# Patient Record
Sex: Female | Born: 1965 | Race: White | Hispanic: No | Marital: Married | Smoking: Never smoker
Health system: Southern US, Community
[De-identification: ages and names within clinical notes are randomized; demographics above are authoritative.]

## PROBLEM LIST (undated history)

## (undated) DIAGNOSIS — Z950 Presence of cardiac pacemaker: Secondary | ICD-10-CM

## (undated) DIAGNOSIS — I5022 Chronic systolic (congestive) heart failure: Secondary | ICD-10-CM

## (undated) DIAGNOSIS — N183 Chronic kidney disease, stage 3 unspecified: Secondary | ICD-10-CM

## (undated) DIAGNOSIS — I219 Acute myocardial infarction, unspecified: Secondary | ICD-10-CM

## (undated) DIAGNOSIS — I10 Essential (primary) hypertension: Secondary | ICD-10-CM

## (undated) DIAGNOSIS — I2510 Atherosclerotic heart disease of native coronary artery without angina pectoris: Secondary | ICD-10-CM

## (undated) DIAGNOSIS — Z95810 Presence of automatic (implantable) cardiac defibrillator: Secondary | ICD-10-CM

## (undated) DIAGNOSIS — F32A Depression, unspecified: Secondary | ICD-10-CM

## (undated) DIAGNOSIS — R7303 Prediabetes: Secondary | ICD-10-CM

## (undated) DIAGNOSIS — E039 Hypothyroidism, unspecified: Secondary | ICD-10-CM

## (undated) DIAGNOSIS — I959 Hypotension, unspecified: Secondary | ICD-10-CM

## (undated) DIAGNOSIS — I255 Ischemic cardiomyopathy: Secondary | ICD-10-CM

## (undated) DIAGNOSIS — D649 Anemia, unspecified: Secondary | ICD-10-CM

## (undated) DIAGNOSIS — I480 Paroxysmal atrial fibrillation: Secondary | ICD-10-CM

## (undated) DIAGNOSIS — I493 Ventricular premature depolarization: Secondary | ICD-10-CM

## (undated) HISTORY — DX: Atherosclerotic heart disease of native coronary artery without angina pectoris: I25.10

## (undated) HISTORY — DX: Hypotension, unspecified: I95.9

## (undated) HISTORY — DX: Ischemic cardiomyopathy: I25.5

## (undated) HISTORY — DX: Ventricular premature depolarization: I49.3

## (undated) HISTORY — PX: THYROIDECTOMY: SHX17

## (undated) HISTORY — DX: Chronic systolic (congestive) heart failure: I50.22

## (undated) HISTORY — DX: Prediabetes: R73.03

---

## 2015-08-25 DIAGNOSIS — I219 Acute myocardial infarction, unspecified: Secondary | ICD-10-CM

## 2015-08-25 HISTORY — DX: Acute myocardial infarction, unspecified: I21.9

## 2015-09-20 ENCOUNTER — Inpatient Hospital Stay (HOSPITAL_COMMUNITY): Payer: BLUE CROSS/BLUE SHIELD

## 2015-09-20 ENCOUNTER — Ambulatory Visit (HOSPITAL_COMMUNITY): Admit: 2015-09-20 | Payer: Self-pay | Admitting: Cardiovascular Disease

## 2015-09-20 ENCOUNTER — Inpatient Hospital Stay (HOSPITAL_COMMUNITY)
Admission: EM | Admit: 2015-09-20 | Discharge: 2015-09-26 | DRG: 246 | Disposition: A | Discharge status: Home / Self Care | Payer: BLUE CROSS/BLUE SHIELD | Attending: Cardiovascular Disease | Admitting: Cardiovascular Disease

## 2015-09-20 ENCOUNTER — Encounter (HOSPITAL_COMMUNITY)
Admission: EM | Disposition: A | Discharge status: Home / Self Care | Payer: Self-pay | Source: Home / Self Care | Attending: Cardiovascular Disease

## 2015-09-20 ENCOUNTER — Encounter (HOSPITAL_COMMUNITY): Payer: Self-pay | Admitting: Cardiovascular Disease

## 2015-09-20 DIAGNOSIS — I2510 Atherosclerotic heart disease of native coronary artery without angina pectoris: Secondary | ICD-10-CM

## 2015-09-20 DIAGNOSIS — E039 Hypothyroidism, unspecified: Secondary | ICD-10-CM

## 2015-09-20 DIAGNOSIS — E890 Postprocedural hypothyroidism: Secondary | ICD-10-CM | POA: Diagnosis present

## 2015-09-20 DIAGNOSIS — I110 Hypertensive heart disease with heart failure: Secondary | ICD-10-CM | POA: Diagnosis present

## 2015-09-20 DIAGNOSIS — I2102 ST elevation (STEMI) myocardial infarction involving left anterior descending coronary artery: Principal | ICD-10-CM

## 2015-09-20 DIAGNOSIS — I480 Paroxysmal atrial fibrillation: Secondary | ICD-10-CM | POA: Diagnosis not present

## 2015-09-20 DIAGNOSIS — I420 Dilated cardiomyopathy: Secondary | ICD-10-CM | POA: Diagnosis present

## 2015-09-20 DIAGNOSIS — Z955 Presence of coronary angioplasty implant and graft: Secondary | ICD-10-CM

## 2015-09-20 DIAGNOSIS — I2109 ST elevation (STEMI) myocardial infarction involving other coronary artery of anterior wall: Secondary | ICD-10-CM | POA: Diagnosis present

## 2015-09-20 DIAGNOSIS — I5021 Acute systolic (congestive) heart failure: Secondary | ICD-10-CM | POA: Diagnosis present

## 2015-09-20 DIAGNOSIS — Z8249 Family history of ischemic heart disease and other diseases of the circulatory system: Secondary | ICD-10-CM | POA: Diagnosis not present

## 2015-09-20 DIAGNOSIS — I255 Ischemic cardiomyopathy: Secondary | ICD-10-CM | POA: Diagnosis present

## 2015-09-20 DIAGNOSIS — R0789 Other chest pain: Secondary | ICD-10-CM | POA: Diagnosis present

## 2015-09-20 DIAGNOSIS — E038 Other specified hypothyroidism: Secondary | ICD-10-CM | POA: Diagnosis not present

## 2015-09-20 DIAGNOSIS — I213 ST elevation (STEMI) myocardial infarction of unspecified site: Secondary | ICD-10-CM

## 2015-09-20 DIAGNOSIS — Z793 Long term (current) use of hormonal contraceptives: Secondary | ICD-10-CM

## 2015-09-20 HISTORY — DX: Hypothyroidism, unspecified: E03.9

## 2015-09-20 HISTORY — PX: CORONARY STENT PLACEMENT: SHX1402

## 2015-09-20 HISTORY — PX: CARDIAC CATHETERIZATION: SHX172

## 2015-09-20 HISTORY — DX: Paroxysmal atrial fibrillation: I48.0

## 2015-09-20 LAB — TROPONIN I
Troponin I: >65 ng/mL
Troponin I: >65 ng/mL (ref <0.03)
Troponin I: >65 ng/mL

## 2015-09-20 LAB — POCT ACTIVATED CLOTTING TIME: Activated Clotting Time: 703 seconds

## 2015-09-20 LAB — CBC WITH DIFFERENTIAL/PLATELET
Basophils Absolute: 0 10*3/uL (ref 0.0–0.1)
Basophils Relative: 0 %
Eosinophils Absolute: 0 10*3/uL (ref 0.0–0.7)
Eosinophils Relative: 0 %
HCT: 37.6 % (ref 36.0–46.0)
Hemoglobin: 12.4 g/dL (ref 12.0–15.0)
Lymphocytes Relative: 9 %
Lymphs Abs: 1.1 10*3/uL (ref 0.7–4.0)
MCH: 28.9 pg (ref 26.0–34.0)
MCHC: 33 g/dL (ref 30.0–36.0)
MCV: 87.6 fL (ref 78.0–100.0)
Monocytes Absolute: 0.3 10*3/uL (ref 0.1–1.0)
Monocytes Relative: 2 %
Neutro Abs: 11.7 10*3/uL — ABNORMAL HIGH (ref 1.7–7.7)
Neutrophils Relative %: 89 %
Platelets: 266 10*3/uL (ref 150–400)
RBC: 4.29 MIL/uL (ref 3.87–5.11)
RDW: 13 % (ref 11.5–15.5)
WBC: 13.1 10*3/uL — ABNORMAL HIGH (ref 4.0–10.5)

## 2015-09-20 LAB — ECHOCARDIOGRAM COMPLETE
Height: 69 in
Weight: 2571.45 [oz_av]

## 2015-09-20 LAB — CBC
HCT: 35.2 % — ABNORMAL LOW (ref 36.0–46.0)
Hemoglobin: 11.7 g/dL — ABNORMAL LOW (ref 12.0–15.0)
MCH: 29.3 pg (ref 26.0–34.0)
MCHC: 33.2 g/dL (ref 30.0–36.0)
MCV: 88.2 fL (ref 78.0–100.0)
Platelets: 258 10*3/uL (ref 150–400)
RBC: 3.99 MIL/uL (ref 3.87–5.11)
RDW: 13.3 % (ref 11.5–15.5)
WBC: 13.4 10*3/uL — ABNORMAL HIGH (ref 4.0–10.5)

## 2015-09-20 LAB — BASIC METABOLIC PANEL
Anion gap: 13 (ref 5–15)
BUN: 11 mg/dL (ref 6–20)
CO2: 20 mmol/L — ABNORMAL LOW (ref 22–32)
Calcium: 8.1 mg/dL — ABNORMAL LOW (ref 8.9–10.3)
Chloride: 103 mmol/L (ref 101–111)
Creatinine, Ser: 0.76 mg/dL (ref 0.44–1.00)
GFR calc Af Amer: >60 mL/min (ref >60)
GFR calc non Af Amer: >60 mL/min (ref >60)
Glucose, Bld: 193 mg/dL — ABNORMAL HIGH (ref 65–99)
Potassium: 3.5 mmol/L (ref 3.5–5.1)
Sodium: 136 mmol/L (ref 135–145)

## 2015-09-20 LAB — MRSA PCR SCREENING: MRSA by PCR: NEGATIVE

## 2015-09-20 LAB — PROTIME-INR
INR: 1.12
Prothrombin Time: 14.4 seconds (ref 11.4–15.2)

## 2015-09-20 SURGERY — LEFT HEART CATH AND CORONARY ANGIOGRAPHY
Anesthesia: LOCAL

## 2015-09-20 MED ORDER — TIROFIBAN (AGGRASTAT) BOLUS VIA INFUSION
INTRAVENOUS | Status: DC | PRN
  Administered 2015-09-20: 1815 ug via INTRAVENOUS

## 2015-09-20 MED ORDER — METOCLOPRAMIDE HCL 5 MG/ML IJ SOLN
10.0000 mg | Freq: Once | INTRAMUSCULAR | Status: AC
  Administered 2015-09-20: 10 mg via INTRAVENOUS
  Filled 2015-09-20: qty 2

## 2015-09-20 MED ORDER — TIROFIBAN HCL IN NACL 5-0.9 MG/100ML-% IV SOLN
0.1500 ug/kg/min | INTRAVENOUS | Status: AC
  Administered 2015-09-20 (×2): 0.15 ug/kg/min via INTRAVENOUS
  Filled 2015-09-20 (×2): qty 100

## 2015-09-20 MED ORDER — FENTANYL CITRATE (PF) 100 MCG/2ML IJ SOLN
INTRAMUSCULAR | Status: AC
  Filled 2015-09-20: qty 2

## 2015-09-20 MED ORDER — FENTANYL CITRATE (PF) 100 MCG/2ML IJ SOLN
INTRAMUSCULAR | Status: DC | PRN
  Administered 2015-09-20 (×2): 25 ug via INTRAVENOUS

## 2015-09-20 MED ORDER — SODIUM CHLORIDE 0.9 % IV SOLN
INTRAVENOUS | Status: AC
  Administered 2015-09-20: 08:00:00 via INTRAVENOUS

## 2015-09-20 MED ORDER — SODIUM CHLORIDE 0.9% FLUSH: 3.0000 mL | INTRAVENOUS | Status: DC | PRN

## 2015-09-20 MED ORDER — LORAZEPAM 2 MG/ML IJ SOLN
0.5000 mg | Freq: Once | INTRAMUSCULAR | Status: AC
  Administered 2015-09-20: 0.5 mg via INTRAVENOUS

## 2015-09-20 MED ORDER — VERAPAMIL HCL 2.5 MG/ML IV SOLN
INTRAVENOUS | Status: AC
  Filled 2015-09-20: qty 2

## 2015-09-20 MED ORDER — ATORVASTATIN CALCIUM 80 MG PO TABS
80.0000 mg | ORAL_TABLET | Freq: Every day | ORAL | Status: DC
  Administered 2015-09-20 – 2015-09-25 (×6): 80 mg via ORAL
  Filled 2015-09-20 (×6): qty 1

## 2015-09-20 MED ORDER — NITROGLYCERIN 1 MG/10 ML FOR IR/CATH LAB
INTRA_ARTERIAL | Status: DC | PRN
  Administered 2015-09-20: 200 ug via INTRACORONARY

## 2015-09-20 MED ORDER — TICAGRELOR 90 MG PO TABS
90.0000 mg | ORAL_TABLET | Freq: Two times a day (BID) | ORAL | Status: DC
  Administered 2015-09-20 – 2015-09-26 (×12): 90 mg via ORAL
  Filled 2015-09-20 (×12): qty 1

## 2015-09-20 MED ORDER — SODIUM CHLORIDE 0.9% FLUSH
3.0000 mL | Freq: Two times a day (BID) | INTRAVENOUS | Status: DC
  Administered 2015-09-20 – 2015-09-25 (×10): 3 mL via INTRAVENOUS

## 2015-09-20 MED ORDER — HEPARIN (PORCINE) IN NACL 2-0.9 UNIT/ML-% IJ SOLN
INTRAMUSCULAR | Status: DC | PRN
  Administered 2015-09-20: 10 mL via INTRA_ARTERIAL

## 2015-09-20 MED ORDER — MORPHINE SULFATE (PF) 4 MG/ML IV SOLN
4.0000 mg | Freq: Once | INTRAVENOUS | Status: AC
  Administered 2015-09-20: 4 mg via INTRAVENOUS
  Filled 2015-09-20: qty 1

## 2015-09-20 MED ORDER — BIVALIRUDIN 250 MG IV SOLR
INTRAVENOUS | Status: AC
  Filled 2015-09-20: qty 250

## 2015-09-20 MED ORDER — IOPAMIDOL (ISOVUE-370) INJECTION 76%
INTRAVENOUS | Status: AC
  Filled 2015-09-20: qty 125

## 2015-09-20 MED ORDER — TICAGRELOR 90 MG PO TABS
ORAL_TABLET | ORAL | Status: DC | PRN
  Administered 2015-09-20: 180 mg via ORAL

## 2015-09-20 MED ORDER — TIROFIBAN HCL IN NACL 5-0.9 MG/100ML-% IV SOLN
INTRAVENOUS | Status: AC
  Filled 2015-09-20: qty 100

## 2015-09-20 MED ORDER — SODIUM CHLORIDE 0.9 % IV SOLN
INTRAVENOUS | Status: DC | PRN
  Administered 2015-09-20: 1.75 mg/kg/h via INTRAVENOUS

## 2015-09-20 MED ORDER — LISINOPRIL 2.5 MG PO TABS
2.5000 mg | ORAL_TABLET | Freq: Every day | ORAL | Status: DC
  Administered 2015-09-20 – 2015-09-23 (×4): 2.5 mg via ORAL
  Filled 2015-09-20 (×5): qty 1

## 2015-09-20 MED ORDER — HEPARIN (PORCINE) IN NACL 2-0.9 UNIT/ML-% IJ SOLN
INTRAMUSCULAR | Status: DC | PRN
  Administered 2015-09-20: 07:00:00

## 2015-09-20 MED ORDER — MIDAZOLAM HCL 2 MG/2ML IJ SOLN
INTRAMUSCULAR | Status: AC
  Filled 2015-09-20: qty 2

## 2015-09-20 MED ORDER — OXYCODONE-ACETAMINOPHEN 5-325 MG PO TABS
1.0000 | ORAL_TABLET | ORAL | Status: DC | PRN
  Filled 2015-09-20: qty 2

## 2015-09-20 MED ORDER — PERFLUTREN LIPID MICROSPHERE
INTRAVENOUS | Status: AC
Start: 2015-09-20 — End: 2015-09-20
  Administered 2015-09-20: 13:00:00
  Filled 2015-09-20: qty 10

## 2015-09-20 MED ORDER — ACETAMINOPHEN 325 MG PO TABS
650.0000 mg | ORAL_TABLET | ORAL | Status: DC | PRN
  Administered 2015-09-21 – 2015-09-25 (×7): 650 mg via ORAL
  Filled 2015-09-20 (×7): qty 2

## 2015-09-20 MED ORDER — CARVEDILOL 3.125 MG PO TABS
3.1250 mg | ORAL_TABLET | Freq: Two times a day (BID) | ORAL | Status: DC
  Administered 2015-09-20 – 2015-09-21 (×3): 3.125 mg via ORAL
  Filled 2015-09-20 (×3): qty 1

## 2015-09-20 MED ORDER — FENTANYL CITRATE (PF) 100 MCG/2ML IJ SOLN
25.0000 ug | INTRAMUSCULAR | Status: DC | PRN
  Administered 2015-09-20: 25 ug via INTRAVENOUS
  Filled 2015-09-20 (×2): qty 2

## 2015-09-20 MED ORDER — LORAZEPAM 2 MG/ML IJ SOLN
INTRAMUSCULAR | Status: AC
  Filled 2015-09-20: qty 1

## 2015-09-20 MED ORDER — IOPAMIDOL (ISOVUE-370) INJECTION 76%
INTRAVENOUS | Status: AC
  Filled 2015-09-20: qty 100

## 2015-09-20 MED ORDER — PERFLUTREN LIPID MICROSPHERE
1.0000 mL | INTRAVENOUS | Status: AC | PRN
  Administered 2015-09-20: 2 mL via INTRAVENOUS
  Filled 2015-09-20: qty 10

## 2015-09-20 MED ORDER — SODIUM CHLORIDE 0.9 % IV SOLN: 250.0000 mL | INTRAVENOUS | Status: DC | PRN

## 2015-09-20 MED ORDER — HEPARIN (PORCINE) IN NACL 2-0.9 UNIT/ML-% IJ SOLN
INTRAMUSCULAR | Status: DC | PRN
  Administered 2015-09-20: 1500 mL

## 2015-09-20 MED ORDER — GI COCKTAIL ~~LOC~~
30.0000 mL | Freq: Once | ORAL | Status: AC
  Administered 2015-09-20: 30 mL via ORAL
  Filled 2015-09-20: qty 30

## 2015-09-20 MED ORDER — IOPAMIDOL (ISOVUE-370) INJECTION 76%
INTRAVENOUS | Status: DC | PRN
  Administered 2015-09-20: 165 mL via INTRAVENOUS

## 2015-09-20 MED ORDER — LIDOCAINE HCL (PF) 1 % IJ SOLN
INTRAMUSCULAR | Status: DC | PRN
  Administered 2015-09-20: 2 mL

## 2015-09-20 MED ORDER — MORPHINE SULFATE (PF) 2 MG/ML IV SOLN
INTRAVENOUS | Status: AC
  Filled 2015-09-20: qty 1

## 2015-09-20 MED ORDER — BIVALIRUDIN BOLUS VIA INFUSION - CUPID
INTRAVENOUS | Status: DC | PRN
  Administered 2015-09-20: 54.45 mg via INTRAVENOUS

## 2015-09-20 MED ORDER — NITROGLYCERIN IN D5W 200-5 MCG/ML-% IV SOLN: 0.0000 ug/min | Freq: Once | INTRAVENOUS | Status: DC

## 2015-09-20 MED ORDER — IOPAMIDOL (ISOVUE-370) INJECTION 76%
INTRAVENOUS | Status: AC
  Filled 2015-09-20: qty 50

## 2015-09-20 MED ORDER — LORAZEPAM 2 MG/ML IJ SOLN
0.5000 mg | Freq: Once | INTRAMUSCULAR | Status: AC
  Administered 2015-09-20: 0.5 mg via INTRAVENOUS
  Filled 2015-09-20: qty 1

## 2015-09-20 MED ORDER — ASPIRIN 81 MG PO CHEW
81.0000 mg | CHEWABLE_TABLET | Freq: Every day | ORAL | Status: DC
  Administered 2015-09-21 – 2015-09-26 (×6): 81 mg via ORAL
  Filled 2015-09-20 (×6): qty 1

## 2015-09-20 MED ORDER — TIROFIBAN HCL IN NACL 5-0.9 MG/100ML-% IV SOLN
INTRAVENOUS | Status: DC | PRN
  Administered 2015-09-20: 0.15 ug/kg/min via INTRAVENOUS

## 2015-09-20 MED ORDER — ONDANSETRON HCL 4 MG/2ML IJ SOLN
4.0000 mg | Freq: Four times a day (QID) | INTRAMUSCULAR | Status: DC | PRN
  Administered 2015-09-20: 4 mg via INTRAVENOUS
  Filled 2015-09-20: qty 2

## 2015-09-20 MED ORDER — MORPHINE SULFATE (PF) 2 MG/ML IV SOLN
2.0000 mg | INTRAVENOUS | Status: DC | PRN
  Administered 2015-09-20 (×2): 2 mg via INTRAVENOUS
  Filled 2015-09-20: qty 1

## 2015-09-20 MED ORDER — MIDAZOLAM HCL 2 MG/2ML IJ SOLN
INTRAMUSCULAR | Status: DC | PRN
  Administered 2015-09-20 (×2): 1 mg via INTRAVENOUS

## 2015-09-20 MED ORDER — HEPARIN (PORCINE) IN NACL 2-0.9 UNIT/ML-% IJ SOLN
INTRAMUSCULAR | Status: AC
  Filled 2015-09-20: qty 1500

## 2015-09-20 MED ORDER — NITROGLYCERIN 1 MG/10 ML FOR IR/CATH LAB
INTRA_ARTERIAL | Status: AC
  Filled 2015-09-20: qty 10

## 2015-09-20 MED ORDER — MORPHINE SULFATE (PF) 2 MG/ML IV SOLN
INTRAVENOUS | Status: AC
  Administered 2015-09-20: 4 mg via INTRAVENOUS
  Filled 2015-09-20: qty 2

## 2015-09-20 MED ORDER — TICAGRELOR 90 MG PO TABS
ORAL_TABLET | ORAL | Status: AC
  Filled 2015-09-20: qty 2

## 2015-09-20 SURGICAL SUPPLY — 19 items
BALLN EMERGE MR 2.5X12 (BALLOONS) ×2
BALLN ~~LOC~~ EMERGE MR 3.75X12 (BALLOONS) ×2
BALLOON EMERGE MR 2.5X12 (BALLOONS) ×1 IMPLANT
BALLOON ~~LOC~~ EMERGE MR 3.75X12 (BALLOONS) ×1 IMPLANT
CATH INFINITI 5FR ANG PIGTAIL (CATHETERS) ×2 IMPLANT
CATH INFINITI JR4 5F (CATHETERS) ×2 IMPLANT
CATH VISTA GUIDE 6FR XBLAD3.5 (CATHETERS) ×2 IMPLANT
DEVICE RAD COMP TR BAND LRG (VASCULAR PRODUCTS) ×2 IMPLANT
GLIDESHEATH SLEND SS 6F .021 (SHEATH) ×2 IMPLANT
KIT ENCORE 26 ADVANTAGE (KITS) ×2 IMPLANT
KIT HEART LEFT (KITS) ×2 IMPLANT
PACK CARDIAC CATHETERIZATION (CUSTOM PROCEDURE TRAY) ×2 IMPLANT
STENT SYNERGY DES 3.5X16 (Permanent Stent) ×2 IMPLANT
SYR MEDRAD MARK V 150ML (SYRINGE) ×2 IMPLANT
TRANSDUCER W/STOPCOCK (MISCELLANEOUS) ×2 IMPLANT
TUBING CIL FLEX 10 FLL-RA (TUBING) ×2 IMPLANT
WIRE COUGAR XT STRL 190CM (WIRE) ×4 IMPLANT
WIRE HI TORQ WHISPER MS 190CM (WIRE) ×2 IMPLANT
WIRE SAFE-T 1.5MM-J .035X260CM (WIRE) ×2 IMPLANT

## 2015-09-20 NOTE — Progress Notes (Signed)
Notified Lillia Abed of patient c/o continuing chest pain, N/V and elevated Troponin >65.  Dr. Clifton James already aware of CP post procedure.  Lindsay at bedside new orders given and initiated.  Will continue to monitor and notify of any further changes.

## 2015-09-20 NOTE — H&P (Signed)
     Patient ID: Catherine Mays MRN: [id number] DOB/AGE: [birth year] 50 y.o. Admit date: 09/20/2015  Primary Care Physician:VASIREDDY,SABITHA, MD Primary Cardiologist: new  HPI: 50 yo female with no prior cardiac history presented to [facility] in [place] with c/o upper chest pain/arm pain, nausea and vomiting about 8 hours ago. Her pain was felt to be GI related. Troponin was negative. No EKG to review from that visit. She then went home and with continued chest pain, came back to the [facility] ED and EKG showed ST elevation in the lateral and precordial leads. Code STeMI activated and pt transported to [facility] for emergent cath with ongoing chest pain, although improved with morphine.   Review of systems complete and found to be negative unless listed above   Past Medical History:  Diagnosis Date  . Thyroid disease     Family History  Problem Relation Age of Onset  . Heart attack Father     Social History   Social History  . Marital status: Married    Spouse name: N/A  . Number of children: N/A  . Years of education: N/A   Occupational History  . Not on file.   Social History Main Topics  . Smoking status: Never Smoker  . Smokeless tobacco: Not on file  . Alcohol use No  . Drug use: No  . Sexual activity: Not on file   Other Topics Concern  . Not on file   Social History Narrative  . No narrative on file    Past Surgical History:  Procedure Laterality Date  . THYROIDECTOMY      No Known Allergies  Prior to Admission Meds:  Prior to Admission medications   Not on File   Synthroid 125 mcg po daily  Physical Exam: Height 5\' 9"  (1.753 m), weight 160 lb (72.6 kg).    General: Well developed, well nourished, NAD  HEENT: OP clear, mucus membranes moist  SKIN: warm, dry. No rashes.  Neuro: No focal deficits  Musculoskeletal: Muscle strength 5/5 all ext  Psychiatric: Mood and affect normal  Neck: No JVD, no carotid bruits, no thyromegaly, no lymphadenopathy.   Lungs:Clear bilaterally, no wheezes, rhonci, crackles  Cardiovascular: Regular rate and rhythm. No murmurs, gallops or rubs.  Abdomen:Soft. Bowel sounds present. Non-tender.  Extremities: No lower extremity edema. Pulses are 2 + in the bilateral DP/PT.   Labs: pending    EKG: sinus, rate 97 bpm. 2-3 mm ST elevation in leads 1, AVL, V2, V3, V4, V5.   ASSESSMENT AND PLAN:   1. Acute anterolateral STEMI: emergent cardiac cath. Further plans to follow after cath.   Earney Hamburg, MD 09/20/2015, 6:21 AM

## 2015-09-20 NOTE — ED Provider Notes (Addendum)
[facility]-EMERGENCY DEPT Provider Note   CSN: [id number] Arrival date & time: 09/20/15  4259  First Provider Contact:  None       History   Chief Complaint Chief Complaint  Patient presents with  . Code STEMI    HPI Catherine Mays is a 50 y.o. female.  HPI  This is a 50 year old female with history of hypertension and early family history of heart disease who presents as a transfer for from [facility] as a code STEMI. Patient is currently having chest pain 7 out of 10. At its worse it was 9 out of 10. She is on a heparin drip and has Nitropaste applied.  EKG from outside hospital reviewed. Anterior lateral ST elevation with ST depressions inferiorly noted. Patient is having ongoing pain.  Denies shortness of breath.  She has received aspirin and a 4000 unit bolus of heparin.  Level V caveat for acuity of condition  No past medical history on file.  There are no active problems to display for this patient.   No past surgical history on file.  OB History    No data available       Home Medications    Prior to Admission medications   Not on File    Family History No family history on file.  Social History Social History  Substance Use Topics  . Smoking status: Not on file  . Smokeless tobacco: Not on file  . Alcohol use Not on file     Allergies   Review of patient's allergies indicates no known allergies.   Review of Systems Review of Systems  Respiratory: Negative for shortness of breath.   Cardiovascular: Positive for chest pain.     Physical Exam Updated Vital Signs Ht  (1.753 m)   Wt 160 lb (72.6 kg)   BMI 23.63 kg/m   Physical Exam  Constitutional: She is oriented to person, place, and time. She appears well-developed and well-nourished. No distress.  Anxious appearing  HENT:  Head: Normocephalic and atraumatic.  Cardiovascular: Normal rate, regular rhythm, normal heart sounds and intact distal pulses.   Pulmonary/Chest:  Effort normal. No respiratory distress.  Neurological: She is alert and oriented to person, place, and time.  Skin: Skin is warm and dry.  Psychiatric: She has a normal mood and affect.  Nursing note and vitals reviewed.    ED Treatments / Results  Labs (all labs ordered are listed, but only abnormal results are displayed) Labs Reviewed  CBC WITH DIFFERENTIAL/PLATELET  BASIC METABOLIC PANEL  Beverlee Nims, ED    EKG  EKG Interpretation  Date/Time:  Thursday September 20 2015 05:59:44 EDT Ventricular Rate:  94 PR Interval:    QRS Duration: 102 QT Interval:  396 QTC Calculation: 496 R Axis:   -46 Text Interpretation:  Atrial fibrillation Left anterior fascicular block Anterolateral infarct, acute (LAD) Confirmed by HORTON  MD, COURTNEY ([id number]) on 09/20/2015 6:19:48 AM       Radiology No results found.  Procedures Procedures (including critical care time)  CRITICAL CARE Performed by: Shon Baton   Total critical care time: 15 minutes  Critical care time was exclusive of separately billable procedures and treating other patients.  Critical care was necessary to treat or prevent imminent or life-threatening deterioration.  Critical care was time spent personally by me on the following activities: development of treatment plan with patient and/or surrogate as well as nursing, discussions with consultants, evaluation of patient's response to treatment, examination of  patient, obtaining history from patient or surrogate, ordering and performing treatments and interventions, ordering and review of laboratory studies, ordering and review of radiographic studies, pulse oximetry and re-evaluation of patient's condition.   Medications Ordered in ED Medications  nitroGLYCERIN 50 mg in dextrose 5 % 250 mL (0.2 mg/mL) infusion (not administered)  morphine 4 MG/ML injection 4 mg (not administered)  LORazepam (ATIVAN) injection 0.5 mg (not administered)    LORazepam (ATIVAN) 2 MG/ML injection (not administered)  morphine 2 MG/ML injection (not administered)     Initial Impression / Assessment and Plan / ED Course  I have reviewed the triage vital signs and the nursing notes.  Pertinent labs & imaging results that were available during my care of the patient were reviewed by me and considered in my medical decision making (see chart for details).  Clinical Course    Patient presents as a transfer as a code STEMI. Continuing pain. Patient given morphine and Ativan. Nitroglycerin drip ordered. Patient taken emergently to the Cath Lab for cardiac catheterization.  Final Clinical Impressions(s) / ED Diagnoses   Final diagnoses:  ST elevation myocardial infarction (STEMI), unspecified artery [facility])    New Prescriptions New Prescriptions   No medications on file     Shon Baton, MD 09/20/15 6004    Shon Baton, MD 09/20/15 [phone]

## 2015-09-20 NOTE — ED Notes (Signed)
Dr Wilkie Aye aware of troponin, pt is in cathlab.

## 2015-09-20 NOTE — Progress Notes (Signed)
     Paged about patient continuing to have chest pain, along with nausea an vomiting post PCI. In talking with the patient she reports her pain has improved post PCI, no longer having right arm pain, but remains nauseated/vomited a couple of times with burning up into her throat. Will repeat EKG. Reglan x1, morphine changed to fentanyl, GI cocktail x1, and ativan 0.5mg  x1. Nursing staff informed of orders.    Laverda Page NP-C

## 2015-09-20 NOTE — ED Notes (Signed)
Patient taken to the cath lab 

## 2015-09-20 NOTE — ED Triage Notes (Signed)
Patient arrived via EMS from [facility].  Patient went to the hospital c/o continued chest pain.  Stated the Percocet that was given to her made her sick.  An EKG was done and was noted to be having a STEMI

## 2015-09-20 NOTE — ED Notes (Signed)
Patient received Heparin 4000 units and continuous drip at 1000 units/hr, 1 SL NTG and 1" NTG paste to left upper arm by [facility] and received Zofran 4mg  and 1 SL NTG by EMS

## 2015-09-20 NOTE — Progress Notes (Signed)
  Echocardiogram 2D Echocardiogram with Definity has been performed.  Catherine Mays 09/20/2015, 1:50 PM

## 2015-09-21 ENCOUNTER — Encounter (HOSPITAL_COMMUNITY): Payer: Self-pay | Admitting: Cardiovascular Disease

## 2015-09-21 ENCOUNTER — Encounter (HOSPITAL_COMMUNITY)
Admission: EM | Disposition: A | Discharge status: Home / Self Care | Payer: Self-pay | Source: Home / Self Care | Attending: Cardiovascular Disease

## 2015-09-21 HISTORY — PX: CARDIAC CATHETERIZATION: SHX172

## 2015-09-21 LAB — CBC
HCT: 36.1 % (ref 36.0–46.0)
HCT: 37 % (ref 36.0–46.0)
Hemoglobin: 12 g/dL (ref 12.0–15.0)
Hemoglobin: 11.9 g/dL — ABNORMAL LOW (ref 12.0–15.0)
MCH: 29.3 pg (ref 26.0–34.0)
MCH: 28.9 pg (ref 26.0–34.0)
MCHC: 33.2 g/dL (ref 30.0–36.0)
MCHC: 32.2 g/dL (ref 30.0–36.0)
MCV: 88 fL (ref 78.0–100.0)
MCV: 89.8 fL (ref 78.0–100.0)
Platelets: 256 10*3/uL (ref 150–400)
Platelets: 230 10*3/uL (ref 150–400)
RBC: 4.1 MIL/uL (ref 3.87–5.11)
RBC: 4.12 MIL/uL (ref 3.87–5.11)
RDW: 13.4 % (ref 11.5–15.5)
RDW: 13.4 % (ref 11.5–15.5)
WBC: 13.9 10*3/uL — ABNORMAL HIGH (ref 4.0–10.5)
WBC: 15.8 10*3/uL — ABNORMAL HIGH (ref 4.0–10.5)

## 2015-09-21 LAB — BASIC METABOLIC PANEL
Anion gap: 9 (ref 5–15)
BUN: 14 mg/dL (ref 6–20)
CO2: 22 mmol/L (ref 22–32)
Calcium: 7.8 mg/dL — ABNORMAL LOW (ref 8.9–10.3)
Chloride: 103 mmol/L (ref 101–111)
Creatinine, Ser: 0.82 mg/dL (ref 0.44–1.00)
GFR calc Af Amer: >60 mL/min (ref >60)
GFR calc non Af Amer: >60 mL/min (ref >60)
Glucose, Bld: 144 mg/dL — ABNORMAL HIGH (ref 65–99)
Potassium: 3.8 mmol/L (ref 3.5–5.1)
Sodium: 134 mmol/L — ABNORMAL LOW (ref 135–145)

## 2015-09-21 LAB — HEPATIC FUNCTION PANEL
ALT: 74 U/L — ABNORMAL HIGH (ref 14–54)
AST: 310 U/L — ABNORMAL HIGH (ref 15–41)
Albumin: 3.2 g/dL — ABNORMAL LOW (ref 3.5–5.0)
Alkaline Phosphatase: 55 U/L (ref 38–126)
Bilirubin, Direct: 0.1 mg/dL (ref 0.1–0.5)
Indirect Bilirubin: 0.6 mg/dL (ref 0.3–0.9)
Total Bilirubin: 0.7 mg/dL (ref 0.3–1.2)
Total Protein: 6.4 g/dL — ABNORMAL LOW (ref 6.5–8.1)

## 2015-09-21 LAB — CREATININE, SERUM
Creatinine, Ser: 0.77 mg/dL (ref 0.44–1.00)
GFR calc Af Amer: >60 mL/min (ref >60)
GFR calc non Af Amer: >60 mL/min (ref >60)

## 2015-09-21 SURGERY — LEFT HEART CATH AND CORONARY ANGIOGRAPHY
Anesthesia: LOCAL

## 2015-09-21 MED ORDER — HEPARIN (PORCINE) IN NACL 2-0.9 UNIT/ML-% IJ SOLN
INTRAMUSCULAR | Status: AC
  Filled 2015-09-21: qty 500

## 2015-09-21 MED ORDER — HEPARIN SODIUM (PORCINE) 5000 UNIT/ML IJ SOLN
5000.0000 [IU] | Freq: Three times a day (TID) | INTRAMUSCULAR | Status: DC
  Administered 2015-09-21 – 2015-09-23 (×5): 5000 [IU] via SUBCUTANEOUS
  Filled 2015-09-21 (×5): qty 1

## 2015-09-21 MED ORDER — CARVEDILOL 3.125 MG PO TABS: 3.1250 mg | ORAL_TABLET | Freq: Once | ORAL | Status: DC

## 2015-09-21 MED ORDER — IOPAMIDOL (ISOVUE-370) INJECTION 76%
INTRAVENOUS | Status: DC | PRN
  Administered 2015-09-21: 30 mL via INTRA_ARTERIAL

## 2015-09-21 MED ORDER — CARVEDILOL 6.25 MG PO TABS
6.2500 mg | ORAL_TABLET | Freq: Two times a day (BID) | ORAL | Status: DC
  Administered 2015-09-21 – 2015-09-26 (×10): 6.25 mg via ORAL
  Filled 2015-09-21 (×10): qty 1

## 2015-09-21 MED ORDER — LIDOCAINE HCL (PF) 1 % IJ SOLN
INTRAMUSCULAR | Status: DC | PRN
  Administered 2015-09-21: 2 mL

## 2015-09-21 MED ORDER — SODIUM CHLORIDE 0.9 % IV SOLN: 250.0000 mL | INTRAVENOUS | Status: DC | PRN

## 2015-09-21 MED ORDER — DOCUSATE SODIUM 100 MG PO CAPS
100.0000 mg | ORAL_CAPSULE | Freq: Every morning | ORAL | Status: DC
  Filled 2015-09-21 (×4): qty 1

## 2015-09-21 MED ORDER — FENTANYL CITRATE (PF) 100 MCG/2ML IJ SOLN
INTRAMUSCULAR | Status: DC | PRN
  Administered 2015-09-21: 25 ug
  Administered 2015-09-21: 25 ug via INTRAVENOUS

## 2015-09-21 MED ORDER — SODIUM CHLORIDE 0.9% FLUSH: 3.0000 mL | INTRAVENOUS | Status: DC | PRN

## 2015-09-21 MED ORDER — HEPARIN SODIUM (PORCINE) 1000 UNIT/ML IJ SOLN
INTRAMUSCULAR | Status: DC | PRN
  Administered 2015-09-21: 4000 [IU] via INTRAVENOUS

## 2015-09-21 MED ORDER — NITROGLYCERIN IN D5W 200-5 MCG/ML-% IV SOLN: 0.0000 ug/min | INTRAVENOUS | Status: DC

## 2015-09-21 MED ORDER — SODIUM CHLORIDE 0.9 % IV SOLN: INTRAVENOUS | Status: AC

## 2015-09-21 MED ORDER — VERAPAMIL HCL 2.5 MG/ML IV SOLN
INTRAVENOUS | Status: DC | PRN
  Administered 2015-09-21: 10 mL via INTRA_ARTERIAL

## 2015-09-21 MED ORDER — ASPIRIN 81 MG PO CHEW
324.0000 mg | CHEWABLE_TABLET | Freq: Once | ORAL | Status: DC
Start: 2015-09-21 — End: 2015-09-26

## 2015-09-21 MED ORDER — FENTANYL CITRATE (PF) 100 MCG/2ML IJ SOLN
INTRAMUSCULAR | Status: AC
  Filled 2015-09-21: qty 2

## 2015-09-21 MED ORDER — HEPARIN (PORCINE) IN NACL 2-0.9 UNIT/ML-% IJ SOLN
INTRAMUSCULAR | Status: DC | PRN
  Administered 2015-09-21: 1500 mL

## 2015-09-21 MED ORDER — CALCIUM + D3 600-200 MG-UNIT PO TABS: ORAL_TABLET | Freq: Every morning | ORAL | Status: DC

## 2015-09-21 MED ORDER — MIDAZOLAM HCL 2 MG/2ML IJ SOLN
INTRAMUSCULAR | Status: AC
  Filled 2015-09-21: qty 2

## 2015-09-21 MED ORDER — NITROGLYCERIN 1 MG/10 ML FOR IR/CATH LAB
INTRA_ARTERIAL | Status: AC
  Filled 2015-09-21: qty 10

## 2015-09-21 MED ORDER — MIDAZOLAM HCL 2 MG/2ML IJ SOLN
INTRAMUSCULAR | Status: DC | PRN
  Administered 2015-09-21: 2 mg via INTRAVENOUS

## 2015-09-21 MED ORDER — SODIUM CHLORIDE 0.9% FLUSH
3.0000 mL | Freq: Two times a day (BID) | INTRAVENOUS | Status: DC
  Administered 2015-09-21 – 2015-09-25 (×8): 3 mL via INTRAVENOUS

## 2015-09-21 MED ORDER — IOPAMIDOL (ISOVUE-370) INJECTION 76%
INTRAVENOUS | Status: AC
  Filled 2015-09-21: qty 125

## 2015-09-21 MED ORDER — LEVOTHYROXINE SODIUM 25 MCG PO TABS
125.0000 ug | ORAL_TABLET | Freq: Every day | ORAL | Status: DC
  Administered 2015-09-21 – 2015-09-26 (×6): 125 ug via ORAL
  Filled 2015-09-21 (×6): qty 1

## 2015-09-21 MED ORDER — VERAPAMIL HCL 2.5 MG/ML IV SOLN
INTRAVENOUS | Status: AC
  Filled 2015-09-21: qty 2

## 2015-09-21 MED ORDER — HEPARIN (PORCINE) IN NACL 100-0.45 UNIT/ML-% IJ SOLN
INTRAMUSCULAR | Status: AC
  Filled 2015-09-21: qty 250

## 2015-09-21 MED ORDER — HEPARIN SODIUM (PORCINE) 1000 UNIT/ML IJ SOLN
INTRAMUSCULAR | Status: AC
  Filled 2015-09-21: qty 1

## 2015-09-21 MED ORDER — DESOGESTREL-ETHINYL ESTRADIOL 0.15-30 MG-MCG PO TABS: 1.0000 | ORAL_TABLET | Freq: Every day | ORAL | Status: DC

## 2015-09-21 MED ORDER — TRIAMCINOLONE ACETONIDE 55 MCG/ACT NA AERO
2.0000 | INHALATION_SPRAY | Freq: Every day | NASAL | Status: DC
  Administered 2015-09-22 – 2015-09-25 (×4): 2 via NASAL
  Filled 2015-09-21 (×2): qty 21.6

## 2015-09-21 MED ORDER — LIDOCAINE HCL (PF) 1 % IJ SOLN
INTRAMUSCULAR | Status: AC
  Filled 2015-09-21: qty 30

## 2015-09-21 MED ORDER — PANTOPRAZOLE SODIUM 40 MG PO TBEC
40.0000 mg | DELAYED_RELEASE_TABLET | Freq: Every day | ORAL | Status: DC
  Administered 2015-09-22 – 2015-09-26 (×5): 40 mg via ORAL
  Filled 2015-09-21 (×6): qty 1

## 2015-09-21 MED ORDER — SODIUM CHLORIDE 0.9 % IV SOLN
INTRAVENOUS | Status: DC | PRN
  Administered 2015-09-21: 75 mL/h via INTRAVENOUS

## 2015-09-21 MED ORDER — CALCIUM CARBONATE-VITAMIN D 500-200 MG-UNIT PO TABS
1.0000 | ORAL_TABLET | Freq: Every day | ORAL | Status: DC
  Administered 2015-09-21 – 2015-09-26 (×6): 1 via ORAL
  Filled 2015-09-21 (×6): qty 1

## 2015-09-21 SURGICAL SUPPLY — 10 items
CATH INFINITI 5 FR JL3.5 (CATHETERS) ×2 IMPLANT
CATH INFINITI JR4 5F (CATHETERS) ×2 IMPLANT
DEVICE RAD COMP TR BAND LRG (VASCULAR PRODUCTS) ×2 IMPLANT
GLIDESHEATH SLEND SS 6F .021 (SHEATH) ×2 IMPLANT
KIT ENCORE 26 ADVANTAGE (KITS) ×2 IMPLANT
KIT HEART LEFT (KITS) ×2 IMPLANT
PACK CARDIAC CATHETERIZATION (CUSTOM PROCEDURE TRAY) ×2 IMPLANT
TRANSDUCER W/STOPCOCK (MISCELLANEOUS) ×2 IMPLANT
TUBING CIL FLEX 10 FLL-RA (TUBING) ×2 IMPLANT
WIRE SAFE-T 1.5MM-J .035X260CM (WIRE) ×2 IMPLANT

## 2015-09-21 NOTE — Progress Notes (Signed)
EKG CRITICAL VALUE     12 lead EKG performed.  Critical value noted.  Alinda Money, RN notified.   Wandalee Ferdinand, [place] 09/21/2015 8:48 AM

## 2015-09-21 NOTE — Care Management Note (Addendum)
Case Management Note  Patient Details  Name: Catherine Mays MRN: [id number] Date of Birth: 05-Oct-1965  Subjective/Objective:         Pt admitted with Code STEMI           Action/Plan:  PTA independent from home with spouse.  Per MAR pt was started on Brilinta - CM submitted benefit check.  Both free 30 day card and copay card given to pt - CM called pharmacy of choice [facility] in [place] and was informed that medication is available for pick up.  CM will continue to follow for discharge needs  Benefit Check BRILINTA 90 MG BID (30) 60 TAB   COVER- YES  CO-PAY - $ 30 .00  PRIOR APPROVAL - NO  [facility]ACY: [facility], [facility], [facility] AND [facility]  Information shared with both pt and husband   Expected Discharge Date:                  Expected Discharge Plan:  Home/Self Care  In-House Referral:     Discharge planning Services  CM Consult  Post Acute Care Choice:    Choice offered to:     DME Arranged:    DME Agency:     HH Arranged:    HH Agency:     Status of Service:  In process, will continue to follow  If discussed at Long Length of Stay Meetings, dates discussed:    Additional Comments:  Cherylann Parr, RN 09/21/2015, 10:09 AM

## 2015-09-21 NOTE — Progress Notes (Signed)
Patient Name: Catherine Mays Date of Encounter: 09/21/2015  Active Problems:   Acute ST elevation myocardial infarction (STEMI) involving left anterior descending coronary artery (HCC)   Acute anterior wall MI [facility])   Primary Cardiologist: New Patient Profile: 50 year old female with no prior cardiac history. She presented to [facility] on 09/20/15 with chest pain with radiation to her right and left arms with associated nausea and vomiting. She initially was sent home but returned to the ED at [facility] a few hours later with worsening chest pain, EKG showed ST elevation in lateral and precordial leads. Code STEMI was activated, she received a DES to her LAD on 09/20/15.   SUBJECTIVE: Still with chest pain, 3/10, diaphoretic.   OBJECTIVE Vitals:   09/21/15 0600 09/21/15 0700 09/21/15 0800 09/21/15 1000  BP: 118/83 (!) 124/91  127/90  Pulse: (!) 103 (!) 112  (!) 117  Resp: 19 (!) 30  (!) 22  Temp:   98.5 F (36.9 C)   TempSrc:   Oral   SpO2: 95% 97%  93%  Weight:      Height:        Intake/Output Summary (Last 24 hours) at 09/21/15 1143 Last data filed at 09/21/15 0200  Gross per 24 hour  Intake            901.5 ml  Output              200 ml  Net            701.5 ml   Filed Weights   09/20/15 0609 09/20/15 0800  Weight: 160 lb (72.6 kg) 160 lb 11.5 oz (72.9 kg)    PHYSICAL EXAM General: Well developed, well nourished, female , diaphoretic.  Head: Normocephalic, atraumatic.  Neck: Supple without bruits, no JVD. Lungs:  Resp regular and unlabored, CTA. Heart: RRR, S1, S2, no S3, S4, or murmur; no rub. Abdomen: Soft, non-tender, non-distended, BS + x 4.  Extremities: No clubbing, cyanosis,no edema.  Neuro: Alert and oriented X 3. Moves all extremities spontaneously. Psych: Normal affect.  LABS: CBC: Recent Labs  09/20/15 0606 09/20/15 1716 09/21/15 0333  WBC 13.1* 13.4* 13.9*  NEUTROABS 11.7*  --   --   HGB 12.4 11.7* 12.0  HCT 37.6 35.2*  36.1  MCV 87.6 88.2 88.0  PLT 266 258 256   INR: Recent Labs  09/20/15 0606  INR 1.12   Basic Metabolic Panel: Recent Labs  09/20/15 0606 09/21/15 0333  NA 136 134*  K 3.5 3.8  CL 103 103  CO2 20* 22  GLUCOSE 193* 144*  BUN 11 14  CREATININE 0.76 0.82  CALCIUM 8.1* 7.8*   Liver Function Tests: Recent Labs  09/21/15 0333  AST 310*  ALT 74*  ALKPHOS 55  BILITOT 0.7  PROT 6.4*  ALBUMIN 3.2*   Cardiac Enzymes: Recent Labs  09/20/15 1138 09/20/15 1716 09/20/15 2258  TROPONINI >65.00* >65.00* >65.00*     Current Facility-Administered Medications:  .  [MAR Hold] 0.9 %  sodium chloride infusion, 250 mL, Intravenous, PRN, Kathleene Hazel, MD, Last Rate: 10 mL/hr at 09/20/15 1800, 250 mL at 09/20/15 1800 .  [MAR Hold] acetaminophen (TYLENOL) tablet 650 mg, 650 mg, Oral, Q4H PRN, Kathleene Hazel, MD, 650 mg at 09/21/15 1002 .  [MAR Hold] aspirin chewable tablet 324 mg, 324 mg, Oral, Once, Kathleene Hazel, MD .  Mitzi Hansen Hold] aspirin chewable tablet 81 mg, 81 mg, Oral, Daily, Nile Dear  McAlhany, MD, 81 mg at 09/21/15 0955 .  [MAR Hold] atorvastatin (LIPITOR) tablet 80 mg, 80 mg, Oral, q1800, Kathleene Hazel, MD, 80 mg at 09/20/15 1713 .  [MAR Hold] calcium-vitamin D (OSCAL WITH D) 500-200 MG-UNIT per tablet 1 tablet, 1 tablet, Oral, Q breakfast, Kathleene Hazel, MD .  Mitzi Hansen Hold] carvedilol (COREG) tablet 6.25 mg, 6.25 mg, Oral, BID WC, Rhonda G Barrett, PA-C .  [MAR Hold] desogestrel-ethinyl estradiol (APRI,EMOQUETTE,SOLIA) 0.15-30 MG-MCG per tablet 1 tablet, 1 tablet, Oral, Daily, Rhonda G Barrett, PA-C .  [MAR Hold] docusate sodium (COLACE) capsule 100 mg, 100 mg, Oral, q morning - 10a, Rhonda G Barrett, PA-C .  [MAR Hold] fentaNYL (SUBLIMAZE) injection 25 mcg, 25 mcg, Intravenous, Q2H PRN, Arty Baumgartner, NP, 25 mcg at 09/20/15 1828 .  heparin 100-0.45 UNIT/ML-% infusion, , , ,  .  [MAR Hold] levothyroxine (SYNTHROID, LEVOTHROID)  tablet 125 mcg, 125 mcg, Oral, QAC breakfast, Rhonda G Barrett, PA-C .  [MAR Hold] lisinopril (PRINIVIL,ZESTRIL) tablet 2.5 mg, 2.5 mg, Oral, Daily, Kathleene Hazel, MD, 2.5 mg at 09/21/15 0955 .  [MAR Hold] nitroGLYCERIN 50 mg in dextrose 5 % 250 mL (0.2 mg/mL) infusion, 0-200 mcg/min, Intravenous, Titrated, Little Ishikawa, NP .  Mitzi Hansen Hold] ondansetron [facility]) injection 4 mg, 4 mg, Intravenous, Q6H PRN, Kathleene Hazel, MD, 4 mg at 09/20/15 1615 .  [MAR Hold] oxyCODONE-acetaminophen (PERCOCET/ROXICET) 5-325 MG per tablet 1-2 tablet, 1-2 tablet, Oral, Q4H PRN, Kathleene Hazel, MD .  Mitzi Hansen Hold] pantoprazole (PROTONIX) EC tablet 40 mg, 40 mg, Oral, Daily, Rhonda G Barrett, PA-C .  [MAR Hold] sodium chloride flush (NS) 0.9 % injection 3 mL, 3 mL, Intravenous, Q12H, Kathleene Hazel, MD, 3 mL at 09/21/15 0957 .  [MAR Hold] sodium chloride flush (NS) 0.9 % injection 3 mL, 3 mL, Intravenous, PRN, Kathleene Hazel, MD .  Mitzi Hansen Hold] ticagrelor [facility]) tablet 90 mg, 90 mg, Oral, BID, Kathleene Hazel, MD, 90 mg at 09/21/15 0955 .  [MAR Hold] triamcinolone (NASACORT) nasal inhaler 2 spray, 2 spray, Nasal, Daily, Rhonda G Barrett, PA-C  Facility-Administered Medications Ordered in Other Encounters:  .  bivalirudin (ANGIOMAX) 250 mg in sodium chloride 0.9 % 50 mL (5 mg/mL) infusion, , , Continuous PRN, Kathleene Hazel, MD, Stopped at 09/20/15 [phone] .  bivalirudin (ANGIOMAX) BOLUS via infusion, , , PRN, Kathleene Hazel, MD, 54.45 mg at 09/20/15 9604 .  fentaNYL (SUBLIMAZE) injection, , , PRN, Kathleene Hazel, MD, 25 mcg at 09/20/15 0651 .  heparin 1,500 mL, , , PRN, Kathleene Hazel, MD .  heparin infusion 2 units/mL in 0.9 % sodium chloride, , , Continuous PRN, Kathleene Hazel, MD, 1,500 mL at 09/20/15 0733 .  iopamidol (ISOVUE-370) 76 % injection, , , PRN, Kathleene Hazel, MD, 165 mL at 09/20/15 0733 .  lidocaine (PF) (XYLOCAINE)  1 % injection, , , PRN, Kathleene Hazel, MD, 2 mL at 09/20/15 5409 .  midazolam (VERSED) injection, , , PRN, Kathleene Hazel, MD, 1 mg at 09/20/15 0651 .  nitroGLYCERIN 1 mg/10 ml (100 mcg/ml) - IR/CATH LAB, , , PRN, Kathleene Hazel, MD, 200 mcg at 09/20/15 8119 .  Radial Cocktail/Verapamil only, , , PRN, Kathleene Hazel, MD, 10 mL at 09/20/15 0629 .  ticagrelor (BRILINTA) tablet, , , PRN, Kathleene Hazel, MD, 180 mg at 09/20/15 [phone] .  tirofiban (AGGRASTAT) bolus via infusion, , , PRN, Kathleene Hazel, MD, 1,815 mcg at  09/20/15 8657 .  tirofiban (AGGRASTAT) infusion 50 mcg/mL 100 mL, , , Continuous PRN, Kathleene Hazel, MD, Last Rate: 13.1 mL/hr at 09/20/15 0651, 0.15 mcg/kg/min at 09/20/15 0651 . [MAR Hold] nitroGLYCERIN      TELE:    NSR, ST elevation    ECG: ST elevation in anterolateral leads with reciprocal depression in anterior leads.   Coronary Stent Intervention  Left Heart Cath and Coronary Angiography 09/20/15    Mid RCA lesion, 20 %stenosed.  A STENT SYNERGY DES 3.5X16 drug eluting stent was successfully placed.  Mid LAD lesion, 100 %stenosed.  Post intervention, there is a 0% residual stenosis.  The left ventricular ejection fraction is 25-35% by visual estimate.  LV end diastolic pressure is moderately elevated.  There is moderate to severe left ventricular systolic dysfunction.  There is no mitral valve regurgitation.   1. Acute anterior STEMI secondary to occluded proximal to mid LAD 2. Successful PTCA/DES x 1 proximal to mid LAD 3. Mild non-obstructive disease RCA 4. Severe LV systolic dysfunction.   Recommendations: Will admit to ICU. Echo tomorrow. Will continue Aggrastat for 18 hours. Will contunue DAPT with ASA, Brilinta for one year. Will start low dose Coreg this am as well as high intensity statin. Will start Ace-inh as BP tolerates. She will likely need a Lifevest before discharge given the severe  anterior wall dysfunction noted on LV gram.    Current Medications:  . [MAR Hold] aspirin  324 mg Oral Once  . [MAR Hold] aspirin  81 mg Oral Daily  . [MAR Hold] atorvastatin  80 mg Oral q1800  . [MAR Hold] calcium-vitamin D  1 tablet Oral Q breakfast  . [MAR Hold] carvedilol  6.25 mg Oral BID WC  . [MAR Hold] desogestrel-ethinyl estradiol  1 tablet Oral Daily  . [MAR Hold] docusate sodium  100 mg Oral q morning - 10a  . heparin      . [MAR Hold] levothyroxine  125 mcg Oral QAC breakfast  . [MAR Hold] lisinopril  2.5 mg Oral Daily  . [MAR Hold] pantoprazole  40 mg Oral Daily  . [MAR Hold] sodium chloride flush  3 mL Intravenous Q12H  . [MAR Hold] ticagrelor  90 mg Oral BID  . [MAR Hold] triamcinolone  2 spray Nasal Daily   . [MAR Hold] nitroGLYCERIN      ASSESSMENT AND PLAN: Active Problems:   Acute ST elevation myocardial infarction (STEMI) involving left anterior descending coronary artery (HCC)   Acute anterior wall MI (HCC)   1. STEMI involving left anterior descending coronary artery: Patient had DES to LAD yesterday, had some nausea and residual pain last pm. Upon assessment today, she is diaphoretic and continues to have pain. She was virtually pain free this am around 7am, but developed pain and general malaise. EKG shows ST elevation in anterolateral leads with reciprocal changes in inferior leads. ST elevation this am 2-3 mm, worsened from last pm. Code STEMI called. She was urgently brought to the cath lab.   Signed, Little Ishikawa , NP 11:43 AM 09/21/2015 Pager [phone]  Patient seen, examined. Available data reviewed. Agree with findings, assessment, and plan as outlined by Suzzette Righter, NP. The patient is evaluated on her arrival in the cardiac catheterization lab. She has residual chest pain and an increase in her ST elevation. She will undergo emergency cardiac catheterization and possible angioplasty/stenting to evaluate for the potential of acute stent  thrombosis.  Tonny Bollman, M.D. 09/21/2015 12:09 PM

## 2015-09-22 MED ORDER — SPIRONOLACTONE 25 MG PO TABS
12.5000 mg | ORAL_TABLET | Freq: Every day | ORAL | Status: DC
  Administered 2015-09-22 – 2015-09-23 (×2): 12.5 mg via ORAL
  Filled 2015-09-22 (×2): qty 1

## 2015-09-22 MED ORDER — CETYLPYRIDINIUM CHLORIDE 0.05 % MT LIQD
7.0000 mL | Freq: Two times a day (BID) | OROMUCOSAL | Status: DC
  Administered 2015-09-23 – 2015-09-25 (×4): 7 mL via OROMUCOSAL

## 2015-09-22 NOTE — Progress Notes (Signed)
12 EKG showed increasing ST elevation in V3-V5.  Pt denies chest pain, VSS.  Bailey Mech, NP on floor and notified.  No new orders at this time as patient is asymptomatic and cath yesterday showed patent stent.  Will continue to monitor.

## 2015-09-22 NOTE — Progress Notes (Signed)
Patient Name: Catherine Mays Date of Encounter: 09/22/2015  Active Problems:   Acute ST elevation myocardial infarction (STEMI) involving left anterior descending coronary artery (HCC)   Acute anterior wall MI [facility])   Primary Cardiologist: New Patient Profile: 50 year old female with no prior cardiac history. She presented to [facility] on 09/20/15 with chest pain with radiation to her right and left arms with associated nausea and vomiting. She initially was sent home but returned to the ED at [facility] a few hours later with worsening chest pain, EKG showed ST elevation in lateral and precordial leads. Code STEMI was activated, she received a DES to her LAD on 09/20/15.   SUBJECTIVE:   Underwent relook cath yesterday for ongoing CP and persistent ST elevation. LAD stent patent. Feels better today. No CP.  Persistent ST elevation on ECG.  EF 30-35% on echo. SBP 90s.     OBJECTIVE Vitals:   09/22/15 0744 09/22/15 0805 09/22/15 1000 09/22/15 1100  BP: 111/76 109/76 94/71 98/79   Pulse: (!) 110 (!) 101 91 87  Resp: 20 (!) 24 19 (!) 22  Temp: 98.8 F (37.1 C)     TempSrc: Oral     SpO2: 93% 93% 93% 95%  Weight:      Height:        Intake/Output Summary (Last 24 hours) at 09/22/15 1214 Last data filed at 09/22/15 0800  Gross per 24 hour  Intake              640 ml  Output              600 ml  Net               40 ml   Filed Weights   09/20/15 0609 09/20/15 0800  Weight: 72.6 kg (160 lb) 72.9 kg (160 lb 11.5 oz)    PHYSICAL EXAM General: Well developed, well nourished, female , sitting in bed NAD Head: Normocephalic, atraumatic.  Neck: Supple without bruits, no JVD. Lungs:  Resp regular and unlabored, CTA. Heart: RRR, S1, S2, no S3, S4, or murmur; no rub. Abdomen: Soft, non-tender, non-distended, BS + x 4.  Extremities: No clubbing, cyanosis,no edema.  Neuro: Alert and oriented X 3. Moves all extremities spontaneously. Psych: Normal  affect.  LABS: CBC: Recent Labs  09/20/15 0606  09/21/15 0333 09/21/15 1257  WBC 13.1*  < > 13.9* 15.8*  NEUTROABS 11.7*  --   --   --   HGB 12.4  < > 12.0 11.9*  HCT 37.6  < > 36.1 37.0  MCV 87.6  < > 88.0 89.8  PLT 266  < > 256 230  < > = values in this interval not displayed. INR:  Recent Labs  09/20/15 0606  INR 1.12   Basic Metabolic Panel:  Recent Labs  09/81/19 0606 09/21/15 0333 09/21/15 1257  NA 136 134*  --   K 3.5 3.8  --   CL 103 103  --   CO2 20* 22  --   GLUCOSE 193* 144*  --   BUN 11 14  --   CREATININE 0.76 0.82 0.77  CALCIUM 8.1* 7.8*  --    Liver Function Tests:  Recent Labs  09/21/15 0333  AST 310*  ALT 74*  ALKPHOS 55  BILITOT 0.7  PROT 6.4*  ALBUMIN 3.2*   Cardiac Enzymes:  Recent Labs  09/20/15 1138 09/20/15 1716 09/20/15 2258  TROPONINI >65.00* >65.00* >65.00*  Current Facility-Administered Medications:  .  0.9 %  sodium chloride infusion, 250 mL, Intravenous, PRN, Kathleene Hazel, MD, Last Rate: 10 mL/hr at 09/20/15 1800, 250 mL at 09/20/15 1800 .  0.9 %  sodium chloride infusion, 250 mL, Intravenous, PRN, Tonny Bollman, MD .  acetaminophen (TYLENOL) tablet 650 mg, 650 mg, Oral, Q4H PRN, Kathleene Hazel, MD, 650 mg at 09/22/15 0646 .  antiseptic oral rinse (CPC / CETYLPYRIDINIUM CHLORIDE 0.05%) solution 7 mL, 7 mL, Mouth Rinse, BID, Kathleene Hazel, MD .  aspirin chewable tablet 324 mg, 324 mg, Oral, Once, Kathleene Hazel, MD .  aspirin chewable tablet 81 mg, 81 mg, Oral, Daily, Kathleene Hazel, MD, 81 mg at 09/22/15 1035 .  atorvastatin (LIPITOR) tablet 80 mg, 80 mg, Oral, q1800, Kathleene Hazel, MD, 80 mg at 09/21/15 1700 .  calcium-vitamin D (OSCAL WITH D) 500-200 MG-UNIT per tablet 1 tablet, 1 tablet, Oral, Q breakfast, Kathleene Hazel, MD, 1 tablet at 09/22/15 [phone]  carvedilol (COREG) tablet 6.25 mg, 6.25 mg, Oral, BID WC, Rhonda G Barrett, PA-C, 6.25 mg at  09/22/15 0806 .  desogestrel-ethinyl estradiol (APRI,EMOQUETTE,SOLIA) 0.15-30 MG-MCG per tablet 1 tablet, 1 tablet, Oral, Daily, Rhonda G Barrett, PA-C .  docusate sodium (COLACE) capsule 100 mg, 100 mg, Oral, q morning - 10a, Rhonda G Barrett, PA-C .  fentaNYL (SUBLIMAZE) injection 25 mcg, 25 mcg, Intravenous, Q2H PRN, Arty Baumgartner, NP, 25 mcg at 09/20/15 1828 .  heparin injection 5,000 Units, 5,000 Units, Subcutaneous, Q8H, Tonny Bollman, MD, 5,000 Units at 09/22/15 0553 .  levothyroxine (SYNTHROID, LEVOTHROID) tablet 125 mcg, 125 mcg, Oral, QAC breakfast, Joline Salt Barrett, PA-C, 125 mcg at 09/22/15 0805 .  lisinopril (PRINIVIL,ZESTRIL) tablet 2.5 mg, 2.5 mg, Oral, Daily, Kathleene Hazel, MD, 2.5 mg at 09/21/15 0955 .  ondansetron (ZOFRAN) injection 4 mg, 4 mg, Intravenous, Q6H PRN, Kathleene Hazel, MD, 4 mg at 09/20/15 1615 .  oxyCODONE-acetaminophen (PERCOCET/ROXICET) 5-325 MG per tablet 1-2 tablet, 1-2 tablet, Oral, Q4H PRN, Kathleene Hazel, MD .  pantoprazole (PROTONIX) EC tablet 40 mg, 40 mg, Oral, Daily, Rhonda G Barrett, PA-C, 40 mg at 09/22/15 1035 .  sodium chloride flush (NS) 0.9 % injection 3 mL, 3 mL, Intravenous, Q12H, Kathleene Hazel, MD, 3 mL at 09/22/15 1037 .  sodium chloride flush (NS) 0.9 % injection 3 mL, 3 mL, Intravenous, PRN, Kathleene Hazel, MD .  sodium chloride flush (NS) 0.9 % injection 3 mL, 3 mL, Intravenous, Q12H, Tonny Bollman, MD, 3 mL at 09/22/15 1037 .  sodium chloride flush (NS) 0.9 % injection 3 mL, 3 mL, Intravenous, PRN, Tonny Bollman, MD .  ticagrelor [facility]) tablet 90 mg, 90 mg, Oral, BID, Kathleene Hazel, MD, 90 mg at 09/22/15 1035 .  triamcinolone (NASACORT) nasal inhaler 2 spray, 2 spray, Nasal, Daily, Rhonda G Barrett, PA-C  Facility-Administered Medications Ordered in Other Encounters:  .  bivalirudin (ANGIOMAX) 250 mg in sodium chloride 0.9 % 50 mL (5 mg/mL) infusion, , , Continuous PRN, Kathleene Hazel, MD, Stopped at 09/20/15 [phone]  bivalirudin (ANGIOMAX) BOLUS via infusion, , , PRN, Kathleene Hazel, MD, 54.45 mg at 09/20/15 3545 .  fentaNYL (SUBLIMAZE) injection, , , PRN, Kathleene Hazel, MD, 25 mcg at 09/20/15 0651 .  heparin 1,500 mL, , , PRN, Kathleene Hazel, MD .  heparin infusion 2 units/mL in 0.9 % sodium chloride, , , Continuous PRN, Kathleene Hazel, MD, 1,500 mL  at 09/20/15 0733 .  iopamidol (ISOVUE-370) 76 % injection, , , PRN, Kathleene Hazel, MD, 165 mL at 09/20/15 0733 .  lidocaine (PF) (XYLOCAINE) 1 % injection, , , PRN, Kathleene Hazel, MD, 2 mL at 09/20/15 1610 .  midazolam (VERSED) injection, , , PRN, Kathleene Hazel, MD, 1 mg at 09/20/15 0651 .  nitroGLYCERIN 1 mg/10 ml (100 mcg/ml) - IR/CATH LAB, , , PRN, Kathleene Hazel, MD, 200 mcg at 09/20/15 9604 .  Radial Cocktail/Verapamil only, , , PRN, Kathleene Hazel, MD, 10 mL at 09/20/15 0629 .  ticagrelor (BRILINTA) tablet, , , PRN, Kathleene Hazel, MD, 180 mg at 09/20/15 [phone]  tirofiban (AGGRASTAT) bolus via infusion, , , PRN, Kathleene Hazel, MD, 1,815 mcg at 09/20/15 [phone]  tirofiban (AGGRASTAT) infusion 50 mcg/mL 100 mL, , , Continuous PRN, Kathleene Hazel, MD, Last Rate: 13.1 mL/hr at 09/20/15 0651, 0.15 mcg/kg/min at 09/20/15 0651    TELE:    NSR, ST elevation    ECG: ST elevation in anterolateral leads with reciprocal depression in anterior leads.   Coronary Stent Intervention  Left Heart Cath and Coronary Angiography 09/20/15    Mid RCA lesion, 20 %stenosed.  A STENT SYNERGY DES 3.5X16 drug eluting stent was successfully placed.  Mid LAD lesion, 100 %stenosed.  Post intervention, there is a 0% residual stenosis.  The left ventricular ejection fraction is 25-35% by visual estimate.  LV end diastolic pressure is moderately elevated.  There is moderate to severe left ventricular systolic dysfunction.  There is  no mitral valve regurgitation.   1. Acute anterior STEMI secondary to occluded proximal to mid LAD 2. Successful PTCA/DES x 1 proximal to mid LAD 3. Mild non-obstructive disease RCA 4. Severe LV systolic dysfunction.   Recommendations: Will admit to ICU. Echo tomorrow. Will continue Aggrastat for 18 hours. Will contunue DAPT with ASA, Brilinta for one year. Will start low dose Coreg this am as well as high intensity statin. Will start Ace-inh as BP tolerates. She will likely need a Lifevest before discharge given the severe anterior wall dysfunction noted on LV gram.    Current Medications:  . antiseptic oral rinse  7 mL Mouth Rinse BID  . aspirin  324 mg Oral Once  . aspirin  81 mg Oral Daily  . atorvastatin  80 mg Oral q1800  . calcium-vitamin D  1 tablet Oral Q breakfast  . carvedilol  6.25 mg Oral BID WC  . desogestrel-ethinyl estradiol  1 tablet Oral Daily  . docusate sodium  100 mg Oral q morning - 10a  . heparin  5,000 Units Subcutaneous Q8H  . levothyroxine  125 mcg Oral QAC breakfast  . lisinopril  2.5 mg Oral Daily  . pantoprazole  40 mg Oral Daily  . sodium chloride flush  3 mL Intravenous Q12H  . sodium chloride flush  3 mL Intravenous Q12H  . ticagrelor  90 mg Oral BID  . triamcinolone  2 spray Nasal Daily      ASSESSMENT AND PLAN: Active Problems:   Acute ST elevation myocardial infarction (STEMI) involving left anterior descending coronary artery (HCC)   Acute anterior wall MI (HCC)   1. STEMI involving left anterior descending coronary artery:   --s/p DES to LAD   --relook cath 7/28 ok 2. Ischemic CM EF 30-35% with persistent ST elevation on ECG 3. Hypothyroidism  She is improved today but BP soft and HR in 90s in the setting of significant  LV dysfunction. Given ECG suspect she will not have much EF recovery. Continue DAPT, carvedilol, lisinopril. Add spiro 12.5  Consult CR. Move to SDU. Possible d/c Monday with Life Vest.  Will discuss discontinuation of  HRT given STEMI with lack of CRFs.   Bensimhon, Daniel,MD 12:31 PM

## 2015-09-22 NOTE — Progress Notes (Signed)
CARDIAC REHAB PHASE I   PRE:  Rate/Rhythm: 92 sinus rhythm  BP:  Supine:    Sitting: 115/49 Standing:    SaO2: 96% ra   MODE:  Ambulation: 600 ft   POST:  Rate/Rhythem: 122 sinus tach  BP:  Supine:   Sitting:121/92  Standing:    SaO2: 97% ra   Pt ambulated in hallway x1 assist, steady gait.  Asymptomatic, tolerated well.  Pt to restroom post ambulation then returned to bed with call light in reach. Heart attack booklet given.   [employer]

## 2015-09-23 LAB — HEPARIN LEVEL (UNFRACTIONATED): Heparin Unfractionated: 0.33 IU/mL (ref 0.30–0.70)

## 2015-09-23 MED ORDER — AMIODARONE LOAD VIA INFUSION
150.0000 mg | Freq: Once | INTRAVENOUS | Status: AC
  Administered 2015-09-23: 150 mg via INTRAVENOUS

## 2015-09-23 MED ORDER — ALPRAZOLAM 0.5 MG PO TABS
0.5000 mg | ORAL_TABLET | Freq: Three times a day (TID) | ORAL | Status: DC | PRN
  Administered 2015-09-23 – 2015-09-25 (×3): 0.5 mg via ORAL
  Filled 2015-09-23 (×3): qty 1

## 2015-09-23 MED ORDER — AMIODARONE HCL IN DEXTROSE 360-4.14 MG/200ML-% IV SOLN
INTRAVENOUS | Status: AC
  Filled 2015-09-23: qty 200

## 2015-09-23 MED ORDER — AMIODARONE HCL IN DEXTROSE 360-4.14 MG/200ML-% IV SOLN
30.0000 mg/h | INTRAVENOUS | Status: DC
  Administered 2015-09-24 (×2): 30 mg/h via INTRAVENOUS
  Filled 2015-09-23 (×2): qty 200

## 2015-09-23 MED ORDER — AMIODARONE HCL IN DEXTROSE 360-4.14 MG/200ML-% IV SOLN
60.0000 mg/h | INTRAVENOUS | Status: DC
  Administered 2015-09-23 (×2): 60 mg/h via INTRAVENOUS
  Filled 2015-09-23: qty 200

## 2015-09-23 MED ORDER — HEPARIN (PORCINE) IN NACL 100-0.45 UNIT/ML-% IJ SOLN
1000.0000 [IU]/h | INTRAMUSCULAR | Status: DC
  Administered 2015-09-23 – 2015-09-24 (×2): 1000 [IU]/h via INTRAVENOUS
  Filled 2015-09-23: qty 250

## 2015-09-23 MED ORDER — SPIRONOLACTONE 25 MG PO TABS
12.5000 mg | ORAL_TABLET | Freq: Once | ORAL | Status: AC
  Administered 2015-09-23: 12.5 mg via ORAL
  Filled 2015-09-23: qty 1

## 2015-09-23 MED ORDER — HEPARIN BOLUS VIA INFUSION
4000.0000 [IU] | Freq: Once | INTRAVENOUS | Status: AC
  Administered 2015-09-23: 4000 [IU] via INTRAVENOUS
  Filled 2015-09-23: qty 4000

## 2015-09-23 MED ORDER — ZOLPIDEM TARTRATE 5 MG PO TABS: 10.0000 mg | ORAL_TABLET | Freq: Every evening | ORAL | Status: DC | PRN

## 2015-09-23 MED ORDER — LISINOPRIL 2.5 MG PO TABS
2.5000 mg | ORAL_TABLET | Freq: Two times a day (BID) | ORAL | Status: DC
  Administered 2015-09-23 – 2015-09-26 (×6): 2.5 mg via ORAL
  Filled 2015-09-23 (×6): qty 1

## 2015-09-23 MED ORDER — SPIRONOLACTONE 25 MG PO TABS
25.0000 mg | ORAL_TABLET | Freq: Every day | ORAL | Status: DC
  Administered 2015-09-24 – 2015-09-26 (×3): 25 mg via ORAL
  Filled 2015-09-23 (×4): qty 1

## 2015-09-23 MED ORDER — MELATONIN 3 MG PO TABS
3.0000 mg | ORAL_TABLET | Freq: Every evening | ORAL | Status: DC | PRN
  Administered 2015-09-23: 3 mg via ORAL
  Filled 2015-09-23 (×2): qty 1

## 2015-09-23 MED ORDER — NON FORMULARY: 3.0000 mg | Freq: Every evening | Status: DC | PRN

## 2015-09-23 MED ORDER — HEPARIN (PORCINE) IN NACL 100-0.45 UNIT/ML-% IJ SOLN
1000.0000 [IU]/kg/h | INTRAMUSCULAR | Status: DC
  Filled 2015-09-23: qty 250

## 2015-09-23 NOTE — Progress Notes (Addendum)
CARDIOLOGY PROGRESS NOTE  Patient Name: Catherine Mays Date of Encounter: 09/23/2015  Active Problems:   Acute ST elevation myocardial infarction (STEMI) involving left anterior descending coronary artery (HCC)   Acute anterior wall MI [facility])   Primary Cardiologist: New Patient Profile: 50 year old female with no prior cardiac history. She presented to [facility] on 09/20/15 with chest pain with radiation to her right and left arms with associated nausea and vomiting. She initially was sent home but returned to the ED at [facility] a few hours later with worsening chest pain, EKG showed ST elevation in lateral and precordial leads. Code STEMI was activated, she received a DES to her LAD on 09/20/15.   SUBJECTIVE:   Underwent relook cath on 7/28 for ongoing CP and persistent ST elevation. LAD stent patent.   Feels better today. No CP. Did have some dyspnea last night but improved this am,  Persistent ST elevation on ECG.  EF 30-35% on echo. SBP 100-115    OBJECTIVE Vitals:   09/23/15 0600 09/23/15 0755 09/23/15 0800 09/23/15 0939  BP: 103/82 116/84  104/74  Pulse: 79 91    Resp: 19     Temp:   98.4 F (36.9 C)   TempSrc:   Oral   SpO2: 97%     Weight:      Height:        Intake/Output Summary (Last 24 hours) at 09/23/15 1129 Last data filed at 09/23/15 1000  Gross per 24 hour  Intake             1060 ml  Output              550 ml  Net              510 ml   Filed Weights   09/20/15 0609 09/20/15 0800  Weight: 72.6 kg (160 lb) 72.9 kg (160 lb 11.5 oz)    PHYSICAL EXAM General: Well developed, well nourished, female , sitting in bed NAD Head: Normocephalic, atraumatic.  Neck: Supple without bruits, JVP 6-7 Lungs:  Resp regular and unlabored, CTA. Heart: RRR, S1, S2, no S3, S4, or murmur; no rub. Abdomen: Soft, non-tender, non-distended, BS + x 4.  Extremities: No clubbing, cyanosis,no edema.  Neuro: Alert and oriented X 3. Moves all extremities  spontaneously. Psych: Normal affect.  LABS: CBC:  Recent Labs  09/21/15 0333 09/21/15 1257  WBC 13.9* 15.8*  HGB 12.0 11.9*  HCT 36.1 37.0  MCV 88.0 89.8  PLT 256 230   INR: No results for input(s): INR in the last 72 hours. Basic Metabolic Panel:  Recent Labs  40/98/11 0333 09/21/15 1257  NA 134*  --   K 3.8  --   CL 103  --   CO2 22  --   GLUCOSE 144*  --   BUN 14  --   CREATININE 0.82 0.77  CALCIUM 7.8*  --    Liver Function Tests:  Recent Labs  09/21/15 0333  AST 310*  ALT 74*  ALKPHOS 55  BILITOT 0.7  PROT 6.4*  ALBUMIN 3.2*   Cardiac Enzymes:  Recent Labs  09/20/15 1138 09/20/15 1716 09/20/15 2258  TROPONINI >65.00* >65.00* >65.00*     Current Facility-Administered Medications:  .  0.9 %  sodium chloride infusion, 250 mL, Intravenous, PRN, Kathleene Hazel, MD, Last Rate: 10 mL/hr at 09/20/15 1800, 250 mL at 09/20/15 1800 .  0.9 %  sodium chloride infusion, 250 mL, Intravenous, PRN,  Tonny Bollman, MD .  acetaminophen (TYLENOL) tablet 650 mg, 650 mg, Oral, Q4H PRN, Kathleene Hazel, MD, 650 mg at 09/22/15 1659 .  antiseptic oral rinse (CPC / CETYLPYRIDINIUM CHLORIDE 0.05%) solution 7 mL, 7 mL, Mouth Rinse, BID, Kathleene Hazel, MD .  aspirin chewable tablet 324 mg, 324 mg, Oral, Once, Kathleene Hazel, MD .  aspirin chewable tablet 81 mg, 81 mg, Oral, Daily, Kathleene Hazel, MD, 81 mg at 09/23/15 0939 .  atorvastatin (LIPITOR) tablet 80 mg, 80 mg, Oral, q1800, Kathleene Hazel, MD, 80 mg at 09/22/15 1659 .  calcium-vitamin D (OSCAL WITH D) 500-200 MG-UNIT per tablet 1 tablet, 1 tablet, Oral, Q breakfast, Kathleene Hazel, MD, 1 tablet at 09/23/15 0755 .  carvedilol (COREG) tablet 6.25 mg, 6.25 mg, Oral, BID WC, Rhonda G Barrett, PA-C, 6.25 mg at 09/23/15 0756 .  desogestrel-ethinyl estradiol (APRI,EMOQUETTE,SOLIA) 0.15-30 MG-MCG per tablet 1 tablet, 1 tablet, Oral, Daily, Rhonda G Barrett, PA-C .   docusate sodium (COLACE) capsule 100 mg, 100 mg, Oral, q morning - 10a, Rhonda G Barrett, PA-C .  fentaNYL (SUBLIMAZE) injection 25 mcg, 25 mcg, Intravenous, Q2H PRN, Arty Baumgartner, NP, 25 mcg at 09/20/15 1828 .  heparin injection 5,000 Units, 5,000 Units, Subcutaneous, Q8H, Tonny Bollman, MD, 5,000 Units at 09/23/15 [phone]  levothyroxine (SYNTHROID, LEVOTHROID) tablet 125 mcg, 125 mcg, Oral, QAC breakfast, Joline Salt Barrett, PA-C, 125 mcg at 09/23/15 0756 .  lisinopril (PRINIVIL,ZESTRIL) tablet 2.5 mg, 2.5 mg, Oral, Daily, Kathleene Hazel, MD, 2.5 mg at 09/23/15 0939 .  Melatonin TABS 3 mg, 3 mg, Oral, QHS PRN, Macario Golds, MD, 3 mg at 09/23/15 0200 .  ondansetron (ZOFRAN) injection 4 mg, 4 mg, Intravenous, Q6H PRN, Kathleene Hazel, MD, 4 mg at 09/20/15 1615 .  oxyCODONE-acetaminophen (PERCOCET/ROXICET) 5-325 MG per tablet 1-2 tablet, 1-2 tablet, Oral, Q4H PRN, Kathleene Hazel, MD .  pantoprazole (PROTONIX) EC tablet 40 mg, 40 mg, Oral, Daily, Rhonda G Barrett, PA-C, 40 mg at 09/23/15 0939 .  sodium chloride flush (NS) 0.9 % injection 3 mL, 3 mL, Intravenous, Q12H, Kathleene Hazel, MD, 3 mL at 09/23/15 0802 .  sodium chloride flush (NS) 0.9 % injection 3 mL, 3 mL, Intravenous, PRN, Kathleene Hazel, MD .  sodium chloride flush (NS) 0.9 % injection 3 mL, 3 mL, Intravenous, Q12H, Tonny Bollman, MD, 3 mL at 09/23/15 0802 .  sodium chloride flush (NS) 0.9 % injection 3 mL, 3 mL, Intravenous, PRN, Tonny Bollman, MD .  spironolactone (ALDACTONE) tablet 12.5 mg, 12.5 mg, Oral, Daily, Dolores Patty, MD, 12.5 mg at 09/23/15 0941 .  ticagrelor (BRILINTA) tablet 90 mg, 90 mg, Oral, BID, Kathleene Hazel, MD, 90 mg at 09/23/15 0939 .  triamcinolone (NASACORT) nasal inhaler 2 spray, 2 spray, Nasal, Daily, Joline Salt Barrett, PA-C, 2 spray at 09/23/15 0940  Facility-Administered Medications Ordered in Other Encounters:  .  bivalirudin (ANGIOMAX) 250 mg in sodium  chloride 0.9 % 50 mL (5 mg/mL) infusion, , , Continuous PRN, Kathleene Hazel, MD, Stopped at 09/20/15 [phone]  bivalirudin (ANGIOMAX) BOLUS via infusion, , , PRN, Kathleene Hazel, MD, 54.45 mg at 09/20/15 7782 .  fentaNYL (SUBLIMAZE) injection, , , PRN, Kathleene Hazel, MD, 25 mcg at 09/20/15 0651 .  heparin 1,500 mL, , , PRN, Kathleene Hazel, MD .  heparin infusion 2 units/mL in 0.9 % sodium chloride, , , Continuous PRN, Kathleene Hazel, MD, 1,500 mL  at 09/20/15 0733 .  iopamidol (ISOVUE-370) 76 % injection, , , PRN, Kathleene Hazel, MD, 165 mL at 09/20/15 0733 .  lidocaine (PF) (XYLOCAINE) 1 % injection, , , PRN, Kathleene Hazel, MD, 2 mL at 09/20/15 1610 .  midazolam (VERSED) injection, , , PRN, Kathleene Hazel, MD, 1 mg at 09/20/15 0651 .  nitroGLYCERIN 1 mg/10 ml (100 mcg/ml) - IR/CATH LAB, , , PRN, Kathleene Hazel, MD, 200 mcg at 09/20/15 9604 .  Radial Cocktail/Verapamil only, , , PRN, Kathleene Hazel, MD, 10 mL at 09/20/15 0629 .  ticagrelor (BRILINTA) tablet, , , PRN, Kathleene Hazel, MD, 180 mg at 09/20/15 [phone]  tirofiban (AGGRASTAT) bolus via infusion, , , PRN, Kathleene Hazel, MD, 1,815 mcg at 09/20/15 [phone] .  tirofiban (AGGRASTAT) infusion 50 mcg/mL 100 mL, , , Continuous PRN, Kathleene Hazel, MD, Last Rate: 13.1 mL/hr at 09/20/15 0651, 0.15 mcg/kg/min at 09/20/15 0651    TELE:    NSR, ST elevation    ECG: ST elevation in anterolateral leads with reciprocal depression in anterior leads.   Coronary Stent Intervention  Left Heart Cath and Coronary Angiography 09/20/15    Mid RCA lesion, 20 %stenosed.  A STENT SYNERGY DES 3.5X16 drug eluting stent was successfully placed.  Mid LAD lesion, 100 %stenosed.  Post intervention, there is a 0% residual stenosis.  The left ventricular ejection fraction is 25-35% by visual estimate.  LV end diastolic pressure is moderately elevated.  There  is moderate to severe left ventricular systolic dysfunction.  There is no mitral valve regurgitation.   1. Acute anterior STEMI secondary to occluded proximal to mid LAD 2. Successful PTCA/DES x 1 proximal to mid LAD 3. Mild non-obstructive disease RCA 4. Severe LV systolic dysfunction.   Recommendations: Will admit to ICU. Echo tomorrow. Will continue Aggrastat for 18 hours. Will contunue DAPT with ASA, Brilinta for one year. Will start low dose Coreg this am as well as high intensity statin. Will start Ace-inh as BP tolerates. She will likely need a Lifevest before discharge given the severe anterior wall dysfunction noted on LV gram.    Current Medications:  . antiseptic oral rinse  7 mL Mouth Rinse BID  . aspirin  324 mg Oral Once  . aspirin  81 mg Oral Daily  . atorvastatin  80 mg Oral q1800  . calcium-vitamin D  1 tablet Oral Q breakfast  . carvedilol  6.25 mg Oral BID WC  . desogestrel-ethinyl estradiol  1 tablet Oral Daily  . docusate sodium  100 mg Oral q morning - 10a  . heparin  5,000 Units Subcutaneous Q8H  . levothyroxine  125 mcg Oral QAC breakfast  . lisinopril  2.5 mg Oral Daily  . pantoprazole  40 mg Oral Daily  . sodium chloride flush  3 mL Intravenous Q12H  . sodium chloride flush  3 mL Intravenous Q12H  . spironolactone  12.5 mg Oral Daily  . ticagrelor  90 mg Oral BID  . triamcinolone  2 spray Nasal Daily      ASSESSMENT AND PLAN: Active Problems:   Acute ST elevation myocardial infarction (STEMI) involving left anterior descending coronary artery (HCC)   Acute anterior wall MI (HCC)   1. STEMI involving left anterior descending coronary artery:   --s/p DES to LAD   --relook cath 7/28 ok 2. Ischemic CM EF 30-35% with persistent ST elevation on ECG 3. Hypothyroidism  She is improved today but had some dyspnea  last night. Volume status looks ok but will increase spiro to 25. Could possibly be Brillinta. Will follow closely.   Given ECG suspect she  will not have much EF recovery. Continue DAPT, carvedilol, lisinopril. Increase spiro to 25. Add lasix as needed. Continue CR. Move to tele. Possible d/c tomorrow with Life Vest. (ordered and d/w Dennis Bast RN)  Ideally would switch OCP to IUD given prothrombotic nature of OCPs. She will d/w her gynecologist in 2 weeks.   Raina Sole,MD 11:29 AM   Addendum:  Developed rapid AF (140s). Will start IV amio and heparin. Cancel ICU transfer. CHADSVASC = 2-3 (female, vasc disease, HF). Will need to consider switching to plavix/eliquis vs DAPT x 1 month with Brillinta followed by Plavix/Eliquis.  Gwendolynn Merkey,MD 1:35 PM

## 2015-09-23 NOTE — Progress Notes (Signed)
ANTICOAGULATION CONSULT NOTE - Initial Consult  Pharmacy Consult for heparin Indication: atrial fibrillation  No Known Allergies  Patient Measurements: Height: 5\' 9"  (175.3 cm) Weight: 160 lb 11.5 oz (72.9 kg) IBW/kg (Calculated) : 66.2 Heparin Dosing Weight: 72.9  Vital Signs: Temp: 98 F (36.7 C) (07/30 1200) Temp Source: Oral (07/30 1200) BP: 105/80 (07/30 1200) Pulse Rate: 84 (07/30 1200)  Labs:  Recent Labs  09/20/15 1716 09/20/15 2258 09/21/15 0333 09/21/15 1257  HGB 11.7*  --  12.0 11.9*  HCT 35.2*  --  36.1 37.0  PLT 258  --  256 230  CREATININE  --   --  0.82 0.77  TROPONINI >65.00* >65.00*  --   --     Estimated Creatinine Clearance: 88.9 mL/min (by C-G formula based on SCr of 0.8 mg/dL).   Medical History: Past Medical History:  Diagnosis Date  . Thyroid disease     Assessment: 50 yo female with no cardiac history admitted with STEMI s/p DES to LAD on 7/27. Today HR went into the 140-170s appearing to be afib RVR. Pharmacy consulted to dose heparin.  Goal of Therapy:  Heparin level 0.3-0.7 units/ml Monitor platelets by anticoagulation protocol: Yes   Plan:  Give 4000 units bolus x 1 Start heparin infusion at 1000 units/hr Check anti-Xa level in 6 hours and daily while on heparin Continue to monitor H&H and platelets   Sherron Monday, PharmD Clinical Pharmacy Resident Pager: [phone] 09/23/15 2:56 PM

## 2015-09-23 NOTE — Progress Notes (Signed)
  Amiodarone Drug - Drug Interaction Consult Note  Recommendations: No recommendations for dose adjustments with patients current medications. Will follow up if anything changes during patients course of admission.  Amiodarone is metabolized by the cytochrome P450 system and therefore has the potential to cause many drug interactions. Amiodarone has an average plasma half-life of 50 days (range 20 to 100 days).   There is potential for drug interactions to occur several weeks or months after stopping treatment and the onset of drug interactions may be slow after initiating amiodarone.   []  Statins: Increased risk of myopathy. Simvastatin- restrict dose to 20mg  daily. Other statins: counsel patients to report any muscle pain or weakness immediately.  []  Anticoagulants: Amiodarone can increase anticoagulant effect. Consider warfarin dose reduction. Patients should be monitored closely and the dose of anticoagulant altered accordingly, remembering that amiodarone levels take several weeks to stabilize.  []  Antiepileptics: Amiodarone can increase plasma concentration of phenytoin, the dose should be reduced. Note that small changes in phenytoin dose can result in large changes in levels. Monitor patient and counsel on signs of toxicity.  [x]  Beta blockers: increased risk of bradycardia, AV block and myocardial depression. Sotalol - avoid concomitant use.  []   Calcium channel blockers (diltiazem and verapamil): increased risk of bradycardia, AV block and myocardial depression.  []   Cyclosporine: Amiodarone increases levels of cyclosporine. Reduced dose of cyclosporine is recommended.  []  Digoxin dose should be halved when amiodarone is started.  [x]  Diuretics: increased risk of cardiotoxicity if hypokalemia occurs.  []  Oral hypoglycemic agents (glyburide, glipizide, glimepiride): increased risk of hypoglycemia. Patient's glucose levels should be monitored closely when initiating amiodarone  therapy.   []  Drugs that prolong the QT interval:  Torsades de pointes risk may be increased with concurrent use - avoid if possible.  Monitor QTc, also keep magnesium/potassium WNL if concurrent therapy can't be avoided. Marland Kitchen Antibiotics: e.g. fluoroquinolones, erythromycin. . Antiarrhythmics: e.g. quinidine, procainamide, disopyramide, sotalol. . Antipsychotics: e.g. phenothiazines, haloperidol.  . Lithium, tricyclic antidepressants, and methadone.  Thank You,  Sherron Monday, PharmD Clinical Pharmacy Resident Pager: [phone] 09/23/15 2:59 PM

## 2015-09-23 NOTE — Progress Notes (Signed)
ANTICOAGULATION CONSULT NOTE - Initial Consult  Pharmacy Consult for heparin Indication: atrial fibrillation  No Known Allergies  Patient Measurements: Height: 5\' 9"  (175.3 cm) Weight: 160 lb 11.5 oz (72.9 kg) IBW/kg (Calculated) : 66.2 Heparin Dosing Weight: 72.9  Vital Signs: Temp: 98.2 F (36.8 C) (07/30 1600) Temp Source: Oral (07/30 1600) BP: 105/80 (07/30 1200) Pulse Rate: 84 (07/30 1200)  Labs:  Recent Labs  09/20/15 2258 09/21/15 0333 09/21/15 1257 09/23/15 2050  HGB  --  12.0 11.9*  --   HCT  --  36.1 37.0  --   PLT  --  256 230  --   HEPARINUNFRC  --   --   --  0.33  CREATININE  --  0.82 0.77  --   TROPONINI >65.00*  --   --   --     Estimated Creatinine Clearance: 88.9 mL/min (by C-G formula based on SCr of 0.8 mg/dL).   Medical History: Past Medical History:  Diagnosis Date  . Thyroid disease     Assessment: 50 yo female with no cardiac history admitted with STEMI s/p DES to LAD on 7/27. Today HR went into the 140-170s appearing to be afib RVR. Pharmacy consulted to dose heparin.  Initial HL therapeutic at 0.33, no issues with infusion or sxs of bleeding.   Goal of Therapy:  Heparin level 0.3-0.7 units/ml Monitor platelets by anticoagulation protocol: Yes   Plan:  1. Continue heparin at 1000 unit/hr 2. HL in am to serve as confirmatory 3. Daily CBC   Pollyann Samples, PharmD, BCPS 09/23/2015, 9:17 PM Pager: [phone]

## 2015-09-23 NOTE — Progress Notes (Signed)
Patient HR sustaining in 140's-170's appearing to be afib RVR.  PT c/o SOB & fluttering sensation in chest.  EKG obtained, BP & O2 stable.  MD notified and new orders received for IV heparin & amiodarone.  Transfer order to tele discontinued & will continue to monitor closely. [place], Catherine Mays

## 2015-09-24 ENCOUNTER — Encounter (HOSPITAL_COMMUNITY): Payer: Self-pay | Admitting: Cardiology

## 2015-09-24 DIAGNOSIS — I420 Dilated cardiomyopathy: Secondary | ICD-10-CM

## 2015-09-24 DIAGNOSIS — E039 Hypothyroidism, unspecified: Secondary | ICD-10-CM

## 2015-09-24 DIAGNOSIS — E038 Other specified hypothyroidism: Secondary | ICD-10-CM

## 2015-09-24 DIAGNOSIS — I480 Paroxysmal atrial fibrillation: Secondary | ICD-10-CM

## 2015-09-24 HISTORY — DX: Hypothyroidism, unspecified: E03.9

## 2015-09-24 HISTORY — DX: Paroxysmal atrial fibrillation: I48.0

## 2015-09-24 LAB — CBC
HCT: 36.8 % (ref 36.0–46.0)
Hemoglobin: 11.9 g/dL — ABNORMAL LOW (ref 12.0–15.0)
MCH: 29 pg (ref 26.0–34.0)
MCHC: 32.3 g/dL (ref 30.0–36.0)
MCV: 89.8 fL (ref 78.0–100.0)
Platelets: 244 10*3/uL (ref 150–400)
RBC: 4.1 MIL/uL (ref 3.87–5.11)
RDW: 13.5 % (ref 11.5–15.5)
WBC: 9.2 10*3/uL (ref 4.0–10.5)

## 2015-09-24 LAB — HEPATIC FUNCTION PANEL
ALT: 31 U/L (ref 14–54)
AST: 34 U/L (ref 15–41)
Albumin: 3 g/dL — ABNORMAL LOW (ref 3.5–5.0)
Alkaline Phosphatase: 56 U/L (ref 38–126)
Bilirubin, Direct: <0.1 mg/dL — ABNORMAL LOW (ref 0.1–0.5)
Total Bilirubin: 0.4 mg/dL (ref 0.3–1.2)
Total Protein: 6.5 g/dL (ref 6.5–8.1)

## 2015-09-24 LAB — HEPARIN LEVEL (UNFRACTIONATED): Heparin Unfractionated: 0.33 IU/mL (ref 0.30–0.70)

## 2015-09-24 LAB — TSH: TSH: 6.184 u[IU]/mL — ABNORMAL HIGH (ref 0.350–4.500)

## 2015-09-24 MED ORDER — RIVAROXABAN 20 MG PO TABS
20.0000 mg | ORAL_TABLET | Freq: Every day | ORAL | Status: DC
  Administered 2015-09-24 – 2015-09-25 (×2): 20 mg via ORAL
  Filled 2015-09-24 (×2): qty 1

## 2015-09-24 MED ORDER — AMIODARONE HCL 200 MG PO TABS
200.0000 mg | ORAL_TABLET | Freq: Two times a day (BID) | ORAL | Status: DC
  Administered 2015-09-24 – 2015-09-26 (×5): 200 mg via ORAL
  Filled 2015-09-24 (×5): qty 1

## 2015-09-24 NOTE — Discharge Instructions (Signed)

## 2015-09-24 NOTE — Progress Notes (Signed)
CARDIAC REHAB PHASE I   PRE:  Rate/Rhythm: 88 SR   BP:  Sitting: 108/73        SaO2: 100 RA  MODE:  Ambulation: 700 ft   POST:  Rate/Rhythm: 91 SR   BP:  Sitting: 111/85         SaO2: 100 RA  Pt ambulated 700 ft on RA, handheld assist, steady gait, tolerated well with no complaints. Completed MI/stent education with pt and husband at bedside.  Reviewed risk factors, MI book, anti-platelet therapy, stent card, activity restrictions, ntg, exercise, heart healthy diet, sodium restrictions, CHF booklet and zone tool, daily weights, "off the beat" book, and phase 2 cardiac rehab. Pt to watch life vest video after transfer to 2W. Pt verbalized understanding, receptive to educaiton. Pt agrees to phase 2 cardiac rehab, will send referral to [place] per pt request. Pt to recliner after walk, call bell within reach. Will follow.    [id number] Joylene Grapes, RN, BSN 09/24/2015 2:33 PM

## 2015-09-24 NOTE — Progress Notes (Addendum)
ANTICOAGULATION CONSULT NOTE - Initial Consult  Pharmacy Consult for heparin Indication: atrial fibrillation  No Known Allergies  Patient Measurements: Height: 5\' 9"  (175.3 cm) Weight: 160 lb 11.5 oz (72.9 kg) IBW/kg (Calculated) : 66.2 Heparin Dosing Weight: 72.9  Vital Signs: Temp: 98.4 F (36.9 C) (07/31 0800) Temp Source: Oral (07/31 0800) BP: 99/77 (07/31 0800) Pulse Rate: 82 (07/31 0800)  Labs:  Recent Labs  09/21/15 1257 09/23/15 2050 09/24/15 0244  HGB 11.9*  --  11.9*  HCT 37.0  --  36.8  PLT 230  --  244  HEPARINUNFRC  --  0.33 0.33  CREATININE 0.77  --   --     Estimated Creatinine Clearance: 88.9 mL/min (by C-G formula based on SCr of 0.8 mg/dL).   Medical History: Past Medical History:  Diagnosis Date  . Thyroid disease     Assessment: 50 yo female with no cardiac history admitted with STEMI s/p DES to LAD on 7/27. She is now noted with afib on heparin with plans for possible apixiban -HL= 0.33, hg= 11.9, plt= 244  Goal of Therapy:  Heparin level 0.3-0.7 units/ml Monitor platelets by anticoagulation protocol: Yes   Plan:  -No heparin changes needed -Daily heparin level and CBC -Will follow oral anticoagulation plans  Harland German, Pharm D 09/24/2015 8:44 AM   Addendum -Changing to xarelto -CrCl ~ 90  Plan -Begin xarelto 20mg  po daily. Begin today at lunch then daily at dinner   Harland German, Ilda Basset D 09/24/2015 12:36 PM

## 2015-09-24 NOTE — Progress Notes (Signed)
SUBJECTIVE:  No complaints.  Went into PAF yesterday and now in NSR on IV Amio and IV Heparin.   OBJECTIVE:   Vitals:   Vitals:   09/24/15 0500 09/24/15 0507 09/24/15 0600 09/24/15 0800  BP:  107/81  99/77  Pulse: 71  68 82  Resp:  16    Temp:  98.1 F (36.7 C)  98.4 F (36.9 C)  TempSrc:  Oral  Oral  SpO2: 99%  100% 98%  Weight:      Height:       I&O's:   Intake/Output Summary (Last 24 hours) at 09/24/15 1052 Last data filed at 09/24/15 1000  Gross per 24 hour  Intake           875.62 ml  Output                0 ml  Net           875.62 ml   TELEMETRY: Reviewed telemetry pt in NSR:     PHYSICAL EXAM General: Well developed, well nourished, in no acute distress Head: Eyes PERRLA, No xanthomas.   Normal cephalic and atramatic  Lungs:   Clear bilaterally to auscultation and percussion. Heart:   HRRR S1 S2 Pulses are 2+ & equal.            No carotid bruit. No JVD.  No abdominal bruits. No femoral bruits. Abdomen: Bowel sounds are positive, abdomen soft and non-tender without masses  Msk:  Back normal, normal gait. Normal strength and tone for age. Extremities:   No clubbing, cyanosis or edema.  DP +1 Neuro: Alert and oriented X 3. Psych:  Good affect, responds appropriately   LABS: Basic Metabolic Panel:  Recent Labs  40/98/11 1257  CREATININE 0.77   Liver Function Tests: No results for input(s): AST, ALT, ALKPHOS, BILITOT, PROT, ALBUMIN in the last 72 hours. No results for input(s): LIPASE, AMYLASE in the last 72 hours. CBC:  Recent Labs  09/21/15 1257 09/24/15 0244  WBC 15.8* 9.2  HGB 11.9* 11.9*  HCT 37.0 36.8  MCV 89.8 89.8  PLT 230 244   Cardiac Enzymes: No results for input(s): CKTOTAL, CKMB, CKMBINDEX, TROPONINI in the last 72 hours. BNP: Invalid input(s): POCBNP D-Dimer: No results for input(s): DDIMER in the last 72 hours. Hemoglobin A1C: No results for input(s): HGBA1C in the last 72 hours. Fasting Lipid Panel: No results for  input(s): CHOL, HDL, LDLCALC, TRIG, CHOLHDL, LDLDIRECT in the last 72 hours. Thyroid Function Tests: No results for input(s): TSH, T4TOTAL, T3FREE, THYROIDAB in the last 72 hours.  Invalid input(s): FREET3 Anemia Panel: No results for input(s): VITAMINB12, FOLATE, FERRITIN, TIBC, IRON, RETICCTPCT in the last 72 hours. Coag Panel:   Lab Results  Component Value Date   INR 1.12 09/20/2015    RADIOLOGY: No results found.    ASSESSMENT/PLAN:  50 year old female with no prior cardiac history. She presented to [facility] on [facility]/15 with chest pain with radiation to her right and left arms with associated nausea and vomiting. She initially was sent home but returned to the ED at [facility] a few hours later with worsening chest pain, EKG showed ST elevation in lateral and precordial leads. Code STEMI was activated, she received a DES to her LAD on 09/20/15.  Underwent relook cath on 7/28 for ongoing CP and persistent ST elevation. LAD stent patent.  1. STEMI involving left anterior descending coronary artery:   --s/p DES to LAD   --relook  cath 7/28 ok   --continue DAPT with ASA and Brilinta.   --continue BB/ACE I and spiro with PRN Lasix.   --continue cardiac rehab.    --check outpt FLP and ALT in 6 weeks.     --Ideally would switch OCP to IUD given prothrombotic nature of OCPs. She will d/w her gynecologist in 2 weeks.  2. Ischemic CM EF 30-35% with persistent ST elevation on ECG - LifeVest has been ordered.  Continue BB/ACE I and aldactone with PRN lasix.  3. Hypothyroidism -  Continue synthroid.  Check TSH. 4. Elevated LFTs ? Shock liver - repeat this am to make sure they have normalized or at least trending downward.  5. ASCAD with occluded mid LAD and 20% RCA.  Continue DAPT. 6. PAF now back in NSR on IV Amio and Heparin.  Change IV Amio to 200mg  BID and continue BB.  If remains in NSR consider stopping Amio in the next few weeks.  This patients CHA2DS2-VASc Score and  unadjusted Ischemic Stroke Rate (% per year) is equal to 2.2 % stroke rate/year from a score of 2. Above score calculated as 1 point each if present [CHF, HTN, DM, Vascular=MI/PAD/Aortic Plaque, Age if 65-74, or Female].Above score calculated as 2 points each if present [Age > 75, or Stroke/TIA/TE].  Will transition to Xarelto 20mg  daily and continue DAPT for 1 month then stop ASA.  Will discuss with interventionalist whether we should change to Plavix from Brilinta.  Will transfer out to tele bed today.  Probable d/c home in am.   Armanda Magic, MD  09/24/2015  10:52 AM

## 2015-09-24 NOTE — Care Management Note (Addendum)
Case Management Note  Patient Details  Name: Catherine Mays MRN: [id number] Date of Birth: Dec 20, 1965  Subjective/Objective:          Adm w mi          Action/Plan: lives w husband, pcp dr vasireddy.   Expected Discharge Date:                  Expected Discharge Plan:  Home/Self Care  In-House Referral:     Discharge planning Services  CM Consult, Medication Assistance  Post Acute Care Choice:    Choice offered to:     DME Arranged:    DME Agency:     HH Arranged:    HH Agency:     Status of Service:  Completed, signed off  If discussed at [employer] of Stay Meetings, dates discussed:    Additional Comments: gave pt 30day free and copay card for brilinta. Has [employer] ins. [employer] rep came by and had been contacted by md for lifevest. [employer] working on lifevest in case needed.  Hanley Hays, RN 09/24/2015, 10:12 AM

## 2015-09-25 ENCOUNTER — Telehealth: Payer: Self-pay | Admitting: Cardiovascular Disease

## 2015-09-25 LAB — CBC
HCT: 37.8 % (ref 36.0–46.0)
Hemoglobin: 12.2 g/dL (ref 12.0–15.0)
MCH: 29.3 pg (ref 26.0–34.0)
MCHC: 32.3 g/dL (ref 30.0–36.0)
MCV: 90.9 fL (ref 78.0–100.0)
Platelets: 270 10*3/uL (ref 150–400)
RBC: 4.16 MIL/uL (ref 3.87–5.11)
RDW: 13.5 % (ref 11.5–15.5)
WBC: 8.8 10*3/uL (ref 4.0–10.5)

## 2015-09-25 NOTE — Care Management Note (Addendum)
Case Management Note  Patient Details  Name: Junior Guyette MRN: [id number] Date of Birth: [birth year]  Subjective/Objective:    STEMI                Action/Plan: Discharge Planning: AVS reviewed:  Contactded [employer] Life Vest. Waiting insurance approval for Life Vest. Made pt aware. Provided pt with Xarelto 30 day free trial card and pt's husband had Brilinta trial card and copay card.   PCP Craig Staggers MD   Expected Discharge Date:  09/25/2015              Expected Discharge Plan:  Home/Self Care  In-House Referral:     Discharge planning Services  CM Consult, Medication Assistance  Post Acute Care Choice:  NA Choice offered to:  NA  DME Arranged:  Vest life vest DME Agency:  Other - Comment  HH Arranged:  NA HH Agency:  NA  Status of Service:  Completed, signed off  If discussed at Long Length of Stay Meetings, dates discussed:    Additional Comments:  Elliot Cousin, RN 09/25/2015, 3:28 PM

## 2015-09-25 NOTE — Progress Notes (Signed)
CARDIAC REHAB PHASE I   Pt awaiting life vest representative and discharge. Pt ambulating independently with no complaints. Pt states she has no questions regarding yesterday's education. Phase 2 referral sent to [place] per pt request. Pt in bed, call bell within reach.   Joylene Grapes, RN, BSN 09/25/2015 11:16 AM

## 2015-09-25 NOTE — Discharge Summary (Signed)
Discharge Summary    Patient ID: Catherine Mays,  MRN: [id number], DOB/AGE: 30-Sep-1965 50 y.o.  Admit date: 09/20/2015 Discharge date: 09/26/2015  Primary Care Provider: VASIREDDY,SABITHA Primary Cardiologist: Dr. Clifton Mays  Discharge Diagnoses    Active Problems:   Acute ST elevation myocardial infarction (STEMI) involving left anterior descending coronary artery [facility])   Acute anterior wall MI (HCC)   Hypothyroidism   DCM (dilated cardiomyopathy) (HCC)   PAF (paroxysmal atrial fibrillation) (HCC)   Allergies No Known Allergies  Diagnostic Studies/Procedures    LHC 09/20/2015  Conclusion     Mid RCA lesion, 20 %stenosed.  A STENT SYNERGY DES 3.5X16 drug eluting stent was successfully placed.  Mid LAD lesion, 100 %stenosed.  Post intervention, there is a 0% residual stenosis.  The left ventricular ejection fraction is 25-35% by visual estimate.  LV end diastolic pressure is moderately elevated.  There is moderate to severe left ventricular systolic dysfunction.  There is no mitral valve regurgitation.   1. Acute anterior STEMI secondary to occluded proximal to mid LAD 2. Successful PTCA/DES x 1 proximal to mid LAD 3. Mild non-obstructive disease RCA 4. Severe LV systolic dysfunction.   Recommendations: Will admit to ICU. Echo tomorrow. Will continue Aggrastat for 18 hours. Will contunue DAPT with ASA, Brilinta for one year. Will start low dose Coreg this am as well as high intensity statin. Will start Ace-inh as BP tolerates. She will likely need a Lifevest before discharge given the severe anterior wall dysfunction noted on LV gram.     2D echo: 09/20/2015  ------------------------------------------------------------------- Study Conclusions  - Left ventricle: The cavity size was normal. Systolic function was   moderately to severely reduced. The estimated ejection fraction   was in the range of 30% to 35%. There is akinesis of the  apicalanteroseptal and anterior myocardium. Doppler parameters   are consistent with abnormal left ventricular relaxation (grade 1   diastolic dysfunction). No evidence of thrombus. - Pulmonary arteries: Systolic pressure was mildly increased. PA   peak pressure: 30 mm Hg (S). _____________   History of Present Illness     Catherine Mays is a 50 yo female with no previous cardiac history Who presented to [facility] in [place] on 09/20/2015 with complaint of upper chest pain and arm pain along with nausea and vomiting for the previous 8 hours. Her pain was initially felt to be GI related. Her troponin was negative there, but there was no EKG to review from that visit. Reports she then went home and continue to have chest pain and came back to [facility] ED and her EKG showed ST elevation in the lateral and precordial leads. A code STEMI called and the patient was transported to [facility] for emergent cardiac catheterization with ongoing chest pain that did seem to improve when she was given morphine. EKG on admission showed sinus rhythm with a rate of 97 bpm, with 2-3 mm ST elevation in leads 1, aVL, V2 through V5.  Hospital Course     Consultants: None  She was taken emergently for cath with Dr. Clifton Mays and noted to have a occluded proximal to mid LAD, with successful PTCA/DES to the proximal to mid LAD, and severe LV systolic dysfunction was noted. She was continued on Aggrastat for 18 hours with the plan to continue the APTT with aspirin and Valenta for one year. She was also started on low-dose Coreg as well as a high intensity statin. At that time it was noted she would likely  need a LifeVest prior to discharge given her severe anterior wall dysfunction noted on her LV gram. Later that evening the patient continued to have reports of chest pain along with nausea and vomiting. She reported that her arm pain and chest pain had improved post PCI but was still very nauseated. At that time she was treated  with Reglan, fentanyl,  GI cocktail and Ativan which seemed to ultimately ease her pain and nausea. Repeat EKG showed continued ST elevation but no further elevation.  The next day following her PCI she redeveloped chest pain around 7 AM, along with general malaise. EKG was concerning for worsening ST elevation throughout the anterior lateral and reciprocal changes in the inferior leads. At that time a code STEMI was activated and she was brought back to the cath lab for a relook cath. Relook cath showed patent LAD stent and continued EF of 30-35%. She was continued on the APTT, carvedilol, lisinopril and spironolactone 12.5 mg was added. At that time she was stable to move to stepdown.  The following morning she reported feeling much better, was without angina, nausea or vomiting. Did have slight dyspnea but improved. Continued to have persistent ST elevation on EKG. Orders are placed to receive a LifeVest. Of note she was noted to be taking OCP, and a discussion was made to potentially switch ICD given the prior thrombotic nature of OCPs. This was discussed with patient and she plans to discuss this with her gynecologist in 2 weeks. Unfortunately later that evening she developed rapid A. fib with rates in the 140s, and was started on IV amiodarone and heparin. Her chadsvasc score at that time was considered a 3.  On 09/24/2015 she was able to convert to sinus rhythm, and IV amnio was changed to 200 mg twice a day with the continuation of beta blocker. She was transitioned to the relative 20 mg daily and continued on the APTT for one month and plans to stop aspirin. It was discussed with the interventional S whether she should changed to Plavix and Brilinta, and decision was made to continue Brilinta at this time. She was transferred out to a telemetry bed on the day.  On 09/25/2015 she was seen and examined by Dr. Mayford Mays who felt she was stable for discharge home. She will be discharged home on beta blocker,  Ace I, and spironolactone, with when necessary Lasix. She'll also continue on the Xarelto along with aspirin and Brilinta, with plans to stop aspirin after one month. She is going home with a LifeVest. Also of note her TSH was mildly elevated, but she plans to follow-up with her PCP to adjust her Synthroid. Her LFTs were initially elevated this admission but have normalized. She will need FLP and ALT in 6 weeks. Will also need repeat 2-D echo in 2 months to assess for any improvement in her LVEF, if EF is still less than 35% at that time will then need referral to EP for possible AICD placement. I have arrange for TOC appt for her follow up.   She was unfortunately held another day in the hospital awaiting on approval from her insurance company for her LifeVest. She was seen and assessed by Dr. Mayford Mays on the day of discharge and determined stable. _____________  Discharge Vitals Blood pressure 114/75, pulse 81, temperature 97.5 F (36.4 C), temperature source Oral, resp. rate 20, height 5\' 9"  (1.753 m), weight 160 lb 11.5 oz (72.9 kg), SpO2 96 %.  Filed Weights   09/20/15  4098 09/20/15 0800  Weight: 160 lb (72.6 kg) 160 lb 11.5 oz (72.9 kg)    Labs & Radiologic Studies    CBC  Recent Labs  09/24/15 0244 09/25/15 0259  WBC 9.2 8.8  HGB 11.9* 12.2  HCT 36.8 37.8  MCV 89.8 90.9  PLT 244 270   Basic Metabolic Panel No results for input(s): NA, K, CL, CO2, GLUCOSE, BUN, CREATININE, CALCIUM, MG, PHOS in the last 72 hours. Liver Function Tests  Recent Labs  09/24/15 1145  AST 34  ALT 31  ALKPHOS 56  BILITOT 0.4  PROT 6.5  ALBUMIN 3.0*   No results for input(s): LIPASE, AMYLASE in the last 72 hours. Cardiac Enzymes No results for input(s): CKTOTAL, CKMB, CKMBINDEX, TROPONINI in the last 72 hours. BNP Invalid input(s): POCBNP D-Dimer No results for input(s): DDIMER in the last 72 hours. Hemoglobin A1C No results for input(s): HGBA1C in the last 72 hours. Fasting Lipid  Panel No results for input(s): CHOL, HDL, LDLCALC, TRIG, CHOLHDL, LDLDIRECT in the last 72 hours. Thyroid Function Tests  Recent Labs  09/24/15 1136  TSH 6.184*   _____________  No results found. Disposition   Pt is being discharged home today in good condition.  Follow-up Plans & Appointments    Follow-up Information    Robbie Lis, PA-C Follow up on 10/10/2015.   Specialties:  Cardiology, Radiology Why:  3pm for your hospital follow up with Dr. Gibson Ramp PA Contact information: [address] [place] [address] [phone]          Discharge Instructions    (HEART FAILURE PATIENTS) Call MD:  Anytime you have any of the following symptoms: 1) 3 pound weight gain in 24 hours or 5 pounds in 1 week 2) shortness of breath, with or without a dry hacking cough 3) swelling in the hands, feet or stomach 4) if you have to sleep on extra pillows at night in order to breathe.    Complete by:  As directed   Amb Referral to Cardiac Rehabilitation    Complete by:  As directed   Diagnosis:   STEMI Coronary Stents     Diet - low sodium heart healthy    Complete by:  As directed   Discharge instructions    Complete by:  As directed   Please start taking these medications:  --Amiodarone  twice a day -- Aspirin  daily -- Lipitor  daily in the evening -- Coreg 6.25mg  twice a day --Lisinopril 2.5mg  daily --Protonix  daily -- Xarelto  daily at dinner --Aldactone  daily -- Brilinta  twice a day  Stop taking:  --reclipsen, and prilosec  Also you have a prescription for as needed lasix. You need to weight yourself daily, if you gain 2-3lb in 24 hrs or 5lb in a week, take a lasix pill and notify the office.   Increase activity slowly    Complete by:  As directed      Discharge Medications   Current Discharge Medication List    START taking these medications   Details  amiodarone (PACERONE) 200 MG tablet Take 1 tablet (200 mg  total) by mouth 2 (two) times daily. Qty: 60 tablet, Refills: 11    aspirin 81 MG chewable tablet Chew 1 tablet (81 mg total) by mouth daily.    atorvastatin (LIPITOR) 80 MG tablet Take 1 tablet (80 mg total) by mouth daily at 6 PM. Qty: 30 tablet, Refills: 11    carvedilol (COREG) 6.25 MG  tablet Take 1 tablet (6.25 mg total) by mouth 2 (two) times daily with a meal. Qty: 60 tablet, Refills: 11    furosemide (LASIX) 20 MG tablet Take 1 tablet (20 mg total) by mouth as directed. Qty: 30 tablet, Refills: 4    lisinopril (PRINIVIL,ZESTRIL) 2.5 MG tablet Take 1 tablet (2.5 mg total) by mouth daily. Qty: 30 tablet, Refills: 11    nitroGLYCERIN (NITROSTAT) 0.4 MG SL tablet Place 1 tablet (0.4 mg total) under the tongue every 5 (five) minutes as needed for chest pain. Qty: 25 tablet, Refills: 3    pantoprazole (PROTONIX) 40 MG tablet Take 1 tablet (40 mg total) by mouth daily. Qty: 30 tablet, Refills: 11    rivaroxaban (XARELTO) 20 MG TABS tablet Take 1 tablet (20 mg total) by mouth daily with supper. Qty: 30 tablet, Refills: 0    spironolactone (ALDACTONE) 25 MG tablet Take 1 tablet (25 mg total) by mouth daily. Qty: 30 tablet, Refills: 11    ticagrelor (BRILINTA) 90 MG TABS tablet Take 1 tablet (90 mg total) by mouth 2 (two) times daily. Qty: 60 tablet, Refills: 0      CONTINUE these medications which have NOT CHANGED   Details  Calcium Carb-Cholecalciferol (CALCIUM + D3 PO) Take 1 tablet by mouth every morning.    docusate sodium (COLACE) 100 MG capsule Take 100 mg by mouth every morning.    SYNTHROID 125 MCG tablet Take 125 mcg by mouth every morning. Refills: 5    triamcinolone (NASACORT ALLERGY 24HR) 55 MCG/ACT AERO nasal inhaler Place 2 sprays into the nose daily.      STOP taking these medications     omeprazole (PRILOSEC) 20 MG capsule      RECLIPSEN 0.15-30 MG-MCG tablet          Aspirin prescribed at discharge?  Yes High Intensity Statin Prescribed? (Lipitor  40-80mg  or Crestor 20-40mg ): Yes Beta Blocker Prescribed? Yes For EF <40%, was ACEI/ARB Prescribed? Yes ADP Receptor Inhibitor Prescribed? (i.e. Plavix etc.-Includes Medically Managed Patients): Yes For EF <40%, Aldosterone Inhibitor Prescribed? Yes Was EF assessed during THIS hospitalization? Yes Was Cardiac Rehab II ordered? (Included Medically managed Patients): Yes   Outstanding Labs/Studies   LFTs, and FLP in 6 weeks.  Duration of Discharge Encounter   Greater than 30 minutes including physician time.  Signed, Laverda Page NP-C 09/26/2015, 2:31 PM

## 2015-09-25 NOTE — Telephone Encounter (Signed)
Patient husband dropped off FMLA and Payment-he understands the process.

## 2015-09-25 NOTE — Progress Notes (Addendum)
SUBJECTIVE:  No complaints  OBJECTIVE:   Vitals:   Vitals:   09/24/15 1805 09/24/15 2055 09/25/15 0400 09/25/15 0833  BP: 115/81 109/77 (!) 91/58 121/83  Pulse:  81 77 84  Resp:  18 18   Temp:  98.6 F (37 C) 98.4 F (36.9 C)   TempSrc:  Oral Oral   SpO2:  95% 99%   Weight:      Height:       I&O's:   Intake/Output Summary (Last 24 hours) at 09/25/15 0905 Last data filed at 09/24/15 1800  Gross per 24 hour  Intake           463.13 ml  Output                0 ml  Net           463.13 ml   TELEMETRY: Reviewed telemetry pt in NSR:     PHYSICAL EXAM General: Well developed, well nourished, in no acute distress Head: Eyes PERRLA, No xanthomas.   Normal cephalic and atramatic  Lungs:   Clear bilaterally to auscultation and percussion. Heart:   HRRR S1 S2 Pulses are 2+ & equal.            No carotid bruit. No JVD.  No abdominal bruits. No femoral bruits. Abdomen: Bowel sounds are positive, abdomen soft and non-tender without masses  Msk:  Back normal, normal gait. Normal strength and tone for age. Extremities:   No clubbing, cyanosis or edema.  DP +1 Neuro: Alert and oriented X 3. Psych:  Good affect, responds appropriately   LABS: Basic Metabolic Panel: No results for input(s): NA, K, CL, CO2, GLUCOSE, BUN, CREATININE, CALCIUM, MG, PHOS in the last 72 hours. Liver Function Tests:  Recent Labs  09/24/15 1145  AST 34  ALT 31  ALKPHOS 56  BILITOT 0.4  PROT 6.5  ALBUMIN 3.0*   No results for input(s): LIPASE, AMYLASE in the last 72 hours. CBC:  Recent Labs  09/24/15 0244 09/25/15 0259  WBC 9.2 8.8  HGB 11.9* 12.2  HCT 36.8 37.8  MCV 89.8 90.9  PLT 244 270   Cardiac Enzymes: No results for input(s): CKTOTAL, CKMB, CKMBINDEX, TROPONINI in the last 72 hours. BNP: Invalid input(s): POCBNP D-Dimer: No results for input(s): DDIMER in the last 72 hours. Hemoglobin A1C: No results for input(s): HGBA1C in the last 72 hours. Fasting Lipid Panel: No  results for input(s): CHOL, HDL, LDLCALC, TRIG, CHOLHDL, LDLDIRECT in the last 72 hours. Thyroid Function Tests:  Recent Labs  09/24/15 1136  TSH 6.184*   Anemia Panel: No results for input(s): VITAMINB12, FOLATE, FERRITIN, TIBC, IRON, RETICCTPCT in the last 72 hours. Coag Panel:   Lab Results  Component Value Date   INR 1.12 09/20/2015    RADIOLOGY: No results found.  ASSESSMENT/PLAN:  50 year old female with no prior cardiac history. She presented to [facility] on 09/20/15 with chest pain with radiation to her right and left arms with associated nausea and vomiting. She initially was sent home but returned to the ED at [facility] a few hours later with worsening chest pain, EKG showed ST elevation in lateral and precordial leads. Code STEMI was activated, she received a DES to her LAD on 09/20/15.  Underwent relook cath on 7/28for ongoing CP and persistent ST elevation. LAD stent patent.  1. STEMI involving left anterior descending coronary artery: --s/p DES to LAD --relook cath 7/28 ok   --continue DAPT with ASA  and Brilinta.   --continue BB/ACE I and spiro with PRN Lasix.   --continue cardiac rehab.    --check outpt FLP and ALT in 6 weeks.     --Ideally would switch OCP to IUD given prothrombotic nature of OCPs. She will d/w her gynecologist in 2 weeks.   2. Ischemic CM EF 30-35% with persistent ST elevation on ECG - LifeVest has been ordered.  Continue BB/ACE I and aldactone with PRN lasix.   3. Hypothyroidism -  Continue synthroid.  TSH mildly elevated.  Will have her followup with her PCP for adjustment of her synthroid.  4. Elevated LFTs ? Shock liver - repeat this am shows normalization of LFTs.   5. ASCAD with occluded mid LAD and 20% RCA.  Continue DAPT.  6. PAF now back in NSR on IV Amio and Heparin.  Change IV Amio to 200mg  BID and continue BB.  If remains in NSR consider stopping Amio in the next few weeks.  This patients CHA2DS2-VASc Score and  unadjusted Ischemic Stroke Rate (% per year) is equal to 2.2 % stroke rate/year from a score of 2. Above score calculated as 1 point each if present [CHF, HTN, DM, Vascular=MI/PAD/Aortic Plaque, Age if 65-74, or Female].Above score calculated as 2 points each if present [Age > 75, or Stroke/TIA/TE].  Now on Xarelto 20mg  daily.  Will continue DAPT for 1 month then stop ASA.  Discussed with Dr. Clifton James and given her low bleeding risk, will keep on ASA/Brilinta and Xarelto for 1 month then stop ASA.  Patient is stable from a cardiac standpoint for discharge to home.  She has LifeVest in place and will need repeat 2D ech oin 2 months to assess for improvement in LVF.  If EF still < 35% at that time, then will need referral to EP for AICD. She will need early followup with PCP for management of her hypothyroidism and early TOC followup in our office.  Would stop ASA after 1 month due to need for Xarelto and Brilinta.     Armanda Magic, MD  09/25/2015  9:05 AM

## 2015-09-25 NOTE — Telephone Encounter (Signed)
New message    The husband is bringing the paperwork to drop to be filled-out    1. Are you calling in reference to your FMLA or disability form? disability  2. What is your question in regards to FMLA or disability form? no   3. Do you need copies of your medical records? no  4. Are you waiting on a nurse to call you back with results or are you wanting copies of your results? Just contact the husband he is dropping off the paper  work he is going to leave it at the front desk

## 2015-09-26 DIAGNOSIS — E039 Hypothyroidism, unspecified: Secondary | ICD-10-CM

## 2015-09-26 MED ORDER — AMIODARONE HCL 200 MG PO TABS: 200.0000 mg | ORAL_TABLET | Freq: Two times a day (BID) | ORAL | 11 refills | Status: DC

## 2015-09-26 MED ORDER — SPIRONOLACTONE 25 MG PO TABS: 25.0000 mg | ORAL_TABLET | Freq: Every day | ORAL | 11 refills | Status: DC

## 2015-09-26 MED ORDER — ASPIRIN 81 MG PO CHEW: 81.0000 mg | CHEWABLE_TABLET | Freq: Every day | ORAL | Status: DC

## 2015-09-26 MED ORDER — TICAGRELOR 90 MG PO TABS: 90.0000 mg | ORAL_TABLET | Freq: Two times a day (BID) | ORAL | 0 refills | Status: DC

## 2015-09-26 MED ORDER — RIVAROXABAN 20 MG PO TABS: 20.0000 mg | ORAL_TABLET | Freq: Every day | ORAL | 0 refills | Status: DC

## 2015-09-26 MED ORDER — FUROSEMIDE 20 MG PO TABS: 20.0000 mg | ORAL_TABLET | ORAL | 4 refills | Status: DC

## 2015-09-26 MED ORDER — CARVEDILOL 6.25 MG PO TABS: 6.2500 mg | ORAL_TABLET | Freq: Two times a day (BID) | ORAL | 11 refills | Status: DC

## 2015-09-26 MED ORDER — LISINOPRIL 2.5 MG PO TABS: 2.5000 mg | ORAL_TABLET | Freq: Every day | ORAL | 11 refills | Status: DC

## 2015-09-26 MED ORDER — RIVAROXABAN 20 MG PO TABS: 20.0000 mg | ORAL_TABLET | Freq: Every day | ORAL | 11 refills | Status: DC

## 2015-09-26 MED ORDER — ATORVASTATIN CALCIUM 80 MG PO TABS: 80.0000 mg | ORAL_TABLET | Freq: Every day | ORAL | 11 refills | Status: DC

## 2015-09-26 MED ORDER — NITROGLYCERIN 0.4 MG SL SUBL: 0.4000 mg | SUBLINGUAL_TABLET | SUBLINGUAL | 3 refills | Status: DC | PRN

## 2015-09-26 MED ORDER — PANTOPRAZOLE SODIUM 40 MG PO TBEC: 40.0000 mg | DELAYED_RELEASE_TABLET | Freq: Every day | ORAL | 11 refills | Status: DC

## 2015-09-26 MED ORDER — TICAGRELOR 90 MG PO TABS: 90.0000 mg | ORAL_TABLET | Freq: Two times a day (BID) | ORAL | 10 refills | Status: DC

## 2015-09-26 MED FILL — XARELTO 20 MG TABLET: 20 | 30 days supply | Qty: 30 | Fill #0

## 2015-09-26 MED FILL — BRILINTA 90 MG TABLET: 90 | 30 days supply | Qty: 60 | Fill #0

## 2015-09-26 NOTE — Progress Notes (Signed)
SUBJECTIVE:  No complaints.  Wants to go home.  OBJECTIVE:   Vitals:   Vitals:   09/25/15 0833 09/25/15 1305 09/25/15 1734 09/25/15 2040  BP: 121/83 (!) 99/59 (!) 123/58 107/62  Pulse: 84 90  (!) 101  Resp:  20  18  Temp:  98.4 F (36.9 C)  98.2 F (36.8 C)  TempSrc:  Oral  Oral  SpO2:  100%  98%  Weight:      Height:       I&O's:   Intake/Output Summary (Last 24 hours) at 09/26/15 0827 Last data filed at 09/25/15 2201  Gross per 24 hour  Intake              723 ml  Output                0 ml  Net              723 ml   TELEMETRY: Reviewed telemetry pt in NSR:     PHYSICAL EXAM General: Well developed, well nourished, in no acute distress Head: Eyes PERRLA, No xanthomas.   Normal cephalic and atramatic  Lungs:   Clear bilaterally to auscultation and percussion. Heart:   HRRR S1 S2 Pulses are 2+ & equal.            No carotid bruit. No JVD.  No abdominal bruits. No femoral bruits. Abdomen: Bowel sounds are positive, abdomen soft and non-tender without masses Msk:  Back normal, normal gait. Normal strength and tone for age. Extremities:   No clubbing, cyanosis or edema.  DP +1 Neuro: Alert and oriented X 3. Psych:  Good affect, responds appropriately   LABS: Basic Metabolic Panel: No results for input(s): NA, K, CL, CO2, GLUCOSE, BUN, CREATININE, CALCIUM, MG, PHOS in the last 72 hours. Liver Function Tests:  Recent Labs  09/24/15 1145  AST 34  ALT 31  ALKPHOS 56  BILITOT 0.4  PROT 6.5  ALBUMIN 3.0*   No results for input(s): LIPASE, AMYLASE in the last 72 hours. CBC:  Recent Labs  09/24/15 0244 09/25/15 0259  WBC 9.2 8.8  HGB 11.9* 12.2  HCT 36.8 37.8  MCV 89.8 90.9  PLT 244 270   Cardiac Enzymes: No results for input(s): CKTOTAL, CKMB, CKMBINDEX, TROPONINI in the last 72 hours. BNP: Invalid input(s): POCBNP D-Dimer: No results for input(s): DDIMER in the last 72 hours. Hemoglobin A1C: No results for input(s): HGBA1C in the last 72  hours. Fasting Lipid Panel: No results for input(s): CHOL, HDL, LDLCALC, TRIG, CHOLHDL, LDLDIRECT in the last 72 hours. Thyroid Function Tests:  Recent Labs  09/24/15 1136  TSH 6.184*   Anemia Panel: No results for input(s): VITAMINB12, FOLATE, FERRITIN, TIBC, IRON, RETICCTPCT in the last 72 hours. Coag Panel:   Lab Results  Component Value Date   INR 1.12 09/20/2015    RADIOLOGY: No results found.  ASSESSMENT/PLAN:  50 year old female with no prior cardiac history. She presented to [facility] on 09/20/15 with chest pain with radiation to her right and left arms with associated nausea and vomiting. She initially was sent home but returned to the ED at [facility] a few hours later with worsening chest pain, EKG showed ST elevation in lateral and precordial leads. Code STEMI was activated, she received a DES to her LAD on 09/20/15.Underwent relook cath on 7/28for ongoing CP and persistent ST elevation. LAD stent patent.  1. STEMI involving left anterior descending coronary artery: --s/p DES to LAD --  relook cath 7/28 ok --continue DAPT with ASA and Brilinta. --continue BB/ACE I and spiro with PRN Lasix. --continue cardiac rehab.  --check outpt FLP and ALT in 6 weeks.  --Ideally would switch OCP to IUD given prothrombotic nature of OCPs. She will d/w her gynecologist in 2 weeks.   2. Ischemic CM EF 30-35% with persistent ST elevation on ECG - LifeVest has been ordered. Continue BB/ACE I and aldactone with PRN lasix. Awaiting approval from insurance for LifeVest.   3. Hypothyroidism - Continue synthroid. TSH mildly elevated.  Will have her followup with her PCP for adjustment of her synthroid.  4. Elevated LFTs ? Shock liver - repeat shows normalization of LFTs.   5. ASCAD with occluded mid LAD and 20% RCA. Continue DAPT.  6. PAF now back in NSR on IV Amio and Heparin. Change IV Amio to 200mg  BID and continue BB. If remains in NSR  consider stopping Amio in the next few weeks. This patients CHA2DS2-VASc Score and unadjusted Ischemic Stroke Rate (% per year) is equal to2.2% stroke rate/year from a score of 2.Above score calculated as 1 point each if present [CHF, HTN, DM, Vascular=MI/PAD/Aortic Plaque, Age if 65-74, or Female].Above score calculated as 2 points each if present [Age >75, or Stroke/TIA/TE]. Now on Xarelto 20mg  daily.  Will continue DAPT for 1 month then stop ASA. Discussed with Dr. Clifton James and given her low bleeding risk, will keep on ASA/Brilinta and Xarelto for 1 month then stop ASA.  Patient is stable from a cardiac standpoint for discharge to home.  Her discharge has been on hold due to waiting for insurance to approve her LifeVest.  Hopefully this will be approved today and she will be discharged to home.  Will need repeat 2D ech oin 2 months to assess for improvement in LVF.  If EF still < 35% at that time, then will need referral to EP for AICD. She will need early followup with PCP for management of her hypothyroidism and early TOC followup in our office.  Would stop ASA after 1 month due to need for Xarelto and Brilinta.      Armanda Magic, MD  09/26/2015  8:27 AM

## 2015-09-26 NOTE — Progress Notes (Signed)
Rx for Brilinta and Lesia Hausen handed to patient. Rx cards given yesterday. Verified [facility] with both meds in stock. Reviewed with patient directions to pharmacy. Updated nurse patient ready for DC and pharmacy closing at 5:00.

## 2015-09-26 NOTE — Progress Notes (Signed)
  I spent well over 2 hours the past 2 days on the telephone with [employer] trying to secure coverage approval for Ms. Tackman LifeVest.  Her coverage has been denied. I appealed the decision and today spoke with Juliann Mule, MD (a Family Practice physician) Phone # [phone] in a peer-to-peer format regarding the denial. After I explained the details of the case and the recommendation from our interventional and HF teams that she have a LifeVest place PRIOR to discharge, he again denied the coverage request citing company policy based on the [employer] Trial. I informed him that new data has become available since this trial and he said that if we waned  a Cardiology-specific appeal through [employer] it could take up to 10 days and that we would be required to supply citations for them to review.   As the patient, is no longer under my care, I provided this information to the treating team and our [employer] representative.   Mylene Bow,MD 11:26 AM

## 2015-09-26 NOTE — Progress Notes (Signed)
Spoke with Damien Fusi [phone]) [employer] Rep for Life Vest. Insurance auth still pending. Dr Gala Romney did peer to peer yesterday. Earvin Hansen will contact me when he hears of approval from insurance. Anticipate DC today if approved.

## 2015-10-02 MED ORDER — CARVEDILOL 6.25 MG PO TABS: 3.1250 mg | ORAL_TABLET | Freq: Two times a day (BID) | ORAL | 11 refills | Status: DC

## 2015-10-02 NOTE — Telephone Encounter (Signed)
I would decrease the Coreg to 3.125 mg BID if she is feeling fatigued. Thanks, chris

## 2015-10-02 NOTE — Telephone Encounter (Signed)
I spoke with pt's husband and gave him instructions from Dr. Clifton James. He will cut 6.25 mg tablets in half and does not want new prescription sent in at this time.  He asked I contact pt  with this information.   I spoke with pt and gave her instructions from Dr. Clifton James. Med list updated.

## 2015-10-02 NOTE — Telephone Encounter (Signed)
I placed call to home number and spoke with pt. I told her pt's husband had contacted our office today and pt gave me verbal permission to contact her husband.  She reports his cell number is [phone]. Pt reports she is doing OK. Reports she tires quickly but no other complaints.  I called and spoke with pt's husband. He reports pt is sleeping a lot.  Sleeps 12 hours at night and then naps during the day.  He reports she is feeling depressed.  She is not having any chest pain or shortness of breath.  I told husband tiredness could be related to recent cardiac event and hospitalization.  Pt does have a primary care provider and I told husband pt may want to follow up with him regarding depression.  Pt has appt with [place] on 10/10/15 and is planning on being here for this appt.  I told pt's husband I would send message to Dr. Clifton James regarding pt's fatigue and we would call him back if he had any new recommendations.

## 2015-10-02 NOTE — Telephone Encounter (Signed)
Follow up       Husband is concerned that wife is sleeping about 16hrs a day.  She does not complain that anything hurts.  Please advise

## 2015-10-10 ENCOUNTER — Encounter: Payer: Self-pay | Admitting: Cardiology

## 2015-10-10 ENCOUNTER — Ambulatory Visit (INDEPENDENT_AMBULATORY_CARE_PROVIDER_SITE_OTHER): Payer: BLUE CROSS/BLUE SHIELD | Admitting: Cardiology

## 2015-10-10 VITALS — BP 90/60 | HR 55 | Ht 69.0 in | Wt 152.0 lb

## 2015-10-10 DIAGNOSIS — E785 Hyperlipidemia, unspecified: Secondary | ICD-10-CM

## 2015-10-10 DIAGNOSIS — R7303 Prediabetes: Secondary | ICD-10-CM | POA: Diagnosis not present

## 2015-10-10 DIAGNOSIS — I2102 ST elevation (STEMI) myocardial infarction involving left anterior descending coronary artery: Secondary | ICD-10-CM | POA: Diagnosis not present

## 2015-10-10 MED ORDER — SPIRONOLACTONE 25 MG PO TABS: 12.5000 mg | ORAL_TABLET | Freq: Every day | ORAL | 11 refills | Status: DC

## 2015-10-10 NOTE — Patient Instructions (Addendum)
Medication Instructions:   START TAKING SPIRONLACTONE 12.5 MG ONCE  DAY   If you need a refill on your cardiac medications before your next appointment, please call your pharmacy.  Labwork: TODAY BMET  AND HGBA1C  RETURN IN 2 TO 3 WEEKS FOR FASTING LIPIDS    Testing/Procedures:  NONE ORDER TODAY    Follow-Up: IN 2 TO 3 WEEKS WITH AN AVAIABLE PA/NP  FOR MEDICATION ADJUSMENTS    Any Other Special Instructions Will Be Listed Below (If Applicable).  YOU HAVE BEEN RECCOMMENDED FOR CARDIAC REHAB PHASE TWO YOU WILL BE CONTACTED BY REHAB FACILITY FOR FURTHER  STEPS

## 2015-10-10 NOTE — Progress Notes (Signed)
10/10/2015 Catherine Mays   [birth year]  [id number]  Primary Physician VASIREDDY,SABITHA, MD Primary Cardiologist: Dr. Clifton JamesMcAlhany   Reason for Visit/CC: Post hospital follow-up for CAD, status post STEMI plus systolic heart failure  HPI:  Patient is a 50 year old female with no previous cardiac history until recently. [facility] with chest pain and EKG diagnostic for  Acute STEMI. She underwent emergent left heart catheterization which revealed an occluded proximal to mid LAD. She successfully was treated with PTCA/drug-eluting stenting. There was mild residual 30% mid RCA disease. She was also noted to have severe LV dysfunction. EF was 30-35%. There was no evidence of left ventricular thrombus on echocardiogram. During her hospitalization she also developed rapid atrial fibrillation with rates in the 140s and was treated with IV amiodarone and heparin. She converted to normal sinus rhythm and was transitioned to PO amiodarone. She was also transitioned to Xarelto for anticoagulation given her A. fib and low EF. She was discharged home on triple therapy with aspirin, Brilinta and Xarelto, with plans to discontinue aspirin after one month of therapy. She was also discharged home with a LifeVest for primary prevention of sudden cardiac death. Plan is for repeat echocardiogram in 2-3 months after maximal medical therapy for heart failure. If repeat echocardiogram reveals EF of less than 35%, she will need referral to EP for consideration for ICD.  She presents to clinic today for follow-up. She is accompanied by her husband. She denies any recurrent anginal symptomatology. No chest pain and no dyspnea. She has been fully compliant with her medications. She has been tolerating these fairly well. Her blood pressure is soft but stable at 90/60. Her pulse rate is 55. She denies feeling lightheaded or dizzy and no syncope/near-syncope, however she does feel tired and  fatigue. She is wearing her LifeVest today and reports that she has been fully compliant with this. She has not received any shocks. EKG today shows sinus rhythm with a few PVCs. She denies any symptoms of breakthrough atrial fibrillation. Eyes any symptoms of fluid retention. No dyspnea, no orthopnea, PND or lower chimney edema. She denies any weight gain at home. She has noticed weight loss and decreased appetite. She also reports that she feels a little depressed given her overall condition, however she notes that she has very good support at home through her family and church group.    Current Outpatient Prescriptions  Medication Sig Dispense Refill  . amiodarone (PACERONE) 200 MG tablet Take 1 tablet (200 mg total) by mouth 2 (two) times daily. 60 tablet 11  . aspirin 81 MG chewable tablet Chew 1 tablet (81 mg total) by mouth daily.    Marland Kitchen. atorvastatin (LIPITOR) 80 MG tablet Take 1 tablet (80 mg total) by mouth daily at 6 PM. 30 tablet 11  . Calcium Carb-Cholecalciferol (CALCIUM + D3 PO) Take 1 tablet by mouth every morning.    . carvedilol (COREG) 6.25 MG tablet Take 0.5 tablets (3.125 mg total) by mouth 2 (two) times daily with a meal. 60 tablet 11  . furosemide (LASIX) 20 MG tablet Take 1 tablet (20 mg total) by mouth as directed. 30 tablet 4  . lisinopril (PRINIVIL,ZESTRIL) 2.5 MG tablet Take 1 tablet (2.5 mg total) by mouth daily. 30 tablet 11  . nitroGLYCERIN (NITROSTAT) 0.4 MG SL tablet Place 1 tablet (0.4 mg total) under the tongue every 5 (five) minutes as needed for chest pain. 25 tablet 3  . pantoprazole (PROTONIX) 40 MG tablet  Take 1 tablet (40 mg total) by mouth daily. 30 tablet 11  . rivaroxaban (XARELTO) 20 MG TABS tablet Take 1 tablet (20 mg total) by mouth daily with supper. 30 tablet 0  . spironolactone (ALDACTONE) 25 MG tablet Take 0.5 tablets (12.5 mg total) by mouth daily. 30 tablet 11  . SYNTHROID 125 MCG tablet Take 125 mcg by mouth every morning.  5  . ticagrelor  (BRILINTA) 90 MG TABS tablet Take 1 tablet (90 mg total) by mouth 2 (two) times daily. 60 tablet 0  . triamcinolone (NASACORT ALLERGY 24HR) 55 MCG/ACT AERO nasal inhaler Place 2 sprays into the nose daily.     No current facility-administered medications for this visit.    Facility-Administered Medications Ordered in Other Visits  Medication Dose Route Frequency Provider Last Rate Last Dose  . bivalirudin (ANGIOMAX) 250 mg in sodium chloride 0.9 % 50 mL (5 mg/mL) infusion    Continuous PRN Kathleene Hazel, MD   Stopped at 09/20/15 [phone]  . bivalirudin (ANGIOMAX) BOLUS via infusion    PRN Kathleene Hazel, MD   54.45 mg at 09/20/15 9604  . fentaNYL (SUBLIMAZE) injection    PRN Kathleene Hazel, MD   25 mcg at 09/20/15 0651  . heparin 1,500 mL    PRN Kathleene Hazel, MD      . heparin infusion 2 units/mL in 0.9 % sodium chloride    Continuous PRN Kathleene Hazel, MD   1,500 mL at 09/20/15 0733  . iopamidol (ISOVUE-370) 76 % injection    PRN Kathleene Hazel, MD   165 mL at 09/20/15 0733  . lidocaine (PF) (XYLOCAINE) 1 % injection    PRN Kathleene Hazel, MD   2 mL at 09/20/15 5409  . midazolam (VERSED) injection    PRN Kathleene Hazel, MD   1 mg at 09/20/15 [phone]  . nitroGLYCERIN 1 mg/10 ml (100 mcg/ml) - IR/CATH LAB    PRN Kathleene Hazel, MD   200 mcg at 09/20/15 1478  . Radial Cocktail/Verapamil only    PRN Kathleene Hazel, MD   10 mL at 09/20/15 0629  . ticagrelor (BRILINTA) tablet    PRN Kathleene Hazel, MD   180 mg at 09/20/15 [phone]  . tirofiban (AGGRASTAT) bolus via infusion    PRN Kathleene Hazel, MD   1,815 mcg at 09/20/15 [phone]  . tirofiban (AGGRASTAT) infusion 50 mcg/mL 100 mL    Continuous PRN Kathleene Hazel, MD 13.1 mL/hr at 09/20/15 0651 0.15 mcg/kg/min at 09/20/15 0651    No Known Allergies  Social History   Social History  . Marital status: Married    Spouse name: N/A  . Number of children:  N/A  . Years of education: N/A   Occupational History  . Not on file.   Social History Main Topics  . Smoking status: Never Smoker  . Smokeless tobacco: Never Used  . Alcohol use No  . Drug use: No  . Sexual activity: Not on file   Other Topics Concern  . Not on file   Social History Narrative  . No narrative on file     Review of Systems: General: negative for chills, fever, night sweats or weight changes.  Cardiovascular: negative for chest pain, dyspnea on exertion, edema, orthopnea, palpitations, paroxysmal nocturnal dyspnea or shortness of breath Dermatological: negative for rash Respiratory: negative for cough or wheezing Urologic: negative for hematuria Abdominal: negative for nausea, vomiting, diarrhea, bright red blood per  rectum, melena, or hematemesis Neurologic: negative for visual changes, syncope, or dizziness All other systems reviewed and are otherwise negative except as noted above.    Blood pressure 90/60, pulse (!) 55, height 5\' 9"  (1.753 m), weight 152 lb (68.9 kg).  General appearance: alert, cooperative and no distress Neck: no carotid bruit and no JVD Lungs: clear to auscultation bilaterally Heart: regular rate and rhythm, S1, S2 normal, no murmur, click, rub or gallop Extremities: no LEE Pulses: 2+ and symmetric Skin: warm and dry Neurologic: Grossly normal  EKG NSR with PVC  ASSESSMENT AND PLAN:   1. CAD: Status post STEMI secondary to proximal to mid LAD successfully treated with PCI plus drug-eluting stenting. Also with mild residual 30% mid RCA disease treated medically. She denies any recurrent anginal symptomatology. Continue medical therapy for secondary prevention. Continue aspirin and Brilinta, high intensity statin, carvedilol and lisinopril. Plan is to discontinue aspirin on September 2 (continued Brilinta plus Xarelto). I do not see where a lipid panel was checked during her recent hospitalization. We will check a fasting lipid panel  to assess her cholesterol levels.  2. Ischemic cardiomyopathy/systolic heart failure: EF post MI was 30-35%. Echo was negative for LV thrombus. She has a LifeVest for primary prevention of sudden cardiac death. She is euvolemic on physical exam without signs and symptoms of acute CHF. Continue guidline directed medical therapy for systolic heart failure. Continue carvedilol, lisinopril and spironolactone. Unfortunately, her blood pressure and heart rate are both too soft to allow for upper titration of her medications. Her blood pressure is 90/60 and her pulse rate is 55 bpm. She denies symptoms of dizziness, lightheadedness, syncope/near-syncope. However she does note issues with fatigue. Thus, for this reason we will keep her carvedilol dose at 3.125 mg twice a day and her lisinopril at 2.5 mg daily. We will cut the dose of I Spironolactone down to 12.5 mg daily. Given she is on Lasix, lisinopril and spironolactone, we will check a BMP today to assess renal function and electrolytes. She will need a repeat echocardiogram in 2-3 months after her medications have been titrated to the maximum tolerated doses. If EF remains less than 35%, she will need referral to EP for consideration for ICD.  3. Paroxysmal atrial fibrillation: EKG shows normal sinus rhythm. She denies any signs and symptoms of breakthrough atrial fibrillation. Pulse rate is well-controlled. We will continue her on PO amiodarone plus carvedilol for rhythm/rate control. We'll also continue her on Xarelto for anticoagulation. She is currently on triple therapy given recent coronary stenting as well. Plan is to continue this for total of 30 days. After which she will discontinue aspirin.  PLAN  F/u in 2-3 weeks for f/u for HF and for medication titration.   Robbie Lis PA-C 10/10/2015 5:04 PM

## 2015-10-11 ENCOUNTER — Telehealth: Payer: Self-pay | Admitting: Cardiovascular Disease

## 2015-10-11 LAB — BASIC METABOLIC PANEL
BUN: 31 mg/dL — ABNORMAL HIGH (ref 7–25)
CO2: 23 mmol/L (ref 20–31)
Calcium: 9 mg/dL (ref 8.6–10.2)
Chloride: 99 mmol/L (ref 98–110)
Creat: 1.65 mg/dL — ABNORMAL HIGH (ref 0.50–1.10)
Glucose, Bld: 87 mg/dL (ref 65–99)
Potassium: 4.3 mmol/L (ref 3.5–5.3)
Sodium: 136 mmol/L (ref 135–146)

## 2015-10-11 LAB — HEMOGLOBIN A1C
Hgb A1c MFr Bld: 6.2 % — ABNORMAL HIGH (ref <5.7)
Mean Plasma Glucose: 131 mg/dL

## 2015-10-11 NOTE — Telephone Encounter (Signed)
New message      Pt wants to know if the use of Xarelto and Brilinta would increase the flow of her menstrual cycle. Please call.

## 2015-10-11 NOTE — Telephone Encounter (Signed)
Called patient back about her message. Informed patient that she will have a heavier menstrual cycle being on Xarelto and Brilinta. Patient verbalized understanding and will call with any other questions or concerns.

## 2015-10-16 ENCOUNTER — Telehealth: Payer: Self-pay | Admitting: *Deleted

## 2015-10-16 ENCOUNTER — Telehealth: Payer: Self-pay | Admitting: Cardiovascular Disease

## 2015-10-16 DIAGNOSIS — I480 Paroxysmal atrial fibrillation: Secondary | ICD-10-CM

## 2015-10-16 NOTE — Telephone Encounter (Signed)
I spoke with Misty Stanley at Dr. Jeannette How office who is asking if OK for pt to have routine cleaning and if she will need antibiotic prior to cleaning.  Pt is scheduled to see Ronie Spies, PA on 10/24/15.  Pt is currently wearing life vest.  As this is routine cleaning I asked Misty Stanley to reschedule cleaning until after pt seen in office on 10/24/15.

## 2015-10-16 NOTE — Telephone Encounter (Signed)
I spoke with pt and she will discuss cleaning and dental visit with Ronie Spies, PA at upcoming appt. I told pt Dr. Clifton James would be back in office on 8/24 and could complete FMLA paperwork at that time.  Pt requests paperwork be faxed. I confirmed with medical records that paperwork could be faxed. Pt's FMLA paperwork should be faxed to [employer] at fax number noted on paperwork Pt states husband's FMLA paperwork should be faxed to [phone]- Attention: [place]

## 2015-10-16 NOTE — Telephone Encounter (Signed)
New Message  Pt call requesting to speak with RN. Pt stating she was f/u on her short term disability paperwork. Pt states the deadline is approaching. Please call back to discuss

## 2015-10-16 NOTE — Telephone Encounter (Signed)
New message        FYI     This is for the pt reg dental cleaning. Pt was told that the dental office had to call and let the office know that her appt with the dentist is for her regular cleaning. Please advise.

## 2015-10-16 NOTE — Telephone Encounter (Signed)
I placed a call to [facility] at Dr. Jeannette How office and left message to call back

## 2015-10-16 NOTE — Telephone Encounter (Signed)
-----   Message from Allayne Butcher, [place] sent at 10/15/2015  4:13 PM EDT ----- Instruct patient to hold spironolactone given increase in SCr and BUN. Continue all other meds. Get repeat BMP when she follows up with [facility]. Hgb A1c suggest prediabetes. She will need to have her PCP follow this in 6 months.

## 2015-10-16 NOTE — Telephone Encounter (Signed)
Pt aware of lab results.  She has been advised to hold spironolactone and repeat bmet on 10/24/15 at o/v with Ronie Spies, PA-C. Pt agreeable with this plan an verbalized understanding. BMET order put in EPIC.

## 2015-10-18 NOTE — Telephone Encounter (Signed)
I spoke with Catherine Mays and told her Dr. Clifton James had completed his part of paperwork. On Catherine Mays's paperwork there is a section she needs to complete. Catherine Mays requests we fax part completed by Dr. Clifton James to [employer].  She will come to office tomorrow to complete rest of paperwork. Will fax.  On husband's paperwork there is a section he needs to sign.  Catherine Mays will come in tomorrow to sign.  Paperwork given to medical records.

## 2015-10-22 ENCOUNTER — Encounter (HOSPITAL_COMMUNITY)
Admission: RE | Admit: 2015-10-22 | Discharge: 2015-10-22 | Disposition: A | Discharge status: Home / Self Care | Payer: BLUE CROSS/BLUE SHIELD | Source: Ambulatory Visit | Attending: Cardiovascular Disease | Admitting: Cardiovascular Disease

## 2015-10-22 ENCOUNTER — Encounter (HOSPITAL_COMMUNITY): Payer: Self-pay

## 2015-10-22 VITALS — BP 90/60 | HR 74 | Ht 69.0 in | Wt 153.2 lb

## 2015-10-22 DIAGNOSIS — I213 ST elevation (STEMI) myocardial infarction of unspecified site: Secondary | ICD-10-CM | POA: Diagnosis not present

## 2015-10-22 DIAGNOSIS — Z955 Presence of coronary angioplasty implant and graft: Secondary | ICD-10-CM | POA: Insufficient documentation

## 2015-10-22 NOTE — Progress Notes (Signed)
Cardiac/Pulmonary Rehab Medication Review by a Pharmacist  Does the patient  feel that his/her medications are working for him/her?  yes  Has the patient been experiencing any side effects to the medications prescribed?  no  Does the patient measure his/her own blood pressure or blood glucose at home?  no   Does the patient have any problems obtaining medications due to transportation or finances?   no  Understanding of regimen: good Understanding of indications: good Potential of compliance: excellent  Questions asked to Determine Patient Understanding of Medication Regimen:  1. What is the name of the medication?  2. What is the medication used for?  3. When should it be taken?  4. How much should be taken?  5. How will you take it?  6. What side effects should you report?  Understanding Defined as: Excellent: All questions above are correct Good: Questions 1-4 are correct Fair: Questions 1-2 are correct  Poor: 1 or none of the above questions are correct   Pharmacist comments: Pt does not c/o any side effects from medications.  Pt states spironolactone was stopped recently.  Pt does not check BP at home but has a machine and recommended she consider starting to check BP and keep a journal to show MD.  Medication list updated.    Valrie Hart A 10/22/2015 1:33 PM

## 2015-10-22 NOTE — Progress Notes (Signed)
Cardiac Individual Treatment Plan  Patient Details  Name: Catherine Mays MRN: [id number] Date of Birth: 06/20/1965 Referring Provider:    Initial Encounter Date:  Flowsheet Row CARDIAC REHAB PHASE II ORIENTATION from 10/22/2015 in [place] CARDIAC REHABILITATION  Date  10/22/15      Visit Diagnosis: ST elevation myocardial infarction (STEMI), unspecified artery (HCC)  Stented coronary artery  Patient's Home Medications on Admission:  Current Outpatient Prescriptions:  .  amiodarone (PACERONE) 200 MG tablet, Take 1 tablet (200 mg total) by mouth 2 (two) times daily. (Patient taking differently: Take 200 mg by mouth at bedtime. ), Disp: 60 tablet, Rfl: 11 .  aspirin 81 MG chewable tablet, Chew 1 tablet (81 mg total) by mouth daily., Disp: , Rfl:  .  atorvastatin (LIPITOR) 80 MG tablet, Take 1 tablet (80 mg total) by mouth daily at 6 PM., Disp: 30 tablet, Rfl: 11 .  Calcium Carb-Cholecalciferol (CALCIUM + D3 PO), Take 1 tablet by mouth every morning., Disp: , Rfl:  .  carvedilol (COREG) 6.25 MG tablet, Take 0.5 tablets (3.125 mg total) by mouth 2 (two) times daily with a meal., Disp: 60 tablet, Rfl: 11 .  furosemide (LASIX) 20 MG tablet, Take 1 tablet (20 mg total) by mouth as directed., Disp: 30 tablet, Rfl: 4 .  lisinopril (PRINIVIL,ZESTRIL) 2.5 MG tablet, Take 1 tablet (2.5 mg total) by mouth daily., Disp: 30 tablet, Rfl: 11 .  nitroGLYCERIN (NITROSTAT) 0.4 MG SL tablet, Place 1 tablet (0.4 mg total) under the tongue every 5 (five) minutes as needed for chest pain., Disp: 25 tablet, Rfl: 3 .  pantoprazole (PROTONIX) 40 MG tablet, Take 1 tablet (40 mg total) by mouth daily., Disp: 30 tablet, Rfl: 11 .  rivaroxaban (XARELTO) 20 MG TABS tablet, Take 1 tablet (20 mg total) by mouth daily with supper., Disp: 30 tablet, Rfl: 0 .  SYNTHROID 125 MCG tablet, Take 125 mcg by mouth every morning., Disp: , Rfl: 5 .  ticagrelor (BRILINTA) 90 MG TABS tablet, Take 1 tablet (90 mg total) by mouth 2  (two) times daily., Disp: 60 tablet, Rfl: 0 .  triamcinolone (NASACORT ALLERGY 24HR) 55 MCG/ACT AERO nasal inhaler, Place 2 sprays into the nose daily., Disp: , Rfl:  No current facility-administered medications for this encounter.   Facility-Administered Medications Ordered in Other Encounters:  .  bivalirudin (ANGIOMAX) 250 mg in sodium chloride 0.9 % 50 mL (5 mg/mL) infusion, , , Continuous PRN, Kathleene Hazel, MD, Stopped at 09/20/15 [phone] .  bivalirudin (ANGIOMAX) BOLUS via infusion, , , PRN, Kathleene Hazel, MD, 54.45 mg at 09/20/15 9678 .  fentaNYL (SUBLIMAZE) injection, , , PRN, Kathleene Hazel, MD, 25 mcg at 09/20/15 0651 .  heparin 1,500 mL, , , PRN, Kathleene Hazel, MD .  heparin infusion 2 units/mL in 0.9 % sodium chloride, , , Continuous PRN, Kathleene Hazel, MD, 1,500 mL at 09/20/15 0733 .  iopamidol (ISOVUE-370) 76 % injection, , , PRN, Kathleene Hazel, MD, 165 mL at 09/20/15 0733 .  lidocaine (PF) (XYLOCAINE) 1 % injection, , , PRN, Kathleene Hazel, MD, 2 mL at 09/20/15 9381 .  midazolam (VERSED) injection, , , PRN, Kathleene Hazel, MD, 1 mg at 09/20/15 0651 .  nitroGLYCERIN 1 mg/10 ml (100 mcg/ml) - IR/CATH LAB, , , PRN, Kathleene Hazel, MD, 200 mcg at 09/20/15 0175 .  Radial Cocktail/Verapamil only, , , PRN, Kathleene Hazel, MD, 10 mL at 09/20/15 0629 .  ticagrelor (BRILINTA) tablet, , ,  PRN, Kathleene Hazel, MD, 180 mg at 09/20/15 [phone] .  tirofiban (AGGRASTAT) bolus via infusion, , , PRN, Kathleene Hazel, MD, 1,815 mcg at 09/20/15 [phone] .  tirofiban (AGGRASTAT) infusion 50 mcg/mL 100 mL, , , Continuous PRN, Kathleene Hazel, MD, Last Rate: 13.1 mL/hr at 09/20/15 0651, 0.15 mcg/kg/min at 09/20/15 5409  Past Medical History: Past Medical History:  Diagnosis Date  . DCM (dilated cardiomyopathy) (HCC) 09/24/2015  . Hypothyroidism 09/24/2015  . PAF (paroxysmal atrial fibrillation) (HCC)  09/24/2015  . Thyroid disease     Tobacco Use: History  Smoking Status  . Never Smoker  Smokeless Tobacco  . Never Used    Labs: Recent Review Flowsheet Data    Labs for ITP Cardiac and Pulmonary Rehab Latest Ref Rng & Units 10/10/2015   Hemoglobin A1c <5.7 % 6.2(H)      Capillary Blood Glucose: No results found for: GLUCAP   Exercise Target Goals: Date: 10/22/15  Exercise Program Goal: Individual exercise prescription set with THRR, safety & activity barriers. Participant demonstrates ability to understand and report RPE using BORG scale, to self-measure pulse accurately, and to acknowledge the importance of the exercise prescription.  Exercise Prescription Goal: Starting with aerobic activity 30 plus minutes a day, 3 days per week for initial exercise prescription. Provide home exercise prescription and guidelines that participant acknowledges understanding prior to discharge.  Activity Barriers & Risk Stratification:     Activity Barriers & Cardiac Risk Stratification - 10/22/15 1520      Activity Barriers & Cardiac Risk Stratification   Activity Barriers None   Cardiac Risk Stratification High      6 Minute Walk:     6 Minute Walk    Row Name 10/22/15 1422         6 Minute Walk   Phase Initial     Distance 1500 feet     Walk Time 6 minutes     # of Rest Breaks 0     MPH 2.84     METS 3.17     RPE 8     Perceived Dyspnea  7     VO2 Peak 15.73     Symptoms No     Resting HR 74 bpm     Resting BP 90/60     Max Ex. HR 94 bpm     Max Ex. BP 106/70     2 Minute Post BP 96/66        Initial Exercise Prescription:     Initial Exercise Prescription - 10/22/15 1400      Date of Initial Exercise RX and Referring Provider   Date 10/22/15     NuStep   Level 2   Watts 15   Minutes 15   METs 1.9     Elliptical   Level 1   Speed 1.5   Minutes 20   METs 1.9     Prescription Details   Frequency (times per week) 3   Duration Progress to 30  minutes of continuous aerobic without signs/symptoms of physical distress     Intensity   THRR REST +  30   THRR 40-80% of Max Heartrate [phone]   Ratings of Perceived Exertion 11-13   Perceived Dyspnea 0-4     Progression   Progression Continue progressive overload as per policy without signs/symptoms or physical distress.     Resistance Training   Training Prescription Yes   Weight 1   Reps 10-12  Perform Capillary Blood Glucose checks as needed.  Exercise Prescription Changes:   Exercise Comments:    Discharge Exercise Prescription (Final Exercise Prescription Changes):   Nutrition:  Target Goals: Understanding of nutrition guidelines, daily intake of sodium 1500mg , cholesterol 200mg , calories 30% from fat and 7% or less from saturated fats, daily to have 5 or more servings of fruits and vegetables.  Biometrics:     Pre Biometrics - 10/22/15 1425      Pre Biometrics   Height 5\' 9"  (1.753 m)   Weight 153 lb 3.5 oz (69.5 kg)   Waist Circumference 33 inches   Hip Circumference 37.5 inches   Waist to Hip Ratio 0.88 %   BMI (Calculated) 22.7   Triceps Skinfold 12 mm   % Body Fat 20.8 %   Grip Strength 70 kg   Flexibility 15 in   Single Leg Stand 60 seconds       Nutrition Therapy Plan and Nutrition Goals:   Nutrition Discharge: Rate Your Plate Scores:     Nutrition Assessments - 10/22/15 1522      MEDFICTS Scores   Pre Score 41      Nutrition Goals Re-Evaluation:   Psychosocial: Target Goals: Acknowledge presence or absence of depression, maximize coping skills, provide positive support system. Participant is able to verbalize types and ability to use techniques and skills needed for reducing stress and depression.  Initial Review & Psychosocial Screening:     Initial Psych Review & Screening - 10/22/15 1526      Initial Review   Current issues with Current Stress Concerns;Current Sleep Concerns;Current Depression   Source of  Stress Concerns Occupation     Family Dynamics   Good Support System? Yes     Barriers   Psychosocial barriers to participate in program There are no identifiable barriers or psychosocial needs.     Screening Interventions   Interventions Encouraged to exercise      Quality of Life Scores:     Quality of Life - 10/22/15 1426      Quality of Life Scores   Health/Function Pre 16.4 %   Socioeconomic Pre 27.07 %   Psych/Spiritual Pre 24 %   Family Pre 22.5 %   GLOBAL Pre 21.02 %      PHQ-9: Recent Review Flowsheet Data    Depression screen [facility] 2/9 10/22/2015   Decreased Interest 1   Down, Depressed, Hopeless 1   PHQ - 2 Score 2   Altered sleeping 2   Tired, decreased energy 1   Change in appetite 0   Feeling bad or failure about yourself  1   Trouble concentrating 0   Moving slowly or fidgety/restless 0   Suicidal thoughts 0   PHQ-9 Score 6   Difficult doing work/chores Somewhat difficult      Psychosocial Evaluation and Intervention:     Psychosocial Evaluation - 10/22/15 1527      Psychosocial Evaluation & Interventions   Interventions Stress management education;Relaxation education;Encouraged to exercise with the program and follow exercise prescription   Continued Psychosocial Services Needed Yes  Patient will be monitored through out the program to ensure exercising has improved her feelings of depression.       Psychosocial Re-Evaluation:   Vocational Rehabilitation: Provide vocational rehab assistance to qualifying candidates.   Vocational Rehab Evaluation & Intervention:     Vocational Rehab - 10/22/15 1521      Initial Vocational Rehab Evaluation & Intervention   Assessment shows need for Vocational  Rehabilitation No      Education: Education Goals: Education classes will be provided on a weekly basis, covering required topics. Participant will state understanding/return demonstration of topics presented.  Learning  Barriers/Preferences:     Learning Barriers/Preferences - 10/22/15 1521      Learning Barriers/Preferences   Learning Barriers None   Learning Preferences Audio;Pictoral;Skilled Demonstration;Verbal Instruction;Written Material;Video      Education Topics: Hypertension, Hypertension Reduction -Define heart disease and high blood pressure. Discus how high blood pressure affects the body and ways to reduce high blood pressure.   Exercise and Your Heart -Discuss why it is important to exercise, the FITT principles of exercise, normal and abnormal responses to exercise, and how to exercise safely.   Angina -Discuss definition of angina, causes of angina, treatment of angina, and how to decrease risk of having angina.   Cardiac Medications -Review what the following cardiac medications are used for, how they affect the body, and side effects that may occur when taking the medications.  Medications include Aspirin, Beta blockers, calcium channel blockers, ACE Inhibitors, angiotensin receptor blockers, diuretics, digoxin, and antihyperlipidemics.   Congestive Heart Failure -Discuss the definition of CHF, how to live with CHF, the signs and symptoms of CHF, and how keep track of weight and sodium intake.   Heart Disease and Intimacy -Discus the effect sexual activity has on the heart, how changes occur during intimacy as we age, and safety during sexual activity.   Smoking Cessation / COPD -Discuss different methods to quit smoking, the health benefits of quitting smoking, and the definition of COPD.   Nutrition I: Fats -Discuss the types of cholesterol, what cholesterol does to the heart, and how cholesterol levels can be controlled.   Nutrition II: Labels -Discuss the different components of food labels and how to read food label   Heart Parts and Heart Disease -Discuss the anatomy of the heart, the pathway of blood circulation through the heart, and these are affected by  heart disease.   Stress I: Signs and Symptoms -Discuss the causes of stress, how stress may lead to anxiety and depression, and ways to limit stress.   Stress II: Relaxation -Discuss different types of relaxation techniques to limit stress.   Warning Signs of Stroke / TIA -Discuss definition of a stroke, what the signs and symptoms are of a stroke, and how to identify when someone is having stroke.   Knowledge Questionnaire Score:     Knowledge Questionnaire Score - 10/22/15 1521      Knowledge Questionnaire Score   Pre Score 23/24      Core Components/Risk Factors/Patient Goals at Admission:     Personal Goals and Risk Factors at Admission - 10/22/15 1522      Core Components/Risk Factors/Patient Goals on Admission    Weight Management Weight Maintenance   Increase Strength and Stamina Yes   Intervention Provide advice, education, support and counseling about physical activity/exercise needs.;Develop an individualized exercise prescription for aerobic and resistive training based on initial evaluation findings, risk stratification, comorbidities and participant's personal goals.   Expected Outcomes Achievement of increased cardiorespiratory fitness and enhanced flexibility, muscular endurance and strength shown through measurements of functional capacity and personal statement of participant.   Diabetes Yes   Intervention Provide education about signs/symptoms and action to take for hypo/hyperglycemia.   Expected Outcomes Long Term: Attainment of HbA1C < 7%.   Stress Yes   Intervention Offer individual and/or small group education and counseling on adjustment to heart disease, stress  management and health-related lifestyle change. Teach and support self-help strategies.   Expected Outcomes Short Term: Participant demonstrates changes in health-related behavior, relaxation and other stress management skills, ability to obtain effective social support, and compliance with  psychotropic medications if prescribed.;Long Term: Emotional wellbeing is indicated by absence of clinically significant psychosocial distress or social isolation.   Personal Goal Other Yes   Personal Goal Patient wants to increase her strenght and stamina. She also want to make regular exercise a part of her lifestyle.     Intervention Patient will exercise 3 days/week in program and supplement with 2 days/week at home.    Expected Outcomes Patient will meet her above stated goals.       Core Components/Risk Factors/Patient Goals Review:      Goals and Risk Factor Review    Row Name 10/22/15 1526             Core Components/Risk Factors/Patient Goals Review   Personal Goals Review Increase Strength and Stamina;Weight Management/Obesity;Stress          Core Components/Risk Factors/Patient Goals at Discharge (Final Review):      Goals and Risk Factor Review - 10/22/15 1526      Core Components/Risk Factors/Patient Goals Review   Personal Goals Review Increase Strength and Stamina;Weight Management/Obesity;Stress      ITP Comments:   Comments: Patient arrived for 1st visit/orientation/education at 1230. Patient was referred to CR by Dr. Clifton James due to STEMI (I21.3) and Stent placement(Z95.5). During orientation advised patient on arrival and appointment times what to wear, what to do before, during and after exercise. Reviewed attendance and class policy. Talked about inclement weather and class consultation policy. Pt is scheduled to return Cardiac Rehab on 10/31/15 at 11:00. Pt was advised to come to class 15 minutes before class starts. Patient was also given instructions on meeting with the dietician and attending the Family Structure classes. Pt is eager to get started. Patient was able to complete 6 minute walk test. Patient was measured for the equipment. Discussed equipment safety with patient. Took patient pre-anthropometric measurements. Patient finished visit at 1500.

## 2015-10-23 ENCOUNTER — Encounter: Payer: Self-pay | Admitting: Physician Assistant

## 2015-10-23 ENCOUNTER — Other Ambulatory Visit: Payer: Self-pay | Admitting: Cardiology

## 2015-10-23 DIAGNOSIS — I5022 Chronic systolic (congestive) heart failure: Secondary | ICD-10-CM | POA: Insufficient documentation

## 2015-10-23 DIAGNOSIS — I493 Ventricular premature depolarization: Secondary | ICD-10-CM | POA: Insufficient documentation

## 2015-10-23 DIAGNOSIS — I2510 Atherosclerotic heart disease of native coronary artery without angina pectoris: Secondary | ICD-10-CM | POA: Insufficient documentation

## 2015-10-23 DIAGNOSIS — I255 Ischemic cardiomyopathy: Secondary | ICD-10-CM | POA: Insufficient documentation

## 2015-10-23 DIAGNOSIS — R7303 Prediabetes: Secondary | ICD-10-CM | POA: Insufficient documentation

## 2015-10-23 DIAGNOSIS — I959 Hypotension, unspecified: Secondary | ICD-10-CM | POA: Insufficient documentation

## 2015-10-23 NOTE — Progress Notes (Addendum)
Cardiology Office Note    Date:  10/24/2015  ID:  Catherine Mays, DOB 05-02-1965, MRN [id number] PCP:  Craig Staggers, MD  Cardiologist:  Clifton James   Chief Complaint: f/u CAD/CHF  History of Present Illness:  Catherine Mays is a 50 y.o. female with history of CAD (STEMI 08/2015 s/p PTCA/DES to LAD, mild residual mRCA), ICM/chronic systolic CHF EF 30-35% at time of STEMI, paroxysmal atrial fib (dx at time of STEMI), PVCs, pre-diabetes, hypotension who presents for f/u.  Her cardiac issues are all fairly recent as of summer 2017. At time of STEMI, she underwent PCI as above with relook cath 7/28 demonstrating patency of the stent. 2D echo 09/20/15: EF 30-35%, akinesis of apical anteroseptal and anterior myocardium, grade 1 DD, no evidence of thrombus, PASP 30. She was discharged with LifeVest for LV dysfunction, amiodarone for her atrial fib, and triple blood thinner therapy for both her MI and afib. The plan was to discontinue aspirin after 1 month of therapy. Phone note 09/25/15 indicates coreg cut down due to fatigue (but the patient reports she did not do this because she later felt it wasn't that bad). At OV 10/10/15 her BP and HR were too low to further titrate HF medications - spironolactone was cut in half due to BP 90/60. Last labs 10/10/15 showed acute kidney injury with BUN/Cr 31/1.65, K 4.3 (this was before spiro was stopped). Other pertinent labs include abnormal TSH 09/24/15 - 6.184. She has f/u with her PCP to further monitor this.  She presents back to clinic today overall doing well. Motivated to start cardiac rehab - went to orientation and enjoyed it. No chest pain. No SOB. + intermittent dizziness but she does not wish to cut carvedilol back at this time. No syncope or pre-syncope. No Lifevest discharges. Spent a lot of time answering many good questions about HF, CAD.  Past Medical History:  Diagnosis Date  . CAD in native artery   . Chronic systolic CHF (congestive heart failure)  (HCC)   . Hypotension    a. preventing med titration for HF.  Marland Kitchen Hypothyroidism 09/24/2015  . Ischemic cardiomyopathy   . PAF (paroxysmal atrial fibrillation) (HCC) 09/24/2015   a. dx at time of STEMI 08/2015.  . Pre-diabetes   . PVC's (premature ventricular contractions)     Past Surgical History:  Procedure Laterality Date  . CARDIAC CATHETERIZATION N/A 09/20/2015   Procedure: Left Heart Cath and Coronary Angiography;  Surgeon: Kathleene Hazel, MD;  Location: [facility] INVASIVE CV LAB;  Service: Cardiovascular;  Laterality: N/A;  . CARDIAC CATHETERIZATION N/A 09/20/2015   Procedure: Coronary Stent Intervention;  Surgeon: Kathleene Hazel, MD;  Location: [facility] INVASIVE CV LAB;  Service: Cardiovascular;  Laterality: N/A;  . CARDIAC CATHETERIZATION N/A 09/21/2015   Procedure: Left Heart Cath and Coronary Angiography;  Surgeon: Tonny Bollman, MD;  Location: [facility] INVASIVE CV LAB;  Service: Cardiovascular;  Laterality: N/A;  . CORONARY STENT PLACEMENT  09/20/2015  . THYROIDECTOMY      Current Medications:  SHE IS ACTUALLY ON COREG 6.125mg  BID not 1/2 tablet BID  Current Outpatient Prescriptions  Medication Sig Dispense Refill  . amiodarone (PACERONE) 200 MG tablet Take 1 tablet (200 mg total) by mouth 2 (two) times daily. (Patient taking differently: Take 200 mg by mouth at bedtime. ) 60 tablet 11  . aspirin 81 MG chewable tablet Chew 1 tablet (81 mg total) by mouth daily.    Marland Kitchen atorvastatin (LIPITOR) 80 MG tablet Take 1 tablet (80 mg total)  by mouth daily at 6 PM. 30 tablet 11  . Calcium Carb-Cholecalciferol (CALCIUM + D3 PO) Take 1 tablet by mouth every morning.    . carvedilol (COREG) 6.25 MG tablet Take 0.5 tablets (3.125 mg total) by mouth 2 (two) times daily with a meal. 60 tablet 11  . furosemide (LASIX) 20 MG tablet Take 1 tablet (20 mg total) by mouth as directed. 30 tablet 4  . lisinopril (PRINIVIL,ZESTRIL) 2.5 MG tablet Take 1 tablet (2.5 mg total) by mouth daily. 30 tablet 11    . nitroGLYCERIN (NITROSTAT) 0.4 MG SL tablet Place 1 tablet (0.4 mg total) under the tongue every 5 (five) minutes as needed for chest pain. 25 tablet 3  . pantoprazole (PROTONIX) 40 MG tablet Take 1 tablet (40 mg total) by mouth daily. 30 tablet 11  . rivaroxaban (XARELTO) 20 MG TABS tablet Take 1 tablet (20 mg total) by mouth daily with supper. 30 tablet 0  . SYNTHROID 125 MCG tablet Take 125 mcg by mouth every morning.  5  . ticagrelor (BRILINTA) 90 MG TABS tablet Take 1 tablet (90 mg total) by mouth 2 (two) times daily. 180 tablet 3  . triamcinolone (NASACORT ALLERGY 24HR) 55 MCG/ACT AERO nasal inhaler Place 2 sprays into the nose daily.      Allergies:   Review of patient's allergies indicates no known allergies.   Social History   Social History  . Marital status: Married    Spouse name: N/A  . Number of children: N/A  . Years of education: N/A   Social History Main Topics  . Smoking status: Never Smoker  . Smokeless tobacco: Never Used  . Alcohol use No  . Drug use: No  . Sexual activity: Not Asked   Other Topics Concern  . None   Social History Narrative  . None     Family History:  The patient's family history includes Arrhythmia in her mother; Heart attack in her father.  ROS:   Please see the history of present illness. All other systems are reviewed and otherwise negative.    PHYSICAL EXAM:   VS:  BP 92/70   Pulse 64   Ht 5\' 9"  (1.753 m)   Wt 156 lb (70.8 kg)   BMI 23.04 kg/m   BMI: Body mass index is 23.04 kg/m. GEN: Well nourished, well developed WF, in no acute distress  HEENT: normocephalic, atraumatic Neck: no JVD, carotid bruits, or masses Cardiac: RRR; no murmurs, rubs, or gallops, no edema  Respiratory:  clear to auscultation bilaterally, normal work of breathing GI: soft, nontender, nondistended, + BS MS: no deformity or atrophy  Skin: warm and dry, no rash Neuro:  Alert and Oriented x 3, Strength and sensation are intact, follows  commands Psych: euthymic mood, full affect  Wt Readings from Last 3 Encounters:  10/24/15 156 lb (70.8 kg)  10/22/15 153 lb 3.5 oz (69.5 kg)  10/10/15 152 lb (68.9 kg)      Studies/Labs Reviewed:   EKG:  EKG was ordered today and personally reviewed by me and demonstrates NSR 65bpm, LAFB, prior anteroseptal infarct, TWI avL, V2, no acute ST-T changes, QTc calculated by me - Recent Labs: 09/24/2015: ALT 31; TSH 6.184 09/25/2015: Hemoglobin 12.2; Platelets 270 10/10/2015: BUN 31; Creat 1.65; Potassium 4.3; Sodium 136   Lipid Panel No results found for: CHOL, TRIG, HDL, CHOLHDL, VLDL, LDLCALC, LDLDIRECT  Additional studies/ records that were reviewed today include: Summarized above.    ASSESSMENT & PLAN:  1. CAD  - no recurrent angina. Reminded patient that last day of ASA is 10/26/15. Continue Brilinta. Continue BB at present dose as patient does not wish to change at this time (6.25mg  BID). Warning symptoms reviewed - if she continues to have intermittent dizziness I would advise she cut it back to 1/2 tablet BID as previously discussed.  2. 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 Continue current regimen. No spiro due to hypotension. See above re: BB. Will schedule echo for 91 days after date of discharge as I believe we've titrated meds as much as she will be able to tolerate from a BP standpoint. If LVEF remains down will need referral to EP. Reviewed daily wts, low sodium diet, 2L fluid restriction. Repeat BMET pending given recent acute kidney injury per Robbie LisBrittainy Simmons' prior orders. Can consider CMET at next OV to ensure stability of LFTs on statin. 3. Paroxysmal atrial fib - question r/t ischemia. Per d/w Dr. Clifton JamesMcAlhany, continue amiodarone short term at 200mg  once daily - would consider  discontinuing at f/u visit once we re-assess LVF. May need event monitoring thereafter to determine if any recurrences. Continue Xarelto for now. 4. Hypothyroidism - has f/u PCP.  Disposition: F/u with Dr. Clifton JamesMcAlhany or myself after f/u echocardiogram   Medication Adjustments/Labs and Tests Ordered: Current medicines are reviewed at length with the patient today.  Concerns regarding medicines are outlined above. Medication changes, Labs and Tests ordered today are summarized above and listed in the Patient Instructions accessible in Encounters.   Thomasene MohairSigned, Dayna Dunn PA-C  10/24/2015 8:52 AM    [facility] [address], [place], [place]  [address] Phone: [phone]; Fax: [phone]

## 2015-10-24 ENCOUNTER — Other Ambulatory Visit: Payer: BLUE CROSS/BLUE SHIELD | Admitting: *Deleted

## 2015-10-24 ENCOUNTER — Encounter (INDEPENDENT_AMBULATORY_CARE_PROVIDER_SITE_OTHER): Payer: Self-pay

## 2015-10-24 ENCOUNTER — Encounter: Payer: Self-pay | Admitting: Physician Assistant

## 2015-10-24 ENCOUNTER — Ambulatory Visit (INDEPENDENT_AMBULATORY_CARE_PROVIDER_SITE_OTHER): Payer: BLUE CROSS/BLUE SHIELD | Admitting: Physician Assistant

## 2015-10-24 VITALS — BP 92/70 | HR 64 | Ht 69.0 in | Wt 156.0 lb

## 2015-10-24 DIAGNOSIS — I5022 Chronic systolic (congestive) heart failure: Secondary | ICD-10-CM | POA: Diagnosis not present

## 2015-10-24 DIAGNOSIS — I255 Ischemic cardiomyopathy: Secondary | ICD-10-CM

## 2015-10-24 DIAGNOSIS — N926 Irregular menstruation, unspecified: Secondary | ICD-10-CM

## 2015-10-24 DIAGNOSIS — I480 Paroxysmal atrial fibrillation: Secondary | ICD-10-CM

## 2015-10-24 DIAGNOSIS — I959 Hypotension, unspecified: Secondary | ICD-10-CM | POA: Diagnosis not present

## 2015-10-24 DIAGNOSIS — I2510 Atherosclerotic heart disease of native coronary artery without angina pectoris: Secondary | ICD-10-CM | POA: Diagnosis not present

## 2015-10-24 DIAGNOSIS — E785 Hyperlipidemia, unspecified: Secondary | ICD-10-CM

## 2015-10-24 DIAGNOSIS — E039 Hypothyroidism, unspecified: Secondary | ICD-10-CM

## 2015-10-24 LAB — LIPID PANEL
Cholesterol: 93 mg/dL — ABNORMAL LOW (ref 125–200)
HDL: 43 mg/dL — ABNORMAL LOW (ref >=46)
LDL Cholesterol: 33 mg/dL (ref <130)
Total CHOL/HDL Ratio: 2.2 Ratio (ref <=5.0)
Triglycerides: 86 mg/dL (ref <150)
VLDL: 17 mg/dL (ref <30)

## 2015-10-24 LAB — BASIC METABOLIC PANEL
BUN: 24 mg/dL (ref 7–25)
CO2: 24 mmol/L (ref 20–31)
Calcium: 8.4 mg/dL — ABNORMAL LOW (ref 8.6–10.2)
Chloride: 99 mmol/L (ref 98–110)
Creat: 1.29 mg/dL — ABNORMAL HIGH (ref 0.50–1.10)
Glucose, Bld: 122 mg/dL — ABNORMAL HIGH (ref 65–99)
Potassium: 3.9 mmol/L (ref 3.5–5.3)
Sodium: 139 mmol/L (ref 135–146)

## 2015-10-24 MED ORDER — CARVEDILOL 6.25 MG PO TABS: 6.2500 mg | ORAL_TABLET | Freq: Two times a day (BID) | ORAL | 11 refills | Status: DC

## 2015-10-24 MED FILL — BRILINTA 90 MG TABLET: 90 | 30 days supply | Qty: 60 | Fill #0 | Status: TO

## 2015-10-24 NOTE — Patient Instructions (Signed)
Medication Instructions:  Take your last dose of Aspirin on September 1. All other medications remain the same.  Labwork: None  Testing/Procedures: Your physician has requested that you have an echocardiogram. Echocardiography is a painless test that uses sound waves to create images of your heart. It provides your doctor with information about the size and shape of your heart and how well your heart's chambers and valves are working. This procedure takes approximately one hour. There are no restrictions for this procedure. Please do not schedule until 91 days after 09/26/15 per Ronie Spies, PA-c   Follow-Up: With Dr Clifton James or Ronie Spies, PA-c after Echo.  Any Other Special Instructions Will Be Listed Below (If Applicable). No driving EF is reassessed. Hold off on teeth cleaning for now.    If you need a refill on your cardiac medications before your next appointment, please call your pharmacy.

## 2015-10-30 ENCOUNTER — Telehealth: Payer: Self-pay | Admitting: Physician Assistant

## 2015-10-30 NOTE — Telephone Encounter (Signed)
Returned pts call and she has been made aware of her lab results. 

## 2015-10-30 NOTE — Telephone Encounter (Signed)
-----   Message from Allayne Butcher, [place] sent at 10/26/2015  3:30 PM EDT ----- Kidney function improved. Continue current meds as directed after recent office visit.

## 2015-10-30 NOTE — Telephone Encounter (Signed)
New message ° ° ° ° °Returning a call to the nurse to get lab results °

## 2015-10-31 ENCOUNTER — Encounter (HOSPITAL_COMMUNITY): Payer: BLUE CROSS/BLUE SHIELD

## 2015-10-31 ENCOUNTER — Ambulatory Visit (HOSPITAL_COMMUNITY): Payer: BLUE CROSS/BLUE SHIELD

## 2015-10-31 ENCOUNTER — Encounter (HOSPITAL_COMMUNITY): Payer: Self-pay

## 2015-10-31 ENCOUNTER — Encounter (HOSPITAL_COMMUNITY)
Admission: RE | Admit: 2015-10-31 | Discharge: 2015-10-31 | Disposition: A | Discharge status: Home / Self Care | Payer: BLUE CROSS/BLUE SHIELD | Source: Ambulatory Visit | Attending: Cardiovascular Disease | Admitting: Cardiovascular Disease

## 2015-10-31 DIAGNOSIS — Z955 Presence of coronary angioplasty implant and graft: Secondary | ICD-10-CM | POA: Insufficient documentation

## 2015-10-31 DIAGNOSIS — I213 ST elevation (STEMI) myocardial infarction of unspecified site: Secondary | ICD-10-CM | POA: Diagnosis present

## 2015-10-31 NOTE — Progress Notes (Signed)
Daily Session Note  Patient Details  Name: Catherine Mays MRN: [id number] Date of Birth: 14-Jan-1966 Referring Provider:    Encounter Date: 10/31/2015  Check In:     Session Check In - 10/31/15 1540      Check-In   Location [facility]   Staff Present Aundra Dubin, RN, BSN;Demontrae Gilbert Luther Parody, BS, EP, Exercise Physiologist   Supervising physician immediately available to respond to emergencies See telemetry face sheet for immediately available MD   Medication changes reported     No   Fall or balance concerns reported    No   Warm-up and Cool-down Performed as group-led instruction   Resistance Training Performed Yes   VAD Patient? No     Pain Assessment   Currently in Pain? No/denies   Pain Score 0-No pain   Multiple Pain Sites No      Capillary Blood Glucose: No results found for this or any previous visit (from the past 24 hour(s)).   Goals Met:  Independence with exercise equipment Exercise tolerated well No report of cardiac concerns or symptoms Strength training completed today  Goals Unmet:  Not Applicable  Comments: Check out 445   Dr. Kate Sable is Medical Director for [place] and Pulmonary Rehab.

## 2015-11-02 ENCOUNTER — Ambulatory Visit (HOSPITAL_COMMUNITY): Payer: BLUE CROSS/BLUE SHIELD

## 2015-11-02 ENCOUNTER — Encounter (HOSPITAL_COMMUNITY): Payer: BLUE CROSS/BLUE SHIELD

## 2015-11-02 ENCOUNTER — Encounter (HOSPITAL_COMMUNITY)
Admission: RE | Admit: 2015-11-02 | Discharge: 2015-11-02 | Disposition: A | Discharge status: Home / Self Care | Payer: BLUE CROSS/BLUE SHIELD | Source: Ambulatory Visit | Attending: Cardiovascular Disease | Admitting: Cardiovascular Disease

## 2015-11-02 ENCOUNTER — Telehealth: Payer: Self-pay | Admitting: Cardiovascular Disease

## 2015-11-02 ENCOUNTER — Encounter (HOSPITAL_COMMUNITY): Payer: Self-pay

## 2015-11-02 DIAGNOSIS — I213 ST elevation (STEMI) myocardial infarction of unspecified site: Secondary | ICD-10-CM | POA: Diagnosis not present

## 2015-11-02 NOTE — Telephone Encounter (Signed)
New message       Calling to get updated information on pt based on august ov. Please call

## 2015-11-02 NOTE — Progress Notes (Signed)
Daily Session Note  Patient Details  Name: Catherine Mays MRN: [id number] Date of Birth: 06-22-1965 Referring Provider:    Encounter Date: 11/02/2015  Check In:     Session Check In - 11/02/15 1540      Check-In   Location [facility]   Staff Present Aundra Dubin, RN, BSN;Anneta Rounds Luther Parody, BS, EP, Exercise Physiologist   Supervising physician immediately available to respond to emergencies See telemetry face sheet for immediately available MD   Medication changes reported     No   Fall or balance concerns reported    No   Warm-up and Cool-down Performed as group-led instruction   Resistance Training Performed Yes   VAD Patient? No     Pain Assessment   Currently in Pain? No/denies   Pain Score 0-No pain   Multiple Pain Sites No      Capillary Blood Glucose: No results found for this or any previous visit (from the past 24 hour(s)).   Goals Met:  Independence with exercise equipment Exercise tolerated well No report of cardiac concerns or symptoms Strength training completed today  Goals Unmet:  Not Applicable  Comments: Check out 445   Dr. Kate Sable is Medical Director for [place] and Pulmonary Rehab.

## 2015-11-02 NOTE — Telephone Encounter (Signed)
Will forward to medical records 

## 2015-11-05 ENCOUNTER — Encounter (HOSPITAL_COMMUNITY)
Admission: RE | Admit: 2015-11-05 | Discharge: 2015-11-05 | Disposition: A | Discharge status: Home / Self Care | Payer: BLUE CROSS/BLUE SHIELD | Source: Ambulatory Visit | Attending: Cardiovascular Disease | Admitting: Cardiovascular Disease

## 2015-11-05 ENCOUNTER — Encounter (HOSPITAL_COMMUNITY): Payer: BLUE CROSS/BLUE SHIELD

## 2015-11-05 DIAGNOSIS — I213 ST elevation (STEMI) myocardial infarction of unspecified site: Secondary | ICD-10-CM | POA: Diagnosis not present

## 2015-11-05 NOTE — Progress Notes (Signed)
Daily Session Note  Patient Details  Name: Nargis Abrams MRN: [id number] Date of Birth: 1965/10/04 Referring Provider:    Encounter Date: 11/05/2015  Check In:     Session Check In - 11/05/15 1545      Check-In   Location [facility]   Staff Present Diane Angelina Pih, MS, EP, [facility], Exercise Physiologist;Elianis Fischbach Wynetta Emery, RN, BSN   Supervising physician immediately available to respond to emergencies See telemetry face sheet for immediately available MD   Medication changes reported     No   Fall or balance concerns reported    No   Warm-up and Cool-down Performed as group-led instruction   Resistance Training Performed Yes   VAD Patient? No     Pain Assessment   Currently in Pain? No/denies   Pain Score 0-No pain   Multiple Pain Sites No      Capillary Blood Glucose: No results found for this or any previous visit (from the past 24 hour(s)).   Goals Met:  Independence with exercise equipment Exercise tolerated well No report of cardiac concerns or symptoms Strength training completed today  Goals Unmet:  Not Applicable  Comments: Check out 1545.   Dr. Kate Sable is Medical Director for [facility] and [facility] Re[facility].

## 2015-11-07 ENCOUNTER — Encounter (HOSPITAL_COMMUNITY): Payer: BLUE CROSS/BLUE SHIELD

## 2015-11-07 ENCOUNTER — Encounter (HOSPITAL_COMMUNITY)
Admission: RE | Admit: 2015-11-07 | Discharge: 2015-11-07 | Disposition: A | Discharge status: Home / Self Care | Payer: BLUE CROSS/BLUE SHIELD | Source: Ambulatory Visit | Attending: Cardiovascular Disease | Admitting: Cardiovascular Disease

## 2015-11-07 DIAGNOSIS — I213 ST elevation (STEMI) myocardial infarction of unspecified site: Secondary | ICD-10-CM | POA: Diagnosis not present

## 2015-11-07 NOTE — Progress Notes (Signed)
Daily Session Note  Patient Details  Name: Catherine Mays MRN: [id number] Date of Birth: 09/13/1965 Referring Provider:    Encounter Date: 11/07/2015  Check In:     Session Check In - 11/07/15 1545      Check-In   Location [facility]   Staff Present Diane Coad, MS, EP, [facility], Exercise Physiologist;Debra Johnson, RN, BSN   Supervising physician immediately available to respond to emergencies See telemetry face sheet for immediately available MD   Medication changes reported     No   Fall or balance concerns reported    No   Warm-up and Cool-down Performed as group-led instruction   Resistance Training Performed Yes   VAD Patient? No     Pain Assessment   Currently in Pain? No/denies   Pain Score 0-No pain   Multiple Pain Sites No      Capillary Blood Glucose: No results found for this or any previous visit (from the past 24 hour(s)).   Goals Met:  Independence with exercise equipment Exercise tolerated well No report of cardiac concerns or symptoms Strength training completed today  Goals Unmet:  Not Applicable  Comments: Check out 1645.   Dr. Suresh Koneswaran is Medical Director for [place] [facility] and [facility]. 

## 2015-11-09 ENCOUNTER — Encounter (HOSPITAL_COMMUNITY)
Admission: RE | Admit: 2015-11-09 | Discharge: 2015-11-09 | Disposition: A | Discharge status: Home / Self Care | Payer: BLUE CROSS/BLUE SHIELD | Source: Ambulatory Visit | Attending: Cardiovascular Disease | Admitting: Cardiovascular Disease

## 2015-11-09 ENCOUNTER — Encounter (HOSPITAL_COMMUNITY): Payer: BLUE CROSS/BLUE SHIELD

## 2015-11-09 DIAGNOSIS — I213 ST elevation (STEMI) myocardial infarction of unspecified site: Secondary | ICD-10-CM | POA: Diagnosis not present

## 2015-11-09 NOTE — Progress Notes (Signed)
Cardiac Individual Treatment Plan  Patient Details  Name: Catherine Mays MRN: [id number] Date of Birth: 07-Dec-1965 Referring Provider:    Initial Encounter Date:  [place] PHASE II ORIENTATION from 10/22/2015 in [place]  Date  10/22/15      Visit Diagnosis: ST elevation myocardial infarction (STEMI), unspecified artery ([place])  Patient's Home Medications on Admission:  Current Outpatient Prescriptions:  .  amiodarone (PACERONE) 200 MG tablet, Take 1 tablet (200 mg total) by mouth at bedtime., Disp: , Rfl:  .  atorvastatin (LIPITOR) 80 MG tablet, Take 1 tablet (80 mg total) by mouth daily at 6 PM., Disp: 30 tablet, Rfl: 11 .  Calcium Carb-Cholecalciferol (CALCIUM + D3 PO), Take 1 tablet by mouth every morning., Disp: , Rfl:  .  carvedilol (COREG) 6.25 MG tablet, Take 1 tablet (6.25 mg total) by mouth 2 (two) times daily with a meal. Increase in dose on next refill, Disp: 60 tablet, Rfl: 11 .  furosemide (LASIX) 20 MG tablet, Take 1 tablet (20 mg total) by mouth as directed., Disp: 30 tablet, Rfl: 4 .  lisinopril (PRINIVIL,ZESTRIL) 2.5 MG tablet, Take 1 tablet (2.5 mg total) by mouth daily., Disp: 30 tablet, Rfl: 11 .  nitroGLYCERIN (NITROSTAT) 0.4 MG SL tablet, Place 1 tablet (0.4 mg total) under the tongue every 5 (five) minutes as needed for chest pain., Disp: 25 tablet, Rfl: 3 .  pantoprazole (PROTONIX) 40 MG tablet, Take 1 tablet (40 mg total) by mouth daily., Disp: 30 tablet, Rfl: 11 .  rivaroxaban (XARELTO) 20 MG TABS tablet, Take 1 tablet (20 mg total) by mouth daily with supper., Disp: 30 tablet, Rfl: 0 .  SYNTHROID 125 MCG tablet, Take 125 mcg by mouth every morning., Disp: , Rfl: 5 .  ticagrelor (BRILINTA) 90 MG TABS tablet, Take 1 tablet (90 mg total) by mouth 2 (two) times daily., Disp: 180 tablet, Rfl: 3 .  triamcinolone (NASACORT ALLERGY 24HR) 55 MCG/ACT AERO nasal inhaler, Place 2 sprays into the nose daily., Disp: , Rfl:  No  current facility-administered medications for this encounter.   Facility-Administered Medications Ordered in Other Encounters:  .  bivalirudin (ANGIOMAX) 250 mg in sodium chloride 0.9 % 50 mL (5 mg/mL) infusion, , , Continuous PRN, Burnell Blanks, MD, Stopped at 09/20/15 [phone]  bivalirudin (ANGIOMAX) BOLUS via infusion, , , PRN, Burnell Blanks, MD, 54.45 mg at 09/20/15 5456 .  fentaNYL (SUBLIMAZE) injection, , , PRN, Burnell Blanks, MD, 25 mcg at 09/20/15 0651 .  heparin 1,500 mL, , , PRN, Burnell Blanks, MD .  heparin infusion 2 units/mL in 0.9 % sodium chloride, , , Continuous PRN, Burnell Blanks, MD, 1,500 mL at 09/20/15 0733 .  iopamidol (ISOVUE-370) 76 % injection, , , PRN, Burnell Blanks, MD, 165 mL at 09/20/15 0733 .  lidocaine (PF) (XYLOCAINE) 1 % injection, , , PRN, Burnell Blanks, MD, 2 mL at 09/20/15 2563 .  midazolam (VERSED) injection, , , PRN, Burnell Blanks, MD, 1 mg at 09/20/15 0651 .  nitroGLYCERIN 1 mg/10 ml (100 mcg/ml) - IR/CATH LAB, , , PRN, Burnell Blanks, MD, 200 mcg at 09/20/15 8937 .  Radial Cocktail/Verapamil only, , , PRN, Burnell Blanks, MD, 10 mL at 09/20/15 0629 .  ticagrelor (BRILINTA) tablet, , , PRN, Burnell Blanks, MD, 180 mg at 09/20/15 [phone] .  tirofiban (AGGRASTAT) bolus via infusion, , , PRN, Burnell Blanks, MD, 1,815 mcg at 09/20/15 [phone] .  tirofiban (AGGRASTAT) infusion 50 mcg/mL 100 mL, , , Continuous PRN, Burnell Blanks, MD, Last Rate: 13.1 mL/hr at 09/20/15 0651, 0.15 mcg/kg/min at 09/20/15 7356  Past Medical History: Past Medical History:  Diagnosis Date  . CAD in native artery   . Chronic systolic CHF (congestive heart failure) ([place])   . Hypotension    a. preventing med titration for HF.  Marland Kitchen Hypothyroidism 09/24/2015  . Ischemic cardiomyopathy   . PAF (paroxysmal atrial fibrillation) ([place]) 09/24/2015   a. dx at time of STEMI 08/2015.  .  Pre-diabetes   . PVC's (premature ventricular contractions)     Tobacco Use: History  Smoking Status  . Never Smoker  Smokeless Tobacco  . Never Used    Labs: Recent Review Flowsheet Data    Labs for ITP Cardiac and Pulmonary Rehab Latest Ref Rng & Units 10/10/2015 10/24/2015   Cholestrol 125 - 200 mg/dL - 93(L)   LDLCALC <130 mg/dL - 33   HDL >=46 mg/dL - 43(L)   Trlycerides <150 mg/dL - 86   Hemoglobin A1c <5.7 % 6.2(H) -      Capillary Blood Glucose: No results found for: GLUCAP   Exercise Target Goals:    Exercise Program Goal: Individual exercise prescription set with THRR, safety & activity barriers. Participant demonstrates ability to understand and report RPE using BORG scale, to self-measure pulse accurately, and to acknowledge the importance of the exercise prescription.  Exercise Prescription Goal: Starting with aerobic activity 30 plus minutes a day, 3 days per week for initial exercise prescription. Provide home exercise prescription and guidelines that participant acknowledges understanding prior to discharge.  Activity Barriers & Risk Stratification:     Activity Barriers & Cardiac Risk Stratification - 10/22/15 1520      Activity Barriers & Cardiac Risk Stratification   Activity Barriers None   Cardiac Risk Stratification High      6 Minute Walk:     6 Minute Walk    Row Name 10/22/15 1422         6 Minute Walk   Phase Initial     Distance 1500 feet     Walk Time 6 minutes     # of Rest Breaks 0     MPH 2.84     METS 3.17     RPE 8     Perceived Dyspnea  7     VO2 Peak 15.73     Symptoms No     Resting HR 74 bpm     Resting BP 90/60     Max Ex. HR 94 bpm     Max Ex. BP 106/70     2 Minute Post BP 96/66        Initial Exercise Prescription:     Initial Exercise Prescription - 10/22/15 1400      Date of Initial Exercise RX and Referring Provider   Date 10/22/15     NuStep   Level 2   Watts 15   Minutes 15   METs 1.9      Elliptical   Level 1   Speed 1.5   Minutes 20   METs 1.9     Prescription Details   Frequency (times per week) 3   Duration Progress to 30 minutes of continuous aerobic without signs/symptoms of physical distress     Intensity   THRR REST +  30   THRR 40-80% of Max Heartrate [phone]   Ratings of Perceived Exertion 11-13   Perceived Dyspnea  0-4     Progression   Progression Continue progressive overload as per policy without signs/symptoms or physical distress.     Resistance Training   Training Prescription Yes   Weight 1   Reps 10-12      Perform Capillary Blood Glucose checks as needed.  Exercise Prescription Changes:      Exercise Prescription Changes    Row Name 11/06/15 1500             Exercise Review   Progression Yes         Response to Exercise   Blood Pressure (Admit) 96/40       Blood Pressure (Exercise) 130/70       Blood Pressure (Exit) 108/62       Heart Rate (Admit) 81 bpm       Heart Rate (Exercise) 93 bpm       Heart Rate (Exit) 90 bpm       Rating of Perceived Exertion (Exercise) 8       Duration Progress to 30 minutes of continuous aerobic without signs/symptoms of physical distress       Intensity Rest + 30         Progression   Progression Continue progressive overload as per policy without signs/symptoms or physical distress.         Resistance Training   Training Prescription Yes       Weight 2       Reps 10-12         Treadmill   MPH 2.2       Grade 0       Minutes 15       METs 2.68         NuStep   Level 2       Watts 28       Minutes 15       METs 3.45         Elliptical   Level 1       Speed 1.5       Minutes 20       METs 1.9         Home Exercise Plan   Plans to continue exercise at Home       Frequency Add 2 additional days to program exercise sessions.          Exercise Comments:      Exercise Comments    Row Name 11/06/15 1543           Exercise Comments Patient is proggressing  appropriately, was switched from standing elliptical to treadmill due to tightness in chest area            Discharge Exercise Prescription (Final Exercise Prescription Changes):     Exercise Prescription Changes - 11/06/15 1500      Exercise Review   Progression Yes     Response to Exercise   Blood Pressure (Admit) 96/40   Blood Pressure (Exercise) 130/70   Blood Pressure (Exit) 108/62   Heart Rate (Admit) 81 bpm   Heart Rate (Exercise) 93 bpm   Heart Rate (Exit) 90 bpm   Rating of Perceived Exertion (Exercise) 8   Duration Progress to 30 minutes of continuous aerobic without signs/symptoms of physical distress   Intensity Rest + 30     Progression   Progression Continue progressive overload as per policy without signs/symptoms or physical distress.     Resistance Training   Training Prescription Yes   Weight  2   Reps 10-12     Treadmill   MPH 2.2   Grade 0   Minutes 15   METs 2.68     NuStep   Level 2   Watts 28   Minutes 15   METs 3.45     Elliptical   Level 1   Speed 1.5   Minutes 20   METs 1.9     Home Exercise Plan   Plans to continue exercise at Home   Frequency Add 2 additional days to program exercise sessions.      Nutrition:  Target Goals: Understanding of nutrition guidelines, daily intake of sodium <1557m, cholesterol <2044m calories 30% from fat and 7% or less from saturated fats, daily to have 5 or more servings of fruits and vegetables.  Biometrics:     Pre Biometrics - 10/22/15 1425      Pre Biometrics   Height _0  (1.753 m)   Weight 153 lb 3.5 oz (69.5 kg)   Waist Circumference 33 inches   Hip Circumference 37.5 inches   Waist to Hip Ratio 0.88 %   BMI (Calculated) 22.7   Triceps Skinfold 12 mm   % Body Fat 20.8 %   Grip Strength 70 kg   Flexibility 15 in   Single Leg Stand 60 seconds       Nutrition Therapy Plan and Nutrition Goals:   Nutrition Discharge: Rate Your Plate Scores:     Nutrition Assessments -  10/22/15 1522      MEDFICTS Scores   Pre Score 41      Nutrition Goals Re-Evaluation:   Psychosocial: Target Goals: Acknowledge presence or absence of depression, maximize coping skills, provide positive support system. Participant is able to verbalize types and ability to use techniques and skills needed for reducing stress and depression.  Initial Review & Psychosocial Screening:     Initial Psych Review & Screening - 10/22/15 1526      Initial Review   Current issues with Current Stress Concerns;Current Sleep Concerns;Current Depression   Source of Stress Concerns Occupation     [place]     Barriers   Psychosocial barriers to participate in program There are no identifiable barriers or psychosocial needs.     Screening Interventions   Interventions Encouraged to exercise      Quality of Life Scores:     Quality of Life - 10/22/15 1426      Quality of Life Scores   Health/Function Pre 16.4 %   Socioeconomic Pre 27.07 %   Psych/Spiritual Pre 24 %   Family Pre 22.5 %   GLOBAL Pre 21.02 %      PHQ-9: Recent Review Flowsheet Data    [facility]   Decreased Interest 1   Down, Depressed, Hopeless 1   PHQ - 2 Score 2   Altered sleeping 2   Tired, decreased energy 1   Change in appetite 0   Feeling bad or failure about yourself  1   Trouble concentrating 0   Moving slowly or fidgety/restless 0   Suicidal thoughts 0   PHQ-9 Score 6   Difficult doing work/chores Somewhat difficult      Psychosocial Evaluation and Intervention:     Psychosocial Evaluation - 10/22/15 1527      Psychosocial Evaluation & Interventions   Interventions Stress management education;Relaxation education;Encouraged to exercise with the program and follow exercise prescription   Continued Psychosocial Services Needed  Yes  Patient will be monitored through out the program to ensure exercising has improved her feelings of  depression.       Psychosocial Re-Evaluation:     Psychosocial Re-Evaluation    [place] Name 11/09/15 1439             Psychosocial Re-Evaluation   Interventions Encouraged to attend Cardiac Rehabilitation for the exercise       Comments Patient's QOL score was 21.02 and her PHQ-9 score was 6. Patient has had some depression since her cardiac event but she feels some improvement coming to program. She feels she does not need counseling.            Vocational Rehabilitation: Provide vocational rehab assistance to qualifying candidates.   Vocational Rehab Evaluation & Intervention:     Vocational Rehab - 10/22/15 1521      Initial Vocational Rehab Evaluation & Intervention   Assessment shows need for Vocational Rehabilitation No      Education: Education Goals: Education classes will be provided on a weekly basis, covering required topics. Participant will state understanding/return demonstration of topics presented.  Learning Barriers/Preferences:     Learning Barriers/Preferences - 10/22/15 1521      Learning Barriers/Preferences   Learning Barriers None   Learning Preferences Audio;Pictoral;Skilled Demonstration;Verbal Instruction;Written Material;Video      Education Topics: Hypertension, Hypertension Reduction -Define heart disease and high blood pressure. Discus how high blood pressure affects the body and ways to reduce high blood pressure.   Exercise and Your Heart -Discuss why it is important to exercise, the FITT principles of exercise, normal and abnormal responses to exercise, and how to exercise safely.   Angina -Discuss definition of angina, causes of angina, treatment of angina, and how to decrease risk of having angina.   Cardiac Medications -Review what the following cardiac medications are used for, how they affect the body, and side effects that may occur when taking the medications.  Medications include Aspirin, Beta blockers, calcium channel  blockers, ACE Inhibitors, angiotensin receptor blockers, diuretics, digoxin, and antihyperlipidemics.   Congestive Heart Failure -Discuss the definition of CHF, how to live with CHF, the signs and symptoms of CHF, and how keep track of weight and sodium intake.   Heart Disease and Intimacy -Discus the effect sexual activity has on the heart, how changes occur during intimacy as we age, and safety during sexual activity. Flowsheet Row CARDIAC REHAB PHASE II EXERCISE from 11/07/2015 in [facility]  Date  10/31/15  Educator  DJ  Instruction Review Code  2- meets goals/outcomes      Smoking Cessation / COPD -Discuss different methods to quit smoking, the health benefits of quitting smoking, and the definition of COPD. Flowsheet Row CARDIAC REHAB PHASE II EXERCISE from 11/07/2015 in [place]  Date  11/07/15  Educator  DJ  Instruction Review Code  2- meets goals/outcomes      Nutrition I: Fats -Discuss the types of cholesterol, what cholesterol does to the heart, and how cholesterol levels can be controlled.   Nutrition II: Labels -Discuss the different components of food labels and how to read food label   Heart Parts and Heart Disease -Discuss the anatomy of the heart, the pathway of blood circulation through the heart, and these are affected by heart disease.   Stress I: Signs and Symptoms -Discuss the causes of stress, how stress may lead to anxiety and depression, and ways to limit stress.   Stress II: Relaxation -Discuss  different types of relaxation techniques to limit stress.   Warning Signs of Stroke / TIA -Discuss definition of a stroke, what the signs and symptoms are of a stroke, and how to identify when someone is having stroke.   Knowledge Questionnaire Score:     Knowledge Questionnaire Score - 10/22/15 1521      Knowledge Questionnaire Score   Pre Score 23/24      Core Components/Risk Factors/Patient  Goals at Admission:     Personal Goals and Risk Factors at Admission - 10/22/15 1522      Core Components/Risk Factors/Patient Goals on Admission    Weight Management Weight Maintenance   Increase Strength and Stamina Yes   Intervention Provide advice, education, support and counseling about physical activity/exercise needs.;Develop an individualized exercise prescription for aerobic and resistive training based on initial evaluation findings, risk stratification, comorbidities and participant's personal goals.   Expected Outcomes Achievement of increased cardiorespiratory fitness and enhanced flexibility, muscular endurance and strength shown through measurements of functional capacity and personal statement of participant.   Diabetes Yes   Intervention Provide education about signs/symptoms and action to take for hypo/hyperglycemia.   Expected Outcomes Long Term: Attainment of HbA1C < 7%.   Stress Yes   Intervention Offer individual and/or small group education and counseling on adjustment to heart disease, stress management and health-related lifestyle change. Teach and support self-help strategies.   Expected Outcomes Short Term: Participant demonstrates changes in health-related behavior, relaxation and other stress management skills, ability to obtain effective social support, and compliance with psychotropic medications if prescribed.;Long Term: Emotional wellbeing is indicated by absence of clinically significant psychosocial distress or social isolation.   Personal Goal Other Yes   Personal Goal Patient wants to increase her strenght and stamina. She also want to make regular exercise a part of her lifestyle.     Intervention Patient will exercise 3 days/week in program and supplement with 2 days/week at home.    Expected Outcomes Patient will meet her above stated goals.       Core Components/Risk Factors/Patient Goals Review:      Goals and Risk Factor Review    Row Name  10/22/15 1526 11/09/15 1437           Core Components/Risk Factors/Patient Goals Review   Personal Goals Review Increase Strength and Stamina;Weight Management/Obesity;Stress Weight Management/Obesity;Increase Strength and Stamina;Other  Patient wants to increase her strenght and stamina. She also want to make regular exercise a part of her lifestyle.       Review  - After 5 sessions, patient has gained 0.5 lbs. She is doing well in the program. She says she feels better and has seen a slight improvement in her strength.       Expected Outcomes  - Patient will continue to attend sessions maintaining her weight with improved strength and stamina.          Core Components/Risk Factors/Patient Goals at Discharge (Final Review):      Goals and Risk Factor Review - 11/09/15 1437      Core Components/Risk Factors/Patient Goals Review   Personal Goals Review Weight Management/Obesity;Increase Strength and Stamina;Other  Patient wants to increase her strenght and stamina. She also want to make regular exercise a part of her lifestyle.    Review After 5 sessions, patient has gained 0.5 lbs. She is doing well in the program. She says she feels better and has seen a slight improvement in her strength.    Expected Outcomes Patient will  continue to attend sessions maintaining her weight with improved strength and stamina.       ITP Comments:     ITP Comments    Row Name 11/01/15 0900           ITP Comments Patient met with Registered Dietitian to discuss nutrition topics including: Heart healthty eating, heart healthy cooking and making smart choices when shopping; Portion control; weight management; and hydration. He also attended a class with hospital chaplian called [facility] to discuss how his diagnosis has impacted his life.          Comments: 30 Day Review: Patient doing well with program. Will continue to monitor for progress.

## 2015-11-09 NOTE — Progress Notes (Signed)
Daily Session Note  Patient Details  Name: Catherine Mays MRN: [id number] Date of Birth: [birth year] Referring Provider:    Encounter Date: 11/09/2015  Check In:     Session Check In - 11/09/15 1545      Check-In   Location [facility]   Staff Present Diane Angelina Pih, MS, EP, [facility], Exercise Physiologist;Rollen Selders Wynetta Emery, RN, BSN   Supervising physician immediately available to respond to emergencies See telemetry face sheet for immediately available MD   Medication changes reported     No   Fall or balance concerns reported    No   Warm-up and Cool-down Performed as group-led instruction   Resistance Training Performed Yes   VAD Patient? No     Pain Assessment   Currently in Pain? No/denies   Pain Score 0-No pain   Multiple Pain Sites No      Capillary Blood Glucose: No results found for this or any previous visit (from the past 24 hour(s)).   Goals Met:  Independence with exercise equipment Exercise tolerated well No report of cardiac concerns or symptoms Strength training completed today  Goals Unmet:  Not Applicable  Comments: Check out 1645.   Dr. Kate Sable is Medical Director for [facility] Cardiac and Pulmonary Rehab.

## 2015-11-12 ENCOUNTER — Encounter (HOSPITAL_COMMUNITY): Payer: BLUE CROSS/BLUE SHIELD

## 2015-11-14 ENCOUNTER — Encounter (HOSPITAL_COMMUNITY): Payer: BLUE CROSS/BLUE SHIELD

## 2015-11-14 ENCOUNTER — Telehealth: Payer: Self-pay | Admitting: Cardiovascular Disease

## 2015-11-14 MED ORDER — ZOLPIDEM TARTRATE 5 MG PO TABS: 5.0000 mg | ORAL_TABLET | Freq: Every evening | ORAL | 0 refills | Status: DC | PRN

## 2015-11-14 NOTE — Telephone Encounter (Signed)
Pt notified. She will follow up with primary care for further refills. Prescription called to [facility] in [place].

## 2015-11-14 NOTE — Telephone Encounter (Signed)
New Message  Pt call requesting to speak with RN. Pt states she is having a hard time sleeping at night. Pt stats it was not bad after leaving the hospital, but it has gotten worse. Pt would like to know if there is something the Dr. Clifton James can prescribe to help with lack of sleep. Please call back to discuss.

## 2015-11-14 NOTE — Telephone Encounter (Signed)
Spoke with Catherine Mays. She reports she is not sleeping well and is asking if there is something she can take. She is no longer seeing her previous primary care provider and is not seeing new provider (Dr. Catalina Pizza) until October 1,2017. Will forward to Dr. Clifton James for review/recommendations.

## 2015-11-14 NOTE — Telephone Encounter (Signed)
We can start Ambien 5 mg QHS prn. 30 pills. cdm

## 2015-11-16 ENCOUNTER — Encounter (HOSPITAL_COMMUNITY)
Admission: RE | Admit: 2015-11-16 | Discharge: 2015-11-16 | Disposition: A | Discharge status: Home / Self Care | Payer: BLUE CROSS/BLUE SHIELD | Source: Ambulatory Visit | Attending: Cardiovascular Disease | Admitting: Cardiovascular Disease

## 2015-11-16 ENCOUNTER — Encounter (HOSPITAL_COMMUNITY): Payer: BLUE CROSS/BLUE SHIELD

## 2015-11-16 DIAGNOSIS — I213 ST elevation (STEMI) myocardial infarction of unspecified site: Secondary | ICD-10-CM | POA: Diagnosis not present

## 2015-11-16 NOTE — Progress Notes (Signed)
Daily Session Note  Patient Details  Name: Catherine Mays MRN: [id number] Date of Birth: [birth year] Referring Provider:    Encounter Date: 11/16/2015  Check In:     Session Check In - 11/16/15 1545      Check-In   Location [facility]   Staff Present Diane Angelina Pih, MS, EP, [facility], Exercise Physiologist;Marilyn Wing Wynetta Emery, RN, BSN   Supervising physician immediately available to respond to emergencies See telemetry face sheet for immediately available MD   Medication changes reported     No   Fall or balance concerns reported    No   Warm-up and Cool-down Performed as group-led instruction   Resistance Training Performed No   VAD Patient? No     Pain Assessment   Currently in Pain? No/denies   Pain Score 0-No pain   Multiple Pain Sites No      Capillary Blood Glucose: No results found for this or any previous visit (from the past 24 hour(s)).   Goals Met:  Independence with exercise equipment Exercise tolerated well No report of cardiac concerns or symptoms Strength training completed today  Goals Unmet:  Not Applicable  Comments: Check out 1645.   Dr. Kate Sable is Medical Director for [facility] Cardiac and Pulmonary Rehab.

## 2015-11-19 ENCOUNTER — Encounter (HOSPITAL_COMMUNITY)
Admission: RE | Admit: 2015-11-19 | Discharge: 2015-11-19 | Disposition: A | Discharge status: Home / Self Care | Payer: BLUE CROSS/BLUE SHIELD | Source: Ambulatory Visit | Attending: Cardiovascular Disease | Admitting: Cardiovascular Disease

## 2015-11-19 ENCOUNTER — Encounter (HOSPITAL_COMMUNITY): Payer: BLUE CROSS/BLUE SHIELD

## 2015-11-19 DIAGNOSIS — Z955 Presence of coronary angioplasty implant and graft: Secondary | ICD-10-CM

## 2015-11-19 DIAGNOSIS — I213 ST elevation (STEMI) myocardial infarction of unspecified site: Secondary | ICD-10-CM

## 2015-11-19 NOTE — Progress Notes (Signed)
Daily Session Note  Patient Details  Name: Alis Sawchuk MRN: [id number] Date of Birth: 05-20-1965 Referring Provider:    Encounter Date: 11/19/2015  Check In:     Session Check In - 11/19/15 1545      Check-In   Location [facility]   Staff Present Keegan Ducey Angelina Pih, MS, EP, [facility], Exercise Physiologist;Debra Wynetta Emery, RN, BSN   Supervising physician immediately available to respond to emergencies See telemetry face sheet for immediately available MD   Medication changes reported     No   Fall or balance concerns reported    No   Warm-up and Cool-down Performed as group-led instruction   Resistance Training Performed No   VAD Patient? No     Pain Assessment   Currently in Pain? No/denies   Pain Score 0-No pain   Multiple Pain Sites No      Capillary Blood Glucose: No results found for this or any previous visit (from the past 24 hour(s)).   Goals Met:  Independence with exercise equipment Exercise tolerated well No report of cardiac concerns or symptoms Strength training completed today  Goals Unmet:  Not Applicable  Comments: Check out: 4:45   Dr. Kate Sable is Medical Director for [place] and Pulmonary Rehab.

## 2015-11-21 ENCOUNTER — Encounter (HOSPITAL_COMMUNITY): Payer: BLUE CROSS/BLUE SHIELD

## 2015-11-21 ENCOUNTER — Encounter (HOSPITAL_COMMUNITY)
Admission: RE | Admit: 2015-11-21 | Discharge: 2015-11-21 | Disposition: A | Discharge status: Home / Self Care | Payer: BLUE CROSS/BLUE SHIELD | Source: Ambulatory Visit | Attending: Cardiovascular Disease | Admitting: Cardiovascular Disease

## 2015-11-21 DIAGNOSIS — I213 ST elevation (STEMI) myocardial infarction of unspecified site: Secondary | ICD-10-CM

## 2015-11-21 DIAGNOSIS — Z955 Presence of coronary angioplasty implant and graft: Secondary | ICD-10-CM

## 2015-11-21 NOTE — Progress Notes (Signed)
Daily Session Note  Patient Details  Name: Catherine Mays MRN: [id number] Date of Birth: 01/05/1966 Referring Provider:    Encounter Date: 11/21/2015  Check In:     Session Check In - 11/21/15 1545      Check-In   Location [facility]   Staff Present Donnice Nielsen Angelina Pih, MS, EP, [facility], Exercise Physiologist;Debra Wynetta Emery, RN, BSN   Supervising physician immediately available to respond to emergencies See telemetry face sheet for immediately available MD   Medication changes reported     No   Fall or balance concerns reported    No   Warm-up and Cool-down Performed as group-led instruction   Resistance Training Performed No   VAD Patient? No     Pain Assessment   Currently in Pain? No/denies   Pain Score 0-No pain   Multiple Pain Sites No      Capillary Blood Glucose: No results found for this or any previous visit (from the past 24 hour(s)).   Goals Met:  Independence with exercise equipment  Goals Unmet:  Not Applicable  Comments: Check out: 4:45   Dr. Kate Sable is Medical Director for [place] and Pulmonary Rehab.

## 2015-11-23 ENCOUNTER — Encounter (HOSPITAL_COMMUNITY): Payer: BLUE CROSS/BLUE SHIELD

## 2015-11-23 ENCOUNTER — Inpatient Hospital Stay (HOSPITAL_COMMUNITY)
Admission: AD | Admit: 2015-11-23 | Discharge: 2015-11-26 | DRG: 280 | Disposition: A | Discharge status: Home / Self Care | Payer: BLUE CROSS/BLUE SHIELD | Source: Ambulatory Visit | Attending: Cardiovascular Disease | Admitting: Cardiovascular Disease

## 2015-11-23 ENCOUNTER — Telehealth: Payer: Self-pay | Admitting: Cardiovascular Disease

## 2015-11-23 ENCOUNTER — Ambulatory Visit: Payer: BLUE CROSS/BLUE SHIELD | Admitting: Cardiology

## 2015-11-23 ENCOUNTER — Ambulatory Visit (INDEPENDENT_AMBULATORY_CARE_PROVIDER_SITE_OTHER): Payer: BLUE CROSS/BLUE SHIELD | Admitting: Cardiovascular Disease

## 2015-11-23 VITALS — BP 110/70 | HR 74 | Ht 69.0 in | Wt 152.6 lb

## 2015-11-23 DIAGNOSIS — I509 Heart failure, unspecified: Secondary | ICD-10-CM | POA: Diagnosis not present

## 2015-11-23 DIAGNOSIS — R7303 Prediabetes: Secondary | ICD-10-CM | POA: Diagnosis present

## 2015-11-23 DIAGNOSIS — I255 Ischemic cardiomyopathy: Secondary | ICD-10-CM

## 2015-11-23 DIAGNOSIS — R05 Cough: Secondary | ICD-10-CM | POA: Diagnosis not present

## 2015-11-23 DIAGNOSIS — I5023 Acute on chronic systolic (congestive) heart failure: Secondary | ICD-10-CM | POA: Diagnosis not present

## 2015-11-23 DIAGNOSIS — I252 Old myocardial infarction: Secondary | ICD-10-CM

## 2015-11-23 DIAGNOSIS — I5021 Acute systolic (congestive) heart failure: Secondary | ICD-10-CM | POA: Diagnosis not present

## 2015-11-23 DIAGNOSIS — E039 Hypothyroidism, unspecified: Secondary | ICD-10-CM | POA: Diagnosis not present

## 2015-11-23 DIAGNOSIS — G4700 Insomnia, unspecified: Secondary | ICD-10-CM | POA: Diagnosis present

## 2015-11-23 DIAGNOSIS — I2109 ST elevation (STEMI) myocardial infarction involving other coronary artery of anterior wall: Secondary | ICD-10-CM | POA: Diagnosis not present

## 2015-11-23 DIAGNOSIS — I480 Paroxysmal atrial fibrillation: Secondary | ICD-10-CM | POA: Diagnosis not present

## 2015-11-23 DIAGNOSIS — R110 Nausea: Secondary | ICD-10-CM | POA: Diagnosis not present

## 2015-11-23 DIAGNOSIS — R630 Anorexia: Secondary | ICD-10-CM | POA: Diagnosis present

## 2015-11-23 DIAGNOSIS — Z8249 Family history of ischemic heart disease and other diseases of the circulatory system: Secondary | ICD-10-CM

## 2015-11-23 DIAGNOSIS — Z955 Presence of coronary angioplasty implant and graft: Secondary | ICD-10-CM

## 2015-11-23 DIAGNOSIS — Z7901 Long term (current) use of anticoagulants: Secondary | ICD-10-CM | POA: Diagnosis not present

## 2015-11-23 DIAGNOSIS — T464X5A Adverse effect of angiotensin-converting-enzyme inhibitors, initial encounter: Secondary | ICD-10-CM | POA: Diagnosis not present

## 2015-11-23 DIAGNOSIS — I2510 Atherosclerotic heart disease of native coronary artery without angina pectoris: Secondary | ICD-10-CM | POA: Diagnosis not present

## 2015-11-23 DIAGNOSIS — R634 Abnormal weight loss: Secondary | ICD-10-CM | POA: Diagnosis not present

## 2015-11-23 DIAGNOSIS — Z79899 Other long term (current) drug therapy: Secondary | ICD-10-CM

## 2015-11-23 LAB — COMPREHENSIVE METABOLIC PANEL
ALT: 60 U/L — ABNORMAL HIGH (ref 14–54)
AST: 37 U/L (ref 15–41)
Albumin: 3.6 g/dL (ref 3.5–5.0)
Alkaline Phosphatase: 82 U/L (ref 38–126)
Anion gap: 10 (ref 5–15)
BUN: 17 mg/dL (ref 6–20)
CO2: 25 mmol/L (ref 22–32)
Calcium: 8.6 mg/dL — ABNORMAL LOW (ref 8.9–10.3)
Chloride: 101 mmol/L (ref 101–111)
Creatinine, Ser: 1.33 mg/dL — ABNORMAL HIGH (ref 0.44–1.00)
GFR calc Af Amer: 53 mL/min — ABNORMAL LOW (ref >60)
GFR calc non Af Amer: 46 mL/min — ABNORMAL LOW (ref >60)
Glucose, Bld: 136 mg/dL — ABNORMAL HIGH (ref 65–99)
Potassium: 3.3 mmol/L — ABNORMAL LOW (ref 3.5–5.1)
Sodium: 136 mmol/L (ref 135–145)
Total Bilirubin: 1.2 mg/dL (ref 0.3–1.2)
Total Protein: 6.4 g/dL — ABNORMAL LOW (ref 6.5–8.1)

## 2015-11-23 LAB — CBC WITH DIFFERENTIAL/PLATELET
Basophils Absolute: 0 10*3/uL (ref 0.0–0.1)
Basophils Relative: 1 %
Eosinophils Absolute: 0.3 10*3/uL (ref 0.0–0.7)
Eosinophils Relative: 4 %
HCT: 33.9 % — ABNORMAL LOW (ref 36.0–46.0)
Hemoglobin: 10.8 g/dL — ABNORMAL LOW (ref 12.0–15.0)
Lymphocytes Relative: 32 %
Lymphs Abs: 2.6 10*3/uL (ref 0.7–4.0)
MCH: 30.3 pg (ref 26.0–34.0)
MCHC: 31.9 g/dL (ref 30.0–36.0)
MCV: 95 fL (ref 78.0–100.0)
Monocytes Absolute: 0.8 10*3/uL (ref 0.1–1.0)
Monocytes Relative: 10 %
Neutro Abs: 4.3 10*3/uL (ref 1.7–7.7)
Neutrophils Relative %: 53 %
Platelets: 255 10*3/uL (ref 150–400)
RBC: 3.57 MIL/uL — ABNORMAL LOW (ref 3.87–5.11)
RDW: 14.9 % (ref 11.5–15.5)
WBC: 8 10*3/uL (ref 4.0–10.5)

## 2015-11-23 LAB — MAGNESIUM: Magnesium: 1.8 mg/dL (ref 1.7–2.4)

## 2015-11-23 LAB — TROPONIN I: Troponin I: <0.03 ng/mL (ref <0.03)

## 2015-11-23 LAB — BRAIN NATRIURETIC PEPTIDE: B Natriuretic Peptide: 669.2 pg/mL — ABNORMAL HIGH (ref 0.0–100.0)

## 2015-11-23 LAB — TSH: TSH: 3.212 u[IU]/mL (ref 0.350–4.500)

## 2015-11-23 MED ORDER — ONDANSETRON HCL 4 MG/2ML IJ SOLN: 4.0000 mg | Freq: Four times a day (QID) | INTRAMUSCULAR | Status: DC | PRN

## 2015-11-23 MED ORDER — SODIUM CHLORIDE 0.9% FLUSH
3.0000 mL | Freq: Two times a day (BID) | INTRAVENOUS | Status: DC
  Administered 2015-11-23 – 2015-11-25 (×4): 3 mL via INTRAVENOUS

## 2015-11-23 MED ORDER — LISINOPRIL 2.5 MG PO TABS
2.5000 mg | ORAL_TABLET | Freq: Every day | ORAL | Status: DC
  Filled 2015-11-23: qty 1

## 2015-11-23 MED ORDER — CARVEDILOL 3.125 MG PO TABS
3.1250 mg | ORAL_TABLET | Freq: Two times a day (BID) | ORAL | Status: DC
  Administered 2015-11-24 – 2015-11-26 (×5): 3.125 mg via ORAL
  Filled 2015-11-23 (×5): qty 1

## 2015-11-23 MED ORDER — FUROSEMIDE 10 MG/ML IJ SOLN
40.0000 mg | Freq: Two times a day (BID) | INTRAMUSCULAR | Status: DC
  Administered 2015-11-23 – 2015-11-24 (×2): 40 mg via INTRAVENOUS
  Filled 2015-11-23 (×2): qty 4

## 2015-11-23 MED ORDER — SODIUM CHLORIDE 0.9 % IV SOLN: 250.0000 mL | INTRAVENOUS | Status: DC | PRN

## 2015-11-23 MED ORDER — DIPHENHYDRAMINE HCL 25 MG PO CAPS
25.0000 mg | ORAL_CAPSULE | Freq: Once | ORAL | Status: AC
  Administered 2015-11-23: 25 mg via ORAL
  Filled 2015-11-23: qty 1

## 2015-11-23 MED ORDER — ATORVASTATIN CALCIUM 80 MG PO TABS
80.0000 mg | ORAL_TABLET | Freq: Every day | ORAL | Status: DC
  Administered 2015-11-23 – 2015-11-25 (×3): 80 mg via ORAL
  Filled 2015-11-23 (×3): qty 1

## 2015-11-23 MED ORDER — ACETAMINOPHEN 325 MG PO TABS: 650.0000 mg | ORAL_TABLET | ORAL | Status: DC | PRN

## 2015-11-23 MED ORDER — NITROGLYCERIN 0.4 MG SL SUBL: 0.4000 mg | SUBLINGUAL_TABLET | SUBLINGUAL | Status: DC | PRN

## 2015-11-23 MED ORDER — RIVAROXABAN 20 MG PO TABS
20.0000 mg | ORAL_TABLET | Freq: Every day | ORAL | Status: DC
  Administered 2015-11-23 – 2015-11-24 (×2): 20 mg via ORAL
  Filled 2015-11-23 (×2): qty 1

## 2015-11-23 MED ORDER — LEVOTHYROXINE SODIUM 25 MCG PO TABS
125.0000 ug | ORAL_TABLET | Freq: Every day | ORAL | Status: DC
  Administered 2015-11-24 – 2015-11-26 (×3): 125 ug via ORAL
  Filled 2015-11-23 (×3): qty 1

## 2015-11-23 MED ORDER — TRIAMCINOLONE ACETONIDE 55 MCG/ACT NA AERO
2.0000 | INHALATION_SPRAY | Freq: Every day | NASAL | Status: DC
  Administered 2015-11-24 – 2015-11-26 (×3): 2 via NASAL
  Filled 2015-11-23: qty 21.6

## 2015-11-23 MED ORDER — PANTOPRAZOLE SODIUM 40 MG PO TBEC
40.0000 mg | DELAYED_RELEASE_TABLET | Freq: Every day | ORAL | Status: DC
  Administered 2015-11-24 – 2015-11-26 (×3): 40 mg via ORAL
  Filled 2015-11-23 (×3): qty 1

## 2015-11-23 MED ORDER — TICAGRELOR 90 MG PO TABS
90.0000 mg | ORAL_TABLET | Freq: Two times a day (BID) | ORAL | Status: DC
  Administered 2015-11-23 – 2015-11-26 (×6): 90 mg via ORAL
  Filled 2015-11-23 (×6): qty 1

## 2015-11-23 MED ORDER — SODIUM CHLORIDE 0.9% FLUSH: 3.0000 mL | INTRAVENOUS | Status: DC | PRN

## 2015-11-23 NOTE — H&P (Signed)
Chief Complaint  Patient presents with  . Shortness of Breath     History of Present Illness: 50 y.o.femalewith history of CAD (STEMI 08/2015 s/p PTCA/DES to mid LAD, mild residual mRCA), ischemic cardiomyopathy, chronic systolic CHF, paroxysmal atrial fib (dx at time of STEMI), PVCs, hypotension who is here today for follow up. Her cardiac issues are all recent as of summer 2017. She had presented to the [facility] ED with chest pain that night and was sent home. She came back in later that night with continued chest pain and EKG showed ST elevation c/w an anterior MI. Emergent transport to [facility] and cardiac cath with occluded mid LAD treated with DES x 1. Severe LV systolic dysfunction noted post cath. Continued chest pain and relook cath 09/21/15 demonstrated patency of the LAD stent. Echo 09/20/15: EF 30-35%, akinesis of apical anteroseptal and anterior myocardium, grade 1 DD, no evidence of thrombus, PASP 30. Her hospitalization was complicated by atrial fibrillation. She was discharged with a LifeVest for LV dysfunction, amiodarone for her atrial fib, and triple blood thinner therapy for both her MI and afib. ASA stopped after one month. Coreg cut back due to fatigue. Aldactone stopped due to worsened kidney function. She has been in cardiac rehab and doing well without chest pain or dyspnea. She has had a poor appetite since her MI. She has lost 20 lbs.   She is added onto my schedule today after she called our office with c/o nausea, dyspnea, weakness, fatigue, cough. She has been coughing for 3 weeks. She can only sleep sitting up. When she is supine, she coughs. She was seen in an urgent care and given an antibiotic last week with no improvement in dyspnea. She has no energy. Her feet and hands are cold. She has had ongoing nausea with no appetite. 20 lb weight loss over last 8 weeks due to poor appetite. No LE edema. She has had no chest pain.   Primary Care Physician: Dwana Melena,  MD       Past Medical History:  Diagnosis Date  . CAD in native artery   . Chronic systolic CHF (congestive heart failure) (HCC)   . Hypotension    a. preventing med titration for HF.  Marland Kitchen Hypothyroidism 09/24/2015  . Ischemic cardiomyopathy   . PAF (paroxysmal atrial fibrillation) (HCC) 09/24/2015   a. dx at time of STEMI 08/2015.  . Pre-diabetes   . PVC's (premature ventricular contractions)          Past Surgical History:  Procedure Laterality Date  . CARDIAC CATHETERIZATION N/A 09/20/2015   Procedure: Left Heart Cath and Coronary Angiography;  Surgeon: Kathleene Hazel, MD;  Location[facility] INVASIVE CV LAB;  Service: Cardiovascular;  Laterality: N/A;  . CARDIAC CATHETERIZATION N/A 09/20/2015   Procedure: Coronary Stent Intervention;  Surgeon: Kathleene Hazel, MD;  Location: [facility] INVASIVE CV LAB;  Service: Cardiovascular;  Laterality: N/A;  . CARDIAC CATHETERIZATION N/A 09/21/2015   Procedure: Left Heart Cath and Coronary Angiography;  Surgeon: Tonny Bollman, MD;  Location: [facility] INVASIVE CV LAB;  Service: Cardiovascular;  Laterality: N/A;  . CORONARY STENT PLACEMENT  09/20/2015  . THYROIDECTOMY            Current Outpatient Prescriptions  Medication Sig Dispense Refill  . amiodarone (PACERONE) 200 MG tablet Take 1 tablet (200 mg total) by mouth at bedtime.    Marland Kitchen atorvastatin (LIPITOR) 80 MG tablet Take 1 tablet (80 mg total) by mouth daily at  6 PM. 30 tablet 11  . Calcium Carb-Cholecalciferol (CALCIUM + D3 PO) Take 1 tablet by mouth every morning.    . carvedilol (COREG) 6.25 MG tablet Take 1 tablet (6.25 mg total) by mouth 2 (two) times daily with a meal. Increase in dose on next refill 60 tablet 11  . furosemide (LASIX) 20 MG tablet Take 1 tablet (20 mg total) by mouth as directed. 30 tablet 4  . lisinopril (PRINIVIL,ZESTRIL) 2.5 MG tablet Take 1 tablet (2.5 mg total) by mouth daily. 30 tablet 11  . nitroGLYCERIN (NITROSTAT) 0.4 MG SL tablet Place 1  tablet (0.4 mg total) under the tongue every 5 (five) minutes as needed for chest pain. 25 tablet 3  . pantoprazole (PROTONIX) 40 MG tablet Take 1 tablet (40 mg total) by mouth daily. 30 tablet 11  . rivaroxaban (XARELTO) 20 MG TABS tablet Take 1 tablet (20 mg total) by mouth daily with supper. 30 tablet 0  . SYNTHROID 125 MCG tablet Take 125 mcg by mouth every morning.  5  . ticagrelor (BRILINTA) 90 MG TABS tablet Take 1 tablet (90 mg total) by mouth 2 (two) times daily. 180 tablet 3  . triamcinolone (NASACORT ALLERGY 24HR) 55 MCG/ACT AERO nasal inhaler Place 2 sprays into the nose daily.    Marland Kitchen zolpidem (AMBIEN) 5 MG tablet Take 1 tablet (5 mg total) by mouth at bedtime as needed for sleep. 30 tablet 0   No current facility-administered medications for this visit.             Facility-Administered Medications Ordered in Other Visits  Medication Dose Route Frequency Provider Last Rate Last Dose  . bivalirudin (ANGIOMAX) 250 mg in sodium chloride 0.9 % 50 mL (5 mg/mL) infusion    Continuous PRN Kathleene Hazel, MD   Stopped at 09/20/15 [phone]  . bivalirudin (ANGIOMAX) BOLUS via infusion    PRN Kathleene Hazel, MD   54.45 mg at 09/20/15 4098  . fentaNYL (SUBLIMAZE) injection    PRN Kathleene Hazel, MD   25 mcg at 09/20/15 0651  . heparin 1,500 mL    PRN Kathleene Hazel, MD      . heparin infusion 2 units/mL in 0.9 % sodium chloride    Continuous PRN Kathleene Hazel, MD   1,500 mL at 09/20/15 0733  . iopamidol (ISOVUE-370) 76 % injection    PRN Kathleene Hazel, MD   165 mL at 09/20/15 0733  . lidocaine (PF) (XYLOCAINE) 1 % injection    PRN Kathleene Hazel, MD   2 mL at 09/20/15 1191  . midazolam (VERSED) injection    PRN Kathleene Hazel, MD   1 mg at 09/20/15 [phone]  . nitroGLYCERIN 1 mg/10 ml (100 mcg/ml) - IR/CATH LAB    PRN Kathleene Hazel, MD   200 mcg at 09/20/15 9562  . Radial Cocktail/Verapamil only    PRN Kathleene Hazel, MD   10 mL at 09/20/15 0629  . ticagrelor (BRILINTA) tablet    PRN Kathleene Hazel, MD   180 mg at 09/20/15 [phone]  . tirofiban (AGGRASTAT) bolus via infusion    PRN Kathleene Hazel, MD   1,815 mcg at 09/20/15 [phone]  . tirofiban (AGGRASTAT) infusion 50 mcg/mL 100 mL    Continuous PRN Kathleene Hazel, MD 13.1 mL/hr at 09/20/15 0651 0.15 mcg/kg/min at 09/20/15 0651    No Known Allergies  Social History        Social History  .  Marital status: Married    Spouse name: N/A  . Number of children: N/A  . Years of education: N/A      Occupational History  . Not on file.       Social History Main Topics  . Smoking status: Never Smoker  . Smokeless tobacco: Never Used  . Alcohol use No  . Drug use: No  . Sexual activity: Not on file       Other Topics Concern  . Not on file   Social History Narrative  . No narrative on file         Family History  Problem Relation Age of Onset  . Heart attack Father   . Arrhythmia Mother     Review of Systems:  As stated in the HPI and otherwise negative.   BP 110/70   Pulse 74   Ht 5\' 9"  (1.753 m)   Wt 152 lb 9.6 oz (69.2 kg)   BMI 22.54 kg/m   Physical Examination: General: Well developed, well nourished, NAD  HEENT: OP clear, mucus membranes moist  SKIN: warm, dry. No rashes. Neuro: No focal deficits  Musculoskeletal: Muscle strength 5/5 all ext  Psychiatric: Mood and affect normal  Neck: + JVD, no carotid bruits, no thyromegaly, no lymphadenopathy.  Lungs:Clear bilaterally, no wheezes, rhonci, crackles Cardiovascular: Regular rate and rhythm. No murmurs, gallops or rubs. Abdomen:Soft. Bowel sounds present. Non-tender.  Extremities: No lower extremity edema. Pulses are 2 + in the bilateral DP/PT.  EKG:  EKG is ordered today. The ekg ordered today demonstrates NSR, rate 74 bpm. Incomplete RBBB. Poor R wave progression precordial leads, unchanged.   Recent  Labs: 09/24/2015: ALT 31; TSH 6.184 09/25/2015: Hemoglobin 12.2; Platelets 270 10/24/2015: BUN 24; Creat 1.29; Potassium 3.9; Sodium 139   Lipid Panel Labs (Brief)          Component Value Date/Time   CHOL 93 (L) 10/24/2015 0809   TRIG 86 10/24/2015 0809   HDL 43 (L) 10/24/2015 0809   CHOLHDL 2.2 10/24/2015 0809   VLDL 17 10/24/2015 0809   LDLCALC 33 10/24/2015 0809          Wt Readings from Last 3 Encounters:  11/23/15 152 lb 9.6 oz (69.2 kg)  10/24/15 156 lb (70.8 kg)  10/22/15 153 lb 3.5 oz (69.5 kg)     Assessment and Plan: 50 yo female with recent anterior STEMI in July 2017 with ischemic cardiomyopathy who is now presenting to the office today with failure to thrive, weight loss, cool extremities, hypotension, orthopnea, cough and dyspnea.   1. CAD: She has had no chest pain suggestive of angina. She is now on Brilinta. ASA stopped after one month post MI.  Her EKG is unchanged. Her presentation is not consistent with ACS. Will continue Brilinta, statin and beta blocker. Cycle troponin  2. PAF: She is in sinus today. -Will d/c amiodarone with nausea.  -Continue Xarelto for now.  3. Chronic systolic CHF/Ischemic cardiomyopathy: She is having symptoms that are concerning for low cardiac output. She is actually more normotensive today than she has been over the past few weeks but she has ongoing nausea, orthopnea, cool extremities, weakness. She is wearing a Lifevest but Echo has not yet been repeated.  -Will admit to telemetry unit at [facility] tonight -Will check chest x-ray  - Will check CMET, CBC, troponin, BNP -If renal function is ok, will diurese with IV Lasix tonight -She may need a right heart cath next week -consider switching Lisinopril to  ARB given cough -We may need to involve the CHF team this weekend -Repeat echo tomorrow  4. Hypothyroidism: Check TSH  Signed, Verne Carrowhristopher McAlhany, MD 11/23/2015 5:03 PM    [facility]  [facility] [address], [place], [place]  [address] Phone: [phone](336) [phone]; Fax: [phone]

## 2015-11-23 NOTE — Progress Notes (Signed)
Chief Complaint  Patient presents with  . Shortness of Breath     History of Present Illness: 50 y.o. female with history of CAD (STEMI 08/2015 s/p PTCA/DES to mid LAD, mild residual mRCA), ischemic cardiomyopathy, chronic systolic CHF, paroxysmal atrial fib (dx at time of STEMI), PVCs, hypotension who is here today for follow up. Her cardiac issues are all recent as of summer 2017. She had presented to the [facility] ED with chest pain that night and was sent home. She came back in later that night with continued chest pain and EKG showed ST elevation c/w an anterior MI. Emergent transport to [facility] and cardiac cath with occluded mid LAD treated with DES x 1. Severe LV systolic dysfunction noted post cath. Continued chest pain and relook cath 09/21/15 demonstrated patency of the LAD stent. Echo 09/20/15: EF 30-35%, akinesis of apical anteroseptal and anterior myocardium, grade 1 DD, no evidence of thrombus, PASP 30. Her hospitalization was complicated by atrial fibrillation. She was discharged with a LifeVest for LV dysfunction, amiodarone for her atrial fib, and triple blood thinner therapy for both her MI and afib. ASA stopped after one month. Coreg cut back due to fatigue. Aldactone stopped due to worsened kidney function. She has been in cardiac rehab and doing well without chest pain or dyspnea. She has had a poor appetite since her MI. She has lost 20 lbs.   She is added onto my schedule today after she called our office with c/o nausea, dyspnea, weakness, fatigue, cough. She has been coughing for 3 weeks. She can only sleep sitting up. When she is supine, she coughs. She was seen in an urgent care and given an antibiotic last week with no improvement in dyspnea. She has no energy. Her feet and hands are cold. She has had ongoing nausea with no appetite. 20 lb weight loss over last 8 weeks due to poor appetite. No LE edema. She has had no chest pain.   Primary Care Physician: Dwana Melena,  MD   Past Medical History:  Diagnosis Date  . CAD in native artery   . Chronic systolic CHF (congestive heart failure) (HCC)   . Hypotension    a. preventing med titration for HF.  Marland Kitchen Hypothyroidism 09/24/2015  . Ischemic cardiomyopathy   . PAF (paroxysmal atrial fibrillation) (HCC) 09/24/2015   a. dx at time of STEMI 08/2015.  . Pre-diabetes   . PVC's (premature ventricular contractions)     Past Surgical History:  Procedure Laterality Date  . CARDIAC CATHETERIZATION N/A 09/20/2015   Procedure: Left Heart Cath and Coronary Angiography;  Surgeon: Kathleene Hazel, MD;  Location: [facility] INVASIVE CV LAB;  Service: Cardiovascular;  Laterality: N/A;  . CARDIAC CATHETERIZATION N/A 09/20/2015   Procedure: Coronary Stent Intervention;  Surgeon: Kathleene Hazel, MD;  Location: [facility] INVASIVE CV LAB;  Service: Cardiovascular;  Laterality: N/A;  . CARDIAC CATHETERIZATION N/A 09/21/2015   Procedure: Left Heart Cath and Coronary Angiography;  Surgeon: Tonny Bollman, MD;  Location: [facility] INVASIVE CV LAB;  Service: Cardiovascular;  Laterality: N/A;  . CORONARY STENT PLACEMENT  09/20/2015  . THYROIDECTOMY      Current Outpatient Prescriptions  Medication Sig Dispense Refill  . amiodarone (PACERONE) 200 MG tablet Take 1 tablet (200 mg total) by mouth at bedtime.    Marland Kitchen atorvastatin (LIPITOR) 80 MG tablet Take 1 tablet (80 mg total) by mouth daily at 6 PM. 30 tablet 11  . Calcium Carb-Cholecalciferol (CALCIUM + D3 PO) Take 1 tablet  by mouth every morning.    . carvedilol (COREG) 6.25 MG tablet Take 1 tablet (6.25 mg total) by mouth 2 (two) times daily with a meal. Increase in dose on next refill 60 tablet 11  . furosemide (LASIX) 20 MG tablet Take 1 tablet (20 mg total) by mouth as directed. 30 tablet 4  . lisinopril (PRINIVIL,ZESTRIL) 2.5 MG tablet Take 1 tablet (2.5 mg total) by mouth daily. 30 tablet 11  . nitroGLYCERIN (NITROSTAT) 0.4 MG SL tablet Place 1 tablet (0.4 mg total) under the tongue  every 5 (five) minutes as needed for chest pain. 25 tablet 3  . pantoprazole (PROTONIX) 40 MG tablet Take 1 tablet (40 mg total) by mouth daily. 30 tablet 11  . rivaroxaban (XARELTO) 20 MG TABS tablet Take 1 tablet (20 mg total) by mouth daily with supper. 30 tablet 0  . SYNTHROID 125 MCG tablet Take 125 mcg by mouth every morning.  5  . ticagrelor (BRILINTA) 90 MG TABS tablet Take 1 tablet (90 mg total) by mouth 2 (two) times daily. 180 tablet 3  . triamcinolone (NASACORT ALLERGY 24HR) 55 MCG/ACT AERO nasal inhaler Place 2 sprays into the nose daily.    Marland Kitchen. zolpidem (AMBIEN) 5 MG tablet Take 1 tablet (5 mg total) by mouth at bedtime as needed for sleep. 30 tablet 0   No current facility-administered medications for this visit.    Facility-Administered Medications Ordered in Other Visits  Medication Dose Route Frequency Provider Last Rate Last Dose  . bivalirudin (ANGIOMAX) 250 mg in sodium chloride 0.9 % 50 mL (5 mg/mL) infusion    Continuous PRN Kathleene Hazelhristopher D Tyronne Blann, MD   Stopped at 09/20/15 [phone]  . bivalirudin (ANGIOMAX) BOLUS via infusion    PRN Kathleene Hazelhristopher D Guneet Delpino, MD   54.45 mg at 09/20/15 [phone]  . fentaNYL (SUBLIMAZE) injection    PRN Kathleene Hazelhristopher D Neriah Brott, MD   25 mcg at 09/20/15 0651  . heparin 1,500 mL    PRN Kathleene Hazelhristopher D Zykee Avakian, MD      . heparin infusion 2 units/mL in 0.9 % sodium chloride    Continuous PRN Kathleene Hazelhristopher D Dandrae Kustra, MD   1,500 mL at 09/20/15 0733  . iopamidol (ISOVUE-370) 76 % injection    PRN Kathleene Hazelhristopher D Yennifer Segovia, MD   165 mL at 09/20/15 0733  . lidocaine (PF) (XYLOCAINE) 1 % injection    PRN Kathleene Hazelhristopher D Sruti Ayllon, MD   2 mL at 09/20/15 [phone]  . midazolam (VERSED) injection    PRN Kathleene Hazelhristopher D Akiera Allbaugh, MD   1 mg at 09/20/15 [phone]  . nitroGLYCERIN 1 mg/10 ml (100 mcg/ml) - IR/CATH LAB    PRN Kathleene Hazelhristopher D Kellin Fifer, MD   200 mcg at 09/20/15 [phone]  . Radial Cocktail/Verapamil only    PRN Kathleene Hazelhristopher D Nitish Roes, MD   10 mL at 09/20/15 0629  . ticagrelor  (BRILINTA) tablet    PRN Kathleene Hazelhristopher D Chantell Kunkler, MD   180 mg at 09/20/15 [phone]  . tirofiban (AGGRASTAT) bolus via infusion    PRN Kathleene Hazelhristopher D Keziah Drotar, MD   1,815 mcg at 09/20/15 [phone]  . tirofiban (AGGRASTAT) infusion 50 mcg/mL 100 mL    Continuous PRN Kathleene Hazelhristopher D Dianne Bady, MD 13.1 mL/hr at 09/20/15 0651 0.15 mcg/kg/min at 09/20/15 0651    No Known Allergies  Social History   Social History  . Marital status: Married    Spouse name: N/A  . Number of children: N/A  . Years of education: N/A   Occupational History  . Not on  file.   Social History Main Topics  . Smoking status: Never Smoker  . Smokeless tobacco: Never Used  . Alcohol use No  . Drug use: No  . Sexual activity: Not on file   Other Topics Concern  . Not on file   Social History Narrative  . No narrative on file    Family History  Problem Relation Age of Onset  . Heart attack Father   . Arrhythmia Mother     Review of Systems:  As stated in the HPI and otherwise negative.   BP 110/70   Pulse 74   Ht 5\' 9"  (1.753 m)   Wt 152 lb 9.6 oz (69.2 kg)   BMI 22.54 kg/m   Physical Examination: General: Well developed, well nourished, NAD  HEENT: OP clear, mucus membranes moist  SKIN: warm, dry. No rashes. Neuro: No focal deficits  Musculoskeletal: Muscle strength 5/5 all ext  Psychiatric: Mood and affect normal  Neck: + JVD, no carotid bruits, no thyromegaly, no lymphadenopathy.  Lungs:Clear bilaterally, no wheezes, rhonci, crackles Cardiovascular: Regular rate and rhythm. No murmurs, gallops or rubs. Abdomen:Soft. Bowel sounds present. Non-tender.  Extremities: No lower extremity edema. Pulses are 2 + in the bilateral DP/PT.  EKG:  EKG is ordered today. The ekg ordered today demonstrates NSR, rate 74 bpm. Incomplete RBBB. Poor R wave progression precordial leads, unchanged.   Recent Labs: 09/24/2015: ALT 31; TSH 6.184 09/25/2015: Hemoglobin 12.2; Platelets 270 10/24/2015: BUN 24; Creat 1.29; Potassium  3.9; Sodium 139   Lipid Panel    Component Value Date/Time   CHOL 93 (L) 10/24/2015 0809   TRIG 86 10/24/2015 0809   HDL 43 (L) 10/24/2015 0809   CHOLHDL 2.2 10/24/2015 0809   VLDL 17 10/24/2015 0809   LDLCALC 33 10/24/2015 0809     Wt Readings from Last 3 Encounters:  11/23/15 152 lb 9.6 oz (69.2 kg)  10/24/15 156 lb (70.8 kg)  10/22/15 153 lb 3.5 oz (69.5 kg)     Assessment and Plan: 50 yo female with recent anterior STEMI in July 2017 with ischemic cardiomyopathy who is now presenting to the office today with failure to thrive, weight loss, cool extremities, hypotension, orthopnea, cough and dyspnea.   1. CAD: She has had no chest pain suggestive of angina. She is now on Brilinta. ASA stopped after one month post MI.  Her EKG is unchanged. Her presentation is not consistent with ACS. Will continue Brilinta, statin and beta blocker. Cycle troponin  2. PAF: She is in sinus today. -Will d/c amiodarone with nausea.  -Continue Xarelto for now.  3. Chronic systolic CHF/Ischemic cardiomyopathy: She is having symptoms that are concerning for low cardiac output. She is actually more normotensive today than she has been over the past few weeks but she has ongoing nausea, orthopnea, cool extremities, weakness. She is wearing a Lifevest but Echo has not yet been repeated.  -Will admit to telemetry unit at [facility] tonight -Will check chest x-ray  - Will check CMET, CBC, troponin, BNP -If renal function is ok, will diurese with IV Lasix tonight -She may need a right heart cath next week -consider switching Lisinopril to ARB given cough -We may need to involve the CHF team this weekend -Repeat echo tomorrow  4. Hypothyroidism: Check TSH  Signed, Verne Carrow, MD 11/23/2015 5:03 PM    [facility] [address] [place], [place], [place]  [address] Phone: [phone]; Fax: [phone]

## 2015-11-23 NOTE — Telephone Encounter (Signed)
Pt's husband calling regarding pt having sleepliness, nausea some vomiting x 2weeks, recently having SOB upon lying down, some dizziness and lightheadedness-offered Mcalhany 10-4 or PA/NP Monday-husband states she needs appt today-pls call

## 2015-11-23 NOTE — Telephone Encounter (Signed)
Talked to Dr. Gibson Ramp nurse.  4:15 appt today was opened up.  Called patients husband and offered the appt for his wife.  He accepted the appt.

## 2015-11-23 NOTE — Patient Instructions (Signed)
Go to [facility] (Entrance A) of [facility] to be admitted.

## 2015-11-24 ENCOUNTER — Inpatient Hospital Stay (HOSPITAL_COMMUNITY): Payer: BLUE CROSS/BLUE SHIELD

## 2015-11-24 LAB — BASIC METABOLIC PANEL
Anion gap: 10 (ref 5–15)
BUN: 17 mg/dL (ref 6–20)
CO2: 27 mmol/L (ref 22–32)
Calcium: 8.5 mg/dL — ABNORMAL LOW (ref 8.9–10.3)
Chloride: 103 mmol/L (ref 101–111)
Creatinine, Ser: 1.25 mg/dL — ABNORMAL HIGH (ref 0.44–1.00)
GFR calc Af Amer: 58 mL/min — ABNORMAL LOW (ref >60)
GFR calc non Af Amer: 50 mL/min — ABNORMAL LOW (ref >60)
Glucose, Bld: 94 mg/dL (ref 65–99)
Potassium: 3.4 mmol/L — ABNORMAL LOW (ref 3.5–5.1)
Sodium: 140 mmol/L (ref 135–145)

## 2015-11-24 LAB — TROPONIN I
Troponin I: <0.03 ng/mL (ref <0.03)
Troponin I: <0.03 ng/mL (ref <0.03)

## 2015-11-24 MED ORDER — TRAZODONE HCL 50 MG PO TABS
25.0000 mg | ORAL_TABLET | Freq: Every evening | ORAL | Status: DC | PRN
  Administered 2015-11-24 – 2015-11-25 (×2): 25 mg via ORAL
  Filled 2015-11-24 (×2): qty 1

## 2015-11-24 MED ORDER — FUROSEMIDE 10 MG/ML IJ SOLN
40.0000 mg | Freq: Every day | INTRAMUSCULAR | Status: DC
  Filled 2015-11-24: qty 4

## 2015-11-24 MED ORDER — POTASSIUM CHLORIDE CRYS ER 20 MEQ PO TBCR
60.0000 meq | EXTENDED_RELEASE_TABLET | Freq: Every day | ORAL | Status: DC
  Administered 2015-11-24 – 2015-11-26 (×3): 60 meq via ORAL
  Filled 2015-11-24 (×3): qty 3

## 2015-11-24 NOTE — Progress Notes (Signed)
Dr. Diona Browner in to see patient, aware of patient Low BP orders received to hold Lisinopril today will monitor patient. Jamillah Camilo, Randall An RN

## 2015-11-24 NOTE — Progress Notes (Signed)
Patient blood pressure this AM 78/52 in right arm and 86/52 in left arm patient in SR on monitor rate 73, patient asymptomatic at this time. Will monitor patient.Jrue Jarriel, Randall An RN

## 2015-11-24 NOTE — Progress Notes (Signed)
Primary cardiologist: Dr. Earney Hamburg  Seen for followup: Cardiomyopathy, recent AMI  Subjective:    States that nausea is better today. No chest pain or breathlessness at rest. Has had recent orthopnea. Reports trouble falling asleep at nighttime.  Objective:   Temp:  [98.3 F (36.8 C)-98.9 F (37.2 C)] 98.3 F (36.8 C) (09/30 0554) Pulse Rate:  [72-99] 73 (09/30 0905) Resp:  [18] 18 (09/30 0554) BP: (78-124)/(52-84) 78/52 (09/30 0905) SpO2:  [98 %-100 %] 99 % (09/30 0554) Weight:  [147 lb 4.8 oz (66.8 kg)-152 lb 9.6 oz (69.2 kg)] 147 lb 4.8 oz (66.8 kg) (09/30 0554) Last BM Date: 11/24/15  Filed Weights   11/24/15 0554  Weight: 147 lb 4.8 oz (66.8 kg)    Intake/Output Summary (Last 24 hours) at 11/24/15 1110 Last data filed at 11/24/15 0727  Gross per 24 hour  Intake                0 ml  Output             2550 ml  Net            -2550 ml    Telemetry: Sinus rhythm with rare PVCs.  Exam:  General: Appears comfortable at rest.  Lungs: Decreased breath sounds at the bases.  Cardiac: No elevated JVP, RRR with indistinct PMI and no gallop.  Abdomen: NABS.  Extremities: Distal pulses 1-2+.  Lab Results:  Basic Metabolic Panel:  Recent Labs Lab 11/23/15 1858 11/24/15 0502  NA 136 140  K 3.3* 3.4*  CL 101 103  CO2 25 27  GLUCOSE 136* 94  BUN 17 17  CREATININE 1.33* 1.25*  CALCIUM 8.6* 8.5*  MG 1.8  --     Liver Function Tests:  Recent Labs Lab 11/23/15 1858  AST 37  ALT 60*  ALKPHOS 82  BILITOT 1.2  PROT 6.4*  ALBUMIN 3.6    CBC:  Recent Labs Lab 11/23/15 1858  WBC 8.0  HGB 10.8*  HCT 33.9*  MCV 95.0  PLT 255    Cardiac Enzymes:  Recent Labs Lab 11/23/15 1858 11/23/15 2307 11/24/15 0502  TROPONINI <0.03 <0.03 <0.03    BNP: 669  ECG: Tracing from 11/24/2015 showed rate-controlled atrial fibrillation with left anterior fascicular block and old anterior infarct pattern.   Chest x-ray  11/24/2015: FINDINGS: Moderate right and small left pleural effusions. Mild associated streaky lung base opacity consistent with atelectasis. No pulmonary edema or convincing pneumonia. No pneumothorax.  Cardiac silhouette is normal in size. Normal mediastinal and hilar contours.  Bony thorax is intact.  IMPRESSION: 1. Moderate right and small pleural effusions with mild associated basilar atelectasis. 2. No evidence of pneumonia or pulmonary edema.   Medications:   Scheduled Medications: . atorvastatin  80 mg Oral q1800  . carvedilol  3.125 mg Oral BID WC  . [START ON 11/25/2015] furosemide  40 mg Intravenous Daily  . levothyroxine  125 mcg Oral QAC breakfast  . pantoprazole  40 mg Oral Daily  . potassium chloride  60 mEq Oral Daily  . rivaroxaban  20 mg Oral Q supper  . sodium chloride flush  3 mL Intravenous Q12H  . ticagrelor  90 mg Oral BID  . triamcinolone  2 spray Nasal Daily     PRN Medications:  sodium chloride, acetaminophen, nitroGLYCERIN, ondansetron (ZOFRAN) IV, sodium chloride flush   Assessment:   1. Systolic heart failure with concern for low output, seen in the office yesterday by Dr.  McAlhany and admitted. Patient has had recent orthopnea, nausea, coughing, does have pleural effusions by chest x-ray, also relatively hypotensive although this is not new. She had a recent anterior wall MI in July with LVEF 30-35% by echocardiogram.  2. Paroxysmal atrial fibrillation, has been taken off of amiodarone given nausea. Has brief recurrence by ECG but is in sinus rhythm by telemetry this morning. CHADSVASC score is 3 and she is on Xarelto.  3. CAD with recent anterior STEMI status post DES to the LAD in July. She continues on aspirin and ticagrelor. No chest pain and troponin I levels are normal.  4. Hypothyroidism on Synthroid, recheck TSH 3.2.  5. Insomnia, Ambien has not been helpful at home, nor was Benadryl. May try low dose trazodone.  Plan/Discussion:     Reviewed chart, discussed with patient and husband. Amiodarone has been stopped, I will also stop lisinopril in light of cough and low blood pressure. Cut back Lasix to once daily dosing. Needs follow-up echocardiogram to reassess cardiac structure and function. If cardiomyopathy persists, would agree that right heart catheterization would be useful to better evaluate hemodynamics and guide treatment course. Will ask heart failure team to evaluate.   Jonelle SidleSamuel G. McDowell, M.D., F.A.C.C.

## 2015-11-25 ENCOUNTER — Encounter (HOSPITAL_COMMUNITY): Payer: Self-pay | Admitting: *Deleted

## 2015-11-25 ENCOUNTER — Inpatient Hospital Stay (HOSPITAL_COMMUNITY): Payer: BLUE CROSS/BLUE SHIELD

## 2015-11-25 DIAGNOSIS — I5023 Acute on chronic systolic (congestive) heart failure: Secondary | ICD-10-CM

## 2015-11-25 DIAGNOSIS — I509 Heart failure, unspecified: Secondary | ICD-10-CM

## 2015-11-25 LAB — ECHOCARDIOGRAM COMPLETE
E decel time: 113 msec
FS: 20 % — AB (ref 28–44)
Height: 69 in
IVS/LV PW RATIO, ED: 0.89
LA ID, A-P, ES: 41 mm
LA diam end sys: 41 mm
LA diam index: 2.3 cm/m2
LA vol A4C: 62.9 ml
LA vol index: 38.1 mL/m2
LA vol: 68 mL
LV PW d: 8.96 mm — AB (ref 0.6–1.1)
LV dias vol index: 87 mL/m2
LV dias vol: 156 mL — AB (ref 46–106)
LV sys vol index: 56 mL/m2
LV sys vol: 100 mL — AB (ref 14–42)
LVOT SV: 33 mL
LVOT VTI: 16.6 cm
LVOT area: 2.01 cm2
LVOT diameter: 16 mm
LVOT peak vel: 98.4 cm/s
Lateral S' vel: 10.4 cm/s
MV Dec: 113
MV Peak grad: 5 mmHg
MV pk A vel: 31 m/s
MV pk E vel: 107 m/s
PISA EROA: 0.15 cm2
RV sys press: 28 mmHg
Reg peak vel: 251 cm/s
Simpson's disk: 36
Stroke v: 56 ml
TAPSE: 20.3 mm
TR max vel: 251 cm/s
VTI: 124 cm
Weight: 2317.48 oz

## 2015-11-25 LAB — CBC
HCT: 32.7 % — ABNORMAL LOW (ref 36.0–46.0)
Hemoglobin: 10.3 g/dL — ABNORMAL LOW (ref 12.0–15.0)
MCH: 29.9 pg (ref 26.0–34.0)
MCHC: 31.5 g/dL (ref 30.0–36.0)
MCV: 95.1 fL (ref 78.0–100.0)
Platelets: 228 10*3/uL (ref 150–400)
RBC: 3.44 MIL/uL — ABNORMAL LOW (ref 3.87–5.11)
RDW: 15.2 % (ref 11.5–15.5)
WBC: 6.7 10*3/uL (ref 4.0–10.5)

## 2015-11-25 LAB — BASIC METABOLIC PANEL
Anion gap: 8 (ref 5–15)
BUN: 17 mg/dL (ref 6–20)
CO2: 28 mmol/L (ref 22–32)
Calcium: 8.6 mg/dL — ABNORMAL LOW (ref 8.9–10.3)
Chloride: 104 mmol/L (ref 101–111)
Creatinine, Ser: 1.32 mg/dL — ABNORMAL HIGH (ref 0.44–1.00)
GFR calc Af Amer: 54 mL/min — ABNORMAL LOW (ref >60)
GFR calc non Af Amer: 46 mL/min — ABNORMAL LOW (ref >60)
Glucose, Bld: 96 mg/dL (ref 65–99)
Potassium: 3.7 mmol/L (ref 3.5–5.1)
Sodium: 140 mmol/L (ref 135–145)

## 2015-11-25 MED ORDER — LOSARTAN POTASSIUM 25 MG PO TABS
12.5000 mg | ORAL_TABLET | Freq: Every day | ORAL | Status: DC
Start: 2015-11-25 — End: 2015-11-26
  Administered 2015-11-26: 12.5 mg via ORAL
  Filled 2015-11-25 (×2): qty 1

## 2015-11-25 MED ORDER — SODIUM CHLORIDE 0.9% FLUSH
3.0000 mL | Freq: Two times a day (BID) | INTRAVENOUS | Status: DC
  Administered 2015-11-25: 3 mL via INTRAVENOUS

## 2015-11-25 MED ORDER — SODIUM CHLORIDE 0.9 % IV SOLN: 250.0000 mL | INTRAVENOUS | Status: DC | PRN

## 2015-11-25 MED ORDER — SODIUM CHLORIDE 0.9 % IV SOLN
INTRAVENOUS | Status: DC
  Administered 2015-11-26: 07:00:00 via INTRAVENOUS

## 2015-11-25 MED ORDER — SODIUM CHLORIDE 0.9% FLUSH: 3.0000 mL | INTRAVENOUS | Status: DC | PRN

## 2015-11-25 MED ORDER — FUROSEMIDE 20 MG PO TABS
20.0000 mg | ORAL_TABLET | Freq: Every day | ORAL | Status: DC
  Administered 2015-11-25: 20 mg via ORAL
  Filled 2015-11-25: qty 1

## 2015-11-25 MED ORDER — POTASSIUM CHLORIDE CRYS ER 20 MEQ PO TBCR: 40.0000 meq | EXTENDED_RELEASE_TABLET | Freq: Once | ORAL | Status: DC

## 2015-11-25 MED ORDER — PERFLUTREN LIPID MICROSPHERE
1.0000 mL | INTRAVENOUS | Status: AC | PRN
  Administered 2015-11-25: 1 mL via INTRAVENOUS
  Filled 2015-11-25: qty 10

## 2015-11-25 NOTE — Progress Notes (Addendum)
New peripheral IV was inserted  to [facility] site for cath tomorrow.as ordered and instructed to RN from Dr. Shirlee Latch Patient tolerated well. Will continue to monitor patient. Kenzel Ruesch, Randall An RN

## 2015-11-25 NOTE — Progress Notes (Signed)
Patient ID: Catherine MeekerDeborah Mays, female   DOB: 09/24/1965, 50 y.o.   MRN: [id number]    50 yo with recent anterior MI and ischemic cardiomyopathy was admitted 9/29 with nausea, fatigue, cough, poor appetite, weight loss, and concern for low output HF.    She had anterior STEMI in 7/17 with DES to LAD.  No significant disease in other vessels.  Echo post-PCI showed EF 30-35%, anterior and anteroseptal akinesis.  She was sent home with a Lifevest.  Also noted to have paroxysmal atrial fibrillation in 7/17 while in the hospital and started on amiodarone.    At home, BP ran low and she had significant fatigue along with nausea, poor appetite, weight loss.  She saw her cardiologist Dr Catherine Mays on Friday and was admitted with the above symptoms.    On admission, amiodarone was stopped as it was thought to contribute to symptoms.  Lisinopril was stopped due to cough. She has received IV Lasix (once daily).  HF consult requested due to concern for low output.   Today, she feels significantly better.  Appetite is back, no more nausea, no more cough.  No dyspnea.  No chest pain.    Scheduled Meds: . atorvastatin  80 mg Oral q1800  . carvedilol  3.125 mg Oral BID WC  . furosemide  40 mg Intravenous Daily  . levothyroxine  125 mcg Oral QAC breakfast  . losartan  12.5 mg Oral Daily  . pantoprazole  40 mg Oral Daily  . potassium chloride  40 mEq Oral Once  . potassium chloride  60 mEq Oral Daily  . sodium chloride flush  3 mL Intravenous Q12H  . ticagrelor  90 mg Oral BID  . triamcinolone  2 spray Nasal Daily   Continuous Infusions:  PRN Meds:.sodium chloride, acetaminophen, nitroGLYCERIN, ondansetron (ZOFRAN) IV, sodium chloride flush, traZODone   Vitals:   11/24/15 1715 11/24/15 1949 11/24/15 2100 11/25/15 0619  BP: 102/69 (!) 92/57  93/60  Pulse: 73 72  72  Resp:  18  18  Temp:  98.5 F (36.9 C)  98.1 F (36.7 C)  TempSrc:  Oral  Oral  SpO2:  98%  95%  Weight:    144 lb 13.5 oz (65.7 kg)    Height:   5\' 9"  (1.753 m)     Intake/Output Summary (Last 24 hours) at 11/25/15 0920 Last data filed at 11/25/15 0841  Gross per 24 hour  Intake              520 ml  Output                0 ml  Net              520 ml    LABS: Basic Metabolic Panel:  Recent Labs  82/95/6209/29/17 1858 11/24/15 0502 11/25/15 0248  NA 136 140 140  K 3.3* 3.4* 3.7  CL 101 103 104  CO2 25 27 28   GLUCOSE 136* 94 96  BUN 17 17 17   CREATININE 1.33* 1.25* 1.32*  CALCIUM 8.6* 8.5* 8.6*  MG 1.8  --   --    Liver Function Tests:  Recent Labs  11/23/15 1858  AST 37  ALT 60*  ALKPHOS 82  BILITOT 1.2  PROT 6.4*  ALBUMIN 3.6   No results for input(s): LIPASE, AMYLASE in the last 72 hours. CBC:  Recent Labs  11/23/15 1858 11/25/15 0248  WBC 8.0 6.7  NEUTROABS 4.3  --   HGB 10.8* 10.3*  HCT 33.9* 32.7*  MCV 95.0 95.1  PLT 255 228   Cardiac Enzymes:  Recent Labs  11/23/15 1858 11/23/15 2307 11/24/15 0502  TROPONINI <0.03 <0.03 <0.03   BNP: Invalid input(s): POCBNP D-Dimer: No results for input(s): DDIMER in the last 72 hours. Hemoglobin A1C: No results for input(s): HGBA1C in the last 72 hours. Fasting Lipid Panel: No results for input(s): CHOL, HDL, LDLCALC, TRIG, CHOLHDL, LDLDIRECT in the last 72 hours. Thyroid Function Tests:  Recent Labs  11/23/15 1858  TSH 3.212   Anemia Panel: No results for input(s): VITAMINB12, FOLATE, FERRITIN, TIBC, IRON, RETICCTPCT in the last 72 hours.  RADIOLOGY: Dg Chest 2 View  Result Date: 11/24/2015 CLINICAL DATA:  Pt c/o nausea, dyspnea, weakness, fatigue, cough. She has been coughing for 3 weeks. She can only sleep sitting up. When she is supine, she coughs. Cardiac hx began on July 27th with a MI and includes CAD, CHF EXAM: CHEST  2 VIEW COMPARISON:  None. FINDINGS: Moderate right and small left pleural effusions. Mild associated streaky lung base opacity consistent with atelectasis. No pulmonary edema or convincing pneumonia. No  pneumothorax. Cardiac silhouette is normal in size. Normal mediastinal and hilar contours. Bony thorax is intact. IMPRESSION: 1. Moderate right and small pleural effusions with mild associated basilar atelectasis. 2. No evidence of pneumonia or pulmonary edema. Electronically Signed   By: Amie Portland M.D.   On: 11/24/2015 07:37    PHYSICAL EXAM General: NAD Neck: No JVD, no thyromegaly or thyroid nodule.  Lungs: Clear to auscultation bilaterally with normal respiratory effort. CV: Nondisplaced PMI.  Heart regular S1/S2, no S3/S4, no murmur.  No peripheral edema.  No carotid bruit.  Normal pedal pulses.  Abdomen: Soft, nontender, no hepatosplenomegaly, no distention.  Neurologic: Alert and oriented x 3.  Psych: Normal affect. Extremities: No clubbing or cyanosis.   TELEMETRY: Reviewed telemetry pt in NSR  ASSESSMENT AND PLAN: 50 yo with recent anterior MI and ischemic cardiomyopathy was admitted 9/29 with nausea, fatigue, cough, poor appetite, weight loss, and concern for low output HF.   1. CAD: Anterior MI 7/17 with DES to LAD.  No recurrent chest pain.  Doubt ischemia as driver for this admission.  - Continue ticagrelor, would keep off ASA.  Consider switching over to Plavix eventually due to need for anticoagulation, but will defer to Dr Catherine James.  - Continue Xarelto but will decrease her chronic dose to 15 mg daily as she is also on ticagrelor post-PCI.  Holding Xarelto today for RHC tomorrow, will then need to restart.  2. Atrial fibrillation: Paroxysmal.  Has been on amiodarone and Xarelto.  - As above, holding Xarelto today for RHC tomorrow, restart at 15 mg daily given ticagrelor use.  - She is now off amiodarone and feels markedly better, appetite back and no nausea.  It is possible that amiodarone was driving a lot of her symptoms.  Will leave off amiodarone, follow rhythm.  If atrial fibrillation becomes a problem, Tikosyn would be an option after amiodarone wash-out.  3. Acute on  chronic systolic CHF: Ischemic cardiomyopathy. EF 30-35% on echo during last hospitalization.  She was admitted with concern for low output HF but symptoms are much better.  She did get some diuresis while in the hospital, but I think stopping amiodarone (nausea/anorexia) and lisinopril (cough) helped more than anything else. On exam today, she does not look volume overloaded.  - I agree with Dr Diona Browner that RHC would be reasonable given her presenting symptoms.  I will set her up for RHC tomorrow, brachial access.  - Resume her po Lasix 20 daily today as she does not appear volume overloaded. - Continue low dose Coreg.  - Rather than low dose lisinopril, will have her take losartan 12.5 mg daily.  Will need to follow BP, hold for SBP < 90. Her BP is soft chronically but she denies lightheadedness.  - Echo today.   Marca Ancona 11/25/2015 9:34 AM

## 2015-11-25 NOTE — Progress Notes (Addendum)
Dr. Shirlee Latch in to see patient, and made aware that pt BP 81/52, patient currently asymptomatic And stated ok to hold Losartan for SPB <85 and orders received for lasix PO and OK to give. Will continue to monitor patient. Shaquitta Burbridge, Randall An RN

## 2015-11-25 NOTE — Progress Notes (Signed)
  Echocardiogram 2D Echocardiogram with definity has been performed.  Catherine Mays M 11/25/2015, 11:22 AM

## 2015-11-26 ENCOUNTER — Encounter (HOSPITAL_COMMUNITY)
Admission: AD | Disposition: A | Discharge status: Home / Self Care | Payer: Self-pay | Source: Ambulatory Visit | Attending: Cardiovascular Disease

## 2015-11-26 ENCOUNTER — Encounter (HOSPITAL_COMMUNITY): Payer: Self-pay | Admitting: Cardiology

## 2015-11-26 ENCOUNTER — Encounter (HOSPITAL_COMMUNITY): Payer: BLUE CROSS/BLUE SHIELD

## 2015-11-26 DIAGNOSIS — I509 Heart failure, unspecified: Secondary | ICD-10-CM

## 2015-11-26 HISTORY — PX: CARDIAC CATHETERIZATION: SHX172

## 2015-11-26 LAB — POCT I-STAT 3, VENOUS BLOOD GAS (G3P V)
Acid-base deficit: 1 mmol/L (ref 0.0–2.0)
Acid-base deficit: 1 mmol/L (ref 0.0–2.0)
Bicarbonate: 22.8 mmol/L (ref 20.0–28.0)
Bicarbonate: 23.3 mmol/L (ref 20.0–28.0)
O2 Saturation: 66 %
O2 Saturation: 68 %
TCO2: 24 mmol/L (ref 0–100)
TCO2: 24 mmol/L (ref 0–100)
pCO2, Ven: 35.1 mmHg — ABNORMAL LOW (ref 44.0–60.0)
pCO2, Ven: 35.5 mmHg — ABNORMAL LOW (ref 44.0–60.0)
pH, Ven: 7.422 (ref 7.250–7.430)
pH, Ven: 7.425 (ref 7.250–7.430)
pO2, Ven: 33 mmHg (ref 32.0–45.0)
pO2, Ven: 34 mmHg (ref 32.0–45.0)

## 2015-11-26 LAB — BASIC METABOLIC PANEL
Anion gap: 8 (ref 5–15)
BUN: 16 mg/dL (ref 6–20)
CO2: 26 mmol/L (ref 22–32)
Calcium: 8.6 mg/dL — ABNORMAL LOW (ref 8.9–10.3)
Chloride: 106 mmol/L (ref 101–111)
Creatinine, Ser: 1.24 mg/dL — ABNORMAL HIGH (ref 0.44–1.00)
GFR calc Af Amer: 58 mL/min — ABNORMAL LOW (ref >60)
GFR calc non Af Amer: 50 mL/min — ABNORMAL LOW (ref >60)
Glucose, Bld: 99 mg/dL (ref 65–99)
Potassium: 3.9 mmol/L (ref 3.5–5.1)
Sodium: 140 mmol/L (ref 135–145)

## 2015-11-26 LAB — PROTIME-INR
INR: 1.36
Prothrombin Time: 16.9 seconds — ABNORMAL HIGH (ref 11.4–15.2)

## 2015-11-26 SURGERY — RIGHT HEART CATH
Anesthesia: LOCAL

## 2015-11-26 MED ORDER — FUROSEMIDE 40 MG PO TABS
40.0000 mg | ORAL_TABLET | Freq: Every day | ORAL | Status: DC
  Filled 2015-11-26: qty 1

## 2015-11-26 MED ORDER — MIDAZOLAM HCL 2 MG/2ML IJ SOLN
INTRAMUSCULAR | Status: AC
  Filled 2015-11-26: qty 2

## 2015-11-26 MED ORDER — SODIUM CHLORIDE 0.9 % IV SOLN: 250.0000 mL | INTRAVENOUS | Status: DC | PRN

## 2015-11-26 MED ORDER — HEPARIN (PORCINE) IN NACL 2-0.9 UNIT/ML-% IJ SOLN
INTRAMUSCULAR | Status: DC | PRN
  Administered 2015-11-26: 500 mL

## 2015-11-26 MED ORDER — ONDANSETRON HCL 4 MG/2ML IJ SOLN: 4.0000 mg | Freq: Four times a day (QID) | INTRAMUSCULAR | Status: DC | PRN

## 2015-11-26 MED ORDER — FUROSEMIDE 20 MG PO TABS: 40.0000 mg | ORAL_TABLET | Freq: Every day | ORAL | 4 refills | Status: DC

## 2015-11-26 MED ORDER — SODIUM CHLORIDE 0.9% FLUSH: 3.0000 mL | Freq: Two times a day (BID) | INTRAVENOUS | Status: DC

## 2015-11-26 MED ORDER — SODIUM CHLORIDE 0.9% FLUSH: 3.0000 mL | INTRAVENOUS | Status: DC | PRN

## 2015-11-26 MED ORDER — RIVAROXABAN 15 MG PO TABS: 15.0000 mg | ORAL_TABLET | Freq: Every day | ORAL | 6 refills | Status: DC

## 2015-11-26 MED ORDER — RIVAROXABAN 15 MG PO TABS
15.0000 mg | ORAL_TABLET | Freq: Every day | ORAL | Status: DC
  Administered 2015-11-26: 15 mg via ORAL
  Filled 2015-11-26: qty 1

## 2015-11-26 MED ORDER — CARVEDILOL 3.125 MG PO TABS: 3.1250 mg | ORAL_TABLET | Freq: Two times a day (BID) | ORAL | 6 refills | Status: DC

## 2015-11-26 MED ORDER — LIDOCAINE HCL (PF) 1 % IJ SOLN
INTRAMUSCULAR | Status: DC | PRN
  Administered 2015-11-26: 2 mL

## 2015-11-26 MED ORDER — HEPARIN (PORCINE) IN NACL 2-0.9 UNIT/ML-% IJ SOLN
INTRAMUSCULAR | Status: AC
  Filled 2015-11-26: qty 500

## 2015-11-26 MED ORDER — FENTANYL CITRATE (PF) 100 MCG/2ML IJ SOLN
INTRAMUSCULAR | Status: AC
  Filled 2015-11-26: qty 2

## 2015-11-26 MED ORDER — TRAZODONE HCL 50 MG PO TABS: 25.0000 mg | ORAL_TABLET | Freq: Every evening | ORAL | 0 refills | Status: DC | PRN

## 2015-11-26 MED ORDER — LIDOCAINE HCL (PF) 1 % IJ SOLN
INTRAMUSCULAR | Status: AC
Start: 2015-11-26 — End: 2015-11-26
  Filled 2015-11-26: qty 30

## 2015-11-26 MED ORDER — MIDAZOLAM HCL 2 MG/2ML IJ SOLN
INTRAMUSCULAR | Status: DC | PRN
  Administered 2015-11-26: 0.5 mg via INTRAVENOUS

## 2015-11-26 MED ORDER — LOSARTAN POTASSIUM 25 MG PO TABS: 12.5000 mg | ORAL_TABLET | Freq: Every day | ORAL | 6 refills | Status: DC

## 2015-11-26 MED ORDER — ACETAMINOPHEN 325 MG PO TABS: 650.0000 mg | ORAL_TABLET | ORAL | Status: DC | PRN

## 2015-11-26 MED ORDER — POTASSIUM CHLORIDE CRYS ER 20 MEQ PO TBCR: 60.0000 meq | EXTENDED_RELEASE_TABLET | Freq: Every day | ORAL | 6 refills | Status: DC

## 2015-11-26 MED ORDER — FENTANYL CITRATE (PF) 100 MCG/2ML IJ SOLN
INTRAMUSCULAR | Status: DC | PRN
  Administered 2015-11-26: 12.5 ug via INTRAVENOUS

## 2015-11-26 SURGICAL SUPPLY — 9 items
CATH BALLN WEDGE 5F 110CM (CATHETERS) ×2 IMPLANT
PACK CARDIAC CATHETERIZATION (CUSTOM PROCEDURE TRAY) ×2 IMPLANT
PROTECTION STATION PRESSURIZED (MISCELLANEOUS) ×2
SHEATH FAST CATH BRACH 5F 5CM (SHEATH) ×2 IMPLANT
STATION PROTECTION PRESSURIZED (MISCELLANEOUS) ×1 IMPLANT
TRANSDUCER W/STOPCOCK (MISCELLANEOUS) ×4 IMPLANT
TUBING ART PRESS 72  MALE/FEM (TUBING) ×1
TUBING ART PRESS 72 MALE/FEM (TUBING) ×1 IMPLANT
TUBING CIL FLEX 10 FLL-RA (TUBING) ×2 IMPLANT

## 2015-11-26 NOTE — Progress Notes (Signed)
Please see RHC from today: preserved cardiac output but elevated PCWP with prominent v-waves.  Right atrial pressure appears normal.  RHC suggestive of significant MR, as is echocardiogram. This may have to be addressed in the future.    Today, I will let her go home. Medication regimen for home will be: Lasix 40 mg daily, KCl 60 daily, Coreg 3.125 mg bid, atorvastatin 80 daily, Xarelto 15 daily, ticagrelor 90 bid, losartan 12.5 mg daily.  She will followup in 10 days in CHF clinic.   Marca Ancona 11/26/2015

## 2015-11-26 NOTE — Progress Notes (Addendum)
Patient ID: Catherine Mays, female   DOB: 1965-07-20, 50 y.o.   MRN: [id number]    50 yo with recent anterior MI and ischemic cardiomyopathy was admitted 9/29 with nausea, fatigue, cough, poor appetite, weight loss, and concern for low output HF.    She had anterior STEMI in 7/17 with DES to LAD.  No significant disease in other vessels.  Echo post-PCI showed EF 30-35%, anterior and anteroseptal akinesis.  She was sent home with a Lifevest.  Also noted to have paroxysmal atrial fibrillation in 7/17 while in the hospital and started on amiodarone.    At home, BP ran low and she had significant fatigue along with nausea, poor appetite, weight loss.  She saw her cardiologist Dr Clifton James on Friday and was admitted with the above symptoms.    On admission, amiodarone was stopped as it was thought to contribute to symptoms.  Lisinopril was stopped due to cough. She has received IV Lasix (once daily).  HF consult requested due to concern for low output.   Denies N/V/SOB  Echo done, EF visually 25% though calculated 35%. At least moderate to possibly severe MR.    Scheduled Meds: . atorvastatin  80 mg Oral q1800  . carvedilol  3.125 mg Oral BID WC  . furosemide  20 mg Oral Daily  . levothyroxine  125 mcg Oral QAC breakfast  . losartan  12.5 mg Oral Daily  . pantoprazole  40 mg Oral Daily  . potassium chloride  60 mEq Oral Daily  . sodium chloride flush  3 mL Intravenous Q12H  . sodium chloride flush  3 mL Intravenous Q12H  . ticagrelor  90 mg Oral BID  . triamcinolone  2 spray Nasal Daily   Continuous Infusions: . sodium chloride 10 mL/hr at 11/26/15 0641   PRN Meds:.sodium chloride, sodium chloride, acetaminophen, nitroGLYCERIN, ondansetron (ZOFRAN) IV, sodium chloride flush, sodium chloride flush, traZODone   Vitals:   11/25/15 1725 11/25/15 2145 11/26/15 0556 11/26/15 0900  BP: 95/65 (!) 102/59 92/63 90/61   Pulse: 83 82 70   Resp:  18 18   Temp:  98.6 F (37 C) 98.5 F (36.9 C)     TempSrc:  Oral Oral   SpO2:  98% 95%   Weight:   146 lb 8 oz (66.5 kg)   Height:        Intake/Output Summary (Last 24 hours) at 11/26/15 0952 Last data filed at 11/25/15 1851  Gross per 24 hour  Intake              480 ml  Output                0 ml  Net              480 ml    LABS: Basic Metabolic Panel:  Recent Labs  73/41/93 1858  11/25/15 0248 11/26/15 0340  NA 136  < > 140 140  K 3.3*  < > 3.7 3.9  CL 101  < > 104 106  CO2 25  < > 28 26  GLUCOSE 136*  < > 96 99  BUN 17  < > 17 16  CREATININE 1.33*  < > 1.32* 1.24*  CALCIUM 8.6*  < > 8.6* 8.6*  MG 1.8  --   --   --   < > = values in this interval not displayed. Liver Function Tests:  Recent Labs  11/23/15 1858  AST 37  ALT 60*  ALKPHOS 82  BILITOT  1.2  PROT 6.4*  ALBUMIN 3.6   No results for input(s): LIPASE, AMYLASE in the last 72 hours. CBC:  Recent Labs  11/23/15 1858 11/25/15 0248  WBC 8.0 6.7  NEUTROABS 4.3  --   HGB 10.8* 10.3*  HCT 33.9* 32.7*  MCV 95.0 95.1  PLT 255 228   Cardiac Enzymes:  Recent Labs  11/23/15 1858 11/23/15 2307 11/24/15 0502  TROPONINI <0.03 <0.03 <0.03   BNP: Invalid input(s): POCBNP D-Dimer: No results for input(s): DDIMER in the last 72 hours. Hemoglobin A1C: No results for input(s): HGBA1C in the last 72 hours. Fasting Lipid Panel: No results for input(s): CHOL, HDL, LDLCALC, TRIG, CHOLHDL, LDLDIRECT in the last 72 hours. Thyroid Function Tests:  Recent Labs  11/23/15 1858  TSH 3.212   Anemia Panel: No results for input(s): VITAMINB12, FOLATE, FERRITIN, TIBC, IRON, RETICCTPCT in the last 72 hours.  RADIOLOGY: Dg Chest 2 View  Result Date: 11/24/2015 CLINICAL DATA:  Pt c/o nausea, dyspnea, weakness, fatigue, cough. She has been coughing for 3 weeks. She can only sleep sitting up. When she is supine, she coughs. Cardiac hx began on July 27th with a MI and includes CAD, CHF EXAM: CHEST  2 VIEW COMPARISON:  None. FINDINGS: Moderate right and  small left pleural effusions. Mild associated streaky lung base opacity consistent with atelectasis. No pulmonary edema or convincing pneumonia. No pneumothorax. Cardiac silhouette is normal in size. Normal mediastinal and hilar contours. Bony thorax is intact. IMPRESSION: 1. Moderate right and small pleural effusions with mild associated basilar atelectasis. 2. No evidence of pneumonia or pulmonary edema. Electronically Signed   By: Amie Portlandavid  Ormond M.D.   On: 11/24/2015 07:37    PHYSICAL EXAM General: NAD in the chair Neck: No JVD, no thyromegaly or thyroid nodule.  Lungs: Clear  CV: Nondisplaced PMI.  Heart regular S1/S2, no S3/S4, no murmur.  No peripheral edema.  No carotid bruit.  Normal pedal pulses.  Abdomen: Soft, nontender, no hepatosplenomegaly, no distention.  Neurologic: Alert and oriented x 3.  Psych: Normal affect. Extremities: No clubbing or cyanosis.   TELEMETRY: Reviewed telemetry pt in NSR  ASSESSMENT AND PLAN: 50 yo with recent anterior MI and ischemic cardiomyopathy was admitted 9/29 with nausea, fatigue, cough, poor appetite, weight loss, and concern for low output HF.   1. CAD: Anterior MI 7/17 with DES to LAD.  No recurrent chest pain.  Doubt ischemia as driver for this admission.  - Continue ticagrelor, would keep off ASA.  Consider switching over to Plavix eventually due to need for anticoagulation, but will defer to Dr Clifton JamesMcAlhany.  - Continue Xarelto but will decrease her chronic dose to 15 mg daily as she is also on ticagrelor post-PCI.  Holding Xarelto today for RHC tomorrow, will then need to restart.  2. Atrial fibrillation: Paroxysmal.  Has been on amiodarone and Xarelto.  - As above, holding Xarelto today for RHC today. Anticipate restarting  xarelto 15 mg daily given ticagrelor use.  - She is now off amiodarone and feels markedly better, appetite back and no nausea.  It is possible that amiodarone was driving a lot of her symptoms.  Will leave off amiodarone, follow  rhythm.  If atrial fibrillation becomes a problem, Tikosyn would be an option after amiodarone wash-out.  3. Acute on chronic systolic CHF: Ischemic cardiomyopathy. EF 30-35% on echo during last hospitalization.  She was admitted with concern for low output HF but symptoms are much better.  She did get some diuresis while  in the hospital, but stopping amiodarone (nausea/anorexia) and lisinopril (cough) helped more than anything else.  Echo this admission showed EF in the 25% range with moderate to severe MR.  - RHC today brachial access.  - Volume status stable. Continue po lasix 20 daily today. Can adjust post cath.  - Continue low dose Coreg.  - Rather than low dose lisinopril, will have her take losartan 12.5 mg daily.  BP soft. Will not titrate. Will need to follow BP, hold for SBP < 90. - EF remains low, suspect she will need ICD.  However, only 2 months post-procedure so technically will need to wait an additional month.   Amy Clegg NP-C  11/26/2015 9:52 AM   Patient seen with PA, agree with the above note.  She is symptomatically improved, now on po Lasix.  BP remains soft.  Will have RHC today.   Marca Ancona 11/26/2015 11:31 AM

## 2015-11-26 NOTE — Discharge Summary (Signed)
Advanced Heart Failure Team  Discharge Summary   Patient ID: Catherine Mays MRN: [id number], DOB/AGE: [birth year] 50 y.o. Admit date: 11/23/2015 D/C date:     11/26/2015   Primary Discharge Diagnoses:  1. CAD:  Anterior MI 7/17 with DES to LAD. 2. PAF 3. A/C Systolic Heart Failure  Hospital Course:  50 yo with recent anterior MI and ischemic cardiomyopathy was admitted 9/29 with nausea, fatigue, cough, poor appetite, weight loss, and concern for low output HF.    She had anterior STEMI in 7/17 with DES to LAD.  No significant disease in other vessels.  Echo post-PCI showed EF 30-35%, anterior and anteroseptal akinesis.  She was sent home with a Lifevest.  Also noted to have paroxysmal atrial fibrillation in 7/17 while in the hospital and started on amiodarone.    At home, BP ran low and she had significant fatigue along with nausea, poor appetite, weight loss.  She saw her cardiologist Dr Clifton James on Friday and was admitted with the above symptoms.    On admission, amiodarone was stopped as it was thought to contribute to symptoms.  Lisinopril was stopped due to cough. She has received IV Lasix (once daily).  HF consult requested due to concern for low output.  She will remain off ace and amiodarone. RHC as noted below. Plan to follow closely in the HF clinic. She will continue Life Vest for now.   1. CAD: Anterior MI 7/17 with DES to LAD.  No recurrent chest pain.  Doubt ischemia as driver for this admission.  Continue ticagrelor, would keep off ASA.  Consider switching over to Plavix eventually due to need for anticoagulation, but will defer to Dr Clifton James.  - Continue Xarelto but will decrease her chronic dose to 15 mg daily as she is also on ticagrelor post-PCI.  Restart Xarelto post cath. .  2. Atrial fibrillation: Paroxysmal.  Has been on amiodarone and Xarelto.  - As above, holding Xarelto today for RHC today. Restart xarelto 15 mg daily given ticagrelor use.  - She is now off  amiodarone and feels markedly better, appetite back and no nausea.  It is possible that amiodarone was driving a lot of her symptoms.  Will leave off amiodarone, follow rhythm.  If atrial fibrillation becomes a problem, Tikosyn would be an option after amiodarone wash-out.  3. Acute on chronic systolic CHF: Ischemic cardiomyopathy. EF 30-35% on echo during last hospitalization.  She was admitted with concern for low output HF but symptoms are much better.  She did get some diuresis while in the hospital, but stopping amiodarone (nausea/anorexia) and lisinopril (cough) helped more than anything else.  Echo this admission showed EF in the 25% range with moderate to severe MR.  - RHC completed and showed elevated PCWP with prominent v waves suggestive of MR.   - Volume status stable. Continue lasix 40 mg  daily.   - Continue low dose Coreg.  - Rather than low dose lisinopril, will have her take losartan 12.5 mg daily.  BP soft. Will not titrate. Will need to follow BP, hold for SBP < 90. - EF remains low, suspect she will need ICD.  However, only 2 months post-procedure so technically will need to wait an additional month.   Procedures:   RHC 11/26/2015 RA mean 4 RV 56/9 PA 59/17, mean 38 PCWP mean 27 with prominent v-waves to 47 mmHg Oxygen saturations: PA 67% AO 98% Cardiac Output (Fick) 5.57  Cardiac Index (Fick) 3.06 PVR 2.0 WU  Discharge Weight: 146 pounds.  Discharge Vitals: Blood pressure 105/78, pulse 74, temperature 98.4 F (36.9 C), temperature source Oral, resp. rate 16, height 5\' 9"  (1.753 m), weight 146 lb 8 oz (66.5 kg), SpO2 95 %.  Labs: Lab Results  Component Value Date   WBC 6.7 11/25/2015   HGB 10.3 (L) 11/25/2015   HCT 32.7 (L) 11/25/2015   MCV 95.1 11/25/2015   PLT 228 11/25/2015    Recent Labs Lab 11/23/15 1858  11/26/15 0340  NA 136  < > 140  K 3.3*  < > 3.9  CL 101  < > 106  CO2 25  < > 26  BUN 17  < > 16  CREATININE 1.33*  < > 1.24*  CALCIUM 8.6*  < >  8.6*  PROT 6.4*  --   --   BILITOT 1.2  --   --   ALKPHOS 82  --   --   ALT 60*  --   --   AST 37  --   --   GLUCOSE 136*  < > 99  < > = values in this interval not displayed. Lab Results  Component Value Date   CHOL 93 (L) 10/24/2015   HDL 43 (L) 10/24/2015   LDLCALC 33 10/24/2015   TRIG 86 10/24/2015   BNP (last 3 results)  Recent Labs  11/23/15 1858  BNP 669.2*    ProBNP (last 3 results) No results for input(s): PROBNP in the last 8760 hours.   Diagnostic Studies/Procedures   No results found.  Discharge Medications     Medication List    STOP taking these medications   amiodarone 200 MG tablet Commonly known as:  PACERONE   lisinopril 2.5 MG tablet Commonly known as:  PRINIVIL,ZESTRIL     TAKE these medications   atorvastatin 80 MG tablet Commonly known as:  LIPITOR Take 1 tablet (80 mg total) by mouth daily at 6 PM.   CALCIUM + D3 PO Take 1 tablet by mouth every morning.   carvedilol 3.125 MG tablet Commonly known as:  COREG Take 1 tablet (3.125 mg total) by mouth 2 (two) times daily with a meal. What changed:  medication strength  how much to take  additional instructions   furosemide 20 MG tablet Commonly known as:  LASIX Take 2 tablets (40 mg total) by mouth daily. What changed:  how much to take  when to take this   losartan 25 MG tablet Commonly known as:  COZAAR Take 0.5 tablets (12.5 mg total) by mouth daily.   NASACORT ALLERGY 24HR 55 MCG/ACT Aero nasal inhaler Generic drug:  triamcinolone Place 2 sprays into the nose daily.   nitroGLYCERIN 0.4 MG SL tablet Commonly known as:  NITROSTAT Place 1 tablet (0.4 mg total) under the tongue every 5 (five) minutes as needed for chest pain.   pantoprazole 40 MG tablet Commonly known as:  PROTONIX Take 1 tablet (40 mg total) by mouth daily.   potassium chloride SA 20 MEQ tablet Commonly known as:  K-DUR,KLOR-CON Take 3 tablets (60 mEq total) by mouth daily.   Rivaroxaban 15  MG Tabs tablet Commonly known as:  XARELTO Take 1 tablet (15 mg total) by mouth daily with supper. What changed:  medication strength  how much to take   SYNTHROID 125 MCG tablet Generic drug:  levothyroxine Take 125 mcg by mouth every morning.   ticagrelor 90 MG Tabs tablet Commonly known as:  BRILINTA Take 1 tablet (90 mg total) by  mouth 2 (two) times daily.   traZODone 50 MG tablet Commonly known as:  DESYREL Take 0.5 tablets (25 mg total) by mouth at bedtime as needed for sleep.       Disposition   The patient will be discharged in stable condition to home. Discharge Instructions    (HEART FAILURE PATIENTS) Call MD:  Anytime you have any of the following symptoms: 1) 3 pound weight gain in 24 hours or 5 pounds in 1 week 2) shortness of breath, with or without a dry hacking cough 3) swelling in the hands, feet or stomach 4) if you have to sleep on extra pillows at night in order to breathe.    Complete by:  As directed    Diet - low sodium heart healthy    Complete by:  As directed    Increase activity slowly    Complete by:  As directed    STOP any activity that causes chest pain, shortness of breath, dizziness, sweating, or exessive weakness    Complete by:  As directed      Follow-up Information    Marca Ancona, MD Follow up on 12/11/2015.   Specialty:  Cardiology Why:  10:20 Garage Code 4000 Contact information: [address] [place] [address] [phone]             Duration of Discharge Encounter: Greater than 35 minutes   Signed, Tavarion Babington NP-C  11/26/2015, 2:27 PM

## 2015-11-26 NOTE — Progress Notes (Signed)
Discharge Note:  Patient alert and oriented X 4 and in no distress.  Patient given discharge instructions regarding signs and symptoms to report, medication, diet, activity, and upcoming appointments. She verbalized understanding of all instructions.  Peripheral IV and telemetry discontinued. Patient confirmed that she had all of her personal belongings.  She was transported out via wheelchair by this RN.

## 2015-11-28 ENCOUNTER — Encounter (HOSPITAL_COMMUNITY): Payer: BLUE CROSS/BLUE SHIELD

## 2015-11-28 ENCOUNTER — Ambulatory Visit: Payer: BLUE CROSS/BLUE SHIELD | Admitting: Cardiovascular Disease

## 2015-11-30 ENCOUNTER — Encounter (HOSPITAL_COMMUNITY): Payer: BLUE CROSS/BLUE SHIELD

## 2015-11-30 ENCOUNTER — Encounter (HOSPITAL_COMMUNITY)
Admission: RE | Admit: 2015-11-30 | Discharge: 2015-11-30 | Disposition: A | Discharge status: Home / Self Care | Payer: BLUE CROSS/BLUE SHIELD | Source: Ambulatory Visit | Attending: Cardiovascular Disease | Admitting: Cardiovascular Disease

## 2015-11-30 DIAGNOSIS — Z955 Presence of coronary angioplasty implant and graft: Secondary | ICD-10-CM | POA: Insufficient documentation

## 2015-11-30 DIAGNOSIS — I213 ST elevation (STEMI) myocardial infarction of unspecified site: Secondary | ICD-10-CM | POA: Diagnosis not present

## 2015-11-30 NOTE — Progress Notes (Signed)
Daily Session Note  Patient Details  Name: Catherine Mays MRN: [id number] Date of Birth: 1965-09-16 Referring Provider:    Encounter Date: 11/30/2015  Check In:     Session Check In - 11/30/15 1618      Check-In   Location [facility]   Staff Present Rindy Kollman Angelina Pih, MS, EP, [facility], Exercise Physiologist;Debra Wynetta Emery, RN, BSN   Supervising physician immediately available to respond to emergencies See telemetry face sheet for immediately available MD   Medication changes reported     No   Fall or balance concerns reported    No   Warm-up and Cool-down Performed as group-led instruction   Resistance Training Performed No   VAD Patient? No     Pain Assessment   Currently in Pain? No/denies   Pain Score 0-No pain   Multiple Pain Sites No      Capillary Blood Glucose: No results found for this or any previous visit (from the past 24 hour(s)).   Goals Met:  Independence with exercise equipment Exercise tolerated well No report of cardiac concerns or symptoms Strength training completed today  Goals Unmet:  Not Applicable  Comments: Check out: 4:45   Dr. Kate Sable is Medical Director for [place] and [facility].

## 2015-12-03 ENCOUNTER — Encounter (HOSPITAL_COMMUNITY)
Admission: RE | Admit: 2015-12-03 | Discharge: 2015-12-03 | Disposition: A | Discharge status: Home / Self Care | Payer: BLUE CROSS/BLUE SHIELD | Source: Ambulatory Visit | Attending: Cardiovascular Disease | Admitting: Cardiovascular Disease

## 2015-12-03 ENCOUNTER — Encounter (HOSPITAL_COMMUNITY): Payer: BLUE CROSS/BLUE SHIELD

## 2015-12-03 DIAGNOSIS — I213 ST elevation (STEMI) myocardial infarction of unspecified site: Secondary | ICD-10-CM | POA: Diagnosis not present

## 2015-12-03 NOTE — Progress Notes (Signed)
Daily Session Note  Patient Details  Name: Catherine Mays MRN: [id number] Date of Birth: 17-Jan-1966 Referring Provider:    Encounter Date: 12/03/2015  Check In:     Session Check In - 12/03/15 1545      Check-In   Location [facility]   Staff Present Aundra Dubin, RN, BSN;Rasheen Bells Luther Parody, BS, EP, Exercise Physiologist   Supervising physician immediately available to respond to emergencies See telemetry face sheet for immediately available MD   Medication changes reported     No   Fall or balance concerns reported    No   Warm-up and Cool-down Performed as group-led instruction   Resistance Training Performed Yes   VAD Patient? No     Pain Assessment   Currently in Pain? No/denies   Pain Score 0-No pain   Multiple Pain Sites No      Capillary Blood Glucose: No results found for this or any previous visit (from the past 24 hour(s)).   Goals Met:  Independence with exercise equipment Exercise tolerated well No report of cardiac concerns or symptoms Strength training completed today  Goals Unmet:  Not Applicable  Comments: Check out 445   Dr. Kate Sable is Medical Director for [place] and [facility].

## 2015-12-05 ENCOUNTER — Encounter (HOSPITAL_COMMUNITY): Payer: BLUE CROSS/BLUE SHIELD

## 2015-12-05 ENCOUNTER — Encounter (HOSPITAL_COMMUNITY)
Admission: RE | Admit: 2015-12-05 | Discharge: 2015-12-05 | Disposition: A | Discharge status: Home / Self Care | Payer: BLUE CROSS/BLUE SHIELD | Source: Ambulatory Visit | Attending: Cardiovascular Disease | Admitting: Cardiovascular Disease

## 2015-12-05 DIAGNOSIS — I213 ST elevation (STEMI) myocardial infarction of unspecified site: Secondary | ICD-10-CM | POA: Diagnosis not present

## 2015-12-05 NOTE — Progress Notes (Signed)
Cardiac Individual Treatment Plan  Patient Details  Name: Catherine Mays MRN: [id number] Date of Birth: 1965-03-13 Referring Provider:    Initial Encounter Date:  [facility] PHASE II ORIENTATION from 10/22/2015 in [place]  Date  10/22/15      Visit Diagnosis: ST elevation myocardial infarction (STEMI), unspecified artery ([place])  Patient's Home Medications on Admission:  Current Outpatient Prescriptions:  .  atorvastatin (LIPITOR) 80 MG tablet, Take 1 tablet (80 mg total) by mouth daily at 6 PM., Disp: 30 tablet, Rfl: 11 .  Calcium Carb-Cholecalciferol (CALCIUM + D3 PO), Take 1 tablet by mouth every morning., Disp: , Rfl:  .  carvedilol (COREG) 3.125 MG tablet, Take 1 tablet (3.125 mg total) by mouth 2 (two) times daily with a meal., Disp: 60 tablet, Rfl: 6 .  furosemide (LASIX) 20 MG tablet, Take 2 tablets (40 mg total) by mouth daily., Disp: 30 tablet, Rfl: 4 .  losartan (COZAAR) 25 MG tablet, Take 0.5 tablets (12.5 mg total) by mouth daily., Disp: 30 tablet, Rfl: 6 .  nitroGLYCERIN (NITROSTAT) 0.4 MG SL tablet, Place 1 tablet (0.4 mg total) under the tongue every 5 (five) minutes as needed for chest pain., Disp: 25 tablet, Rfl: 3 .  pantoprazole (PROTONIX) 40 MG tablet, Take 1 tablet (40 mg total) by mouth daily., Disp: 30 tablet, Rfl: 11 .  potassium chloride SA (K-DUR,KLOR-CON) 20 MEQ tablet, Take 3 tablets (60 mEq total) by mouth daily., Disp: 90 tablet, Rfl: 6 .  Rivaroxaban (XARELTO) 15 MG TABS tablet, Take 1 tablet (15 mg total) by mouth daily with supper., Disp: 30 tablet, Rfl: 6 .  SYNTHROID 125 MCG tablet, Take 125 mcg by mouth every morning., Disp: , Rfl: 5 .  ticagrelor (BRILINTA) 90 MG TABS tablet, Take 1 tablet (90 mg total) by mouth 2 (two) times daily., Disp: 180 tablet, Rfl: 3 .  traZODone (DESYREL) 50 MG tablet, Take 0.5 tablets (25 mg total) by mouth at bedtime as needed for sleep., Disp: 30 tablet, Rfl: 0 .  triamcinolone  (NASACORT ALLERGY 24HR) 55 MCG/ACT AERO nasal inhaler, Place 2 sprays into the nose daily., Disp: , Rfl:  No current facility-administered medications for this encounter.   Facility-Administered Medications Ordered in Other Encounters:  .  bivalirudin (ANGIOMAX) 250 mg in sodium chloride 0.9 % 50 mL (5 mg/mL) infusion, , , Continuous PRN, Burnell Blanks, MD, Stopped at 09/20/15 [phone] .  bivalirudin (ANGIOMAX) BOLUS via infusion, , , PRN, Burnell Blanks, MD, 54.45 mg at 09/20/15 2620 .  fentaNYL (SUBLIMAZE) injection, , , PRN, Burnell Blanks, MD, 25 mcg at 09/20/15 0651 .  heparin 1,500 mL, , , PRN, Burnell Blanks, MD .  heparin infusion 2 units/mL in 0.9 % sodium chloride, , , Continuous PRN, Burnell Blanks, MD, 1,500 mL at 09/20/15 0733 .  iopamidol (ISOVUE-370) 76 % injection, , , PRN, Burnell Blanks, MD, 165 mL at 09/20/15 0733 .  lidocaine (PF) (XYLOCAINE) 1 % injection, , , PRN, Burnell Blanks, MD, 2 mL at 09/20/15 3559 .  midazolam (VERSED) injection, , , PRN, Burnell Blanks, MD, 1 mg at 09/20/15 0651 .  nitroGLYCERIN 1 mg/10 ml (100 mcg/ml) - IR/CATH LAB, , , PRN, Burnell Blanks, MD, 200 mcg at 09/20/15 7416 .  Radial Cocktail/Verapamil only, , , PRN, Burnell Blanks, MD, 10 mL at 09/20/15 0629 .  ticagrelor (BRILINTA) tablet, , , PRN, Burnell Blanks, MD, 180 mg at 09/20/15  0638 .  tirofiban (AGGRASTAT) bolus via infusion, , , PRN, Christopher D McAlhany, MD, 1,815 mcg at 09/20/15 0647 .  tirofiban (AGGRASTAT) infusion 50 mcg/mL 100 mL, , , Continuous PRN, Christopher D McAlhany, MD, Last Rate: 13.1 mL/hr at 09/20/15 0651, 0.15 mcg/kg/min at 09/20/15 0651  Past Medical History: Past Medical History:  Diagnosis Date  . CAD in native artery   . Chronic systolic CHF (congestive heart failure) (HCC)   . Hypotension    a. preventing med titration for HF.  . Hypothyroidism 09/24/2015  . Ischemic  cardiomyopathy   . PAF (paroxysmal atrial fibrillation) (HCC) 09/24/2015   a. dx at time of STEMI 08/2015.  . Pre-diabetes   . PVC's (premature ventricular contractions)     Tobacco Use: History  Smoking Status  . Never Smoker  Smokeless Tobacco  . Never Used    Labs: Recent Review Flowsheet Data    Labs for ITP Cardiac and Pulmonary Rehab Latest Ref Rng & Units 10/10/2015 10/24/2015 11/26/2015 11/26/2015   Cholestrol 125 - 200 mg/dL - 93(L) - -   LDLCALC <130 mg/dL - 33 - -   HDL >=46 mg/dL - 43(L) - -   Trlycerides <150 mg/dL - 86 - -   Hemoglobin A1c <5.7 % 6.2(H) - - -   HCO3 20.0 - 28.0 mmol/L - - 23.3 22.8   TCO2 0 - 100 mmol/L - - 24 24   ACIDBASEDEF 0.0 - 2.0 mmol/L - - 1.0 1.0   O2SAT % - - 68.0 66.0      Capillary Blood Glucose: No results found for: GLUCAP   Exercise Target Goals:    Exercise Program Goal: Individual exercise prescription set with THRR, safety & activity barriers. Participant demonstrates ability to understand and report RPE using BORG scale, to self-measure pulse accurately, and to acknowledge the importance of the exercise prescription.  Exercise Prescription Goal: Starting with aerobic activity 30 plus minutes a day, 3 days per week for initial exercise prescription. Provide home exercise prescription and guidelines that participant acknowledges understanding prior to discharge.  Activity Barriers & Risk Stratification:     Activity Barriers & Cardiac Risk Stratification - 10/22/15 1520      Activity Barriers & Cardiac Risk Stratification   Activity Barriers None   Cardiac Risk Stratification High      6 Minute Walk:     6 Minute Walk    Row Name 10/22/15 1422         6 Minute Walk   Phase Initial     Distance 1500 feet     Walk Time 6 minutes     # of Rest Breaks 0     MPH 2.84     METS 3.17     RPE 8     Perceived Dyspnea  7     VO2 Peak 15.73     Symptoms No     Resting HR 74 bpm     Resting BP 90/60     Max Ex.  HR 94 bpm     Max Ex. BP 106/70     2 Minute Post BP 96/66        Initial Exercise Prescription:     Initial Exercise Prescription - 10/22/15 1400      Date of Initial Exercise RX and Referring Provider   Date 10/22/15     NuStep   Level 2   Watts 15   Minutes 15   METs 1.9     Elliptical     Level 1   Speed 1.5   Minutes 20   METs 1.9     Prescription Details   Frequency (times per week) 3   Duration Progress to 30 minutes of continuous aerobic without signs/symptoms of physical distress     Intensity   THRR REST +  30   THRR 40-80% of Max Heartrate 113-132-152   Ratings of Perceived Exertion 11-13   Perceived Dyspnea 0-4     Progression   Progression Continue progressive overload as per policy without signs/symptoms or physical distress.     Resistance Training   Training Prescription Yes   Weight 1   Reps 10-12      Perform Capillary Blood Glucose checks as needed.  Exercise Prescription Changes:     Exercise Prescription Changes    Row Name 11/06/15 1500 12/04/15 0800           Exercise Review   Progression Yes Yes        Response to Exercise   Blood Pressure (Admit) 96/40 88/60      Blood Pressure (Exercise) 130/70 110/64      Blood Pressure (Exit) 108/62 86/68      Heart Rate (Admit) 81 bpm 70 bpm      Heart Rate (Exercise) 93 bpm 91 bpm      Heart Rate (Exit) 90 bpm 78 bpm      Rating of Perceived Exertion (Exercise) 8 8      Duration Progress to 30 minutes of continuous aerobic without signs/symptoms of physical distress Progress to 30 minutes of continuous aerobic without signs/symptoms of physical distress      Intensity Rest + 30 Rest + 30        Progression   Progression Continue progressive overload as per policy without signs/symptoms or physical distress. Continue progressive overload as per policy without signs/symptoms or physical distress.        Resistance Training   Training Prescription Yes Yes      Weight 2 2      Reps  10-12 10-12        Treadmill   MPH 2.2 2.9      Grade 0 0      Minutes 15 15      METs 2.68 3.2        NuStep   Level 2 2      Watts 28 20      Minutes 15 20      METs 3.45 3.43        Elliptical   Level 1 0      Speed 1.5 0      Minutes 20 0      METs 1.9 0        Home Exercise Plan   Plans to continue exercise at Home Home      Frequency Add 2 additional days to program exercise sessions. Add 2 additional days to program exercise sessions.         Exercise Comments:     Exercise Comments    Row Name 11/06/15 1543 12/04/15 0816         Exercise Comments Patient is proggressing appropriately, was switched from standing elliptical to treadmill due to tightness in chest area  Patient is progressing well. Patient was taken off of elliptical due to chest tightness           Discharge Exercise Prescription (Final Exercise Prescription Changes):     Exercise Prescription Changes - 12/04/15 0800        Exercise Review   Progression Yes     Response to Exercise   Blood Pressure (Admit) 88/60   Blood Pressure (Exercise) 110/64   Blood Pressure (Exit) 86/68   Heart Rate (Admit) 70 bpm   Heart Rate (Exercise) 91 bpm   Heart Rate (Exit) 78 bpm   Rating of Perceived Exertion (Exercise) 8   Duration Progress to 30 minutes of continuous aerobic without signs/symptoms of physical distress   Intensity Rest + 30     Progression   Progression Continue progressive overload as per policy without signs/symptoms or physical distress.     Resistance Training   Training Prescription Yes   Weight 2   Reps 10-12     Treadmill   MPH 2.9   Grade 0   Minutes 15   METs 3.2     NuStep   Level 2   Watts 20   Minutes 20   METs 3.43     Elliptical   Level 0   Speed 0   Minutes 0   METs 0     Home Exercise Plan   Plans to continue exercise at Home   Frequency Add 2 additional days to program exercise sessions.      Nutrition:  Target Goals: Understanding of  nutrition guidelines, daily intake of sodium <1559m, cholesterol <2089m calories 30% from fat and 7% or less from saturated fats, daily to have 5 or more servings of fruits and vegetables.  Biometrics:     Pre Biometrics - 10/22/15 1425      Pre Biometrics   Height 5' 9" (1.753 m)   Weight 153 lb 3.5 oz (69.5 kg)   Waist Circumference 33 inches   Hip Circumference 37.5 inches   Waist to Hip Ratio 0.88 %   BMI (Calculated) 22.7   Triceps Skinfold 12 mm   % Body Fat 20.8 %   Grip Strength 70 kg   Flexibility 15 in   Single Leg Stand 60 seconds       Nutrition Therapy Plan and Nutrition Goals:   Nutrition Discharge: Rate Your Plate Scores:     Nutrition Assessments - 10/22/15 1522      MEDFICTS Scores   Pre Score 41      Nutrition Goals Re-Evaluation:   Psychosocial: Target Goals: Acknowledge presence or absence of depression, maximize coping skills, provide positive support system. Participant is able to verbalize types and ability to use techniques and skills needed for reducing stress and depression.  Initial Review & Psychosocial Screening:     Initial Psych Review & Screening - 10/22/15 1526      Initial Review   Current issues with Current Stress Concerns;Current Sleep Concerns;Current Depression   Source of Stress Concerns Occupation     FaCustarYes     Barriers   Psychosocial barriers to participate in program There are no identifiable barriers or psychosocial needs.     Screening Interventions   Interventions Encouraged to exercise      Quality of Life Scores:     Quality of Life - 10/22/15 1426      Quality of Life Scores   Health/Function Pre 16.4 %   Socioeconomic Pre 27.07 %   Psych/Spiritual Pre 24 %   Family Pre 22.5 %   GLOBAL Pre 21.02 %      PHQ-9: Recent Review Flowsheet Data    [facility]   Decreased Interest 1   Down, Depressed,  Hopeless 1   PHQ - 2 Score 2    Altered sleeping 2   Tired, decreased energy 1   Change in appetite 0   Feeling bad or failure about yourself  1   Trouble concentrating 0   Moving slowly or fidgety/restless 0   Suicidal thoughts 0   PHQ-9 Score 6   Difficult doing work/chores Somewhat difficult      Psychosocial Evaluation and Intervention:     Psychosocial Evaluation - 10/22/15 1527      Psychosocial Evaluation & Interventions   Interventions Stress management education;Relaxation education;Encouraged to exercise with the program and follow exercise prescription   Continued Psychosocial Services Needed Yes  Patient will be monitored through out the program to ensure exercising has improved her feelings of depression.       Psychosocial Re-Evaluation:     Psychosocial Re-Evaluation    [place] Name 11/09/15 1439 12/05/15 1507           Psychosocial Re-Evaluation   Interventions Encouraged to attend Cardiac Rehabilitation for the exercise Encouraged to attend Cardiac Rehabilitation for the exercise      Comments Patient's QOL score was 21.02 and her PHQ-9 score was 6. Patient has had some depression since her cardiac event but she feels some improvement coming to program. She feels she does not need counseling.   Patient's QOL score was 21.02 and her PHQ-9 score was 6. Patient has had some depression since her cardiac event but feels she does not need counseling. Her depression has improved since starting the program.          Vocational Rehabilitation: Provide vocational rehab assistance to qualifying candidates.   Vocational Rehab Evaluation & Intervention:     Vocational Rehab - 10/22/15 1521      Initial Vocational Rehab Evaluation & Intervention   Assessment shows need for Vocational Rehabilitation No      Education: Education Goals: Education classes will be provided on a weekly basis, covering required topics. Participant will state understanding/return demonstration of topics  presented.  Learning Barriers/Preferences:     Learning Barriers/Preferences - 10/22/15 1521      Learning Barriers/Preferences   Learning Barriers None   Learning Preferences Audio;Pictoral;Skilled Demonstration;Verbal Instruction;Written Material;Video      Education Topics: Hypertension, Hypertension Reduction -Define heart disease and high blood pressure. Discus how high blood pressure affects the body and ways to reduce high blood pressure.   Exercise and Your Heart -Discuss why it is important to exercise, the FITT principles of exercise, normal and abnormal responses to exercise, and how to exercise safely.   Angina -Discuss definition of angina, causes of angina, treatment of angina, and how to decrease risk of having angina.   Cardiac Medications -Review what the following cardiac medications are used for, how they affect the body, and side effects that may occur when taking the medications.  Medications include Aspirin, Beta blockers, calcium channel blockers, ACE Inhibitors, angiotensin receptor blockers, diuretics, digoxin, and antihyperlipidemics.   Congestive Heart Failure -Discuss the definition of CHF, how to live with CHF, the signs and symptoms of CHF, and how keep track of weight and sodium intake.   Heart Disease and Intimacy -Discus the effect sexual activity has on the heart, how changes occur during intimacy as we age, and safety during sexual activity. Flowsheet Row CARDIAC REHAB PHASE II EXERCISE from 11/21/2015 in [place]  Date  10/31/15  Educator  DJ  Instruction Review Code  2- meets goals/outcomes  Smoking Cessation / COPD -Discuss different methods to quit smoking, the health benefits of quitting smoking, and the definition of COPD. Flowsheet Row CARDIAC REHAB PHASE II EXERCISE from 11/21/2015 in [place]  Date  11/07/15  Educator  DJ  Instruction Review Code  2- meets goals/outcomes       Nutrition I: Fats -Discuss the types of cholesterol, what cholesterol does to the heart, and how cholesterol levels can be controlled.   Nutrition II: Labels -Discuss the different components of food labels and how to read food label [place] from 11/21/2015 in [place]  Date  11/21/15  Educator  Russella Dar  Instruction Review Code  2- meets goals/outcomes      Heart Parts and Heart Disease -Discuss the anatomy of the heart, the pathway of blood circulation through the heart, and these are affected by heart disease.   Stress I: Signs and Symptoms -Discuss the causes of stress, how stress may lead to anxiety and depression, and ways to limit stress.   Stress II: Relaxation -Discuss different types of relaxation techniques to limit stress.   Warning Signs of Stroke / TIA -Discuss definition of a stroke, what the signs and symptoms are of a stroke, and how to identify when someone is having stroke.   Knowledge Questionnaire Score:     Knowledge Questionnaire Score - 10/22/15 1521      Knowledge Questionnaire Score   Pre Score 23/24      Core Components/Risk Factors/Patient Goals at Admission:     Personal Goals and Risk Factors at Admission - 10/22/15 1522      Core Components/Risk Factors/Patient Goals on Admission    Weight Management Weight Maintenance   Increase Strength and Stamina Yes   Intervention Provide advice, education, support and counseling about physical activity/exercise needs.;Develop an individualized exercise prescription for aerobic and resistive training based on initial evaluation findings, risk stratification, comorbidities and participant's personal goals.   Expected Outcomes Achievement of increased cardiorespiratory fitness and enhanced flexibility, muscular endurance and strength shown through measurements of functional capacity and personal statement of participant.   Diabetes  Yes   Intervention Provide education about signs/symptoms and action to take for hypo/hyperglycemia.   Expected Outcomes Long Term: Attainment of HbA1C < 7%.   Stress Yes   Intervention Offer individual and/or small group education and counseling on adjustment to heart disease, stress management and health-related lifestyle change. Teach and support self-help strategies.   Expected Outcomes Short Term: Participant demonstrates changes in health-related behavior, relaxation and other stress management skills, ability to obtain effective social support, and compliance with psychotropic medications if prescribed.;Long Term: Emotional wellbeing is indicated by absence of clinically significant psychosocial distress or social isolation.   Personal Goal Other Yes   Personal Goal Patient wants to increase her strenght and stamina. She also want to make regular exercise a part of her lifestyle.     Intervention Patient will exercise 3 days/week in program and supplement with 2 days/week at home.    Expected Outcomes Patient will meet her above stated goals.       Core Components/Risk Factors/Patient Goals Review:      Goals and Risk Factor Review    Row Name 10/22/15 1526 11/09/15 1437 12/05/15 1501         Core Components/Risk Factors/Patient Goals Review   Personal Goals Review Increase Strength and Stamina;Weight Management/Obesity;Stress Weight Management/Obesity;Increase Strength and Stamina;Other  Patient wants to increase her  strenght and stamina. She also want to make regular exercise a part of her lifestyle.  Increase Strength and Stamina;Other  Patient wants exercise to be a regular part of her lifestyle.      Review  - After 5 sessions, patient has gained 0.5 lbs. She is doing well in the program. She says she feels better and has seen a slight improvement in her strength.  After 11 sessions, patient has lost 5 lbs. She is doing well in the program. Her strength and stamina are improved.  She was hospitalized for a few days with vomiting and general malaise. She says her cardiologist discussed a internal defrillator placement. She has also missed some sessions with a URI. Her b/p continues to be low. Her PCP is adjusting her medications.      Expected Outcomes  - Patient will continue to attend sessions maintaining her weight with improved strength and stamina.  Patient will continue to attend sessions and complete the program meeting the above stated goals.         Core Components/Risk Factors/Patient Goals at Discharge (Final Review):      Goals and Risk Factor Review - 12/05/15 1501      Core Components/Risk Factors/Patient Goals Review   Personal Goals Review Increase Strength and Stamina;Other  Patient wants exercise to be a regular part of her lifestyle.    Review After 11 sessions, patient has lost 5 lbs. She is doing well in the program. Her strength and stamina are improved. She was hospitalized for a few days with vomiting and general malaise. She says her cardiologist discussed a internal defrillator placement. She has also missed some sessions with a URI. Her b/p continues to be low. Her PCP is adjusting her medications.    Expected Outcomes Patient will continue to attend sessions and complete the program meeting the above stated goals.       ITP Comments:     ITP Comments    Row Name 11/01/15 0900           ITP Comments Patient met with Registered Dietitian to discuss nutrition topics including: Heart healthty eating, heart healthy cooking and making smart choices when shopping; Portion control; weight management; and hydration. He also attended a class with hospital chaplian called [facility] to discuss how his diagnosis has impacted his life.          Comments: Patient doing well with program. Will continue to monitor for progress.

## 2015-12-05 NOTE — Progress Notes (Signed)
Daily Session Note  Patient Details  Name: Catherine Mays MRN: [id number] Date of Birth: Jul 20, 1965 Referring Provider:    Encounter Date: 12/05/2015  Check In:     Session Check In - 12/05/15 1545      Check-In   Location [facility]   Staff Present Diane Angelina Pih, MS, EP, [facility], Exercise Physiologist;Kaisyn Reinhold Wynetta Emery, RN, BSN   Supervising physician immediately available to respond to emergencies See telemetry face sheet for immediately available MD   Fall or balance concerns reported    No   Warm-up and Cool-down Performed as group-led instruction   Resistance Training Performed Yes   VAD Patient? No     Pain Assessment   Currently in Pain? No/denies   Pain Score 0-No pain   Multiple Pain Sites No      Capillary Blood Glucose: No results found for this or any previous visit (from the past 24 hour(s)).   Goals Met:  Independence with exercise equipment Exercise tolerated well No report of cardiac concerns or symptoms Strength training completed today  Goals Unmet:  Not Applicable  Comments: Check out [id number].   Dr. Kate Sable is Medical Director for [facility] Cardiac and Pulmonary Rehab.

## 2015-12-06 ENCOUNTER — Telehealth (HOSPITAL_COMMUNITY): Payer: Self-pay | Admitting: *Deleted

## 2015-12-06 NOTE — Telephone Encounter (Signed)
-----   Message from [name], MD sent at 12/05/2015  4:48 PM EDT ----- Regarding: FW: Low b/p Have Mrs Marotz hold losartan until followup.  ----- Message ----- From: Suann Larry, RN Sent: 12/05/2015   4:22 PM To: [name], MD, # Subject: Low b/p                                        Patient's initial b/p readings for cardiac rehab have been consistently low: Today it was 80/60, 10/9 it was 88/60 at start. During exercise it was 110/64, post exercise 86/68.  Her PCP, Dr. Margo Aye advised her to not take Lorsartan if the systolic was below 90.   Please advise or call patient.  Thanks!!  Jae Dire, RN Cardiac Rehab/[place]

## 2015-12-06 NOTE — Telephone Encounter (Signed)
Patient aware to hold losartan until follow up appt.

## 2015-12-07 ENCOUNTER — Encounter (HOSPITAL_COMMUNITY)
Admission: RE | Admit: 2015-12-07 | Discharge: 2015-12-07 | Disposition: A | Discharge status: Home / Self Care | Payer: BLUE CROSS/BLUE SHIELD | Source: Ambulatory Visit | Attending: Cardiovascular Disease | Admitting: Cardiovascular Disease

## 2015-12-07 ENCOUNTER — Encounter (HOSPITAL_COMMUNITY): Payer: BLUE CROSS/BLUE SHIELD

## 2015-12-07 DIAGNOSIS — I213 ST elevation (STEMI) myocardial infarction of unspecified site: Secondary | ICD-10-CM | POA: Diagnosis not present

## 2015-12-07 DIAGNOSIS — Z955 Presence of coronary angioplasty implant and graft: Secondary | ICD-10-CM

## 2015-12-07 NOTE — Progress Notes (Signed)
Daily Session Note  Patient Details  Name: Catherine Mays MRN: [id number] Date of Birth: [birth year] Referring Provider:    Encounter Date: 12/07/2015  Check In:     Session Check In - 12/07/15 1545      Check-In   Location [facility]   Staff Present Russella Dar, MS, EP, [facility], Exercise Physiologist;Debra Wynetta Emery, RN, BSN   Supervising physician immediately available to respond to emergencies See telemetry face sheet for immediately available MD   Medication changes reported     Yes   Comments Dr. Aundra Dubin stopped her Lorsartan her f/u visit on Tuesday 12/11/15.    Fall or balance concerns reported    No   Warm-up and Cool-down Performed as group-led instruction   Resistance Training Performed Yes   VAD Patient? No     Pain Assessment   Currently in Pain? No/denies   Pain Score 0-No pain   Multiple Pain Sites No      Capillary Blood Glucose: No results found for this or any previous visit (from the past 24 hour(s)).   Goals Met:  Independence with exercise equipment Exercise tolerated well No report of cardiac concerns or symptoms Strength training completed today  Goals Unmet:  Not Applicable  Comments: Check out 445   Dr. Kate Sable is Medical Director for [place] and Pulmonary Rehab.

## 2015-12-10 ENCOUNTER — Encounter (HOSPITAL_COMMUNITY)
Admission: RE | Admit: 2015-12-10 | Discharge: 2015-12-10 | Disposition: A | Discharge status: Home / Self Care | Payer: BLUE CROSS/BLUE SHIELD | Source: Ambulatory Visit | Attending: Cardiovascular Disease | Admitting: Cardiovascular Disease

## 2015-12-10 ENCOUNTER — Encounter (HOSPITAL_COMMUNITY): Payer: BLUE CROSS/BLUE SHIELD

## 2015-12-10 DIAGNOSIS — I213 ST elevation (STEMI) myocardial infarction of unspecified site: Secondary | ICD-10-CM | POA: Diagnosis not present

## 2015-12-10 NOTE — Progress Notes (Signed)
Daily Session Note  Patient Details  Name: Catherine Mays MRN: [id number] Date of Birth: [birth year] Referring Provider:    Encounter Date: 12/10/2015  Check In:     Session Check In - 12/10/15 1545      Check-In   Location [facility]   Staff Present Aundra Dubin, RN, BSN;Latarra Eagleton Luther Parody, BS, EP, Exercise Physiologist   Supervising physician immediately available to respond to emergencies See telemetry face sheet for immediately available MD   Medication changes reported     No   Fall or balance concerns reported    No   Warm-up and Cool-down Performed as group-led instruction   Resistance Training Performed Yes   VAD Patient? No     Pain Assessment   Currently in Pain? No/denies   Pain Score 0-No pain   Multiple Pain Sites No      Capillary Blood Glucose: No results found for this or any previous visit (from the past 24 hour(s)).   Goals Met:  Independence with exercise equipment Exercise tolerated well No report of cardiac concerns or symptoms Strength training completed today  Goals Unmet:  Not Applicable  Comments: Check out 445   Dr. Kate Sable is Medical Director for [place] and Pulmonary Rehab.

## 2015-12-11 ENCOUNTER — Ambulatory Visit (HOSPITAL_COMMUNITY)
Admit: 2015-12-11 | Discharge: 2015-12-11 | Disposition: A | Discharge status: Home / Self Care | Payer: BLUE CROSS/BLUE SHIELD | Source: Ambulatory Visit | Attending: Cardiology | Admitting: Cardiology

## 2015-12-11 ENCOUNTER — Encounter (HOSPITAL_COMMUNITY): Payer: Self-pay

## 2015-12-11 VITALS — BP 94/68 | HR 68 | Wt 148.5 lb

## 2015-12-11 DIAGNOSIS — I5022 Chronic systolic (congestive) heart failure: Secondary | ICD-10-CM | POA: Insufficient documentation

## 2015-12-11 DIAGNOSIS — Z7901 Long term (current) use of anticoagulants: Secondary | ICD-10-CM | POA: Insufficient documentation

## 2015-12-11 DIAGNOSIS — Z79899 Other long term (current) drug therapy: Secondary | ICD-10-CM | POA: Insufficient documentation

## 2015-12-11 DIAGNOSIS — E785 Hyperlipidemia, unspecified: Secondary | ICD-10-CM | POA: Diagnosis not present

## 2015-12-11 DIAGNOSIS — I2510 Atherosclerotic heart disease of native coronary artery without angina pectoris: Secondary | ICD-10-CM | POA: Diagnosis not present

## 2015-12-11 DIAGNOSIS — I255 Ischemic cardiomyopathy: Secondary | ICD-10-CM | POA: Diagnosis not present

## 2015-12-11 DIAGNOSIS — I480 Paroxysmal atrial fibrillation: Secondary | ICD-10-CM

## 2015-12-11 DIAGNOSIS — Z7902 Long term (current) use of antithrombotics/antiplatelets: Secondary | ICD-10-CM | POA: Insufficient documentation

## 2015-12-11 DIAGNOSIS — I340 Nonrheumatic mitral (valve) insufficiency: Secondary | ICD-10-CM | POA: Insufficient documentation

## 2015-12-11 DIAGNOSIS — Z955 Presence of coronary angioplasty implant and graft: Secondary | ICD-10-CM | POA: Insufficient documentation

## 2015-12-11 DIAGNOSIS — I252 Old myocardial infarction: Secondary | ICD-10-CM | POA: Diagnosis not present

## 2015-12-11 DIAGNOSIS — Z8249 Family history of ischemic heart disease and other diseases of the circulatory system: Secondary | ICD-10-CM | POA: Insufficient documentation

## 2015-12-11 LAB — BASIC METABOLIC PANEL WITH GFR
Anion gap: 9 (ref 5–15)
BUN: 19 mg/dL (ref 6–20)
CO2: 27 mmol/L (ref 22–32)
Calcium: 8.8 mg/dL — ABNORMAL LOW (ref 8.9–10.3)
Chloride: 103 mmol/L (ref 101–111)
Creatinine, Ser: 1.42 mg/dL — ABNORMAL HIGH (ref 0.44–1.00)
GFR calc Af Amer: 49 mL/min — ABNORMAL LOW
GFR calc non Af Amer: 43 mL/min — ABNORMAL LOW
Glucose, Bld: 144 mg/dL — ABNORMAL HIGH (ref 65–99)
Potassium: 3.8 mmol/L (ref 3.5–5.1)
Sodium: 139 mmol/L (ref 135–145)

## 2015-12-11 LAB — CBC
HCT: 32.2 % — ABNORMAL LOW (ref 36.0–46.0)
Hemoglobin: 10.5 g/dL — ABNORMAL LOW (ref 12.0–15.0)
MCH: 30.3 pg (ref 26.0–34.0)
MCHC: 32.6 g/dL (ref 30.0–36.0)
MCV: 93.1 fL (ref 78.0–100.0)
Platelets: 193 K/uL (ref 150–400)
RBC: 3.46 MIL/uL — ABNORMAL LOW (ref 3.87–5.11)
RDW: 14.2 % (ref 11.5–15.5)
WBC: 5.9 K/uL (ref 4.0–10.5)

## 2015-12-11 LAB — BRAIN NATRIURETIC PEPTIDE: B Natriuretic Peptide: 417.8 pg/mL — ABNORMAL HIGH (ref 0.0–100.0)

## 2015-12-11 MED ORDER — LOSARTAN POTASSIUM 25 MG PO TABS: 12.5000 mg | ORAL_TABLET | Freq: Every day | ORAL | 3 refills | Status: DC

## 2015-12-11 NOTE — Progress Notes (Signed)
PCP: Dr. Margo AyeHall Cardiology: Dr. Clifton JamesMcAlhany HF Cardiology: Dr. Shirlee LatchMcLean  50 yo with history of CAD s/p anterior STEMI with DES to LAD, ischemic cardiomyopathy, mitral regurgitation, and paroxysmal atrial fibrillation presents for CHF clinic evaluation.  She was admitted in 7/17 with anterior STEMI.  She had a late presentation to the cath lab.  Most recent echo in 10/17 showed EF about 25% with LAD-territory wall motion abnormalities.  She had peri-MI atrial fibrillation and was started on Xarelto.  She was put on amiodarone.   After discharge, she continued to feel poorly.  She developed nausea, anorexia, and significant fatigue.  She was admitted for evaluation in 10/17 given concern for low output heart failure.  However, stopping amiodarone essentially resolved her symptoms.  She had RHC showing preserved cardiac output but the PCWP was high due to prominent v-waves, presumably from significant mitral regurgitation.  Of note, she was in atrial fibrillation for a time during this admission.   She is now at home.  She is doing cardiac re[facility] at [facility].  SBP as low as 80s at times, and losartan stopped.  She has had no lightheadedness or syncope.  No exertional dyspnea. No chest pain.  Feels sluggish at times.  She has not noted further palpitations and is in NSR today.   ECG: NSR, old anterior MI, left axis deviation.  Labs (10/17): K 3.9, creatinine 1.24, hgb 10.3  PMH:  1. CAD: Anterior STEMI in 7/17 with late presentation to the cath lab. She had DES to proximal LAD.  No significant disease in other vessels.  2. Chronic systolic CHF: Ischemic cardiomyopathy.  - Echo 10/17 with EF 25%, akinesis of the mid-apical anteroseptal and anterior walls, apical dyskinesis, moderate to severe MR with incomplete coaptation.  - RHC (10/17): mean RA 4, PA 59/17 mean 38, mean PCWP 27 with prominent V waves suggesting significant MR, CI 3.06, PVR 2 WU.  - ACEI cough.  3. Atrial fibrillation: Paroxysmal.  Noted  7/17 admission peri-MI, also noted again 10/17 admission. She did not tolerate amiodarone due to nausea/anorexia.  4. Mitral regurgitation: Moderate to severe on 10/17 echo, incomplete coaptation.  ?Etiology, her MI (anterior) does not typically lead to ischemic MR.   5. Hyperlipidemia  SH: Married, lives in [place], works for the [employer], no smoking or ETOH.   Family History  Problem Relation Age of Onset  . Heart attack Father   . Arrhythmia Mother    ROS: All systems reviewed and negative except as per HPI.   Current Outpatient Prescriptions  Medication Sig Dispense Refill  . atorvastatin (LIPITOR) 80 MG tablet Take 1 tablet (80 mg total) by mouth daily at 6 PM. 30 tablet 11  . Calcium Carb-Cholecalciferol (CALCIUM + D3 PO) Take 1 tablet by mouth every morning.    . carvedilol (COREG) 3.125 MG tablet Take 1 tablet (3.125 mg total) by mouth 2 (two) times daily with a meal. 60 tablet 6  . furosemide (LASIX) 20 MG tablet Take 2 tablets (40 mg total) by mouth daily. 30 tablet 4  . pantoprazole (PROTONIX) 40 MG tablet Take 1 tablet (40 mg total) by mouth daily. 30 tablet 11  . potassium chloride SA (K-DUR,KLOR-CON) 20 MEQ tablet Take 3 tablets (60 mEq total) by mouth daily. 90 tablet 6  . Rivaroxaban (XARELTO) 15 MG TABS tablet Take 1 tablet (15 mg total) by mouth daily with supper. 30 tablet 6  . SYNTHROID 125 MCG tablet Take 125 mcg by mouth every morning.  5  . ticagrelor (BRILINTA) 90 MG TABS tablet Take 1 tablet (90 mg total) by mouth 2 (two) times daily. 180 tablet 3  . traZODone (DESYREL) 50 MG tablet Take 0.5 tablets (25 mg total) by mouth at bedtime as needed for sleep. 30 tablet 0  . triamcinolone (NASACORT ALLERGY 24HR) 55 MCG/ACT AERO nasal inhaler Place 2 sprays into the nose daily.    Marland Kitchen losartan (COZAAR) 25 MG tablet Take 0.5 tablets (12.5 mg total) by mouth at bedtime. 15 tablet 3  . nitroGLYCERIN (NITROSTAT) 0.4 MG SL tablet Place 1 tablet (0.4 mg total) under the tongue every 5  (five) minutes as needed for chest pain. (Patient not taking: Reported on 12/11/2015) 25 tablet 3    BP 94/68   Pulse 68   Wt 148 lb 8 oz (67.4 kg)   SpO2 100%   BMI 21.93 kg/m  General: NAD Neck: No JVD, no thyromegaly or thyroid nodule.  Lungs: Clear to auscultation bilaterally with normal respiratory effort. CV: Nondisplaced PMI.  Heart regular S1/S2, no S3/S4, 2/6 HSM at apex.  No peripheral edema.  No carotid bruit.  Normal pedal pulses.  Abdomen: Soft, nontender, no hepatosplenomegaly, no distention.  Skin: Intact without lesions or rashes.  Neurologic: Alert and oriented x 3.  Psych: Normal affect. Extremities: No clubbing or cyanosis.  HEENT: Normal.   Assessment/Plan: 1. CAD: No ischemic-type chest pain.  S/p anterior MI in 7/17 with DES to RCA.   - Continue Xarelto 15 mg daily and ticagrelor 90 mg bid.  No aspirin given Xarelto use.  - Continue cardiac rehab.  2. Chronic systolic CHF: Ischemic cardiomyopathy.  EF 25% on echo in 10/17. She has extensive LAD-territory scar.  I suspect that she will end up needing an ICD.  She is not volume overloaded on exam. NYHA class II symptoms.  BP runs low.  - Continue Coreg 3.125 mg bid.  - Add back losartan at 12.5 to be taken in the evening.   - Will add spironolactone at next visit.  - BMET/BNP today and BMET in 10 days.  - Echo in 1 month to reassess EF for ICD (needs 3 months post-MI).  3. Mitral regurgitation: Moderate to severe on 10/17 echo.  Cause is difficult to ascertain.  LAD infarct would be an unusual cause of ischemic MR.  Need to follow over time.  4. Atrial fibrillation: Unable to tolerate amiodarone due to GI side effects.  She is in NSR today on Xarelto 15 mg daily. If she has symptomatic recurrence, can consider Tikosyn.   She will see Dr Clifton James in November, I will see her again in December.   Marca Ancona 12/11/2015

## 2015-12-11 NOTE — Patient Instructions (Signed)
Start Losartan 12.5 mg (1/2 tab) daily at bedtime  Labs today  Your physician recommends that you schedule a follow-up appointment in: 2 months

## 2015-12-12 ENCOUNTER — Encounter (HOSPITAL_COMMUNITY)
Admission: RE | Admit: 2015-12-12 | Discharge: 2015-12-12 | Disposition: A | Discharge status: Home / Self Care | Payer: BLUE CROSS/BLUE SHIELD | Source: Ambulatory Visit | Attending: Cardiovascular Disease | Admitting: Cardiovascular Disease

## 2015-12-12 ENCOUNTER — Encounter (HOSPITAL_COMMUNITY): Payer: BLUE CROSS/BLUE SHIELD

## 2015-12-12 ENCOUNTER — Other Ambulatory Visit (HOSPITAL_COMMUNITY): Payer: Self-pay | Admitting: *Deleted

## 2015-12-12 DIAGNOSIS — I213 ST elevation (STEMI) myocardial infarction of unspecified site: Secondary | ICD-10-CM

## 2015-12-12 DIAGNOSIS — Z955 Presence of coronary angioplasty implant and graft: Secondary | ICD-10-CM

## 2015-12-12 MED ORDER — FUROSEMIDE 40 MG PO TABS: 40.0000 mg | ORAL_TABLET | Freq: Every day | ORAL | 6 refills | Status: DC

## 2015-12-12 NOTE — Progress Notes (Signed)
Daily Session Note  Patient Details  Name: Catherine Mays MRN: [id number] Date of Birth: 1965-08-07 Referring Provider:    Encounter Date: 12/12/2015  Check In:     Session Check In - 12/12/15 1545      Check-In   Location [facility]   Staff Present Aundra Dubin, RN, BSN;Elvyn Krohn, MS, EP, [facility], Exercise Physiologist   Supervising physician immediately available to respond to emergencies See telemetry face sheet for immediately available MD   Medication changes reported     Yes   Comments Dr. Aundra Dubin started patient back on [place] at bedtime.    Fall or balance concerns reported    No   Warm-up and Cool-down Performed as group-led instruction   Resistance Training Performed Yes   VAD Patient? No     Pain Assessment   Currently in Pain? No/denies   Pain Score 0-No pain   Multiple Pain Sites No      Capillary Blood Glucose: No results found for this or any previous visit (from the past 24 hour(s)).   Goals Met:  Independence with exercise equipment Exercise tolerated well No report of cardiac concerns or symptoms Strength training completed today  Goals Unmet:  Not Applicable  Comments: Check out: [place]   Dr. Kate Sable is Medical Director for [place] and Pulmonary Rehab.

## 2015-12-14 ENCOUNTER — Encounter (HOSPITAL_COMMUNITY)
Admission: RE | Admit: 2015-12-14 | Discharge: 2015-12-14 | Disposition: A | Discharge status: Home / Self Care | Payer: BLUE CROSS/BLUE SHIELD | Source: Ambulatory Visit | Attending: Cardiovascular Disease | Admitting: Cardiovascular Disease

## 2015-12-14 ENCOUNTER — Encounter (HOSPITAL_COMMUNITY): Payer: BLUE CROSS/BLUE SHIELD

## 2015-12-14 DIAGNOSIS — I213 ST elevation (STEMI) myocardial infarction of unspecified site: Secondary | ICD-10-CM

## 2015-12-14 DIAGNOSIS — Z955 Presence of coronary angioplasty implant and graft: Secondary | ICD-10-CM

## 2015-12-14 NOTE — Progress Notes (Signed)
Daily Session Note  Patient Details  Name: Paulette Rockford MRN: [id number] Date of Birth: 1965/09/04 Referring Provider:    Encounter Date: 12/14/2015  Check In:     Session Check In - 12/14/15 1545      Check-In   Location [facility]   Staff Present Hong Timm Angelina Pih, MS, EP, [facility], Exercise Physiologist;Debra Wynetta Emery, RN, BSN   Supervising physician immediately available to respond to emergencies See telemetry face sheet for immediately available MD   Medication changes reported     Yes   Fall or balance concerns reported    No   Warm-up and Cool-down Performed as group-led instruction   Resistance Training Performed Yes   VAD Patient? No     Pain Assessment   Currently in Pain? No/denies   Pain Score 0-No pain   Multiple Pain Sites No      Capillary Blood Glucose: No results found for this or any previous visit (from the past 24 hour(s)).   Goals Met:  Independence with exercise equipment Exercise tolerated well No report of cardiac concerns or symptoms Strength training completed today  Goals Unmet:  Not Applicable  Comments: Check in: 4:45   Dr. Kate Sable is Medical Director for [place] and Pulmonary Rehab.

## 2015-12-17 ENCOUNTER — Encounter (HOSPITAL_COMMUNITY)
Admission: RE | Admit: 2015-12-17 | Discharge: 2015-12-17 | Disposition: A | Discharge status: Home / Self Care | Payer: BLUE CROSS/BLUE SHIELD | Source: Ambulatory Visit | Attending: Cardiovascular Disease | Admitting: Cardiovascular Disease

## 2015-12-17 ENCOUNTER — Encounter (HOSPITAL_COMMUNITY): Payer: BLUE CROSS/BLUE SHIELD

## 2015-12-17 DIAGNOSIS — I213 ST elevation (STEMI) myocardial infarction of unspecified site: Secondary | ICD-10-CM | POA: Diagnosis not present

## 2015-12-17 NOTE — Progress Notes (Signed)
Daily Session Note  Patient Details  Name: Catherine Mays MRN: [id number] Date of Birth: 1965-11-25 Referring Provider:    Encounter Date: 12/17/2015  Check In:     Session Check In - 12/17/15 1545      Check-In   Location [facility]   Staff Present Suzanne Boron, BS, EP, Exercise Physiologist;Quinn Quam Wynetta Emery, RN, BSN   Supervising physician immediately available to respond to emergencies See telemetry face sheet for immediately available MD   Medication changes reported     No   Fall or balance concerns reported    No   Warm-up and Cool-down Performed as group-led instruction   Resistance Training Performed Yes   VAD Patient? No     Pain Assessment   Currently in Pain? No/denies   Pain Score 0-No pain   Multiple Pain Sites No      Capillary Blood Glucose: No results found for this or any previous visit (from the past 24 hour(s)).   Goals Met:  Independence with exercise equipment Exercise tolerated well No report of cardiac concerns or symptoms Strength training completed today  Goals Unmet:  Not Applicable  Comments: Check out 1645.   Dr. Kate Sable is Medical Director for [facility] Cardiac and Pulmonary Rehab.

## 2015-12-19 ENCOUNTER — Encounter (HOSPITAL_COMMUNITY): Payer: BLUE CROSS/BLUE SHIELD

## 2015-12-19 ENCOUNTER — Encounter (HOSPITAL_COMMUNITY)
Admission: RE | Admit: 2015-12-19 | Discharge: 2015-12-19 | Disposition: A | Discharge status: Home / Self Care | Payer: BLUE CROSS/BLUE SHIELD | Source: Ambulatory Visit | Attending: Cardiovascular Disease | Admitting: Cardiovascular Disease

## 2015-12-19 DIAGNOSIS — I213 ST elevation (STEMI) myocardial infarction of unspecified site: Secondary | ICD-10-CM

## 2015-12-19 DIAGNOSIS — Z955 Presence of coronary angioplasty implant and graft: Secondary | ICD-10-CM

## 2015-12-19 NOTE — Progress Notes (Signed)
Daily Session Note  Patient Details  Name: Catherine Mays MRN: [id number] Date of Birth: [birth year] Referring Provider:    Encounter Date: 12/19/2015  Check In:     Session Check In - 12/19/15 1620      Check-In   Location [facility]   Staff Present Avion Patella Angelina Pih, MS, EP, [facility], Exercise Physiologist;Debra Wynetta Emery, RN, BSN   Supervising physician immediately available to respond to emergencies See telemetry face sheet for immediately available MD   Medication changes reported     No   Fall or balance concerns reported    No   Warm-up and Cool-down Performed as group-led instruction   Resistance Training Performed Yes   VAD Patient? No     Pain Assessment   Currently in Pain? No/denies   Pain Score 0-No pain   Multiple Pain Sites No      Capillary Blood Glucose: No results found for this or any previous visit (from the past 24 hour(s)).   Goals Met:  Independence with exercise equipment Exercise tolerated well No report of cardiac concerns or symptoms Strength training completed today  Goals Unmet:  Not Applicable  Comments: Check out: 4:45   Dr. Kate Sable is Medical Director for [place] and Pulmonary Rehab.

## 2015-12-21 ENCOUNTER — Encounter (HOSPITAL_COMMUNITY)
Admission: RE | Admit: 2015-12-21 | Discharge: 2015-12-21 | Disposition: A | Discharge status: Home / Self Care | Payer: BLUE CROSS/BLUE SHIELD | Source: Ambulatory Visit | Attending: Cardiovascular Disease | Admitting: Cardiovascular Disease

## 2015-12-21 ENCOUNTER — Encounter (HOSPITAL_COMMUNITY): Payer: BLUE CROSS/BLUE SHIELD

## 2015-12-21 DIAGNOSIS — I213 ST elevation (STEMI) myocardial infarction of unspecified site: Secondary | ICD-10-CM | POA: Diagnosis not present

## 2015-12-21 NOTE — Progress Notes (Signed)
Daily Session Note  Patient Details  Name: Catherine Mays MRN: [id number] Date of Birth: 06/22/1965 Referring Provider:    Encounter Date: 12/21/2015  Check In:     Session Check In - 12/21/15 1100      Check-In   Location [facility]   Staff Present Diane Coad, MS, EP, [facility], Exercise Physiologist;Debra Johnson, RN, BSN   Supervising physician immediately available to respond to emergencies See telemetry face sheet for immediately available MD   Medication changes reported     No   Fall or balance concerns reported    No   Warm-up and Cool-down Performed as group-led instruction   Resistance Training Performed Yes   VAD Patient? No     Pain Assessment   Currently in Pain? No/denies   Pain Score 0-No pain   Multiple Pain Sites No      Capillary Blood Glucose: No results found for this or any previous visit (from the past 24 hour(s)).   Goals Met:  Independence with exercise equipment Exercise tolerated well No report of cardiac concerns or symptoms Strength training completed today  Goals Unmet:  Not Applicable  Comments: Check out 1645.   Dr. Suresh Koneswaran is Medical Director for [place] Cardiac and Pulmonary Rehab. 

## 2015-12-24 ENCOUNTER — Encounter (HOSPITAL_COMMUNITY)
Admission: RE | Admit: 2015-12-24 | Discharge: 2015-12-24 | Disposition: A | Discharge status: Home / Self Care | Payer: BLUE CROSS/BLUE SHIELD | Source: Ambulatory Visit | Attending: Cardiovascular Disease | Admitting: Cardiovascular Disease

## 2015-12-24 ENCOUNTER — Encounter (HOSPITAL_COMMUNITY): Payer: BLUE CROSS/BLUE SHIELD

## 2015-12-24 DIAGNOSIS — I213 ST elevation (STEMI) myocardial infarction of unspecified site: Secondary | ICD-10-CM | POA: Diagnosis not present

## 2015-12-26 ENCOUNTER — Encounter (HOSPITAL_COMMUNITY)
Admission: RE | Admit: 2015-12-26 | Discharge: 2015-12-26 | Disposition: A | Discharge status: Home / Self Care | Payer: BLUE CROSS/BLUE SHIELD | Source: Ambulatory Visit | Attending: Cardiovascular Disease | Admitting: Cardiovascular Disease

## 2015-12-26 ENCOUNTER — Encounter (HOSPITAL_COMMUNITY): Payer: BLUE CROSS/BLUE SHIELD

## 2015-12-26 DIAGNOSIS — I213 ST elevation (STEMI) myocardial infarction of unspecified site: Secondary | ICD-10-CM | POA: Insufficient documentation

## 2015-12-26 DIAGNOSIS — Z955 Presence of coronary angioplasty implant and graft: Secondary | ICD-10-CM | POA: Insufficient documentation

## 2015-12-26 NOTE — Progress Notes (Signed)
Daily Session Note  Patient Details  Name: Liddy Deam MRN: [id number] Date of Birth: 11/28/1965 Referring Provider:    Encounter Date: 12/26/2015  Check In:     Session Check In - 12/26/15 1545      Check-In   Location [facility]   Staff Present Aundra Dubin, RN, BSN;Diane Coad, MS, EP, [facility], Exercise Physiologist   Supervising physician immediately available to respond to emergencies See telemetry face sheet for immediately available MD   Medication changes reported     No   Fall or balance concerns reported    No   Warm-up and Cool-down Performed as group-led instruction   Resistance Training Performed Yes   VAD Patient? No     Pain Assessment   Currently in Pain? No/denies   Pain Score 0-No pain   Multiple Pain Sites No      Capillary Blood Glucose: No results found for this or any previous visit (from the past 24 hour(s)).   Goals Met:  Independence with exercise equipment Exercise tolerated well No report of cardiac concerns or symptoms Strength training completed today  Goals Unmet:  Not Applicable  Comments: Check out 1645.   Dr. Kate Sable is Medical Director for [facility] Cardiac and Pulmonary Rehab.

## 2015-12-28 ENCOUNTER — Encounter (HOSPITAL_COMMUNITY)
Admission: RE | Admit: 2015-12-28 | Discharge: 2015-12-28 | Disposition: A | Discharge status: Home / Self Care | Payer: BLUE CROSS/BLUE SHIELD | Source: Ambulatory Visit | Attending: Cardiovascular Disease | Admitting: Cardiovascular Disease

## 2015-12-28 ENCOUNTER — Encounter (HOSPITAL_COMMUNITY): Payer: BLUE CROSS/BLUE SHIELD

## 2015-12-28 DIAGNOSIS — I213 ST elevation (STEMI) myocardial infarction of unspecified site: Secondary | ICD-10-CM | POA: Diagnosis not present

## 2015-12-28 NOTE — Progress Notes (Signed)
Daily Session Note  Patient Details  Name: Catherine Mays MRN: [id number] Date of Birth: [birth year] Referring Provider:    Encounter Date: 12/28/2015  Check In:     Session Check In - 12/28/15 1545      Check-In   Location [facility]   Staff Present Akshar Starnes Angelina Pih, MS, EP, [facility], Exercise Physiologist;Debra Wynetta Emery, RN, BSN   Supervising physician immediately available to respond to emergencies See telemetry face sheet for immediately available MD   Medication changes reported     No   Fall or balance concerns reported    No   Warm-up and Cool-down Performed as group-led instruction   Resistance Training Performed Yes   VAD Patient? No     Pain Assessment   Currently in Pain? No/denies   Pain Score 0-No pain   Multiple Pain Sites No      Capillary Blood Glucose: No results found for this or any previous visit (from the past 24 hour(s)).   Goals Met:  Independence with exercise equipment Exercise tolerated well No report of cardiac concerns or symptoms Strength training completed today  Goals Unmet:  Goals for today's exercise has been meet.   Comments: Check out: 1645   Dr. Kate Sable is Medical Director for [place] and Pulmonary Rehab.

## 2015-12-31 ENCOUNTER — Encounter (HOSPITAL_COMMUNITY): Payer: BLUE CROSS/BLUE SHIELD

## 2015-12-31 ENCOUNTER — Encounter (HOSPITAL_COMMUNITY)
Admission: RE | Admit: 2015-12-31 | Discharge: 2015-12-31 | Disposition: A | Discharge status: Home / Self Care | Payer: BLUE CROSS/BLUE SHIELD | Source: Ambulatory Visit | Attending: Cardiovascular Disease | Admitting: Cardiovascular Disease

## 2015-12-31 DIAGNOSIS — I213 ST elevation (STEMI) myocardial infarction of unspecified site: Secondary | ICD-10-CM

## 2015-12-31 NOTE — Progress Notes (Signed)
Daily Session Note  Patient Details  Name: Catherine Mays MRN: [id number] Date of Birth: [birth year] Referring Provider:    Encounter Date: 12/31/2015  Check In:     Session Check In - 12/31/15 1545      Check-In   Location [facility]   Staff Present Diane Angelina Pih, MS, EP, [facility], Exercise Physiologist;Mendell Bontempo Wynetta Emery, RN, BSN   Supervising physician immediately available to respond to emergencies See telemetry face sheet for immediately available MD   Medication changes reported     No   Fall or balance concerns reported    No   Warm-up and Cool-down Performed as group-led instruction   Resistance Training Performed Yes   VAD Patient? No     Pain Assessment   Currently in Pain? No/denies   Pain Score 0-No pain   Multiple Pain Sites No      Capillary Blood Glucose: No results found for this or any previous visit (from the past 24 hour(s)).   Goals Met:  Independence with exercise equipment Exercise tolerated well No report of cardiac concerns or symptoms Strength training completed today  Goals Unmet:  Not Applicable  Comments: Check out 1645.   Dr. Kate Sable is Medical Director for [facility] Cardiac and Pulmonary Rehab.

## 2016-01-01 NOTE — Progress Notes (Signed)
Chief Complaint  Patient presents with  . Bleeding/Bruising    History of Present Illness: 50 y.o. female with history of CAD (STEMI 08/2015 s/p PTCA/DES to mid LAD, mild residual mRCA), ischemic cardiomyopathy, chronic systolic CHF, paroxysmal atrial fib (dx at time of STEMI), PVCs, hypotension who is here today for follow up. Her cardiac issues are all recent as of summer 2017. She had presented to the [facility] ED with chest pain that night and was sent home. She came back in later that night with continued chest pain and EKG showed ST elevation c/w an anterior MI. Emergent transport to [facility] and cardiac cath with occluded mid LAD treated with DES x 1. Severe LV systolic dysfunction noted post cath. Continued chest pain and relook cath 09/21/15 demonstrated patency of the LAD stent. Echo 09/20/15: EF 30-35%, akinesis of apical anteroseptal and anterior myocardium, grade 1 DD, no evidence of thrombus, PASP 30. Her hospitalization was complicated by atrial fibrillation. She was discharged with a LifeVest for LV dysfunction, amiodarone for her atrial fib, and triple blood thinner therapy for both her MI and afib. ASA stopped after one month. Coreg cut back due to fatigue. Aldactone stopped due to worsened kidney function. She has been in cardiac rehab and doing well without chest pain or dyspnea. I saw her in October 2017 and she described poor appetite, 20 lb weight loss, nausea, dyspnea, weakness, fatigue and cough. She was admitted to [facility]. Amiodarone was stopped and her symptoms resolved. She was seen by the [facility]F team in the hospital. Right heart cath with mean PA presure 38, PCWP 27 with prominent v-waves suggesting MR. Echo October 2017 with LVEF=25%, akinesis of the mid-apical, anteroseptal, and anterior walls. There was moderate to severe MR with incomplete coaptation of the leaflets. Atrial fib noted during hospitalization. She was seen in the CHF clinic 12/11/15 by Dr. Shirlee Latch and was  feeling better. She has been managed on Xarelto and Brilinta.   She is here today for follow up. She has had no chest pain or dyspnea. No LE edema. She is feeling much better. She does notice some fatigue with moderate exertion. She is still wearing the LIfevest. Echo today is pending.   Primary Care Physician: Catherine Melena, MD   Past Medical History:  Diagnosis Date  . CAD in native artery   . Chronic systolic CHF (congestive heart failure) (HCC)   . Hypotension    a. preventing med titration for HF.  Marland Kitchen Hypothyroidism 09/24/2015  . Ischemic cardiomyopathy   . PAF (paroxysmal atrial fibrillation) (HCC) 09/24/2015   a. dx at time of STEMI 08/2015.  . Pre-diabetes   . PVC's (premature ventricular contractions)     Past Surgical History:  Procedure Laterality Date  . CARDIAC CATHETERIZATION N/A 09/20/2015   Procedure: Left Heart Cath and Coronary Angiography;  Surgeon: Kathleene Hazel, MD;  Location: [facility] INVASIVE CV LAB;  Service: Cardiovascular;  Laterality: N/A;  . CARDIAC CATHETERIZATION N/A 09/20/2015   Procedure: Coronary Stent Intervention;  Surgeon: Kathleene Hazel, MD;  Location: [facility] INVASIVE CV LAB;  Service: Cardiovascular;  Laterality: N/A;  . CARDIAC CATHETERIZATION N/A 09/21/2015   Procedure: Left Heart Cath and Coronary Angiography;  Surgeon: Tonny Bollman, MD;  Location: [facility] INVASIVE CV LAB;  Service: Cardiovascular;  Laterality: N/A;  . CARDIAC CATHETERIZATION N/A 11/26/2015   Procedure: Right Heart Cath;  Surgeon: [name], MD;  Location: [facility] INVASIVE CV LAB;  Service: Cardiovascular;  Laterality: N/A;  . CORONARY STENT  PLACEMENT  09/20/2015  . THYROIDECTOMY      Current Outpatient Prescriptions  Medication Sig Dispense Refill  . atorvastatin (LIPITOR) 80 MG tablet Take 1 tablet (80 mg total) by mouth daily at 6 PM. 30 tablet 11  . Calcium Carb-Cholecalciferol (CALCIUM + D3 PO) Take 1 tablet by mouth every morning.    . carvedilol (COREG) 3.125 MG tablet  Take 1 tablet (3.125 mg total) by mouth 2 (two) times daily with a meal. 60 tablet 6  . fexofenadine (ALLEGRA) 180 MG tablet Take 180 mg by mouth daily.    . furosemide (LASIX) 40 MG tablet Take 1 tablet (40 mg total) by mouth daily. 30 tablet 6  . losartan (COZAAR) 25 MG tablet Take 0.5 tablets (12.5 mg total) by mouth at bedtime. 15 tablet 3  . nitroGLYCERIN (NITROSTAT) 0.4 MG SL tablet Place 1 tablet (0.4 mg total) under the tongue every 5 (five) minutes as needed for chest pain. 25 tablet 3  . pantoprazole (PROTONIX) 40 MG tablet Take 1 tablet (40 mg total) by mouth daily. 30 tablet 11  . potassium chloride SA (K-DUR,KLOR-CON) 20 MEQ tablet Take 3 tablets (60 mEq total) by mouth daily. 90 tablet 6  . Rivaroxaban (XARELTO) 15 MG TABS tablet Take 1 tablet (15 mg total) by mouth daily with supper. 30 tablet 6  . SYNTHROID 125 MCG tablet Take 125 mcg by mouth every morning.  5  . ticagrelor (BRILINTA) 90 MG TABS tablet Take 1 tablet (90 mg total) by mouth 2 (two) times daily. 180 tablet 3  . traZODone (DESYREL) 50 MG tablet Take 0.5 tablets (25 mg total) by mouth at bedtime as needed for sleep. 30 tablet 0  . triamcinolone (NASACORT ALLERGY 24HR) 55 MCG/ACT AERO nasal inhaler Place 2 sprays into the nose daily.     No current facility-administered medications for this visit.    Facility-Administered Medications Ordered in Other Visits  Medication Dose Route Frequency Provider Last Rate Last Dose  . bivalirudin (ANGIOMAX) 250 mg in sodium chloride 0.9 % 50 mL (5 mg/mL) infusion    Continuous PRN Kathleene Hazel, MD   Stopped at 09/20/15 [phone]  . bivalirudin (ANGIOMAX) BOLUS via infusion    PRN Kathleene Hazel, MD   54.45 mg at 09/20/15 9604  . fentaNYL (SUBLIMAZE) injection    PRN Kathleene Hazel, MD   25 mcg at 09/20/15 0651  . heparin 1,500 mL    PRN Kathleene Hazel, MD      . heparin infusion 2 units/mL in 0.9 % sodium chloride    Continuous PRN Kathleene Hazel, MD   1,500 mL at 09/20/15 0733  . iopamidol (ISOVUE-370) 76 % injection    PRN Kathleene Hazel, MD   165 mL at 09/20/15 0733  . lidocaine (PF) (XYLOCAINE) 1 % injection    PRN Kathleene Hazel, MD   2 mL at 09/20/15 5409  . midazolam (VERSED) injection    PRN Kathleene Hazel, MD   1 mg at 09/20/15 [phone]  . nitroGLYCERIN 1 mg/10 ml (100 mcg/ml) - IR/CATH LAB    PRN Kathleene Hazel, MD   200 mcg at 09/20/15 1478  . Radial Cocktail/Verapamil only    PRN Kathleene Hazel, MD   10 mL at 09/20/15 0629  . ticagrelor (BRILINTA) tablet    PRN Kathleene Hazel, MD   180 mg at 09/20/15 [phone]  . tirofiban (AGGRASTAT) bolus via infusion    PRN Cristal Deer  Ellene Route, MD   1,815 mcg at 09/20/15 [phone]  . tirofiban (AGGRASTAT) infusion 50 mcg/mL 100 mL    Continuous PRN Kathleene Hazel, MD 13.1 mL/hr at 09/20/15 0651 0.15 mcg/kg/min at 09/20/15 0651    Allergies  Allergen Reactions  . Paxil [Paroxetine] Palpitations  . Spironolactone Other (See Comments)    Kidney problems  . Morphine And Related Nausea And Vomiting  . Percocet [Oxycodone-Acetaminophen] Nausea And Vomiting  . Cefdinir Nausea Only  . Zolpidem Other (See Comments)    Doesn't work for patient at all    Social History   Social History  . Marital status: Married    Spouse name: N/A  . Number of children: N/A  . Years of education: N/A   Occupational History  . Not on file.   Social History Main Topics  . Smoking status: Never Smoker  . Smokeless tobacco: Never Used  . Alcohol use No  . Drug use: No  . Sexual activity: Not on file   Other Topics Concern  . Not on file   Social History Narrative  . No narrative on file    Family History  Problem Relation Age of Onset  . Heart attack Father   . Arrhythmia Mother     Review of Systems:  As stated in the HPI and otherwise negative.   BP (!) 92/58   Pulse 68   Ht 5\' 9"  (1.753 m)   Wt 147 lb (66.7 kg)   BMI 21.71  kg/m   Physical Examination: General: Well developed, well nourished, NAD  HEENT: OP clear, mucus membranes moist  SKIN: warm, dry. No rashes. Neuro: No focal deficits  Musculoskeletal: Muscle strength 5/5 all ext  Psychiatric: Mood and affect normal  Neck: + JVD, no carotid bruits, no thyromegaly, no lymphadenopathy.  Lungs:Clear bilaterally, no wheezes, rhonci, crackles Cardiovascular: Regular rate and rhythm. Systolic murmur noted. No gallops or rubs. Abdomen:Soft. Bowel sounds present. Non-tender.  Extremities: No lower extremity edema. Pulses are 2 + in the bilateral DP/PT.  Echo 11/25/15: Left ventricle: The cavity size was mildly dilated. Wall   thickness was normal. The estimated ejection fraction was   calculated at 35%. Visually LVEF appears to be closer to the 25%   range. Spontaneous echocontrast noted consistent with low flow   state. There is no obvious LV mural thrombus. Cardiac output   reduced based on calculated stroke volume. There is akinesis of   the mid-apicalanteroseptal and anterior myocardium. There is   dyskinesis of the apical myocardium. The study is not technically   sufficient to allow evaluation of LV diastolic function. - Mitral valve: Mildly thickened leaflets with somewhat incomplete   coaptation. There was moderate to severe regurgitation directed   posteriorly. PISA calculation does not support severe mitral   regurgitation, although the jet is eccentric and there is   systolic blunting on assessment of the right upper pulmonary   vein. - Left atrium: The atrium was mildly to moderately dilated. - Right atrium: Central venous pressure (est): 3 mm Hg. - Tricuspid valve: There was mild regurgitation. - Pulmonary arteries: PA peak pressure: 28 mm Hg (S). - Pericardium, extracardiac: There was no pericardial effusion.  Impressions:  - Mildly dilated LV chamber size with LVEF calculated at 35%,   although visually appears to be closer to 25%.  There is   spontaneous echocontrast consistent with low flow state, no   obvious LV mural thrombus. Cardiac output reduced based on   calculated stroke  volume. There is mid to apical anteroseptal and   anterior akinesis with apical dyskinesis. Mild to moderate left   atrial enlargement. Mildly thickened mitral leaflets with   somewhat incomplete coaptation. There is moderate to possibly   severe posteriorly directed mitral regurgitation as outlined   above. Mild tricuspid regurgitation with PASP 28 mmHg.  EKG:  EKG is not ordered today. The ekg ordered today demonstrates   Recent Labs: 11/23/2015: ALT 60; Magnesium 1.8; TSH 3.212 12/11/2015: B Natriuretic Peptide 417.8; BUN 19; Creatinine, Ser 1.42; Hemoglobin 10.5; Platelets 193; Potassium 3.8; Sodium 139   Lipid Panel    Component Value Date/Time   CHOL 93 (L) 10/24/2015 0809   TRIG 86 10/24/2015 0809   HDL 43 (L) 10/24/2015 0809   CHOLHDL 2.2 10/24/2015 0809   VLDL 17 10/24/2015 0809   LDLCALC 33 10/24/2015 0809     Wt Readings from Last 3 Encounters:  01/02/16 147 lb (66.7 kg)  12/11/15 148 lb 8 oz (67.4 kg)  11/26/15 146 lb 8 oz (66.5 kg)     Assessment and Plan:    1. CAD without angina: She has had no chest pain suggestive of angina. She is s/p anterior STEMI in July 2017. She is now on Brilinta. ASA stopped after one month post MI.  She is also on Xarelto. Will continue Brilinta, statin and beta blocker.   2. PAF: She is in sinus today. She did have runs of atrial fibrillation [facility]. Continue Xarelto. May need Tikosyn if she has symptomatic recurrence of atrial fibrillation. She did not tolerate amiodarone.   3. Chronic systolic CHF/Ischemic cardiomyopathy: Her volume status is ok today. She is still wearing the Lifevest. Repeat echo today is pending. If LVEF is <35%, will need referral to EP for ICD. Continue Coreg, Cozaar, Lasix.   Signed, Verne Carrowhristopher Jamieon Lannen, MD 01/02/2016 10:54  AM    [facility] [address], [place], [place]  [address] Phone: [phone]; Fax: [phone]

## 2016-01-02 ENCOUNTER — Encounter: Payer: Self-pay | Admitting: Cardiovascular Disease

## 2016-01-02 ENCOUNTER — Encounter (HOSPITAL_COMMUNITY): Payer: BLUE CROSS/BLUE SHIELD

## 2016-01-02 ENCOUNTER — Other Ambulatory Visit: Payer: Self-pay

## 2016-01-02 ENCOUNTER — Ambulatory Visit (INDEPENDENT_AMBULATORY_CARE_PROVIDER_SITE_OTHER): Payer: BLUE CROSS/BLUE SHIELD | Admitting: Cardiovascular Disease

## 2016-01-02 ENCOUNTER — Encounter (HOSPITAL_COMMUNITY)
Admission: RE | Admit: 2016-01-02 | Discharge: 2016-01-02 | Disposition: A | Discharge status: Home / Self Care | Payer: BLUE CROSS/BLUE SHIELD | Source: Ambulatory Visit | Attending: Cardiovascular Disease | Admitting: Cardiovascular Disease

## 2016-01-02 ENCOUNTER — Ambulatory Visit (HOSPITAL_COMMUNITY): Payer: BLUE CROSS/BLUE SHIELD | Attending: Cardiology

## 2016-01-02 VITALS — BP 92/58 | HR 68 | Ht 69.0 in | Wt 147.0 lb

## 2016-01-02 DIAGNOSIS — I2510 Atherosclerotic heart disease of native coronary artery without angina pectoris: Secondary | ICD-10-CM

## 2016-01-02 DIAGNOSIS — I340 Nonrheumatic mitral (valve) insufficiency: Secondary | ICD-10-CM | POA: Insufficient documentation

## 2016-01-02 DIAGNOSIS — I480 Paroxysmal atrial fibrillation: Secondary | ICD-10-CM

## 2016-01-02 DIAGNOSIS — I252 Old myocardial infarction: Secondary | ICD-10-CM | POA: Diagnosis not present

## 2016-01-02 DIAGNOSIS — I213 ST elevation (STEMI) myocardial infarction of unspecified site: Secondary | ICD-10-CM

## 2016-01-02 DIAGNOSIS — I272 Pulmonary hypertension, unspecified: Secondary | ICD-10-CM | POA: Insufficient documentation

## 2016-01-02 DIAGNOSIS — I5022 Chronic systolic (congestive) heart failure: Secondary | ICD-10-CM

## 2016-01-02 DIAGNOSIS — I255 Ischemic cardiomyopathy: Secondary | ICD-10-CM

## 2016-01-02 NOTE — Progress Notes (Signed)
Daily Session Note  Patient Details  Name: Catherine Mays MRN: [id number] Date of Birth: Apr 18, 1965 Referring Provider:    Encounter Date: 01/02/2016  Check In:     Session Check In - 01/02/16 1545      Check-In   Location [facility]   Staff Present Diane Angelina Pih, MS, EP, [facility], Exercise Physiologist;Bobbye Petti Wynetta Emery, RN, BSN   Supervising physician immediately available to respond to emergencies See telemetry face sheet for immediately available MD   Medication changes reported     No   Fall or balance concerns reported    No   Warm-up and Cool-down Performed as group-led instruction   Resistance Training Performed Yes   VAD Patient? No     Pain Assessment   Currently in Pain? No/denies   Pain Score 0-No pain   Multiple Pain Sites No      Capillary Blood Glucose: No results found for this or any previous visit (from the past 24 hour(s)).   Goals Met:  Independence with exercise equipment Exercise tolerated well No report of cardiac concerns or symptoms Strength training completed today  Goals Unmet:  Not Applicable  Comments: Check out 1645.   Dr. Kate Sable is Medical Director for [facility] Cardiac and Pulmonary Rehab.

## 2016-01-02 NOTE — Patient Instructions (Signed)
Medication Instructions:  Your physician recommends that you continue on your current medications as directed. Please refer to the Current Medication list given to you today.   Labwork: none  Testing/Procedures: none  Follow-Up: Your physician recommends that you schedule a follow-up appointment in: 6 months. Please call our office in 3 months to schedule this appointment   Any Other Special Instructions Will Be Listed Below (If Applicable).     If you need a refill on your cardiac medications before your next appointment, please call your pharmacy.   

## 2016-01-04 ENCOUNTER — Encounter (HOSPITAL_COMMUNITY)
Admission: RE | Admit: 2016-01-04 | Discharge: 2016-01-04 | Disposition: A | Discharge status: Home / Self Care | Payer: BLUE CROSS/BLUE SHIELD | Source: Ambulatory Visit | Attending: Cardiovascular Disease | Admitting: Cardiovascular Disease

## 2016-01-04 ENCOUNTER — Encounter (HOSPITAL_COMMUNITY): Payer: BLUE CROSS/BLUE SHIELD

## 2016-01-04 DIAGNOSIS — I213 ST elevation (STEMI) myocardial infarction of unspecified site: Secondary | ICD-10-CM

## 2016-01-04 DIAGNOSIS — Z955 Presence of coronary angioplasty implant and graft: Secondary | ICD-10-CM

## 2016-01-04 NOTE — Progress Notes (Signed)
Daily Session Note  Patient Details  Name: Catherine Mays MRN: [id number] Date of Birth: 21-Jun-1965 Referring Provider:    Encounter Date: 01/04/2016  Check In:     Session Check In - 01/04/16 1545      Check-In   Location [facility]   Staff Present Loic Hobin Angelina Pih, MS, EP, [facility], Exercise Physiologist;Debra Wynetta Emery, RN, BSN   Supervising physician immediately available to respond to emergencies See telemetry face sheet for immediately available MD   Medication changes reported     No   Fall or balance concerns reported    No   Warm-up and Cool-down Performed as group-led instruction   Resistance Training Performed Yes   VAD Patient? No     Pain Assessment   Currently in Pain? No/denies   Pain Score 0-No pain   Multiple Pain Sites No      Capillary Blood Glucose: No results found for this or any previous visit (from the past 24 hour(s)).   Goals Met:  Independence with exercise equipment Exercise tolerated well No report of cardiac concerns or symptoms Strength training completed today  Goals Unmet:  Not Applicable  Comments: Check out: 4:45   Dr. Kate Sable is Medical Director for [place] and Pulmonary Rehab.

## 2016-01-07 ENCOUNTER — Encounter (HOSPITAL_COMMUNITY)
Admission: RE | Admit: 2016-01-07 | Discharge: 2016-01-07 | Disposition: A | Discharge status: Home / Self Care | Payer: BLUE CROSS/BLUE SHIELD | Source: Ambulatory Visit | Attending: Cardiovascular Disease | Admitting: Cardiovascular Disease

## 2016-01-07 ENCOUNTER — Encounter (HOSPITAL_COMMUNITY): Payer: BLUE CROSS/BLUE SHIELD

## 2016-01-07 DIAGNOSIS — I213 ST elevation (STEMI) myocardial infarction of unspecified site: Secondary | ICD-10-CM | POA: Diagnosis not present

## 2016-01-07 NOTE — Progress Notes (Signed)
Daily Session Note  Patient Details  Name: Catherine Mays MRN: [id number] Date of Birth: May 31, 1965 Referring Provider:    Encounter Date: 01/07/2016  Check In:     Session Check In - 01/07/16 1545      Check-In   Location [facility]   Staff Present Diane Angelina Pih, MS, EP, [facility], Exercise Physiologist;Keora Eccleston Wynetta Emery, RN, BSN   Supervising physician immediately available to respond to emergencies See telemetry face sheet for immediately available MD   Medication changes reported     No   Fall or balance concerns reported    No   Warm-up and Cool-down Performed as group-led instruction   Resistance Training Performed Yes   VAD Patient? No     Pain Assessment   Currently in Pain? No/denies   Pain Score 0-No pain   Multiple Pain Sites No      Capillary Blood Glucose: No results found for this or any previous visit (from the past 24 hour(s)).   Goals Met:  Independence with exercise equipment Exercise tolerated well No report of cardiac concerns or symptoms Strength training completed today  Goals Unmet:  Not Applicable  Comments: Check out 1645.   Dr. Kate Sable is Medical Director for [facility] Cardiac and Pulmonary Rehab.

## 2016-01-07 NOTE — Progress Notes (Signed)
Electrophysiology Office Note   Date:  01/08/2016   ID:  Catherine Mays, DOB [birth year], MRN [id number]  PCP:  Dwana Melena, MD  Cardiologist:  Janett Labella Primary Electrophysiologist:  Regan Lemming, MD    Chief Complaint  Patient presents with  . CONSULT    Chronic systolic CHF      History of Present Illness: Catherine Mays is a 50 y.o. female who presents today for electrophysiology evaluation.   History of CAD (STEMI 08/2015 s/p PTCA/DES to mid LAD, mild residual mRCA), ischemic cardiomyopathy, chronic systolic CHF, paroxysmal atrial fib (dx at time of STEMI), PVCs, hypotension. She had presented to the [facility] ED with chest pain that night and was sent home. She came back in later that night with continued chest pain and EKG showed ST elevation c/w an anterior MI. Emergent transport to [facility] and cardiac cath with occluded mid LAD treated with DES x 1 on 09/20/15. She was discharged with a LifeVest for LV dysfunction, amiodarone for her atrial fib, and triple blood thinner therapy for both her MI and afib. ASA stopped after one month. Coreg cut back due to fatigue. Aldactone stopped due to worsened kidney function. She was taken off amiodarone due to poor appetite, 20 lb weight loss, nausea, dyspnea, weakness, fatigue and cough.  Today, she denies symptoms of palpitations, chest pain,  PND, lower extremity edema, claudication, dizziness, presyncope, syncope, bleeding, or neurologic sequela. The patient is tolerating medications without difficulties and is otherwise without complaint today. She still remained short of breath when she exerts herself or when she lies flat. Most the time she feels well although, and is able to do most of her daily activities.   Past Medical History:  Diagnosis Date  . CAD in native artery   . Chronic systolic CHF (congestive heart failure) (HCC)   . Hypotension    a. preventing med titration for HF.  Marland Kitchen Hypothyroidism 09/24/2015  .  Ischemic cardiomyopathy   . PAF (paroxysmal atrial fibrillation) (HCC) 09/24/2015   a. dx at time of STEMI 08/2015.  . Pre-diabetes   . PVC's (premature ventricular contractions)    Past Surgical History:  Procedure Laterality Date  . CARDIAC CATHETERIZATION N/A 09/20/2015   Procedure: Left Heart Cath and Coronary Angiography;  Surgeon: Kathleene Hazel, MD;  Location: [facility] INVASIVE CV LAB;  Service: Cardiovascular;  Laterality: N/A;  . CARDIAC CATHETERIZATION N/A 09/20/2015   Procedure: Coronary Stent Intervention;  Surgeon: Kathleene Hazel, MD;  Location: [facility] INVASIVE CV LAB;  Service: Cardiovascular;  Laterality: N/A;  . CARDIAC CATHETERIZATION N/A 09/21/2015   Procedure: Left Heart Cath and Coronary Angiography;  Surgeon: Tonny Bollman, MD;  Location: [facility] INVASIVE CV LAB;  Service: Cardiovascular;  Laterality: N/A;  . CARDIAC CATHETERIZATION N/A 11/26/2015   Procedure: Right Heart Cath;  Surgeon: [name], MD;  Location: [facility] INVASIVE CV LAB;  Service: Cardiovascular;  Laterality: N/A;  . CORONARY STENT PLACEMENT  09/20/2015  . THYROIDECTOMY       Current Outpatient Prescriptions  Medication Sig Dispense Refill  . atorvastatin (LIPITOR) 80 MG tablet Take 1 tablet (80 mg total) by mouth daily at 6 PM. 30 tablet 11  . Calcium Carb-Cholecalciferol (CALCIUM + D3 PO) Take 1 tablet by mouth every morning.    . carvedilol (COREG) 3.125 MG tablet Take 1 tablet (3.125 mg total) by mouth 2 (two) times daily with a meal. 60 tablet 6  . fexofenadine (ALLEGRA) 180 MG tablet Take 180 mg by mouth  daily.    . furosemide (LASIX) 40 MG tablet Take 1 tablet (40 mg total) by mouth daily. 30 tablet 6  . losartan (COZAAR) 25 MG tablet Take 0.5 tablets (12.5 mg total) by mouth at bedtime. 15 tablet 3  . nitroGLYCERIN (NITROSTAT) 0.4 MG SL tablet Place 1 tablet (0.4 mg total) under the tongue every 5 (five) minutes as needed for chest pain. 25 tablet 3  . pantoprazole (PROTONIX) 40 MG tablet Take  1 tablet (40 mg total) by mouth daily. 30 tablet 11  . potassium chloride SA (K-DUR,KLOR-CON) 20 MEQ tablet Take 3 tablets (60 mEq total) by mouth daily. 90 tablet 6  . Rivaroxaban (XARELTO) 15 MG TABS tablet Take 1 tablet (15 mg total) by mouth daily with supper. 30 tablet 6  . SYNTHROID 125 MCG tablet Take 125 mcg by mouth every morning.  5  . ticagrelor (BRILINTA) 90 MG TABS tablet Take 1 tablet (90 mg total) by mouth 2 (two) times daily. 180 tablet 3  . traZODone (DESYREL) 50 MG tablet Take 0.5 tablets (25 mg total) by mouth at bedtime as needed for sleep. 30 tablet 0  . triamcinolone (NASACORT ALLERGY 24HR) 55 MCG/ACT AERO nasal inhaler Place 2 sprays into the nose daily.     No current facility-administered medications for this visit.    Facility-Administered Medications Ordered in Other Visits  Medication Dose Route Frequency Provider Last Rate Last Dose  . bivalirudin (ANGIOMAX) 250 mg in sodium chloride 0.9 % 50 mL (5 mg/mL) infusion    Continuous PRN Kathleene Hazelhristopher D McAlhany, MD   Stopped at 09/20/15 [phone]  . bivalirudin (ANGIOMAX) BOLUS via infusion    PRN Kathleene Hazelhristopher D McAlhany, MD   54.45 mg at 09/20/15 [phone]  . fentaNYL (SUBLIMAZE) injection    PRN Kathleene Hazelhristopher D McAlhany, MD   25 mcg at 09/20/15 0651  . heparin 1,500 mL    PRN Kathleene Hazelhristopher D McAlhany, MD      . heparin infusion 2 units/mL in 0.9 % sodium chloride    Continuous PRN Kathleene Hazelhristopher D McAlhany, MD   1,500 mL at 09/20/15 0733  . iopamidol (ISOVUE-370) 76 % injection    PRN Kathleene Hazelhristopher D McAlhany, MD   165 mL at 09/20/15 0733  . lidocaine (PF) (XYLOCAINE) 1 % injection    PRN Kathleene Hazelhristopher D McAlhany, MD   2 mL at 09/20/15 [id number]  . midazolam (VERSED) injection    PRN Kathleene Hazelhristopher D McAlhany, MD   1 mg at 09/20/15 [phone]  . nitroGLYCERIN 1 mg/10 ml (100 mcg/ml) - IR/CATH LAB    PRN Kathleene Hazelhristopher D McAlhany, MD   200 mcg at 09/20/15 [phone]  . Radial Cocktail/Verapamil only    PRN Kathleene Hazelhristopher D McAlhany, MD   10 mL at 09/20/15 0629  .  ticagrelor (BRILINTA) tablet    PRN Kathleene Hazelhristopher D McAlhany, MD   180 mg at 09/20/15 [phone]  . tirofiban (AGGRASTAT) bolus via infusion    PRN Kathleene Hazelhristopher D McAlhany, MD   1,815 mcg at 09/20/15 [phone]  . tirofiban (AGGRASTAT) infusion 50 mcg/mL 100 mL    Continuous PRN Kathleene Hazelhristopher D McAlhany, MD 13.1 mL/hr at 09/20/15 0651 0.15 mcg/kg/min at 09/20/15 [phone]    Allergies:   Paxil [paroxetine]; Spironolactone; Morphine and related; Percocet [oxycodone-acetaminophen]; Cefdinir; and Zolpidem   Social History:  The patient  reports that she has never smoked. She has never used smokeless tobacco. She reports that she does not drink alcohol or use drugs.   Family History:  The patient's family history  includes Arrhythmia in her mother; Heart attack in her father.    ROS:  Please see the history of present illness.   Otherwise, review of systems is positive for orthopnea, DOE.   All other systems are reviewed and negative.    PHYSICAL EXAM: VS:  BP 92/66   Pulse 71   Ht 5\' 9"  (1.753 m)   Wt 149 lb 12.8 oz (67.9 kg)   BMI 22.12 kg/m  , BMI Body mass index is 22.12 kg/m. GEN: Well nourished, well developed, in no acute distress  HEENT: normal  Neck: no JVD, carotid bruits, or masses Cardiac: RRR; no murmurs, rubs, or gallops,no edema  Respiratory:  clear to auscultation bilaterally, normal work of breathing GI: soft, nontender, nondistended, + BS MS: no deformity or atrophy  Skin: warm and dry Neuro:  Strength and sensation are intact Psych: euthymic mood, full affect  EKG:  EKG is ordered today. Personal review of the ekg ordered shows sinus rhythm, rate 71, rate 71, possible anterior infarct  Recent Labs: 11/23/2015: ALT 60; Magnesium 1.8; TSH 3.212 12/11/2015: B Natriuretic Peptide 417.8; BUN 19; Creatinine, Ser 1.42; Hemoglobin 10.5; Platelets 193; Potassium 3.8; Sodium 139    Lipid Panel     Component Value Date/Time   CHOL 93 (L) 10/24/2015 0809   TRIG 86 10/24/2015 0809   HDL 43  (L) 10/24/2015 0809   CHOLHDL 2.2 10/24/2015 0809   VLDL 17 10/24/2015 0809   LDLCALC 33 10/24/2015 0809     Wt Readings from Last 3 Encounters:  01/08/16 149 lb 12.8 oz (67.9 kg)  01/02/16 147 lb (66.7 kg)  12/11/15 148 lb 8 oz (67.4 kg)      Other studies Reviewed: Additional studies/ records that were reviewed today include: TTE 01/02/16  Review of the above records today demonstrates:  - Left ventricle: The cavity size was normal. Wall thickness was   normal. Akinesis of the mid to apical anterior wall, the true   apex, and the apical lateral wall. The mid to apical anteroseptal   and inferoseptal walls were severely hypokinetic. Systolic   function was moderately to severely reduced. The estimated   ejection fraction was in the range of 30% to 35%. Features are   consistent with a pseudonormal left ventricular filling pattern,   with concomitant abnormal relaxation and increased filling   pressure (grade 2 diastolic dysfunction). E/medial e&' > 15,   suggesting LV end diastolic pressure at least 20 mmHg. - Aortic valve: There was no stenosis. - Mitral valve: Mildly calcified annulus. Mildly calcified leaflets   . There was severe regurgitation. - Left atrium: The atrium was moderately dilated. - Right ventricle: The cavity size was normal. Systolic function   was normal. - Tricuspid valve: Peak RV-RA gradient (S): 59 mm Hg. - Pulmonary arteries: PA peak pressure: 62 mm Hg (S). - Inferior vena cava: The vessel was normal in size. The   respirophasic diameter changes were in the normal range (= 50%),   consistent with normal central venous pressure. - Pericardium, extracardiac: Small pleural effusion was noted.   ASSESSMENT AND PLAN:  1.  Coronary artery disease: Currently no chest pain. Continue current management.   2. Paroxysmal atrial fibrillation: on Xarelto.   3. Ischemic cardiomyopathy: LVEF remains low after therapy on OMT. Currently on Coreg, Cozaar, lasix.  Has been on therapy for >3 months. Discussed the options of defibrillator therapy. Risks and benefits were discussed. Risks include bleeding, tamponade, infection, pneumothorax, among others. She understands  these risks and has agreed to the procedure.    Current medicines are reviewed at length with the patient today.   The patient does not have concerns regarding her medicines.  The following changes were made today:  none  Labs/ tests ordered today include:  Orders Placed This Encounter  Procedures  . EKG 12-Lead     Disposition:   FU with Shuan Statzer 3 months  Signed, Jennine Peddy Jorja Loa, MD  01/08/2016 12:42 PM     [facility] [address] [place] [address] [phone] (office) [phone] (fax)

## 2016-01-08 ENCOUNTER — Encounter: Payer: Self-pay | Admitting: Cardiology

## 2016-01-08 ENCOUNTER — Other Ambulatory Visit: Payer: Self-pay | Admitting: Cardiology

## 2016-01-08 ENCOUNTER — Ambulatory Visit (INDEPENDENT_AMBULATORY_CARE_PROVIDER_SITE_OTHER): Payer: BLUE CROSS/BLUE SHIELD | Admitting: Cardiology

## 2016-01-08 ENCOUNTER — Encounter: Payer: Self-pay | Admitting: *Deleted

## 2016-01-08 VITALS — BP 92/66 | HR 71 | Ht 69.0 in | Wt 149.8 lb

## 2016-01-08 DIAGNOSIS — I255 Ischemic cardiomyopathy: Secondary | ICD-10-CM

## 2016-01-08 DIAGNOSIS — Z01812 Encounter for preprocedural laboratory examination: Secondary | ICD-10-CM | POA: Diagnosis not present

## 2016-01-08 LAB — CBC WITH DIFFERENTIAL/PLATELET
Basophils Absolute: 0 cells/uL (ref 0–200)
Basophils Relative: 0 %
Eosinophils Absolute: 210 cells/uL (ref 15–500)
Eosinophils Relative: 3 %
HCT: 32.5 % — ABNORMAL LOW (ref 35.0–45.0)
Hemoglobin: 10.4 g/dL — ABNORMAL LOW (ref 11.7–15.5)
Lymphocytes Relative: 28 %
Lymphs Abs: 1960 cells/uL (ref 850–3900)
MCH: 29.8 pg (ref 27.0–33.0)
MCHC: 32 g/dL (ref 32.0–36.0)
MCV: 93.1 fL (ref 80.0–100.0)
MPV: 10.4 fL (ref 7.5–12.5)
Monocytes Absolute: 630 cells/uL (ref 200–950)
Monocytes Relative: 9 %
Neutro Abs: 4200 cells/uL (ref 1500–7800)
Neutrophils Relative %: 60 %
Platelets: 232 10*3/uL (ref 140–400)
RBC: 3.49 MIL/uL — ABNORMAL LOW (ref 3.80–5.10)
RDW: 14.2 % (ref 11.0–15.0)
WBC: 7 10*3/uL (ref 3.8–10.8)

## 2016-01-08 LAB — BASIC METABOLIC PANEL
BUN: 17 mg/dL (ref 7–25)
CO2: 29 mmol/L (ref 20–31)
Calcium: 8.6 mg/dL (ref 8.6–10.2)
Chloride: 102 mmol/L (ref 98–110)
Creat: 1.27 mg/dL — ABNORMAL HIGH (ref 0.50–1.10)
Glucose, Bld: 103 mg/dL — ABNORMAL HIGH (ref 65–99)
Potassium: 4 mmol/L (ref 3.5–5.3)
Sodium: 139 mmol/L (ref 135–146)

## 2016-01-08 NOTE — Addendum Note (Signed)
Addended by: Sherri Rad C on: 01/08/2016 12:54 PM   Modules accepted: Orders

## 2016-01-08 NOTE — Patient Instructions (Addendum)
Medication Instructions: - Your physician recommends that you continue on your current medications as directed. Please refer to the Current Medication list given to you today.  Labwork: - Your physician recommends that you have lab work today: BMP/ CBC  Procedures/Testing: - Your physician has recommended that you have a defibrillator inserted. An implantable cardioverter defibrillator (ICD) is a small device that is placed in your chest or, in rare cases, your abdomen. This device uses electrical pulses or shocks to help control life-threatening, irregular heartbeats that could lead the heart to suddenly stop beating (sudden cardiac arrest). Leads are attached to the ICD that goes into your heart. This is done in the hospital and usually requires an overnight stay. Please see the instruction sheet given to you today for more information.  Follow-Up: - Your physician recommends that you schedule a follow-up appointment in: 10-14 days (from 01/11/16) for a wound check with the [facility]  - Your physician recommends that you schedule a follow-up appointment in: 91 days (from 01/11/16) with Dr. Elberta Fortis.  Any Additional Special Instructions Will Be Listed Below (If Applicable).     If you need a refill on your cardiac medications before your next appointment, please call your pharmacy.

## 2016-01-09 ENCOUNTER — Encounter (HOSPITAL_COMMUNITY): Payer: BLUE CROSS/BLUE SHIELD

## 2016-01-09 ENCOUNTER — Telehealth: Payer: Self-pay | Admitting: *Deleted

## 2016-01-09 ENCOUNTER — Institutional Professional Consult (permissible substitution): Payer: BLUE CROSS/BLUE SHIELD | Admitting: Internal Medicine

## 2016-01-09 NOTE — Progress Notes (Signed)
Cardiac Individual Treatment Plan  Patient Details  Name: Catherine Mays MRN: [id number] Date of Birth: 1965/11/15 Referring Provider:    Initial Encounter Date:  [place] PHASE II ORIENTATION from 10/22/2015 in [facility]  Date  10/22/15      Visit Diagnosis: ST elevation myocardial infarction (STEMI), unspecified artery ([place])  Patient's Home Medications on Admission:  Current Outpatient Prescriptions:  .  atorvastatin (LIPITOR) 80 MG tablet, Take 1 tablet (80 mg total) by mouth daily at 6 PM., Disp: 30 tablet, Rfl: 11 .  Calcium Carb-Cholecalciferol (CALCIUM + D3 PO), Take 1 tablet by mouth every morning., Disp: , Rfl:  .  carvedilol (COREG) 3.125 MG tablet, Take 1 tablet (3.125 mg total) by mouth 2 (two) times daily with a meal., Disp: 60 tablet, Rfl: 6 .  fexofenadine (ALLEGRA) 180 MG tablet, Take 180 mg by mouth daily., Disp: , Rfl:  .  furosemide (LASIX) 40 MG tablet, Take 1 tablet (40 mg total) by mouth daily., Disp: 30 tablet, Rfl: 6 .  losartan (COZAAR) 25 MG tablet, Take 0.5 tablets (12.5 mg total) by mouth at bedtime., Disp: 15 tablet, Rfl: 3 .  nitroGLYCERIN (NITROSTAT) 0.4 MG SL tablet, Place 1 tablet (0.4 mg total) under the tongue every 5 (five) minutes as needed for chest pain., Disp: 25 tablet, Rfl: 3 .  pantoprazole (PROTONIX) 40 MG tablet, Take 1 tablet (40 mg total) by mouth daily., Disp: 30 tablet, Rfl: 11 .  potassium chloride SA (K-DUR,KLOR-CON) 20 MEQ tablet, Take 3 tablets (60 mEq total) by mouth daily., Disp: 90 tablet, Rfl: 6 .  Rivaroxaban (XARELTO) 15 MG TABS tablet, Take 1 tablet (15 mg total) by mouth daily with supper., Disp: 30 tablet, Rfl: 6 .  SYNTHROID 125 MCG tablet, Take 125 mcg by mouth every morning., Disp: , Rfl: 5 .  ticagrelor (BRILINTA) 90 MG TABS tablet, Take 1 tablet (90 mg total) by mouth 2 (two) times daily., Disp: 180 tablet, Rfl: 3 .  traZODone (DESYREL) 50 MG tablet, Take 0.5 tablets (25 mg  total) by mouth at bedtime as needed for sleep., Disp: 30 tablet, Rfl: 0 .  triamcinolone (NASACORT ALLERGY 24HR) 55 MCG/ACT AERO nasal inhaler, Place 2 sprays into the nose daily., Disp: , Rfl:  No current facility-administered medications for this encounter.   Facility-Administered Medications Ordered in Other Encounters:  .  bivalirudin (ANGIOMAX) 250 mg in sodium chloride 0.9 % 50 mL (5 mg/mL) infusion, , , Continuous PRN, Burnell Blanks, MD, Stopped at 09/20/15 [phone] .  bivalirudin (ANGIOMAX) BOLUS via infusion, , , PRN, Burnell Blanks, MD, 54.45 mg at 09/20/15 4818 .  fentaNYL (SUBLIMAZE) injection, , , PRN, Burnell Blanks, MD, 25 mcg at 09/20/15 0651 .  heparin 1,500 mL, , , PRN, Burnell Blanks, MD .  heparin infusion 2 units/mL in 0.9 % sodium chloride, , , Continuous PRN, Burnell Blanks, MD, 1,500 mL at 09/20/15 0733 .  iopamidol (ISOVUE-370) 76 % injection, , , PRN, Burnell Blanks, MD, 165 mL at 09/20/15 0733 .  lidocaine (PF) (XYLOCAINE) 1 % injection, , , PRN, Burnell Blanks, MD, 2 mL at 09/20/15 5631 .  midazolam (VERSED) injection, , , PRN, Burnell Blanks, MD, 1 mg at 09/20/15 0651 .  nitroGLYCERIN 1 mg/10 ml (100 mcg/ml) - IR/CATH LAB, , , PRN, Burnell Blanks, MD, 200 mcg at 09/20/15 4970 .  Radial Cocktail/Verapamil only, , , PRN, Burnell Blanks, MD, 10 mL at  09/20/15 5852 .  ticagrelor (BRILINTA) tablet, , , PRN, Burnell Blanks, MD, 180 mg at 09/20/15 [phone] .  tirofiban (AGGRASTAT) bolus via infusion, , , PRN, Burnell Blanks, MD, 1,815 mcg at 09/20/15 [phone] .  tirofiban (AGGRASTAT) infusion 50 mcg/mL 100 mL, , , Continuous PRN, Burnell Blanks, MD, Last Rate: 13.1 mL/hr at 09/20/15 0651, 0.15 mcg/kg/min at 09/20/15 3614  Past Medical History: Past Medical History:  Diagnosis Date  . CAD in native artery   . Chronic systolic CHF (congestive heart failure) ([place])   . Hypotension     a. preventing med titration for HF.  [facility] Hypothyroidism 09/24/2015  . Ischemic cardiomyopathy   . PAF (paroxysmal atrial fibrillation) ([place]) 09/24/2015   a. dx at time of STEMI 08/2015.  . Pre-diabetes   . PVC's (premature ventricular contractions)     Tobacco Use: History  Smoking Status  . Never Smoker  Smokeless Tobacco  . Never Used    Labs: Recent Review Flowsheet Data    Labs for ITP Cardiac and Pulmonary Rehab Latest Ref Rng & Units 10/10/2015 10/24/2015 11/26/2015 11/26/2015   Cholestrol 125 - 200 mg/dL - 93(L) - -   LDLCALC <130 mg/dL - 33 - -   HDL >=46 mg/dL - 43(L) - -   Trlycerides <150 mg/dL - 86 - -   Hemoglobin A1c <5.7 % 6.2(H) - - -   HCO3 20.0 - 28.0 mmol/L - - 23.3 22.8   TCO2 0 - 100 mmol/L - - 24 24   ACIDBASEDEF 0.0 - 2.0 mmol/L - - 1.0 1.0   O2SAT % - - 68.0 66.0      Capillary Blood Glucose: No results found for: GLUCAP   Exercise Target Goals:    Exercise Program Goal: Individual exercise prescription set with THRR, safety & activity barriers. Participant demonstrates ability to understand and report RPE using BORG scale, to self-measure pulse accurately, and to acknowledge the importance of the exercise prescription.  Exercise Prescription Goal: Starting with aerobic activity 30 plus minutes a day, 3 days per week for initial exercise prescription. Provide home exercise prescription and guidelines that participant acknowledges understanding prior to discharge.  Activity Barriers & Risk Stratification:     Activity Barriers & Cardiac Risk Stratification - 10/22/15 1520      Activity Barriers & Cardiac Risk Stratification   Activity Barriers None   Cardiac Risk Stratification High      6 Minute Walk:     6 Minute Walk    Row Name 10/22/15 1422         6 Minute Walk   Phase Initial     Distance 1500 feet     Walk Time 6 minutes     # of Rest Breaks 0     MPH 2.84     METS 3.17     RPE 8     Perceived Dyspnea  7     VO2 Peak  15.73     Symptoms No     Resting HR 74 bpm     Resting BP 90/60     Max Ex. HR 94 bpm     Max Ex. BP 106/70     2 Minute Post BP 96/66        Initial Exercise Prescription:     Initial Exercise Prescription - 10/22/15 1400      Date of Initial Exercise RX and Referring Provider   Date 10/22/15     NuStep   Level  2   Watts 15   Minutes 15   METs 1.9     Elliptical   Level 1   Speed 1.5   Minutes 20   METs 1.9     Prescription Details   Frequency (times per week) 3   Duration Progress to 30 minutes of continuous aerobic without signs/symptoms of physical distress     Intensity   THRR REST +  30   THRR 40-80% of Max Heartrate [phone]   Ratings of Perceived Exertion 11-13   Perceived Dyspnea 0-4     Progression   Progression Continue progressive overload as per policy without signs/symptoms or physical distress.     Resistance Training   Training Prescription Yes   Weight 1   Reps 10-12      Perform Capillary Blood Glucose checks as needed.  Exercise Prescription Changes:      Exercise Prescription Changes    Row Name 11/06/15 1500 12/04/15 0800 01/01/16 1200         Exercise Review   Progression Yes Yes Yes       Response to Exercise   Blood Pressure (Admit) '96/40 88/60 90/56 '$     Blood Pressure (Exercise) 130/70 110/64 98/60     Blood Pressure (Exit) 1'08/62 86/68 86/50 '$     Heart Rate (Admit) 81 bpm 70 bpm 70 bpm     Heart Rate (Exercise) 93 bpm 91 bpm 100 bpm     Heart Rate (Exit) 90 bpm 78 bpm 77 bpm     Rating of Perceived Exertion (Exercise) '8 8 10     '$ Duration Progress to 30 minutes of continuous aerobic without signs/symptoms of physical distress Progress to 30 minutes of continuous aerobic without signs/symptoms of physical distress Progress to 30 minutes of continuous aerobic without signs/symptoms of physical distress     Intensity Rest + 30 Rest + 30 Rest + 30       Progression   Progression Continue progressive overload as per  policy without signs/symptoms or physical distress. Continue progressive overload as per policy without signs/symptoms or physical distress. Continue progressive overload as per policy without signs/symptoms or physical distress.       Resistance Training   Training Prescription Yes Yes Yes     Weight '2 2 4     '$ Reps 10-12 10-12 10-12       Treadmill   MPH 2.2 2.9 3     Grade 0 0 0     Minutes '15 15 15     '$ METs 2.68 3.2 3.3       NuStep   Level '2 2 3     '$ Watts 28 20 35     Minutes '15 20 20     '$ METs 3.45 3.43 3.43       Elliptical   Level 1 0 0     Speed 1.5 0 0     Minutes 20 0 0     METs 1.9 0 0       Home Exercise Plan   Plans to continue exercise at [place]     Frequency Add 2 additional days to program exercise sessions. Add 2 additional days to program exercise sessions. Add 2 additional days to program exercise sessions.        Exercise Comments:      Exercise Comments    Row Name 11/06/15 1543 12/04/15 0816 01/01/16 1255       Exercise Comments Patient is proggressing appropriately, was  switched from standing elliptical to treadmill due to tightness in chest area  Patient is progressing well. Patient was taken off of elliptical due to chest tightness  Patient is proggressing well.         Discharge Exercise Prescription (Final Exercise Prescription Changes):     Exercise Prescription Changes - 01/01/16 1200      Exercise Review   Progression Yes     Response to Exercise   Blood Pressure (Admit) 90/56   Blood Pressure (Exercise) 98/60   Blood Pressure (Exit) 86/50   Heart Rate (Admit) 70 bpm   Heart Rate (Exercise) 100 bpm   Heart Rate (Exit) 77 bpm   Rating of Perceived Exertion (Exercise) 10   Duration Progress to 30 minutes of continuous aerobic without signs/symptoms of physical distress   Intensity Rest + 30     Progression   Progression Continue progressive overload as per policy without signs/symptoms or physical distress.      Resistance Training   Training Prescription Yes   Weight 4   Reps 10-12     Treadmill   MPH 3   Grade 0   Minutes 15   METs 3.3     NuStep   Level 3   Watts 35   Minutes 20   METs 3.43     Elliptical   Level 0   Speed 0   Minutes 0   METs 0     Home Exercise Plan   Plans to continue exercise at Home   Frequency Add 2 additional days to program exercise sessions.      Nutrition:  Target Goals: Understanding of nutrition guidelines, daily intake of sodium '1500mg'$ , cholesterol '200mg'$ , calories 30% from fat and 7% or less from saturated fats, daily to have 5 or more servings of fruits and vegetables.  Biometrics:     Pre Biometrics - 10/22/15 1425      Pre Biometrics   Height '5\' 9"'$  (1.753 m)   Weight 153 lb 3.5 oz (69.5 kg)   Waist Circumference 33 inches   Hip Circumference 37.5 inches   Waist to Hip Ratio 0.88 %   BMI (Calculated) 22.7   Triceps Skinfold 12 mm   % Body Fat 20.8 %   Grip Strength 70 kg   Flexibility 15 in   Single Leg Stand 60 seconds       Nutrition Therapy Plan and Nutrition Goals:   Nutrition Discharge: Rate Your Plate Scores:     Nutrition Assessments - 10/22/15 1522      MEDFICTS Scores   Pre Score 41      Nutrition Goals Re-Evaluation:   Psychosocial: Target Goals: Acknowledge presence or absence of depression, maximize coping skills, provide positive support system. Participant is able to verbalize types and ability to use techniques and skills needed for reducing stress and depression.  Initial Review & Psychosocial Screening:     Initial Psych Review & Screening - 10/22/15 1526      Initial Review   Current issues with Current Stress Concerns;Current Sleep Concerns;Current Depression   Source of Stress Concerns Occupation     [place]? Yes     Barriers   Psychosocial barriers to participate in program There are no identifiable barriers or psychosocial needs.     Screening  Interventions   Interventions Encouraged to exercise      Quality of Life Scores:     Quality of Life - 10/22/15 1426  Quality of Life Scores   Health/Function Pre 16.4 %   Socioeconomic Pre 27.07 %   Psych/Spiritual Pre 24 %   Family Pre 22.5 %   GLOBAL Pre 21.02 %      PHQ-9: Recent Review Flowsheet Data    Depression screen [facility] 2/9 10/22/2015   Decreased Interest 1   Down, Depressed, Hopeless 1   PHQ - 2 Score 2   Altered sleeping 2   Tired, decreased energy 1   Change in appetite 0   Feeling bad or failure about yourself  1   Trouble concentrating 0   Moving slowly or fidgety/restless 0   Suicidal thoughts 0   PHQ-9 Score 6   Difficult doing work/chores Somewhat difficult      Psychosocial Evaluation and Intervention:     Psychosocial Evaluation - 10/22/15 1527      Psychosocial Evaluation & Interventions   Interventions Stress management education;Relaxation education;Encouraged to exercise with the program and follow exercise prescription   Continued Psychosocial Services Needed Yes  Patient will be monitored through out the program to ensure exercising has improved her feelings of depression.       Psychosocial Re-Evaluation:     Psychosocial Re-Evaluation    [place] Name 11/09/15 1439 12/05/15 1507 01/09/16 1355         Psychosocial Re-Evaluation   Interventions Encouraged to attend Cardiac Rehabilitation for the exercise Encouraged to attend Cardiac Rehabilitation for the exercise Encouraged to attend Cardiac Rehabilitation for the exercise     Comments Patient's QOL score was 21.02 and her PHQ-9 score was 6. Patient has had some depression since her cardiac event but she feels some improvement coming to program. She feels she does not need counseling.   Patient's QOL score was 21.02 and her PHQ-9 score was 6. Patient has had some depression since her cardiac event but feels she does not need counseling. Her depression has improved since starting the  program.  Patient says she does not feel as depressed and has a better outlook overall.      Continued Psychosocial Services Needed  -  - No        Vocational Rehabilitation: Provide vocational rehab assistance to qualifying candidates.   Vocational Rehab Evaluation & Intervention:     Vocational Rehab - 10/22/15 1521      Initial Vocational Rehab Evaluation & Intervention   Assessment shows need for Vocational Rehabilitation No      Education: Education Goals: Education classes will be provided on a weekly basis, covering required topics. Participant will state understanding/return demonstration of topics presented.  Learning Barriers/Preferences:     Learning Barriers/Preferences - 10/22/15 1521      Learning Barriers/Preferences   Learning Barriers None   Learning Preferences Audio;Pictoral;Skilled Demonstration;Verbal Instruction;Written Material;Video      Education Topics: Hypertension, Hypertension Reduction -Define heart disease and high blood pressure. Discus how high blood pressure affects the body and ways to reduce high blood pressure. Flowsheet Row CARDIAC REHAB PHASE II EXERCISE from 01/02/2016 in [place]  Date  12/26/15  Educator  DC  Instruction Review Code  2- meets goals/outcomes      Exercise and Your Heart -Discuss why it is important to exercise, the FITT principles of exercise, normal and abnormal responses to exercise, and how to exercise safely. Flowsheet Row CARDIAC REHAB PHASE II EXERCISE from 01/02/2016 in [place] [facility]  Date  01/02/16  Educator  Russella Dar  Instruction Review Code  2- meets goals/outcomes  Angina -Discuss definition of angina, causes of angina, treatment of angina, and how to decrease risk of having angina.   Cardiac Medications -Review what the following cardiac medications are used for, how they affect the body, and side effects that may occur when taking the  medications.  Medications include Aspirin, Beta blockers, calcium channel blockers, ACE Inhibitors, angiotensin receptor blockers, diuretics, digoxin, and antihyperlipidemics.   Congestive Heart Failure -Discuss the definition of CHF, how to live with CHF, the signs and symptoms of CHF, and how keep track of weight and sodium intake.   Heart Disease and Intimacy -Discus the effect sexual activity has on the heart, how changes occur during intimacy as we age, and safety during sexual activity. Flowsheet Row CARDIAC REHAB PHASE II EXERCISE from 01/02/2016 in [place]  Date  10/31/15  Educator  DJ  Instruction Review Code  2- meets goals/outcomes      Smoking Cessation / COPD -Discuss different methods to quit smoking, the health benefits of quitting smoking, and the definition of COPD. Flowsheet Row CARDIAC REHAB PHASE II EXERCISE from 01/02/2016 in [place]  Date  11/07/15  Educator  DJ  Instruction Review Code  2- meets goals/outcomes      Nutrition I: Fats -Discuss the types of cholesterol, what cholesterol does to the heart, and how cholesterol levels can be controlled.   Nutrition II: Labels -Discuss the different components of food labels and how to read food label [place] from 01/02/2016 in [place]  Date  11/21/15  Educator  Russella Dar  Instruction Review Code  2- meets goals/outcomes      Heart Parts and Heart Disease -Discuss the anatomy of the heart, the pathway of blood circulation through the heart, and these are affected by heart disease.   Stress I: Signs and Symptoms -Discuss the causes of stress, how stress may lead to anxiety and depression, and ways to limit stress. Flowsheet Row CARDIAC REHAB PHASE II EXERCISE from 01/02/2016 in [place]  Date  12/05/15  Educator  Russella Dar  Instruction Review Code  2- meets  goals/outcomes      Stress II: Relaxation -Discuss different types of relaxation techniques to limit stress. Flowsheet Row CARDIAC REHAB PHASE II EXERCISE from 01/02/2016 in [place]  Date  12/12/15  Educator  Russella Dar  Instruction Review Code  2- meets goals/outcomes      Warning Signs of Stroke / TIA -Discuss definition of a stroke, what the signs and symptoms are of a stroke, and how to identify when someone is having stroke. Flowsheet Row CARDIAC REHAB PHASE II EXERCISE from 01/02/2016 in [place]  Date  12/19/15  Educator  Russella Dar  Instruction Review Code  2- meets goals/outcomes      Knowledge Questionnaire Score:     Knowledge Questionnaire Score - 10/22/15 1521      Knowledge Questionnaire Score   Pre Score 23/24      Core Components/Risk Factors/Patient Goals at Admission:     Personal Goals and Risk Factors at Admission - 10/22/15 1522      Core Components/Risk Factors/Patient Goals on Admission    Weight Management Weight Maintenance   Increase Strength and Stamina Yes   Intervention Provide advice, education, support and counseling about physical activity/exercise needs.;Develop an individualized exercise prescription for aerobic and resistive training based on initial evaluation findings, risk stratification, comorbidities and participant's personal  goals.   Expected Outcomes Achievement of increased cardiorespiratory fitness and enhanced flexibility, muscular endurance and strength shown through measurements of functional capacity and personal statement of participant.   Diabetes Yes   Intervention Provide education about signs/symptoms and action to take for hypo/hyperglycemia.   Expected Outcomes Long Term: Attainment of HbA1C < 7%.   Stress Yes   Intervention Offer individual and/or small group education and counseling on adjustment to heart disease, stress management and health-related lifestyle  change. Teach and support self-help strategies.   Expected Outcomes Short Term: Participant demonstrates changes in health-related behavior, relaxation and other stress management skills, ability to obtain effective social support, and compliance with psychotropic medications if prescribed.;Long Term: Emotional wellbeing is indicated by absence of clinically significant psychosocial distress or social isolation.   Personal Goal Other Yes   Personal Goal Patient wants to increase her strenght and stamina. She also want to make regular exercise a part of her lifestyle.     Intervention Patient will exercise 3 days/week in program and supplement with 2 days/week at home.    Expected Outcomes Patient will meet her above stated goals.       Core Components/Risk Factors/Patient Goals Review:      Goals and Risk Factor Review    Row Name 10/22/15 1526 11/09/15 1437 12/05/15 1501 01/09/16 1353       Core Components/Risk Factors/Patient Goals Review   Personal Goals Review Increase Strength and Stamina;Weight Management/Obesity;Stress Weight Management/Obesity;Increase Strength and Stamina;Other  Patient wants to increase her strenght and stamina. She also want to make regular exercise a part of her lifestyle.  Increase Strength and Stamina;Other  Patient wants exercise to be a regular part of her lifestyle.  Weight Management/Obesity;Increase Strength and Stamina;Other  Make exercise a part of her lifestyle.     Review  - After 5 sessions, patient has gained 0.5 lbs. She is doing well in the program. She says she feels better and has seen a slight improvement in her strength.  After 11 sessions, patient has lost 5 lbs. She is doing well in the program. Her strength and stamina are improved. She was hospitalized for a few days with vomiting and general malaise. She says her cardiologist discussed a internal defrillator placement. She has also missed some sessions with a URI. Her b/p continues to be low.  Her PCP is adjusting her medications.  Patient has attended 26 sessions. She has lost 5.5 lbs and her strength and stamina have increased. She says she feels better and plans to continue exercising after graduation.     Expected Outcomes  - Patient will continue to attend sessions maintaining her weight with improved strength and stamina.  Patient will continue to attend sessions and complete the program meeting the above stated goals.  Patient will complete the program meeting her personal goals.        Core Components/Risk Factors/Patient Goals at Discharge (Final Review):      Goals and Risk Factor Review - 01/09/16 1353      Core Components/Risk Factors/Patient Goals Review   Personal Goals Review Weight Management/Obesity;Increase Strength and Stamina;Other  Make exercise a part of her lifestyle.    Review Patient has attended 26 sessions. She has lost 5.5 lbs and her strength and stamina have increased. She says she feels better and plans to continue exercising after graduation.    Expected Outcomes Patient will complete the program meeting her personal goals.       ITP Comments:  ITP Comments    Row Name 11/01/15 0900           ITP Comments Patient met with Registered Dietitian to discuss nutrition topics including: Heart healthty eating, heart healthy cooking and making smart choices when shopping; Portion control; weight management; and hydration. He also attended a class with hospital chaplian called [facility] to discuss how his diagnosis has impacted his life.          Comments: .ITP 30 Day REVIEW Patient doing well with the program. Will continue to monitor for progress.

## 2016-01-09 NOTE — Telephone Encounter (Signed)
Per Dr. Elberta Fortis ICD implant needs to be rescheduled.  Dr. Elberta Fortis can do procedure on 11/29.  I spoke with pt and procedure moved to 01/23/16 at 2:30.  Pt aware to arrive at 12:30

## 2016-01-11 ENCOUNTER — Encounter (HOSPITAL_COMMUNITY): Payer: BLUE CROSS/BLUE SHIELD

## 2016-01-11 ENCOUNTER — Telehealth: Payer: Self-pay | Admitting: Cardiology

## 2016-01-11 NOTE — Telephone Encounter (Signed)
Calling stating that she has had diarrhea for a couple of days.  States she has been going about once every hour or so.  No nausea or abdominal cramping.  States she has some kaopectate but says if on blood thinner not to use.  Suggested she try Imodium OTC and take as directed.  She will try to use that to see if it helps.  States she thinks she and her husband (who is also having some stomach issues) that they may have eaten something that has caused the diarrhea.

## 2016-01-11 NOTE — Telephone Encounter (Signed)
New message    Pt verbalized that she has diahrea and she wants to know if she can take pepto bismol

## 2016-01-14 ENCOUNTER — Encounter (HOSPITAL_COMMUNITY): Payer: BLUE CROSS/BLUE SHIELD

## 2016-01-16 ENCOUNTER — Encounter (HOSPITAL_COMMUNITY): Payer: BLUE CROSS/BLUE SHIELD

## 2016-01-16 ENCOUNTER — Encounter (HOSPITAL_COMMUNITY)
Admission: RE | Admit: 2016-01-16 | Discharge: 2016-01-16 | Disposition: A | Discharge status: Home / Self Care | Payer: BLUE CROSS/BLUE SHIELD | Source: Ambulatory Visit | Attending: Cardiovascular Disease | Admitting: Cardiovascular Disease

## 2016-01-16 DIAGNOSIS — I213 ST elevation (STEMI) myocardial infarction of unspecified site: Secondary | ICD-10-CM | POA: Diagnosis not present

## 2016-01-16 NOTE — Progress Notes (Addendum)
Daily Session Note  Patient Details  Name: Catherine Mays MRN: [id number] Date of Birth: 1965-10-29 Referring Provider:    Encounter Date: 01/16/2016  Check In:     Session Check In - 01/16/16 1545      Check-In   Location [facility]   Staff Present Suzanne Boron, BS, EP, Exercise Physiologist;Cheryl Stabenow Wynetta Emery, RN, BSN   Supervising physician immediately available to respond to emergencies See telemetry face sheet for immediately available MD   Medication changes reported     No   Fall or balance concerns reported    No   Warm-up and Cool-down Performed as group-led instruction   Resistance Training Performed Yes   VAD Patient? No     Pain Assessment   Currently in Pain? No/denies   Pain Score 0-No pain   Multiple Pain Sites No      Capillary Blood Glucose: No results found for this or any previous visit (from the past 24 hour(s)).   Goals Met:  Independence with exercise equipment Exercise tolerated well No report of cardiac concerns or symptoms Strength training completed today  Goals Unmet:  Not Applicable  Comments: Check out 1645.   Dr. Kate Sable is Medical Director for [facility].

## 2016-01-18 ENCOUNTER — Encounter (HOSPITAL_COMMUNITY): Payer: BLUE CROSS/BLUE SHIELD

## 2016-01-21 ENCOUNTER — Encounter (HOSPITAL_COMMUNITY): Payer: BLUE CROSS/BLUE SHIELD

## 2016-01-23 ENCOUNTER — Ambulatory Visit (HOSPITAL_COMMUNITY)
Admission: RE | Admit: 2016-01-23 | Discharge: 2016-01-24 | Disposition: A | Discharge status: Home / Self Care | Payer: BLUE CROSS/BLUE SHIELD | Source: Ambulatory Visit | Attending: Cardiology | Admitting: Cardiology

## 2016-01-23 ENCOUNTER — Ambulatory Visit: Payer: BLUE CROSS/BLUE SHIELD

## 2016-01-23 ENCOUNTER — Encounter (HOSPITAL_COMMUNITY)
Admission: RE | Disposition: A | Discharge status: Home / Self Care | Payer: Self-pay | Source: Ambulatory Visit | Attending: Cardiology

## 2016-01-23 DIAGNOSIS — Z95818 Presence of other cardiac implants and grafts: Secondary | ICD-10-CM

## 2016-01-23 DIAGNOSIS — I2510 Atherosclerotic heart disease of native coronary artery without angina pectoris: Secondary | ICD-10-CM | POA: Diagnosis not present

## 2016-01-23 DIAGNOSIS — I340 Nonrheumatic mitral (valve) insufficiency: Secondary | ICD-10-CM | POA: Insufficient documentation

## 2016-01-23 DIAGNOSIS — I255 Ischemic cardiomyopathy: Secondary | ICD-10-CM | POA: Insufficient documentation

## 2016-01-23 DIAGNOSIS — I480 Paroxysmal atrial fibrillation: Secondary | ICD-10-CM | POA: Insufficient documentation

## 2016-01-23 DIAGNOSIS — I5022 Chronic systolic (congestive) heart failure: Secondary | ICD-10-CM | POA: Diagnosis not present

## 2016-01-23 DIAGNOSIS — Z006 Encounter for examination for normal comparison and control in clinical research program: Secondary | ICD-10-CM | POA: Diagnosis not present

## 2016-01-23 DIAGNOSIS — I252 Old myocardial infarction: Secondary | ICD-10-CM | POA: Diagnosis not present

## 2016-01-23 DIAGNOSIS — I509 Heart failure, unspecified: Secondary | ICD-10-CM

## 2016-01-23 HISTORY — PX: EP IMPLANTABLE DEVICE: SHX172B

## 2016-01-23 LAB — SURGICAL PCR SCREEN
MRSA, PCR: NEGATIVE
Staphylococcus aureus: NEGATIVE

## 2016-01-23 SURGERY — ICD IMPLANT
Anesthesia: LOCAL

## 2016-01-23 MED ORDER — FENTANYL CITRATE (PF) 100 MCG/2ML IJ SOLN
INTRAMUSCULAR | Status: DC | PRN
  Administered 2016-01-23 (×2): 25 ug via INTRAVENOUS

## 2016-01-23 MED ORDER — LIDOCAINE HCL (PF) 1 % IJ SOLN
INTRAMUSCULAR | Status: DC | PRN
  Administered 2016-01-23: 50 mL via INTRADERMAL

## 2016-01-23 MED ORDER — FLUTICASONE PROPIONATE 50 MCG/ACT NA SUSP: 2.0000 | Freq: Every day | NASAL | Status: DC | PRN

## 2016-01-23 MED ORDER — FENTANYL CITRATE (PF) 100 MCG/2ML IJ SOLN
INTRAMUSCULAR | Status: AC
Start: 2016-01-23 — End: 2016-01-23
  Filled 2016-01-23: qty 2

## 2016-01-23 MED ORDER — HYDROCODONE-ACETAMINOPHEN 5-325 MG PO TABS
1.0000 | ORAL_TABLET | Freq: Once | ORAL | Status: AC
  Administered 2016-01-23: 1 via ORAL
  Filled 2016-01-23: qty 1

## 2016-01-23 MED ORDER — SODIUM CHLORIDE 0.9 % IR SOLN
80.0000 mg | Status: AC
  Administered 2016-01-23: 80 mg

## 2016-01-23 MED ORDER — TICAGRELOR 90 MG PO TABS
90.0000 mg | ORAL_TABLET | Freq: Two times a day (BID) | ORAL | Status: DC
Start: 2016-01-23 — End: 2016-01-24
  Administered 2016-01-23 – 2016-01-24 (×2): 90 mg via ORAL
  Filled 2016-01-23 (×2): qty 1

## 2016-01-23 MED ORDER — SODIUM CHLORIDE 0.9 % IR SOLN
Status: AC
  Filled 2016-01-23: qty 2

## 2016-01-23 MED ORDER — HEPARIN (PORCINE) IN NACL 2-0.9 UNIT/ML-% IJ SOLN
INTRAMUSCULAR | Status: DC | PRN
  Administered 2016-01-23: 15:00:00

## 2016-01-23 MED ORDER — LIDOCAINE HCL (PF) 1 % IJ SOLN
INTRAMUSCULAR | Status: AC
  Filled 2016-01-23: qty 30

## 2016-01-23 MED ORDER — LORATADINE 10 MG PO TABS
10.0000 mg | ORAL_TABLET | Freq: Every day | ORAL | Status: DC
  Administered 2016-01-24: 10 mg via ORAL
  Filled 2016-01-23: qty 1

## 2016-01-23 MED ORDER — ONDANSETRON HCL 4 MG/2ML IJ SOLN
4.0000 mg | Freq: Four times a day (QID) | INTRAMUSCULAR | Status: DC | PRN
  Administered 2016-01-23 – 2016-01-24 (×2): 4 mg via INTRAVENOUS
  Filled 2016-01-23 (×3): qty 2

## 2016-01-23 MED ORDER — RIVAROXABAN 15 MG PO TABS
15.0000 mg | ORAL_TABLET | Freq: Every day | ORAL | Status: DC
Start: 2016-01-23 — End: 2016-01-24
  Administered 2016-01-23: 15 mg via ORAL
  Filled 2016-01-23: qty 1

## 2016-01-23 MED ORDER — VANCOMYCIN HCL IN DEXTROSE 1-5 GM/200ML-% IV SOLN
INTRAVENOUS | Status: AC
  Filled 2016-01-23: qty 200

## 2016-01-23 MED ORDER — LEVOTHYROXINE SODIUM 25 MCG PO TABS
125.0000 ug | ORAL_TABLET | Freq: Every morning | ORAL | Status: DC
  Administered 2016-01-24: 125 ug via ORAL
  Filled 2016-01-23: qty 1

## 2016-01-23 MED ORDER — VANCOMYCIN HCL IN DEXTROSE 1-5 GM/200ML-% IV SOLN
1000.0000 mg | INTRAVENOUS | Status: AC
  Administered 2016-01-23: 1000 mg via INTRAVENOUS

## 2016-01-23 MED ORDER — LOSARTAN POTASSIUM 25 MG PO TABS
12.5000 mg | ORAL_TABLET | Freq: Every day | ORAL | Status: DC
  Filled 2016-01-23: qty 1

## 2016-01-23 MED ORDER — MUPIROCIN 2 % EX OINT
1.0000 "application " | TOPICAL_OINTMENT | Freq: Once | CUTANEOUS | Status: AC
  Administered 2016-01-23: 1 via TOPICAL
  Filled 2016-01-23: qty 22

## 2016-01-23 MED ORDER — TRIAMCINOLONE ACETONIDE 55 MCG/ACT NA AERO: 2.0000 | INHALATION_SPRAY | Freq: Every day | NASAL | Status: DC

## 2016-01-23 MED ORDER — SODIUM CHLORIDE 0.9 % IV SOLN
INTRAVENOUS | Status: DC
  Administered 2016-01-23: 14:00:00 via INTRAVENOUS

## 2016-01-23 MED ORDER — FUROSEMIDE 40 MG PO TABS
40.0000 mg | ORAL_TABLET | Freq: Every day | ORAL | Status: DC
  Administered 2016-01-24: 40 mg via ORAL
  Filled 2016-01-23: qty 1

## 2016-01-23 MED ORDER — MIDAZOLAM HCL 5 MG/5ML IJ SOLN
INTRAMUSCULAR | Status: AC
  Filled 2016-01-23: qty 5

## 2016-01-23 MED ORDER — TRAMADOL HCL 50 MG PO TABS
50.0000 mg | ORAL_TABLET | Freq: Two times a day (BID) | ORAL | Status: DC | PRN
  Administered 2016-01-23 – 2016-01-24 (×2): 50 mg via ORAL
  Filled 2016-01-23 (×2): qty 1

## 2016-01-23 MED ORDER — VANCOMYCIN HCL IN DEXTROSE 1-5 GM/200ML-% IV SOLN
1000.0000 mg | Freq: Two times a day (BID) | INTRAVENOUS | Status: AC
  Administered 2016-01-24: 1000 mg via INTRAVENOUS
  Filled 2016-01-23: qty 200

## 2016-01-23 MED ORDER — MUPIROCIN 2 % EX OINT
TOPICAL_OINTMENT | CUTANEOUS | Status: AC
  Administered 2016-01-23: 1 via TOPICAL
  Filled 2016-01-23: qty 22

## 2016-01-23 MED ORDER — MIDAZOLAM HCL 5 MG/5ML IJ SOLN
INTRAMUSCULAR | Status: DC | PRN
  Administered 2016-01-23 (×3): 1 mg via INTRAVENOUS

## 2016-01-23 MED ORDER — ATORVASTATIN CALCIUM 80 MG PO TABS
80.0000 mg | ORAL_TABLET | Freq: Every day | ORAL | Status: DC
  Administered 2016-01-23: 80 mg via ORAL
  Filled 2016-01-23: qty 1

## 2016-01-23 MED ORDER — PANTOPRAZOLE SODIUM 40 MG PO TBEC
40.0000 mg | DELAYED_RELEASE_TABLET | Freq: Every day | ORAL | Status: DC
  Administered 2016-01-24: 40 mg via ORAL
  Filled 2016-01-23: qty 1

## 2016-01-23 MED ORDER — POTASSIUM CHLORIDE CRYS ER 20 MEQ PO TBCR
60.0000 meq | EXTENDED_RELEASE_TABLET | Freq: Every day | ORAL | Status: DC
  Administered 2016-01-24: 60 meq via ORAL
  Filled 2016-01-23: qty 3

## 2016-01-23 MED ORDER — NITROGLYCERIN 0.4 MG SL SUBL
0.4000 mg | SUBLINGUAL_TABLET | SUBLINGUAL | Status: DC | PRN
Start: 2016-01-23 — End: 2016-01-24

## 2016-01-23 MED ORDER — TRAZODONE HCL 50 MG PO TABS: 25.0000 mg | ORAL_TABLET | Freq: Every evening | ORAL | Status: DC | PRN

## 2016-01-23 MED ORDER — ACETAMINOPHEN 325 MG PO TABS
325.0000 mg | ORAL_TABLET | ORAL | Status: DC | PRN
  Administered 2016-01-23: 650 mg via ORAL
  Filled 2016-01-23: qty 2

## 2016-01-23 MED ORDER — CARVEDILOL 3.125 MG PO TABS
3.1250 mg | ORAL_TABLET | Freq: Two times a day (BID) | ORAL | Status: DC
  Administered 2016-01-23 – 2016-01-24 (×2): 3.125 mg via ORAL
  Filled 2016-01-23 (×2): qty 1

## 2016-01-23 MED ORDER — HEPARIN (PORCINE) IN NACL 2-0.9 UNIT/ML-% IJ SOLN
INTRAMUSCULAR | Status: AC
  Filled 2016-01-23: qty 500

## 2016-01-23 MED ORDER — HYDROMORPHONE HCL 1 MG/ML IJ SOLN
1.0000 mg | Freq: Once | INTRAMUSCULAR | Status: AC
  Administered 2016-01-24: 1 mg via INTRAVENOUS
  Filled 2016-01-23: qty 1

## 2016-01-23 SURGICAL SUPPLY — 6 items
CABLE SURGICAL S-101-97-12 (CABLE) ×2 IMPLANT
ICD ELLIPSE VR CD1411-36Q (ICD Generator) ×2 IMPLANT
LEAD DURATA 7122Q-65CM (Lead) ×2 IMPLANT
PAD DEFIB LIFELINK (PAD) ×2 IMPLANT
SHEATH CLASSIC 7F (SHEATH) ×2 IMPLANT
TRAY PACEMAKER INSERTION (PACKS) ×2 IMPLANT

## 2016-01-23 NOTE — Progress Notes (Signed)
Orthopedic Tech Progress Note Patient Details:  Catherine Mays 05-Nov-1965 [id number] Patient already has arm sling. Patient ID: Catherine Mays, female   DOB: 1965/09/22, 50 y.o.   MRN: [id number]   Catherine Mays 01/23/2016, 9:03 PM

## 2016-01-23 NOTE — H&P (Signed)
ICD Criteria  Current LVEF:20-35%. Within 12 months prior to implant: Yes   Heart failure history: Yes, Class II  Cardiomyopathy history: Yes, Ischemic Cardiomyopathy - Prior MI.  Atrial Fibrillation/Atrial Flutter: No.  Ventricular tachycardia history: No.  Cardiac arrest history: No.  History of syndromes with risk of sudden death: No.  Previous ICD: No.  Current ICD indication: Primary  PPM indication: No.   Class I or II Bradycardia indication present: No  Beta Blocker therapy for 3 or more months: Yes, prescribed.   Ace Inhibitor/ARB therapy for 3 or more months: Yes, prescribed.    Catherine Mays is a 50 y.o. female who is s/p STEMI with a decreased EF. She presents today for ICD placement.  On exam, regular rhythm, no murmurs, lungs clear.  Risks and benefits discussed. Risks include but not limited to bleeding, infection, tamponade, pneumothorax.  The patient understands the risks and has agreed to the procedure.  Zurii Hewes Elberta Fortis, MD 01/23/2016 12:40 PM

## 2016-01-24 ENCOUNTER — Ambulatory Visit (HOSPITAL_COMMUNITY): Payer: BLUE CROSS/BLUE SHIELD

## 2016-01-24 ENCOUNTER — Encounter (HOSPITAL_COMMUNITY): Payer: Self-pay | Admitting: Cardiology

## 2016-01-24 DIAGNOSIS — I5022 Chronic systolic (congestive) heart failure: Secondary | ICD-10-CM | POA: Diagnosis not present

## 2016-01-24 DIAGNOSIS — Z006 Encounter for examination for normal comparison and control in clinical research program: Secondary | ICD-10-CM | POA: Diagnosis not present

## 2016-01-24 DIAGNOSIS — I255 Ischemic cardiomyopathy: Secondary | ICD-10-CM | POA: Diagnosis not present

## 2016-01-24 DIAGNOSIS — I340 Nonrheumatic mitral (valve) insufficiency: Secondary | ICD-10-CM | POA: Diagnosis not present

## 2016-01-24 MED ORDER — ONDANSETRON HCL 4 MG PO TABS: 4.0000 mg | ORAL_TABLET | Freq: Three times a day (TID) | ORAL | 0 refills | Status: DC | PRN

## 2016-01-24 MED ORDER — RIVAROXABAN 15 MG PO TABS: 15.0000 mg | ORAL_TABLET | Freq: Every day | ORAL | 6 refills | Status: DC

## 2016-01-24 MED ORDER — OXYCODONE HCL 10 MG PO TABS: 10.0000 mg | ORAL_TABLET | Freq: Four times a day (QID) | ORAL | 0 refills | Status: DC | PRN

## 2016-01-24 MED ORDER — OXYCODONE HCL 5 MG PO TABS
10.0000 mg | ORAL_TABLET | ORAL | Status: DC | PRN
  Administered 2016-01-24 (×3): 10 mg via ORAL
  Filled 2016-01-24 (×3): qty 2

## 2016-01-24 NOTE — Discharge Summary (Signed)
ELECTROPHYSIOLOGY PROCEDURE DISCHARGE SUMMARY    Patient ID: Catherine MeekerDeborah Mays,  MRN: [id number], DOB/AGE: April 02, 1965 50 y.o.  Admit date: 01/23/2016 Discharge date: 01/24/2016  Primary Care Physician: Dwana MelenaZack Hall, MD Primary Cardiologist: Clifton JamesMcAlhany Heart Failure: Shirlee LatchMcLean Electrophysiologist: Elberta Fortisamnitz  Primary Discharge Diagnosis:  Ischemic cardiomyopathy status post ICD implant this admission   Secondary Discharge Diagnosis:  1.  Chronic systolic heart failure 2.  Moderate to severe MR 3.  Paroxysmal atrial fibrillation  Allergies  Allergen Reactions  . Paxil [Paroxetine] Palpitations  . Spironolactone Other (See Comments)    Kidney problems  . Morphine And Related Nausea And Vomiting  . Percocet [Oxycodone-Acetaminophen] Nausea And Vomiting  . Cefdinir Nausea Only  . Zolpidem Other (See Comments)    Doesn't work for patient at all     Procedures This Admission:  1.  Implantation of a STJ single chamber ICD on 01/23/16 by Dr Elberta Fortisamnitz.  The patient received a STJ model number Ellipse VR ICD with model number 7122Q right ventricular lead.  DFT's were deferred at time of implant.  There were no immediate post procedure complications. 2.  CXR on 01/24/16 demonstrated no pneumothorax status post device implantation.   Brief HPI: Catherine MeekerDeborah Mays is a 50 y.o. female was referred to electrophysiology in the outpatient setting for consideration of ICD implantation.  Past medical history includes ischemic cardiomyopathy, and chronic systolic heart failure.  The patient has persistent LV dysfunction despite guideline directed therapy.  Risks, benefits, and alternatives to ICD implantation were reviewed with the patient who wished to proceed.   Hospital Course:  The patient was admitted and underwent implantation of a STJ single chamber ICD with details as outlined above. She was monitored on telemetry overnight which demonstrated sinus rhythm.  Left chest was without hematoma or  ecchymosis.  The device was interrogated and found to be functioning normally.  CXR was obtained and demonstrated no pneumothorax status post device implantation.  Wound care, arm mobility, and restrictions were reviewed with the patient.  The patient was examined and considered stable for discharge to home.   The patient's discharge medications include an ARB (Losartan) and beta blocker (Coreg).   With CrCl now 55, Xarelto dose should be 20mg  daily. With fresh ICD pocket and use of Brilinta, I did not change this dose today. I have sent message to Dr Elberta Fortisamnitz and Dr Shirlee LatchMcLean who is seeing her in follow up in the next 10 days to consider increasing Xarelto dose.   Physical Exam: Vitals:   01/23/16 2015 01/23/16 2107 01/23/16 2253 01/24/16 0605  BP: 94/62 96/71 105/71 104/70  Pulse: 65 71 72 68  Resp: 18 18  18   Temp: 98.3 F (36.8 C) 97.7 F (36.5 C)  97.7 F (36.5 C)  TempSrc: Oral Oral Oral Oral  SpO2: 100%   98%  Weight:    147 lb (66.7 kg)  Height:        GEN- The patient is well appearing, alert and oriented x 3 today.   HEENT: normocephalic, atraumatic; sclera clear, conjunctiva pink; hearing intact; oropharynx clear; neck supple  Lungs- Clear to ausculation bilaterally, normal work of breathing.  No wheezes, rales, rhonchi Heart- Regular rate and rhythm  GI- soft, non-tender, non-distended, bowel sounds present  Extremities- no clubbing, cyanosis, or edema; DP/PT/radial pulses 2+ bilaterally MS- no significant deformity or atrophy Skin- warm and dry, no rash or lesion, left chest without hematoma/ecchymosis Psych- euthymic mood, full affect Neuro- strength and sensation are intact   Labs:  Lab Results  Component Value Date   WBC 7.0 01/08/2016   HGB 10.4 (L) 01/08/2016   HCT 32.5 (L) 01/08/2016   MCV 93.1 01/08/2016   PLT 232 01/08/2016   No results for input(s): NA, K, CL, CO2, BUN, CREATININE, CALCIUM, PROT, BILITOT, ALKPHOS, ALT, AST, GLUCOSE in the last 168  hours.  Invalid input(s): LABALBU  Discharge Medications:    Medication List    TAKE these medications   atorvastatin 80 MG tablet Commonly known as:  LIPITOR Take 1 tablet (80 mg total) by mouth daily at 6 PM.   CALCIUM + D3 PO Take 1 tablet by mouth every morning.   carvedilol 3.125 MG tablet Commonly known as:  COREG Take 1 tablet (3.125 mg total) by mouth 2 (two) times daily with a meal.   fexofenadine 180 MG tablet Commonly known as:  ALLEGRA Take 180 mg by mouth daily.   furosemide 40 MG tablet Commonly known as:  LASIX Take 1 tablet (40 mg total) by mouth daily.   losartan 25 MG tablet Commonly known as:  COZAAR Take 0.5 tablets (12.5 mg total) by mouth at bedtime.   NASACORT ALLERGY 24HR 55 MCG/ACT Aero nasal inhaler Generic drug:  triamcinolone Place 2 sprays into the nose daily.   nitroGLYCERIN 0.4 MG SL tablet Commonly known as:  NITROSTAT Place 1 tablet (0.4 mg total) under the tongue every 5 (five) minutes as needed for chest pain.   ondansetron 4 MG tablet Commonly known as:  ZOFRAN Take 1 tablet (4 mg total) by mouth every 8 (eight) hours as needed for nausea or vomiting.   Oxycodone HCl 10 MG Tabs Take 1 tablet (10 mg total) by mouth every 6 (six) hours as needed for severe pain.   pantoprazole 40 MG tablet Commonly known as:  PROTONIX Take 1 tablet (40 mg total) by mouth daily.   potassium chloride SA 20 MEQ tablet Commonly known as:  K-DUR,KLOR-CON Take 3 tablets (60 mEq total) by mouth daily.   Rivaroxaban 15 MG Tabs tablet Commonly known as:  XARELTO Take 1 tablet (15 mg total) by mouth daily with supper. Resume evening of 01/25/16 What changed:  additional instructions   SYNTHROID 125 MCG tablet Generic drug:  levothyroxine Take 125 mcg by mouth every morning.   ticagrelor 90 MG Tabs tablet Commonly known as:  BRILINTA Take 1 tablet (90 mg total) by mouth 2 (two) times daily.   traZODone 50 MG tablet Commonly known as:   DESYREL Take 0.5 tablets (25 mg total) by mouth at bedtime as needed for sleep.       Disposition:  Discharge Instructions    Diet - low sodium heart healthy    Complete by:  As directed    Increase activity slowly    Complete by:  As directed      Follow-up Information    [facility] Follow up on 02/04/2016.   Specialty:  Cardiology Why:  at 4:30PM for wound check  Contact information: [address] [place] [address] [phone]       Starr Urias Jorja Loa, MD Follow up on 04/25/2016.   Specialty:  Cardiology Why:  at [facility] information: [address] [place] [address] [phone]           Duration of Discharge Encounter: Greater than 30 minutes including physician time.  Signed, Gypsy Balsam, NP 01/24/2016 10:36 AM  I have seen and examined this patient with  [employer]  Agree with above, note added to reflect my findings.  On exam, regular rhythm, no murmurs, lungs clear. Had ICD placed for ischemic cardiomyopathy. Tolerated procedure well. CXR and interrogation without abnormalities.  Has been having shoulder pain.  On exam, improved with massaging muscles of the shoulder.  Likely the pain due to pressure dressing in place.  Plan for discharge today with follow up in device clinic.    Nil Bolser M. Marquice Uddin MD 01/24/2016 2:26 PM

## 2016-01-24 NOTE — Discharge Instructions (Signed)
Supplemental Discharge Instructions for  Pacemaker/Defibrillator Patients  Activity No heavy lifting or vigorous activity with your left/right arm for 6 to 8 weeks.  Do not raise your left/right arm above your head for one week.  Gradually raise your affected arm as drawn below.           __        01/28/16                01/29/16                      01/30/16                    01/31/16  NO DRIVING for    1 week  ; you may begin driving on 40/9/81    .  WOUND CARE - Keep the wound area clean and dry.  Do not get this area wet for one week. No showers for one week; you may shower on   01/31/16  . - The tape/steri-strips on your wound will fall off; do not pull them off.  No bandage is needed on the site.  DO  NOT apply any creams, oils, or ointments to the wound area. - If you notice any drainage or discharge from the wound, any swelling or bruising at the site, or you develop a fever > 101? F after you are discharged home, call the office at once.  Special Instructions - You are still able to use cellular telephones; use the ear opposite the side where you have your pacemaker/defibrillator.  Avoid carrying your cellular phone near your device. - When traveling through airports, show security personnel your identification card to avoid being screened in the metal detectors.  Ask the security personnel to use the hand wand. - Avoid arc welding equipment, MRI testing (magnetic resonance imaging), TENS units (transcutaneous nerve stimulators).  Call the office for questions about other devices. - Avoid electrical appliances that are in poor condition or are not properly grounded. - Microwave ovens are safe to be near or to operate.  Additional information for defibrillator patients should your device go off: - If your device goes off ONCE and you feel fine afterward, notify the device clinic nurses. - If your device goes off ONCE and you do not feel well afterward, call 911. - If your device  goes off TWICE, call 911. - If your device goes off THREE times in one day, call 911.  DO NOT DRIVE YOURSELF OR A FAMILY MEMBER WITH A DEFIBRILLATOR TO THE HOSPITAL--CALL 911.  --------------------------------------------------------------------------------------------------------------------  Information on my medicine - XARELTO (Rivaroxaban)  This medication education was reviewed with me or my healthcare representative as part of my discharge preparation.  The pharmacist that spoke with me during my hospital stay was:  Dennie Fetters, [facility]  Why was Xarelto prescribed for you? Xarelto was prescribed for you to reduce the risk of a blood clot forming that can cause a stroke if you have a medical condition called atrial fibrillation (a type of irregular heartbeat).  What do you need to know about xarelto ? Take your Xarelto ONCE DAILY at the same time every day with your evening meal. If you have difficulty swallowing the tablet whole, you may crush it and mix in applesauce just prior to taking your dose.  Take Xarelto exactly as prescribed by your doctor and DO NOT stop taking Xarelto without talking to the doctor who prescribed  the medication.  Stopping without other stroke prevention medication to take the place of Xarelto may increase your risk of developing a clot that causes a stroke.  Refill your prescription before you run out.  After discharge, you should have regular check-up appointments with your healthcare provider that is prescribing your Xarelto.  In the future your dose may need to be changed if your kidney function or weight changes by a significant amount.  What do you do if you miss a dose? If you are taking Xarelto ONCE DAILY and you miss a dose, take it as soon as you remember on the same day then continue your regularly scheduled once daily regimen the next day. Do not take two doses of Xarelto at the same time or on the same day.   Important Safety  Information A possible side effect of Xarelto is bleeding. You should call your healthcare provider right away if you experience any of the following: ? Bleeding from an injury or your nose that does not stop. ? Unusual colored urine (red or dark brown) or unusual colored stools (red or black). ? Unusual bruising for unknown reasons. ? A serious fall or if you hit your head (even if there is no bleeding).  Some medicines may interact with Xarelto and might increase your risk of bleeding while on Xarelto. To help avoid this, consult your healthcare provider or pharmacist prior to using any new prescription or non-prescription medications, including herbals, vitamins, non-steroidal anti-inflammatory drugs (NSAIDs) and supplements.  This website has more information on Xarelto: VisitDestination.com.[email or web address].xarelto.com.

## 2016-01-24 NOTE — Progress Notes (Signed)
Pt has been discharged home with husband. IV and telemetry box removed. Pt and pt's husband received discharge instructions and all questions were answered. Pt left with all of her belongings. Pt was instructed to get prescriptions from pharmacy; pt verbalized understanding. Pt left the unit via wheelchair and was accompanied by a Ferne Coe and pt's husband. Pt was in no distress at time of discharge.   Berdine Dance BSN, RN

## 2016-02-04 ENCOUNTER — Encounter: Payer: Self-pay | Admitting: *Deleted

## 2016-02-04 ENCOUNTER — Ambulatory Visit (INDEPENDENT_AMBULATORY_CARE_PROVIDER_SITE_OTHER): Payer: BLUE CROSS/BLUE SHIELD | Admitting: *Deleted

## 2016-02-04 DIAGNOSIS — I255 Ischemic cardiomyopathy: Secondary | ICD-10-CM | POA: Diagnosis not present

## 2016-02-04 DIAGNOSIS — Z95810 Presence of automatic (implantable) cardiac defibrillator: Secondary | ICD-10-CM

## 2016-02-04 LAB — CUP PACEART INCLINIC DEVICE CHECK
Brady Statistic RV Percent Paced: 0 %
Date Time Interrogation Session: 20171211174927
HighPow Impedance: 58.5 Ohm
Implantable Lead Implant Date: 20171129
Implantable Lead Location: 753860
Implantable Pulse Generator Implant Date: 20171129
Lead Channel Impedance Value: 400 Ohm
Lead Channel Pacing Threshold Amplitude: 1 V
Lead Channel Pacing Threshold Pulse Width: 0.5 ms
Lead Channel Sensing Intrinsic Amplitude: 12 mV
Lead Channel Setting Pacing Amplitude: 3.5 V
Lead Channel Setting Pacing Pulse Width: 0.5 ms
Lead Channel Setting Sensing Sensitivity: 0.5 mV
Pulse Gen Serial Number: 7377983

## 2016-02-04 NOTE — Progress Notes (Signed)
Cardiac Individual Treatment Plan  Patient Details  Name: Catherine Mays MRN: [id number] Date of Birth: 1965/04/08 Referring Provider:    Initial Encounter Date:  [facility] PHASE II ORIENTATION from 10/22/2015 in [facility]  Date  10/22/15      Visit Diagnosis: ST elevation myocardial infarction (STEMI), unspecified artery ([place])  Patient's Home Medications on Admission:  Current Outpatient Prescriptions:  .  atorvastatin (LIPITOR) 80 MG tablet, Take 1 tablet (80 mg total) by mouth daily at 6 PM., Disp: 30 tablet, Rfl: 11 .  Calcium Carb-Cholecalciferol (CALCIUM + D3 PO), Take 1 tablet by mouth every morning., Disp: , Rfl:  .  carvedilol (COREG) 3.125 MG tablet, Take 1 tablet (3.125 mg total) by mouth 2 (two) times daily with a meal., Disp: 60 tablet, Rfl: 6 .  fexofenadine (ALLEGRA) 180 MG tablet, Take 180 mg by mouth daily., Disp: , Rfl:  .  furosemide (LASIX) 40 MG tablet, Take 1 tablet (40 mg total) by mouth daily., Disp: 30 tablet, Rfl: 6 .  losartan (COZAAR) 25 MG tablet, Take 0.5 tablets (12.5 mg total) by mouth at bedtime., Disp: 15 tablet, Rfl: 3 .  nitroGLYCERIN (NITROSTAT) 0.4 MG SL tablet, Place 1 tablet (0.4 mg total) under the tongue every 5 (five) minutes as needed for chest pain., Disp: 25 tablet, Rfl: 3 .  ondansetron (ZOFRAN) 4 MG tablet, Take 1 tablet (4 mg total) by mouth every 8 (eight) hours as needed for nausea or vomiting., Disp: 20 tablet, Rfl: 0 .  oxyCODONE 10 MG TABS, Take 1 tablet (10 mg total) by mouth every 6 (six) hours as needed for severe pain., Disp: 12 tablet, Rfl: 0 .  pantoprazole (PROTONIX) 40 MG tablet, Take 1 tablet (40 mg total) by mouth daily., Disp: 30 tablet, Rfl: 11 .  potassium chloride SA (K-DUR,KLOR-CON) 20 MEQ tablet, Take 3 tablets (60 mEq total) by mouth daily., Disp: 90 tablet, Rfl: 6 .  Rivaroxaban (XARELTO) 15 MG TABS tablet, Take 1 tablet (15 mg total) by mouth daily with supper. Resume evening  of 01/25/16, Disp: 30 tablet, Rfl: 6 .  SYNTHROID 125 MCG tablet, Take 125 mcg by mouth every morning., Disp: , Rfl: 5 .  ticagrelor (BRILINTA) 90 MG TABS tablet, Take 1 tablet (90 mg total) by mouth 2 (two) times daily., Disp: 180 tablet, Rfl: 3 .  traZODone (DESYREL) 50 MG tablet, Take 0.5 tablets (25 mg total) by mouth at bedtime as needed for sleep., Disp: 30 tablet, Rfl: 0 .  triamcinolone (NASACORT ALLERGY 24HR) 55 MCG/ACT AERO nasal inhaler, Place 2 sprays into the nose daily., Disp: , Rfl:  No current facility-administered medications for this encounter.   Facility-Administered Medications Ordered in Other Encounters:  .  bivalirudin (ANGIOMAX) 250 mg in sodium chloride 0.9 % 50 mL (5 mg/mL) infusion, , , Continuous PRN, Burnell Blanks, MD, Stopped at 09/20/15 [phone] .  bivalirudin (ANGIOMAX) BOLUS via infusion, , , PRN, Burnell Blanks, MD, 54.45 mg at 09/20/15 6283 .  fentaNYL (SUBLIMAZE) injection, , , PRN, Burnell Blanks, MD, 25 mcg at 09/20/15 0651 .  heparin 1,500 mL, , , PRN, Burnell Blanks, MD .  heparin infusion 2 units/mL in 0.9 % sodium chloride, , , Continuous PRN, Burnell Blanks, MD, 1,500 mL at 09/20/15 0733 .  iopamidol (ISOVUE-370) 76 % injection, , , PRN, Burnell Blanks, MD, 165 mL at 09/20/15 0733 .  lidocaine (PF) (XYLOCAINE) 1 % injection, , , PRN, Annita Brod  McAlhany, MD, 2 mL at 09/20/15 5956 .  midazolam (VERSED) injection, , , PRN, Burnell Blanks, MD, 1 mg at 09/20/15 0651 .  nitroGLYCERIN 1 mg/10 ml (100 mcg/ml) - IR/CATH LAB, , , PRN, Burnell Blanks, MD, 200 mcg at 09/20/15 3875 .  Radial Cocktail/Verapamil only, , , PRN, Burnell Blanks, MD, 10 mL at 09/20/15 0629 .  ticagrelor (BRILINTA) tablet, , , PRN, Burnell Blanks, MD, 180 mg at 09/20/15 [phone] .  tirofiban (AGGRASTAT) bolus via infusion, , , PRN, Burnell Blanks, MD, 1,815 mcg at 09/20/15 [phone] .  tirofiban (AGGRASTAT)  infusion 50 mcg/mL 100 mL, , , Continuous PRN, Burnell Blanks, MD, Last Rate: 13.1 mL/hr at 09/20/15 0651, 0.15 mcg/kg/min at 09/20/15 8841  Past Medical History: Past Medical History:  Diagnosis Date  . CAD in native artery   . Chronic systolic CHF (congestive heart failure) ([place])   . Hypotension    a. preventing med titration for HF.  [facility] Hypothyroidism 09/24/2015  . Ischemic cardiomyopathy   . PAF (paroxysmal atrial fibrillation) ([place]) 09/24/2015   a. dx at time of STEMI 08/2015.  . Pre-diabetes   . PVC's (premature ventricular contractions)     Tobacco Use: History  Smoking Status  . Never Smoker  Smokeless Tobacco  . Never Used    Labs: Recent Review Flowsheet Data    Labs for ITP Cardiac and Pulmonary Rehab Latest Ref Rng & Units 10/10/2015 10/24/2015 11/26/2015 11/26/2015   Cholestrol 125 - 200 mg/dL - 93(L) - -   LDLCALC <130 mg/dL - 33 - -   HDL >=46 mg/dL - 43(L) - -   Trlycerides <150 mg/dL - 86 - -   Hemoglobin A1c <5.7 % 6.2(H) - - -   HCO3 20.0 - 28.0 mmol/L - - 23.3 22.8   TCO2 0 - 100 mmol/L - - 24 24   ACIDBASEDEF 0.0 - 2.0 mmol/L - - 1.0 1.0   O2SAT % - - 68.0 66.0      Capillary Blood Glucose: No results found for: GLUCAP   Exercise Target Goals:    Exercise Program Goal: Individual exercise prescription set with THRR, safety & activity barriers. Participant demonstrates ability to understand and report RPE using BORG scale, to self-measure pulse accurately, and to acknowledge the importance of the exercise prescription.  Exercise Prescription Goal: Starting with aerobic activity 30 plus minutes a day, 3 days per week for initial exercise prescription. Provide home exercise prescription and guidelines that participant acknowledges understanding prior to discharge.  Activity Barriers & Risk Stratification:     Activity Barriers & Cardiac Risk Stratification - 10/22/15 1520      Activity Barriers & Cardiac Risk Stratification   Activity  Barriers None   Cardiac Risk Stratification High      6 Minute Walk:     6 Minute Walk    Row Name 10/22/15 1422         6 Minute Walk   Phase Initial     Distance 1500 feet     Walk Time 6 minutes     # of Rest Breaks 0     MPH 2.84     METS 3.17     RPE 8     Perceived Dyspnea  7     VO2 Peak 15.73     Symptoms No     Resting HR 74 bpm     Resting BP 90/60     Max Ex. HR 94 bpm  Max Ex. BP 106/70     2 Minute Post BP 96/66        Initial Exercise Prescription:     Initial Exercise Prescription - 10/22/15 1400      Date of Initial Exercise RX and Referring Provider   Date 10/22/15     NuStep   Level 2   Watts 15   Minutes 15   METs 1.9     Elliptical   Level 1   Speed 1.5   Minutes 20   METs 1.9     Prescription Details   Frequency (times per week) 3   Duration Progress to 30 minutes of continuous aerobic without signs/symptoms of physical distress     Intensity   THRR REST +  30   THRR 40-80% of Max Heartrate [phone]   Ratings of Perceived Exertion 11-13   Perceived Dyspnea 0-4     Progression   Progression Continue progressive overload as per policy without signs/symptoms or physical distress.     Resistance Training   Training Prescription Yes   Weight 1   Reps 10-12      Perform Capillary Blood Glucose checks as needed.  Exercise Prescription Changes:      Exercise Prescription Changes    Row Name 11/06/15 1500 12/04/15 0800 01/01/16 1200 01/31/16 1500       Exercise Review   Progression Yes Yes Yes No      Response to Exercise   Blood Pressure (Admit) _0 84/60    Blood Pressure (Exercise) 130/70 110/64 98/60 102/66    Blood Pressure (Exit) 1_1 90/60    Heart Rate (Admit) 81 bpm 70 bpm 70 bpm 68 bpm    Heart Rate (Exercise) 93 bpm 91 bpm 100 bpm 87 bpm    Heart Rate (Exit) 90 bpm 78 bpm 77 bpm 70 bpm    Rating of Perceived Exertion (Exercise) _2 Duration Progress to 30  minutes of continuous aerobic without signs/symptoms of physical distress Progress to 30 minutes of continuous aerobic without signs/symptoms of physical distress Progress to 30 minutes of continuous aerobic without signs/symptoms of physical distress Progress to 30 minutes of continuous aerobic without signs/symptoms of physical distress    Intensity Rest + 30 Rest + 30 Rest + 30 Rest + 30      Progression   Progression Continue progressive overload as per policy without signs/symptoms or physical distress. Continue progressive overload as per policy without signs/symptoms or physical distress. Continue progressive overload as per policy without signs/symptoms or physical distress. Continue progressive overload as per policy without signs/symptoms or physical distress.      Resistance Training   Training Prescription Yes Yes Yes Yes    Weight _3 Reps 10-12 10-12 10-12 10-12      Treadmill   MPH 2.2 2.9 3 3.1    Grade 0 0 0 0    Minutes _4 METs 2.68 3.2 3.3 3.3      NuStep   Level _5 Watts 28 20 35 34    Minutes _6 METs 3.45 3.43 3.43 3.43      Elliptical   Level 1 0 0 0    Speed 1.5 0 0 0    Minutes 20 0 0 0    METs 1.9 0 0 0  Home Exercise Plan   Plans to continue exercise at [place]    Frequency Add 2 additional days to program exercise sessions. Add 2 additional days to program exercise sessions. Add 2 additional days to program exercise sessions. Add 2 additional days to program exercise sessions.       Exercise Comments:      Exercise Comments    Row Name 11/06/15 1543 12/04/15 0816 01/01/16 1255 01/31/16 1524     Exercise Comments Patient is proggressing appropriately, was switched from standing elliptical to treadmill due to tightness in chest area  Patient is progressing well. Patient was taken off of elliptical due to chest tightness  Patient is proggressing well. Patient has not proggressed much due to a  recent operation. We are awaiting her return to the program.        Discharge Exercise Prescription (Final Exercise Prescription Changes):     Exercise Prescription Changes - 01/31/16 1500      Exercise Review   Progression No     Response to Exercise   Blood Pressure (Admit) 84/60   Blood Pressure (Exercise) 102/66   Blood Pressure (Exit) 90/60   Heart Rate (Admit) 68 bpm   Heart Rate (Exercise) 87 bpm   Heart Rate (Exit) 70 bpm   Rating of Perceived Exertion (Exercise) 12   Duration Progress to 30 minutes of continuous aerobic without signs/symptoms of physical distress   Intensity Rest + 30     Progression   Progression Continue progressive overload as per policy without signs/symptoms or physical distress.     Resistance Training   Training Prescription Yes   Weight 4   Reps 10-12     Treadmill   MPH 3.1   Grade 0   Minutes 15   METs 3.3     NuStep   Level 3   Watts 34   Minutes 20   METs 3.43     Elliptical   Level 0   Speed 0   Minutes 0   METs 0     Home Exercise Plan   Plans to continue exercise at Home   Frequency Add 2 additional days to program exercise sessions.      Nutrition:  Target Goals: Understanding of nutrition guidelines, daily intake of sodium '1500mg'$ , cholesterol '200mg'$ , calories 30% from fat and 7% or less from saturated fats, daily to have 5 or more servings of fruits and vegetables.  Biometrics:     Pre Biometrics - 10/22/15 1425      Pre Biometrics   Height '5\' 9"'$  (1.753 m)   Weight 153 lb 3.5 oz (69.5 kg)   Waist Circumference 33 inches   Hip Circumference 37.5 inches   Waist to Hip Ratio 0.88 %   BMI (Calculated) 22.7   Triceps Skinfold 12 mm   % Body Fat 20.8 %   Grip Strength 70 kg   Flexibility 15 in   Single Leg Stand 60 seconds       Nutrition Therapy Plan and Nutrition Goals:   Nutrition Discharge: Rate Your Plate Scores:     Nutrition Assessments - 10/22/15 1522      MEDFICTS Scores   Pre Score  41      Nutrition Goals Re-Evaluation:   Psychosocial: Target Goals: Acknowledge presence or absence of depression, maximize coping skills, provide positive support system. Participant is able to verbalize types and ability to use techniques and skills needed for reducing stress and depression.  Initial Review &  Psychosocial Screening:     Initial Psych Review & Screening - 10/22/15 1526      Initial Review   Current issues with Current Stress Concerns;Current Sleep Concerns;Current Depression   Source of Stress Concerns Occupation     [place]? Yes     Barriers   Psychosocial barriers to participate in program There are no identifiable barriers or psychosocial needs.     Screening Interventions   Interventions Encouraged to exercise      Quality of Life Scores:     Quality of Life - 10/22/15 1426      Quality of Life Scores   Health/Function Pre 16.4 %   Socioeconomic Pre 27.07 %   Psych/Spiritual Pre 24 %   Family Pre 22.5 %   GLOBAL Pre 21.02 %      PHQ-9: Recent Review Flowsheet Data    Depression screen [facility] 2/9 10/22/2015   Decreased Interest 1   Down, Depressed, Hopeless 1   PHQ - 2 Score 2   Altered sleeping 2   Tired, decreased energy 1   Change in appetite 0   Feeling bad or failure about yourself  1   Trouble concentrating 0   Moving slowly or fidgety/restless 0   Suicidal thoughts 0   PHQ-9 Score 6   Difficult doing work/chores Somewhat difficult      Psychosocial Evaluation and Intervention:     Psychosocial Evaluation - 10/22/15 1527      Psychosocial Evaluation & Interventions   Interventions Stress management education;Relaxation education;Encouraged to exercise with the program and follow exercise prescription   Continued Psychosocial Services Needed Yes  Patient will be monitored through out the program to ensure exercising has improved her feelings of depression.       Psychosocial Re-Evaluation:      Psychosocial Re-Evaluation    [place] Name 11/09/15 1439 12/05/15 1507 01/09/16 1355 02/04/16 1434       Psychosocial Re-Evaluation   Interventions Encouraged to attend Cardiac Rehabilitation for the exercise Encouraged to attend Cardiac Rehabilitation for the exercise Encouraged to attend Cardiac Rehabilitation for the exercise Encouraged to attend Cardiac Rehabilitation for the exercise    Comments Patient's QOL score was 21.02 and her PHQ-9 score was 6. Patient has had some depression since her cardiac event but she feels some improvement coming to program. She feels she does not need counseling.   Patient's QOL score was 21.02 and her PHQ-9 score was 6. Patient has had some depression since her cardiac event but feels she does not need counseling. Her depression has improved since starting the program.  Patient says she does not feel as depressed and has a better outlook overall.  Patient's feelings of depression continue to improve with exercise.     Continued Psychosocial Services Needed  -  - No No       Vocational Rehabilitation: Provide vocational rehab assistance to qualifying candidates.   Vocational Rehab Evaluation & Intervention:     Vocational Rehab - 10/22/15 1521      Initial Vocational Rehab Evaluation & Intervention   Assessment shows need for Vocational Rehabilitation No      Education: Education Goals: Education classes will be provided on a weekly basis, covering required topics. Participant will state understanding/return demonstration of topics presented.  Learning Barriers/Preferences:     Learning Barriers/Preferences - 10/22/15 1521      Learning Barriers/Preferences   Learning Barriers None   Learning Preferences Audio;Pictoral;Skilled Demonstration;Verbal Instruction;Written Material;Video  Education Topics: Hypertension, Hypertension Reduction -Define heart disease and high blood pressure. Discus how high blood pressure affects the body and  ways to reduce high blood pressure. Flowsheet Row CARDIAC REHAB PHASE II EXERCISE from 01/16/2016 in [place]  Date  12/26/15  Educator  DC  Instruction Review Code  2- meets goals/outcomes      Exercise and Your Heart -Discuss why it is important to exercise, the FITT principles of exercise, normal and abnormal responses to exercise, and how to exercise safely. Flowsheet Row CARDIAC REHAB PHASE II EXERCISE from 01/16/2016 in [place]  Date  01/02/16  Educator  Russella Dar  Instruction Review Code  2- meets goals/outcomes      Angina -Discuss definition of angina, causes of angina, treatment of angina, and how to decrease risk of having angina.   Cardiac Medications -Review what the following cardiac medications are used for, how they affect the body, and side effects that may occur when taking the medications.  Medications include Aspirin, Beta blockers, calcium channel blockers, ACE Inhibitors, angiotensin receptor blockers, diuretics, digoxin, and antihyperlipidemics. Flowsheet Row CARDIAC REHAB PHASE II EXERCISE from 01/16/2016 in [place]  Date  01/16/16  Educator  Nils Flack  Instruction Review Code  2- meets goals/outcomes      Congestive Heart Failure -Discuss the definition of CHF, how to live with CHF, the signs and symptoms of CHF, and how keep track of weight and sodium intake.   Heart Disease and Intimacy -Discus the effect sexual activity has on the heart, how changes occur during intimacy as we age, and safety during sexual activity. Flowsheet Row CARDIAC REHAB PHASE II EXERCISE from 01/16/2016 in [place]  Date  10/31/15  Educator  DJ  Instruction Review Code  2- meets goals/outcomes      Smoking Cessation / COPD -Discuss different methods to quit smoking, the health benefits of quitting smoking, and the definition of COPD. Flowsheet Row CARDIAC REHAB PHASE  II EXERCISE from 01/16/2016 in [place]  Date  11/07/15  Educator  DJ  Instruction Review Code  2- meets goals/outcomes      Nutrition I: Fats -Discuss the types of cholesterol, what cholesterol does to the heart, and how cholesterol levels can be controlled.   Nutrition II: Labels -Discuss the different components of food labels and how to read food label [place] from 01/16/2016 in [place]  Date  11/21/15  Educator  Russella Dar  Instruction Review Code  2- meets goals/outcomes      Heart Parts and Heart Disease -Discuss the anatomy of the heart, the pathway of blood circulation through the heart, and these are affected by heart disease.   Stress I: Signs and Symptoms -Discuss the causes of stress, how stress may lead to anxiety and depression, and ways to limit stress. Flowsheet Row CARDIAC REHAB PHASE II EXERCISE from 01/16/2016 in [place]  Date  12/05/15  Educator  Russella Dar  Instruction Review Code  2- meets goals/outcomes      Stress II: Relaxation -Discuss different types of relaxation techniques to limit stress. Flowsheet Row CARDIAC REHAB PHASE II EXERCISE from 01/16/2016 in [place]  Date  12/12/15  Educator  Russella Dar  Instruction Review Code  2- meets goals/outcomes      Warning Signs of Stroke / TIA -Discuss definition of a stroke, what the signs and  symptoms are of a stroke, and how to identify when someone is having stroke. Flowsheet Row CARDIAC REHAB PHASE II EXERCISE from 01/16/2016 in [place]  Date  12/19/15  Educator  Russella Dar  Instruction Review Code  2- meets goals/outcomes      Knowledge Questionnaire Score:     Knowledge Questionnaire Score - 10/22/15 1521      Knowledge Questionnaire Score   Pre Score 23/24      Core Components/Risk Factors/Patient Goals at  Admission:     Personal Goals and Risk Factors at Admission - 10/22/15 1522      Core Components/Risk Factors/Patient Goals on Admission    Weight Management Weight Maintenance   Increase Strength and Stamina Yes   Intervention Provide advice, education, support and counseling about physical activity/exercise needs.;Develop an individualized exercise prescription for aerobic and resistive training based on initial evaluation findings, risk stratification, comorbidities and participant's personal goals.   Expected Outcomes Achievement of increased cardiorespiratory fitness and enhanced flexibility, muscular endurance and strength shown through measurements of functional capacity and personal statement of participant.   Diabetes Yes   Intervention Provide education about signs/symptoms and action to take for hypo/hyperglycemia.   Expected Outcomes Long Term: Attainment of HbA1C < 7%.   Stress Yes   Intervention Offer individual and/or small group education and counseling on adjustment to heart disease, stress management and health-related lifestyle change. Teach and support self-help strategies.   Expected Outcomes Short Term: Participant demonstrates changes in health-related behavior, relaxation and other stress management skills, ability to obtain effective social support, and compliance with psychotropic medications if prescribed.;Long Term: Emotional wellbeing is indicated by absence of clinically significant psychosocial distress or social isolation.   Personal Goal Other Yes   Personal Goal Patient wants to increase her strenght and stamina. She also want to make regular exercise a part of her lifestyle.     Intervention Patient will exercise 3 days/week in program and supplement with 2 days/week at home.    Expected Outcomes Patient will meet her above stated goals.       Core Components/Risk Factors/Patient Goals Review:      Goals and Risk Factor Review    Row Name 10/22/15 1526  11/09/15 1437 12/05/15 1501 01/09/16 1353 02/04/16 1430     Core Components/Risk Factors/Patient Goals Review   Personal Goals Review Increase Strength and Stamina;Weight Management/Obesity;Stress Weight Management/Obesity;Increase Strength and Stamina;Other  Patient wants to increase her strenght and stamina. She also want to make regular exercise a part of her lifestyle.  Increase Strength and Stamina;Other  Patient wants exercise to be a regular part of her lifestyle.  Weight Management/Obesity;Increase Strength and Stamina;Other  Make exercise a part of her lifestyle.  Increase Strength and Stamina  Make exercise a part of her lifestyle.   Review  - After 5 sessions, patient has gained 0.5 lbs. She is doing well in the program. She says she feels better and has seen a slight improvement in her strength.  After 11 sessions, patient has lost 5 lbs. She is doing well in the program. Her strength and stamina are improved. She was hospitalized for a few days with vomiting and general malaise. She says her cardiologist discussed a internal defrillator placement. She has also missed some sessions with a URI. Her b/p continues to be low. Her PCP is adjusting her medications.  Patient has attended 26 sessions. She has lost 5.5 lbs and her strength and stamina have increased. She says she feels  better and plans to continue exercising after graduation.  Patient's last day of attendance was 01/16/16 at 27 sessions. She had an internal defibrillator implanted 01/23/16. She plans to return to complete the program when she is released by her cardiologist.    Expected Outcomes  - Patient will continue to attend sessions maintaining her weight with improved strength and stamina.  Patient will continue to attend sessions and complete the program meeting the above stated goals.  Patient will complete the program meeting her personal goals.  Patient will return and complete the program meeting her personal goals.        Core Components/Risk Factors/Patient Goals at Discharge (Final Review):      Goals and Risk Factor Review - 02/04/16 1430      Core Components/Risk Factors/Patient Goals Review   Personal Goals Review Increase Strength and Stamina  Make exercise a part of her lifestyle.   Review Patient's last day of attendance was 01/16/16 at 27 sessions. She had an internal defibrillator implanted 01/23/16. She plans to return to complete the program when she is released by her cardiologist.    Expected Outcomes Patient will return and complete the program meeting her personal goals.       ITP Comments:     ITP Comments    Row Name 11/01/15 0900           ITP Comments Patient met with Registered Dietitian to discuss nutrition topics including: Heart healthty eating, heart healthy cooking and making smart choices when shopping; Portion control; weight management; and hydration. He also attended a class with hospital chaplian called [facility] to discuss how his diagnosis has impacted his life.          Comments: ITP 30 Day REVIEW Patient had internal defibrillator implanted 01/23/16. She plans to return to complete the program when released from her Cardiologist.

## 2016-02-04 NOTE — Progress Notes (Signed)
Wound check appointment. Steri-strips removed. Wound without redness or edema. Incision edges approximated, wound well healed. Normal device function. Thresholds, sensing, and impedances consistent with implant measurements. Device programmed at 3.5V for extra safety margin until 3 month visit. Histogram distribution appropriate for patient and level of activity. No ventricular arrhythmias noted. Patient educated about wound care, arm mobility, lifting restrictions, shock plan, and Merlin monitor. ROV with WC on 04/25/16.

## 2016-02-11 ENCOUNTER — Ambulatory Visit (HOSPITAL_COMMUNITY)
Admission: RE | Admit: 2016-02-11 | Discharge: 2016-02-11 | Disposition: A | Discharge status: Home / Self Care | Payer: BLUE CROSS/BLUE SHIELD | Source: Ambulatory Visit | Attending: Cardiology | Admitting: Cardiology

## 2016-02-11 VITALS — BP 106/70 | HR 80 | Wt 143.7 lb

## 2016-02-11 DIAGNOSIS — I5022 Chronic systolic (congestive) heart failure: Secondary | ICD-10-CM | POA: Insufficient documentation

## 2016-02-11 DIAGNOSIS — I255 Ischemic cardiomyopathy: Secondary | ICD-10-CM | POA: Diagnosis not present

## 2016-02-11 DIAGNOSIS — E785 Hyperlipidemia, unspecified: Secondary | ICD-10-CM | POA: Insufficient documentation

## 2016-02-11 DIAGNOSIS — I480 Paroxysmal atrial fibrillation: Secondary | ICD-10-CM | POA: Diagnosis not present

## 2016-02-11 DIAGNOSIS — Z7901 Long term (current) use of anticoagulants: Secondary | ICD-10-CM | POA: Insufficient documentation

## 2016-02-11 DIAGNOSIS — Z95810 Presence of automatic (implantable) cardiac defibrillator: Secondary | ICD-10-CM | POA: Insufficient documentation

## 2016-02-11 DIAGNOSIS — I2510 Atherosclerotic heart disease of native coronary artery without angina pectoris: Secondary | ICD-10-CM | POA: Diagnosis present

## 2016-02-11 DIAGNOSIS — I252 Old myocardial infarction: Secondary | ICD-10-CM | POA: Diagnosis not present

## 2016-02-11 DIAGNOSIS — I340 Nonrheumatic mitral (valve) insufficiency: Secondary | ICD-10-CM | POA: Diagnosis not present

## 2016-02-11 LAB — BASIC METABOLIC PANEL
Anion gap: 9 (ref 5–15)
BUN: 16 mg/dL (ref 6–20)
CO2: 27 mmol/L (ref 22–32)
Calcium: 9.4 mg/dL (ref 8.9–10.3)
Chloride: 105 mmol/L (ref 101–111)
Creatinine, Ser: 1.12 mg/dL — ABNORMAL HIGH (ref 0.44–1.00)
GFR calc Af Amer: >60 mL/min (ref >60)
GFR calc non Af Amer: 56 mL/min — ABNORMAL LOW (ref >60)
Glucose, Bld: 99 mg/dL (ref 65–99)
Potassium: 4.2 mmol/L (ref 3.5–5.1)
Sodium: 141 mmol/L (ref 135–145)

## 2016-02-11 LAB — BRAIN NATRIURETIC PEPTIDE: B Natriuretic Peptide: 439.3 pg/mL — ABNORMAL HIGH (ref 0.0–100.0)

## 2016-02-11 MED ORDER — FUROSEMIDE 20 MG PO TABS: 20.0000 mg | ORAL_TABLET | Freq: Every day | ORAL | 6 refills | Status: DC

## 2016-02-11 MED ORDER — SPIRONOLACTONE 25 MG PO TABS: 12.5000 mg | ORAL_TABLET | Freq: Every day | ORAL | 3 refills | Status: DC

## 2016-02-11 NOTE — Patient Instructions (Signed)
DECREASE Lasix to 20 mg once daily. Can cut your 40 mg tablets in half. New Rx for 20 mg tablets has been sent to your pharmacy.  START Spironolactone 12.5 mg (1/2 tablet) once daily.  Routine lab work today. Will notify you of abnormal results, otherwise no news is good news!  Repeat labs in 1-2 weeks at your preferred location. Take Rx paper with you to this appointment.  Follow up 1 month with Dr. Shirlee Latch.  Do the following things EVERYDAY: 1) Weigh yourself in the morning before breakfast. Write it down and keep it in a log. 2) Take your medicines as prescribed 3) Eat low salt foods-Limit salt (sodium) to 2000 mg per day.  4) Stay as active as you can everyday 5) Limit all fluids for the day to less than 2 liters

## 2016-02-11 NOTE — Progress Notes (Signed)
PCP: Dr. Margo AyeHall Cardiology: Dr. Clifton JamesMcAlhany HF Cardiology: Dr. Shirlee LatchMcLean  50 yo with history of CAD s/p anterior STEMI with DES to LAD, ischemic cardiomyopathy, mitral regurgitation, and paroxysmal atrial fibrillation presents for CHF clinic evaluation.  She was admitted in 7/17 with anterior STEMI.  She had a late presentation to the cath lab.  Most recent echo in 10/17 showed EF about 25% with LAD-territory wall motion abnormalities.  She had peri-MI atrial fibrillation and was started on Xarelto.  She was put on amiodarone.   After discharge, she continued to feel poorly.  She developed nausea, anorexia, and significant fatigue.  She was admitted for evaluation in 10/17 given concern for low output heart failure.  However, stopping amiodarone essentially resolved her symptoms.  She had RHC showing preserved cardiac output but the PCWP was high due to prominent v-waves, presumably from significant mitral regurgitation.  Of note, she was in atrial fibrillation for a time during this admission.   She had [employer] Jude ICD placed.  She is back at work for the [employer].  Generally feels like she is doing well.  She is short of breath if she walks up stairs fast, otherwise no dyspnea. No problems walking on flat ground.  No chest pain.  No orthopnea/PND.  No palpitations.    Labs (10/17): K 3.9, creatinine 1.24, hgb 10.3 Labs (11/17): K 4, creatinine 1.27  PMH:  1. CAD: Anterior STEMI in 7/17 with late presentation to the cath lab. She had DES to proximal LAD.  No significant disease in other vessels.  2. Chronic systolic CHF: Ischemic cardiomyopathy.  - Echo 10/17 with EF 25%, akinesis of the mid-apical anteroseptal and anterior walls, apical dyskinesis, moderate to severe MR with incomplete coaptation.  - RHC (10/17): mean RA 4, PA 59/17 mean 38, mean PCWP 27 with prominent V waves suggesting significant MR, CI 3.06, PVR 2 WU.  - ACEI cough.  - Echo (11/17): EF 30-35%, normal RV size and systolic  function, severe MR with restriction of posterior leaflet.   3. Atrial fibrillation: Paroxysmal.  Noted 7/17 admission peri-MI, also noted again 10/17 admission. She did not tolerate amiodarone due to nausea/anorexia.  4. Mitral regurgitation: Moderate to severe on 10/17 echo, incomplete coaptation.  ?Etiology, her MI (anterior) does not typically lead to ischemic MR.   - Echo in 11/17 with severe MR, posterior leaflet restricted.  5. Hyperlipidemia  SH: Married, [place] in [place], works for the [employer], no smoking or ETOH.   Family History  Problem Relation Age of Onset  . Heart attack Father   . Arrhythmia Mother    ROS: All systems reviewed and negative except as per HPI.   Current Outpatient Prescriptions  Medication Sig Dispense Refill  . atorvastatin (LIPITOR) 80 MG tablet Take 1 tablet (80 mg total) by mouth daily at 6 PM. 30 tablet 11  . Calcium Carb-Cholecalciferol (CALCIUM + D3 PO) Take 1 tablet by mouth every morning.    . carvedilol (COREG) 3.125 MG tablet Take 1 tablet (3.125 mg total) by mouth 2 (two) times daily with a meal. 60 tablet 6  . furosemide (LASIX) 20 MG tablet Take 2 tablets (40 mg total) by mouth daily. 30 tablet 4  . pantoprazole (PROTONIX) 40 MG tablet Take 1 tablet (40 mg total) by mouth daily. 30 tablet 11  . potassium chloride SA (K-DUR,KLOR-CON) 20 MEQ tablet Take 3 tablets (60 mEq total) by mouth daily. 90 tablet 6  . Rivaroxaban (XARELTO) 15 MG TABS tablet  Take 1 tablet (15 mg total) by mouth daily with supper. 30 tablet 6  . SYNTHROID 125 MCG tablet Take 125 mcg by mouth every morning.  5  . ticagrelor (BRILINTA) 90 MG TABS tablet Take 1 tablet (90 mg total) by mouth 2 (two) times daily. 180 tablet 3  . traZODone (DESYREL) 50 MG tablet Take 0.5 tablets (25 mg total) by mouth at bedtime as needed for sleep. 30 tablet 0  . triamcinolone (NASACORT ALLERGY 24HR) 55 MCG/ACT AERO nasal inhaler Place 2 sprays into the nose daily.    Marland Kitchen losartan (COZAAR) 25 MG  tablet Take 0.5 tablets (12.5 mg total) by mouth at bedtime. 15 tablet 3  . nitroGLYCERIN (NITROSTAT) 0.4 MG SL tablet Place 1 tablet (0.4 mg total) under the tongue every 5 (five) minutes as needed for chest pain. (Patient not taking: Reported on 12/11/2015) 25 tablet 3    BP 106/70 (BP Location: Left Arm, Patient Position: Sitting, Cuff Size: Normal)   Pulse 80   Wt 143 lb 11.2 oz (65.2 kg)   SpO2 96%   BMI 21.22 kg/m  General: NAD Neck: No JVD, no thyromegaly or thyroid nodule.  Lungs: Clear to auscultation bilaterally with normal respiratory effort. CV: Nondisplaced PMI.  Heart regular S1/S2, no S3/S4, 1/6 HSM at apex.  No peripheral edema.  No carotid bruit.  Normal pedal pulses.  Abdomen: Soft, nontender, no hepatosplenomegaly, no distention.  Skin: Intact without lesions or rashes.  Neurologic: Alert and oriented x 3.  Psych: Normal affect. Extremities: No clubbing or cyanosis.  HEENT: Normal.   Assessment/Plan: 1. CAD: No ischemic-type chest pain.  S/p anterior MI in 7/17 with DES to RCA.   - Continue Xarelto 15 mg daily and ticagrelor 90 mg bid => she is about 5 months out from MI, will discuss with Dr. Clifton James transition from ticagrelor to Plavix to finish out a year to minimize bleeding risk since she is also on Xarelto.  No aspirin given Xarelto use.  - Continue statin.  2. Chronic systolic CHF: Ischemic cardiomyopathy.  EF 25% on echo in 10/17, 30-35% on echo in 11/17. She has extensive LAD-territory scar. 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  She is not volume overloaded on exam. NYHA class II symptoms.  BP runs low.  - Continue Coreg 3.125 mg bid and losartan 12.5 daily.    - Add spironolactone 12.5 mg daily with BMET/BNP today and again in 10 days.  - Decrease Lasix to 20 mg daily.   3. Mitral regurgitation: Moderate to severe on 10/17 echo, severe on 11/17 echo.  The posterior leaflet appeared restricted.  I do not think that this is ischemic MR.  Patient tells me that she has been  told that she has had a murmur for years but never had an echo prior, I am concerned that she has had pre-existing primary mitral regurgitation that may end up needing to be repaired. Her murmur today, however, is less prominent.  Will continue to monitor, consider TEE eventually.   4. Atrial fibrillation: Unable to tolerate amiodarone due to GI side effects.  She is in NSR today on Xarelto 15 mg daily. If she has symptomatic recurrence, can consider Tikosyn.   Marca Ancona 02/11/2016

## 2016-02-15 NOTE — Progress Notes (Signed)
She can stop ticagrelor at this point and go on Plavix instead.  Probably can stop it and start Plavix 75 mg daily on the same day but check with Alcario Drought about plan for the transtion (would use Plavix with Xarelto to decrease bleeding risk compared to ticagrelor with Xarelto).

## 2016-02-20 ENCOUNTER — Telehealth (HOSPITAL_COMMUNITY): Payer: Self-pay | Admitting: *Deleted

## 2016-02-20 NOTE — Telephone Encounter (Signed)
Discussed w/Lisa, pharm D she recommends pt take her PM dose of Brilinta and the next day instead of Brilinta take Plavix 75 mg 2 tabs then the next day pt start taking Plavix 75 mg 1 tab daily.  Have attempted to call pt and left mess for her to call back.

## 2016-02-20 NOTE — Telephone Encounter (Signed)
-----   Message from [name], MD sent at 02/15/2016  5:21 PM EST -----   ----- Message ----- From: Kathleene Hazel, MD Sent: 02/12/2016   7:18 AM To: [name], MD  I think that would be fine Dalton.   ----- Message ----- From: [name], MD Sent: 02/11/2016  11:13 PM To: Kathleene Hazel, MD  Thayer Ohm: Mrs Alward has been on Brilinta + Xarelto for about 5 months, what do you think about switching over to Plavix at this point to decrease bleeding risk?

## 2016-02-21 MED ORDER — CLOPIDOGREL BISULFATE 75 MG PO TABS: 75.0000 mg | ORAL_TABLET | Freq: Every day | ORAL | 3 refills | Status: DC

## 2016-02-21 NOTE — Telephone Encounter (Signed)
Patient returned call and instructions  Given to patient. rx sen to pharmacy last dose of brilinta 12/28 AM will hold PM dose. Start plavix 75 mg (2 tabs) 12/29 and 75 mg daily one tab daily thereafter. Patient aware she should continue xarelto as well.

## 2016-02-28 NOTE — Progress Notes (Signed)
Discharge Summary  Patient Details  Name: Catherine Mays MRN: [id number] Date of Birth: 1965/07/31 Referring Provider:     Number of Visits: 27  Reason for Discharge:  Early Exit:  She had an internal defibrillator implanted and never returned after procedure. Discharge letter mailed.   Smoking History:  History  Smoking Status  . Never Smoker  Smokeless Tobacco  . Never Used    Diagnosis:  ST elevation myocardial infarction (STEMI), unspecified artery (HCC)  ADL UCSD:   Initial Exercise Prescription:     Initial Exercise Prescription - 10/22/15 1400      Date of Initial Exercise RX and Referring Provider   Date 10/22/15     NuStep   Level 2   Watts 15   Minutes 15   METs 1.9     Elliptical   Level 1   Speed 1.5   Minutes 20   METs 1.9     Prescription Details   Frequency (times per week) 3   Duration Progress to 30 minutes of continuous aerobic without signs/symptoms of physical distress     Intensity   THRR REST +  30   THRR 40-80% of Max Heartrate [id number]   Ratings of Perceived Exertion 11-13   Perceived Dyspnea 0-4     Progression   Progression Continue progressive overload as per policy without signs/symptoms or physical distress.     Resistance Training   Training Prescription Yes   Weight 1   Reps 10-12      Discharge Exercise Prescription (Final Exercise Prescription Changes):     Exercise Prescription Changes - 01/31/16 1500      Exercise Review   Progression No     Response to Exercise   Blood Pressure (Admit) 84/60   Blood Pressure (Exercise) 102/66   Blood Pressure (Exit) 90/60   Heart Rate (Admit) 68 bpm   Heart Rate (Exercise) 87 bpm   Heart Rate (Exit) 70 bpm   Rating of Perceived Exertion (Exercise) 12   Duration Progress to 30 minutes of continuous aerobic without signs/symptoms of physical distress   Intensity Rest + 30     Progression   Progression Continue progressive overload as per policy without  signs/symptoms or physical distress.     Resistance Training   Training Prescription Yes   Weight 4   Reps 10-12     Treadmill   MPH 3.1   Grade 0   Minutes 15   METs 3.3     NuStep   Level 3   Watts 34   Minutes 20   METs 3.43     Elliptical   Level 0   Speed 0   Minutes 0   METs 0     Home Exercise Plan   Plans to continue exercise at Home   Frequency Add 2 additional days to program exercise sessions.      Functional Capacity:     6 Minute Walk    Row Name 10/22/15 1422         6 Minute Walk   Phase Initial     Distance 1500 feet     Walk Time 6 minutes     # of Rest Breaks 0     MPH 2.84     METS 3.17     RPE 8     Perceived Dyspnea  7     VO2 Peak 15.73     Symptoms No     Resting HR 74 bpm  Resting BP 90/60     Max Ex. HR 94 bpm     Max Ex. BP 106/70     2 Minute Post BP 96/66        Psychological, QOL, Others - Outcomes: PHQ 2/9: Depression screen PHQ 2/9 10/22/2015  Decreased Interest 1  Down, Depressed, Hopeless 1  PHQ - 2 Score 2  Altered sleeping 2  Tired, decreased energy 1  Change in appetite 0  Feeling bad or failure about yourself  1  Trouble concentrating 0  Moving slowly or fidgety/restless 0  Suicidal thoughts 0  PHQ-9 Score 6  Difficult doing work/chores Somewhat difficult    Quality of Life:     Quality of Life - 10/22/15 1426      Quality of Life Scores   Health/Function Pre 16.4 %   Socioeconomic Pre 27.07 %   Psych/Spiritual Pre 24 %   Family Pre 22.5 %   GLOBAL Pre 21.02 %      Personal Goals: Goals established at orientation with interventions provided to work toward goal.     Personal Goals and Risk Factors at Admission - 10/22/15 1522      Core Components/Risk Factors/Patient Goals on Admission    Weight Management Weight Maintenance   Increase Strength and Stamina Yes   Intervention Provide advice, education, support and counseling about physical activity/exercise needs.;Develop an  individualized exercise prescription for aerobic and resistive training based on initial evaluation findings, risk stratification, comorbidities and participant's personal goals.   Expected Outcomes Achievement of increased cardiorespiratory fitness and enhanced flexibility, muscular endurance and strength shown through measurements of functional capacity and personal statement of participant.   Diabetes Yes   Intervention Provide education about signs/symptoms and action to take for hypo/hyperglycemia.   Expected Outcomes Long Term: Attainment of HbA1C < 7%.   Stress Yes   Intervention Offer individual and/or small group education and counseling on adjustment to heart disease, stress management and health-related lifestyle change. Teach and support self-help strategies.   Expected Outcomes Short Term: Participant demonstrates changes in health-related behavior, relaxation and other stress management skills, ability to obtain effective social support, and compliance with psychotropic medications if prescribed.;Long Term: Emotional wellbeing is indicated by absence of clinically significant psychosocial distress or social isolation.   Personal Goal Other Yes   Personal Goal Patient wants to increase her strenght and stamina. She also want to make regular exercise a part of her lifestyle.     Intervention Patient will exercise 3 days/week in program and supplement with 2 days/week at home.    Expected Outcomes Patient will meet her above stated goals.        Personal Goals Discharge:     Goals and Risk Factor Review    Row Name 10/22/15 1526 11/09/15 1437 12/05/15 1501 01/09/16 1353 02/04/16 1430     Core Components/Risk Factors/Patient Goals Review   Personal Goals Review Increase Strength and Stamina;Weight Management/Obesity;Stress Weight Management/Obesity;Increase Strength and Stamina;Other  Patient wants to increase her strenght and stamina. She also want to make regular exercise a part of  her lifestyle.  Increase Strength and Stamina;Other  Patient wants exercise to be a regular part of her lifestyle.  Weight Management/Obesity;Increase Strength and Stamina;Other  Make exercise a part of her lifestyle.  Increase Strength and Stamina  Make exercise a part of her lifestyle.   Review  - After 5 sessions, patient has gained 0.5 lbs. She is doing well in the program. She says she feels better and  has seen a slight improvement in her strength.  After 11 sessions, patient has lost 5 lbs. She is doing well in the program. Her strength and stamina are improved. She was hospitalized for a few days with vomiting and general malaise. She says her cardiologist discussed a internal defrillator placement. She has also missed some sessions with a URI. Her b/p continues to be low. Her PCP is adjusting her medications.  Patient has attended 26 sessions. She has lost 5.5 lbs and her strength and stamina have increased. She says she feels better and plans to continue exercising after graduation.  Patient's last day of attendance was 01/16/16 at 27 sessions. She had an internal defibrillator implanted 01/23/16. She plans to return to complete the program when she is released by her cardiologist.    Expected Outcomes  - Patient will continue to attend sessions maintaining her weight with improved strength and stamina.  Patient will continue to attend sessions and complete the program meeting the above stated goals.  Patient will complete the program meeting her personal goals.  Patient will return and complete the program meeting her personal goals.       Nutrition & Weight - Outcomes:     Pre Biometrics - 10/22/15 1425      Pre Biometrics   Height 5\' 9"  (1.753 m)   Weight 153 lb 3.5 oz (69.5 kg)   Waist Circumference 33 inches   Hip Circumference 37.5 inches   Waist to Hip Ratio 0.88 %   BMI (Calculated) 22.7   Triceps Skinfold 12 mm   % Body Fat 20.8 %   Grip Strength 70 kg   Flexibility 15 in    Single Leg Stand 60 seconds       Nutrition:   Nutrition Discharge:     Nutrition Assessments - 10/22/15 1522      MEDFICTS Scores   Pre Score 41      Education Questionnaire Score:     Knowledge Questionnaire Score - 10/22/15 1521      Knowledge Questionnaire Score   Pre Score 23/24

## 2016-02-28 NOTE — Progress Notes (Signed)
Cardiac Individual Treatment Plan  Patient Details  Name: Catherine Mays MRN: [id number] Date of Birth: [birth year] Referring Provider:    Initial Encounter Date:  [place] PHASE II ORIENTATION from 10/22/2015 in [place]  Date  10/22/15      Visit Diagnosis: ST elevation myocardial infarction (STEMI), unspecified artery (HCC)  Patient's Home Medications on Admission:  Current Outpatient Prescriptions:  .  atorvastatin (LIPITOR) 80 MG tablet, Take 1 tablet (80 mg total) by mouth daily at 6 PM., Disp: 30 tablet, Rfl: 11 .  Calcium Carb-Cholecalciferol (CALCIUM + D3 PO), Take 1 tablet by mouth every morning., Disp: , Rfl:  .  carvedilol (COREG) 3.125 MG tablet, Take 1 tablet (3.125 mg total) by mouth 2 (two) times daily with a meal., Disp: 60 tablet, Rfl: 6 .  clopidogrel (PLAVIX) 75 MG tablet, Take 1 tablet (75 mg total) by mouth daily., Disp: 90 tablet, Rfl: 3 .  fexofenadine (ALLEGRA) 180 MG tablet, Take 180 mg by mouth daily., Disp: , Rfl:  .  furosemide (LASIX) 20 MG tablet, Take 1 tablet (20 mg total) by mouth daily., Disp: 30 tablet, Rfl: 6 .  losartan (COZAAR) 25 MG tablet, Take 0.5 tablets (12.5 mg total) by mouth at bedtime., Disp: 15 tablet, Rfl: 3 .  nitroGLYCERIN (NITROSTAT) 0.4 MG SL tablet, Place 1 tablet (0.4 mg total) under the tongue every 5 (five) minutes as needed for chest pain., Disp: 25 tablet, Rfl: 3 .  pantoprazole (PROTONIX) 40 MG tablet, Take 1 tablet (40 mg total) by mouth daily., Disp: 30 tablet, Rfl: 11 .  potassium chloride SA (K-DUR,KLOR-CON) 20 MEQ tablet, Take 3 tablets (60 mEq total) by mouth daily., Disp: 90 tablet, Rfl: 6 .  Rivaroxaban (XARELTO) 15 MG TABS tablet, Take 1 tablet (15 mg total) by mouth daily with supper. Resume evening of 01/25/16, Disp: 30 tablet, Rfl: 6 .  spironolactone (ALDACTONE) 25 MG tablet, Take 0.5 tablets (12.5 mg total) by mouth daily., Disp: 45 tablet, Rfl: 3 .  SYNTHROID 125 MCG  tablet, Take 125 mcg by mouth every morning., Disp: , Rfl: 5 .  traZODone (DESYREL) 50 MG tablet, Take 0.5 tablets (25 mg total) by mouth at bedtime as needed for sleep., Disp: 30 tablet, Rfl: 0 .  triamcinolone (NASACORT ALLERGY 24HR) 55 MCG/ACT AERO nasal inhaler, Place 2 sprays into the nose daily., Disp: , Rfl:  No current facility-administered medications for this encounter.   Facility-Administered Medications Ordered in Other Encounters:  .  bivalirudin (ANGIOMAX) 250 mg in sodium chloride 0.9 % 50 mL (5 mg/mL) infusion, , , Continuous PRN, Burnell Blanks, MD, Stopped at 09/20/15 [phone] .  bivalirudin (ANGIOMAX) BOLUS via infusion, , , PRN, Burnell Blanks, MD, 54.45 mg at 09/20/15 1191 .  fentaNYL (SUBLIMAZE) injection, , , PRN, Burnell Blanks, MD, 25 mcg at 09/20/15 0651 .  heparin 1,500 mL, , , PRN, Burnell Blanks, MD .  heparin infusion 2 units/mL in 0.9 % sodium chloride, , , Continuous PRN, Burnell Blanks, MD, 1,500 mL at 09/20/15 0733 .  iopamidol (ISOVUE-370) 76 % injection, , , PRN, Burnell Blanks, MD, 165 mL at 09/20/15 0733 .  lidocaine (PF) (XYLOCAINE) 1 % injection, , , PRN, Burnell Blanks, MD, 2 mL at 09/20/15 4782 .  midazolam (VERSED) injection, , , PRN, Burnell Blanks, MD, 1 mg at 09/20/15 0651 .  nitroGLYCERIN 1 mg/10 ml (100 mcg/ml) - IR/CATH LAB, , , PRN, Burnell Blanks,  MD, 200 mcg at 09/20/15 1194 .  Radial Cocktail/Verapamil only, , , PRN, Burnell Blanks, MD, 10 mL at 09/20/15 0629 .  ticagrelor (BRILINTA) tablet, , , PRN, Burnell Blanks, MD, 180 mg at 09/20/15 [phone] .  tirofiban (AGGRASTAT) bolus via infusion, , , PRN, Burnell Blanks, MD, 1,815 mcg at 09/20/15 [phone] .  tirofiban (AGGRASTAT) infusion 50 mcg/mL 100 mL, , , Continuous PRN, Burnell Blanks, MD, Last Rate: 13.1 mL/hr at 09/20/15 0651, 0.15 mcg/kg/min at 09/20/15 8185  Past Medical History: Past Medical  History:  Diagnosis Date  . CAD in native artery   . Chronic systolic CHF (congestive heart failure) ([place])   . Hypotension    a. preventing med titration for HF.  [facility] Hypothyroidism 09/24/2015  . Ischemic cardiomyopathy   . PAF (paroxysmal atrial fibrillation) ([place]) 09/24/2015   a. dx at time of STEMI 08/2015.  . Pre-diabetes   . PVC's (premature ventricular contractions)     Tobacco Use: History  Smoking Status  . Never Smoker  Smokeless Tobacco  . Never Used    Labs: Recent Review Flowsheet Data    Labs for ITP Cardiac and Pulmonary Rehab Latest Ref Rng & Units 10/10/2015 10/24/2015 11/26/2015 11/26/2015   Cholestrol 125 - 200 mg/dL - 93(L) - -   LDLCALC <130 mg/dL - 33 - -   HDL >=46 mg/dL - 43(L) - -   Trlycerides <150 mg/dL - 86 - -   Hemoglobin A1c <5.7 % 6.2(H) - - -   HCO3 20.0 - 28.0 mmol/L - - 23.3 22.8   TCO2 0 - 100 mmol/L - - 24 24   ACIDBASEDEF 0.0 - 2.0 mmol/L - - 1.0 1.0   O2SAT % - - 68.0 66.0      Capillary Blood Glucose: No results found for: GLUCAP   Exercise Target Goals:    Exercise Program Goal: Individual exercise prescription set with THRR, safety & activity barriers. Participant demonstrates ability to understand and report RPE using BORG scale, to self-measure pulse accurately, and to acknowledge the importance of the exercise prescription.  Exercise Prescription Goal: Starting with aerobic activity 30 plus minutes a day, 3 days per week for initial exercise prescription. Provide home exercise prescription and guidelines that participant acknowledges understanding prior to discharge.  Activity Barriers & Risk Stratification:     Activity Barriers & Cardiac Risk Stratification - 10/22/15 1520      Activity Barriers & Cardiac Risk Stratification   Activity Barriers None   Cardiac Risk Stratification High      6 Minute Walk:     6 Minute Walk    Row Name 10/22/15 1422         6 Minute Walk   Phase Initial     Distance 1500 feet      Walk Time 6 minutes     # of Rest Breaks 0     MPH 2.84     METS 3.17     RPE 8     Perceived Dyspnea  7     VO2 Peak 15.73     Symptoms No     Resting HR 74 bpm     Resting BP 90/60     Max Ex. HR 94 bpm     Max Ex. BP 106/70     2 Minute Post BP 96/66        Initial Exercise Prescription:     Initial Exercise Prescription - 10/22/15 1400  Date of Initial Exercise RX and Referring Provider   Date 10/22/15     NuStep   Level 2   Watts 15   Minutes 15   METs 1.9     Elliptical   Level 1   Speed 1.5   Minutes 20   METs 1.9     Prescription Details   Frequency (times per week) 3   Duration Progress to 30 minutes of continuous aerobic without signs/symptoms of physical distress     Intensity   THRR REST +  30   THRR 40-80% of Max Heartrate [phone]   Ratings of Perceived Exertion 11-13   Perceived Dyspnea 0-4     Progression   Progression Continue progressive overload as per policy without signs/symptoms or physical distress.     Resistance Training   Training Prescription Yes   Weight 1   Reps 10-12      Perform Capillary Blood Glucose checks as needed.  Exercise Prescription Changes:      Exercise Prescription Changes    Row Name 11/06/15 1500 12/04/15 0800 01/01/16 1200 01/31/16 1500       Exercise Review   Progression Yes Yes Yes No      Response to Exercise   Blood Pressure (Admit) '96/40 88/60 90/56 '$ 84/60    Blood Pressure (Exercise) 130/70 110/64 98/60 102/66    Blood Pressure (Exit) 1'08/62 86/68 86/50 '$ 90/60    Heart Rate (Admit) 81 bpm 70 bpm 70 bpm 68 bpm    Heart Rate (Exercise) 93 bpm 91 bpm 100 bpm 87 bpm    Heart Rate (Exit) 90 bpm 78 bpm 77 bpm 70 bpm    Rating of Perceived Exertion (Exercise) '8 8 10 12    '$ Duration Progress to 30 minutes of continuous aerobic without signs/symptoms of physical distress Progress to 30 minutes of continuous aerobic without signs/symptoms of physical distress Progress to 30 minutes of  continuous aerobic without signs/symptoms of physical distress Progress to 30 minutes of continuous aerobic without signs/symptoms of physical distress    Intensity Rest + 30 Rest + 30 Rest + 30 Rest + 30      Progression   Progression Continue progressive overload as per policy without signs/symptoms or physical distress. Continue progressive overload as per policy without signs/symptoms or physical distress. Continue progressive overload as per policy without signs/symptoms or physical distress. Continue progressive overload as per policy without signs/symptoms or physical distress.      Resistance Training   Training Prescription Yes Yes Yes Yes    Weight '2 2 4 4    '$ Reps 10-12 10-12 10-12 10-12      Treadmill   MPH 2.2 2.9 3 3.1    Grade 0 0 0 0    Minutes '15 15 15 15    '$ METs 2.68 3.2 3.3 3.3      NuStep   Level '2 2 3 3    '$ Watts 28 20 35 34    Minutes '15 20 20 20    '$ METs 3.45 3.43 3.43 3.43      Elliptical   Level 1 0 0 0    Speed 1.5 0 0 0    Minutes 20 0 0 0    METs 1.9 0 0 0      Home Exercise Plan   Plans to continue exercise at [place]    Frequency Add 2 additional days to program exercise sessions. Add 2 additional days to program exercise sessions. Add 2  additional days to program exercise sessions. Add 2 additional days to program exercise sessions.       Exercise Comments:      Exercise Comments    Row Name 11/06/15 1543 12/04/15 0816 01/01/16 1255 01/31/16 1524     Exercise Comments Patient is proggressing appropriately, was switched from standing elliptical to treadmill due to tightness in chest area  Patient is progressing well. Patient was taken off of elliptical due to chest tightness  Patient is proggressing well. Patient has not proggressed much due to a recent operation. We are awaiting her return to the program.        Discharge Exercise Prescription (Final Exercise Prescription Changes):     Exercise Prescription Changes - 01/31/16  1500      Exercise Review   Progression No     Response to Exercise   Blood Pressure (Admit) 84/60   Blood Pressure (Exercise) 102/66   Blood Pressure (Exit) 90/60   Heart Rate (Admit) 68 bpm   Heart Rate (Exercise) 87 bpm   Heart Rate (Exit) 70 bpm   Rating of Perceived Exertion (Exercise) 12   Duration Progress to 30 minutes of continuous aerobic without signs/symptoms of physical distress   Intensity Rest + 30     Progression   Progression Continue progressive overload as per policy without signs/symptoms or physical distress.     Resistance Training   Training Prescription Yes   Weight 4   Reps 10-12     Treadmill   MPH 3.1   Grade 0   Minutes 15   METs 3.3     NuStep   Level 3   Watts 34   Minutes 20   METs 3.43     Elliptical   Level 0   Speed 0   Minutes 0   METs 0     Home Exercise Plan   Plans to continue exercise at Home   Frequency Add 2 additional days to program exercise sessions.      Nutrition:  Target Goals: Understanding of nutrition guidelines, daily intake of sodium '1500mg'$ , cholesterol '200mg'$ , calories 30% from fat and 7% or less from saturated fats, daily to have 5 or more servings of fruits and vegetables.  Biometrics:     Pre Biometrics - 10/22/15 1425      Pre Biometrics   Height '5\' 9"'$  (1.753 m)   Weight 153 lb 3.5 oz (69.5 kg)   Waist Circumference 33 inches   Hip Circumference 37.5 inches   Waist to Hip Ratio 0.88 %   BMI (Calculated) 22.7   Triceps Skinfold 12 mm   % Body Fat 20.8 %   Grip Strength 70 kg   Flexibility 15 in   Single Leg Stand 60 seconds       Nutrition Therapy Plan and Nutrition Goals:   Nutrition Discharge: Rate Your Plate Scores:     Nutrition Assessments - 10/22/15 1522      MEDFICTS Scores   Pre Score 41      Nutrition Goals Re-Evaluation:   Psychosocial: Target Goals: Acknowledge presence or absence of depression, maximize coping skills, provide positive support system.  Participant is able to verbalize types and ability to use techniques and skills needed for reducing stress and depression.  Initial Review & Psychosocial Screening:     Initial Psych Review & Screening - 10/22/15 1526      Initial Review   Current issues with Current Stress Concerns;Current Sleep Concerns;Current Depression   Source of  Stress Concerns Occupation     [place]? Yes     Barriers   Psychosocial barriers to participate in program There are no identifiable barriers or psychosocial needs.     Screening Interventions   Interventions Encouraged to exercise      Quality of Life Scores:     Quality of Life - 10/22/15 1426      Quality of Life Scores   Health/Function Pre 16.4 %   Socioeconomic Pre 27.07 %   Psych/Spiritual Pre 24 %   Family Pre 22.5 %   GLOBAL Pre 21.02 %      PHQ-9: Recent Review Flowsheet Data    Depression screen [facility] 2/9 10/22/2015   Decreased Interest 1   Down, Depressed, Hopeless 1   PHQ - 2 Score 2   Altered sleeping 2   Tired, decreased energy 1   Change in appetite 0   Feeling bad or failure about yourself  1   Trouble concentrating 0   Moving slowly or fidgety/restless 0   Suicidal thoughts 0   PHQ-9 Score 6   Difficult doing work/chores Somewhat difficult      Psychosocial Evaluation and Intervention:     Psychosocial Evaluation - 10/22/15 1527      Psychosocial Evaluation & Interventions   Interventions Stress management education;Relaxation education;Encouraged to exercise with the program and follow exercise prescription   Continued Psychosocial Services Needed Yes  Patient will be monitored through out the program to ensure exercising has improved her feelings of depression.       Psychosocial Re-Evaluation:     Psychosocial Re-Evaluation    [place] Name 11/09/15 1439 12/05/15 1507 01/09/16 1355 02/04/16 1434       Psychosocial Re-Evaluation   Interventions Encouraged to attend  Cardiac Rehabilitation for the exercise Encouraged to attend Cardiac Rehabilitation for the exercise Encouraged to attend Cardiac Rehabilitation for the exercise Encouraged to attend Cardiac Rehabilitation for the exercise    Comments Patient's QOL score was 21.02 and her PHQ-9 score was 6. Patient has had some depression since her cardiac event but she feels some improvement coming to program. She feels she does not need counseling.   Patient's QOL score was 21.02 and her PHQ-9 score was 6. Patient has had some depression since her cardiac event but feels she does not need counseling. Her depression has improved since starting the program.  Patient says she does not feel as depressed and has a better outlook overall.  Patient's feelings of depression continue to improve with exercise.     Continued Psychosocial Services Needed  -  - No No       Vocational Rehabilitation: Provide vocational rehab assistance to qualifying candidates.   Vocational Rehab Evaluation & Intervention:     Vocational Rehab - 10/22/15 1521      Initial Vocational Rehab Evaluation & Intervention   Assessment shows need for Vocational Rehabilitation No      Education: Education Goals: Education classes will be provided on a weekly basis, covering required topics. Participant will state understanding/return demonstration of topics presented.  Learning Barriers/Preferences:     Learning Barriers/Preferences - 10/22/15 1521      Learning Barriers/Preferences   Learning Barriers None   Learning Preferences Audio;Pictoral;Skilled Demonstration;Verbal Instruction;Written Material;Video      Education Topics: Hypertension, Hypertension Reduction -Define heart disease and high blood pressure. Discus how high blood pressure affects the body and ways to reduce high blood pressure. [place] RE[facility] PHASE  II EXERCISE from 01/16/2016 in [place]  Date  12/26/15  Educator  DC   Instruction Review Code  2- meets goals/outcomes      Exercise and Your Heart -Discuss why it is important to exercise, the FITT principles of exercise, normal and abnormal responses to exercise, and how to exercise safely. Flowsheet Row CARDIAC REHAB PHASE II EXERCISE from 01/16/2016 in [place]  Date  01/02/16  Educator  Russella Dar  Instruction Review Code  2- meets goals/outcomes      Angina -Discuss definition of angina, causes of angina, treatment of angina, and how to decrease risk of having angina.   Cardiac Medications -Review what the following cardiac medications are used for, how they affect the body, and side effects that may occur when taking the medications.  Medications include Aspirin, Beta blockers, calcium channel blockers, ACE Inhibitors, angiotensin receptor blockers, diuretics, digoxin, and antihyperlipidemics. Flowsheet Row CARDIAC REHAB PHASE II EXERCISE from 01/16/2016 in [place]  Date  01/16/16  Educator  Nils Flack  Instruction Review Code  2- meets goals/outcomes      Congestive Heart Failure -Discuss the definition of CHF, how to live with CHF, the signs and symptoms of CHF, and how keep track of weight and sodium intake.   Heart Disease and Intimacy -Discus the effect sexual activity has on the heart, how changes occur during intimacy as we age, and safety during sexual activity. Flowsheet Row CARDIAC REHAB PHASE II EXERCISE from 01/16/2016 in [place]  Date  10/31/15  Educator  DJ  Instruction Review Code  2- meets goals/outcomes      Smoking Cessation / COPD -Discuss different methods to quit smoking, the health benefits of quitting smoking, and the definition of COPD. Flowsheet Row CARDIAC REHAB PHASE II EXERCISE from 01/16/2016 in [place]  Date  11/07/15  Educator  DJ  Instruction Review Code  2- meets goals/outcomes      Nutrition I:  Fats -Discuss the types of cholesterol, what cholesterol does to the heart, and how cholesterol levels can be controlled.   Nutrition II: Labels -Discuss the different components of food labels and how to read food label [place] from 01/16/2016 in [place]  Date  11/21/15  Educator  Russella Dar  Instruction Review Code  2- meets goals/outcomes      Heart Parts and Heart Disease -Discuss the anatomy of the heart, the pathway of blood circulation through the heart, and these are affected by heart disease.   Stress I: Signs and Symptoms -Discuss the causes of stress, how stress may lead to anxiety and depression, and ways to limit stress. Flowsheet Row CARDIAC REHAB PHASE II EXERCISE from 01/16/2016 in [place]  Date  12/05/15  Educator  Russella Dar  Instruction Review Code  2- meets goals/outcomes      Stress II: Relaxation -Discuss different types of relaxation techniques to limit stress. Flowsheet Row CARDIAC REHAB PHASE II EXERCISE from 01/16/2016 in [place]  Date  12/12/15  Educator  Russella Dar  Instruction Review Code  2- meets goals/outcomes      Warning Signs of Stroke / TIA -Discuss definition of a stroke, what the signs and symptoms are of a stroke, and how to identify when someone is having stroke. Flowsheet Row CARDIAC REHAB PHASE II EXERCISE from 01/16/2016 in [place]  Date  12/19/15  Educator  Hart [place]  Instruction Review Code  2- meets goals/outcomes      Knowledge Questionnaire Score:     Knowledge Questionnaire Score - 10/22/15 1521      Knowledge Questionnaire Score   Pre Score 23/24      Core Components/Risk Factors/Patient Goals at Admission:     Personal Goals and Risk Factors at Admission - 10/22/15 1522      Core Components/Risk Factors/Patient Goals on Admission    Weight Management Weight  Maintenance   Increase Strength and Stamina Yes   Intervention Provide advice, education, support and counseling about physical activity/exercise needs.;Develop an individualized exercise prescription for aerobic and resistive training based on initial evaluation findings, risk stratification, comorbidities and participant's personal goals.   Expected Outcomes Achievement of increased cardiorespiratory fitness and enhanced flexibility, muscular endurance and strength shown through measurements of functional capacity and personal statement of participant.   Diabetes Yes   Intervention Provide education about signs/symptoms and action to take for hypo/hyperglycemia.   Expected Outcomes Long Term: Attainment of HbA1C < 7%.   Stress Yes   Intervention Offer individual and/or small group education and counseling on adjustment to heart disease, stress management and health-related lifestyle change. Teach and support self-help strategies.   Expected Outcomes Short Term: Participant demonstrates changes in health-related behavior, relaxation and other stress management skills, ability to obtain effective social support, and compliance with psychotropic medications if prescribed.;Long Term: Emotional wellbeing is indicated by absence of clinically significant psychosocial distress or social isolation.   Personal Goal Other Yes   Personal Goal Patient wants to increase her strenght and stamina. She also want to make regular exercise a part of her lifestyle.     Intervention Patient will exercise 3 days/week in program and supplement with 2 days/week at home.    Expected Outcomes Patient will meet her above stated goals.       Core Components/Risk Factors/Patient Goals Review:      Goals and Risk Factor Review    Row Name 10/22/15 1526 11/09/15 1437 12/05/15 1501 01/09/16 1353 02/04/16 1430     Core Components/Risk Factors/Patient Goals Review   Personal Goals Review Increase Strength and Stamina;Weight  Management/Obesity;Stress Weight Management/Obesity;Increase Strength and Stamina;Other  Patient wants to increase her strenght and stamina. She also want to make regular exercise a part of her lifestyle.  Increase Strength and Stamina;Other  Patient wants exercise to be a regular part of her lifestyle.  Weight Management/Obesity;Increase Strength and Stamina;Other  Make exercise a part of her lifestyle.  Increase Strength and Stamina  Make exercise a part of her lifestyle.   Review  - After 5 sessions, patient has gained 0.5 lbs. She is doing well in the program. She says she feels better and has seen a slight improvement in her strength.  After 11 sessions, patient has lost 5 lbs. She is doing well in the program. Her strength and stamina are improved. She was hospitalized for a few days with vomiting and general malaise. She says her cardiologist discussed a internal defrillator placement. She has also missed some sessions with a URI. Her b/p continues to be low. Her PCP is adjusting her medications.  Patient has attended 26 sessions. She has lost 5.5 lbs and her strength and stamina have increased. She says she feels better and plans to continue exercising after graduation.  Patient's last day of attendance was 01/16/16 at 27 sessions. She had an internal defibrillator implanted 01/23/16. She plans to return to complete the  program when she is released by her cardiologist.    Expected Outcomes  - Patient will continue to attend sessions maintaining her weight with improved strength and stamina.  Patient will continue to attend sessions and complete the program meeting the above stated goals.  Patient will complete the program meeting her personal goals.  Patient will return and complete the program meeting her personal goals.       Core Components/Risk Factors/Patient Goals at Discharge (Final Review):      Goals and Risk Factor Review - 02/04/16 1430      Core Components/Risk Factors/Patient  Goals Review   Personal Goals Review Increase Strength and Stamina  Make exercise a part of her lifestyle.   Review Patient's last day of attendance was 01/16/16 at 27 sessions. She had an internal defibrillator implanted 01/23/16. She plans to return to complete the program when she is released by her cardiologist.    Expected Outcomes Patient will return and complete the program meeting her personal goals.       ITP Comments:     ITP Comments    Row Name 11/01/15 0900 02/28/16 1520         ITP Comments Patient met with Registered Dietitian to discuss nutrition topics including: Heart healthty eating, heart healthy cooking and making smart choices when shopping; Portion control; weight management; and hydration. He also attended a class with hospital chaplian called [facility] to discuss how his diagnosis has impacted his life. Patient stopped attending after 27 sessions. She had an internal defibrillator implanted and never returned after procedure. Discharge letter mailed.          Comments: Patient stopped coming to Cardiac Rehab on 01/16/16 after 27 sessions. She had an internal defibrillator implanted and never returned after procedure. Discharge letter mailed. Doctor will be informed.

## 2016-02-28 NOTE — Addendum Note (Signed)
Encounter addended by: Suann Larry, RN on: 02/28/2016  3:28 PM<BR>    Actions taken: Visit Navigator Flowsheet section accepted, Sign clinical note, Episode resolved

## 2016-03-17 ENCOUNTER — Encounter (HOSPITAL_COMMUNITY): Payer: Self-pay

## 2016-03-17 ENCOUNTER — Ambulatory Visit (HOSPITAL_COMMUNITY)
Admission: RE | Admit: 2016-03-17 | Discharge: 2016-03-17 | Disposition: A | Discharge status: Home / Self Care | Payer: BLUE CROSS/BLUE SHIELD | Source: Ambulatory Visit | Attending: Cardiology | Admitting: Cardiology

## 2016-03-17 VITALS — BP 93/59 | HR 70 | Wt 141.2 lb

## 2016-03-17 DIAGNOSIS — Z7902 Long term (current) use of antithrombotics/antiplatelets: Secondary | ICD-10-CM | POA: Insufficient documentation

## 2016-03-17 DIAGNOSIS — I2510 Atherosclerotic heart disease of native coronary artery without angina pectoris: Secondary | ICD-10-CM | POA: Insufficient documentation

## 2016-03-17 DIAGNOSIS — I255 Ischemic cardiomyopathy: Secondary | ICD-10-CM | POA: Diagnosis not present

## 2016-03-17 DIAGNOSIS — I252 Old myocardial infarction: Secondary | ICD-10-CM | POA: Insufficient documentation

## 2016-03-17 DIAGNOSIS — I480 Paroxysmal atrial fibrillation: Secondary | ICD-10-CM | POA: Insufficient documentation

## 2016-03-17 DIAGNOSIS — Z95810 Presence of automatic (implantable) cardiac defibrillator: Secondary | ICD-10-CM | POA: Insufficient documentation

## 2016-03-17 DIAGNOSIS — E785 Hyperlipidemia, unspecified: Secondary | ICD-10-CM | POA: Insufficient documentation

## 2016-03-17 DIAGNOSIS — I5022 Chronic systolic (congestive) heart failure: Secondary | ICD-10-CM | POA: Diagnosis not present

## 2016-03-17 DIAGNOSIS — I340 Nonrheumatic mitral (valve) insufficiency: Secondary | ICD-10-CM | POA: Diagnosis not present

## 2016-03-17 DIAGNOSIS — Z7901 Long term (current) use of anticoagulants: Secondary | ICD-10-CM | POA: Diagnosis not present

## 2016-03-17 LAB — BASIC METABOLIC PANEL
Anion gap: 9 (ref 5–15)
BUN: 18 mg/dL (ref 6–20)
CO2: 28 mmol/L (ref 22–32)
Calcium: 9.1 mg/dL (ref 8.9–10.3)
Chloride: 102 mmol/L (ref 101–111)
Creatinine, Ser: 1.25 mg/dL — ABNORMAL HIGH (ref 0.44–1.00)
GFR calc Af Amer: 57 mL/min — ABNORMAL LOW (ref >60)
GFR calc non Af Amer: 49 mL/min — ABNORMAL LOW (ref >60)
Glucose, Bld: 121 mg/dL — ABNORMAL HIGH (ref 65–99)
Potassium: 3.9 mmol/L (ref 3.5–5.1)
Sodium: 139 mmol/L (ref 135–145)

## 2016-03-17 LAB — BRAIN NATRIURETIC PEPTIDE: B Natriuretic Peptide: 685.3 pg/mL — ABNORMAL HIGH (ref 0.0–100.0)

## 2016-03-17 MED ORDER — FUROSEMIDE 20 MG PO TABS: 40.0000 mg | ORAL_TABLET | Freq: Every day | ORAL | 3 refills | Status: DC

## 2016-03-17 MED ORDER — LOSARTAN POTASSIUM 25 MG PO TABS: 25.0000 mg | ORAL_TABLET | Freq: Every day | ORAL | 3 refills | Status: DC

## 2016-03-17 NOTE — Patient Instructions (Signed)
Labs today (will call for abnormal results, otherwise no news is good news)  Increase Losartan to 25 mg (1 Tablet) Once daily at bedtime  Continue taking Lasix 40 mg (2 Tablets) Once daily  Please take prescription to PCP and have labs drawn in 2 weeks  Follow up in 1 Month

## 2016-03-17 NOTE — Progress Notes (Signed)
PCP: Dr. Margo Aye Cardiology: Dr. Clifton James HF Cardiology: Dr. Shirlee Latch  51 yo with history of CAD s/p anterior STEMI with DES to LAD, ischemic cardiomyopathy, mitral regurgitation, and paroxysmal atrial fibrillation presents for CHF clinic evaluation.  She was admitted in 7/17 with anterior STEMI.  She had a late presentation to the cath lab.  Most recent echo in 10/17 showed EF about 25% with LAD-territory wall motion abnormalities.  She had peri-MI atrial fibrillation and was started on Xarelto.  She was put on amiodarone.   After discharge, she continued to feel poorly.  She developed nausea, anorexia, and significant fatigue.  She was admitted for evaluation in 10/17 given concern for low output heart failure.  However, stopping amiodarone essentially resolved her symptoms.  She had RHC showing preserved cardiac output but the PCWP was high due to prominent v-waves, presumably from significant mitral regurgitation.  Of note, she was in atrial fibrillation for a time during this admission.   She had St Jude ICD placed.  She is back at work for the [employer].  At last appointment, I decreased her Lasix to 20 mg daily, but she felt somewhat worse and developed ankle edema so she increased it back to 40 mg daily.  Generally feels like she is doing well.  She is short of breath if she walks up stairs fast, otherwise no dyspnea doing her usual ADLs. No problems walking on flat ground.  No chest pain.  No orthopnea/PND.  She has had 2 episodes since I last saw her when she felt her heart rate go up for about 3 hours at a time.  She measured HR around 110s during those episodes.  She is in NSR today.      Labs (10/17): K 3.9, creatinine 1.24, hgb 10.3 Labs (11/17): K 4, creatinine 1.27 Labs (12/17): K 4.2, creatinine 1.12, BNP 439  St Jude ICD interrogated, no VT or atrial fibrillation recorded.   PMH:  1. CAD: Anterior STEMI in 7/17 with late presentation to the cath lab. She had DES to  proximal LAD.  No significant disease in other vessels.  2. Chronic systolic CHF: Ischemic cardiomyopathy.  - Echo 10/17 with EF 25%, akinesis of the mid-apical anteroseptal and anterior walls, apical dyskinesis, moderate to severe MR with incomplete coaptation.  - RHC (10/17): mean RA 4, PA 59/17 mean 38, mean PCWP 27 with prominent V waves suggesting significant MR, CI 3.06, PVR 2 WU.  - ACEI cough.  - Echo (11/17): EF 30-35%, normal RV size and systolic function, severe MR with restriction of posterior leaflet.   3. Atrial fibrillation: Paroxysmal.  Noted 7/17 admission peri-MI, also noted again 10/17 admission. She did not tolerate amiodarone due to nausea/anorexia.  4. Mitral regurgitation: Moderate to severe on 10/17 echo, incomplete coaptation.  ?Etiology, her MI (anterior) does not typically lead to ischemic MR.   - Echo in 11/17 with severe MR, posterior leaflet restricted.  5. Hyperlipidemia  SH: Married, lives in [place], works for the [employer], no smoking or ETOH.   Family History  Problem Relation Age of Onset  . Heart attack Father   . Arrhythmia Mother    ROS: All systems reviewed and negative except as per HPI.   Current Outpatient Prescriptions  Medication Sig Dispense Refill  . atorvastatin (LIPITOR) 80 MG tablet Take 1 tablet (80 mg total) by mouth daily at 6 PM. 30 tablet 11  . Calcium Carb-Cholecalciferol (CALCIUM + D3 PO) Take 1 tablet by mouth every morning.    Marland Kitchen  carvedilol (COREG) 3.125 MG tablet Take 1 tablet (3.125 mg total) by mouth 2 (two) times daily with a meal. 60 tablet 6  . furosemide (LASIX) 20 MG tablet Take 2 tablets (40 mg total) by mouth daily. 30 tablet 4  . pantoprazole (PROTONIX) 40 MG tablet Take 1 tablet (40 mg total) by mouth daily. 30 tablet 11  . potassium chloride SA (K-DUR,KLOR-CON) 20 MEQ tablet Take 3 tablets (60 mEq total) by mouth daily. 90 tablet 6  . Rivaroxaban (XARELTO) 15 MG TABS tablet Take 1 tablet (15 mg total) by mouth daily with  supper. 30 tablet 6  . SYNTHROID 125 MCG tablet Take 125 mcg by mouth every morning.  5  . ticagrelor (BRILINTA) 90 MG TABS tablet Take 1 tablet (90 mg total) by mouth 2 (two) times daily. 180 tablet 3  . traZODone (DESYREL) 50 MG tablet Take 0.5 tablets (25 mg total) by mouth at bedtime as needed for sleep. 30 tablet 0  . triamcinolone (NASACORT ALLERGY 24HR) 55 MCG/ACT AERO nasal inhaler Place 2 sprays into the nose daily.    Marland Kitchen losartan (COZAAR) 25 MG tablet Take 0.5 tablets (12.5 mg total) by mouth at bedtime. 15 tablet 3  . nitroGLYCERIN (NITROSTAT) 0.4 MG SL tablet Place 1 tablet (0.4 mg total) under the tongue every 5 (five) minutes as needed for chest pain. (Patient not taking: Reported on 12/11/2015) 25 tablet 3    BP (!) 93/59   Pulse 70   Wt 141 lb 4 oz (64.1 kg)   SpO2 100%   BMI 20.86 kg/m  General: NAD Neck: No JVD, no thyromegaly or thyroid nodule.  Lungs: Clear to auscultation bilaterally with normal respiratory effort. CV: Nondisplaced PMI.  Heart regular S1/S2, no S3/S4, 1/6 HSM at apex.  No peripheral edema.  No carotid bruit.  Normal pedal pulses.  Abdomen: Soft, nontender, no hepatosplenomegaly, no distention.  Skin: Intact without lesions or rashes.  Neurologic: Alert and oriented x 3.  Psych: Normal affect. Extremities: No clubbing or cyanosis.  HEENT: Normal.   Assessment/Plan: 1. CAD: No ischemic-type chest pain.  S/p anterior MI in 7/17 with DES to RCA.   - Continue Xarelto 15 mg daily and Plavix 75 daily => continue Plavix to finish out a year post-PCI, then stop Plavix and increase Xarelto to 20 mg daily.  No aspirin given Xarelto use.  - Continue statin.  2. Chronic systolic CHF: Ischemic cardiomyopathy.  EF 25% on echo in 10/17, 30-35% on echo in 11/17. She has extensive LAD-territory scar. 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  She is not volume overloaded on exam. NYHA class II symptoms.  BP runs low.  - Continue Coreg 3.125 mg bid and spironolactone.    - Increase  losartan to 25 mg qhs.  BMET today and again in 10 days.   - Continue Lasix 40 mg daily.    3. Mitral regurgitation: Moderate to severe on 10/17 echo, severe on 11/17 echo.  The posterior leaflet appeared restricted.  I do not think that this is ischemic MR.  Patient tells me that she has been told that she has had a murmur for years but never had an echo prior, I am concerned that she has had pre-existing primary mitral regurgitation that may end up needing to be repaired. Her murmur today, however, is less prominent.  Will continue to monitor, consider TEE eventually.   4. Atrial fibrillation: Unable to tolerate amiodarone due to GI side effects.  She is in NSR  today on Xarelto 15 mg daily. She has felt like she went back into atrial fibrillation a couple of times briefly, but nothing was noted on device interrogation.  If atrial fibrillation becomes a problem in the future, she would be a Tikosyn or ablation candidate.   Followup in 1 month for medication titration.  Marca Ancona 03/17/2016

## 2016-04-02 ENCOUNTER — Encounter: Payer: Self-pay | Admitting: Internal Medicine

## 2016-04-02 ENCOUNTER — Other Ambulatory Visit: Payer: Self-pay | Admitting: Internal Medicine

## 2016-04-10 ENCOUNTER — Telehealth (HOSPITAL_COMMUNITY): Payer: Self-pay | Admitting: *Deleted

## 2016-04-10 NOTE — Telephone Encounter (Signed)
Patient called in asking if I could ask Dr. Shirlee Latch to review lab results that were scanned in from an outside source.  She was concerned about her kidney function.  I told her I would send Dr. Shirlee Latch a message and will call her back with his response.

## 2016-04-10 NOTE — Telephone Encounter (Signed)
Dr. Shirlee Latch responded saying labs are not significantly abnormal and to recheck in 2 weeks.  Patient has office visit end of next week and I have added to the office visit notes to recheck BMET.  I called patient back and told her the plan and she understands with no further questions.

## 2016-04-14 ENCOUNTER — Telehealth: Payer: Self-pay | Admitting: Cardiovascular Disease

## 2016-04-14 NOTE — Telephone Encounter (Signed)
Patient will have a routine cleaning and would like to know if she needs to stop a her blood thinner for a few days prior to cleaning and also if she needs to take any antibiotic medications prior to cleaning. Please call, thanks.

## 2016-04-14 NOTE — Telephone Encounter (Signed)
Patient wanted to know if she needed any antibiotic for her dental cleaning and does she need to hold her Plavix and xarelto. Informed patient that antibiotics are given if patient  has had a valve replacement, but will forward to Dr. Clifton James about holding blood thinners. Patient will need response faxed to her dentist. Fax number [phone] for  Dr. Craig Guess office.

## 2016-04-15 ENCOUNTER — Ambulatory Visit: Payer: BLUE CROSS/BLUE SHIELD | Admitting: Cardiology

## 2016-04-15 NOTE — Telephone Encounter (Signed)
Patient returning phone call. Patient made aware of Dr. Gibson Ramp recommendations. Patient verbalized understanding.

## 2016-04-15 NOTE — Telephone Encounter (Signed)
Left message for patient to call back  

## 2016-04-15 NOTE — Telephone Encounter (Signed)
Can we ask her to review these questions with Dr. Shirlee Latch when she sees him this Thursday? Antibiotics are not necessary prior to dental appointments when patients have coronary stents or ICDs. Typically we do not hold antiplatelet therapy or anticoagulation prior to dental cleaning. Thanks, chris

## 2016-04-16 ENCOUNTER — Encounter: Payer: Self-pay | Admitting: Cardiology

## 2016-04-17 ENCOUNTER — Encounter (HOSPITAL_COMMUNITY): Payer: Self-pay

## 2016-04-17 ENCOUNTER — Ambulatory Visit (HOSPITAL_COMMUNITY)
Admission: RE | Admit: 2016-04-17 | Discharge: 2016-04-17 | Disposition: A | Discharge status: Home / Self Care | Payer: BLUE CROSS/BLUE SHIELD | Source: Ambulatory Visit | Attending: Internal Medicine | Admitting: Internal Medicine

## 2016-04-17 VITALS — BP 88/64 | HR 76 | Wt 140.0 lb

## 2016-04-17 DIAGNOSIS — I255 Ischemic cardiomyopathy: Secondary | ICD-10-CM | POA: Diagnosis not present

## 2016-04-17 DIAGNOSIS — I2510 Atherosclerotic heart disease of native coronary artery without angina pectoris: Secondary | ICD-10-CM | POA: Insufficient documentation

## 2016-04-17 DIAGNOSIS — I340 Nonrheumatic mitral (valve) insufficiency: Secondary | ICD-10-CM | POA: Diagnosis not present

## 2016-04-17 DIAGNOSIS — Z95810 Presence of automatic (implantable) cardiac defibrillator: Secondary | ICD-10-CM | POA: Diagnosis not present

## 2016-04-17 DIAGNOSIS — I480 Paroxysmal atrial fibrillation: Secondary | ICD-10-CM | POA: Insufficient documentation

## 2016-04-17 DIAGNOSIS — I5022 Chronic systolic (congestive) heart failure: Secondary | ICD-10-CM | POA: Diagnosis not present

## 2016-04-17 DIAGNOSIS — Z7901 Long term (current) use of anticoagulants: Secondary | ICD-10-CM | POA: Diagnosis not present

## 2016-04-17 DIAGNOSIS — Z7902 Long term (current) use of antithrombotics/antiplatelets: Secondary | ICD-10-CM | POA: Insufficient documentation

## 2016-04-17 DIAGNOSIS — I252 Old myocardial infarction: Secondary | ICD-10-CM | POA: Insufficient documentation

## 2016-04-17 DIAGNOSIS — I959 Hypotension, unspecified: Secondary | ICD-10-CM | POA: Insufficient documentation

## 2016-04-17 DIAGNOSIS — E785 Hyperlipidemia, unspecified: Secondary | ICD-10-CM | POA: Diagnosis not present

## 2016-04-17 NOTE — Progress Notes (Signed)
Advanced Heart Failure Medication Review by a Pharmacist  Does the patient  feel that his/her medications are working for him/her?  yes  Has the patient been experiencing any side effects to the medications prescribed?  no  Does the patient measure his/her own blood pressure or blood glucose at home?  yes   Does the patient have any problems obtaining medications due to transportation or finances?   No - all meds $0  Understanding of regimen: good Understanding of indications: good Potential of compliance: good Patient understands to avoid NSAIDs. Patient understands to avoid decongestants.  Issues to address at subsequent visits: None   Pharmacist comments:  Catherine Mays is a pleasant 51 yo F presenting with her husband and her medication bottles. She reports good compliance with her regimen. Her only concern is the cost of her [employer] life vest. She states that her [employer] is charging her $11000 and they cannot afford this. We will follow up with [employer] rep to see if there are any options for her.   Catherine Mays. Catherine Mays, PharmD, BCPS, CPP Clinical Pharmacist Pager: [phone] Phone: [phone] 04/17/2016 10:15 AM      Time with patient: 16 minutes Preparation and documentation time: 2 minutes Total time: 18 minutes

## 2016-04-17 NOTE — Patient Instructions (Addendum)
Our [employer] fast streamer with [employer] has been notified of your billing claim.  We will update you as we help resolve this issue.  You are cleared from cardiac perspective for routine dental cleaning and do not require prophylactic antibiotics and can take your Xarelto as scheduled.  Follow up 2 months with Dr. Shirlee Latch.  Do the following things EVERYDAY: 1) Weigh yourself in the morning before breakfast. Write it down and keep it in a log. 2) Take your medicines as prescribed 3) Eat low salt foods-Limit salt (sodium) to 2000 mg per day.  4) Stay as active as you can everyday 5) Limit all fluids for the day to less than 2 liters

## 2016-04-17 NOTE — Progress Notes (Signed)
PCP: Dr. Margo Aye Cardiology: Dr. Clifton James HF Cardiology: Dr. Shirlee Latch    Advanced Heart Failure Clinic Note   51 yo with history of CAD s/p anterior STEMI with DES to LAD, ischemic cardiomyopathy, mitral regurgitation, and paroxysmal atrial fibrillation presents for CHF clinic evaluation.  She was admitted in 7/17 with anterior STEMI.  She had a late presentation to the cath lab.  Most recent echo in 10/17 showed EF about 25% with LAD-territory wall motion abnormalities.  She had peri-MI atrial fibrillation and was started on Xarelto.  She was put on amiodarone.   After discharge, she continued to feel poorly.  She developed nausea, anorexia, and significant fatigue.  She was admitted for evaluation in 10/17 given concern for low output heart failure.  However, stopping amiodarone essentially resolved her symptoms.  She had RHC showing preserved cardiac output but the PCWP was high due to prominent v-waves, presumably from significant mitral regurgitation.  Of note, she was in atrial fibrillation for a time during this admission.   She presents today for regular follow up.  At last visit losartan increased.  Tolerating well. No lightheadedness or dizziness. No CP.  No overt DOE. Can walk on flat ground without SOB. Mild SOB with stairs or steep incline. Tries to avoid.  Denies any edema.  No palpitations since last visit.  Working at the [employer] with work. She is having trouble with [employer] billing, which should have been covered by her insurance.  Awaiting word from [employer].  Labs (10/17): K 3.9, creatinine 1.24, hgb 10.3 Labs (11/17): K 4, creatinine 1.27 Labs (12/17): K 4.2, creatinine 1.12, BNP 439  St Jude ICD interrogation: No VT/VF. No Afib noticed.   PMH:  1. CAD: Anterior STEMI in 7/17 with late presentation to the cath lab. She had DES to proximal LAD.  No significant disease in other vessels.  2. Chronic systolic CHF: Ischemic cardiomyopathy.  - Echo 10/17 with EF 25%, akinesis of the  mid-apical anteroseptal and anterior walls, apical dyskinesis, moderate to severe MR with incomplete coaptation.  - RHC (10/17): mean RA 4, PA 59/17 mean 38, mean PCWP 27 with prominent V waves suggesting significant MR, CI 3.06, PVR 2 WU.  - ACEI cough.  - Echo (11/17): EF 30-35%, normal RV size and systolic function, severe MR with restriction of posterior leaflet.   3. Atrial fibrillation: Paroxysmal.  Noted 7/17 admission peri-MI, also noted again 10/17 admission. She did not tolerate amiodarone due to nausea/anorexia.  4. Mitral regurgitation: Moderate to severe on 10/17 echo, incomplete coaptation.  ?Etiology, her MI (anterior) does not typically lead to ischemic MR.   - Echo in 11/17 with severe MR, posterior leaflet restricted.  5. Hyperlipidemia  SH: Married, lives in [place], works for the [employer], no smoking or ETOH.   Family History  Problem Relation Age of Onset  . Heart attack Father   . Arrhythmia Mother    ROS: All systems reviewed and negative except as per HPI.   Current Meds  Medication Sig  . atorvastatin (LIPITOR) 80 MG tablet Take 1 tablet (80 mg total) by mouth daily at 6 PM.  . Calcium Carb-Cholecalciferol (CALCIUM + D3 PO) Take 1 tablet by mouth every morning.  . carvedilol (COREG) 3.125 MG tablet Take 1 tablet (3.125 mg total) by mouth 2 (two) times daily with a meal.  . clopidogrel (PLAVIX) 75 MG tablet Take 1 tablet (75 mg total) by mouth daily.  . fexofenadine (ALLEGRA) 180 MG tablet Take 180 mg by  mouth daily.  . furosemide (LASIX) 20 MG tablet Take 2 tablets (40 mg total) by mouth daily.  Marland Kitchen losartan (COZAAR) 25 MG tablet Take 1 tablet (25 mg total) by mouth at bedtime.  . pantoprazole (PROTONIX) 40 MG tablet Take 1 tablet (40 mg total) by mouth daily.  . potassium chloride SA (K-DUR,KLOR-CON) 20 MEQ tablet Take 3 tablets (60 mEq total) by mouth daily.  . Rivaroxaban (XARELTO) 15 MG TABS tablet Take 1 tablet (15 mg total) by mouth daily with supper. Resume  evening of 01/25/16  . spironolactone (ALDACTONE) 25 MG tablet Take 0.5 tablets (12.5 mg total) by mouth daily.  Marland Kitchen SYNTHROID 125 MCG tablet Take 125 mcg by mouth every morning.  . traZODone (DESYREL) 50 MG tablet Take 0.5 tablets (25 mg total) by mouth at bedtime as needed for sleep.  Marland Kitchen triamcinolone (NASACORT ALLERGY 24HR) 55 MCG/ACT AERO nasal inhaler Place 2 sprays into the nose daily.    BP (!) 88/64 (BP Location: Left Arm, Patient Position: Sitting, Cuff Size: Normal)   Pulse 76   Wt 140 lb (63.5 kg)   SpO2 98%   BMI 20.67 kg/m    Wt Readings from Last 3 Encounters:  04/17/16 140 lb (63.5 kg)  03/17/16 141 lb 4 oz (64.1 kg)  02/11/16 143 lb 11.2 oz (65.2 kg)    General: Well appearing, NAD.  Neck: No JVD, no thyromegaly or thyroid nodule.  Lungs: CTAB, normal effort. CV: Nondisplaced PMI.  Heart regular S1/S2, no S3/S4, 1/6 HSM at apex.  No carotid bruit.  Normal pedal pulses.  Abdomen: Soft, NT, ND, no HSM. No bruits or masses. +BS  Skin: Intact without lesions or rashes.  Neurologic: Alert and oriented x 3.  Psych: Normal effect.  Extremities: No clubbing or cyanosis. No peripheral edema.  HEENT: Normal.   Assessment/Plan: 1. CAD: No ischemic-type chest pain.  S/p anterior MI in 7/17 with DES to RCA.   - Continue Xarelto 15 mg daily and Plavix 75 daily. Will finish placix course 08/2016 (year after PCI) Will increase Xarelto at that time. - Continue atorvastatin 80 mg daily.  2. Chronic systolic CHF: Ischemic cardiomyopathy.  EF 25% on echo in 10/17, 30-35% on echo in 11/17. She has extensive LAD-territory scar. 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   - NYHA Class II symptoms and volume status stable on exam. BP low chronically.  - Continue Coreg 3.125 mg BID - Continue Spironolactone 12.5 mg daily.  - Continue losartan 25 mg QHS. Recent BMET stable.   - Continue Lasix 40 mg daily.    - No room to uptitrate meds with BP in 80s.  - Reinforced fluid restriction to < 2 L daily, sodium  restriction to less than 2000 mg daily, and the importance of daily weights.   3. Mitral regurgitation: Moderate to severe on 10/17 echo, severe on 11/17 echo.  The posterior leaflet appeared restricted. Low suspicion of ischemic MR. Per pt has long hx of murmur.  - May eventually need TEE. Can consider with symptoms or murmur worsening. Un-impressive this am.   4. Atrial fibrillation: Unable to tolerate amiodarone due to GI side effects.  - Remains in NSR by exam and [place].  - Continue Xarelto 15 mg daily.  No bleeding.  - Could consider Tikosyn vs ablation if recurrent in future.   5. Hypotension - Chronic. Asymptomatic. Limits med titration.   Overall stable. No room to uptitrate meds.  Follow up 2 months. Labs stable within past few  weeks.   Graciella Freer, PA-C  04/17/2016

## 2016-04-18 ENCOUNTER — Other Ambulatory Visit (HOSPITAL_COMMUNITY): Payer: Self-pay | Admitting: Cardiology

## 2016-04-21 ENCOUNTER — Other Ambulatory Visit (HOSPITAL_COMMUNITY): Payer: Self-pay | Admitting: Cardiology

## 2016-04-21 MED ORDER — LOSARTAN POTASSIUM 25 MG PO TABS: 25.0000 mg | ORAL_TABLET | Freq: Every day | ORAL | 3 refills | Status: DC

## 2016-04-25 ENCOUNTER — Ambulatory Visit (INDEPENDENT_AMBULATORY_CARE_PROVIDER_SITE_OTHER): Payer: BLUE CROSS/BLUE SHIELD | Admitting: Cardiology

## 2016-04-25 ENCOUNTER — Encounter: Payer: Self-pay | Admitting: Cardiology

## 2016-04-25 VITALS — BP 90/66 | HR 66 | Ht 69.0 in | Wt 142.6 lb

## 2016-04-25 DIAGNOSIS — Z95810 Presence of automatic (implantable) cardiac defibrillator: Secondary | ICD-10-CM | POA: Diagnosis not present

## 2016-04-25 DIAGNOSIS — I5022 Chronic systolic (congestive) heart failure: Secondary | ICD-10-CM | POA: Diagnosis not present

## 2016-04-25 DIAGNOSIS — I255 Ischemic cardiomyopathy: Secondary | ICD-10-CM | POA: Diagnosis not present

## 2016-04-25 LAB — CUP PACEART INCLINIC DEVICE CHECK
Brady Statistic RV Percent Paced: 0 %
Date Time Interrogation Session: 20180302121331
HighPow Impedance: 64.125
Implantable Lead Implant Date: 20171129
Implantable Lead Location: 753860
Implantable Pulse Generator Implant Date: 20171129
Lead Channel Impedance Value: 412.5 Ohm
Lead Channel Pacing Threshold Amplitude: 0.5 V
Lead Channel Pacing Threshold Pulse Width: 0.5 ms
Lead Channel Sensing Intrinsic Amplitude: 12 mV
Lead Channel Setting Pacing Amplitude: 2.5 V
Lead Channel Setting Pacing Pulse Width: 0.5 ms
Lead Channel Setting Sensing Sensitivity: 0.5 mV
Pulse Gen Serial Number: 7377983

## 2016-04-25 MED ORDER — RIVAROXABAN 15 MG PO TABS: 15.0000 mg | ORAL_TABLET | Freq: Every day | ORAL | 6 refills | Status: DC

## 2016-04-25 MED ORDER — RIVAROXABAN 20 MG PO TABS: 20.0000 mg | ORAL_TABLET | Freq: Every day | ORAL | 9 refills | Status: DC

## 2016-04-25 NOTE — Addendum Note (Signed)
Addended by: Baird Lyons on: 04/25/2016 01:42 PM   Modules accepted: Orders

## 2016-04-25 NOTE — Progress Notes (Addendum)
Electrophysiology Office Note   Date:  04/25/2016   ID:  Catherine Mays, DOB [birth year], MRN [id number]  PCP:  Dwana Melena, MD  Cardiologist:  Janett Labella Primary Electrophysiologist:  Regan Lemming, MD    Chief Complaint  Patient presents with  . DEFIB CHECK    Ischemic cardiomyopathy     History of Present Illness: Catherine Mays is a 51 y.o. female who presents today for electrophysiology evaluation.   History of CAD (STEMI 08/2015 s/p PTCA/DES to mid LAD, mild residual mRCA), ischemic cardiomyopathy, chronic systolic CHF, paroxysmal atrial fib (dx at time of STEMI), PVCs, hypotension. She had presented to the [facility] ED with chest pain that night and was sent home. She came back in later that night with continued chest pain and EKG showed ST elevation c/w an anterior MI. Emergent transport to [facility] and cardiac cath with occluded mid LAD treated with DES x 1 on 09/20/15. She was discharged with a LifeVest for LV dysfunction, amiodarone for her atrial fib, and triple blood thinner therapy for both her MI and afib. ASA stopped after one month. Coreg cut back due to fatigue. Aldactone stopped due to worsened kidney function. She was taken off amiodarone due to poor appetite, 20 lb weight loss, nausea, dyspnea, weakness, fatigue and cough. ICD implanted 01/23/16.  Today, she denies symptoms of palpitations, chest pain, SOB, orthopnea, PND, lower extremity edema, claudication, dizziness, presyncope, syncope, bleeding, or neurologic sequela. The patient is tolerating medications without difficulties and is otherwise without complaint today.    Past Medical History:  Diagnosis Date  . CAD in native artery   . Chronic systolic CHF (congestive heart failure) (HCC)   . Hypotension    a. preventing med titration for HF.  Marland Kitchen Hypothyroidism 09/24/2015  . Ischemic cardiomyopathy   . PAF (paroxysmal atrial fibrillation) (HCC) 09/24/2015   a. dx at time of STEMI 08/2015.  .  Pre-diabetes   . PVC's (premature ventricular contractions)    Past Surgical History:  Procedure Laterality Date  . CARDIAC CATHETERIZATION N/A 09/20/2015   Procedure: Left Heart Cath and Coronary Angiography;  Surgeon: Kathleene Hazel, MD;  Location: [facility] INVASIVE CV LAB;  Service: Cardiovascular;  Laterality: N/A;  . CARDIAC CATHETERIZATION N/A 09/20/2015   Procedure: Coronary Stent Intervention;  Surgeon: Kathleene Hazel, MD;  Location: [facility] INVASIVE CV LAB;  Service: Cardiovascular;  Laterality: N/A;  . CARDIAC CATHETERIZATION N/A 09/21/2015   Procedure: Left Heart Cath and Coronary Angiography;  Surgeon: Tonny Bollman, MD;  Location: [facility] INVASIVE CV LAB;  Service: Cardiovascular;  Laterality: N/A;  . CARDIAC CATHETERIZATION N/A 11/26/2015   Procedure: Right Heart Cath;  Surgeon: [name], MD;  Location: [facility] INVASIVE CV LAB;  Service: Cardiovascular;  Laterality: N/A;  . CORONARY STENT PLACEMENT  09/20/2015  . EP IMPLANTABLE DEVICE N/A 01/23/2016   Procedure: ICD Implant;  Surgeon: Leanor Voris Jorja Loa, MD;  Location: [facility] INVASIVE CV LAB;  Service: Cardiovascular;  Laterality: N/A;  . THYROIDECTOMY       Current Outpatient Prescriptions  Medication Sig Dispense Refill  . atorvastatin (LIPITOR) 80 MG tablet Take 1 tablet (80 mg total) by mouth daily at 6 PM. 30 tablet 11  . Calcium Carb-Cholecalciferol (CALCIUM + D3 PO) Take 1 tablet by mouth every morning.    . carvedilol (COREG) 3.125 MG tablet Take 1 tablet (3.125 mg total) by mouth 2 (two) times daily with a meal. 60 tablet 6  . clopidogrel (PLAVIX) 75 MG tablet Take 1 tablet (  75 mg total) by mouth daily. 90 tablet 3  . fexofenadine (ALLEGRA) 180 MG tablet Take 180 mg by mouth daily.    . furosemide (LASIX) 20 MG tablet Take 2 tablets (40 mg total) by mouth daily. 30 tablet 3  . losartan (COZAAR) 25 MG tablet Take 1 tablet (25 mg total) by mouth at bedtime. 90 tablet 3  . nitroGLYCERIN (NITROSTAT) 0.4 MG SL tablet Place  1 tablet (0.4 mg total) under the tongue every 5 (five) minutes as needed for chest pain. 25 tablet 3  . pantoprazole (PROTONIX) 40 MG tablet Take 1 tablet (40 mg total) by mouth daily. 30 tablet 11  . potassium chloride SA (K-DUR,KLOR-CON) 20 MEQ tablet Take 3 tablets (60 mEq total) by mouth daily. 90 tablet 6  . Rivaroxaban (XARELTO) 20 MG TABS tablet Take 1 tablet (20 mg total) by mouth daily with supper. 30 tablet 9  . spironolactone (ALDACTONE) 25 MG tablet Take 0.5 tablets (12.5 mg total) by mouth daily. 45 tablet 3  . SYNTHROID 125 MCG tablet Take 125 mcg by mouth every morning.  5  . traZODone (DESYREL) 50 MG tablet Take 0.5 tablets (25 mg total) by mouth at bedtime as needed for sleep. 30 tablet 0  . triamcinolone (NASACORT ALLERGY 24HR) 55 MCG/ACT AERO nasal inhaler Place 2 sprays into the nose daily.     No current facility-administered medications for this visit.    Facility-Administered Medications Ordered in Other Visits  Medication Dose Route Frequency Provider Last Rate Last Dose  . bivalirudin (ANGIOMAX) 250 mg in sodium chloride 0.9 % 50 mL (5 mg/mL) infusion    Continuous PRN Kathleene Hazel, MD   Stopped at 09/20/15 [phone]  . bivalirudin (ANGIOMAX) BOLUS via infusion    PRN Kathleene Hazel, MD   54.45 mg at 09/20/15 9604  . fentaNYL (SUBLIMAZE) injection    PRN Kathleene Hazel, MD   25 mcg at 09/20/15 0651  . heparin 1,500 mL    PRN Kathleene Hazel, MD      . heparin infusion 2 units/mL in 0.9 % sodium chloride    Continuous PRN Kathleene Hazel, MD   1,500 mL at 09/20/15 0733  . iopamidol (ISOVUE-370) 76 % injection    PRN Kathleene Hazel, MD   165 mL at 09/20/15 0733  . lidocaine (PF) (XYLOCAINE) 1 % injection    PRN Kathleene Hazel, MD   2 mL at 09/20/15 5409  . midazolam (VERSED) injection    PRN Kathleene Hazel, MD   1 mg at 09/20/15 [phone]  . nitroGLYCERIN 1 mg/10 ml (100 mcg/ml) - IR/CATH LAB    PRN Kathleene Hazel, MD   200 mcg at 09/20/15 1478  . Radial Cocktail/Verapamil only    PRN Kathleene Hazel, MD   10 mL at 09/20/15 0629  . ticagrelor (BRILINTA) tablet    PRN Kathleene Hazel, MD   180 mg at 09/20/15 [phone]  . tirofiban (AGGRASTAT) bolus via infusion    PRN Kathleene Hazel, MD   1,815 mcg at 09/20/15 [phone]  . tirofiban (AGGRASTAT) infusion 50 mcg/mL 100 mL    Continuous PRN Kathleene Hazel, MD 13.1 mL/hr at 09/20/15 0651 0.15 mcg/kg/min at 09/20/15 8657    Allergies:   Paxil [paroxetine]; Spironolactone; Morphine and related; Percocet [oxycodone-acetaminophen]; Cefdinir; and Zolpidem   Social History:  The patient  reports that she has never smoked. She has never used smokeless tobacco. She reports that  she does not drink alcohol or use drugs.   Family History:  The patient's family history includes Arrhythmia in her mother; Heart attack in her father.    ROS:  Please see the history of present illness.   Otherwise, review of systems is positive for dizziness.   All other systems are reviewed and negative.    PHYSICAL EXAM: VS:  BP 90/66   Pulse 66   Ht 5\' 9"  (1.753 m)   Wt 142 lb 9.6 oz (64.7 kg)   BMI 21.06 kg/m  , BMI Body mass index is 21.06 kg/m. GEN: Well nourished, well developed, in no acute distress  HEENT: normal  Neck: no JVD, carotid bruits, or masses Cardiac: RRR; no murmurs, rubs, or gallops,no edema  Respiratory:  clear to auscultation bilaterally, normal work of breathing GI: soft, nontender, nondistended, + BS MS: no deformity or atrophy  Skin: warm and dry Neuro:  Strength and sensation are intact Psych: euthymic mood, full affect  EKG:  EKG is ordered today. Personal review of the ekg ordered shows sinus rhythm, rate 66, LAE, anterior infarct  Recent Labs: 11/23/2015: ALT 60; Magnesium 1.8; TSH 3.212 01/08/2016: Hemoglobin 10.4; Platelets 232 03/17/2016: B Natriuretic Peptide 685.3; BUN 18; Creatinine, Ser 1.25; Potassium 3.9;  Sodium 139    Lipid Panel     Component Value Date/Time   CHOL 93 (L) 10/24/2015 0809   TRIG 86 10/24/2015 0809   HDL 43 (L) 10/24/2015 0809   CHOLHDL 2.2 10/24/2015 0809   VLDL 17 10/24/2015 0809   LDLCALC 33 10/24/2015 0809     Wt Readings from Last 3 Encounters:  04/25/16 142 lb 9.6 oz (64.7 kg)  04/17/16 140 lb (63.5 kg)  03/17/16 141 lb 4 oz (64.1 kg)      Other studies Reviewed: Additional studies/ records that were reviewed today include: TTE 01/02/16  Review of the above records today demonstrates:  - Left ventricle: The cavity size was normal. Wall thickness was   normal. Akinesis of the mid to apical anterior wall, the true   apex, and the apical lateral wall. The mid to apical anteroseptal   and inferoseptal walls were severely hypokinetic. Systolic   function was moderately to severely reduced. The estimated   ejection fraction was in the range of 30% to 35%. Features are   consistent with a pseudonormal left ventricular filling pattern,   with concomitant abnormal relaxation and increased filling   pressure (grade 2 diastolic dysfunction). E/medial e&' > 15,   suggesting LV end diastolic pressure at least 20 mmHg. - Aortic valve: There was no stenosis. - Mitral valve: Mildly calcified annulus. Mildly calcified leaflets   . There was severe regurgitation. - Left atrium: The atrium was moderately dilated. - Right ventricle: The cavity size was normal. Systolic function   was normal. - Tricuspid valve: Peak RV-RA gradient (S): 59 mm Hg. - Pulmonary arteries: PA peak pressure: 62 mm Hg (S). - Inferior vena cava: The vessel was normal in size. The   respirophasic diameter changes were in the normal range (= 50%),   consistent with normal central venous pressure. - Pericardium, extracardiac: Small pleural effusion was noted.   ASSESSMENT AND PLAN:  1.  Coronary artery disease: Currently no chest pain. Continue current management.   2. Paroxysmal atrial  fibrillation: on Xarelto. Her GFR is greater than 50, Catherine Mays therefore increase her to the 20 mg dose.  3. Ischemic cardiomyopathy: LVEF remains low after therapy on OMT. Currently on Coreg, Cozaar,  lasix. ICD implanted 01/23/16. Device function is stable at this time. No changes were made other than changing to chronic settings.  Current medicines are reviewed at length with the patient today.   The patient does not have concerns regarding her medicines.  The following changes were made today:  Increase Xarelto to 20 mg  Labs/ tests ordered today include:  Orders Placed This Encounter  Procedures  . EKG 12-Lead     Disposition:   FU with Catherine Mays 3 months  Signed, Catherine Shone Jorja Loa, MD  04/25/2016 11:41 AM     [facility] [address] [place] [address] [phone] (office) [phone] (fax)

## 2016-04-25 NOTE — Patient Instructions (Addendum)
Medication Instructions:    Your physician recommends that you continue on your current medications as directed. Please refer to the Current Medication list given to you today.  --- If you need a refill on your cardiac medications before your next appointment, please call your pharmacy. ---  Labwork:  None ordered  Testing/Procedures:  None ordered  Follow-Up: Remote monitoring is used to monitor your Pacemaker of ICD from home. This monitoring reduces the number of office visits required to check your device to one time per year. It allows us to keep an eye on the functioning of your device to ensure it is working properly. You are scheduled for a device check from home on 07/28/2016. You may send your transmission at any time that day. If you have a wireless device, the transmission will be sent automatically. After your physician reviews your transmission, you will receive a postcard with your next transmission date.   Your physician wants you to follow-up in: 9 months with Dr. Camnitz.  You will receive a reminder letter in the mail two months in advance. If you don't receive a letter, please call our office to schedule the follow-up appointment.  Thank you for choosing [facility]!!   Winner Valeriano, RN [phone]   

## 2016-06-17 ENCOUNTER — Other Ambulatory Visit (HOSPITAL_COMMUNITY): Payer: Self-pay | Admitting: Adult Health

## 2016-06-19 ENCOUNTER — Ambulatory Visit (HOSPITAL_COMMUNITY)
Admission: RE | Admit: 2016-06-19 | Discharge: 2016-06-19 | Disposition: A | Discharge status: Home / Self Care | Payer: BLUE CROSS/BLUE SHIELD | Source: Ambulatory Visit | Attending: Cardiology | Admitting: Cardiology

## 2016-06-19 ENCOUNTER — Encounter (HOSPITAL_COMMUNITY): Payer: Self-pay

## 2016-06-19 VITALS — BP 98/68 | HR 69 | Wt 143.5 lb

## 2016-06-19 DIAGNOSIS — Z95810 Presence of automatic (implantable) cardiac defibrillator: Secondary | ICD-10-CM | POA: Diagnosis not present

## 2016-06-19 DIAGNOSIS — Z8249 Family history of ischemic heart disease and other diseases of the circulatory system: Secondary | ICD-10-CM | POA: Diagnosis not present

## 2016-06-19 DIAGNOSIS — I2510 Atherosclerotic heart disease of native coronary artery without angina pectoris: Secondary | ICD-10-CM | POA: Diagnosis not present

## 2016-06-19 DIAGNOSIS — Z7902 Long term (current) use of antithrombotics/antiplatelets: Secondary | ICD-10-CM | POA: Insufficient documentation

## 2016-06-19 DIAGNOSIS — E785 Hyperlipidemia, unspecified: Secondary | ICD-10-CM | POA: Insufficient documentation

## 2016-06-19 DIAGNOSIS — Z7901 Long term (current) use of anticoagulants: Secondary | ICD-10-CM | POA: Insufficient documentation

## 2016-06-19 DIAGNOSIS — E784 Other hyperlipidemia: Secondary | ICD-10-CM

## 2016-06-19 DIAGNOSIS — I340 Nonrheumatic mitral (valve) insufficiency: Secondary | ICD-10-CM | POA: Diagnosis not present

## 2016-06-19 DIAGNOSIS — I255 Ischemic cardiomyopathy: Secondary | ICD-10-CM | POA: Insufficient documentation

## 2016-06-19 DIAGNOSIS — Z79899 Other long term (current) drug therapy: Secondary | ICD-10-CM | POA: Diagnosis not present

## 2016-06-19 DIAGNOSIS — I480 Paroxysmal atrial fibrillation: Secondary | ICD-10-CM | POA: Diagnosis not present

## 2016-06-19 DIAGNOSIS — I252 Old myocardial infarction: Secondary | ICD-10-CM | POA: Diagnosis not present

## 2016-06-19 DIAGNOSIS — Z955 Presence of coronary angioplasty implant and graft: Secondary | ICD-10-CM | POA: Insufficient documentation

## 2016-06-19 DIAGNOSIS — E7849 Other hyperlipidemia: Secondary | ICD-10-CM

## 2016-06-19 DIAGNOSIS — I5022 Chronic systolic (congestive) heart failure: Secondary | ICD-10-CM | POA: Diagnosis not present

## 2016-06-19 DIAGNOSIS — M25512 Pain in left shoulder: Secondary | ICD-10-CM | POA: Insufficient documentation

## 2016-06-19 LAB — CBC
HCT: 35 % — ABNORMAL LOW (ref 36.0–46.0)
Hemoglobin: 10.9 g/dL — ABNORMAL LOW (ref 12.0–15.0)
MCH: 27.9 pg (ref 26.0–34.0)
MCHC: 31.1 g/dL (ref 30.0–36.0)
MCV: 89.7 fL (ref 78.0–100.0)
Platelets: 162 10*3/uL (ref 150–400)
RBC: 3.9 MIL/uL (ref 3.87–5.11)
RDW: 15.2 % (ref 11.5–15.5)
WBC: 7.4 10*3/uL (ref 4.0–10.5)

## 2016-06-19 LAB — BASIC METABOLIC PANEL
Anion gap: 7 (ref 5–15)
BUN: 20 mg/dL (ref 6–20)
CO2: 28 mmol/L (ref 22–32)
Calcium: 8.9 mg/dL (ref 8.9–10.3)
Chloride: 104 mmol/L (ref 101–111)
Creatinine, Ser: 1.11 mg/dL — ABNORMAL HIGH (ref 0.44–1.00)
GFR calc Af Amer: >60 mL/min (ref >60)
GFR calc non Af Amer: 57 mL/min — ABNORMAL LOW (ref >60)
Glucose, Bld: 151 mg/dL — ABNORMAL HIGH (ref 65–99)
Potassium: 4 mmol/L (ref 3.5–5.1)
Sodium: 139 mmol/L (ref 135–145)

## 2016-06-19 LAB — LIPID PANEL
Cholesterol: 116 mg/dL (ref 0–200)
HDL: 57 mg/dL (ref >40)
LDL Cholesterol: 42 mg/dL (ref 0–99)
Total CHOL/HDL Ratio: 2 RATIO
Triglycerides: 86 mg/dL (ref <150)
VLDL: 17 mg/dL (ref 0–40)

## 2016-06-19 NOTE — Progress Notes (Signed)
PCP: Dr. Margo Aye Cardiology: Dr. Clifton James HF Cardiology: Dr. Shirlee Latch  51 yo with history of CAD s/p anterior STEMI with DES to LAD, ischemic cardiomyopathy, mitral regurgitation, and paroxysmal atrial fibrillation presents for CHF clinic evaluation.  She was admitted in 7/17 with anterior STEMI.  She had a late presentation to the cath lab.  Most recent echo in 10/17 showed EF about 25% with LAD-territory wall motion abnormalities.  She had peri-MI atrial fibrillation and was started on Xarelto.  She was put on amiodarone.   After discharge, she continued to feel poorly.  She developed nausea, anorexia, and significant fatigue.  She was admitted for evaluation in 10/17 given concern for low output heart failure.  However, stopping amiodarone essentially resolved her symptoms.  She had RHC showing preserved cardiac output but the PCWP was high due to prominent v-waves, presumably from significant mitral regurgitation.  Of note, she was in atrial fibrillation for a time during this admission.   She had St Jude ICD placed.  She is back at work for the [employer].    She is doing well symptomatically. BP still runs low, usually in the 90s-100s systolic but sometimes in 80s. She does not feel lightheaded.  No falls.  No chest pain.  No orthopnea/PND.  She has finished cardiac rehab.  No significant exertional dyspnea.  She has had left shoulder pain since her initial hospitalization, notes it with abduction and flexion of the arm at the shoulder.  It is getting more bothersome.  No BRBPR or melena.   Labs (10/17): K 3.9, creatinine 1.24, hgb 10.3 Labs (11/17): K 4, creatinine 1.27 Labs (12/17): K 4.2, creatinine 1.12, BNP 439 Labs (2/18): K 4.7, creatinine 1.37  ECG (personally reviewed): NSR, LAFB, poor RWP  PMH:  1. CAD: Anterior STEMI in 7/17 with late presentation to the cath lab. She had DES to proximal LAD.  No significant disease in other vessels.  2. Chronic systolic CHF: Ischemic  cardiomyopathy.  - Echo 10/17 with EF 25%, akinesis of the mid-apical anteroseptal and anterior walls, apical dyskinesis, moderate to severe MR with incomplete coaptation.  - RHC (10/17): mean RA 4, PA 59/17 mean 38, mean PCWP 27 with prominent V waves suggesting significant MR, CI 3.06, PVR 2 WU.  - ACEI cough.  - Echo (11/17): EF 30-35%, normal RV size and systolic function, severe MR with restriction of posterior leaflet.   3. Atrial fibrillation: Paroxysmal.  Noted 7/17 admission peri-MI, also noted again 10/17 admission. She did not tolerate amiodarone due to nausea/anorexia.  4. Mitral regurgitation: Moderate to severe on 10/17 echo, incomplete coaptation.  ?Etiology, her MI (anterior) does not typically lead to ischemic MR.   - Echo in 11/17 with severe MR, posterior leaflet restricted.  5. Hyperlipidemia  SH: Married, lives in [place], works for the [employer], no smoking or ETOH.   Family History  Problem Relation Age of Onset  . Heart attack Father   . Arrhythmia Mother    ROS: All systems reviewed and negative except as per HPI.   Current Outpatient Prescriptions  Medication Sig Dispense Refill  . atorvastatin (LIPITOR) 80 MG tablet Take 1 tablet (80 mg total) by mouth daily at 6 PM. 30 tablet 11  . Calcium Carb-Cholecalciferol (CALCIUM + D3 PO) Take 1 tablet by mouth every morning.    . carvedilol (COREG) 3.125 MG tablet Take 1 tablet (3.125 mg total) by mouth 2 (two) times daily with a meal. 60 tablet 6  . furosemide (LASIX)  20 MG tablet Take 2 tablets (40 mg total) by mouth daily. 30 tablet 4  . pantoprazole (PROTONIX) 40 MG tablet Take 1 tablet (40 mg total) by mouth daily. 30 tablet 11  . potassium chloride SA (K-DUR,KLOR-CON) 20 MEQ tablet Take 3 tablets (60 mEq total) by mouth daily. 90 tablet 6  . Rivaroxaban (XARELTO) 15 MG TABS tablet Take 1 tablet (15 mg total) by mouth daily with supper. 30 tablet 6  . SYNTHROID 125 MCG tablet Take 125 mcg by mouth every morning.  5  .  ticagrelor (BRILINTA) 90 MG TABS tablet Take 1 tablet (90 mg total) by mouth 2 (two) times daily. 180 tablet 3  . traZODone (DESYREL) 50 MG tablet Take 0.5 tablets (25 mg total) by mouth at bedtime as needed for sleep. 30 tablet 0  . triamcinolone (NASACORT ALLERGY 24HR) 55 MCG/ACT AERO nasal inhaler Place 2 sprays into the nose daily.    Marland Kitchen losartan (COZAAR) 25 MG tablet Take 0.5 tablets (12.5 mg total) by mouth at bedtime. 15 tablet 3  . nitroGLYCERIN (NITROSTAT) 0.4 MG SL tablet Place 1 tablet (0.4 mg total) under the tongue every 5 (five) minutes as needed for chest pain. (Patient not taking: Reported on 12/11/2015) 25 tablet 3    BP 98/68   Pulse 69   Wt 143 lb 8 oz (65.1 kg)   SpO2 100%   BMI 21.19 kg/m  General: NAD Neck: JVP 7 cm, no thyromegaly or thyroid nodule.  Lungs: CTAB CV: Nondisplaced PMI.  Heart regular S1/S2, no S3/S4, 1/6 HSM at apex.  No peripheral edema.  No carotid bruit.  Normal pedal pulses.  Abdomen: Soft, nontender, no hepatosplenomegaly, no distention.  Skin: Intact without lesions or rashes.  Neurologic: Alert and oriented x 3.  Psych: Normal affect. Extremities: No clubbing or cyanosis.  HEENT: Normal.   Assessment/Plan: 1. CAD: No ischemic-type chest pain.  S/p anterior MI in 7/17 with DES to RCA.   - Continue Xarelto 15 mg daily and Plavix 75 daily => continue Plavix to finish out a year post-PCI, then stop Plavix in 7/18 and increase Xarelto to 20 mg daily.  No aspirin given Xarelto use.  - Continue statin.  2. Chronic systolic CHF: Ischemic cardiomyopathy.  EF 25% on echo in 10/17, 30-35% on echo in 11/17. She has extensive LAD-territory scar. 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  She is not volume overloaded on exam. NYHA class II symptoms.  BP still runs low. No room for titration of her meds.  - Continue Coreg 3.125 mg bid and spironolactone.    - Continue losartan 25 mg qhs.  BMET today.   - Continue Lasix 40 mg daily.    3. Mitral regurgitation: Moderate to  severe on 10/17 echo, severe on 11/17 echo.  The posterior leaflet appeared restricted.  I do not think that this is ischemic MR.  Patient tells me that she has been told that she has had a murmur for years but never had an echo prior, I am concerned that she has had pre-existing primary mitral regurgitation that may end up needing to be repaired. Her murmur has been less prominent the last couple of appointments.  Will continue to monitor.  She will be a year out from MI in 7/18. At that time, I will get a TEE to reassess LV function and also to more closely evaluate the MV.    4. Atrial fibrillation: Unable to tolerate amiodarone due to GI side effects.  She  is in NSR today on Xarelto 15 mg daily. She has felt like she went back into atrial fibrillation a couple of times briefly, but nothing was noted on device interrogation.  If atrial fibrillation becomes a problem in the future, she would be a Tikosyn or ablation candidate.  When she goes off Plavix in 7/18, will increase Xarelto to 20 mg daily.  5. Shoulder pain: Left.  Suspect she may have a problem with her rotator cuff.  Will refer her to ortho for evaluation (Dr. Ave Filter).   Followup in 3 months, will set up TEE at that time.   Marca Ancona 06/19/2016

## 2016-06-19 NOTE — Patient Instructions (Signed)
Labs today  You have been referred to Dr Ave Filter at [facility]   Your physician recommends that you schedule a follow-up appointment in: 3 months

## 2016-07-09 ENCOUNTER — Encounter (HOSPITAL_COMMUNITY): Payer: BLUE CROSS/BLUE SHIELD | Admitting: Internal Medicine

## 2016-07-28 ENCOUNTER — Ambulatory Visit (INDEPENDENT_AMBULATORY_CARE_PROVIDER_SITE_OTHER): Payer: BLUE CROSS/BLUE SHIELD | Admitting: *Deleted

## 2016-07-28 DIAGNOSIS — I255 Ischemic cardiomyopathy: Secondary | ICD-10-CM | POA: Diagnosis not present

## 2016-07-28 NOTE — Progress Notes (Signed)
Remote ICD transmission.   

## 2016-07-30 LAB — CUP PACEART REMOTE DEVICE CHECK
Battery Remaining Longevity: 94 mo
Battery Remaining Percentage: 92 %
Battery Voltage: 3.19 V
Brady Statistic RV Percent Paced: 1 %
Date Time Interrogation Session: 20180604072440
HighPow Impedance: 64 Ohm
HighPow Impedance: 64 Ohm
Implantable Lead Implant Date: 20171129
Implantable Lead Location: 753860
Implantable Pulse Generator Implant Date: 20171129
Lead Channel Impedance Value: 390 Ohm
Lead Channel Pacing Threshold Amplitude: 0.5 V
Lead Channel Pacing Threshold Pulse Width: 0.5 ms
Lead Channel Sensing Intrinsic Amplitude: 12 mV
Lead Channel Setting Pacing Amplitude: 2.5 V
Lead Channel Setting Pacing Pulse Width: 0.5 ms
Lead Channel Setting Sensing Sensitivity: 0.5 mV
Pulse Gen Serial Number: 7377983

## 2016-08-05 ENCOUNTER — Encounter: Payer: Self-pay | Admitting: Cardiology

## 2016-08-13 ENCOUNTER — Encounter: Payer: Self-pay | Admitting: Cardiovascular Disease

## 2016-08-13 MED ORDER — CARVEDILOL 6.25 MG PO TABS: 6.2500 mg | ORAL_TABLET | Freq: Two times a day (BID) | ORAL | 3 refills | Status: DC

## 2016-08-13 NOTE — Telephone Encounter (Signed)
-----   Message from Will Martin Camnitz, MD sent at 07/30/2016  2:58 PM EDT ----- Abnormal device interrogation reviewed.  Lead parameters and battery status stable.  VT noted on interrogation, increase coreg to 6.25 mg BID. 

## 2016-08-13 NOTE — Telephone Encounter (Deleted)
-----   Message from Will Jorja Loa, MD sent at 07/30/2016  2:58 PM EDT ----- Abnormal device interrogation reviewed.  Lead parameters and battery status stable.  VT noted on interrogation, increase coreg to 6.25 mg BID.

## 2016-08-13 NOTE — Telephone Encounter (Signed)
Informed patient of Dr.Camnitz's recommendation to increase Coreg. I explained to her that she she should monitor her Bp with this increase and notify the office if she notices any excessive dizziness or any other associated sx's. Patient verbalized understanding.  Rx sent to pharmacy.

## 2016-08-21 ENCOUNTER — Encounter: Payer: Self-pay | Admitting: Cardiology

## 2016-08-28 ENCOUNTER — Telehealth (HOSPITAL_COMMUNITY): Payer: Self-pay | Admitting: *Deleted

## 2016-08-28 ENCOUNTER — Other Ambulatory Visit (HOSPITAL_COMMUNITY): Payer: Self-pay | Admitting: *Deleted

## 2016-08-28 DIAGNOSIS — I340 Nonrheumatic mitral (valve) insufficiency: Secondary | ICD-10-CM

## 2016-08-28 NOTE — Telephone Encounter (Signed)
Pt called and requested to go ahead and sch TEE for her work sch rather than waiting for her appt on 7/30.  She request to have it done 8/15.  TEE for MR sch for 8/15 at 10 am w/Dr Shirlee Latch, pt is aware of day, will give instructions when she comes to appt 7/30

## 2016-09-01 ENCOUNTER — Ambulatory Visit (INDEPENDENT_AMBULATORY_CARE_PROVIDER_SITE_OTHER): Payer: BLUE CROSS/BLUE SHIELD | Admitting: Cardiovascular Disease

## 2016-09-01 ENCOUNTER — Encounter: Payer: Self-pay | Admitting: Cardiovascular Disease

## 2016-09-01 VITALS — BP 90/60 | HR 63 | Ht 69.0 in | Wt 143.0 lb

## 2016-09-01 DIAGNOSIS — I5022 Chronic systolic (congestive) heart failure: Secondary | ICD-10-CM | POA: Diagnosis not present

## 2016-09-01 DIAGNOSIS — I255 Ischemic cardiomyopathy: Secondary | ICD-10-CM | POA: Diagnosis not present

## 2016-09-01 DIAGNOSIS — I2510 Atherosclerotic heart disease of native coronary artery without angina pectoris: Secondary | ICD-10-CM | POA: Diagnosis not present

## 2016-09-01 DIAGNOSIS — I480 Paroxysmal atrial fibrillation: Secondary | ICD-10-CM

## 2016-09-01 MED ORDER — ASPIRIN EC 81 MG PO TBEC: 81.0000 mg | DELAYED_RELEASE_TABLET | Freq: Every day | ORAL | Status: DC

## 2016-09-01 MED ORDER — RIVAROXABAN 20 MG PO TABS: 20.0000 mg | ORAL_TABLET | Freq: Every day | ORAL | 3 refills | Status: DC

## 2016-09-01 NOTE — Patient Instructions (Signed)
Medication Instructions:  1. FINISH YOUR CURRENT RX OF PLAVIX; ONCE YOU FINISH THE PLAVIX SEE # 2 2. START ASPIRIN 81 MG DAILY 3. INCREASE XARELTO 20 MG DAILY NEW RX HAS BEEN SENT IN  Labwork: NONE ORDRED  Testing/Procedures: NONE ORDERED  Follow-Up: Your physician wants you to follow-up in: 1 YEAR WITH DR. Clifton James You will receive a reminder letter in the mail two months in advance. If you don't receive a letter, please call our office to schedule the follow-up appointment.   Any Other Special Instructions Will Be Listed Below (If Applicable).     If you need a refill on your cardiac medications before your next appointment, please call your pharmacy.

## 2016-09-01 NOTE — Progress Notes (Signed)
Chief Complaint  Patient presents with  . Follow-up    History of Present Illness: 51 yo female with history of CAD with anterior STEMI in July 2017, ischemic cardiomyopathy, chronic systolic CHF, paroxysmal atrial fib, severe mitral regurgitation here today for follow up. She presented to the [facility] ED 09/19/15 with chest pain and workup was delayed leading to late recognition of her MI. Cardiac cath 08/2715 with occluded mid LAD treated with a single drug eluting stent. She was found to have severe LV systolic dysfunction with LVEF around 30% immediately post cath. Continued chest pain and relook cath 09/21/15 demonstrated patency of the LAD stent. Her hospitalization was complicated by atrial fibrillation. She has been on Xarelto. She did not tolerate amiodarone. Echo November 2017 with LVEF 30-35%, severe MR. ICD implanted November 2017. She has been followed in the advanced CHF clinic.  She is here today for follow up. The patient denies any chest pain, dyspnea, palpitations, lower extremity edema, orthopnea, PND, dizziness, near syncope or syncope. She feels great.    Primary Care Physician: Catherine Stabile, MD   Past Medical History:  Diagnosis Date  . CAD in native artery   . Chronic systolic CHF (congestive heart failure) (HCC)   . Hypotension    a. preventing med titration for HF.  Marland Kitchen Hypothyroidism 09/24/2015  . Ischemic cardiomyopathy   . PAF (paroxysmal atrial fibrillation) (HCC) 09/24/2015   a. dx at time of STEMI 08/2015.  . Pre-diabetes   . PVC's (premature ventricular contractions)     Past Surgical History:  Procedure Laterality Date  . CARDIAC CATHETERIZATION N/A 09/20/2015   Procedure: Left Heart Cath and Coronary Angiography;  Surgeon: Kathleene Hazel, MD;  Location: [facility] INVASIVE CV LAB;  Service: Cardiovascular;  Laterality: N/A;  . CARDIAC CATHETERIZATION N/A 09/20/2015   Procedure: Coronary Stent Intervention;  Surgeon: Kathleene Hazel, MD;   Location: [facility] INVASIVE CV LAB;  Service: Cardiovascular;  Laterality: N/A;  . CARDIAC CATHETERIZATION N/A 09/21/2015   Procedure: Left Heart Cath and Coronary Angiography;  Surgeon: Tonny Bollman, MD;  Location: [facility] INVASIVE CV LAB;  Service: Cardiovascular;  Laterality: N/A;  . CARDIAC CATHETERIZATION N/A 11/26/2015   Procedure: Right Heart Cath;  Surgeon: [name], MD;  Location: [facility] INVASIVE CV LAB;  Service: Cardiovascular;  Laterality: N/A;  . CORONARY STENT PLACEMENT  09/20/2015  . EP IMPLANTABLE DEVICE N/A 01/23/2016   Procedure: ICD Implant;  Surgeon: Will Jorja Loa, MD;  Location: [facility] INVASIVE CV LAB;  Service: Cardiovascular;  Laterality: N/A;  . THYROIDECTOMY      Current Outpatient Prescriptions  Medication Sig Dispense Refill  . atorvastatin (LIPITOR) 80 MG tablet Take 1 tablet (80 mg total) by mouth daily at 6 PM. 30 tablet 11  . Calcium Carb-Cholecalciferol (CALCIUM + D3 PO) Take 1 tablet by mouth every morning.    . carvedilol (COREG) 6.25 MG tablet Take 1 tablet (6.25 mg total) by mouth 2 (two) times daily. 60 tablet 3  . fexofenadine (ALLEGRA) 180 MG tablet Take 180 mg by mouth daily.    . furosemide (LASIX) 20 MG tablet Take 2 tablets (40 mg total) by mouth daily. 30 tablet 3  . losartan (COZAAR) 25 MG tablet Take 1 tablet (25 mg total) by mouth at bedtime. 90 tablet 3  . nitroGLYCERIN (NITROSTAT) 0.4 MG SL tablet Place 1 tablet (0.4 mg total) under the tongue every 5 (five) minutes as needed for chest pain. 25 tablet 3  . pantoprazole (PROTONIX) 40  MG tablet Take 1 tablet (40 mg total) by mouth daily. 30 tablet 11  . potassium chloride SA (K-DUR,KLOR-CON) 20 MEQ tablet Take 3 tablets (60 mEq total) by mouth daily. 90 tablet 6  . spironolactone (ALDACTONE) 25 MG tablet Take 0.5 tablets (12.5 mg total) by mouth daily. 45 tablet 3  . SYNTHROID 125 MCG tablet Take 125 mcg by mouth every morning.  5  . traZODone (DESYREL) 50 MG tablet Take 0.5 tablets (25 mg total) by  mouth at bedtime as needed for sleep. 30 tablet 0  . triamcinolone (NASACORT ALLERGY 24HR) 55 MCG/ACT AERO nasal inhaler Place 2 sprays into the nose daily.    Marland Kitchen aspirin EC 81 MG tablet Take 1 tablet (81 mg total) by mouth daily.    . rivaroxaban (XARELTO) 20 MG TABS tablet Take 1 tablet (20 mg total) by mouth daily with supper. 90 tablet 3   No current facility-administered medications for this visit.    Facility-Administered Medications Ordered in Other Visits  Medication Dose Route Frequency Provider Last Rate Last Dose  . bivalirudin (ANGIOMAX) 250 mg in sodium chloride 0.9 % 50 mL (5 mg/mL) infusion    Continuous PRN Kathleene Hazel, MD   Stopped at 09/20/15 [phone]  . bivalirudin (ANGIOMAX) BOLUS via infusion    PRN Kathleene Hazel, MD   54.45 mg at 09/20/15 [phone]  . fentaNYL (SUBLIMAZE) injection    PRN Kathleene Hazel, MD   25 mcg at 09/20/15 0651  . heparin 1,500 mL    PRN Verne Carrow D, MD      . heparin infusion 2 units/mL in 0.9 % sodium chloride    Continuous PRN Kathleene Hazel, MD   1,500 mL at 09/20/15 0733  . iopamidol (ISOVUE-370) 76 % injection    PRN Kathleene Hazel, MD   165 mL at 09/20/15 0733  . lidocaine (PF) (XYLOCAINE) 1 % injection    PRN Kathleene Hazel, MD   2 mL at 09/20/15 5409  . midazolam (VERSED) injection    PRN Kathleene Hazel, MD   1 mg at 09/20/15 0651  . nitroGLYCERIN 1 mg/10 ml (100 mcg/ml) - IR/CATH LAB    PRN Kathleene Hazel, MD   200 mcg at 09/20/15 8119  . Radial Cocktail/Verapamil only    PRN Kathleene Hazel, MD   10 mL at 09/20/15 0629  . ticagrelor (BRILINTA) tablet    PRN Kathleene Hazel, MD   180 mg at 09/20/15 [phone]  . tirofiban (AGGRASTAT) bolus via infusion    PRN Kathleene Hazel, MD   1,815 mcg at 09/20/15 [phone]  . tirofiban (AGGRASTAT) infusion 50 mcg/mL 100 mL    Continuous PRN Kathleene Hazel, MD 13.1 mL/hr at 09/20/15 0651 0.15 mcg/kg/min at  09/20/15 0651    Allergies  Allergen Reactions  . Paxil [Paroxetine] Palpitations  . Spironolactone Other (See Comments)    Kidney problems  . Morphine And Related Nausea And Vomiting  . Percocet [Oxycodone-Acetaminophen] Nausea And Vomiting  . Cefdinir Nausea Only  . Zolpidem Other (See Comments)    Doesn't work for patient at all    Social History   Social History  . Marital status: Married    Spouse name: N/A  . Number of children: N/A  . Years of education: N/A   Occupational History  . Not on file.   Social History Main Topics  . Smoking status: Never Smoker  . Smokeless tobacco: Never Used  .  Alcohol use No  . Drug use: No  . Sexual activity: Not on file   Other Topics Concern  . Not on file   Social History Narrative  . No narrative on file    Family History  Problem Relation Age of Onset  . Heart attack Father   . Arrhythmia Mother     Review of Systems:  As stated in the HPI and otherwise negative.   BP 90/60   Pulse 63   Ht 5\' 9"  (1.753 m)   Wt 143 lb (64.9 kg)   SpO2 98%   BMI 21.12 kg/m   Physical Examination:  General: Well developed, well nourished, NAD  HEENT: OP clear, mucus membranes moist  SKIN: warm, dry. No rashes. Neuro: No focal deficits  Musculoskeletal: Muscle strength 5/5 all ext  Psychiatric: Mood and affect normal  Neck: No JVD, no carotid bruits, no thyromegaly, no lymphadenopathy.  Lungs:Clear bilaterally, no wheezes, rhonci, crackles Cardiovascular: Regular rate and rhythm. Systolic murmur noted.  Abdomen:Soft. Bowel sounds present. Non-tender.  Extremities: No lower extremity edema. Pulses are 2 + in the bilateral DP/PT.  Echo 01/02/16 Left ventricle: The cavity size was normal. Wall thickness was   normal. Akinesis of the mid to apical anterior wall, the true   apex, and the apical lateral wall. The mid to apical anteroseptal   and inferoseptal walls were severely hypokinetic. Systolic   function was moderately  to severely reduced. The estimated   ejection fraction was in the range of 30% to 35%. Features are   consistent with a pseudonormal left ventricular filling pattern,   with concomitant abnormal relaxation and increased filling   pressure (grade 2 diastolic dysfunction). E/medial e&' > 15,   suggesting LV end diastolic pressure at least 20 mmHg. - Aortic valve: There was no stenosis. - Mitral valve: Mildly calcified annulus. Mildly calcified leaflets   . There was severe regurgitation. - Left atrium: The atrium was moderately dilated. - Right ventricle: The cavity size was normal. Systolic function   was normal. - Tricuspid valve: Peak RV-RA gradient (S): 59 mm Hg. - Pulmonary arteries: PA peak pressure: 62 mm Hg (S). - Inferior vena cava: The vessel was normal in size. The   respirophasic diameter changes were in the normal range (= 50%),   consistent with normal central venous pressure. - Pericardium, extracardiac: Small pleural effusion was noted.  Impressions:  - Normal LV size with wall motion abnormalities as noted above. EF   30-35%. Moderate diastolic dysfunction. Normal RV size and   systolic function. There was severe mitral regurgitation.   Mechanism difficult to determine, likely restriction of the   posterior leaflet. Moderate pulmonary hypertension.  EKG:  EKG is not ordered today. The ekg ordered today demonstrates   Recent Labs: 11/23/2015: ALT 60; Magnesium 1.8; TSH 3.212 03/17/2016: B Natriuretic Peptide 685.3 06/19/2016: BUN 20; Creatinine, Ser 1.11; Hemoglobin 10.9; Platelets 162; Potassium 4.0; Sodium 139   Lipid Panel    Component Value Date/Time   CHOL 116 06/19/2016 0949   TRIG 86 06/19/2016 0949   HDL 57 06/19/2016 0949   CHOLHDL 2.0 06/19/2016 0949   VLDL 17 06/19/2016 0949   LDLCALC 42 06/19/2016 0949     Wt Readings from Last 3 Encounters:  09/01/16 143 lb (64.9 kg)  06/19/16 143 lb 8 oz (65.1 kg)  04/25/16 142 lb 9.6 oz (64.7 kg)      Assessment and Plan:    1. CAD without angina: No chest pain suggestive  of ischemia. Anterior MI in July 2017 with placement of a single DES in the mid LAD. She is on Plavix along with Xarelto (for atrial fib). Will stop Plavix at the end of July and will then use ASA 81 mg daily along with Xarelto. At that time, will increase Xarelto to 20 mg daily Continue statin and beta blocker.    2. PAF: She is in sinus today. Will increase Xarelto to 20 mg daily at the end of July. For now, continue beta blocker. She did not tolerate amiodarone.    3. Chronic systolic CHF/Ischemic cardiomyopathy: Volume status is ok today on daily Lasix.Marland Kitchen LVEF 30-35% by echo November 2017. ICD in place. Continue beta blocker, ARB and aldactone.    4. Mitral regurgitation: Dr. Shirlee Latch is planning a TEE next month to further assess. Her  Murmur is there but not loud.   Signed, Verne Carrow, MD 09/01/2016 9:39 AM    [facility] [address] [place], [place], [place]  [address] Phone: [phone]; Fax: [phone]

## 2016-09-18 ENCOUNTER — Other Ambulatory Visit: Payer: Self-pay | Admitting: Cardiovascular Disease

## 2016-09-18 MED ORDER — PANTOPRAZOLE SODIUM 40 MG PO TBEC: 40.0000 mg | DELAYED_RELEASE_TABLET | Freq: Every day | ORAL | 3 refills | Status: DC

## 2016-09-18 MED ORDER — ATORVASTATIN CALCIUM 80 MG PO TABS: 80.0000 mg | ORAL_TABLET | Freq: Every day | ORAL | 3 refills | Status: DC

## 2016-09-18 NOTE — Telephone Encounter (Signed)
Pt's medication was sent to pt's pharmacy as requested. Confirmation received.  °

## 2016-09-22 ENCOUNTER — Encounter (HOSPITAL_COMMUNITY): Payer: Self-pay | Admitting: Cardiology

## 2016-09-22 ENCOUNTER — Ambulatory Visit (HOSPITAL_COMMUNITY)
Admission: RE | Admit: 2016-09-22 | Discharge: 2016-09-22 | Disposition: A | Discharge status: Home / Self Care | Payer: BLUE CROSS/BLUE SHIELD | Source: Ambulatory Visit | Attending: Internal Medicine | Admitting: Internal Medicine

## 2016-09-22 VITALS — BP 94/60 | HR 61 | Wt 142.0 lb

## 2016-09-22 DIAGNOSIS — I252 Old myocardial infarction: Secondary | ICD-10-CM | POA: Diagnosis not present

## 2016-09-22 DIAGNOSIS — E784 Other hyperlipidemia: Secondary | ICD-10-CM | POA: Diagnosis not present

## 2016-09-22 DIAGNOSIS — Z7982 Long term (current) use of aspirin: Secondary | ICD-10-CM | POA: Diagnosis not present

## 2016-09-22 DIAGNOSIS — I480 Paroxysmal atrial fibrillation: Secondary | ICD-10-CM

## 2016-09-22 DIAGNOSIS — I255 Ischemic cardiomyopathy: Secondary | ICD-10-CM

## 2016-09-22 DIAGNOSIS — Z79899 Other long term (current) drug therapy: Secondary | ICD-10-CM | POA: Insufficient documentation

## 2016-09-22 DIAGNOSIS — Z7901 Long term (current) use of anticoagulants: Secondary | ICD-10-CM | POA: Insufficient documentation

## 2016-09-22 DIAGNOSIS — I2510 Atherosclerotic heart disease of native coronary artery without angina pectoris: Secondary | ICD-10-CM | POA: Insufficient documentation

## 2016-09-22 DIAGNOSIS — Z8249 Family history of ischemic heart disease and other diseases of the circulatory system: Secondary | ICD-10-CM | POA: Diagnosis not present

## 2016-09-22 DIAGNOSIS — I340 Nonrheumatic mitral (valve) insufficiency: Secondary | ICD-10-CM | POA: Diagnosis not present

## 2016-09-22 DIAGNOSIS — Z7951 Long term (current) use of inhaled steroids: Secondary | ICD-10-CM | POA: Insufficient documentation

## 2016-09-22 DIAGNOSIS — Z955 Presence of coronary angioplasty implant and graft: Secondary | ICD-10-CM | POA: Insufficient documentation

## 2016-09-22 DIAGNOSIS — E785 Hyperlipidemia, unspecified: Secondary | ICD-10-CM | POA: Diagnosis not present

## 2016-09-22 DIAGNOSIS — I2102 ST elevation (STEMI) myocardial infarction involving left anterior descending coronary artery: Secondary | ICD-10-CM

## 2016-09-22 DIAGNOSIS — I5022 Chronic systolic (congestive) heart failure: Secondary | ICD-10-CM | POA: Diagnosis not present

## 2016-09-22 DIAGNOSIS — E7849 Other hyperlipidemia: Secondary | ICD-10-CM

## 2016-09-22 DIAGNOSIS — Z95810 Presence of automatic (implantable) cardiac defibrillator: Secondary | ICD-10-CM | POA: Diagnosis not present

## 2016-09-22 LAB — BASIC METABOLIC PANEL
Anion gap: 8 (ref 5–15)
BUN: 16 mg/dL (ref 6–20)
CO2: 29 mmol/L (ref 22–32)
Calcium: 9.3 mg/dL (ref 8.9–10.3)
Chloride: 102 mmol/L (ref 101–111)
Creatinine, Ser: 1.14 mg/dL — ABNORMAL HIGH (ref 0.44–1.00)
GFR calc Af Amer: >60 mL/min (ref >60)
GFR calc non Af Amer: 55 mL/min — ABNORMAL LOW (ref >60)
Glucose, Bld: 95 mg/dL (ref 65–99)
Potassium: 3.9 mmol/L (ref 3.5–5.1)
Sodium: 139 mmol/L (ref 135–145)

## 2016-09-22 LAB — CBC
HCT: 35.6 % — ABNORMAL LOW (ref 36.0–46.0)
Hemoglobin: 11.4 g/dL — ABNORMAL LOW (ref 12.0–15.0)
MCH: 29.2 pg (ref 26.0–34.0)
MCHC: 32 g/dL (ref 30.0–36.0)
MCV: 91 fL (ref 78.0–100.0)
Platelets: 143 10*3/uL — ABNORMAL LOW (ref 150–400)
RBC: 3.91 MIL/uL (ref 3.87–5.11)
RDW: 13.6 % (ref 11.5–15.5)
WBC: 5.9 10*3/uL (ref 4.0–10.5)

## 2016-09-22 NOTE — Progress Notes (Signed)
PCP: Dr. Margo Aye Cardiology: Dr. Clifton James HF Cardiology: Dr. Shirlee Latch  51 yo with history of CAD s/p anterior STEMI with DES to LAD, ischemic cardiomyopathy, mitral regurgitation, and paroxysmal atrial fibrillation presents for CHF clinic evaluation.  She was admitted in 7/17 with anterior STEMI.  She had a late presentation to the cath lab.  Most recent echo in 10/17 showed EF about 25% with LAD-territory wall motion abnormalities.  She had peri-MI atrial fibrillation and was started on Xarelto.  She was put on amiodarone.   After discharge, she continued to feel poorly.  She developed nausea, anorexia, and significant fatigue.  She was admitted for evaluation in 10/17 given concern for low output heart failure.  However, stopping amiodarone essentially resolved her symptoms. She had RHC showing preserved cardiac output but the PCWP was high due to prominent v-waves, presumably from significant mitral regurgitation.  Of note, she was in atrial fibrillation for a time during this admission.   She returns today for HF follow up. Overall feeling well, back to baseline activity wise. She is working daily and has plenty of energy. No SOB with walking into clinic, she can exercise without SOB. She feels dizzy at times when she bends over and stands up after doing some light gardening. She denies dizziness when she goes from sitting to standing. She is taking all of her medications with compliance. Denies chest pain. Weights are stable at home. She has a TEE planned for Aug 15th to assess her mitral regurgitation.   Labs (10/17): K 3.9, creatinine 1.24, hgb 10.3 Labs (11/17): K 4, creatinine 1.27 Labs (12/17): K 4.2, creatinine 1.12, BNP 439 Labs (2/18): K 4.7, creatinine 1.37 Labs (7/18): K 3.9, creatinine 1.14  ECG (personally reviewed): NSR, LAFB, poor RWP  PMH:  1. CAD: Anterior STEMI in 7/17 with late presentation to the cath lab. She had DES to proximal LAD.  No significant disease in other vessels.   2. Chronic systolic CHF: Ischemic cardiomyopathy.  - Echo 10/17 with EF 25%, akinesis of the mid-apical anteroseptal and anterior walls, apical dyskinesis, moderate to severe MR with incomplete coaptation.  - RHC (10/17): mean RA 4, PA 59/17 mean 38, mean PCWP 27 with prominent V waves suggesting significant MR, CI 3.06, PVR 2 WU.  - ACEI cough.  - Echo (11/17): EF 30-35%, normal RV size and systolic function, severe MR with restriction of posterior leaflet.   3. Atrial fibrillation: Paroxysmal.  Noted 7/17 admission peri-MI, also noted again 10/17 admission. She did not tolerate amiodarone due to nausea/anorexia.  4. Mitral regurgitation: Moderate to severe on 10/17 echo, incomplete coaptation.  ?Etiology, her MI (anterior) does not typically lead to ischemic MR.   - Echo in 11/17 with severe MR, posterior leaflet restricted.  5. Hyperlipidemia  SH: Married, lives in [place], works for the [employer], no smoking or ETOH.   Family History  Problem Relation Age of Onset  . Heart attack Father   . Arrhythmia Mother    ROS: All systems reviewed and negative except as per HPI.    Current Meds  Medication Sig  . aspirin EC 81 MG tablet Take 1 tablet (81 mg total) by mouth daily.  Marland Kitchen atorvastatin (LIPITOR) 80 MG tablet Take 1 tablet (80 mg total) by mouth daily at 6 PM.  . Calcium Carb-Cholecalciferol (CALCIUM + D3 PO) Take 1 tablet by mouth every morning.  . fexofenadine (ALLEGRA) 180 MG tablet Take 180 mg by mouth daily.  . furosemide (LASIX) 20 MG tablet Take  2 tablets (40 mg total) by mouth daily.  Marland Kitchen losartan (COZAAR) 25 MG tablet Take 1 tablet (25 mg total) by mouth at bedtime.  . nitroGLYCERIN (NITROSTAT) 0.4 MG SL tablet Place 1 tablet (0.4 mg total) under the tongue every 5 (five) minutes as needed for chest pain.  . pantoprazole (PROTONIX) 40 MG tablet Take 1 tablet (40 mg total) by mouth daily.  . potassium chloride SA (K-DUR,KLOR-CON) 20 MEQ tablet Take 3 tablets (60 mEq total) by mouth  daily.  . rivaroxaban (XARELTO) 20 MG TABS tablet Take 1 tablet (20 mg total) by mouth daily with supper.  Marland Kitchen spironolactone (ALDACTONE) 25 MG tablet Take 0.5 tablets (12.5 mg total) by mouth daily.  Marland Kitchen SYNTHROID 125 MCG tablet Take 125 mcg by mouth every morning.  . traZODone (DESYREL) 50 MG tablet Take 0.5 tablets (25 mg total) by mouth at bedtime as needed for sleep.  Marland Kitchen triamcinolone (NASACORT ALLERGY 24HR) 55 MCG/ACT AERO nasal inhaler Place 2 sprays into the nose daily.    BP 94/60 (BP Location: Left Arm, Patient Position: Sitting, Cuff Size: Normal)   Pulse 61   Wt 142 lb (64.4 kg)   SpO2 100%   BMI 20.97 kg/m  General: Well appearing female, NAD.  Neck: Supple, JVP flat.No thyromegaly or thyroid nodule.  Lungs: Clear bilaterally, normal effort.  CV: Nondisplaced PMI. Heart rate regular, S1/S2. No S3.  1/6 HSM at base.No peripheral edema.  No carotid bruit.  Normal pedal pulses.  Abdomen: Soft, Non tender, non distended.  no hepatosplenomegaly.  Skin: Intact without lesions or rashes.  Neurologic: A&O x 4.  Psych: Normal affect. Extremities: No clubbing or cyanosis.  HEENT: Normal.   Assessment/Plan: 1. CAD: No ischemic-type chest pain.  S/p anterior MI in 7/17 with DES to LAD   - She stopped taking Plavix on 09/19/16 as it has been one year since her MI.  - She has started taking ASA (not previously on ASA with need for Xarelto).  - Continue statin, last LDL was 42.  - Continue Coreg 6.25 mg BID - She has increased her Xarelto to 20mg  daily since she has stopped her Plavix. Discussed with HF PharmD to confirm dosing.   2. Chronic systolic CHF: Ischemic cardiomyopathy.  EF 25% on echo in 10/17, 30-35% on echo in 11/17. She has extensive LAD-territory scar. 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   - NYHA II - Volume status stable on exam, I suspect that she is little dry. She is only taking Lasix 20 mg daily, I think it would be ok for her to take prn lasix, but she has tried this in the past  and developed edema and prefers to take it daily. Will check BMET to assess renal function.  - Continue Coreg 3.125 mg BID - Continue Spiro 12.5 mg daily - Continue losartan 25 mg daily.    - No room to titrate up medications with soft BP.  - Will reassess EF with TEE in 2 weeks.   3. Mitral regurgitation: Moderate to severe on 10/17 echo, severe on 11/17 echo.  The posterior leaflet appeared restricted. Dr. Shirlee Latch does not think that this is ischemic MR.  Patient tells me that she has been told that she has had a murmur for years but never had an echo prior, I am concerned that she has had pre-existing primary mitral regurgitation that may end up needing to be repaired.According to Dr. Shirlee Latch her murmur has been less prominent the last couple of appointments.  -  Dr. Shirlee Latch plans for TEE in 2 weeks. Our office provided her with TEE patient instructions. All of her questions were answered regarding the procedure.   4. Atrial fibrillation: Unable to tolerate amiodarone due to GI side effects. - She is in NSR today.  - Continue Xarelto 20 mg daily.  - Per Dr. Shirlee Latch if atrial fibrillation becomes a problem in the future, she would be a Tikosyn or ablation candidate.   - Denies melena and hematochezia - Will get CBC today.   Follow up in 2 months with Dr. Shirlee Latch .   Little Ishikawa 09/22/2016

## 2016-09-22 NOTE — Patient Instructions (Signed)
Labs today We will only contact you if something comes back abnormal or we need to make some changes. Otherwise no news is good news!  Your physician has requested that you have a TEE/Cardioversion. During a TEE, sound waves are used to create images of your heart. It provides your doctor with information about the size and shape of your heart and how well your heart's chambers and valves are working. In this test, a transducer is attached to the end of a flexible tube that is guided down you throat and into your esophagus (the tube leading from your mouth to your stomach) to get a more detailed image of your heart. Once the TEE has determined that a blood clot is not present, the cardioversion begins. Electrical Cardioversion uses a jolt of electricity to your heart either through paddles or wired patches attached to your chest. This is a controlled, usually prescheduled, procedure. This procedure is done at the hospital and you are not awake during the procedure. You usually go home the day of the procedure. Please see the instruction sheet given to you today for more information.   Your physician recommends that you schedule a follow-up appointment in: 2 months with Dr Shirlee Latch

## 2016-09-25 ENCOUNTER — Telehealth (HOSPITAL_COMMUNITY): Payer: Self-pay | Admitting: *Deleted

## 2016-09-25 NOTE — Telephone Encounter (Signed)
TEE scheduled for 8/15 approved.  Berkley Harvey #[id number]   Valid Dates:  09/25/2016 - 10/24/2016

## 2016-10-03 ENCOUNTER — Other Ambulatory Visit: Payer: Self-pay | Admitting: Cardiology

## 2016-10-08 ENCOUNTER — Ambulatory Visit (HOSPITAL_BASED_OUTPATIENT_CLINIC_OR_DEPARTMENT_OTHER): Payer: BLUE CROSS/BLUE SHIELD

## 2016-10-08 ENCOUNTER — Encounter (HOSPITAL_COMMUNITY): Payer: Self-pay

## 2016-10-08 ENCOUNTER — Encounter (HOSPITAL_COMMUNITY)
Admission: RE | Disposition: A | Discharge status: Home / Self Care | Payer: Self-pay | Source: Ambulatory Visit | Attending: Cardiology

## 2016-10-08 ENCOUNTER — Ambulatory Visit (HOSPITAL_COMMUNITY)
Admission: RE | Admit: 2016-10-08 | Discharge: 2016-10-08 | Disposition: A | Discharge status: Home / Self Care | Payer: BLUE CROSS/BLUE SHIELD | Source: Ambulatory Visit | Attending: Cardiology | Admitting: Cardiology

## 2016-10-08 DIAGNOSIS — I5022 Chronic systolic (congestive) heart failure: Secondary | ICD-10-CM | POA: Diagnosis not present

## 2016-10-08 DIAGNOSIS — Z955 Presence of coronary angioplasty implant and graft: Secondary | ICD-10-CM | POA: Diagnosis not present

## 2016-10-08 DIAGNOSIS — I480 Paroxysmal atrial fibrillation: Secondary | ICD-10-CM | POA: Diagnosis not present

## 2016-10-08 DIAGNOSIS — Z7951 Long term (current) use of inhaled steroids: Secondary | ICD-10-CM | POA: Diagnosis not present

## 2016-10-08 DIAGNOSIS — I255 Ischemic cardiomyopathy: Secondary | ICD-10-CM | POA: Diagnosis not present

## 2016-10-08 DIAGNOSIS — Z7982 Long term (current) use of aspirin: Secondary | ICD-10-CM | POA: Insufficient documentation

## 2016-10-08 DIAGNOSIS — I252 Old myocardial infarction: Secondary | ICD-10-CM | POA: Insufficient documentation

## 2016-10-08 DIAGNOSIS — Z79899 Other long term (current) drug therapy: Secondary | ICD-10-CM | POA: Diagnosis not present

## 2016-10-08 DIAGNOSIS — E785 Hyperlipidemia, unspecified: Secondary | ICD-10-CM | POA: Insufficient documentation

## 2016-10-08 DIAGNOSIS — Z95810 Presence of automatic (implantable) cardiac defibrillator: Secondary | ICD-10-CM | POA: Insufficient documentation

## 2016-10-08 DIAGNOSIS — Z7901 Long term (current) use of anticoagulants: Secondary | ICD-10-CM | POA: Diagnosis not present

## 2016-10-08 DIAGNOSIS — I2510 Atherosclerotic heart disease of native coronary artery without angina pectoris: Secondary | ICD-10-CM | POA: Insufficient documentation

## 2016-10-08 DIAGNOSIS — I340 Nonrheumatic mitral (valve) insufficiency: Secondary | ICD-10-CM | POA: Insufficient documentation

## 2016-10-08 HISTORY — DX: Presence of automatic (implantable) cardiac defibrillator: Z95.810

## 2016-10-08 HISTORY — DX: Presence of cardiac pacemaker: Z95.0

## 2016-10-08 HISTORY — PX: TEE WITHOUT CARDIOVERSION: SHX5443

## 2016-10-08 SURGERY — ECHOCARDIOGRAM, TRANSESOPHAGEAL
Anesthesia: Moderate Sedation

## 2016-10-08 MED ORDER — FENTANYL CITRATE (PF) 100 MCG/2ML IJ SOLN
INTRAMUSCULAR | Status: DC | PRN
  Administered 2016-10-08 (×3): 25 ug via INTRAVENOUS

## 2016-10-08 MED ORDER — SODIUM CHLORIDE 0.9 % IV SOLN
INTRAVENOUS | Status: DC
  Administered 2016-10-08: 09:00:00 via INTRAVENOUS

## 2016-10-08 MED ORDER — MIDAZOLAM HCL 5 MG/ML IJ SOLN
INTRAMUSCULAR | Status: AC
  Filled 2016-10-08: qty 2

## 2016-10-08 MED ORDER — FENTANYL CITRATE (PF) 100 MCG/2ML IJ SOLN
INTRAMUSCULAR | Status: AC
  Filled 2016-10-08: qty 2

## 2016-10-08 MED ORDER — MIDAZOLAM HCL 10 MG/2ML IJ SOLN
INTRAMUSCULAR | Status: DC | PRN
Start: 2016-10-08 — End: 2016-10-08
  Administered 2016-10-08: 1 mg via INTRAVENOUS
  Administered 2016-10-08 (×2): 2 mg via INTRAVENOUS

## 2016-10-08 MED ORDER — BUTAMBEN-TETRACAINE-BENZOCAINE 2-2-14 % EX AERO
INHALATION_SPRAY | CUTANEOUS | Status: DC | PRN
  Administered 2016-10-08: 2 via TOPICAL

## 2016-10-08 NOTE — Progress Notes (Signed)
Was able to contact patient via phone number [phone] and made her aware that she is to stop taking her aspirin while on the xarelto.  Patient verbalized understanding.

## 2016-10-08 NOTE — Discharge Instructions (Signed)
Transesophageal Echocardiogram °Transesophageal echocardiography (TEE) is a picture test of your heart using sound waves. The pictures taken can give very detailed pictures of your heart. This can help your doctor see if there are problems with your heart. TEE can check: °· If your heart has blood clots in it. °· How well your heart valves are working. °· If you have an infection on the inside of your heart. °· Some of the major arteries of your heart. °· If your heart valve is working after a repair. °· Your heart before a procedure that uses a shock to your heart to get the rhythm back to normal. ° °What happens before the procedure? °· Do not eat or drink for 6 hours before the procedure or as told by your doctor. °· Make plans to have someone drive you home after the procedure. Do not drive yourself home. °· An IV tube will be put in your arm. °What happens during the procedure? °· You will be given a medicine to help you relax (sedative). It will be given through the IV tube. °· A numbing medicine will be sprayed or gargled in the back of your throat to help numb it. °· The tip of the probe is placed into the back of your mouth. You will be asked to swallow. This helps to pass the probe into your esophagus. °· Once the tip of the probe is in the right place, your doctor can take pictures of your heart. °· You may feel pressure at the back of your throat. °What happens after the procedure? °· You will be taken to a recovery area so the sedative can wear off. °· Your throat may be sore and scratchy. This will go away slowly over time. °· You will go home when you are fully awake and able to swallow liquids. °· You should have someone stay with you for the next 24 hours. °· Do not drive or operate machinery for the next 24 hours. °This information is not intended to replace advice given to you by your health care provider. Make sure you discuss any questions you have with your health care provider. °Document  Released: 12/08/2008 Document Revised: 07/19/2015 Document Reviewed: 08/12/2012 °Elsevier Interactive Patient Education © 2018 [employer] ° °

## 2016-10-08 NOTE — CV Procedure (Signed)
Procedure: TEE  Indication: Mitral regurgitation  Sedation: Versed 5 mg IV, Fentanyl 75 mcg IV  Findings: Please see echo section for full report.  Moderately dilated left ventricle with akinesis of the septal and anterior walls.  EF about 30%.  Normal RV size and systolic function.  Trivial TR.  Peak RV-RA gradient 31 mmHg.  The right atrium was normal in size.  The left atrium was mildly dilated, no LA appendage thrombus.  No ASD/PFO, negative bubble study.  Trileaflet aortic valve with no stenosis or regurgitation.  The mitral valve appeared structurally normal with moderate mitral regurgitation, suspect functional MR.  No pulmonary vein doppler systolic flow reversal noted.  Normal caliber aorta with minimal plaque.   Impression:  1. EF 30% with moderately dilated LV and akinetic septum/anterior wall.  2. Moderate functional mitral regurgitation.   Marca Ancona 10/08/2016 10:58 AM

## 2016-10-08 NOTE — H&P (View-Only) (Signed)
PCP: Dr. Margo Aye Cardiology: Dr. Clifton James HF Cardiology: Dr. Shirlee Latch  51 yo with history of CAD s/p anterior STEMI with DES to LAD, ischemic cardiomyopathy, mitral regurgitation, and paroxysmal atrial fibrillation presents for CHF clinic evaluation.  She was admitted in 7/17 with anterior STEMI.  She had a late presentation to the cath lab.  Most recent echo in 10/17 showed EF about 25% with LAD-territory wall motion abnormalities.  She had peri-MI atrial fibrillation and was started on Xarelto.  She was put on amiodarone.   After discharge, she continued to feel poorly.  She developed nausea, anorexia, and significant fatigue.  She was admitted for evaluation in 10/17 given concern for low output heart failure.  However, stopping amiodarone essentially resolved her symptoms. She had RHC showing preserved cardiac output but the PCWP was high due to prominent v-waves, presumably from significant mitral regurgitation.  Of note, she was in atrial fibrillation for a time during this admission.   She returns today for HF follow up. Overall feeling well, back to baseline activity wise. She is working daily and has plenty of energy. No SOB with walking into clinic, she can exercise without SOB. She feels dizzy at times when she bends over and stands up after doing some light gardening. She denies dizziness when she goes from sitting to standing. She is taking all of her medications with compliance. Denies chest pain. Weights are stable at home. She has a TEE planned for Aug 15th to assess her mitral regurgitation.   Labs (10/17): K 3.9, creatinine 1.24, hgb 10.3 Labs (11/17): K 4, creatinine 1.27 Labs (12/17): K 4.2, creatinine 1.12, BNP 439 Labs (2/18): K 4.7, creatinine 1.37 Labs (7/18): K 3.9, creatinine 1.14  ECG (personally reviewed): NSR, LAFB, poor RWP  PMH:  1. CAD: Anterior STEMI in 7/17 with late presentation to the cath lab. She had DES to proximal LAD.  No significant disease in other vessels.   2. Chronic systolic CHF: Ischemic cardiomyopathy.  - Echo 10/17 with EF 25%, akinesis of the mid-apical anteroseptal and anterior walls, apical dyskinesis, moderate to severe MR with incomplete coaptation.  - RHC (10/17): mean RA 4, PA 59/17 mean 38, mean PCWP 27 with prominent V waves suggesting significant MR, CI 3.06, PVR 2 WU.  - ACEI cough.  - Echo (11/17): EF 30-35%, normal RV size and systolic function, severe MR with restriction of posterior leaflet.   3. Atrial fibrillation: Paroxysmal.  Noted 7/17 admission peri-MI, also noted again 10/17 admission. She did not tolerate amiodarone due to nausea/anorexia.  4. Mitral regurgitation: Moderate to severe on 10/17 echo, incomplete coaptation.  ?Etiology, her MI (anterior) does not typically lead to ischemic MR.   - Echo in 11/17 with severe MR, posterior leaflet restricted.  5. Hyperlipidemia  SH: Married, lives in [place], works for the [employer], no smoking or ETOH.   Family History  Problem Relation Age of Onset  . Heart attack Father   . Arrhythmia Mother    ROS: All systems reviewed and negative except as per HPI.    Current Meds  Medication Sig  . aspirin EC 81 MG tablet Take 1 tablet (81 mg total) by mouth daily.  Marland Kitchen atorvastatin (LIPITOR) 80 MG tablet Take 1 tablet (80 mg total) by mouth daily at 6 PM.  . Calcium Carb-Cholecalciferol (CALCIUM + D3 PO) Take 1 tablet by mouth every morning.  . fexofenadine (ALLEGRA) 180 MG tablet Take 180 mg by mouth daily.  . furosemide (LASIX) 20 MG tablet Take  2 tablets (40 mg total) by mouth daily.  Marland Kitchen losartan (COZAAR) 25 MG tablet Take 1 tablet (25 mg total) by mouth at bedtime.  . nitroGLYCERIN (NITROSTAT) 0.4 MG SL tablet Place 1 tablet (0.4 mg total) under the tongue every 5 (five) minutes as needed for chest pain.  . pantoprazole (PROTONIX) 40 MG tablet Take 1 tablet (40 mg total) by mouth daily.  . potassium chloride SA (K-DUR,KLOR-CON) 20 MEQ tablet Take 3 tablets (60 mEq total) by mouth  daily.  . rivaroxaban (XARELTO) 20 MG TABS tablet Take 1 tablet (20 mg total) by mouth daily with supper.  Marland Kitchen spironolactone (ALDACTONE) 25 MG tablet Take 0.5 tablets (12.5 mg total) by mouth daily.  Marland Kitchen SYNTHROID 125 MCG tablet Take 125 mcg by mouth every morning.  . traZODone (DESYREL) 50 MG tablet Take 0.5 tablets (25 mg total) by mouth at bedtime as needed for sleep.  Marland Kitchen triamcinolone (NASACORT ALLERGY 24HR) 55 MCG/ACT AERO nasal inhaler Place 2 sprays into the nose daily.    BP 94/60 (BP Location: Left Arm, Patient Position: Sitting, Cuff Size: Normal)   Pulse 61   Wt 142 lb (64.4 kg)   SpO2 100%   BMI 20.97 kg/m  General: Well appearing female, NAD.  Neck: Supple, JVP flat.No thyromegaly or thyroid nodule.  Lungs: Clear bilaterally, normal effort.  CV: Nondisplaced PMI. Heart rate regular, S1/S2. No S3.  1/6 HSM at base.No peripheral edema.  No carotid bruit.  Normal pedal pulses.  Abdomen: Soft, Non tender, non distended.  no hepatosplenomegaly.  Skin: Intact without lesions or rashes.  Neurologic: A&O x 4.  Psych: Normal affect. Extremities: No clubbing or cyanosis.  HEENT: Normal.   Assessment/Plan: 1. CAD: No ischemic-type chest pain.  S/p anterior MI in 7/17 with DES to LAD   - She stopped taking Plavix on 09/19/16 as it has been one year since her MI.  - She has started taking ASA (not previously on ASA with need for Xarelto).  - Continue statin, last LDL was 42.  - Continue Coreg 6.25 mg BID - She has increased her Xarelto to 20mg  daily since she has stopped her Plavix. Discussed with HF PharmD to confirm dosing.   2. Chronic systolic CHF: Ischemic cardiomyopathy.  EF 25% on echo in 10/17, 30-35% on echo in 11/17. She has extensive LAD-territory scar. 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   - NYHA II - Volume status stable on exam, I suspect that she is little dry. She is only taking Lasix 20 mg daily, I think it would be ok for her to take prn lasix, but she has tried this in the past  and developed edema and prefers to take it daily. Will check BMET to assess renal function.  - Continue Coreg 3.125 mg BID - Continue Spiro 12.5 mg daily - Continue losartan 25 mg daily.    - No room to titrate up medications with soft BP.  - Will reassess EF with TEE in 2 weeks.   3. Mitral regurgitation: Moderate to severe on 10/17 echo, severe on 11/17 echo.  The posterior leaflet appeared restricted. Dr. Shirlee Latch does not think that this is ischemic MR.  Patient tells me that she has been told that she has had a murmur for years but never had an echo prior, I am concerned that she has had pre-existing primary mitral regurgitation that may end up needing to be repaired.According to Dr. Shirlee Latch her murmur has been less prominent the last couple of appointments.  -  Dr. Shirlee Latch plans for TEE in 2 weeks. Our office provided her with TEE patient instructions. All of her questions were answered regarding the procedure.   4. Atrial fibrillation: Unable to tolerate amiodarone due to GI side effects. - She is in NSR today.  - Continue Xarelto 20 mg daily.  - Per Dr. Shirlee Latch if atrial fibrillation becomes a problem in the future, she would be a Tikosyn or ablation candidate.   - Denies melena and hematochezia - Will get CBC today.   Follow up in 2 months with Dr. Shirlee Latch .   Little Ishikawa 09/22/2016

## 2016-10-08 NOTE — Interval H&P Note (Signed)
History and Physical Interval Note:  10/08/2016 10:32 AM  Catherine Mays  has presented today for surgery, with the diagnosis of MITRAL REGERGITATION  The various methods of treatment have been discussed with the patient and family. After consideration of risks, benefits and other options for treatment, the patient has consented to  Procedure(s): TRANSESOPHAGEAL ECHOCARDIOGRAM (TEE) (N/A) as a surgical intervention .  The patient's history has been reviewed, patient examined, no change in status, stable for surgery.  I have reviewed the patient's chart and labs.  Questions were answered to the patient's satisfaction.     Catherine Mays [employer]

## 2016-10-08 NOTE — Progress Notes (Signed)
  Echocardiogram Echocardiogram Transesophageal has been performed.  Arvil Chaco 10/08/2016, 12:27 PM

## 2016-10-09 ENCOUNTER — Encounter (HOSPITAL_COMMUNITY): Payer: Self-pay | Admitting: Cardiology

## 2016-10-29 ENCOUNTER — Telehealth: Payer: Self-pay | Admitting: Cardiology

## 2016-10-29 ENCOUNTER — Ambulatory Visit (INDEPENDENT_AMBULATORY_CARE_PROVIDER_SITE_OTHER): Payer: BLUE CROSS/BLUE SHIELD | Admitting: *Deleted

## 2016-10-29 DIAGNOSIS — I255 Ischemic cardiomyopathy: Secondary | ICD-10-CM

## 2016-10-29 NOTE — Telephone Encounter (Signed)
LMOVM reminding pt to send remote transmission.   

## 2016-10-30 NOTE — Progress Notes (Signed)
Remote ICD transmission.   

## 2016-11-04 ENCOUNTER — Encounter: Payer: Self-pay | Admitting: Cardiology

## 2016-11-06 ENCOUNTER — Other Ambulatory Visit (HOSPITAL_COMMUNITY): Payer: Self-pay | Admitting: Adult Health

## 2016-11-19 LAB — CUP PACEART REMOTE DEVICE CHECK
Battery Remaining Longevity: 92 mo
Battery Remaining Percentage: 91 %
Battery Voltage: 3.13 V
Brady Statistic RV Percent Paced: 1 %
Date Time Interrogation Session: 20180904060014
HighPow Impedance: 61 Ohm
HighPow Impedance: 61 Ohm
Implantable Lead Implant Date: 20171129
Implantable Lead Location: 753860
Implantable Pulse Generator Implant Date: 20171129
Lead Channel Impedance Value: 400 Ohm
Lead Channel Pacing Threshold Amplitude: 0.5 V
Lead Channel Pacing Threshold Pulse Width: 0.5 ms
Lead Channel Sensing Intrinsic Amplitude: 12 mV
Lead Channel Setting Pacing Amplitude: 2.5 V
Lead Channel Setting Pacing Pulse Width: 0.5 ms
Lead Channel Setting Sensing Sensitivity: 0.5 mV
Pulse Gen Serial Number: 7377983

## 2016-11-24 ENCOUNTER — Ambulatory Visit (HOSPITAL_COMMUNITY)
Admission: RE | Admit: 2016-11-24 | Discharge: 2016-11-24 | Disposition: A | Discharge status: Home / Self Care | Payer: BLUE CROSS/BLUE SHIELD | Source: Ambulatory Visit | Attending: Cardiology | Admitting: Cardiology

## 2016-11-24 ENCOUNTER — Encounter (HOSPITAL_COMMUNITY): Payer: Self-pay | Admitting: Cardiology

## 2016-11-24 VITALS — BP 94/63 | HR 61 | Wt 145.2 lb

## 2016-11-24 DIAGNOSIS — E785 Hyperlipidemia, unspecified: Secondary | ICD-10-CM | POA: Diagnosis not present

## 2016-11-24 DIAGNOSIS — Z7901 Long term (current) use of anticoagulants: Secondary | ICD-10-CM | POA: Diagnosis not present

## 2016-11-24 DIAGNOSIS — Z79899 Other long term (current) drug therapy: Secondary | ICD-10-CM | POA: Insufficient documentation

## 2016-11-24 DIAGNOSIS — Z955 Presence of coronary angioplasty implant and graft: Secondary | ICD-10-CM | POA: Insufficient documentation

## 2016-11-24 DIAGNOSIS — I255 Ischemic cardiomyopathy: Secondary | ICD-10-CM | POA: Insufficient documentation

## 2016-11-24 DIAGNOSIS — I480 Paroxysmal atrial fibrillation: Secondary | ICD-10-CM | POA: Diagnosis not present

## 2016-11-24 DIAGNOSIS — Z95810 Presence of automatic (implantable) cardiac defibrillator: Secondary | ICD-10-CM | POA: Insufficient documentation

## 2016-11-24 DIAGNOSIS — Z8249 Family history of ischemic heart disease and other diseases of the circulatory system: Secondary | ICD-10-CM | POA: Insufficient documentation

## 2016-11-24 DIAGNOSIS — I340 Nonrheumatic mitral (valve) insufficiency: Secondary | ICD-10-CM | POA: Diagnosis not present

## 2016-11-24 DIAGNOSIS — I5022 Chronic systolic (congestive) heart failure: Secondary | ICD-10-CM

## 2016-11-24 DIAGNOSIS — I252 Old myocardial infarction: Secondary | ICD-10-CM | POA: Diagnosis not present

## 2016-11-24 DIAGNOSIS — I2510 Atherosclerotic heart disease of native coronary artery without angina pectoris: Secondary | ICD-10-CM | POA: Diagnosis not present

## 2016-11-24 LAB — BASIC METABOLIC PANEL
Anion gap: 6 (ref 5–15)
BUN: 16 mg/dL (ref 6–20)
CO2: 31 mmol/L (ref 22–32)
Calcium: 9.4 mg/dL (ref 8.9–10.3)
Chloride: 101 mmol/L (ref 101–111)
Creatinine, Ser: 1.15 mg/dL — ABNORMAL HIGH (ref 0.44–1.00)
GFR calc Af Amer: >60 mL/min (ref >60)
GFR calc non Af Amer: 55 mL/min — ABNORMAL LOW (ref >60)
Glucose, Bld: 93 mg/dL (ref 65–99)
Potassium: 4.4 mmol/L (ref 3.5–5.1)
Sodium: 138 mmol/L (ref 135–145)

## 2016-11-24 MED ORDER — SPIRONOLACTONE 25 MG PO TABS: 25.0000 mg | ORAL_TABLET | Freq: Every day | ORAL | 3 refills | Status: DC

## 2016-11-24 MED ORDER — POTASSIUM CHLORIDE CRYS ER 20 MEQ PO TBCR: 40.0000 meq | EXTENDED_RELEASE_TABLET | Freq: Every day | ORAL | 3 refills | Status: DC

## 2016-11-24 NOTE — Patient Instructions (Signed)
Increase Spironolactone 25 mg every evening  Decrease Potasium  40 meq (2 tabs) daily  Labs drawn today (if we do not call you, then your lab work was stable)   Your physician recommends that you return for lab work in: 2 weeks Rx given   Your physician recommends that you schedule a follow-up appointment in: 3 months

## 2016-11-24 NOTE — Progress Notes (Signed)
PCP: Dr. Margo Aye Cardiology: Dr. Clifton James HF Cardiology: Dr. Shirlee Latch  51 yo with history of CAD s/p anterior STEMI with DES to LAD, ischemic cardiomyopathy, mitral regurgitation, and paroxysmal atrial fibrillation presents for followup of CHF and CAD.  She was admitted in 7/17 with anterior STEMI.  She had a late presentation to the cath lab.  Most recent echo in 10/17 showed EF about 25% with LAD-territory wall motion abnormalities.  She had peri-MI atrial fibrillation and was started on Xarelto.  She was put on amiodarone.   After discharge, she continued to feel poorly.  She developed nausea, anorexia, and significant fatigue.  She was admitted for evaluation in 10/17 given concern for low output heart failure.  However, stopping amiodarone essentially resolved her symptoms.  She had RHC showing preserved cardiac output but the PCWP was high due to prominent v-waves, presumably from significant mitral regurgitation.  Of note, she was in atrial fibrillation for a time during this admission.   She had [employer] ICD placed.  She is back at work for the [employer].   TEE in 8/18 to evaluate the mitral valve showed EF 30%, moderate MR (probably functional).    Symptomatically, she is doing well. No palpitations.  She is in NSR today.  No significant exertional edema or chest pain.  No orthopnea/PND.  Generally very active. BP remains soft but she denies lightheadedness or syncope.   Corevue: No VT, stable impedance.   Labs (10/17): K 3.9, creatinine 1.24, hgb 10.3 Labs (11/17): K 4, creatinine 1.27 Labs (12/17): K 4.2, creatinine 1.12, BNP 439 Labs (2/18): K 4.7, creatinine 1.37 Labs (7/18): K 3.9, creatinine 1.14, hgb 11.7  PMH:  1. CAD: Anterior STEMI in 7/17 with late presentation to the cath lab. She had DES to proximal LAD.  No significant disease in other vessels.  2. Chronic systolic CHF: Ischemic cardiomyopathy.  - Echo 10/17 with EF 25%, akinesis of the mid-apical anteroseptal  and anterior walls, apical dyskinesis, moderate to severe MR with incomplete coaptation.  - RHC (10/17): mean RA 4, PA 59/17 mean 38, mean PCWP 27 with prominent V waves suggesting significant MR, CI 3.06, PVR 2 WU.  - ACEI cough.  - Echo (11/17): EF 30-35%, normal RV size and systolic function, severe MR with restriction of posterior leaflet.   3. Atrial fibrillation: Paroxysmal.  Noted 7/17 admission peri-MI, also noted again 10/17 admission. She did not tolerate amiodarone due to nausea/anorexia.  4. Mitral regurgitation: Moderate to severe on 10/17 echo, incomplete coaptation.  ?Etiology, her MI (anterior) does not typically lead to ischemic MR.   - Echo in 11/17 with severe MR, posterior leaflet restricted.  - TEE (8/18): EF 30%, moderately dilated LV with akinetic septal and anterior walls, moderate functional MR, normal RV size and systolic function.  5. Hyperlipidemia  SH: Married, lives in [place], works for the [employer], no smoking or ETOH.   Family History  Problem Relation Age of Onset  . Heart attack Father   . Arrhythmia Mother    ROS: All systems reviewed and negative except as per HPI.   Current Outpatient Prescriptions  Medication Sig Dispense Refill  . atorvastatin (LIPITOR) 80 MG tablet Take 1 tablet (80 mg total) by mouth daily at 6 PM. 30 tablet 11  . Calcium Carb-Cholecalciferol (CALCIUM + D3 PO) Take 1 tablet by mouth every morning.    . carvedilol (COREG) 3.125 MG tablet Take 1 tablet (3.125 mg total) by mouth 2 (two) times daily with  a meal. 60 tablet 6  . furosemide (LASIX) 20 MG tablet Take 2 tablets (40 mg total) by mouth daily. 30 tablet 4  . pantoprazole (PROTONIX) 40 MG tablet Take 1 tablet (40 mg total) by mouth daily. 30 tablet 11  . potassium chloride SA (K-DUR,KLOR-CON) 20 MEQ tablet Take 3 tablets (60 mEq total) by mouth daily. 90 tablet 6  . Rivaroxaban (XARELTO) 15 MG TABS tablet Take 1 tablet (15 mg total) by mouth daily with supper. 30 tablet 6  .  SYNTHROID 125 MCG tablet Take 125 mcg by mouth every morning.  5  . ticagrelor (BRILINTA) 90 MG TABS tablet Take 1 tablet (90 mg total) by mouth 2 (two) times daily. 180 tablet 3  . traZODone (DESYREL) 50 MG tablet Take 0.5 tablets (25 mg total) by mouth at bedtime as needed for sleep. 30 tablet 0  . triamcinolone (NASACORT ALLERGY 24HR) 55 MCG/ACT AERO nasal inhaler Place 2 sprays into the nose daily.    Marland Kitchen losartan (COZAAR) 25 MG tablet Take 0.5 tablets (12.5 mg total) by mouth at bedtime. 15 tablet 3  . nitroGLYCERIN (NITROSTAT) 0.4 MG SL tablet Place 1 tablet (0.4 mg total) under the tongue every 5 (five) minutes as needed for chest pain. (Patient not taking: Reported on 12/11/2015) 25 tablet 3    BP 94/63   Pulse 61   Wt 145 lb 4 oz (65.9 kg)   SpO2 100%   BMI 21.45 kg/m  General: NAD Neck: No JVD, no thyromegaly or thyroid nodule.  Lungs: Clear to auscultation bilaterally with normal respiratory effort. CV: Nondisplaced PMI.  Heart regular S1/S2, no S3/S4, 1/6 HSM apex.  No peripheral edema.  No carotid bruit.  Normal pedal pulses.  Abdomen: Soft, nontender, no hepatosplenomegaly, no distention.  Skin: Intact without lesions or rashes.  Neurologic: Alert and oriented x 3.  Psych: Normal affect. Extremities: No clubbing or cyanosis.  HEENT: Normal.   Assessment/Plan: 1. CAD: No ischemic-type chest pain.  S/p anterior MI in 7/17 with DES to RCA.   - She is off Plavix now and taking only Xarelto 20 mg daily.  - Continue statin.   2. Chronic systolic CHF: Ischemic cardiomyopathy.  EF 25% on echo in 10/17, 30-35% on echo in 11/17, 30% on TEE in 8/18. She has extensive LAD-territory scar. 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  She is not volume overloaded on exam or by Corevue. NYHA class II symptoms.  BP remans soft but she denies lightheadedness.  - Continue Coreg 6.25 mg bid.    - Continue losartan 25 mg qhs.    - Continue Lasix 40 mg daily.    - Increase spironolactone to 25 mg daily, and  decreased KCl to 40 mEq daily.  BMET today and repeat in 10 days. 3. Mitral regurgitation: Moderate to severe on 10/17 echo, severe on 11/17 echo.  I did a TEE in 8/18.  This showed moderate MR that appeared to be functional.  No indication for surgery or Mitraclip.  4. Atrial fibrillation: Unable to tolerate amiodarone due to GI side effects.  She is in NSR today on Xarelto 20 mg daily. No palpitations suggestive of atrial fibrillation.  If atrial fibrillation becomes a problem in the future, she would be a Tikosyn or ablation candidate.    Followup in 3 months  Marca Ancona 11/24/2016

## 2016-11-26 ENCOUNTER — Other Ambulatory Visit (HOSPITAL_COMMUNITY): Payer: Self-pay | Admitting: Cardiology

## 2016-11-26 MED ORDER — FUROSEMIDE 20 MG PO TABS: 40.0000 mg | ORAL_TABLET | Freq: Every day | ORAL | 11 refills | Status: DC

## 2016-11-28 ENCOUNTER — Other Ambulatory Visit: Payer: Self-pay | Admitting: Cardiology

## 2016-12-16 ENCOUNTER — Other Ambulatory Visit: Payer: Self-pay | Admitting: Cardiology

## 2016-12-26 ENCOUNTER — Other Ambulatory Visit (HOSPITAL_COMMUNITY): Payer: Self-pay | Admitting: Cardiology

## 2017-01-07 ENCOUNTER — Telehealth (HOSPITAL_COMMUNITY): Payer: Self-pay | Admitting: *Deleted

## 2017-01-07 NOTE — Telephone Encounter (Signed)
Advanced Heart Failure Triage Encounter  Patient Name: Catherine Mays  Date of Call: 01/07/17  Problem: Low BP  Patient called stating that she really hasn't been feeling well for the past month, no energy.  She started checking her BP after speaking to someone in our office about a week ago and her readings are very low.  She normally runs low but she had several readings of 75/53, 86/46, and 86/50.    Plan:  Will send to Dr. Shirlee Latch to review medications and will call patient back with his recommendations.   Georgina Peer, RN

## 2017-01-07 NOTE — Telephone Encounter (Signed)
Decrease losartan to 12.5 mg daily and take at night.  Call with BP readings on lower dose.

## 2017-01-08 MED ORDER — LOSARTAN POTASSIUM 25 MG PO TABS: 12.5000 mg | ORAL_TABLET | Freq: Every day | ORAL | 3 refills | Status: DC

## 2017-01-08 NOTE — Telephone Encounter (Signed)
agreeable to med changes and med changes mad in [facility]

## 2017-01-19 ENCOUNTER — Telehealth (HOSPITAL_COMMUNITY): Payer: Self-pay | Admitting: *Deleted

## 2017-01-19 MED ORDER — CARVEDILOL 3.125 MG PO TABS: 3.1250 mg | ORAL_TABLET | Freq: Two times a day (BID) | ORAL | 3 refills | Status: DC

## 2017-01-19 NOTE — Telephone Encounter (Signed)
Pt called c/osystolic blood pressure readings in the 70's and 80's despite decrease in losartan.  Per Dr.McLean decrease carvedilol to 3.125mg  twice daily.  Patient aware and agreeable.

## 2017-01-19 NOTE — Telephone Encounter (Signed)
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  Advised ok to take.

## 2017-01-20 ENCOUNTER — Ambulatory Visit (INDEPENDENT_AMBULATORY_CARE_PROVIDER_SITE_OTHER): Payer: BLUE CROSS/BLUE SHIELD

## 2017-01-20 ENCOUNTER — Ambulatory Visit (INDEPENDENT_AMBULATORY_CARE_PROVIDER_SITE_OTHER): Payer: BLUE CROSS/BLUE SHIELD | Admitting: Cardiology

## 2017-01-20 ENCOUNTER — Encounter: Payer: Self-pay | Admitting: Cardiology

## 2017-01-20 VITALS — BP 100/82 | HR 87 | Ht 69.0 in | Wt 150.2 lb

## 2017-01-20 DIAGNOSIS — I493 Ventricular premature depolarization: Secondary | ICD-10-CM | POA: Diagnosis not present

## 2017-01-20 DIAGNOSIS — I255 Ischemic cardiomyopathy: Secondary | ICD-10-CM

## 2017-01-20 DIAGNOSIS — I25118 Atherosclerotic heart disease of native coronary artery with other forms of angina pectoris: Secondary | ICD-10-CM

## 2017-01-20 DIAGNOSIS — I5022 Chronic systolic (congestive) heart failure: Secondary | ICD-10-CM

## 2017-01-20 LAB — CUP PACEART INCLINIC DEVICE CHECK
Battery Remaining Longevity: 93 mo
Brady Statistic RV Percent Paced: 0 %
Date Time Interrogation Session: 20181127113758
HighPow Impedance: 64.125
Implantable Lead Implant Date: 20171129
Implantable Lead Location: 753860
Implantable Pulse Generator Implant Date: 20171129
Lead Channel Impedance Value: 412.5 Ohm
Lead Channel Pacing Threshold Amplitude: 0.5 V
Lead Channel Pacing Threshold Amplitude: 0.5 V
Lead Channel Pacing Threshold Pulse Width: 0.5 ms
Lead Channel Pacing Threshold Pulse Width: 0.5 ms
Lead Channel Sensing Intrinsic Amplitude: 12 mV
Lead Channel Setting Pacing Amplitude: 2.5 V
Lead Channel Setting Pacing Pulse Width: 0.5 ms
Lead Channel Setting Sensing Sensitivity: 0.5 mV
Pulse Gen Serial Number: 7377983

## 2017-01-20 NOTE — Progress Notes (Signed)
Electrophysiology Office Note   Date:  01/20/2017   ID:  Catherine Mays, DOB [birth year], MRN [id number]  PCP:  Benita StabileHall, John Z, MD  Cardiologist:  Janett LabellaMcAlhany, McLean Primary Electrophysiologist:  Regan LemmingWill Martin Ilean Spradlin, MD    Chief Complaint  Patient presents with  . Defib Check    Ischemic Cardiomyopathy/Chronic systolic CHF/PAF     History of Present Illness: Catherine Mays is a 51 y.o. female who presents today for electrophysiology evaluation.   History of CAD (STEMI 08/2015 s/p PTCA/DES to mid LAD, mild residual mRCA), ischemic cardiomyopathy, chronic systolic CHF, paroxysmal atrial fib (dx at time of STEMI), PVCs, hypotension. [facility] with chest pain that night and was sent home. She came back in later that night with continued chest pain and EKG showed ST elevation c/w an anterior MI. Emergent transport to [facility] and cardiac cath with occluded mid LAD treated with DES x 1 on 09/20/15. She was discharged with a LifeVest for LV dysfunction, amiodarone for her atrial fib, and triple blood thinner therapy for both her MI and afib. ASA stopped after one month. Coreg cut back due to fatigue. Aldactone stopped due to worsened kidney function. She was taken off amiodarone due to poor appetite, 20 lb weight loss, nausea, dyspnea, weakness, fatigue and cough. ICD implanted 01/23/16.  Today, denies symptoms of palpitations, chest pain, orthopnea, PND, lower extremity edema, claudication, dizziness, presyncope, syncope, bleeding, or neurologic sequela. The patient is tolerating medications without difficulties.  She does have some shortness of breath at times.  She is also been dealing with dizziness and hypotension.  Her blood pressure medications have been adjusted which has helped.  She is also having muscle aches and pains and was recently put on co-Q10.   Past Medical History:  Diagnosis Date  . AICD (automatic cardioverter/defibrillator) present    St. Jude    . CAD in native artery   . Chronic systolic CHF (congestive heart failure) (HCC)   . Hypotension    a. preventing med titration for HF.  Marland Kitchen. Hypothyroidism 09/24/2015  . Ischemic cardiomyopathy   . PAF (paroxysmal atrial fibrillation) (HCC) 09/24/2015   a. dx at time of STEMI 08/2015.  . Pre-diabetes   . Presence of permanent cardiac pacemaker   . PVC's (premature ventricular contractions)    Past Surgical History:  Procedure Laterality Date  . CARDIAC CATHETERIZATION N/A 09/20/2015   Procedure: Left Heart Cath and Coronary Angiography;  Surgeon: Kathleene Hazelhristopher D McAlhany, MD;  [facility];  Service: Cardiovascular;  Laterality: N/A;  . CARDIAC CATHETERIZATION N/A 09/20/2015   Procedure: Coronary Stent Intervention;  Surgeon: Kathleene Hazelhristopher D McAlhany, MD;  Location: [facility] INVASIVE CV LAB;  Service: Cardiovascular;  Laterality: N/A;  . CARDIAC CATHETERIZATION N/A 09/21/2015   Procedure: Left Heart Cath and Coronary Angiography;  Surgeon: Tonny BollmanMichael Cooper, MD;  [facility];  Service: Cardiovascular;  Laterality: N/A;  . CARDIAC CATHETERIZATION N/A 11/26/2015   Procedure: Right Heart Cath;  Surgeon: Laurey Moralealton S McLean, MD;  [facility];  Service: Cardiovascular;  Laterality: N/A;  . CORONARY STENT PLACEMENT  09/20/2015  . EP IMPLANTABLE DEVICE N/A 01/23/2016   Procedure: ICD Implant;  Surgeon: Chiniqua Kilcrease Jorja LoaMartin Melisse Caetano, MD;  Location: [facility] INVASIVE CV LAB;  Service: Cardiovascular;  Laterality: N/A;  . TEE WITHOUT CARDIOVERSION N/A 10/08/2016   Procedure: TRANSESOPHAGEAL ECHOCARDIOGRAM (TEE);  Surgeon: Laurey MoraleMcLean, Dalton S, MD;  [facility];  Service: Cardiovascular;  Laterality: N/A;  .  THYROIDECTOMY       Current Outpatient Medications  Medication Sig Dispense Refill  . atorvastatin (LIPITOR) 80 MG tablet Take 1 tablet (80 mg total) by mouth daily at 6 PM. 90 tablet 3  . Calcium Carb-Cholecalciferol (CALCIUM + D3 PO) Take 1 tablet by mouth every morning.     . carvedilol (COREG) 3.125 MG tablet Take 1 tablet (3.125 mg total) by mouth 2 (two) times daily with a meal. 60 tablet 3  . fexofenadine (ALLEGRA) 180 MG tablet Take 180 mg by mouth daily.    . furosemide (LASIX) 20 MG tablet Take 2 tablets (40 mg total) by mouth daily. 60 tablet 11  . losartan (COZAAR) 25 MG tablet Take 0.5 tablets (12.5 mg total) at bedtime by mouth. 45 tablet 3  . nitroGLYCERIN (NITROSTAT) 0.4 MG SL tablet Place 1 tablet (0.4 mg total) under the tongue every 5 (five) minutes as needed for chest pain. 25 tablet 3  . pantoprazole (PROTONIX) 40 MG tablet Take 1 tablet (40 mg total) by mouth daily. 90 tablet 3  . potassium chloride SA (K-DUR,KLOR-CON) 20 MEQ tablet Take 2 tablets (40 mEq total) by mouth daily. 60 tablet 3  . rivaroxaban (XARELTO) 20 MG TABS tablet Take 1 tablet (20 mg total) by mouth daily with supper. 90 tablet 3  . spironolactone (ALDACTONE) 25 MG tablet Take 1 tablet (25 mg total) by mouth daily. 30 tablet 3  . SYNTHROID 125 MCG tablet Take 125 mcg by mouth every morning.   5  . traZODone (DESYREL) 50 MG tablet Take 0.5 tablets (25 mg total) by mouth at bedtime as needed for sleep. 30 tablet 0  . triamcinolone (NASACORT ALLERGY 24HR) 55 MCG/ACT AERO nasal inhaler Place 2 sprays into the nose daily.     No current facility-administered medications for this visit.    Facility-Administered Medications Ordered in Other Visits  Medication Dose Route Frequency Provider Last Rate Last Dose  . bivalirudin (ANGIOMAX) 250 mg in sodium chloride 0.9 % 50 mL (5 mg/mL) infusion    Continuous PRN Kathleene Hazel, MD   Stopped at 09/20/15 [phone]  . bivalirudin (ANGIOMAX) BOLUS via infusion    PRN Kathleene Hazel, MD   54.45 mg at 09/20/15 4765  . fentaNYL (SUBLIMAZE) injection    PRN Kathleene Hazel, MD   25 mcg at 09/20/15 0651  . heparin 1,500 mL    PRN Verne Carrow D, MD      . heparin infusion 2 units/mL in 0.9 % sodium chloride     Continuous PRN Kathleene Hazel, MD   1,500 mL at 09/20/15 0733  . iopamidol (ISOVUE-370) 76 % injection    PRN Kathleene Hazel, MD   165 mL at 09/20/15 0733  . lidocaine (PF) (XYLOCAINE) 1 % injection    PRN Kathleene Hazel, MD   2 mL at 09/20/15 4650  . midazolam (VERSED) injection    PRN Kathleene Hazel, MD   1 mg at 09/20/15 0651  . nitroGLYCERIN 1 mg/10 ml (100 mcg/ml) - IR/CATH LAB    PRN Kathleene Hazel, MD   200 mcg at 09/20/15 3546  . Radial Cocktail/Verapamil only    PRN Kathleene Hazel, MD   10 mL at 09/20/15 0629  . ticagrelor (BRILINTA) tablet    PRN Kathleene Hazel, MD   180 mg at 09/20/15 [phone]  . tirofiban (AGGRASTAT) bolus via infusion    PRN Kathleene Hazel, MD   1,815 mcg  at 09/20/15 0647  . tirofiban (AGGRASTAT) infusion 50 mcg/mL 100 mL    Continuous PRN Kathleene Hazel, MD 13.1 mL/hr at 09/20/15 0651 0.15 mcg/kg/min at 09/20/15 8295    Allergies:   Paxil [paroxetine]; Spironolactone; Morphine and related; Percocet [oxycodone-acetaminophen]; Cefdinir; and Zolpidem   Social History:  The patient  reports that  has never smoked. she has never used smokeless tobacco. She reports that she does not drink alcohol or use drugs.   Family History:  The patient's family history includes Arrhythmia in her mother; Heart attack in her father.   ROS:  Please see the history of present illness.   Otherwise, review of systems is positive for back pain, muscle pain, dizziness.   All other systems are reviewed and negative.   PHYSICAL EXAM: VS:  BP 100/82   Pulse 87   Ht 5\' 9"  (1.753 m)   Wt 150 lb 3.2 oz (68.1 kg)   BMI 22.18 kg/m  , BMI Body mass index is 22.18 kg/m. GEN: Well nourished, well developed, in no acute distress  HEENT: normal  Neck: no JVD, carotid bruits, or masses Cardiac: iRRR; no murmurs, rubs, or gallops,no edema  Respiratory:  clear to auscultation bilaterally, normal work of breathing GI:  soft, nontender, nondistended, + BS MS: no deformity or atrophy  Skin: warm and dry, device site well healed Neuro:  Strength and sensation are intact Psych: euthymic mood, full affect  EKG:  EKG is ordered today. Personal review of the ekg ordered shows SR, frequent PVCs, LAE, anterior MI, rate 87  Personal review of the device interrogation today. Results in Paceart  Recent Labs: 03/17/2016: B Natriuretic Peptide 685.3 09/22/2016: Hemoglobin 11.4; Platelets 143 11/24/2016: BUN 16; Creatinine, Ser 1.15; Potassium 4.4; Sodium 138    Lipid Panel     Component Value Date/Time   CHOL 116 06/19/2016 0949   TRIG 86 06/19/2016 0949   HDL 57 06/19/2016 0949   CHOLHDL 2.0 06/19/2016 0949   VLDL 17 06/19/2016 0949   LDLCALC 42 06/19/2016 0949     Wt Readings from Last 3 Encounters:  01/20/17 150 lb 3.2 oz (68.1 kg)  11/24/16 145 lb 4 oz (65.9 kg)  10/08/16 140 lb (63.5 kg)      Other studies Reviewed: Additional studies/ records that were reviewed today include: TEE 10/27/16 Review of the above records today demonstrates:  - Left ventricle: Moderately dilated left ventricle with akinesis   of the septal and anterior walls. EF about 30%. - Aortic valve: There was no stenosis. - Aorta: Normal caliber aorta with minimal plaque. - Mitral valve: The mitral valve appeared structurally normal with   moderate mitral regurgitation, suspect functional MR. No   pulmonary vein doppler systolic flow reversal noted. - Left atrium: The atrium was mildly dilated. No evidence of   thrombus in the atrial cavity or appendage. - Right ventricle: The cavity size was normal. Systolic function   was normal. - Right atrium: No evidence of thrombus in the atrial cavity or   appendage. - Atrial septum: No defect or patent foramen ovale was identified.   Echo contrast study showed no right-to-left atrial level shunt,   at baseline or with provocation. - Tricuspid valve: Peak RV-RA gradient 31  mmHg.   ASSESSMENT AND PLAN:  1.  Coronary artery disease: Early no symptoms that are worrisome of coronary disease.  Continue current management.  2. Paroxysmal atrial fibrillation: None seen on this device check.  Continue Xarelto.  This patients CHA2DS2-VASc  Score and unadjusted Ischemic Stroke Rate (% per year) is equal to 3.2 % stroke rate/year from a score of 3  Above score calculated as 1 point each if present [CHF, HTN, DM, Vascular=MI/PAD/Aortic Plaque, Age if 65-74, or Female] Above score calculated as 2 points each if present [Age > 75, or Stroke/TIA/TE]  3. Ischemic cardiomyopathy: Saint Jude ICD implanted 01/23/16.  Is currently on optimal medical therapy with Coreg and Cozaar.  Her blood pressure has been low at times and thus heart failure medications have been decreased.  Device is functioning appropriately.  No changes at this time.  4.  Moderate mitral regurgitation, nonrheumatic: Likely functional due to ischemic cardiomyopathy per recent TEE.  No changes.  5.  PVCs: Burden seen on her monitor with a left bundle branch pattern.  It is possible that this is coming from her LV scar.  She is having symptoms of hypotension at times as well as shortness of breath.  This could be explained by her PVCs.  Tiffiney Sparrow order a 48-hour monitor to further determine her PVC burden.   Current medicines are reviewed at length with the patient today.   The patient does not have concerns regarding her medicines.  The following changes were made today: None  Labs/ tests ordered today include:  Orders Placed This Encounter  Procedures  . Holter monitor - 48 hour  . CUP PACEART INCLINIC DEVICE CHECK  . EKG 12-Lead     Disposition:   FU with Buzz Axel 3 months  Signed, Gicela Schwarting Jorja Loa, MD  01/20/2017 11:42 AM     [facility] [address] [place] [address] [phone] (office) [phone] (fax)

## 2017-01-20 NOTE — Patient Instructions (Signed)
Medication Instructions:  Your physician recommends that you continue on your current medications as directed. Please refer to the Current Medication list given to you today.  * If you need a refill on your cardiac medications before your next appointment, please call your pharmacy. *  Labwork: None ordered  Testing/Procedures: Your physician has recommended that you wear a 48 hour holter monitor. Holter monitors are medical devices that record the heart's electrical activity. Doctors most often use these monitors to diagnose arrhythmias. Arrhythmias are problems with the speed or rhythm of the heartbeat. The monitor is a small, portable device. You can wear one while you do your normal daily activities. This is usually used to diagnose what is causing palpitations/syncope (passing out).  Follow-Up: Your physician recommends that you schedule a follow-up appointment in: 3 months with Dr. Elberta Fortis.  Thank you for choosing [facility]!!   Dory Horn, RN [phone]  Any Other Special Instructions Will Be Listed Below (If Applicable).  Holter Monitoring A Holter monitor is a small device that is used to detect abnormal heart rhythms. It clips to your clothing and is connected by wires to flat, sticky disks (electrodes) that attach to your chest. It is worn continuously for 24-48 hours. Follow these instructions at home:  Wear your Holter monitor at all times, even while exercising and sleeping, for as long as directed by your health care provider.  Make sure that the Holter monitor is safely clipped to your clothing or close to your body as recommended by your health care provider.  Do not get the monitor or wires wet.  Do not put body lotion or moisturizer on your chest.  Keep your skin clean.  Keep a diary of your daily activities, such as walking and doing chores. If you feel that your heartbeat is abnormal or that your heart is fluttering or skipping a beat: ? Record what  you are doing when it happens. ? Record what time of day the symptoms occur.  Return your Holter monitor as directed by your health care provider.  Keep all follow-up visits as directed by your health care provider. This is important. Get help right away if:  You feel lightheaded or you faint.  You have trouble breathing.  You feel pain in your chest, upper arm, or jaw.  You feel sick to your stomach and your skin is pale, cool, or damp.  You heartbeat feels unusual or abnormal. This information is not intended to replace advice given to you by your health care provider. Make sure you discuss any questions you have with your health care provider. Document Released: 11/09/2003 Document Revised: 07/19/2015 Document Reviewed: 09/19/2013 Elsevier Interactive Patient Education  [employer].

## 2017-01-26 ENCOUNTER — Telehealth (HOSPITAL_COMMUNITY): Payer: Self-pay

## 2017-01-26 DIAGNOSIS — E785 Hyperlipidemia, unspecified: Secondary | ICD-10-CM

## 2017-01-26 NOTE — Telephone Encounter (Signed)
Pt is going to stop atorvastatin for I will contact her on 12/10 for f/u

## 2017-01-28 ENCOUNTER — Encounter: Payer: BLUE CROSS/BLUE SHIELD | Admitting: *Deleted

## 2017-01-28 ENCOUNTER — Telehealth: Payer: Self-pay | Admitting: Cardiology

## 2017-01-28 NOTE — Telephone Encounter (Signed)
Spoke with pt and reminded pt of remote transmission that is due today. Pt verbalized understanding.   

## 2017-01-30 ENCOUNTER — Encounter: Payer: Self-pay | Admitting: Cardiology

## 2017-02-02 MED ORDER — ROSUVASTATIN CALCIUM 20 MG PO TABS: 20.0000 mg | ORAL_TABLET | Freq: Every day | ORAL | 3 refills | Status: DC

## 2017-02-02 NOTE — Telephone Encounter (Signed)
Pt does feel better of Lipitor.agreeable to start Crestor, and lab appt has been made and orders placed

## 2017-02-03 ENCOUNTER — Encounter: Payer: BLUE CROSS/BLUE SHIELD | Admitting: Cardiology

## 2017-02-04 ENCOUNTER — Telehealth: Payer: Self-pay | Admitting: Cardiology

## 2017-02-04 NOTE — Telephone Encounter (Signed)
New Message  Pt call requesting to speak with RN about being reschedule back into Dr. Elberta Fortis schedule. Please call back to discuss

## 2017-02-05 NOTE — Telephone Encounter (Signed)
Pt doesn't need anything from the nurse.  She simply was rescheduling her cancelled appt earlier this week d/t inclement weather. She will see [place] tomorrow in the office.

## 2017-02-05 NOTE — Telephone Encounter (Signed)
°  Follow up  Returning call from yesterday. Please call.

## 2017-02-06 ENCOUNTER — Ambulatory Visit (INDEPENDENT_AMBULATORY_CARE_PROVIDER_SITE_OTHER): Payer: BLUE CROSS/BLUE SHIELD | Admitting: Cardiology

## 2017-02-06 ENCOUNTER — Encounter: Payer: Self-pay | Admitting: Cardiology

## 2017-02-06 VITALS — BP 124/64 | HR 61 | Ht 69.0 in | Wt 149.0 lb

## 2017-02-06 DIAGNOSIS — I255 Ischemic cardiomyopathy: Secondary | ICD-10-CM | POA: Diagnosis not present

## 2017-02-06 DIAGNOSIS — I493 Ventricular premature depolarization: Secondary | ICD-10-CM | POA: Diagnosis not present

## 2017-02-06 DIAGNOSIS — Z79899 Other long term (current) drug therapy: Secondary | ICD-10-CM

## 2017-02-06 DIAGNOSIS — I480 Paroxysmal atrial fibrillation: Secondary | ICD-10-CM

## 2017-02-06 MED ORDER — SOTALOL HCL 80 MG PO TABS: 80.0000 mg | ORAL_TABLET | Freq: Two times a day (BID) | ORAL | 3 refills | Status: DC

## 2017-02-06 NOTE — Patient Instructions (Addendum)
Medication Instructions:  Your physician has recommended you make the following change in your medication:  1. STOP Carvedilol 2. START Sotalol 80 mg twice daily - start this medication tomorrow, 12/15  * If you need a refill on your cardiac medications before your next appointment, please call your pharmacy. *  Labwork: Today: BMET & Magnesium level  Testing/Procedures: None ordered  Follow-Up: Your physician recommends that you schedule a follow-up appointment in: Monday, 12/17 for a nurse visit EKG.  Your physician recommends that you schedule a follow-up appointment in: 3 months with Dr. Elberta Fortis.  Thank you for choosing [facility]!!   Dory Horn, RN [phone]  Any Other Special Instructions Will Be Listed Below (If Applicable).  Sotalol tablets (Betapace AF) What is this medicine? SOTALOL (SOE ta lole) is a beta-blocker. Beta-blockers reduce the workload on the heart and help it to beat more regularly. This medicine is used to treat patients with an atrial heart arrhythmia such as atrial fibrillation. This medicine can help your heart return to and maintain a normal rhythm. This medicine may be used for other purposes; ask your health care provider or pharmacist if you have questions. COMMON BRAND NAME(S): BETAPACE AF What should I tell my health care provider before I take this medicine? They need to know if you have any of these conditions: -diabetes -heart or vessel disease like slow heart rate, worsening heart failure, heart block, sick sinus syndrome or Raynaud's disease -history of low levels of potassium or magnesium -kidney disease -liver disease -lung or breathing disease, like asthma or emphysema -pheochromocytoma -recent heart attack -thyroid disease -an unusual or allergic reaction to sotalol, other beta-blockers, medicines, foods, dyes, or preservatives -pregnant or trying to get pregnant -breast-feeding How should I use this medicine? Take  this medicine by mouth with a glass of water. Follow the directions on the prescription label. Take your doses at regular intervals. Do not take your medicine more often than directed. Do not stop taking this medicine suddenly. This could lead to serious heart-related effects. Talk to your pediatrician regarding the use of this medicine in children. Special care may be needed. While this medicine may be used in children for selected conditions precautions do apply. Overdosage: If you think you have taken too much of this medicine contact a poison control center or emergency room at once. NOTE: This medicine is only for you. Do not share this medicine with others. What if I miss a dose? If you miss a dose, take it as soon as you can. If it is almost time for your next dose, take only that dose. Do not take double or extra doses. What may interact with this medicine? Do not take this medicine with any of the following medications: -amoxapine -arsenic trioxide -certain antibiotics like gatifloxacin, grepafloxacin, levofloxacin, moxifloxacin, sparfloxacin, telithromycin -cisapride -droperidol -haloperidol -hawthorn -maprotiline -medicines for malaria like chloroquine and halofantrine -medicines to control heart rhythm -methadone -pentamidine -phenothiazines like prochlorperazine, perphenazine, thioridazine, and others -pimozide -ranolazine -tricyclic antidepressants like amitriptyline, imipramine, nortriptyline, and others -vardenafil -ziprasidone This medicine may also interact with the following medications: -antacids -certain antibiotics such as clarithromycin and erythromycin -clonidine -digoxin -medicines for angina or high blood pressure -medicines for colds and breathing difficulties -medicines for diabetes -other beta-blockers like atenolol, metoprolol, propranolol and others This list may not describe all possible interactions. Give your health care provider a list of all the  medicines, herbs, non-prescription drugs, or dietary supplements you use. Also tell them if you smoke, drink  alcohol, or use illegal drugs. Some items may interact with your medicine. What should I watch for while using this medicine? You will be started on this medicine in a specialized facility for the first two or more days of treatment. Visit your doctor or health care professional for regular checks on your progress. Check your heart rate and blood pressure regularly while you are taking this medicine. Ask your doctor or health care professional what your heart rate and blood pressure should be, and when you should contact him or her. Your doctor or health care professional also may schedule regular blood tests and electrocardiograms to check your progress. Because your condition and the use of this medicine carry some risk, it is a good idea to carry an identification card, necklace or bracelet with details of your condition, medications, and doctor or health care professional. Bonita QuinYou may get drowsy or dizzy. Do not drive, use machinery, or do anything that needs mental alertness until you know how this drug affects you. Do not stand or sit up quickly, especially if you are an older patient. This reduces the risk of dizzy or fainting spells. Alcohol can make you more drowsy and dizzy. Avoid alcoholic drinks. Do not treat yourself for coughs, colds, or pain while you are taking this medicine without asking your doctor or health care professional for advice. Some ingredients may increase your blood pressure. If you are going to have surgery, tell your doctor or health care professional that you are taking this medicine. What side effects may I notice from receiving this medicine? Side effects that you should report to your doctor or health care professional as soon as possible: -allergic reactions like skin rash, itching or hives, swelling of the face, lips, or tongue -chest pain -cold, tingling, or numb  hands or feet -confusion -diarrhea -difficulty breathing, wheezing -irregular heartbeat -muscle aches and pains -slow heart rate -sweating -swollen legs or ankles -tremor, shakes -vomiting Side effects that usually do not require medical attention (report to your doctor or health care professional if they continue or are bothersome): -change in sex drive or performance -mental depression -nausea -weakness or tiredness This list may not describe all possible side effects. Call your doctor for medical advice about side effects. You may report side effects to FDA at 1-800-FDA-1088. Where should I keep my medicine? Keep out of the reach of children. Store at room temperature between 15 and 30 degrees C (59 and 86 degrees F). Throw away any unused medicine after the expiration date. NOTE: This sheet is a summary. It may not cover all possible information. If you have questions about this medicine, talk to your doctor, pharmacist, or health care provider.  2018 Elsevier/Gold Standard (2012-10-12 15:17:49)

## 2017-02-06 NOTE — Progress Notes (Signed)
Electrophysiology Office Note   Date:  02/06/2017   ID:  Catherine Mays, DOB 1966/01/28, MRN [id number]  PCP:  Benita Stabile, MD  Cardiologist:  Janett Labella Primary Electrophysiologist:  Regan Lemming, MD    Chief Complaint  Patient presents with  . Congestive Heart Failure     History of Present Illness: Catherine Mays is a 51 y.o. female who presents today for electrophysiology evaluation.   History of CAD (STEMI 08/2015 s/p PTCA/DES to mid LAD, mild residual mRCA), ischemic cardiomyopathy, chronic systolic CHF, paroxysmal atrial fib (dx at time of STEMI), PVCs, hypotension. She had presented to the [facility] ED with chest pain that night and was sent home. She came back in later that night with continued chest pain and EKG showed ST elevation c/w an anterior MI. Emergent transport to [facility] and cardiac cath with occluded mid LAD treated with DES x 1 on 09/20/15. She was discharged with a LifeVest for LV dysfunction, amiodarone for her atrial fib, and triple blood thinner therapy for both her MI and afib. ASA stopped after one month. Coreg cut back due to fatigue. Aldactone stopped due to worsened kidney function. She was taken off amiodarone due to poor appetite, 20 lb weight loss, nausea, dyspnea, weakness, fatigue and cough. ICD implanted 01/23/16.  She was noted to have a high burden of PVCs with a Holter monitor showing a burden of 41%.  Today, denies symptoms of palpitations, chest pain, shortness of breath, orthopnea, PND, lower extremity edema, claudication, dizziness, presyncope, syncope, bleeding, or neurologic sequela. The patient is tolerating medications without difficulties.  Her main complaint is fatigue.  She is quite tired most of the time.  She says that this makes her more irritable.  He otherwise does not note palpitations.  She has not had any further episodes of chest pain.   Past Medical History:  Diagnosis Date  . AICD (automatic  cardioverter/defibrillator) present    St. Jude  . CAD in native artery   . Chronic systolic CHF (congestive heart failure) (HCC)   . Hypotension    a. preventing med titration for HF.  Marland Kitchen Hypothyroidism 09/24/2015  . Ischemic cardiomyopathy   . PAF (paroxysmal atrial fibrillation) (HCC) 09/24/2015   a. dx at time of STEMI 08/2015.  . Pre-diabetes   . Presence of permanent cardiac pacemaker   . PVC's (premature ventricular contractions)    Past Surgical History:  Procedure Laterality Date  . CARDIAC CATHETERIZATION N/A 09/20/2015   Procedure: Left Heart Cath and Coronary Angiography;  Surgeon: Kathleene Hazel, MD;  Location: [facility] INVASIVE CV LAB;  Service: Cardiovascular;  Laterality: N/A;  . CARDIAC CATHETERIZATION N/A 09/20/2015   Procedure: Coronary Stent Intervention;  Surgeon: Kathleene Hazel, MD;  Location: [facility] INVASIVE CV LAB;  Service: Cardiovascular;  Laterality: N/A;  . CARDIAC CATHETERIZATION N/A 09/21/2015   Procedure: Left Heart Cath and Coronary Angiography;  Surgeon: Tonny Bollman, MD;  Location: [facility] INVASIVE CV LAB;  Service: Cardiovascular;  Laterality: N/A;  . CARDIAC CATHETERIZATION N/A 11/26/2015   Procedure: Right Heart Cath;  Surgeon: [name], MD;  Location: [facility] INVASIVE CV LAB;  Service: Cardiovascular;  Laterality: N/A;  . CORONARY STENT PLACEMENT  09/20/2015  . EP IMPLANTABLE DEVICE N/A 01/23/2016   Procedure: ICD Implant;  Surgeon: Antwaun Buth Jorja Loa, MD;  Location: [facility] INVASIVE CV LAB;  Service: Cardiovascular;  Laterality: N/A;  . TEE WITHOUT CARDIOVERSION N/A 10/08/2016   Procedure: TRANSESOPHAGEAL ECHOCARDIOGRAM (TEE);  Surgeon: [name], MD;  Location: [facility] ENDOSCOPY;  Service: Cardiovascular;  Laterality: N/A;  . THYROIDECTOMY       Current Outpatient Medications  Medication Sig Dispense Refill  . Calcium Carb-Cholecalciferol (CALCIUM + D3 PO) Take 1 tablet by mouth every morning.    . fexofenadine (ALLEGRA) 180 MG tablet Take 180 mg  by mouth daily.    . furosemide (LASIX) 20 MG tablet Take 2 tablets (40 mg total) by mouth daily. 60 tablet 11  . losartan (COZAAR) 25 MG tablet Take 0.5 tablets (12.5 mg total) at bedtime by mouth. 45 tablet 3  . nitroGLYCERIN (NITROSTAT) 0.4 MG SL tablet Place 1 tablet (0.4 mg total) under the tongue every 5 (five) minutes as needed for chest pain. 25 tablet 3  . pantoprazole (PROTONIX) 40 MG tablet Take 1 tablet (40 mg total) by mouth daily. 90 tablet 3  . potassium chloride SA (K-DUR,KLOR-CON) 20 MEQ tablet Take 2 tablets (40 mEq total) by mouth daily. 60 tablet 3  . rivaroxaban (XARELTO) 20 MG TABS tablet Take 1 tablet (20 mg total) by mouth daily with supper. 90 tablet 3  . rosuvastatin (CRESTOR) 20 MG tablet Take 1 tablet (20 mg total) by mouth daily. 30 tablet 3  . sotalol (BETAPACE) 80 MG tablet Take 1 tablet (80 mg total) by mouth 2 (two) times daily. 60 tablet 3  . spironolactone (ALDACTONE) 25 MG tablet Take 1 tablet (25 mg total) by mouth daily. 30 tablet 3  . SYNTHROID 125 MCG tablet Take 125 mcg by mouth every morning.   5  . traZODone (DESYREL) 50 MG tablet Take 0.5 tablets (25 mg total) by mouth at bedtime as needed for sleep. 30 tablet 0  . triamcinolone (NASACORT ALLERGY 24HR) 55 MCG/ACT AERO nasal inhaler Place 2 sprays into the nose daily.     No current facility-administered medications for this visit.    Facility-Administered Medications Ordered in Other Visits  Medication Dose Route Frequency Provider Last Rate Last Dose  . bivalirudin (ANGIOMAX) 250 mg in sodium chloride 0.9 % 50 mL (5 mg/mL) infusion    Continuous PRN Kathleene Hazel, MD   Stopped at 09/20/15 [phone]  . bivalirudin (ANGIOMAX) BOLUS via infusion    PRN Kathleene Hazel, MD   54.45 mg at 09/20/15 1478  . fentaNYL (SUBLIMAZE) injection    PRN Kathleene Hazel, MD   25 mcg at 09/20/15 0651  . heparin 1,500 mL    PRN Verne Carrow D, MD      . heparin infusion 2 units/mL in 0.9 %  sodium chloride    Continuous PRN Kathleene Hazel, MD   1,500 mL at 09/20/15 0733  . iopamidol (ISOVUE-370) 76 % injection    PRN Kathleene Hazel, MD   165 mL at 09/20/15 0733  . lidocaine (PF) (XYLOCAINE) 1 % injection    PRN Kathleene Hazel, MD   2 mL at 09/20/15 2956  . midazolam (VERSED) injection    PRN Kathleene Hazel, MD   1 mg at 09/20/15 0651  . nitroGLYCERIN 1 mg/10 ml (100 mcg/ml) - IR/CATH LAB    PRN Kathleene Hazel, MD   200 mcg at 09/20/15 2130  . Radial Cocktail/Verapamil only    PRN Kathleene Hazel, MD   10 mL at 09/20/15 0629  . ticagrelor (BRILINTA) tablet    PRN Kathleene Hazel, MD   180 mg at 09/20/15 [phone]  . tirofiban (AGGRASTAT) bolus via infusion    PRN Kathleene Hazel,  MD   1,815 mcg at 09/20/15 0647  . tirofiban (AGGRASTAT) infusion 50 mcg/mL 100 mL    Continuous PRN Kathleene HazelMcAlhany, Christopher D, MD 13.1 mL/hr at 09/20/15 0651 0.15 mcg/kg/min at 09/20/15 [id number]    Allergies:   Paxil [paroxetine]; Spironolactone; Morphine and related; Percocet [oxycodone-acetaminophen]; Cefdinir; and Zolpidem   Social History:  The patient  reports that  has never smoked. she has never used smokeless tobacco. She reports that she does not drink alcohol or use drugs.   Family History:  The patient's family history includes Arrhythmia in her mother; Heart attack in her father.   ROS:  Please see the history of present illness.   Otherwise, review of systems is positive for fatigue.   All other systems are reviewed and negative.   PHYSICAL EXAM: VS:  BP 124/64   Pulse 61   Ht 5\' 9"  (1.753 m)   Wt 149 lb (67.6 kg)   SpO2 97%   BMI 22.00 kg/m  , BMI Body mass index is 22 kg/m. GEN: Well nourished, well developed, in no acute distress  HEENT: normal  Neck: no JVD, carotid bruits, or masses Cardiac: RRR; no murmurs, rubs, or gallops,no edema  Respiratory:  clear to auscultation bilaterally, normal work of breathing GI: soft,  nontender, nondistended, + BS MS: no deformity or atrophy  Skin: warm and dry, device site well healed Neuro:  Strength and sensation are intact Psych: euthymic mood, full affect  EKG:  EKG is not ordered today. Personal review of the ekg ordered 01/20/17 shows SR, PVCs  Recent Labs: 03/17/2016: B Natriuretic Peptide 685.3 09/22/2016: Hemoglobin 11.4; Platelets 143 11/24/2016: BUN 16; Creatinine, Ser 1.15; Potassium 4.4; Sodium 138    Lipid Panel     Component Value Date/Time   CHOL 116 06/19/2016 0949   TRIG 86 06/19/2016 0949   HDL 57 06/19/2016 0949   CHOLHDL 2.0 06/19/2016 0949   VLDL 17 06/19/2016 0949   LDLCALC 42 06/19/2016 0949     Wt Readings from Last 3 Encounters:  02/06/17 149 lb (67.6 kg)  01/20/17 150 lb 3.2 oz (68.1 kg)  11/24/16 145 lb 4 oz (65.9 kg)      Other studies Reviewed: Additional studies/ records that were reviewed today include: TEE 10/27/16 Review of the above records today demonstrates:  - Left ventricle: Moderately dilated left ventricle with akinesis   of the septal and anterior walls. EF about 30%. - Aortic valve: There was no stenosis. - Aorta: Normal caliber aorta with minimal plaque. - Mitral valve: The mitral valve appeared structurally normal with   moderate mitral regurgitation, suspect functional MR. No   pulmonary vein doppler systolic flow reversal noted. - Left atrium: The atrium was mildly dilated. No evidence of   thrombus in the atrial cavity or appendage. - Right ventricle: The cavity size was normal. Systolic function   was normal. - Right atrium: No evidence of thrombus in the atrial cavity or   appendage. - Atrial septum: No defect or patent foramen ovale was identified.   Echo contrast study showed no right-to-left atrial level shunt,   at baseline or with provocation. - Tricuspid valve: Peak RV-RA gradient 31 mmHg.  Holter 01/26/17 - personally reviewed Minimum HR: 58 BPM at 4:28:50 AM(2) Maximum HR: 113 BPM at  10:19:38 AM Average HR: 86 BPM Ventricular Beats: 100073 (41%) Sinus rhythm with PVCs and atrial runs  ASSESSMENT AND PLAN:  1.  Coronary artery disease: Clear symptoms worrisome for coronary disease.  Continue current  management.  2. Paroxysmal atrial fibrillation: None seen since hospitalization on device check.  Continue Xarelto.  This patients CHA2DS2-VASc Score and unadjusted Ischemic Stroke Rate (% per year) is equal to 3.2 % stroke rate/year from a score of 3  Above score calculated as 1 point each if present [CHF, HTN, DM, Vascular=MI/PAD/Aortic Plaque, Age if 65-74, or Female] Above score calculated as 2 points each if present [Age > 75, or Stroke/TIA/TE]  3. Ischemic cardiomyopathy: Saint Jude ICD implanted 01/23/16.  Currently on optimal medical therapy with Coreg and Cozaar.  4.  Moderate mitral regurgitation, nonrheumatic: Likely functional due to her ischemic cardiomyopathy.  No changes.  5.  PVCs: 41% burden on her most recent Holter monitor.  We Guiselle Mian plan to start her on sotalol as an outpatient.  She Suella Cogar return to clinic on Monday for an EKG.  We Nyelah Emmerich check a basic metabolic and magnesium today.  I did discuss with her the possibility of ablation in the future.  She would like to try medical management prior to that.  Plan to stop her carvedilol as her blood pressure has been low in the past.   Current medicines are reviewed at length with the patient today.   The patient does not have concerns regarding her medicines.  The following changes were made today: sotalol  Labs/ tests ordered today include:  Orders Placed This Encounter  Procedures  . Basic Metabolic Panel (BMET)  . Magnesium     Disposition:   FU with Rene Sizelove 3 months  Signed, Lashanna Angelo Jorja Loa, MD  02/06/2017 3:26 PM     [facility] [address] [place] [address] [phone] (office) [phone] (fax)

## 2017-02-07 LAB — MAGNESIUM: Magnesium: 2.1 mg/dL (ref 1.6–2.3)

## 2017-02-07 LAB — BASIC METABOLIC PANEL
BUN/Creatinine Ratio: 17 (ref 9–23)
BUN: 21 mg/dL (ref 6–24)
CO2: 27 mmol/L (ref 20–29)
Calcium: 8.5 mg/dL — ABNORMAL LOW (ref 8.7–10.2)
Chloride: 102 mmol/L (ref 96–106)
Creatinine, Ser: 1.23 mg/dL — ABNORMAL HIGH (ref 0.57–1.00)
GFR calc Af Amer: 59 mL/min/{1.73_m2} — ABNORMAL LOW (ref >59)
GFR calc non Af Amer: 51 mL/min/{1.73_m2} — ABNORMAL LOW (ref >59)
Glucose: 92 mg/dL (ref 65–99)
Potassium: 4.5 mmol/L (ref 3.5–5.2)
Sodium: 140 mmol/L (ref 134–144)

## 2017-02-09 ENCOUNTER — Ambulatory Visit (INDEPENDENT_AMBULATORY_CARE_PROVIDER_SITE_OTHER): Payer: BLUE CROSS/BLUE SHIELD | Admitting: Nurse Practitioner

## 2017-02-09 VITALS — BP 90/68 | HR 62 | Ht 69.0 in | Wt 148.5 lb

## 2017-02-09 DIAGNOSIS — I480 Paroxysmal atrial fibrillation: Secondary | ICD-10-CM

## 2017-02-09 DIAGNOSIS — I493 Ventricular premature depolarization: Secondary | ICD-10-CM | POA: Diagnosis not present

## 2017-02-09 NOTE — Patient Instructions (Signed)
Medication Instructions:  Your physician recommends that you continue on your current medications as directed. Please refer to the Current Medication list given to you today.   Labwork: None Ordered   Testing/Procedures: None Ordered   Follow-Up: Your physician recommends that you keep your follow-up appointments   Our office will contact you if Dr. Elberta Fortis wants to change any of your medications Call back with questions or concerns prior to next office visit   If you need a refill on your cardiac medications before your next appointment, please call your pharmacy.   Thank you for choosing [facility]! Eligha Bridegroom, RN [phone]

## 2017-02-09 NOTE — Progress Notes (Signed)
1.) Reason for visit: EKG for start of Sotalol 80 mg BID on Friday 12/14  2.) Name of MD requesting visit: Dr. Elberta Fortis  3.) H&P: Hx of PAF and high PVC burden, seen by Dr. Elberta Fortis on 12/14 for EP Evaluation  4.) ROS related to problem: Patient presents with husband in NAD. She states she feels "a little funny in my chest." States she feels that she needs more time before she can determine how she feels on this medication.   5.) Assessment and plan per MD: EKG reviewed by Dr. Eldridge Dace and patient advised to keep follow-up appointments. I advised her I will route visit information to Dr. Elberta Fortis and our office will contact her with additional orders. I advised her to call back with questions or concerns. She left the office in NAD.

## 2017-02-13 ENCOUNTER — Telehealth: Payer: Self-pay | Admitting: *Deleted

## 2017-02-13 MED ORDER — SOTALOL HCL 120 MG PO TABS: 120.0000 mg | ORAL_TABLET | Freq: Two times a day (BID) | ORAL | 3 refills | Status: DC

## 2017-02-13 NOTE — Telephone Encounter (Signed)
Advised patient to increase Sotalol to 120 mg BID, per Dr. Elberta Fortis. She will come by the office, next week, for follow up EKG on 12/28. Advised to call the office if she begins to experience adverse SE. Patient verbalized understanding and agreeable to plan.

## 2017-02-20 ENCOUNTER — Ambulatory Visit (INDEPENDENT_AMBULATORY_CARE_PROVIDER_SITE_OTHER): Payer: BLUE CROSS/BLUE SHIELD | Admitting: *Deleted

## 2017-02-20 VITALS — HR 70 | Ht 69.0 in | Wt 149.8 lb

## 2017-02-20 DIAGNOSIS — I493 Ventricular premature depolarization: Secondary | ICD-10-CM | POA: Diagnosis not present

## 2017-02-20 NOTE — Progress Notes (Signed)
1.  Reason for visit: Sotalol increase  2.  Name of MD requesting visit:  Camnitz  3. H&P:  See EPIC  4.  ROS related to problem:  n/a  5.  Assessment and plan per MD:    EKG performed still showing frequent PVCs.  Reviewed by Dr. Excell Seltzer, DOD, no orders received. Pt states she is feeling ok, but still tired/fatigued, but this is unchanged since last year. She understands I will discuss/review with Dr. Elberta Fortis when he returns 03/03/17.  She understands I will call her once discussed w/ Dr. Elberta Fortis and that ablation may be her next option.

## 2017-02-25 ENCOUNTER — Ambulatory Visit (HOSPITAL_COMMUNITY)
Admission: RE | Admit: 2017-02-25 | Discharge: 2017-02-25 | Disposition: A | Discharge status: Home / Self Care | Payer: BLUE CROSS/BLUE SHIELD | Source: Ambulatory Visit | Attending: Cardiology | Admitting: Cardiology

## 2017-02-25 VITALS — BP 92/60 | HR 55 | Wt 152.0 lb

## 2017-02-25 DIAGNOSIS — I2510 Atherosclerotic heart disease of native coronary artery without angina pectoris: Secondary | ICD-10-CM | POA: Diagnosis present

## 2017-02-25 DIAGNOSIS — I252 Old myocardial infarction: Secondary | ICD-10-CM | POA: Insufficient documentation

## 2017-02-25 DIAGNOSIS — I255 Ischemic cardiomyopathy: Secondary | ICD-10-CM | POA: Insufficient documentation

## 2017-02-25 DIAGNOSIS — Z79899 Other long term (current) drug therapy: Secondary | ICD-10-CM | POA: Diagnosis not present

## 2017-02-25 DIAGNOSIS — I480 Paroxysmal atrial fibrillation: Secondary | ICD-10-CM | POA: Insufficient documentation

## 2017-02-25 DIAGNOSIS — Z7901 Long term (current) use of anticoagulants: Secondary | ICD-10-CM | POA: Insufficient documentation

## 2017-02-25 DIAGNOSIS — I5022 Chronic systolic (congestive) heart failure: Secondary | ICD-10-CM | POA: Insufficient documentation

## 2017-02-25 DIAGNOSIS — R001 Bradycardia, unspecified: Secondary | ICD-10-CM | POA: Diagnosis not present

## 2017-02-25 DIAGNOSIS — I493 Ventricular premature depolarization: Secondary | ICD-10-CM | POA: Insufficient documentation

## 2017-02-25 DIAGNOSIS — E785 Hyperlipidemia, unspecified: Secondary | ICD-10-CM | POA: Diagnosis not present

## 2017-02-25 DIAGNOSIS — I340 Nonrheumatic mitral (valve) insufficiency: Secondary | ICD-10-CM | POA: Insufficient documentation

## 2017-02-25 LAB — CBC
HCT: 37 % (ref 36.0–46.0)
Hemoglobin: 11.8 g/dL — ABNORMAL LOW (ref 12.0–15.0)
MCH: 29.7 pg (ref 26.0–34.0)
MCHC: 31.9 g/dL (ref 30.0–36.0)
MCV: 93.2 fL (ref 78.0–100.0)
Platelets: 164 10*3/uL (ref 150–400)
RBC: 3.97 MIL/uL (ref 3.87–5.11)
RDW: 13.4 % (ref 11.5–15.5)
WBC: 8 10*3/uL (ref 4.0–10.5)

## 2017-02-25 LAB — BASIC METABOLIC PANEL
Anion gap: 7 (ref 5–15)
BUN: 23 mg/dL — ABNORMAL HIGH (ref 6–20)
CO2: 26 mmol/L (ref 22–32)
Calcium: 8.7 mg/dL — ABNORMAL LOW (ref 8.9–10.3)
Chloride: 102 mmol/L (ref 101–111)
Creatinine, Ser: 1.12 mg/dL — ABNORMAL HIGH (ref 0.44–1.00)
GFR calc Af Amer: >60 mL/min (ref >60)
GFR calc non Af Amer: 56 mL/min — ABNORMAL LOW (ref >60)
Glucose, Bld: 87 mg/dL (ref 65–99)
Potassium: 4.2 mmol/L (ref 3.5–5.1)
Sodium: 135 mmol/L (ref 135–145)

## 2017-02-25 LAB — LIPID PANEL
Cholesterol: 110 mg/dL (ref 0–200)
HDL: 51 mg/dL (ref >40)
LDL Cholesterol: 44 mg/dL (ref 0–99)
Total CHOL/HDL Ratio: 2.2 RATIO
Triglycerides: 76 mg/dL (ref <150)
VLDL: 15 mg/dL (ref 0–40)

## 2017-02-25 MED ORDER — SPIRONOLACTONE 25 MG PO TABS: 12.5000 mg | ORAL_TABLET | Freq: Every day | ORAL | 3 refills | Status: DC

## 2017-02-25 NOTE — Progress Notes (Signed)
PCP: Dr. Margo Aye Cardiology: Dr. Clifton James HF Cardiology: Dr. Shirlee Latch  52 y.o. with history of CAD s/p anterior STEMI with DES to LAD, ischemic cardiomyopathy, mitral regurgitation, and paroxysmal atrial fibrillation presents for followup of CHF and CAD.  She was admitted in 7/17 with anterior STEMI.  She had a late presentation to the cath lab.  Most recent echo in 10/17 showed EF about 25% with LAD-territory wall motion abnormalities.  She had peri-MI atrial fibrillation and was started on Xarelto.  She was put on amiodarone.   After discharge, she continued to feel poorly.  She developed nausea, anorexia, and significant fatigue.  She was admitted for evaluation in 10/17 given concern for low output heart failure.  However, stopping amiodarone essentially resolved her symptoms.  She had RHC showing preserved cardiac output but the PCWP was high due to prominent v-waves, presumably from significant mitral regurgitation.  Of note, she was in atrial fibrillation for a time during this admission.   She had St Jude ICD placed.  She is back at work for the [employer].   TEE in 8/18 to evaluate the mitral valve showed EF 30%, moderate MR (probably functional).    Since last appointment, patient was seen by Dr. Elberta Fortis and noted to have very frequent PVCs on device interrogation.  Holter in 12/18 showed 41% PVCs.  She was started on sotalol 80 mg bid and titrated up to 120 mg bid.  Coreg was stopped with the addition of sotalol and losartan was decreased to 12.5 mg daily due to low BP.    She continues to feel fatigued and reports SBP in 80s-90s most of the time.  No lightheadedness/syncope.  No significant dyspnea, mainly just tired.  Decreased palpitations.  Tolerating Crestor without myalgias.  No chest pain.  She feels like the fatigue is related to lower BP, feels like this has been more of a problem since going on sotalol. No orthopnea/PND.    St Jude device interrogated: No VT, stable  thoracic impedance.  ECG: NSR, LAE, old anterior MI, QTc 429 msec    Labs (10/17): K 3.9, creatinine 1.24, hgb 10.3 Labs (11/17): K 4, creatinine 1.27 Labs (12/17): K 4.2, creatinine 1.12, BNP 439 Labs (2/18): K 4.7, creatinine 1.37 Labs (7/18): K 3.9, creatinine 1.14, hgb 11.7 Labs (12/18): K 4.5, creatinine 1.23  PMH:  1. CAD: Anterior STEMI in 7/17 with late presentation to the cath lab. She had DES to proximal LAD.  No significant disease in other vessels.  2. Chronic systolic CHF: Ischemic cardiomyopathy.  - Echo 10/17 with EF 25%, akinesis of the mid-apical anteroseptal and anterior walls, apical dyskinesis, moderate to severe MR with incomplete coaptation.  - RHC (10/17): mean RA 4, PA 59/17 mean 38, mean PCWP 27 with prominent V waves suggesting significant MR, CI 3.06, PVR 2 WU.  - ACEI cough.  - Echo (11/17): EF 30-35%, normal RV size and systolic function, severe MR with restriction of posterior leaflet.   3. Atrial fibrillation: Paroxysmal.  Noted 7/17 admission peri-MI, also noted again 10/17 admission. She did not tolerate amiodarone due to nausea/anorexia.  4. Mitral regurgitation: Moderate to severe on 10/17 echo, incomplete coaptation.  ?Etiology, her MI (anterior) does not typically lead to ischemic MR.   - Echo in 11/17 with severe MR, posterior leaflet restricted.  - TEE (8/18): EF 30%, moderately dilated LV with akinetic septal and anterior walls, moderate functional MR, normal RV size and systolic function.  5. Hyperlipidemia 6. PVCs: Holter  12/18 with 41% PVCs.   SH: Married, lives in [place], works for the [employer], no smoking or ETOH.   Family History  Problem Relation Age of Onset  . Heart attack Father   . Arrhythmia Mother    ROS: All systems reviewed and negative except as per HPI.   Current Outpatient Prescriptions  Medication Sig Dispense Refill  . atorvastatin (LIPITOR) 80 MG tablet Take 1 tablet (80 mg total) by mouth daily at 6 PM. 30 tablet 11  .  Calcium Carb-Cholecalciferol (CALCIUM + D3 PO) Take 1 tablet by mouth every morning.    . carvedilol (COREG) 3.125 MG tablet Take 1 tablet (3.125 mg total) by mouth 2 (two) times daily with a meal. 60 tablet 6  . furosemide (LASIX) 20 MG tablet Take 2 tablets (40 mg total) by mouth daily. 30 tablet 4  . pantoprazole (PROTONIX) 40 MG tablet Take 1 tablet (40 mg total) by mouth daily. 30 tablet 11  . potassium chloride SA (K-DUR,KLOR-CON) 20 MEQ tablet Take 3 tablets (60 mEq total) by mouth daily. 90 tablet 6  . Rivaroxaban (XARELTO) 15 MG TABS tablet Take 1 tablet (15 mg total) by mouth daily with supper. 30 tablet 6  . SYNTHROID 125 MCG tablet Take 125 mcg by mouth every morning.  5  . ticagrelor (BRILINTA) 90 MG TABS tablet Take 1 tablet (90 mg total) by mouth 2 (two) times daily. 180 tablet 3  . traZODone (DESYREL) 50 MG tablet Take 0.5 tablets (25 mg total) by mouth at bedtime as needed for sleep. 30 tablet 0  . triamcinolone (NASACORT ALLERGY 24HR) 55 MCG/ACT AERO nasal inhaler Place 2 sprays into the nose daily.    Marland Kitchen losartan (COZAAR) 25 MG tablet Take 0.5 tablets (12.5 mg total) by mouth at bedtime. 15 tablet 3  . nitroGLYCERIN (NITROSTAT) 0.4 MG SL tablet Place 1 tablet (0.4 mg total) under the tongue every 5 (five) minutes as needed for chest pain. (Patient not taking: Reported on 12/11/2015) 25 tablet 3    BP 92/60   Pulse (!) 55   Wt 152 lb (68.9 kg)   SpO2 98%   BMI 22.45 kg/m  General: NAD Neck: No JVD, no thyromegaly or thyroid nodule.  Lungs: Clear to auscultation bilaterally with normal respiratory effort. CV: Nondisplaced PMI.  Heart regular S1/S2, no S3/S4, 1/6 HSM apex.  No peripheral edema.  No carotid bruit.  Normal pedal pulses.  Abdomen: Soft, nontender, no hepatosplenomegaly, no distention.  Skin: Intact without lesions or rashes.  Neurologic: Alert and oriented x 3.  Psych: Normal affect. Extremities: No clubbing or cyanosis.  HEENT: Normal.    Assessment/Plan: 1. CAD: No ischemic-type chest pain.  S/p anterior MI in 7/17 with DES to RCA.   - She is off Plavix now and taking only Xarelto 20 mg daily.  - Continue statin.   2. Chronic systolic CHF: Ischemic cardiomyopathy.  EF 25% on echo in 10/17, 30-35% on echo in 11/17, 30% on TEE in 8/18. She has extensive LAD-territory scar. 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  She is not volume overloaded on exam or by Corevue. NYHA class II-III symptoms due to prominent fatigue, not really much dyspnea.  BP remains soft, she thinks this has been worse with sotalol.  - Off Coreg with low BP and addition of sotalol.    - Continue losartan 12.5 mg qhs.    - Continue Lasix 20 mg daily.    - Decrease spironolactone to 12.5 mg daily with  low BP.  - BMET today.  3. Mitral regurgitation: Moderate to severe on 10/17 echo, severe on 11/17 echo.  I did a TEE in 8/18.  This showed moderate MR that appeared to be functional.  No indication for surgery or Mitraclip.  4. Atrial fibrillation: Unable to tolerate amiodarone due to GI side effects.  She is in NSR today on Xarelto 20 mg daily and sotalol. CBC today.  5. PVCs: Very frequent on 12/18 holter (41% beats), agree that we need to try to suppress this to prevent worsening of cardiomyopathy.  She is more fatigued on sotalol, not sure how well she will tolerate long-term.  Consider PVC ablation to possibly allow [place] to cut back on sotalol.  She will see Dr. Elberta Fortis to discuss.    6. Hyperlipidemia: Check lipids on Crestor, goal LDL < 70.  No myalgias now that she is on coenzyme Q10.   Followup in 3 months  Marca Ancona 02/25/2017

## 2017-02-25 NOTE — Patient Instructions (Signed)
Decrease Spironolactone 12.5 mg (1/2 tab) daily  Labs drawn today (if we do not call you, then your lab work was stable)   Your physician recommends that you schedule a follow-up appointment in: 3 months with Dr. Shirlee Latch  (we will call you)

## 2017-02-26 ENCOUNTER — Encounter (HOSPITAL_COMMUNITY): Payer: Self-pay

## 2017-03-03 ENCOUNTER — Telehealth: Payer: Self-pay | Admitting: Cardiology

## 2017-03-03 NOTE — Telephone Encounter (Signed)
New Message  Pt call requesting to speak with RN about scheduling an ablation

## 2017-03-03 NOTE — Telephone Encounter (Signed)
Pt would like to know if Dr. Elberta Fortis feels she needs to try another medication or if ablation is the best next step. She understands I will discuss with him Thursday and let her know.

## 2017-03-04 NOTE — Addendum Note (Signed)
Addended by: Baird Lyons on: 03/04/2017 03:11 PM   Modules accepted: Orders

## 2017-03-05 ENCOUNTER — Telehealth (HOSPITAL_COMMUNITY): Payer: Self-pay | Admitting: *Deleted

## 2017-03-05 MED ORDER — CO-ENZYME Q-10 50 MG PO CAPS: 200.0000 mg | ORAL_CAPSULE | Freq: Every day | ORAL | 3 refills | Status: DC

## 2017-03-05 NOTE — Telephone Encounter (Signed)
Try coenzyme Q10 200 mg daily.

## 2017-03-05 NOTE — Telephone Encounter (Signed)
Advanced Heart Failure Triage Encounter  Patient Name: Catherine Mays  Date of Call: 03/05/17  Problem: Muscle Pain  Patient called stating that she is stating to experience the same muscle pain with Crestor as she was with lipitor.  Pain started in her shoulders and has moved to her hips.    Plan:  Will send to Dr. Shirlee Latch to review and will call patient back with any medication changes.   Georgina Peer, RN

## 2017-03-05 NOTE — Telephone Encounter (Signed)
Advised that Dr. Elberta Fortis recommends proceeding w/ ablation. She understands I will schedule and call her next week to arrange/review details. She is agreeable to plan.

## 2017-03-05 NOTE — Telephone Encounter (Signed)
I called patient back and she is agreeable with plan.  Medication list updated and coenzyme send to pharmacy.

## 2017-03-09 ENCOUNTER — Other Ambulatory Visit: Payer: Self-pay | Admitting: Internal Medicine

## 2017-03-17 ENCOUNTER — Telehealth: Payer: Self-pay | Admitting: Cardiology

## 2017-03-17 NOTE — Telephone Encounter (Signed)
New message    Patient calling back to speak with the nurse on pre-op instructions upcoming ablation.

## 2017-03-18 NOTE — Telephone Encounter (Signed)
Informed pt I would call to discuss by the end of the week. She appreciates the update

## 2017-03-19 NOTE — Telephone Encounter (Signed)
Ablations instructions reviewed w/ patient. Scheduled for H&P next week. Patient verbalized understanding and agreeable to plan.

## 2017-03-20 ENCOUNTER — Encounter: Payer: Self-pay | Admitting: *Deleted

## 2017-03-22 IMAGING — DX DG CHEST 2V
2 series · 2 of 2 positions shown · non-contrast
Comparison: None.

CLINICAL DATA: Pt c/o nausea, dyspnea, weakness, fatigue, cough.
She has been coughing for 3 weeks. She can only sleep sitting up.
When she is supine, she coughs. Cardiac hx began on [REDACTED] with a
MI and includes CAD, CHF

EXAM:
CHEST  2 VIEW

[chest pa]
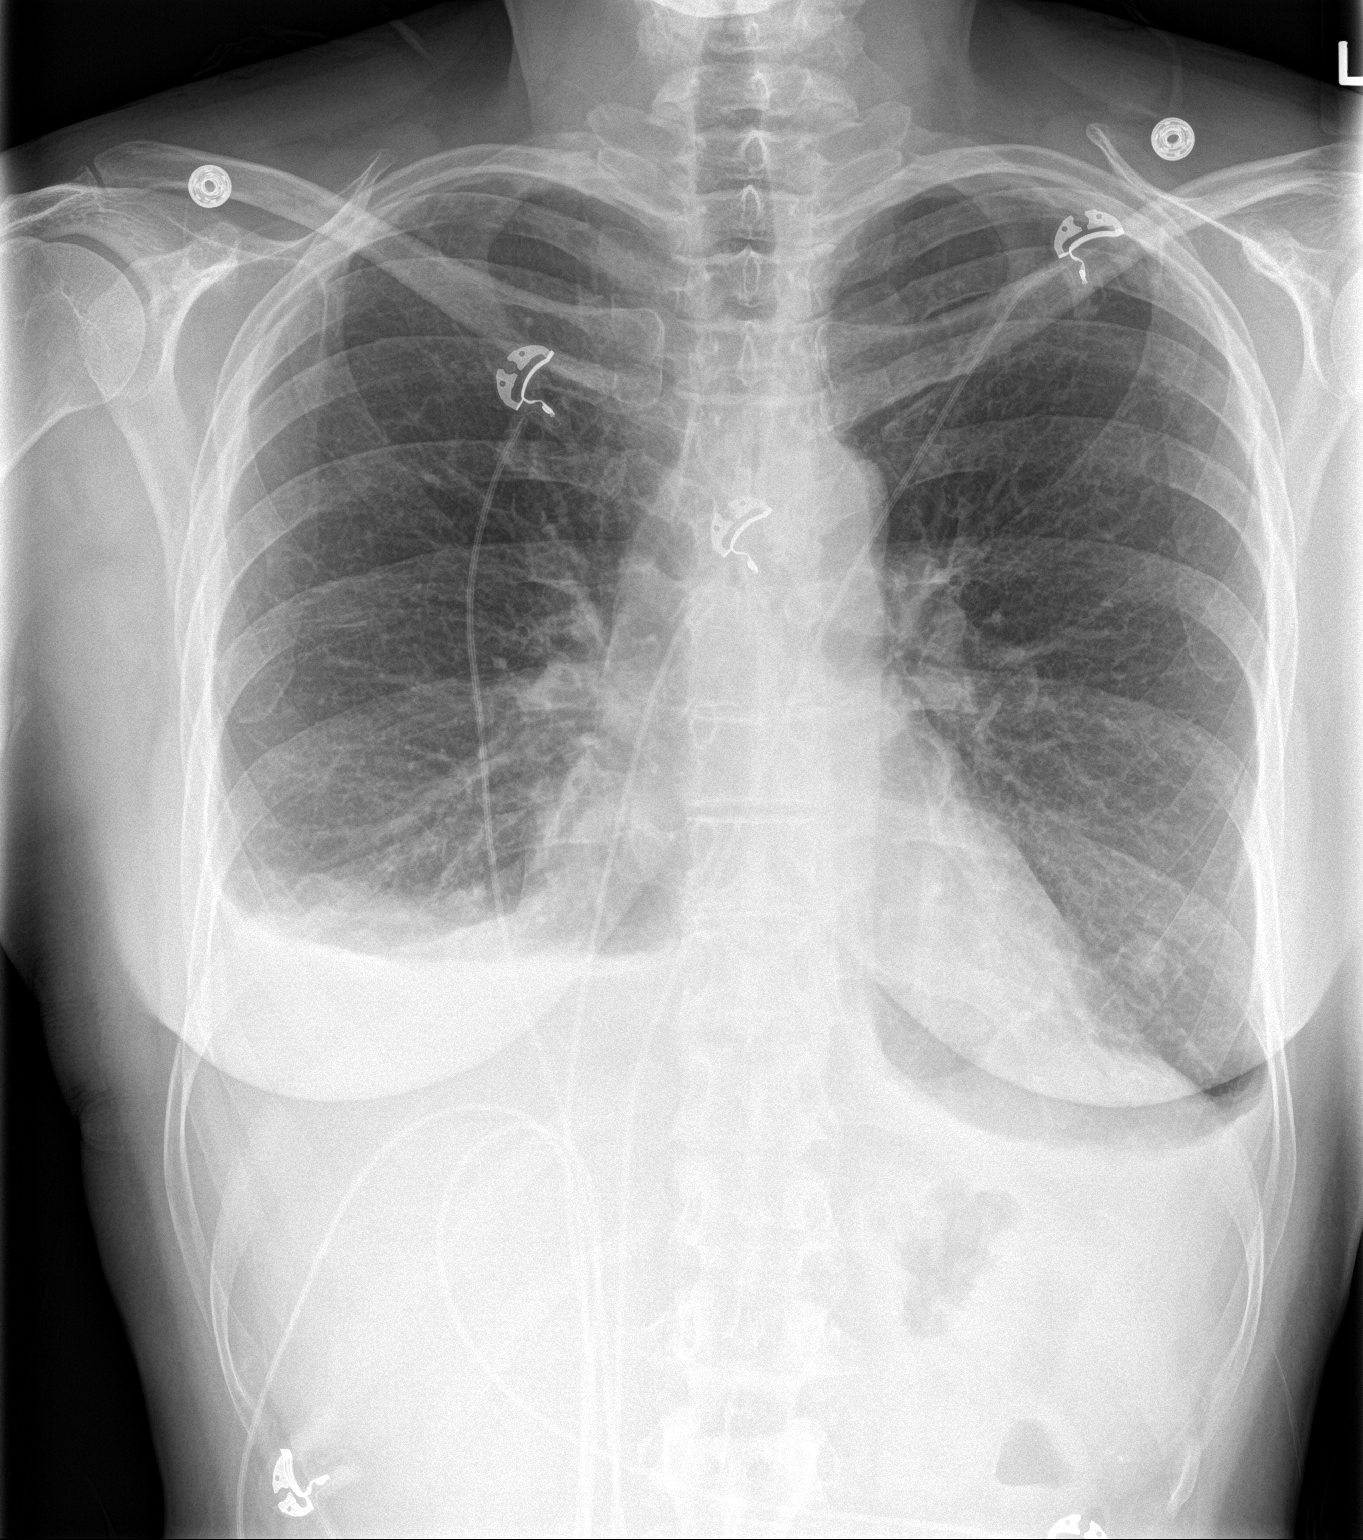

[chest lat]
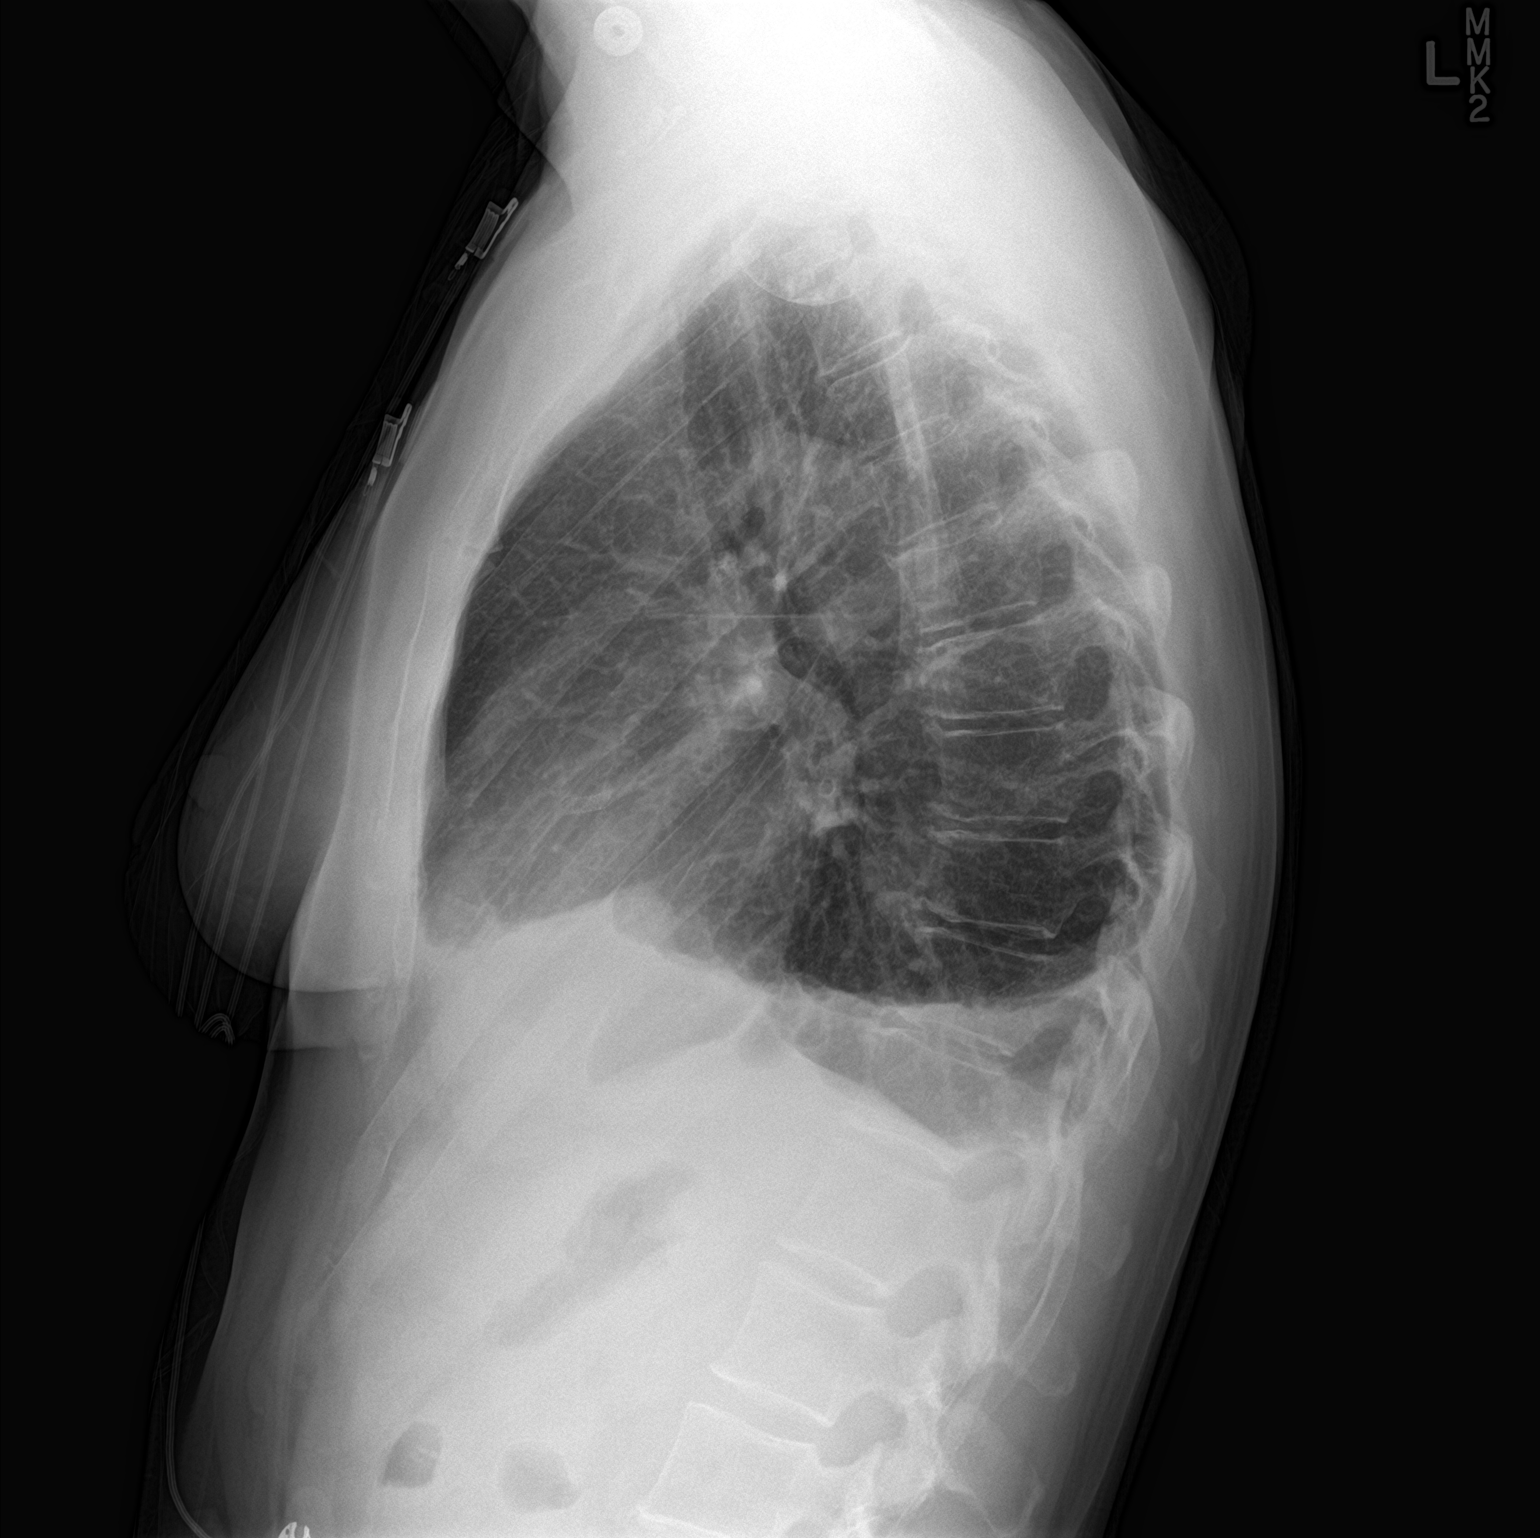

[2 of 2 positions shown; findings below may reference images not displayed]

FINDINGS: Moderate right and small left pleural effusions. Mild associated
streaky lung base opacity consistent with atelectasis. No pulmonary
edema or convincing pneumonia. No pneumothorax.

Cardiac silhouette is normal in size. Normal mediastinal and hilar
contours.

Bony thorax is intact.
IMPRESSION: 1. Moderate right and small pleural effusions with mild associated
basilar atelectasis.
2. No evidence of pneumonia or pulmonary edema.

## 2017-03-24 ENCOUNTER — Ambulatory Visit (INDEPENDENT_AMBULATORY_CARE_PROVIDER_SITE_OTHER): Payer: BLUE CROSS/BLUE SHIELD | Admitting: Cardiology

## 2017-03-24 ENCOUNTER — Encounter: Payer: Self-pay | Admitting: Cardiology

## 2017-03-24 ENCOUNTER — Other Ambulatory Visit: Payer: Self-pay | Admitting: Cardiology

## 2017-03-24 ENCOUNTER — Telehealth: Payer: Self-pay | Admitting: Cardiovascular Disease

## 2017-03-24 VITALS — BP 80/60 | HR 59 | Ht 69.0 in | Wt 150.2 lb

## 2017-03-24 DIAGNOSIS — I480 Paroxysmal atrial fibrillation: Secondary | ICD-10-CM

## 2017-03-24 DIAGNOSIS — Z01812 Encounter for preprocedural laboratory examination: Secondary | ICD-10-CM | POA: Diagnosis not present

## 2017-03-24 DIAGNOSIS — I255 Ischemic cardiomyopathy: Secondary | ICD-10-CM

## 2017-03-24 DIAGNOSIS — I493 Ventricular premature depolarization: Secondary | ICD-10-CM

## 2017-03-24 NOTE — Progress Notes (Signed)
Electrophysiology Office Note   Date:  03/24/2017   ID:  Catherine Mays, DOB February 23, 1966, MRN [id number]  PCP:  Benita Stabile, MD  Cardiologist:  Catherine Mays Primary Electrophysiologist:  Catherine Lemming, MD    Chief Complaint  Patient presents with  . Defib Check    PVC's/H&P pre-labs     History of Present Illness: Catherine Mays is a 52 y.o. female who presents today for electrophysiology evaluation.   History of CAD (STEMI 08/2015 s/p PTCA/DES to mid LAD, mild residual mRCA), ischemic cardiomyopathy, chronic systolic CHF, paroxysmal atrial fib (dx at time of STEMI), PVCs, hypotension. She had presented to the [facility] ED with chest pain that night and was sent home. She came back in later that night with continued chest pain and EKG showed ST elevation c/w an anterior MI. Emergent transport to [facility] and cardiac cath with occluded mid LAD treated with DES x 1 on 09/20/15. She was discharged with a LifeVest for LV dysfunction, amiodarone for her atrial fib, and triple blood thinner therapy for both her MI and afib. ASA stopped after one month. Coreg cut back due to fatigue. Aldactone stopped due to worsened kidney function. She was taken off amiodarone due to poor appetite, 20 lb weight loss, nausea, dyspnea, weakness, fatigue and cough. ICD implanted 01/23/16.  She was noted to have a high burden of PVCs with a Holter monitor showing a burden of 41%.  Today, denies symptoms of palpitations, chest pain, shortness of breath, orthopnea, PND, lower extremity edema, claudication, dizziness, presyncope, syncope, bleeding, or neurologic sequela. The patient is tolerating medications without difficulties.  She is currently feeling well without major complaint.  She has not noted an increased burden of palpitations.   Past Medical History:  Diagnosis Date  . AICD (automatic cardioverter/defibrillator) present    St. Jude  . CAD in native artery   . Chronic systolic CHF  (congestive heart failure) (HCC)   . Hypotension    a. preventing med titration for HF.  Marland Kitchen Hypothyroidism 09/24/2015  . Ischemic cardiomyopathy   . PAF (paroxysmal atrial fibrillation) (HCC) 09/24/2015   a. dx at time of STEMI 08/2015.  . Pre-diabetes   . Presence of permanent cardiac pacemaker   . PVC's (premature ventricular contractions)    Past Surgical History:  Procedure Laterality Date  . CARDIAC CATHETERIZATION N/A 09/20/2015   Procedure: Left Heart Cath and Coronary Angiography;  Surgeon: Kathleene Hazel, MD;  Location[facility] INVASIVE CV LAB;  Service: Cardiovascular;  Laterality: N/A;  . CARDIAC CATHETERIZATION N/A 09/20/2015   Procedure: Coronary Stent Intervention;  Surgeon: Kathleene Hazel, MD;  Location: [facility] INVASIVE CV LAB;  Service: Cardiovascular;  Laterality: N/A;  . CARDIAC CATHETERIZATION N/A 09/21/2015   Procedure: Left Heart Cath and Coronary Angiography;  Surgeon: Tonny Bollman, MD;  Location: [facility] INVASIVE CV LAB;  Service: Cardiovascular;  Laterality: N/A;  . CARDIAC CATHETERIZATION N/A 11/26/2015   Procedure: Right Heart Cath;  Surgeon: [name], MD;  Location: [facility] INVASIVE CV LAB;  Service: Cardiovascular;  Laterality: N/A;  . CORONARY STENT PLACEMENT  09/20/2015  . EP IMPLANTABLE DEVICE N/A 01/23/2016   Procedure: ICD Implant;  Surgeon: Catherine Smither Jorja Loa, MD;  Location: [facility] INVASIVE CV LAB;  Service: Cardiovascular;  Laterality: N/A;  . TEE WITHOUT CARDIOVERSION N/A 10/08/2016   Procedure: TRANSESOPHAGEAL ECHOCARDIOGRAM (TEE);  Surgeon: [name], MD;  Location: [facility] ENDOSCOPY;  Service: Cardiovascular;  Laterality: N/A;  . THYROIDECTOMY  Current Outpatient Medications  Medication Sig Dispense Refill  . Calcium Carb-Cholecalciferol (CALCIUM + D3 PO) Take 1 tablet by mouth every morning.    Marland Kitchen co-enzyme Q-10 50 MG capsule Take 4 capsules (200 mg total) by mouth daily. 120 capsule 3  . Coenzyme Q10 (COQ10) 200 MG CAPS Take 200 mg by mouth  daily.    . fexofenadine (ALLEGRA) 180 MG tablet Take 180 mg by mouth daily.    . furosemide (LASIX) 20 MG tablet Take 2 tablets (40 mg total) by mouth daily. (Patient taking differently: Take 20 mg by mouth daily. ) 60 tablet 11  . ibuprofen (ADVIL,MOTRIN) 200 MG tablet Take 400 mg by mouth every 8 (eight) hours as needed for mild pain.    Marland Kitchen losartan (COZAAR) 25 MG tablet Take 0.5 tablets (12.5 mg total) at bedtime by mouth. 45 tablet 3  . nitroGLYCERIN (NITROSTAT) 0.4 MG SL tablet Place 1 tablet (0.4 mg total) under the tongue every 5 (five) minutes as needed for chest pain. (Patient taking differently: Place 0.4 mg under the tongue every 5 (five) minutes as needed for chest pain. Chest pain) 25 tablet 3  . pantoprazole (PROTONIX) 40 MG tablet Take 1 tablet (40 mg total) by mouth daily. 90 tablet 3  . potassium chloride SA (K-DUR,KLOR-CON) 20 MEQ tablet Take 2 tablets (40 mEq total) by mouth daily. 60 tablet 3  . rivaroxaban (XARELTO) 20 MG TABS tablet Take 1 tablet (20 mg total) by mouth daily with supper. 90 tablet 3  . sotalol (BETAPACE) 120 MG tablet Take 1 tablet (120 mg total) by mouth 2 (two) times daily. 60 tablet 3  . spironolactone (ALDACTONE) 25 MG tablet Take 0.5 tablets (12.5 mg total) by mouth daily. (Patient taking differently: Take 12.5 mg by mouth every evening. ) 15 tablet 3  . SYNTHROID 125 MCG tablet Take 125 mcg by mouth every morning.   5  . traZODone (DESYREL) 50 MG tablet Take 0.5 tablets (25 mg total) by mouth at bedtime as needed for sleep. (Patient taking differently: Take 25 mg by mouth at bedtime. ) 30 tablet 0  . triamcinolone (NASACORT ALLERGY 24HR) 55 MCG/ACT AERO nasal inhaler Place 1 spray into the nose daily.      No current facility-administered medications for this visit.    Facility-Administered Medications Ordered in Other Visits  Medication Dose Route Frequency Provider Last Rate Last Dose  . bivalirudin (ANGIOMAX) 250 mg in sodium chloride 0.9 % 50 mL (5  mg/mL) infusion    Continuous PRN Kathleene Hazel, MD   Stopped at 09/20/15 [phone]  . bivalirudin (ANGIOMAX) BOLUS via infusion    PRN Kathleene Hazel, MD   54.45 mg at 09/20/15 9604  . fentaNYL (SUBLIMAZE) injection    PRN Kathleene Hazel, MD   25 mcg at 09/20/15 0651  . heparin 1,500 mL    PRN Verne Carrow D, MD      . heparin infusion 2 units/mL in 0.9 % sodium chloride    Continuous PRN Kathleene Hazel, MD   1,500 mL at 09/20/15 0733  . iopamidol (ISOVUE-370) 76 % injection    PRN Kathleene Hazel, MD   165 mL at 09/20/15 0733  . lidocaine (PF) (XYLOCAINE) 1 % injection    PRN Kathleene Hazel, MD   2 mL at 09/20/15 5409  . midazolam (VERSED) injection    PRN Kathleene Hazel, MD   1 mg at 09/20/15 0651  . nitroGLYCERIN 1 mg/10 ml (  100 mcg/ml) - IR/CATH LAB    PRN Kathleene Hazel, MD   200 mcg at 09/20/15 1610  . Radial Cocktail/Verapamil only    PRN Kathleene Hazel, MD   10 mL at 09/20/15 0629  . ticagrelor (BRILINTA) tablet    PRN Kathleene Hazel, MD   180 mg at 09/20/15 [phone]  . tirofiban (AGGRASTAT) bolus via infusion    PRN Kathleene Hazel, MD   1,815 mcg at 09/20/15 [phone]  . tirofiban (AGGRASTAT) infusion 50 mcg/mL 100 mL    Continuous PRN Kathleene Hazel, MD 13.1 mL/hr at 09/20/15 0651 0.15 mcg/kg/min at 09/20/15 8119    Allergies:   Paxil [paroxetine]; Spironolactone; Morphine and related; Percocet [oxycodone-acetaminophen]; Cefdinir; and Zolpidem   Social History:  The patient  reports that  has never smoked. she has never used smokeless tobacco. She reports that she does not drink alcohol or use drugs.   Family History:  The patient's family history includes Arrhythmia in her mother; Heart attack in her father.   ROS:  Please see the history of present illness.   Otherwise, review of systems is positive for none.   All other systems are reviewed and negative.   PHYSICAL EXAM: VS:   BP (!) 80/60   Pulse (!) 59   Ht 5\' 9"  (1.753 m)   Wt 150 lb 3.2 oz (68.1 kg)   BMI 22.18 kg/m  , BMI Body mass index is 22.18 kg/m. GEN: Well nourished, well developed, in no acute distress  HEENT: normal  Neck: no JVD, carotid bruits, or masses Cardiac: iRRR; no murmurs, rubs, or gallops,no edema  Respiratory:  clear to auscultation bilaterally, normal work of breathing GI: soft, nontender, nondistended, + BS MS: no deformity or atrophy  Skin: warm and dry, device site well healed Neuro:  Strength and sensation are intact Psych: euthymic mood, full affect  EKG:  EKG is not ordered today. Personal review of the ekg ordered 02/26/16 shows sinus rhythm, rate 52  Recent Labs: 02/06/2017: Magnesium 2.1 02/25/2017: BUN 23; Creatinine, Ser 1.12; Hemoglobin 11.8; Platelets 164; Potassium 4.2; Sodium 135    Lipid Panel     Component Value Date/Time   CHOL 110 02/25/2017 1049   TRIG 76 02/25/2017 1049   HDL 51 02/25/2017 1049   CHOLHDL 2.2 02/25/2017 1049   VLDL 15 02/25/2017 1049   LDLCALC 44 02/25/2017 1049     Wt Readings from Last 3 Encounters:  03/24/17 150 lb 3.2 oz (68.1 kg)  02/25/17 152 lb (68.9 kg)  02/20/17 149 lb 12.8 oz (67.9 kg)      Other studies Reviewed: Additional studies/ records that were reviewed today include: TEE 10/27/16 Review of the above records today demonstrates:  - Left ventricle: Moderately dilated left ventricle with akinesis   of the septal and anterior walls. EF about 30%. - Aortic valve: There was no stenosis. - Aorta: Normal caliber aorta with minimal plaque. - Mitral valve: The mitral valve appeared structurally normal with   moderate mitral regurgitation, suspect functional MR. No   pulmonary vein doppler systolic flow reversal noted. - Left atrium: The atrium was mildly dilated. No evidence of   thrombus in the atrial cavity or appendage. - Right ventricle: The cavity size was normal. Systolic function   was normal. - Right atrium:  No evidence of thrombus in the atrial cavity or   appendage. - Atrial septum: No defect or patent foramen ovale was identified.   Echo contrast study showed no  right-to-left atrial level shunt,   at baseline or with provocation. - Tricuspid valve: Peak RV-RA gradient 31 mmHg.  Holter 01/26/17 - personally reviewed Minimum HR: 58 BPM at 4:28:50 AM(2) Maximum HR: 113 BPM at 10:19:38 AM Average HR: 86 BPM Ventricular Beats: 100073 (41%) Sinus rhythm with PVCs and atrial runs  ASSESSMENT AND PLAN:  1.  Coronary artery disease: Chest pain.  Continue current management.  2. Paroxysmal atrial fibrillation: None since hospitalization on device check.  Continue Xarelto.  This patients CHA2DS2-VASc Score and unadjusted Ischemic Stroke Rate (% per year) is equal to 3.2 % stroke rate/year from a score of 3  Above score calculated as 1 point each if present [CHF, HTN, DM, Vascular=MI/PAD/Aortic Plaque, Age if 65-74, or Female] Above score calculated as 2 points each if present [Age > 75, or Stroke/TIA/TE]  3. Ischemic cardiomyopathy: Saint Jude ICD implanted.  On optimal medical therapy.  4.  Moderate mitral regurgitation, nonrheumatic: Likely functional due to ischemic cardiomyopathy.  No changes.  5.  PVCs: 41% burden on her Holter monitor.  Plan for ablation next week.  Risks and benefits discussed.  Risks include bleeding, tamponade, heart block, stroke, among others.  She understands these risks and is agreed to the procedure.   Current medicines are reviewed at length with the patient today.   The patient does not have concerns regarding her medicines.  The following changes were made today: None  Labs/ tests ordered today include:  Orders Placed This Encounter  Procedures  . Basic Metabolic Panel (BMET)  . CBC w/Diff     Disposition:   FU with Kerah Hardebeck 1 months  Signed, Marget Outten Jorja Loa, MD  03/24/2017 12:35 PM     [facility] [address] [place] [address] [phone] (office) [phone] (fax)

## 2017-03-24 NOTE — Patient Instructions (Signed)
Medication Instructions:  Your physician recommends that you continue on your current medications as directed. Please refer to the Current Medication list given to you today.  * If you need a refill on your cardiac medications before your next appointment, please call your pharmacy. *  Labwork: Pre procedure labs today: BMET & CBC w/ diff  Testing/Procedures: Your physician has recommended that you have an ablation. Catheter ablation is a medical procedure used to treat some cardiac arrhythmias (irregular heartbeats). During catheter ablation, a long, thin, flexible tube is put into a blood vessel in your groin (upper thigh), or neck. This tube is called an ablation catheter. It is then guided to your heart through the blood vessel. Radio frequency waves destroy small areas of heart tissue where abnormal heartbeats may cause an arrhythmia to start. Please see the instruction sheet given to you today.  Follow-Up: Keep your scheduled follow up on 05/11/2017 with Dr. Elberta Fortis   Thank you for choosing [facility]!!   Dory Horn, RN [phone]

## 2017-03-24 NOTE — Telephone Encounter (Signed)
Catherine Mays is calling about a bill in which she was told that Dr. Clifton James needs to send a corrected claim . Please call   Thanks

## 2017-03-25 LAB — CBC WITH DIFFERENTIAL/PLATELET
Basophils Absolute: 0 x10E3/uL (ref 0.0–0.2)
Basos: 1 %
EOS (ABSOLUTE): 0.2 x10E3/uL (ref 0.0–0.4)
Eos: 3 %
Hematocrit: 36.4 % (ref 34.0–46.6)
Hemoglobin: 11.8 g/dL (ref 11.1–15.9)
Immature Grans (Abs): 0 x10E3/uL (ref 0.0–0.1)
Immature Granulocytes: 0 %
Lymphocytes Absolute: 2 x10E3/uL (ref 0.7–3.1)
Lymphs: 33 %
MCH: 29.9 pg (ref 26.6–33.0)
MCHC: 32.4 g/dL (ref 31.5–35.7)
MCV: 92 fL (ref 79–97)
Monocytes Absolute: 0.7 x10E3/uL (ref 0.1–0.9)
Monocytes: 11 %
Neutrophils Absolute: 3.3 x10E3/uL (ref 1.4–7.0)
Neutrophils: 52 %
Platelets: 171 x10E3/uL (ref 150–379)
RBC: 3.95 x10E6/uL (ref 3.77–5.28)
RDW: 13.8 % (ref 12.3–15.4)
WBC: 6.2 x10E3/uL (ref 3.4–10.8)

## 2017-03-25 LAB — BASIC METABOLIC PANEL WITH GFR
BUN/Creatinine Ratio: 18 (ref 9–23)
BUN: 21 mg/dL (ref 6–24)
CO2: 26 mmol/L (ref 20–29)
Calcium: 8.9 mg/dL (ref 8.7–10.2)
Chloride: 100 mmol/L (ref 96–106)
Creatinine, Ser: 1.14 mg/dL — ABNORMAL HIGH (ref 0.57–1.00)
GFR calc Af Amer: 64 mL/min/1.73
GFR calc non Af Amer: 56 mL/min/1.73 — ABNORMAL LOW
Glucose: 89 mg/dL (ref 65–99)
Potassium: 4.4 mmol/L (ref 3.5–5.2)
Sodium: 141 mmol/L (ref 134–144)

## 2017-03-25 NOTE — Telephone Encounter (Signed)
Patient presented to office & talked to billing staff.  Current resolution in place.  See account notes for further detail.

## 2017-04-02 ENCOUNTER — Ambulatory Visit (HOSPITAL_COMMUNITY)
Admission: RE | Admit: 2017-04-02 | Discharge: 2017-04-03 | Disposition: A | Discharge status: Home / Self Care | Payer: BLUE CROSS/BLUE SHIELD | Source: Ambulatory Visit | Attending: Cardiology | Admitting: Cardiology

## 2017-04-02 ENCOUNTER — Encounter (HOSPITAL_COMMUNITY): Payer: Self-pay | Admitting: Certified Registered"

## 2017-04-02 ENCOUNTER — Ambulatory Visit (HOSPITAL_COMMUNITY)
Admission: RE | Disposition: A | Discharge status: Home / Self Care | Payer: Self-pay | Source: Ambulatory Visit | Attending: Cardiology

## 2017-04-02 ENCOUNTER — Ambulatory Visit (HOSPITAL_COMMUNITY): Payer: BLUE CROSS/BLUE SHIELD | Admitting: Certified Registered"

## 2017-04-02 DIAGNOSIS — I2510 Atherosclerotic heart disease of native coronary artery without angina pectoris: Secondary | ICD-10-CM | POA: Insufficient documentation

## 2017-04-02 DIAGNOSIS — I493 Ventricular premature depolarization: Secondary | ICD-10-CM | POA: Diagnosis present

## 2017-04-02 DIAGNOSIS — I255 Ischemic cardiomyopathy: Secondary | ICD-10-CM | POA: Insufficient documentation

## 2017-04-02 DIAGNOSIS — I480 Paroxysmal atrial fibrillation: Secondary | ICD-10-CM | POA: Diagnosis not present

## 2017-04-02 DIAGNOSIS — Z7901 Long term (current) use of anticoagulants: Secondary | ICD-10-CM | POA: Diagnosis not present

## 2017-04-02 DIAGNOSIS — E039 Hypothyroidism, unspecified: Secondary | ICD-10-CM | POA: Diagnosis not present

## 2017-04-02 DIAGNOSIS — Z885 Allergy status to narcotic agent status: Secondary | ICD-10-CM | POA: Insufficient documentation

## 2017-04-02 DIAGNOSIS — I509 Heart failure, unspecified: Secondary | ICD-10-CM | POA: Insufficient documentation

## 2017-04-02 HISTORY — PX: PVC ABLATION: EP1236

## 2017-04-02 LAB — POCT ACTIVATED CLOTTING TIME
Activated Clotting Time: 274 seconds
Activated Clotting Time: 335 seconds
Activated Clotting Time: 301 seconds
Activated Clotting Time: 307 seconds
Activated Clotting Time: 164 seconds
Activated Clotting Time: 175 seconds

## 2017-04-02 SURGERY — PVC ABLATION
Anesthesia: Monitor Anesthesia Care

## 2017-04-02 MED ORDER — LORATADINE 10 MG PO TABS
10.0000 mg | ORAL_TABLET | Freq: Every day | ORAL | Status: DC
  Administered 2017-04-02 – 2017-04-03 (×2): 10 mg via ORAL
  Filled 2017-04-02 (×2): qty 1

## 2017-04-02 MED ORDER — HEPARIN (PORCINE) IN NACL 2-0.9 UNIT/ML-% IJ SOLN
INTRAMUSCULAR | Status: AC
  Filled 2017-04-02: qty 500

## 2017-04-02 MED ORDER — SODIUM CHLORIDE 0.9% FLUSH
3.0000 mL | Freq: Two times a day (BID) | INTRAVENOUS | Status: DC
  Administered 2017-04-02 – 2017-04-03 (×3): 3 mL via INTRAVENOUS

## 2017-04-02 MED ORDER — FENTANYL CITRATE (PF) 100 MCG/2ML IJ SOLN
25.0000 ug | INTRAMUSCULAR | Status: DC | PRN
  Administered 2017-04-02: 25 ug via INTRAVENOUS

## 2017-04-02 MED ORDER — PHENYLEPHRINE HCL 10 MG/ML IJ SOLN
INTRAVENOUS | Status: DC | PRN
  Administered 2017-04-02: 40 ug/min via INTRAVENOUS

## 2017-04-02 MED ORDER — ONDANSETRON HCL 4 MG/2ML IJ SOLN
INTRAMUSCULAR | Status: DC | PRN
  Administered 2017-04-02: 4 mg via INTRAVENOUS

## 2017-04-02 MED ORDER — IBUPROFEN 200 MG PO TABS: 400.0000 mg | ORAL_TABLET | Freq: Three times a day (TID) | ORAL | Status: DC | PRN

## 2017-04-02 MED ORDER — FENTANYL CITRATE (PF) 100 MCG/2ML IJ SOLN
INTRAMUSCULAR | Status: AC
  Filled 2017-04-02: qty 2

## 2017-04-02 MED ORDER — ACETAMINOPHEN 325 MG PO TABS
650.0000 mg | ORAL_TABLET | ORAL | Status: DC | PRN
  Administered 2017-04-02: 650 mg via ORAL
  Filled 2017-04-02: qty 2

## 2017-04-02 MED ORDER — DEXAMETHASONE SODIUM PHOSPHATE 10 MG/ML IJ SOLN
INTRAMUSCULAR | Status: DC | PRN
  Administered 2017-04-02: 10 mg via INTRAVENOUS

## 2017-04-02 MED ORDER — SODIUM CHLORIDE 0.9% FLUSH: 3.0000 mL | INTRAVENOUS | Status: DC | PRN

## 2017-04-02 MED ORDER — TRIAMCINOLONE ACETONIDE 55 MCG/ACT NA AERO
1.0000 | INHALATION_SPRAY | Freq: Every day | NASAL | Status: DC
  Administered 2017-04-02 – 2017-04-03 (×2): 1 via NASAL
  Filled 2017-04-02 (×2): qty 10.8

## 2017-04-02 MED ORDER — PROPOFOL 500 MG/50ML IV EMUL
INTRAVENOUS | Status: DC | PRN
  Administered 2017-04-02: 100 ug/kg/min via INTRAVENOUS

## 2017-04-02 MED ORDER — PANTOPRAZOLE SODIUM 40 MG PO TBEC
40.0000 mg | DELAYED_RELEASE_TABLET | Freq: Every day | ORAL | Status: DC
  Administered 2017-04-02 – 2017-04-03 (×2): 40 mg via ORAL
  Filled 2017-04-02 (×2): qty 1

## 2017-04-02 MED ORDER — FUROSEMIDE 20 MG PO TABS
20.0000 mg | ORAL_TABLET | Freq: Every day | ORAL | Status: DC
  Administered 2017-04-02 – 2017-04-03 (×2): 20 mg via ORAL
  Filled 2017-04-02 (×2): qty 1

## 2017-04-02 MED ORDER — LIDOCAINE 2% (20 MG/ML) 5 ML SYRINGE
INTRAMUSCULAR | Status: DC | PRN
  Administered 2017-04-02: 40 mg via INTRAVENOUS

## 2017-04-02 MED ORDER — RIVAROXABAN 20 MG PO TABS
20.0000 mg | ORAL_TABLET | Freq: Every day | ORAL | Status: DC
  Administered 2017-04-02: 20 mg via ORAL
  Filled 2017-04-02: qty 1

## 2017-04-02 MED ORDER — LOSARTAN POTASSIUM 25 MG PO TABS
12.5000 mg | ORAL_TABLET | Freq: Every day | ORAL | Status: DC
  Administered 2017-04-02: 12.5 mg via ORAL
  Filled 2017-04-02: qty 1

## 2017-04-02 MED ORDER — COQ10 200 MG PO CAPS: 200.0000 mg | ORAL_CAPSULE | Freq: Every day | ORAL | Status: DC

## 2017-04-02 MED ORDER — TRAZODONE HCL 50 MG PO TABS
25.0000 mg | ORAL_TABLET | Freq: Every day | ORAL | Status: DC
  Administered 2017-04-02: 25 mg via ORAL
  Filled 2017-04-02: qty 1

## 2017-04-02 MED ORDER — LACTATED RINGERS IV SOLN
INTRAVENOUS | Status: DC | PRN
  Administered 2017-04-02: 07:00:00 via INTRAVENOUS

## 2017-04-02 MED ORDER — LEVOTHYROXINE SODIUM 25 MCG PO TABS
125.0000 ug | ORAL_TABLET | Freq: Every day | ORAL | Status: DC
  Administered 2017-04-03: 125 ug via ORAL
  Filled 2017-04-02: qty 1

## 2017-04-02 MED ORDER — MIDAZOLAM HCL 5 MG/5ML IJ SOLN
INTRAMUSCULAR | Status: DC | PRN
  Administered 2017-04-02: 1 mg via INTRAVENOUS

## 2017-04-02 MED ORDER — HEPARIN SODIUM (PORCINE) 1000 UNIT/ML IJ SOLN
INTRAMUSCULAR | Status: DC | PRN
  Administered 2017-04-02: 1000 [IU] via INTRAVENOUS

## 2017-04-02 MED ORDER — BUPIVACAINE HCL (PF) 0.25 % IJ SOLN
INTRAMUSCULAR | Status: DC | PRN
  Administered 2017-04-02: 20 mL

## 2017-04-02 MED ORDER — HEPARIN SODIUM (PORCINE) 1000 UNIT/ML IJ SOLN
INTRAMUSCULAR | Status: DC | PRN
  Administered 2017-04-02: 2000 [IU] via INTRAVENOUS
  Administered 2017-04-02: 4000 [IU] via INTRAVENOUS
  Administered 2017-04-02: 11000 [IU] via INTRAVENOUS
  Administered 2017-04-02: 2000 [IU] via INTRAVENOUS

## 2017-04-02 MED ORDER — FENTANYL CITRATE (PF) 100 MCG/2ML IJ SOLN
INTRAMUSCULAR | Status: DC | PRN
  Administered 2017-04-02 (×2): 50 ug via INTRAVENOUS

## 2017-04-02 MED ORDER — SODIUM CHLORIDE 0.9 % IV SOLN: 250.0000 mL | INTRAVENOUS | Status: DC | PRN

## 2017-04-02 MED ORDER — PROTAMINE SULFATE 10 MG/ML IV SOLN
INTRAVENOUS | Status: DC | PRN
  Administered 2017-04-02: 40 mg via INTRAVENOUS

## 2017-04-02 MED ORDER — ONDANSETRON HCL 4 MG/2ML IJ SOLN: 4.0000 mg | Freq: Four times a day (QID) | INTRAMUSCULAR | Status: DC | PRN

## 2017-04-02 MED ORDER — HEPARIN SODIUM (PORCINE) 1000 UNIT/ML IJ SOLN
INTRAMUSCULAR | Status: AC
  Filled 2017-04-02: qty 1

## 2017-04-02 MED ORDER — PROPOFOL 10 MG/ML IV BOLUS
INTRAVENOUS | Status: DC | PRN
  Administered 2017-04-02 (×2): 20 mg via INTRAVENOUS

## 2017-04-02 MED ORDER — POTASSIUM CHLORIDE CRYS ER 20 MEQ PO TBCR
40.0000 meq | EXTENDED_RELEASE_TABLET | Freq: Every day | ORAL | Status: DC
  Administered 2017-04-03: 40 meq via ORAL
  Filled 2017-04-02: qty 2

## 2017-04-02 MED ORDER — SOTALOL HCL 120 MG PO TABS
120.0000 mg | ORAL_TABLET | Freq: Two times a day (BID) | ORAL | Status: DC
  Administered 2017-04-02 – 2017-04-03 (×2): 120 mg via ORAL
  Filled 2017-04-02 (×2): qty 1

## 2017-04-02 MED ORDER — BUPIVACAINE HCL (PF) 0.25 % IJ SOLN
INTRAMUSCULAR | Status: AC
  Filled 2017-04-02: qty 30

## 2017-04-02 SURGICAL SUPPLY — 19 items
BAG SNAP BAND KOVER 36X36 (MISCELLANEOUS) ×2 IMPLANT
BLANKET WARM UNDERBOD FULL ACC (MISCELLANEOUS) ×2 IMPLANT
CATH JOSEPHSON QUAD-ALLRED 6FR (CATHETERS) ×2 IMPLANT
CATH MAPPNG PENTARAY F 2-6-2MM (CATHETERS) ×1 IMPLANT
CATH SMTCH THERMOCOOL SF DF (CATHETERS) ×2 IMPLANT
CATH SOUNDSTAR 3D IMAGING (CATHETERS) ×2 IMPLANT
INTRO AGILIS NXT STEERABLE LRG (INTRODUCER) ×2
INTRODUCER AGILS NXT STRBL LRG (INTRODUCER) ×1 IMPLANT
PACK EP LATEX FREE (CUSTOM PROCEDURE TRAY) ×1
PACK EP LF (CUSTOM PROCEDURE TRAY) ×1 IMPLANT
PAD DEFIB LIFELINK (PAD) ×2 IMPLANT
PATCH CARTO3 (PAD) ×2 IMPLANT
PENTARAY F 2-6-2MM (CATHETERS) ×2
SHEATH AVANTI 11F 11CM (SHEATH) ×2 IMPLANT
SHEATH BAYLIS TRANSSEPTAL 98CM (NEEDLE) ×2 IMPLANT
SHEATH PINNACLE 6F 10CM (SHEATH) ×2 IMPLANT
SHEATH PINNACLE 8F 10CM (SHEATH) ×2 IMPLANT
SHEATH PINNACLE 9F 10CM (SHEATH) ×2 IMPLANT
TUBING SMART ABLATE COOLFLOW (TUBING) ×2 IMPLANT

## 2017-04-02 NOTE — Progress Notes (Signed)
MD notified of 7-8/10 right groin and lower back pain. 25 mcg fentanyl ordered and MD visualized patient.

## 2017-04-02 NOTE — Progress Notes (Signed)
Site area: RFV x1 and LFV x2 Site Prior to Removal:  Level 0 Pressure Applied For: 20 minutes Manual:   yes Patient Status During Pull:  stable Post Pull Site:  Level 0 Post Pull Instructions Given: yes  Post Pull Pulses Present: palpable Dressing Applied:  tegaderm Bedrest begins @ 1250 Comments:

## 2017-04-02 NOTE — Anesthesia Preprocedure Evaluation (Addendum)
Anesthesia Evaluation  Patient identified by MRN, date of birth, ID band Patient awake    Reviewed: Allergy & Precautions, NPO status , Patient's Chart, lab work & pertinent test results  Airway Mallampati: I  TM Distance: >3 FB Neck ROM: Full    Dental no notable dental hx. (+) Teeth Intact, Dental Advisory Given   Pulmonary neg pulmonary ROS,    Pulmonary exam normal breath sounds clear to auscultation       Cardiovascular + CAD  Normal cardiovascular exam+ pacemaker + Cardiac Defibrillator  Rhythm:Regular Rate:Normal     Neuro/Psych negative neurological ROS  negative psych ROS   GI/Hepatic negative GI ROS, Neg liver ROS,   Endo/Other  Hypothyroidism   Renal/GU negative Renal ROS  negative genitourinary   Musculoskeletal negative musculoskeletal ROS (+)   Abdominal Normal abdominal exam  (+)   Peds  Hematology negative hematology ROS (+)   Anesthesia Other Findings Otha Rickles  CUP PACEART [facility] DEVICE CHECK  Order# [id number]  Ordering physician: Constance Haw, MD Study date: 01/20/2017 Indications  Karl Ito  ECHO TEE (ENDO) W COLOR SPECT AND 3D  Order# [id number]  Reading physician: Larey Dresser, MD Ordering physician: Larey Dresser, MD Study date: 10/08/16 Study Result   Result status: Final result                             *[facility]*                         [address], [place] [address]                            [phone]  ------------------------------------------------------------------- Transesophageal Echocardiography  Patient:    Catherine Mays, Catherine Mays MR #:       [id number] Study Date: 10/08/2016 Gender:     F Age:        52 Height:     175.3 cm Weight:     63.5 kg BSA:        1.75 m^2 Pt. Status: Room:   ADMITTING    Loralie Champagne, M.D.  ATTENDING    Loralie Champagne,  M.D.  ORDERING     Loralie Champagne, M.D.  PERFORMING   Loralie Champagne, M.D.  REFERRING    Loralie Champagne, M.D.  SONOGRAPHER  Donata Clay  cc:  -------------------------------------------------------------------  ------------------------------------------------------------------- Indications:      424.0 Mitral valve disease.  ------------------------------------------------------------------- History:   PMH:   Atrial fibrillation.  Coronary artery disease. Congestive heart failure.  PMH:   Myocardial infarction.  Risk factors:  Dyslipidemia.  ------------------------------------------------------------------- Study Conclusions  - Left ventricle: Moderately dilated left ventricle with akinesis   of the septal and anterior walls. EF about 30%. - Aortic valve: There was no stenosis. - Aorta: Normal caliber aorta with minimal plaque. - Mitral valve: The mitral valve appeared structurally normal with   moderate mitral regurgitation, suspect functional MR. No   pulmonary vein doppler systolic flow reversal noted. - Left atrium: The atrium was mildly dilated. No evidence of   thrombus in the atrial cavity or appendage. - Right  ventricle: The cavity size was normal. Systolic function   was normal. - Right atrium: No evidence of thrombus in the atrial cavity or   appendage. - Atrial septum: No defect or patent foramen ovale was identified.   Echo contrast study showed no right-to-left atrial level shunt,   at baseline or with provocation. - Tricuspid    Ischemic cardiomyopathy (I25.5 (ICD-10-CM)) Conclusion   battery and lead measurements stable no episodes cyber security upgrade completed [employer] collection turned Barkley Boards, PA-C    Reproductive/Obstetrics                         Anesthesia Physical Anesthesia Plan  ASA: III  Anesthesia Plan: MAC   Post-op Pain Management:    Induction: Intravenous  PONV Risk Score and Plan: 3 and  Ondansetron, Dexamethasone, Propofol infusion and Treatment may vary due to age or medical condition  Airway Management Planned: Nasal Cannula  Additional Equipment: None  Intra-op Plan:   Post-operative Plan:   Informed Consent: I have reviewed the patients History and Physical, chart, labs and discussed the procedure including the risks, benefits and alternatives for the proposed anesthesia with the patient or authorized representative who has indicated his/her understanding and acceptance.   Dental advisory given  Plan Discussed with: CRNA, Anesthesiologist and Surgeon  Anesthesia Plan Comments:       Anesthesia Quick Evaluation

## 2017-04-02 NOTE — H&P (Signed)
Catherine Mays has presented today for surgery, with the diagnosis of PVCs, CHF.  The various methods of treatment have been discussed with the patient and family. After consideration of risks, benefits and other options for treatment, the patient has consented to  Procedure(s): PVC ablation as a surgical intervention .  Risks include but not limited to bleeding, tamponade, heart block, stroke, damage to surrounding organs, among others. The patient's history has been reviewed, patient examined, no change in status, stable for surgery.  I have reviewed the patient's chart and labs.  Questions were answered to the patient's satisfaction.    Jonah Gingras Elberta Fortis, MD 04/02/2017 7:11 AM

## 2017-04-02 NOTE — Progress Notes (Signed)
Received patient alert. RSR pvc on monitor. 6 fr sheath rfv pt. C/o pain currently rates "6/10" down from 7 after medication administration. Right sided back pain down from "8-7". Per Paul,RN Dr. Elberta Fortis aware. Act 175- wating 10 min to removed RFV sheath. 8 fr and 11 fr sheath at RFV soft- pt. Denies pain level0. Bilateral dp palpable. SR up x2 bed in loweet position, call bell placed.

## 2017-04-02 NOTE — Transfer of Care (Signed)
Immediate Anesthesia Transfer of Care Note  Patient: Catherine Mays  Procedure(s) Performed: PVC ABLATION (N/A )  Patient Location: Cath Lab  Anesthesia Type:MAC  Level of Consciousness: drowsy and patient cooperative  Airway & Oxygen Therapy: Patient Spontanous Breathing and Patient connected to nasal cannula oxygen  Post-op Assessment: Report given to RN, Post -op Vital signs reviewed and stable and Patient moving all extremities  Post vital signs: Reviewed and stable  Last Vitals:  Vitals:   04/02/17 0553  BP: 105/73  Pulse: 63  Temp: 36.5 C  SpO2: 100%    Last Pain:  Vitals:   04/02/17 0553  TempSrc: Oral      Patients Stated Pain Goal: 3 (16/10/96 0454)  Complications: No apparent anesthesia complications

## 2017-04-02 NOTE — Anesthesia Procedure Notes (Signed)
Procedure Name: MAC Date/Time: 04/02/2017 7:45 AM Performed by: Moshe Salisbury, CRNA Pre-anesthesia Checklist: Patient identified, Emergency Drugs available, Suction available, Patient being monitored and Timeout performed Patient Re-evaluated:Patient Re-evaluated prior to induction Oxygen Delivery Method: Nasal cannula Ventilation: Nasal airway inserted- appropriate to patient size Placement Confirmation: positive ETCO2 Dental Injury: Teeth and Oropharynx as per pre-operative assessment

## 2017-04-02 NOTE — Discharge Summary (Signed)
ELECTROPHYSIOLOGY PROCEDURE DISCHARGE SUMMARY    Patient ID: Catherine Mays,  MRN: [id number], DOB/AGE: 52-Jul-1967 52 y.o.  Admit date: 04/02/2017 Discharge date: 04/03/17  Primary Care Physician: Benita Stabile, MD  Primary Cardiologist: Dr. Clifton James Electrophysiologist: Dr. Elberta Fortis  Primary Discharge Diagnosis:  1. PVCs  Secondary Discharge Diagnosis:  1. CAD 2. ICM 3. Chronic CHF 4. Paroxysmal AFib     CHA2DS2Vasc is 3, on Xarelto   Allergies  Allergen Reactions  . Paxil [Paroxetine] Palpitations  . Spironolactone Other (See Comments)    Kidney problems  . Morphine And Related Nausea And Vomiting  . Percocet [Oxycodone-Acetaminophen] Nausea And Vomiting  . Cefdinir Nausea Only  . Zolpidem Other (See Comments)    Doesn't work for patient at all     Procedures This Admission: 1.  Electrophysiology study and radiofrequency catheter ablation on 04/02/17 by Dr Elberta Fortis.  This study demonstrated  CONCLUSIONS: 1. Sinus rhythm upon presentation.   2. Successful ablation of PVCs from the posterior medial papillary muscle  3. No inducible arrhythmias following ablation  4. No early apparent complications.   Brief HPI: Catherine Mays is a 52 y.o. female with a past medical history as outlined above.  Her PVC burden quite high at 41%, recommended to undergo PVC ablation.  Risks, benefits, and alternatives to ablation were reviewed with the patient who wished to proceed.   Hospital Course:  The patient was admitted and underwent EPS/RFCA with details as outlined above. She was monitored on telemetry, unfortunately her PVCs returned, though maintained SR.  B/L groin sites were without complication.  The patient was examined by Dr. Elberta Fortis and considered stable for discharge to home.  Follow up Catherine Mays be arranged in 4 weeks.  Wound care and restrictions were reviewed with the patient prior to discharge.   Patients BP this 89SBP,.  The patient feels well, has ambulated and reports  her baseline BP is typically like this, "right around 90" she feels well, tolerating her medicines, has ambulate without difficulty or symptoms.  Physical Exam: Vitals:   04/02/17 1430 04/02/17 1553 04/02/17 2148 04/03/17 0607  BP: 99/67 99/64 (!) 76/39 (!) 89/50  Pulse: 74 71 77 74  Resp: 19 15 18  (!) 25  Temp: 99 F (37.2 C) 98.6 F (37 C) 97.8 F (36.6 C) 98.1 F (36.7 C)  TempSrc: Oral Oral Oral Oral  SpO2: 99% 98% 99% 98%  Weight:    149 lb 9.6 oz (67.9 kg)  Height:        GEN- The patient is well appearing, alert and oriented x 3 today.   HEENT: normocephalic, atraumatic; sclera clear, conjunctiva pink; hearing intact; oropharynx clear; neck supple, no JVP Lymph- no cervical lymphadenopathy Lungs- CTA, normal work of breathing.  No wheezes, rales, rhonchi Heart-  RRR, no murmurs, rubs or gallops, PMI not laterally displaced GI- soft, non-tender, non-distended Extremities- no clubbing, cyanosis, or edema; DP/PT/radial pulses 2+ bilaterally, b/l groins without hematoma/bruit MS- no significant deformity or atrophy Skin- warm and dry, no rash or lesion Psych- euthymic mood, full affect Neuro- strength and sensation are intact   Discharge Vitals:  BP (!) 89/50 (BP Location: Left Arm)   Pulse 74   Temp 98.1 F (36.7 C) (Oral)   Resp (!) 25   Ht 5\' 9"  (1.753 m)   Wt 149 lb 9.6 oz (67.9 kg)   SpO2 98%   BMI 22.09 kg/m    Labs:   Lab Results  Component Value Date  WBC 6.2 03/24/2017   HGB 11.8 03/24/2017   HCT 36.4 03/24/2017   MCV 92 03/24/2017   PLT 171 03/24/2017   No results for input(s): NA, K, CL, CO2, BUN, CREATININE, CALCIUM, PROT, BILITOT, ALKPHOS, ALT, AST, GLUCOSE in the last 168 hours.  Invalid input(s): LABALBU  Discharge Medications:  Allergies as of 04/03/2017      Reactions   Paxil [paroxetine] Palpitations   Spironolactone Other (See Comments)   Kidney problems   Morphine And Related Nausea And Vomiting   Percocet [oxycodone-acetaminophen]  Nausea And Vomiting   Cefdinir Nausea Only   Zolpidem Other (See Comments)   Doesn't work for patient at all      Medication List    TAKE these medications   CALCIUM + D3 PO Take 1 tablet by mouth every morning.   CoQ10 200 MG Caps Take 200 mg by mouth daily. What changed:  Another medication with the same name was removed. Continue taking this medication, and follow the directions you see here.   fexofenadine 180 MG tablet Commonly known as:  ALLEGRA Take 180 mg by mouth daily.   furosemide 20 MG tablet Commonly known as:  LASIX Take 2 tablets (40 mg total) by mouth daily. What changed:  how much to take   ibuprofen 200 MG tablet Commonly known as:  ADVIL,MOTRIN Take 400 mg by mouth every 8 (eight) hours as needed for mild pain.   losartan 25 MG tablet Commonly known as:  COZAAR Take 0.5 tablets (12.5 mg total) at bedtime by mouth.   NASACORT ALLERGY 24HR 55 MCG/ACT Aero nasal inhaler Generic drug:  triamcinolone Place 1 spray into the nose daily.   nitroGLYCERIN 0.4 MG SL tablet Commonly known as:  NITROSTAT Place 1 tablet (0.4 mg total) under the tongue every 5 (five) minutes as needed for chest pain. What changed:  additional instructions   pantoprazole 40 MG tablet Commonly known as:  PROTONIX Take 1 tablet (40 mg total) by mouth daily.   potassium chloride SA 20 MEQ tablet Commonly known as:  K-DUR,KLOR-CON Take 2 tablets (40 mEq total) by mouth daily.   rivaroxaban 20 MG Tabs tablet Commonly known as:  XARELTO Take 1 tablet (20 mg total) by mouth daily with supper.   sotalol 120 MG tablet Commonly known as:  BETAPACE Take 1 tablet (120 mg total) by mouth 2 (two) times daily.   spironolactone 25 MG tablet Commonly known as:  ALDACTONE Take 0.5 tablets (12.5 mg total) by mouth daily. What changed:  when to take this   SYNTHROID 125 MCG tablet Generic drug:  levothyroxine Take 125 mcg by mouth every morning.   traZODone 50 MG tablet Commonly  known as:  DESYREL Take 0.5 tablets (25 mg total) by mouth at bedtime as needed for sleep. What changed:  when to take this       Disposition:  Home  Discharge Instructions    Diet - low sodium heart healthy   Complete by:  As directed    Increase activity slowly   Complete by:  As directed      Follow-up Information    Regan Lemming, MD Follow up on 05/11/2017.   Specialty:  Cardiology Why:  9:45AM Contact information: [address] [place] [address] [phone]           Duration of Discharge Encounter: Greater than 30 minutes including physician time.  SignedFrancis Dowse, PA-C 04/03/2017 9:56 AM  I have seen and examined  this patient with Francis Dowse.  Agree with above, note added to reflect my findings.  On exam, iRRR, no murmurs, lungs clear. PVC ablation on posterior medial papillary muscle. Resumption of PVCs after the procedure. Plan for discharge on sotalol. May require further antiarrhythmics as outpatient.    Stillman Buenger M. Maycie Luera MD 04/03/2017 7:03 PM

## 2017-04-02 NOTE — Discharge Instructions (Signed)
Post procedure care instructions No driving for days. No lifting over 5 lbs for 1 week. No vigorousorsexual activity for 1 week. You may return to work in 1 week. Keep procedure site clean & dry. If you notice increased pain, swelling, bleeding or pus, call/return!  You may shower, but no soaking baths/hot tubs/pools for 1 week.

## 2017-04-02 NOTE — Anesthesia Postprocedure Evaluation (Signed)
Anesthesia Post Note  Patient: Catherine Mays  Procedure(s) Performed: PVC ABLATION (N/A )     Patient location during evaluation: Cath Lab Anesthesia Type: MAC Level of consciousness: awake Pain management: pain level controlled Vital Signs Assessment: post-procedure vital signs reviewed and stable Respiratory status: spontaneous breathing Cardiovascular status: stable Postop Assessment: no apparent nausea or vomiting Anesthetic complications: no    Last Vitals:  Vitals:   04/02/17 1145 04/02/17 1150  BP: (!) 127/103 101/74  Pulse: 67 65  Resp: 14 15  Temp:  (!) 36.4 C  SpO2: 100% 100%    Last Pain:  Vitals:   04/02/17 1150  TempSrc: Temporal  PainSc:    Pain Goal: Patients Stated Pain Goal: 3 (04/02/17 0622)               [name]

## 2017-04-03 ENCOUNTER — Other Ambulatory Visit: Payer: Self-pay

## 2017-04-03 ENCOUNTER — Encounter (HOSPITAL_COMMUNITY): Payer: Self-pay | Admitting: Cardiology

## 2017-04-03 DIAGNOSIS — I2510 Atherosclerotic heart disease of native coronary artery without angina pectoris: Secondary | ICD-10-CM | POA: Diagnosis not present

## 2017-04-03 DIAGNOSIS — I509 Heart failure, unspecified: Secondary | ICD-10-CM | POA: Diagnosis not present

## 2017-04-03 DIAGNOSIS — I493 Ventricular premature depolarization: Secondary | ICD-10-CM | POA: Diagnosis not present

## 2017-04-03 DIAGNOSIS — I480 Paroxysmal atrial fibrillation: Secondary | ICD-10-CM | POA: Diagnosis not present

## 2017-04-03 MED FILL — Heparin Sodium (Porcine) 2 Unit/ML in Sodium Chloride 0.9%: INTRAMUSCULAR | Qty: 1500 | Status: AC

## 2017-04-03 NOTE — Plan of Care (Signed)
Pt up ad lib. Ambulates independently with ease. Educated on use of call light system. Verbalizes understanding of need to call for assistance prior to ambulation when necessary. 

## 2017-04-03 NOTE — Progress Notes (Signed)
   04/03/17 1100  Clinical Encounter Type  Visited With Patient and family together  Visit Type Initial  Referral From Chaplain  Consult/Referral To Chaplain  Spiritual Encounters  Spiritual Needs Emotional  Stress Factors  Patient Stress Factors None identified  Family Stress Factors None identified    This was a pastoral care visit. Patient was laying in her bed looking forward to discharge. Husband on-site. Both pt and husband were very receptive and appreciative of chaplain's visit. I provided emotional support, reflective listening and compassionate presence.  Caliana Spires a Water quality scientist, [employer]

## 2017-04-06 ENCOUNTER — Other Ambulatory Visit (HOSPITAL_COMMUNITY): Payer: BLUE CROSS/BLUE SHIELD

## 2017-04-20 ENCOUNTER — Encounter: Payer: BLUE CROSS/BLUE SHIELD | Admitting: Cardiology

## 2017-05-01 ENCOUNTER — Other Ambulatory Visit (HOSPITAL_COMMUNITY): Payer: Self-pay | Admitting: Internal Medicine

## 2017-05-08 ENCOUNTER — Other Ambulatory Visit (HOSPITAL_COMMUNITY): Payer: Self-pay | Admitting: *Deleted

## 2017-05-08 MED ORDER — POTASSIUM CHLORIDE CRYS ER 20 MEQ PO TBCR: 40.0000 meq | EXTENDED_RELEASE_TABLET | Freq: Every day | ORAL | 3 refills | Status: DC

## 2017-05-11 ENCOUNTER — Ambulatory Visit: Payer: BLUE CROSS/BLUE SHIELD | Admitting: Cardiology

## 2017-05-11 ENCOUNTER — Encounter: Payer: Self-pay | Admitting: Cardiology

## 2017-05-11 VITALS — BP 90/70 | HR 77 | Ht 69.0 in | Wt 143.2 lb

## 2017-05-11 DIAGNOSIS — I255 Ischemic cardiomyopathy: Secondary | ICD-10-CM | POA: Diagnosis not present

## 2017-05-11 DIAGNOSIS — I2510 Atherosclerotic heart disease of native coronary artery without angina pectoris: Secondary | ICD-10-CM

## 2017-05-11 DIAGNOSIS — Z9889 Other specified postprocedural states: Secondary | ICD-10-CM | POA: Diagnosis not present

## 2017-05-11 DIAGNOSIS — I493 Ventricular premature depolarization: Secondary | ICD-10-CM

## 2017-05-11 DIAGNOSIS — Z8679 Personal history of other diseases of the circulatory system: Secondary | ICD-10-CM

## 2017-05-11 MED ORDER — SOTALOL HCL 160 MG PO TABS: 160.0000 mg | ORAL_TABLET | Freq: Two times a day (BID) | ORAL | 3 refills | Status: DC

## 2017-05-11 NOTE — Patient Instructions (Addendum)
Medication Instructions:  Your physician has recommended you make the following change in your medication: 1. INCREASE Sotalol to 160 mg twice a day  Labwork: None ordered  Testing/Procedures: None ordered  Follow-Up: Your physician recommends that you schedule a follow-up appointment in: 3 months with Dr. Elberta Fortis.  * If you need a refill on your cardiac medications before your next appointment, please call your pharmacy.   *Please note that any paperwork needing to be filled out by the provider will need to be addressed at the front desk prior to seeing the provider. Please note that any FMLA, disability or other documents regarding health condition is subject to a $25.00 charge that must be received prior to completion of paperwork in the form of a money order or check.  Thank you for choosing [facility]!!   Dory Horn, RN [phone]

## 2017-05-11 NOTE — Progress Notes (Signed)
Electrophysiology Office Note   Date:  05/11/2017   ID:  Catherine Mays, DOB [birth year], MRN [id number]  PCP:  Benita Stabile, MD  Cardiologist:  Janett Labella Primary Electrophysiologist:  Regan Lemming, MD    Chief Complaint  Patient presents with  . Defib Check    PVC's/PAF     History of Present Illness: Catherine Mays is a 52 y.o. female who presents today for electrophysiology evaluation.   History of CAD (STEMI 08/2015 s/p PTCA/DES to mid LAD, mild residual mRCA), ischemic cardiomyopathy, chronic systolic CHF, paroxysmal atrial fib (dx at time of STEMI), PVCs, hypotension. She had presented to the [facility] ED with chest pain that night and was sent home. She came back in later that night with continued chest pain and EKG showed ST elevation c/w an anterior MI. Emergent transport to [facility] and cardiac cath with occluded mid LAD treated with DES x 1 on 09/20/15. She was discharged with a LifeVest for LV dysfunction, amiodarone for her atrial fib, and triple blood thinner therapy for both her MI and afib. ASA stopped after one month. Coreg cut back due to fatigue. Aldactone stopped due to worsened kidney function. She was taken off amiodarone due to poor appetite, 20 lb weight loss, nausea, dyspnea, weakness, fatigue and cough. ICD implanted 01/23/16.  She was noted to have a high burden of PVCs with a Holter monitor showing a burden of 41%. Had PVC ablation attempt which resulted in no PVCs during immediately post ablation but return of PVCs the next morning.  PVCs were coming from the posterior medial papillary muscle.  Today, denies symptoms of palpitations, chest pain, shortness of breath, orthopnea, PND, lower extremity edema, claudication, dizziness, presyncope, syncope, bleeding, or neurologic sequela. The patient is tolerating medications without difficulties.  2 weeks after ablation, she was diagnosed with influenza.  She is now starting to feel better since her  diagnosis.  She was having a sore throat but was put on prednisone and Tessalon Perles and this has made a large difference in her symptoms.   Past Medical History:  Diagnosis Date  . AICD (automatic cardioverter/defibrillator) present    St. Jude  . CAD in native artery   . Chronic systolic CHF (congestive heart failure) (HCC)   . Hypotension    a. preventing med titration for HF.  Marland Kitchen Hypothyroidism 09/24/2015  . Ischemic cardiomyopathy   . PAF (paroxysmal atrial fibrillation) (HCC) 09/24/2015   a. dx at time of STEMI 08/2015.  . Pre-diabetes   . Presence of permanent cardiac pacemaker   . PVC's (premature ventricular contractions)    Past Surgical History:  Procedure Laterality Date  . CARDIAC CATHETERIZATION N/A 09/20/2015   Procedure: Left Heart Cath and Coronary Angiography;  Surgeon: Kathleene Hazel, MD;  Location: [facility] INVASIVE CV LAB;  Service: Cardiovascular;  Laterality: N/A;  . CARDIAC CATHETERIZATION N/A 09/20/2015   Procedure: Coronary Stent Intervention;  Surgeon: Kathleene Hazel, MD;  Location: [facility] INVASIVE CV LAB;  Service: Cardiovascular;  Laterality: N/A;  . CARDIAC CATHETERIZATION N/A 09/21/2015   Procedure: Left Heart Cath and Coronary Angiography;  Surgeon: Tonny Bollman, MD;  Location: [facility] INVASIVE CV LAB;  Service: Cardiovascular;  Laterality: N/A;  . CARDIAC CATHETERIZATION N/A 11/26/2015   Procedure: Right Heart Cath;  Surgeon: [name], MD;  Location: [facility] INVASIVE CV LAB;  Service: Cardiovascular;  Laterality: N/A;  . CORONARY STENT PLACEMENT  09/20/2015  . EP IMPLANTABLE DEVICE N/A 01/23/2016   Procedure: ICD Implant;  Surgeon: Noe Pittsley Jorja Loa, MD;  Location: [facility] INVASIVE CV LAB;  Service: Cardiovascular;  Laterality: N/A;  . PVC ABLATION N/A 04/02/2017   Procedure: PVC ABLATION;  Surgeon: Regan Lemming, MD;  Location: [facility] INVASIVE CV LAB;  Service: Cardiovascular;  Laterality: N/A;  . TEE WITHOUT CARDIOVERSION N/A 10/08/2016   Procedure:  TRANSESOPHAGEAL ECHOCARDIOGRAM (TEE);  Surgeon: [name], MD;  Location: [facility] ENDOSCOPY;  Service: Cardiovascular;  Laterality: N/A;  . THYROIDECTOMY       Current Outpatient Medications  Medication Sig Dispense Refill  . Calcium Carb-Cholecalciferol (CALCIUM + D3 PO) Take 1 tablet by mouth every morning.    . Coenzyme Q10 (COQ10) 200 MG CAPS Take 200 mg by mouth daily.    . fexofenadine (ALLEGRA) 180 MG tablet Take 180 mg by mouth daily.    . furosemide (LASIX) 20 MG tablet Take 2 tablets (40 mg total) by mouth daily. (Patient taking differently: Take 20 mg by mouth daily. ) 60 tablet 11  . losartan (COZAAR) 25 MG tablet Take 0.5 tablets (12.5 mg total) at bedtime by mouth. 45 tablet 3  . nitroGLYCERIN (NITROSTAT) 0.4 MG SL tablet Place 1 tablet (0.4 mg total) under the tongue every 5 (five) minutes as needed for chest pain. (Patient taking differently: Place 0.4 mg under the tongue every 5 (five) minutes as needed for chest pain. Chest pain) 25 tablet 3  . pantoprazole (PROTONIX) 40 MG tablet Take 1 tablet (40 mg total) by mouth daily. 90 tablet 3  . potassium chloride SA (K-DUR,KLOR-CON) 20 MEQ tablet Take 2 tablets (40 mEq total) by mouth daily. 60 tablet 3  . predniSONE (DELTASONE) 10 MG tablet Take 10 mg by mouth daily with breakfast. For 5 days    . rivaroxaban (XARELTO) 20 MG TABS tablet Take 1 tablet (20 mg total) by mouth daily with supper. 90 tablet 3  . spironolactone (ALDACTONE) 25 MG tablet Take 0.5 tablets (12.5 mg total) by mouth daily. (Patient taking differently: Take 12.5 mg by mouth every evening. ) 15 tablet 3  . SYNTHROID 125 MCG tablet Take 125 mcg by mouth every morning.   5  . traZODone (DESYREL) 50 MG tablet Take 0.5 tablets (25 mg total) by mouth at bedtime as needed for sleep. (Patient taking differently: Take 25 mg by mouth at bedtime. ) 30 tablet 0  . triamcinolone (NASACORT ALLERGY 24HR) 55 MCG/ACT AERO nasal inhaler Place 1 spray into the nose daily.     .  sotalol (BETAPACE) 160 MG tablet Take 1 tablet (160 mg total) by mouth 2 (two) times daily. 60 tablet 3   No current facility-administered medications for this visit.    Facility-Administered Medications Ordered in Other Visits  Medication Dose Route Frequency Provider Last Rate Last Dose  . bivalirudin (ANGIOMAX) 250 mg in sodium chloride 0.9 % 50 mL (5 mg/mL) infusion    Continuous PRN Kathleene Hazel, MD   Stopped at 09/20/15 [phone]  . bivalirudin (ANGIOMAX) BOLUS via infusion    PRN Kathleene Hazel, MD   54.45 mg at 09/20/15 5409  . fentaNYL (SUBLIMAZE) injection    PRN Kathleene Hazel, MD   25 mcg at 09/20/15 0651  . heparin 1,500 mL    PRN Verne Carrow D, MD      . heparin infusion 2 units/mL in 0.9 % sodium chloride    Continuous PRN Kathleene Hazel, MD   1,500 mL at 09/20/15 0733  . iopamidol (ISOVUE-370) 76 % injection  PRN Kathleene Hazel, MD   165 mL at 09/20/15 0733  . lidocaine (PF) (XYLOCAINE) 1 % injection    PRN Kathleene Hazel, MD   2 mL at 09/20/15 1610  . midazolam (VERSED) injection    PRN Kathleene Hazel, MD   1 mg at 09/20/15 0651  . nitroGLYCERIN 1 mg/10 ml (100 mcg/ml) - IR/CATH LAB    PRN Kathleene Hazel, MD   200 mcg at 09/20/15 9604  . Radial Cocktail/Verapamil only    PRN Kathleene Hazel, MD   10 mL at 09/20/15 0629  . ticagrelor (BRILINTA) tablet    PRN Kathleene Hazel, MD   180 mg at 09/20/15 [phone]  . tirofiban (AGGRASTAT) bolus via infusion    PRN Kathleene Hazel, MD   1,815 mcg at 09/20/15 [phone]  . tirofiban (AGGRASTAT) infusion 50 mcg/mL 100 mL    Continuous PRN Kathleene Hazel, MD 13.1 mL/hr at 09/20/15 0651 0.15 mcg/kg/min at 09/20/15 1478    Allergies:   Paxil [paroxetine]; Spironolactone; Morphine and related; Percocet [oxycodone-acetaminophen]; Cefdinir; and Zolpidem   Social History:  The patient  reports that  has never smoked. she has never used  smokeless tobacco. She reports that she does not drink alcohol or use drugs.   Family History:  The patient's family history includes Arrhythmia in her mother; Heart attack in her father.   ROS:  Please see the history of present illness.   Otherwise, review of systems is positive for cough.   All other systems are reviewed and negative.   PHYSICAL EXAM: VS:  BP 90/70   Pulse 77   Ht 5\' 9"  (1.753 m)   Wt 143 lb 3.2 oz (65 kg)   SpO2 97%   BMI 21.15 kg/m  , BMI Body mass index is 21.15 kg/m. GEN: Well nourished, well developed, in no acute distress  HEENT: normal  Neck: no JVD, carotid bruits, or masses Cardiac: iRRR; no murmurs, rubs, or gallops,no edema  Respiratory:  clear to auscultation bilaterally, normal work of breathing GI: soft, nontender, nondistended, + BS MS: no deformity or atrophy  Skin: warm and dry, device site well healed Neuro:  Strength and sensation are intact Psych: euthymic mood, full affect  EKG:  EKG is ordered today. Personal review of the ekg ordered shows sinus rhythm, PVCs  Personal review of the device interrogation today. Results in [facility]   Recent Labs: 02/06/2017: Magnesium 2.1 03/24/2017: BUN 21; Creatinine, Ser 1.14; Hemoglobin 11.8; Platelets 171; Potassium 4.4; Sodium 141    Lipid Panel     Component Value Date/Time   CHOL 110 02/25/2017 1049   TRIG 76 02/25/2017 1049   HDL 51 02/25/2017 1049   CHOLHDL 2.2 02/25/2017 1049   VLDL 15 02/25/2017 1049   LDLCALC 44 02/25/2017 1049     Wt Readings from Last 3 Encounters:  05/11/17 143 lb 3.2 oz (65 kg)  04/03/17 149 lb 9.6 oz (67.9 kg)  03/24/17 150 lb 3.2 oz (68.1 kg)      Other studies Reviewed: Additional studies/ records that were reviewed today include: TEE 10/27/16 Review of the above records today demonstrates:  - Left ventricle: Moderately dilated left ventricle with akinesis   of the septal and anterior walls. EF about 30%. - Aortic valve: There was no stenosis. -  Aorta: Normal caliber aorta with minimal plaque. - Mitral valve: The mitral valve appeared structurally normal with   moderate mitral regurgitation, suspect functional MR. No  pulmonary vein doppler systolic flow reversal noted. - Left atrium: The atrium was mildly dilated. No evidence of   thrombus in the atrial cavity or appendage. - Right ventricle: The cavity size was normal. Systolic function   was normal. - Right atrium: No evidence of thrombus in the atrial cavity or   appendage. - Atrial septum: No defect or patent foramen ovale was identified.   Echo contrast study showed no right-to-left atrial level shunt,   at baseline or with provocation. - Tricuspid valve: Peak RV-RA gradient 31 mmHg.  Holter 01/26/17 - personally reviewed Minimum HR: 58 BPM at 4:28:50 AM(2) Maximum HR: 113 BPM at 10:19:38 AM Average HR: 86 BPM Ventricular Beats: 100073 (41%) Sinus rhythm with PVCs and atrial runs  ASSESSMENT AND PLAN:  1.  Coronary artery disease: No current chest pain.  Feeling well.  No changes.  2. Paroxysmal atrial fibrillation: None noted since hospitalization.  No changes at this time.  This patients CHA2DS2-VASc Score and unadjusted Ischemic Stroke Rate (% per year) is equal to 3.2 % stroke rate/year from a score of 3  Above score calculated as 1 point each if present [CHF, HTN, DM, Vascular=MI/PAD/Aortic Plaque, Age if 65-74, or Female] Above score calculated as 2 points each if present [Age > 75, or Stroke/TIA/TE]   3. Ischemic cardiomyopathy: Status post [facility] Jude ICD.  On optimal medical therapy.  4.  Moderate mitral regurgitation, nonrheumatic: Likely functional due to ischemic cardiomyopathy.  No changes.  5.  PVCs: 41% burden on Holter monitor.  She did have ablation on the posterior medial papillary muscle which resulted in decre no she is currently on 120 mg of sotalol.  Raynell Scott increase to PVCs during the procedure, but the next morning she was continued to have  PVCs.  160 mg and see her back in 3 months for further assessment.  Current medicines are reviewed at length with the patient today.   The patient does not have concerns regarding her medicines.  The following changes were made today: Increase sotalol  Labs/ tests ordered today include:  Orders Placed This Encounter  Procedures  . EKG 12-Lead     Disposition:   FU with Micaela Stith 3 months  Signed, Editha Bridgeforth Jorja Loa, MD  05/11/2017 10:21 AM     [facility] [address] [place] [address] [phone] (office) [phone] (fax)

## 2017-05-28 ENCOUNTER — Ambulatory Visit (HOSPITAL_COMMUNITY)
Admission: RE | Admit: 2017-05-28 | Discharge: 2017-05-28 | Disposition: A | Discharge status: Home / Self Care | Payer: BLUE CROSS/BLUE SHIELD | Source: Ambulatory Visit | Attending: Cardiology | Admitting: Cardiology

## 2017-05-28 ENCOUNTER — Encounter (HOSPITAL_COMMUNITY): Payer: Self-pay | Admitting: Cardiology

## 2017-05-28 ENCOUNTER — Other Ambulatory Visit: Payer: Self-pay

## 2017-05-28 VITALS — BP 91/47 | HR 63 | Wt 146.8 lb

## 2017-05-28 DIAGNOSIS — I255 Ischemic cardiomyopathy: Secondary | ICD-10-CM | POA: Diagnosis not present

## 2017-05-28 DIAGNOSIS — I5022 Chronic systolic (congestive) heart failure: Secondary | ICD-10-CM | POA: Insufficient documentation

## 2017-05-28 DIAGNOSIS — I493 Ventricular premature depolarization: Secondary | ICD-10-CM

## 2017-05-28 DIAGNOSIS — Z95810 Presence of automatic (implantable) cardiac defibrillator: Secondary | ICD-10-CM | POA: Diagnosis not present

## 2017-05-28 DIAGNOSIS — Z7902 Long term (current) use of antithrombotics/antiplatelets: Secondary | ICD-10-CM | POA: Insufficient documentation

## 2017-05-28 DIAGNOSIS — Z79899 Other long term (current) drug therapy: Secondary | ICD-10-CM | POA: Insufficient documentation

## 2017-05-28 DIAGNOSIS — I252 Old myocardial infarction: Secondary | ICD-10-CM | POA: Insufficient documentation

## 2017-05-28 DIAGNOSIS — I340 Nonrheumatic mitral (valve) insufficiency: Secondary | ICD-10-CM | POA: Diagnosis not present

## 2017-05-28 DIAGNOSIS — E785 Hyperlipidemia, unspecified: Secondary | ICD-10-CM | POA: Diagnosis not present

## 2017-05-28 DIAGNOSIS — Z7901 Long term (current) use of anticoagulants: Secondary | ICD-10-CM | POA: Insufficient documentation

## 2017-05-28 DIAGNOSIS — I2510 Atherosclerotic heart disease of native coronary artery without angina pectoris: Secondary | ICD-10-CM | POA: Insufficient documentation

## 2017-05-28 DIAGNOSIS — I480 Paroxysmal atrial fibrillation: Secondary | ICD-10-CM | POA: Insufficient documentation

## 2017-05-28 LAB — BASIC METABOLIC PANEL
Anion gap: 10 (ref 5–15)
BUN: 20 mg/dL (ref 6–20)
CO2: 25 mmol/L (ref 22–32)
Calcium: 8.9 mg/dL (ref 8.9–10.3)
Chloride: 102 mmol/L (ref 101–111)
Creatinine, Ser: 1.07 mg/dL — ABNORMAL HIGH (ref 0.44–1.00)
GFR calc Af Amer: >60 mL/min (ref >60)
GFR calc non Af Amer: 59 mL/min — ABNORMAL LOW (ref >60)
Glucose, Bld: 109 mg/dL — ABNORMAL HIGH (ref 65–99)
Potassium: 4.5 mmol/L (ref 3.5–5.1)
Sodium: 137 mmol/L (ref 135–145)

## 2017-05-28 MED ORDER — SPIRONOLACTONE 25 MG PO TABS: 25.0000 mg | ORAL_TABLET | Freq: Every day | ORAL | 3 refills | Status: DC

## 2017-05-28 MED ORDER — ROSUVASTATIN CALCIUM 10 MG PO TABS: 10.0000 mg | ORAL_TABLET | Freq: Every day | ORAL | 3 refills | Status: DC

## 2017-05-28 NOTE — Patient Instructions (Signed)
Crestor 10 mg (1 tab) daily  Increase Spironolactone 25 mg (1 tab) daily  Labs drawn today (if we do not call you, then your lab work was stable)   Your physician recommends that you return for lab work in: 10 days Rx given  Your physician recommends that you return for lab work in: 2 months Rx given  Your physician has requested that you have an echocardiogram. Echocardiography is a painless test that uses sound waves to create images of your heart. It provides your doctor with information about the size and shape of your heart and how well your heart's chambers and valves are working. This procedure takes approximately one hour. There are no restrictions for this procedure.  Your physician recommends that you schedule a follow-up appointment in: 6 weeks with Fara Chute D   Your physician recommends that you schedule a follow-up appointment in: August,2019 with Dr. Shirlee Latch  an a echocardiogram  Please Call an Schedule Appointment

## 2017-05-28 NOTE — Progress Notes (Signed)
PCP: Dr. Margo Aye HF Cardiology: Dr. Shirlee Latch  52 y.o. with history of CAD s/p anterior STEMI with DES to LAD, ischemic cardiomyopathy, mitral regurgitation, and paroxysmal atrial fibrillation presents for followup of CHF and CAD.  She was admitted in 7/17 with anterior STEMI.  She had a late presentation to the cath lab.  Echo in 10/17 showed EF about 25% with LAD-territory wall motion abnormalities.  She had peri-MI atrial fibrillation and was started on Xarelto.  She was put on amiodarone.   After discharge, she continued to feel poorly.  She developed nausea, anorexia, and significant fatigue.  She was admitted for evaluation in 10/17 given concern for low output heart failure.  However, stopping amiodarone essentially resolved her symptoms.  She had RHC showing preserved cardiac output but the PCWP was high due to prominent v-waves, presumably from significant mitral regurgitation.  Of note, she was in atrial fibrillation for a time during this admission.   She had St Jude ICD placed.  She is back at work for the [employer].   TEE in 8/18 to evaluate the mitral valve showed EF 30%, moderate MR (probably functional).   Patient was seen by Dr. Elberta Fortis and noted to have very frequent PVCs on device interrogation.  Holter in 12/18 showed 41% PVCs.  She was started on sotalol 80 mg bid and titrated up to 120 mg bid.  Coreg was stopped with the addition of sotalol and losartan was decreased to 12.5 mg daily due to low BP.  She had PVC ablation in 2/19 but PVCs recurred. Sotalol was increased to 160 mg bid.   On a higher dose of sotalol, she is feeling better overall.  She says that it makes a big difference, she is less tired/has more energy.  No lightheadedness or dizziness though BP continues to run on the low side.  No chest pain. No significant exertional dyspnea. She continues to work full time.  She is in NSR today.  She is off atorvastatin due to myalgias.   St Jude device interrogated: No  VT, thoracic impedance stable.   ECG (personally reviewed): NSR at 57, left axis deviation, QTc 449 msec.     Labs (10/17): K 3.9, creatinine 1.24, hgb 10.3 Labs (11/17): K 4, creatinine 1.27 Labs (12/17): K 4.2, creatinine 1.12, BNP 439 Labs (2/18): K 4.7, creatinine 1.37 Labs (7/18): K 3.9, creatinine 1.14, hgb 11.7 Labs (12/18): K 4.5, creatinine 1.23 Labs (1/19): K 4.4, creatinine 1.14 Labs (2/19): LDL 97 (off atorvastatin), HDL 56  PMH:  1. CAD: Anterior STEMI in 7/17 with late presentation to the cath lab. She had DES to proximal LAD.  No significant disease in other vessels.  2. Chronic systolic CHF: Ischemic cardiomyopathy.  - Echo 10/17 with EF 25%, akinesis of the mid-apical anteroseptal and anterior walls, apical dyskinesis, moderate to severe MR with incomplete coaptation.  - RHC (10/17): mean RA 4, PA 59/17 mean 38, mean PCWP 27 with prominent V waves suggesting significant MR, CI 3.06, PVR 2 WU.  - ACEI cough.  - Echo (11/17): EF 30-35%, normal RV size and systolic function, severe MR with restriction of posterior leaflet.   3. Atrial fibrillation: Paroxysmal.  Noted 7/17 admission peri-MI, also noted again 10/17 admission. She did not tolerate amiodarone due to nausea/anorexia.  4. Mitral regurgitation: Moderate to severe on 10/17 echo, incomplete coaptation.  ?Etiology, her MI (anterior) does not typically lead to ischemic MR.   - Echo in 11/17 with severe MR, posterior leaflet  restricted.  - TEE (8/18): EF 30%, moderately dilated LV with akinetic septal and anterior walls, moderate functional MR, normal RV size and systolic function.  5. Hyperlipidemia: Myalgias with atorvastatin.  6. PVCs: Holter 12/18 with 41% PVCs.  - PVC ablation in 2/19 with recurrence of PVCs.   SH: Married, lives in [place], works for the [employer], no smoking or ETOH.   Family History  Problem Relation Age of Onset  . Heart attack Father   . Arrhythmia Mother    ROS: All systems reviewed and  negative except as per HPI.   Current Outpatient Prescriptions  Medication Sig Dispense Refill  . atorvastatin (LIPITOR) 80 MG tablet Take 1 tablet (80 mg total) by mouth daily at 6 PM. 30 tablet 11  . Calcium Carb-Cholecalciferol (CALCIUM + D3 PO) Take 1 tablet by mouth every morning.    . carvedilol (COREG) 3.125 MG tablet Take 1 tablet (3.125 mg total) by mouth 2 (two) times daily with a meal. 60 tablet 6  . furosemide (LASIX) 20 MG tablet Take 2 tablets (40 mg total) by mouth daily. 30 tablet 4  . pantoprazole (PROTONIX) 40 MG tablet Take 1 tablet (40 mg total) by mouth daily. 30 tablet 11  . potassium chloride SA (K-DUR,KLOR-CON) 20 MEQ tablet Take 3 tablets (60 mEq total) by mouth daily. 90 tablet 6  . Rivaroxaban (XARELTO) 15 MG TABS tablet Take 1 tablet (15 mg total) by mouth daily with supper. 30 tablet 6  . SYNTHROID 125 MCG tablet Take 125 mcg by mouth every morning.  5  . ticagrelor (BRILINTA) 90 MG TABS tablet Take 1 tablet (90 mg total) by mouth 2 (two) times daily. 180 tablet 3  . traZODone (DESYREL) 50 MG tablet Take 0.5 tablets (25 mg total) by mouth at bedtime as needed for sleep. 30 tablet 0  . triamcinolone (NASACORT ALLERGY 24HR) 55 MCG/ACT AERO nasal inhaler Place 2 sprays into the nose daily.    Marland Kitchen losartan (COZAAR) 25 MG tablet Take 0.5 tablets (12.5 mg total) by mouth at bedtime. 15 tablet 3  . nitroGLYCERIN (NITROSTAT) 0.4 MG SL tablet Place 1 tablet (0.4 mg total) under the tongue every 5 (five) minutes as needed for chest pain. (Patient not taking: Reported on 12/11/2015) 25 tablet 3    BP (!) 91/47   Pulse 63   Wt 146 lb 12.8 oz (66.6 kg)   SpO2 99%   BMI 21.68 kg/m  General: NAD Neck: No JVD, no thyromegaly or thyroid nodule.  Lungs: Clear to auscultation bilaterally with normal respiratory effort. CV: Nondisplaced PMI.  Heart regular S1/S2, no S3/S4, 3/6 HSM apex.  No peripheral edema.  No carotid bruit.  Normal pedal pulses.  Abdomen: Soft, nontender, no  hepatosplenomegaly, no distention.  Skin: Intact without lesions or rashes.  Neurologic: Alert and oriented x 3.  Psych: Normal affect. Extremities: No clubbing or cyanosis.  HEENT: Normal.   Assessment/Plan: 1. CAD: No ischemic-type chest pain.  S/p anterior MI in 7/17 with DES to RCA.   - She is off Plavix now and taking only Xarelto 20 mg daily.  - She is off atorvastatin due to myalgias. I will have her start Crestor 10 mg daily with lipids/LFTs in 2 months. If she cannot tolerate Crestor, she will need Repatha.    2. Chronic systolic CHF: Ischemic cardiomyopathy.  EF 25% on echo in 10/17, 30-35% on echo in 11/17, 30% on TEE in 8/18. She has extensive LAD-territory scar. 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  She is not volume overloaded on exam or by Corevue. NYHA class II symptoms, less fatigued with sotalol, likely due to suppression of PVCs.   - Off Coreg with low BP and addition of sotalol.    - Continue losartan 12.5 mg qhs.    - Continue Lasix 20 mg daily.    - Increase spironolactone to 25 mg daily.  BMET today and again in 10 days.  - Echo in 8/19.  3. Mitral regurgitation: Moderate to severe on 10/17 echo, severe on 11/17 echo.  I did a TEE in 8/18.  This showed moderate MR that appeared to be functional.  No indication for surgery or Mitraclip. She has a prominent MR murmur.  - Repeat echo as above in 8/19.  4. Atrial fibrillation: Unable to tolerate amiodarone due to GI side effects.  She is in NSR today on Xarelto 20 mg daily and sotalol. QTc interval is acceptable on sotalol.   5. PVCs: Very frequent on 12/18 holter (41% beats), she has had a PVC ablation in 2/19. Due to recurrence of PVCs post-ablation, sotalol was increased to 160 mg bid. She does not seem to have as many PVCs now and overall feels considerably better.  - Continue sotalol, QTc ok on ECG today.  6. Hyperlipidemia: As above, myalgias with Lipitor so switching to Crestor.   Followup with pharmacy clinic for med titration in  6 wks, see me in 8/19 in office with echo the same day.   Marca Ancona 05/28/2017

## 2017-06-23 ENCOUNTER — Ambulatory Visit (INDEPENDENT_AMBULATORY_CARE_PROVIDER_SITE_OTHER): Payer: BLUE CROSS/BLUE SHIELD | Admitting: *Deleted

## 2017-06-23 DIAGNOSIS — I255 Ischemic cardiomyopathy: Secondary | ICD-10-CM | POA: Diagnosis not present

## 2017-06-23 NOTE — Progress Notes (Signed)
Remote ICD transmission.   

## 2017-06-24 ENCOUNTER — Encounter: Payer: Self-pay | Admitting: Cardiology

## 2017-06-30 ENCOUNTER — Other Ambulatory Visit: Payer: Self-pay | Admitting: Cardiology

## 2017-07-13 ENCOUNTER — Ambulatory Visit (HOSPITAL_COMMUNITY)
Admission: RE | Admit: 2017-07-13 | Discharge: 2017-07-13 | Disposition: A | Discharge status: Home / Self Care | Payer: BLUE CROSS/BLUE SHIELD | Source: Ambulatory Visit | Attending: Internal Medicine | Admitting: Internal Medicine

## 2017-07-13 DIAGNOSIS — I5022 Chronic systolic (congestive) heart failure: Secondary | ICD-10-CM | POA: Diagnosis not present

## 2017-07-13 LAB — BASIC METABOLIC PANEL
Anion gap: 6 (ref 5–15)
BUN: 19 mg/dL (ref 6–20)
CO2: 30 mmol/L (ref 22–32)
Calcium: 9.1 mg/dL (ref 8.9–10.3)
Chloride: 102 mmol/L (ref 101–111)
Creatinine, Ser: 1.1 mg/dL — ABNORMAL HIGH (ref 0.44–1.00)
GFR calc Af Amer: >60 mL/min (ref >60)
GFR calc non Af Amer: 57 mL/min — ABNORMAL LOW (ref >60)
Glucose, Bld: 90 mg/dL (ref 65–99)
Potassium: 4.3 mmol/L (ref 3.5–5.1)
Sodium: 138 mmol/L (ref 135–145)

## 2017-07-13 NOTE — Progress Notes (Signed)
PCP: Dr. Margo Aye HF Cardiology: Dr. Shirlee Latch   HPI:   52 y.o. with history of CAD s/p anterior STEMI with DES to LAD 7/17, ischemic cardiomyopathy, mitral regurgitation, and paroxysmal atrial fibrillation presents for followup of CHF and CAD.  She was admitted in 7/17 with anterior STEMI.  She had a late presentation to the cath lab.  Echo in 10/17 showed EF about 25% with LAD-territory wall motion abnormalities.  She had peri-MI atrial fibrillation and was started on Xarelto.  She was started on amiodarone for Afib - now off d/t N/V.   After discharge, she continued to feel poorly.  She developed nausea, anorexia, and significant fatigue.  She was admitted for evaluation in 10/17 given concern for low output heart failure.  However, stopping amiodarone essentially resolved her symptoms.  She had RHC showing preserved cardiac output but the PCWP was high due to prominent v-waves, presumably from significant mitral regurgitation.  Of note, she was in atrial fibrillation for a time during this admission.   She had St Jude ICD placed.  She is back at work for the [employer]   TEE in 8/18 to evaluate the mitral valve showed EF 30%, moderate MR (probably functional).   Patient was seen by Dr. Elberta Fortis and noted to have very frequent PVCs on device interrogation.  Holter in 12/18 showed 41% PVCs.  She was started on sotalol 80 mg bid and titrated up to 120 mg bid.  Coreg was stopped with the addition of sotalol and losartan was decreased to 12.5 mg daily due to low BP.  She had PVC ablation in 2/19 but PVCs recurred. Sotalol was increased to 160 mg bid.   On a higher dose of sotalol, she is feeling better overall.  She says that it makes a big difference, she is less tired/has more energy. Occassional lightheadedness or dizziness and BP continues to run on the low side.  No chest pain. No significant exertional dyspnea.  She is off atorvastatin due to myalgias.  Continues to have shoulder  pain and myalgias with rosuvastatin and CoQ10 - tolerable at the moment.    She presents today for initial Pharmacy medication titration clinic visit.  At last clinic visit 4/19 with Dr. Shirlee Latch, atorvastatin was changed to rosuvastatin - which she continues to take along with Co Q10 and has shoulder pain but ore tolerable thatn atorvastatin, and spironolactone was increased to 25mg  daily.  She will need follow up labs today - Bmet.        . Shortness of breath/dyspnea on exertion? no  . Orthopnea/PND? no . Edema? no . Lightheadedness/dizziness? Yes - occasionally when working in the yard . Daily weights at home? yes . Blood pressure/heart rate monitoring at home? no . Following low-sodium/fluid-restricted diet? yes  HF Medications: Furosemide 20mg  daily Potassium 40 mEq daily Losartan 12.5mg  at bedtime Spironolactone 25mg  daily   Has the patient been experiencing any side effects to the medications prescribed?  Yes  - only occasionally dizzy  Does the patient have any problems obtaining medications due to transportation or finances?   no  Understanding of regimen: good Understanding of indications: good Potential of compliance: good Patient understands to avoid NSAIDs. Patient understands to avoid decongestants.    Pertinent Lab Values: Serum creatinine 1.1 (stable 1.07 last month), CO2 30, Potassium 4.3 (stable 4.5 last month) Sodium 138,   Vital Signs: . Weight: 146.8lb (dry weight: 144lb at home - no clothes) . Blood pressure: 94/70 . Heart rate: 55 .  O2 100%  Assessment: 1. Chronicsystolic CHF (EF 25 %), due to ICM. NYHA class II symptoms.  - volume status stable -med changes - check Bmet at this visit after increase in Sprionolactone last month. Her blood pressure is low but she is without symptoms of dizziness if she is lsow with position changes.  We discussed increasing losartan to 25mg  at bedtime but she would like to give her body another month to adjust to  increased spironolactone.  This is the best she has felt since her MI and doesn't want to rock the boat.  But understands that we will try to increase in the future.   -continue current HF meds  Furosemide 20mg  daily Potassium 40 mEq daily Losartan 12.5mg  at bedtime Spironolactone 25mg  daily - Basic disease state pathophysiology, medication indication, mechanism and side effects reviewed at length with patient and he verbalized understanding  1. CAD: No ischemic-type chest pain.  S/p anterior MI in 7/17 with DES to RCA.   - She is off Plavix now and taking only Xarelto 20 mg daily.  - She is off atorvastatin due to myalgias. Started Crestor 10 mg daily with lipids/LFTs in 2 months. If she cannot tolerate Crestor, she will need Repatha.    We discussed this referral today but she states her shoulder pain is currently tolerable and will wait to see if it worsens before going to lipid clinic for Repatha  injection.  2. Chronic systolic CHF: Ischemic cardiomyopathy.  EF 25% on echo in 10/17, 30-35% on echo in 11/17, 30% on TEE in 8/18. She has extensive LAD-territory scar. 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  She is not volume overloaded on exam or by Corevue. NYHA class II symptoms, less fatigued with sotalol, likely due to suppression of PVCs.   - Off Coreg with low BP and addition of sotalol.    - Continue losartan 12.5 mg qhs.    - Continue Lasix 20 mg daily.    -Continue Spironolactone 25mg  daily.  - Echo in 8/19.  3. Mitral regurgitation: Moderate to severe on 10/17 echo, severe on 11/17 echo.  I did a TEE in 8/18.  This showed moderate MR that appeared to be functional.  No indication for surgery or Mitraclip. She has a prominent MR murmur.  - Repeat echo as above in 8/19. per MD 4. Atrial fibrillation: Unable to tolerate amiodarone due to GI side effects.  She is in NSR today on Xarelto 20 mg daily and sotalol. QTc interval is acceptable on sotalol.  per MD 5. PVCs: Very frequent on 12/18 holter (41%  beats), she has had a PVC ablation in 2/19. Due to recurrence of PVCs post-ablation, sotalol was increased to 160 mg bid. She does not seem to have as many PVCs now and overall feels considerably better. Per EP - Continue sotalol, QTc ok on ECG today.  6. Hyperlipidemia: As above, myalgias with Lipitor so switched to Crestor.     Plan: 1) Medication changes: Based on clinical presentation, vital signs and recent labs - with soft BP and d/t patient wishes to wait 1 more month prior to increasing losartan  - will continue current medications this visit - with plans to increase losartan 25mg  q HS at next visit if BP tolerates Furosemide 20mg  daily Potassium 40 mEq daily Losartan 12.5mg  at bedtime Spironolactone 25mg  daily 2) Labs: BMET today - k and Cr stable  3) Follow-up: with Dr. Shirlee Latch 8/19    Leota Sauers Pharm.D. CPP, BCPS Clinical Pharmacist  [phone] 07/13/2017 1:53 PM

## 2017-07-14 LAB — CUP PACEART REMOTE DEVICE CHECK
Battery Remaining Longevity: 90 mo
Battery Remaining Percentage: 85 %
Battery Voltage: 3.02 V
Brady Statistic RV Percent Paced: 1 %
Date Time Interrogation Session: 20190430060025
HighPow Impedance: 72 Ohm
HighPow Impedance: 72 Ohm
Implantable Lead Implant Date: 20171129
Implantable Lead Location: 753860
Implantable Pulse Generator Implant Date: 20171129
Lead Channel Impedance Value: 450 Ohm
Lead Channel Pacing Threshold Amplitude: 0.75 V
Lead Channel Pacing Threshold Pulse Width: 0.5 ms
Lead Channel Sensing Intrinsic Amplitude: 12 mV
Lead Channel Setting Pacing Amplitude: 2.5 V
Lead Channel Setting Pacing Pulse Width: 0.5 ms
Lead Channel Setting Sensing Sensitivity: 0.5 mV
Pulse Gen Serial Number: 7377983

## 2017-07-15 ENCOUNTER — Encounter: Payer: Self-pay | Admitting: Internal Medicine

## 2017-08-14 ENCOUNTER — Other Ambulatory Visit: Payer: Self-pay

## 2017-08-14 ENCOUNTER — Ambulatory Visit (INDEPENDENT_AMBULATORY_CARE_PROVIDER_SITE_OTHER): Payer: BLUE CROSS/BLUE SHIELD | Admitting: Cardiology

## 2017-08-14 ENCOUNTER — Encounter: Payer: Self-pay | Admitting: Cardiology

## 2017-08-14 VITALS — BP 92/60 | HR 57 | Ht 69.0 in | Wt 147.0 lb

## 2017-08-14 DIAGNOSIS — I340 Nonrheumatic mitral (valve) insufficiency: Secondary | ICD-10-CM | POA: Diagnosis not present

## 2017-08-14 DIAGNOSIS — I493 Ventricular premature depolarization: Secondary | ICD-10-CM

## 2017-08-14 DIAGNOSIS — I2510 Atherosclerotic heart disease of native coronary artery without angina pectoris: Secondary | ICD-10-CM

## 2017-08-14 DIAGNOSIS — I255 Ischemic cardiomyopathy: Secondary | ICD-10-CM

## 2017-08-14 DIAGNOSIS — Z79899 Other long term (current) drug therapy: Secondary | ICD-10-CM

## 2017-08-14 LAB — CUP PACEART INCLINIC DEVICE CHECK
Battery Remaining Longevity: 90 mo
Brady Statistic RV Percent Paced: 0.71 %
Date Time Interrogation Session: 20190621103802
HighPow Impedance: 73.125
Implantable Lead Implant Date: 20171129
Implantable Lead Location: 753860
Implantable Pulse Generator Implant Date: 20171129
Lead Channel Impedance Value: 412.5 Ohm
Lead Channel Pacing Threshold Amplitude: 0.75 V
Lead Channel Pacing Threshold Amplitude: 0.75 V
Lead Channel Pacing Threshold Pulse Width: 0.5 ms
Lead Channel Pacing Threshold Pulse Width: 0.5 ms
Lead Channel Sensing Intrinsic Amplitude: 12 mV
Lead Channel Setting Pacing Amplitude: 2.5 V
Lead Channel Setting Pacing Pulse Width: 0.5 ms
Lead Channel Setting Sensing Sensitivity: 0.5 mV
Pulse Gen Serial Number: 7377983

## 2017-08-14 MED ORDER — NITROGLYCERIN 0.4 MG SL SUBL: 0.4000 mg | SUBLINGUAL_TABLET | SUBLINGUAL | 3 refills | Status: DC | PRN

## 2017-08-14 NOTE — Patient Instructions (Addendum)
Medication Instructions:  Your physician recommends that you continue on your current medications as directed. Please refer to the Current Medication list given to you today.  Labwork: None ordered  Testing/Procedures: None ordered  Follow-Up: Your physician wants you to follow-up in: 6 months with Dr. Camnitz.  You will receive a reminder letter in the mail two months in advance. If you don't receive a letter, please call our office to schedule the follow-up appointment.  * If you need a refill on your cardiac medications before your next appointment, please call your pharmacy.   *Please note that any paperwork needing to be filled out by the provider will need to be addressed at the front desk prior to seeing the provider. Please note that any FMLA, disability or other documents regarding health condition is subject to a $25.00 charge that must be received prior to completion of paperwork in the form of a money order or check.  Thank you for choosing [facility]!!   Ashlyn Cabler, RN [phone]  Any Other Special Instructions Will Be Listed Below (If Applicable).        

## 2017-08-14 NOTE — Progress Notes (Signed)
Electrophysiology Office Note   Date:  08/14/2017   ID:  Devine Bobrowski, DOB [birth year], MRN [id number]  PCP:  Benita Stabile, MD  Cardiologist:  Janett Labella Primary Electrophysiologist:  Regan Lemming, MD    Chief Complaint  Patient presents with  . Defib Check    PVC's/PAF/Ischemic cardiomyopathy/Chronic systolic HF     History of Present Illness: Catherine Mays is a 52 y.o. female who presents today for electrophysiology evaluation.   History of CAD (STEMI 08/2015 s/p PTCA/DES to mid LAD, mild residual mRCA), ischemic cardiomyopathy, chronic systolic CHF, paroxysmal atrial fib (dx at time of STEMI), PVCs, hypotension. She had presented to the [facility] ED with chest pain that night and was sent home. She came back in later that night with continued chest pain and EKG showed ST elevation c/w an anterior MI. Emergent transport to [facility] and cardiac cath with occluded mid LAD treated with DES x 1 on 09/20/15. She was discharged with a LifeVest for LV dysfunction, amiodarone for her atrial fib, and triple blood thinner therapy for both her MI and afib. ASA stopped after one month. Coreg cut back due to fatigue. Aldactone stopped due to worsened kidney function. She was taken off amiodarone due to poor appetite, 20 lb weight loss, nausea, dyspnea, weakness, fatigue and cough. ICD implanted 01/23/16.  She was noted to have a high burden of PVCs with a Holter monitor showing a burden of 41%. Had PVC ablation attempt which resulted in no PVCs during immediately post ablation but return of PVCs the next morning.  PVCs were coming from the posterior medial papillary muscle.  Today, denies symptoms of palpitations, chest pain, shortness of breath, orthopnea, PND, lower extremity edema, claudication, dizziness, presyncope, syncope, bleeding, or neurologic sequela. The patient is tolerating medications without difficulties.  Overall she is doing well.  She has much more energy since  increasing her sotalol dose.  She has not noticed any further episodes of palpitations.  She does not have any chest pain.  She feels that the sotalol and ablation combination has greatly improved her PVCs.  Past Medical History:  Diagnosis Date  . AICD (automatic cardioverter/defibrillator) present    St. Jude  . CAD in native artery   . Chronic systolic CHF (congestive heart failure) (HCC)   . Hypotension    a. preventing med titration for HF.  Marland Kitchen Hypothyroidism 09/24/2015  . Ischemic cardiomyopathy   . PAF (paroxysmal atrial fibrillation) (HCC) 09/24/2015   a. dx at time of STEMI 08/2015.  . Pre-diabetes   . Presence of permanent cardiac pacemaker   . PVC's (premature ventricular contractions)    Past Surgical History:  Procedure Laterality Date  . CARDIAC CATHETERIZATION N/A 09/20/2015   Procedure: Left Heart Cath and Coronary Angiography;  Surgeon: Kathleene Hazel, MD;  Location: [facility] INVASIVE CV LAB;  Service: Cardiovascular;  Laterality: N/A;  . CARDIAC CATHETERIZATION N/A 09/20/2015   Procedure: Coronary Stent Intervention;  Surgeon: Kathleene Hazel, MD;  Location: [facility] INVASIVE CV LAB;  Service: Cardiovascular;  Laterality: N/A;  . CARDIAC CATHETERIZATION N/A 09/21/2015   Procedure: Left Heart Cath and Coronary Angiography;  Surgeon: Tonny Bollman, MD;  Location: [facility] INVASIVE CV LAB;  Service: Cardiovascular;  Laterality: N/A;  . CARDIAC CATHETERIZATION N/A 11/26/2015   Procedure: Right Heart Cath;  Surgeon: [name], MD;  Location: [facility] INVASIVE CV LAB;  Service: Cardiovascular;  Laterality: N/A;  . CORONARY STENT PLACEMENT  09/20/2015  . EP IMPLANTABLE DEVICE N/A 01/23/2016  Procedure: ICD Implant;  Surgeon: Shenise Wolgamott Jorja Loa, MD;  Location: [facility] INVASIVE CV LAB;  Service: Cardiovascular;  Laterality: N/A;  . PVC ABLATION N/A 04/02/2017   Procedure: PVC ABLATION;  Surgeon: Regan Lemming, MD;  Location: [facility] INVASIVE CV LAB;  Service: Cardiovascular;  Laterality:  N/A;  . TEE WITHOUT CARDIOVERSION N/A 10/08/2016   Procedure: TRANSESOPHAGEAL ECHOCARDIOGRAM (TEE);  Surgeon: [name], MD;  Location: [facility] ENDOSCOPY;  Service: Cardiovascular;  Laterality: N/A;  . THYROIDECTOMY       Current Outpatient Medications  Medication Sig Dispense Refill  . Calcium Carb-Cholecalciferol (CALCIUM + D3 PO) Take 1 tablet by mouth every morning.    . Coenzyme Q10 (COQ10) 200 MG CAPS Take 200 mg by mouth daily.    . fexofenadine (ALLEGRA) 180 MG tablet Take 180 mg by mouth daily.    . furosemide (LASIX) 20 MG tablet Take 20 mg by mouth daily.    Marland Kitchen losartan (COZAAR) 25 MG tablet Take 0.5 tablets (12.5 mg total) at bedtime by mouth. 45 tablet 3  . nitroGLYCERIN (NITROSTAT) 0.4 MG SL tablet Place 1 tablet (0.4 mg total) under the tongue every 5 (five) minutes as needed for chest pain. 25 tablet 3  . pantoprazole (PROTONIX) 40 MG tablet Take 1 tablet (40 mg total) by mouth daily. 90 tablet 3  . potassium chloride SA (K-DUR,KLOR-CON) 20 MEQ tablet Take 2 tablets (40 mEq total) by mouth daily. 60 tablet 3  . rivaroxaban (XARELTO) 20 MG TABS tablet Take 1 tablet (20 mg total) by mouth daily with supper. 90 tablet 3  . rosuvastatin (CRESTOR) 10 MG tablet Take 1 tablet (10 mg total) by mouth daily. 30 tablet 3  . sotalol (BETAPACE) 160 MG tablet Take 1 tablet (160 mg total) by mouth 2 (two) times daily. 60 tablet 3  . spironolactone (ALDACTONE) 25 MG tablet Take 1 tablet (25 mg total) by mouth daily. 30 tablet 3  . SYNTHROID 125 MCG tablet Take 125 mcg by mouth every morning.   5  . traZODone (DESYREL) 50 MG tablet Take 0.5 tablets (25 mg total) by mouth at bedtime as needed for sleep. (Patient taking differently: Take 25 mg by mouth at bedtime. ) 30 tablet 0  . triamcinolone (NASACORT ALLERGY 24HR) 55 MCG/ACT AERO nasal inhaler Place 1 spray into the nose daily.      No current facility-administered medications for this visit.    Facility-Administered Medications Ordered  in Other Visits  Medication Dose Route Frequency Provider Last Rate Last Dose  . bivalirudin (ANGIOMAX) 250 mg in sodium chloride 0.9 % 50 mL (5 mg/mL) infusion    Continuous PRN Kathleene Hazel, MD   Stopped at 09/20/15 [phone]  . bivalirudin (ANGIOMAX) BOLUS via infusion    PRN Kathleene Hazel, MD   54.45 mg at 09/20/15 9604  . fentaNYL (SUBLIMAZE) injection    PRN Kathleene Hazel, MD   25 mcg at 09/20/15 0651  . heparin 1,500 mL    PRN Verne Carrow D, MD      . heparin infusion 2 units/mL in 0.9 % sodium chloride    Continuous PRN Kathleene Hazel, MD   1,500 mL at 09/20/15 0733  . iopamidol (ISOVUE-370) 76 % injection    PRN Kathleene Hazel, MD   165 mL at 09/20/15 0733  . lidocaine (PF) (XYLOCAINE) 1 % injection    PRN Kathleene Hazel, MD   2 mL at 09/20/15 5409  . midazolam (VERSED) injection  PRN Kathleene Hazel, MD   1 mg at 09/20/15 [phone]  . nitroGLYCERIN 1 mg/10 ml (100 mcg/ml) - IR/CATH LAB    PRN Kathleene Hazel, MD   200 mcg at 09/20/15 9604  . Radial Cocktail/Verapamil only    PRN Kathleene Hazel, MD   10 mL at 09/20/15 0629  . ticagrelor (BRILINTA) tablet    PRN Kathleene Hazel, MD   180 mg at 09/20/15 [phone]  . tirofiban (AGGRASTAT) bolus via infusion    PRN Kathleene Hazel, MD   1,815 mcg at 09/20/15 [phone]  . tirofiban (AGGRASTAT) infusion 50 mcg/mL 100 mL    Continuous PRN Kathleene Hazel, MD 13.1 mL/hr at 09/20/15 0651 0.15 mcg/kg/min at 09/20/15 1478    Allergies:   Paxil [paroxetine]; Morphine and related; Percocet [oxycodone-acetaminophen]; Cefdinir; and Zolpidem   Social History:  The patient  reports that she has never smoked. She has never used smokeless tobacco. She reports that she does not drink alcohol or use drugs.   Family History:  The patient's family history includes Arrhythmia in her mother; Heart attack in her father.   ROS:  Please see the history of present  illness.   Otherwise, review of systems is positive for none.   All other systems are reviewed and negative.   PHYSICAL EXAM: VS:  BP 92/60   Pulse (!) 57   Ht 5\' 9"  (1.753 m)   Wt 147 lb (66.7 kg)   BMI 21.71 kg/m  , BMI Body mass index is 21.71 kg/m. GEN: Well nourished, well developed, in no acute distress  HEENT: normal  Neck: no JVD, carotid bruits, or masses Cardiac: RRR; no murmurs, rubs, or gallops,no edema  Respiratory:  clear to auscultation bilaterally, normal work of breathing GI: soft, nontender, nondistended, + BS MS: no deformity or atrophy  Skin: warm and dry, device site well healed Neuro:  Strength and sensation are intact Psych: euthymic mood, full affect  EKG:  EKG is ordered today. Personal review of the ekg ordered shows SR, anterior infarct, rate 57  Personal review of the device interrogation today. Results in P[facility]art   Recent Labs: 02/06/2017: Magnesium 2.1 03/24/2017: Hemoglobin 11.8; Platelets 171 07/13/2017: BUN 19; Creatinine, Ser 1.10; Potassium 4.3; Sodium 138    Lipid Panel     Component Value Date/Time   CHOL 110 02/25/2017 1049   TRIG 76 02/25/2017 1049   HDL 51 02/25/2017 1049   CHOLHDL 2.2 02/25/2017 1049   VLDL 15 02/25/2017 1049   LDLCALC 44 02/25/2017 1049     Wt Readings from Last 3 Encounters:  08/14/17 147 lb (66.7 kg)  05/28/17 146 lb 12.8 oz (66.6 kg)  05/11/17 143 lb 3.2 oz (65 kg)      Other studies Reviewed: Additional studies/ records that were reviewed today include: TEE 10/27/16 Review of the above records today demonstrates:  - Left ventricle: Moderately dilated left ventricle with akinesis   of the septal and anterior walls. EF about 30%. - Aortic valve: There was no stenosis. - Aorta: Normal caliber aorta with minimal plaque. - Mitral valve: The mitral valve appeared structurally normal with   moderate mitral regurgitation, suspect functional MR. No   pulmonary vein doppler systolic flow reversal noted. -  Left atrium: The atrium was mildly dilated. No evidence of   thrombus in the atrial cavity or appendage. - Right ventricle: The cavity size was normal. Systolic function   was normal. - Right atrium: No evidence of thrombus  in the atrial cavity or   appendage. - Atrial septum: No defect or patent foramen ovale was identified.   Echo contrast study showed no right-to-left atrial level shunt,   at baseline or with provocation. - Tricuspid valve: Peak RV-RA gradient 31 mmHg.  Holter 01/26/17 - personally reviewed Minimum HR: 58 BPM at 4:28:50 AM(2) Maximum HR: 113 BPM at 10:19:38 AM Average HR: 86 BPM Ventricular Beats: 100073 (41%) Sinus rhythm with PVCs and atrial runs  ASSESSMENT AND PLAN:  1.  Coronary artery disease: Current chest pain.  No changes at this time.  2. Paroxysmal atrial fibrillation: None noted since hospitalization.  No changes.  This patients CHA2DS2-VASc Score and unadjusted Ischemic Stroke Rate (% per year) is equal to 3.2 % stroke rate/year from a score of 3  Above score calculated as 1 point each if present [CHF, HTN, DM, Vascular=MI/PAD/Aortic Plaque, Age if 65-74, or Female] Above score calculated as 2 points each if present [Age > 75, or Stroke/TIA/TE]  3. Ischemic cardiomyopathy: Jude ICD.  On optimal medical therapy.  No signs of volume overload.  4.  Moderate mitral regurgitation, nonrheumatic: Likely functional due to ischemic cardiomyopathy.  No changes at this time.  5.  PVCs: Had a PVC burden of 41% on her Holter monitor.  Ablation of the posterior medial papillary muscle which resulted in a decrease of her PVC burden.  Her sotalol was increased to 160 mg twice a day and this is greatly improved her PVC burden.  No changes at this time.  Current medicines are reviewed at length with the patient today.   The patient does not have concerns regarding her medicines.  The following changes were made today: None  Labs/ tests ordered today include:    Orders Placed This Encounter  Procedures  . Basic Metabolic Panel (BMET)  . Magnesium  . EKG 12-Lead     Disposition:   FU with Rosalinda Seaman 6 months  Signed, Leandro Berkowitz Jorja Loa, MD  08/14/2017 10:00 AM     [facility] [address] [place] [address] [phone] (office) [phone] (fax)

## 2017-09-01 ENCOUNTER — Other Ambulatory Visit: Payer: Self-pay | Admitting: Cardiology

## 2017-09-11 ENCOUNTER — Other Ambulatory Visit (HOSPITAL_COMMUNITY): Payer: Self-pay | Admitting: Cardiology

## 2017-09-11 ENCOUNTER — Other Ambulatory Visit: Payer: Self-pay | Admitting: Cardiovascular Disease

## 2017-09-11 ENCOUNTER — Other Ambulatory Visit (HOSPITAL_COMMUNITY): Payer: Self-pay | Admitting: *Deleted

## 2017-09-11 MED ORDER — ROSUVASTATIN CALCIUM 10 MG PO TABS: 10.0000 mg | ORAL_TABLET | Freq: Every day | ORAL | 3 refills | Status: DC

## 2017-09-11 MED ORDER — POTASSIUM CHLORIDE CRYS ER 20 MEQ PO TBCR: 60.0000 meq | EXTENDED_RELEASE_TABLET | Freq: Every day | ORAL | 3 refills | Status: DC

## 2017-09-19 ENCOUNTER — Other Ambulatory Visit: Payer: Self-pay | Admitting: Cardiovascular Disease

## 2017-09-21 NOTE — Telephone Encounter (Signed)
Pt last saw Dr Elberta Fortis 08/14/17, last labs 07/13/17 Creat 1.10, age 52, weight 66.7kg, CrCl 63.71, based on CrCl pt is on appropriate dosage of Xarelto 20mg  QD.  Will refill rx.

## 2017-09-22 ENCOUNTER — Ambulatory Visit (INDEPENDENT_AMBULATORY_CARE_PROVIDER_SITE_OTHER): Payer: Self-pay | Admitting: *Deleted

## 2017-09-22 DIAGNOSIS — I255 Ischemic cardiomyopathy: Secondary | ICD-10-CM

## 2017-09-22 NOTE — Progress Notes (Signed)
Remote ICD transmission.   

## 2017-09-23 ENCOUNTER — Encounter: Payer: Self-pay | Admitting: Cardiology

## 2017-10-05 ENCOUNTER — Ambulatory Visit (HOSPITAL_BASED_OUTPATIENT_CLINIC_OR_DEPARTMENT_OTHER)
Admission: RE | Admit: 2017-10-05 | Discharge: 2017-10-05 | Disposition: A | Discharge status: Home / Self Care | Payer: BLUE CROSS/BLUE SHIELD | Source: Ambulatory Visit | Attending: Cardiology | Admitting: Cardiology

## 2017-10-05 ENCOUNTER — Ambulatory Visit (HOSPITAL_COMMUNITY)
Admission: RE | Admit: 2017-10-05 | Discharge: 2017-10-05 | Disposition: A | Discharge status: Home / Self Care | Payer: BLUE CROSS/BLUE SHIELD | Source: Ambulatory Visit | Attending: Internal Medicine | Admitting: Internal Medicine

## 2017-10-05 VITALS — BP 102/62 | HR 54 | Wt 148.2 lb

## 2017-10-05 DIAGNOSIS — I2510 Atherosclerotic heart disease of native coronary artery without angina pectoris: Secondary | ICD-10-CM

## 2017-10-05 DIAGNOSIS — Z7901 Long term (current) use of anticoagulants: Secondary | ICD-10-CM | POA: Diagnosis not present

## 2017-10-05 DIAGNOSIS — Z955 Presence of coronary angioplasty implant and graft: Secondary | ICD-10-CM | POA: Insufficient documentation

## 2017-10-05 DIAGNOSIS — I493 Ventricular premature depolarization: Secondary | ICD-10-CM | POA: Diagnosis not present

## 2017-10-05 DIAGNOSIS — I340 Nonrheumatic mitral (valve) insufficiency: Secondary | ICD-10-CM | POA: Diagnosis not present

## 2017-10-05 DIAGNOSIS — Z95810 Presence of automatic (implantable) cardiac defibrillator: Secondary | ICD-10-CM | POA: Insufficient documentation

## 2017-10-05 DIAGNOSIS — Z79899 Other long term (current) drug therapy: Secondary | ICD-10-CM | POA: Diagnosis not present

## 2017-10-05 DIAGNOSIS — I4891 Unspecified atrial fibrillation: Secondary | ICD-10-CM | POA: Diagnosis present

## 2017-10-05 DIAGNOSIS — I252 Old myocardial infarction: Secondary | ICD-10-CM | POA: Diagnosis not present

## 2017-10-05 DIAGNOSIS — I255 Ischemic cardiomyopathy: Secondary | ICD-10-CM | POA: Diagnosis not present

## 2017-10-05 DIAGNOSIS — Z7951 Long term (current) use of inhaled steroids: Secondary | ICD-10-CM | POA: Insufficient documentation

## 2017-10-05 DIAGNOSIS — Z8249 Family history of ischemic heart disease and other diseases of the circulatory system: Secondary | ICD-10-CM | POA: Insufficient documentation

## 2017-10-05 DIAGNOSIS — I5022 Chronic systolic (congestive) heart failure: Secondary | ICD-10-CM | POA: Diagnosis not present

## 2017-10-05 DIAGNOSIS — I480 Paroxysmal atrial fibrillation: Secondary | ICD-10-CM | POA: Insufficient documentation

## 2017-10-05 DIAGNOSIS — I272 Pulmonary hypertension, unspecified: Secondary | ICD-10-CM | POA: Diagnosis not present

## 2017-10-05 DIAGNOSIS — E785 Hyperlipidemia, unspecified: Secondary | ICD-10-CM | POA: Diagnosis not present

## 2017-10-05 LAB — CBC
HCT: 39.4 % (ref 36.0–46.0)
Hemoglobin: 12.4 g/dL (ref 12.0–15.0)
MCH: 29.5 pg (ref 26.0–34.0)
MCHC: 31.5 g/dL (ref 30.0–36.0)
MCV: 93.6 fL (ref 78.0–100.0)
Platelets: 152 10*3/uL (ref 150–400)
RBC: 4.21 MIL/uL (ref 3.87–5.11)
RDW: 12.5 % (ref 11.5–15.5)
WBC: 5.8 10*3/uL (ref 4.0–10.5)

## 2017-10-05 LAB — BASIC METABOLIC PANEL
Anion gap: 9 (ref 5–15)
BUN: 20 mg/dL (ref 6–20)
CO2: 29 mmol/L (ref 22–32)
Calcium: 9.2 mg/dL (ref 8.9–10.3)
Chloride: 101 mmol/L (ref 98–111)
Creatinine, Ser: 1.02 mg/dL — ABNORMAL HIGH (ref 0.44–1.00)
GFR calc Af Amer: >60 mL/min (ref >60)
GFR calc non Af Amer: >60 mL/min (ref >60)
Glucose, Bld: 108 mg/dL — ABNORMAL HIGH (ref 70–99)
Potassium: 4.6 mmol/L (ref 3.5–5.1)
Sodium: 139 mmol/L (ref 135–145)

## 2017-10-05 LAB — LIPID PANEL
Cholesterol: 137 mg/dL (ref 0–200)
HDL: 57 mg/dL (ref >40)
LDL Cholesterol: 52 mg/dL (ref 0–99)
Total CHOL/HDL Ratio: 2.4 RATIO
Triglycerides: 140 mg/dL (ref <150)
VLDL: 28 mg/dL (ref 0–40)

## 2017-10-05 MED ORDER — LOSARTAN POTASSIUM 25 MG PO TABS: 25.0000 mg | ORAL_TABLET | Freq: Every day | ORAL | 3 refills | Status: DC

## 2017-10-05 NOTE — Patient Instructions (Signed)
INCREASE Losartan to 25 mg (1 whole tablet) once daily.  Routine lab work today. Will notify you of abnormal results, otherwise no news is good news!  Have labs redrawn in 10 days. Take Rx paper to preferred location for lab draw.  Follow up 3 months with Dr. Shirlee Latch.  ______________________________________________________________ [place] Code: 1800  Take all medication as prescribed the day of your appointment. Bring all medications with you to your appointment.  Do the following things EVERYDAY: 1) Weigh yourself in the morning before breakfast. Write it down and keep it in a log. 2) Take your medicines as prescribed 3) Eat low salt foods-Limit salt (sodium) to 2000 mg per day.  4) Stay as active as you can everyday 5) Limit all fluids for the day to less than 2 liters

## 2017-10-05 NOTE — Progress Notes (Signed)
  Echocardiogram 2D Echocardiogram has been performed.  Belva Chimes 10/05/2017, 11:05 AM

## 2017-10-06 ENCOUNTER — Encounter (HOSPITAL_COMMUNITY): Payer: Self-pay | Admitting: *Deleted

## 2017-10-06 NOTE — Progress Notes (Signed)
PCP: Dr. Margo Aye HF Cardiology: Dr. Shirlee Latch  52 y.o. with history of CAD s/p anterior STEMI with DES to LAD, ischemic cardiomyopathy, mitral regurgitation, and paroxysmal atrial fibrillation presents for followup of CHF and CAD.  She was admitted in 7/17 with anterior STEMI.  She had a late presentation to the cath lab.  Echo in 10/17 showed EF about 25% with LAD-territory wall motion abnormalities.  She had peri-MI atrial fibrillation and was started on Xarelto.  She was put on amiodarone.   After discharge, she continued to feel poorly.  She developed nausea, anorexia, and significant fatigue.  She was admitted for evaluation in 10/17 given concern for low output heart failure.  However, stopping amiodarone essentially resolved her symptoms.  She had RHC showing preserved cardiac output but the PCWP was high due to prominent v-waves, presumably from significant mitral regurgitation.  Of note, she was in atrial fibrillation for a time during this admission.   She had St Jude ICD placed.  She is back at work for the [employer].   TEE in 8/18 to evaluate the mitral valve showed EF 30%, moderate MR (probably functional).    Patient was seen by Dr. Elberta Fortis and noted to have very frequent PVCs on device interrogation.  Holter in 12/18 showed 41% PVCs.  She was started on sotalol 80 mg bid and titrated up to 120 mg bid.  Coreg was stopped with the addition of sotalol and losartan was decreased to 12.5 mg daily due to low BP.  She had PVC ablation in 2/19 but PVCs recurred. Sotalol was increased to 160 mg bid.   Echo today shows EF 30% with regional wall motion abnormalities, normal RV size and systolic function, moderate MR.   On a higher dose of sotalol, she is feeling better overall.  No palpitations.  No significant exertional dyspnea.  No chest pain.  No BRBPR/melena.  Weight stable.   St Jude device interrogated today: No VT, thoracic impedance stable.   ECG (personally reviewed): NSR,  LAFB, old anterior MI, QTc 472 msec    Labs (10/17): K 3.9, creatinine 1.24, hgb 10.3 Labs (11/17): K 4, creatinine 1.27 Labs (12/17): K 4.2, creatinine 1.12, BNP 439 Labs (2/18): K 4.7, creatinine 1.37 Labs (7/18): K 3.9, creatinine 1.14, hgb 11.7 Labs (12/18): K 4.5, creatinine 1.23 Labs (1/19): K 4.4, creatinine 1.14 Labs (2/19): LDL 97 (off atorvastatin), HDL 56 Labs (5/19): K 4.3, creatinine 1.1  PMH:  1. CAD: Anterior STEMI in 7/17 with late presentation to the cath lab. She had DES to proximal LAD.  No significant disease in other vessels.  2. Chronic systolic CHF: Ischemic cardiomyopathy.  - Echo 10/17 with EF 25%, akinesis of the mid-apical anteroseptal and anterior walls, apical dyskinesis, moderate to severe MR with incomplete coaptation.  - RHC (10/17): mean RA 4, PA 59/17 mean 38, mean PCWP 27 with prominent V waves suggesting significant MR, CI 3.06, PVR 2 WU.  - ACEI cough.  - Echo (11/17): EF 30-35%, normal RV size and systolic function, severe MR with restriction of posterior leaflet.   3. Atrial fibrillation: Paroxysmal.  Noted 7/17 admission peri-MI, also noted again 10/17 admission. She did not tolerate amiodarone due to nausea/anorexia.  4. Mitral regurgitation: Moderate to severe on 10/17 echo, incomplete coaptation.  ?Etiology, her MI (anterior) does not typically lead to ischemic MR.   - Echo in 11/17 with severe MR, posterior leaflet restricted.  - TEE (8/18): EF 30%, moderately dilated LV with akinetic septal  and anterior walls, moderate functional MR, normal RV size and systolic function.  5. Hyperlipidemia: Myalgias with atorvastatin.  6. PVCs: Holter 12/18 with 41% PVCs.  - PVC ablation in 2/19 with recurrence of PVCs.   SH: Married, lives in [place] [address], works for the [employer], no smoking or ETOH.   Family History  Problem Relation Age of Onset  . Heart attack Father   . Arrhythmia Mother    ROS: All systems reviewed and negative except as per HPI.   Current  Outpatient Medications on File Prior to Encounter  Medication Sig Dispense Refill  . Calcium Carb-Cholecalciferol (CALCIUM + D3 PO) Take 1 tablet by mouth every morning.    . Coenzyme Q10 (COQ10) 200 MG CAPS Take 200 mg by mouth daily.    . fexofenadine (ALLEGRA) 180 MG tablet Take 180 mg by mouth daily.    . furosemide (LASIX) 20 MG tablet Take 20 mg by mouth daily.    . nitroGLYCERIN (NITROSTAT) 0.4 MG SL tablet Place 1 tablet (0.4 mg total) under the tongue every 5 (five) minutes as needed for chest pain. 25 tablet 3  . pantoprazole (PROTONIX) 40 MG tablet Take 1 tablet (40 mg total) by mouth daily. Please keep upcoming appt in August for future refills. Thank you 90 tablet 0  . potassium chloride SA (K-DUR,KLOR-CON) 20 MEQ tablet Take 3 tablets (60 mEq total) by mouth daily. 90 tablet 3  . rosuvastatin (CRESTOR) 10 MG tablet Take 1 tablet (10 mg total) by mouth daily. 90 tablet 3  . sotalol (BETAPACE) 160 MG tablet TAKE 1 TABLET BY MOUTH TWICE DAILY 60 tablet 10  . spironolactone (ALDACTONE) 25 MG tablet Take 1 tablet (25 mg total) by mouth daily. 30 tablet 3  . SYNTHROID 125 MCG tablet Take 125 mcg by mouth every morning.   5  . traZODone (DESYREL) 50 MG tablet Take 0.5 tablets (25 mg total) by mouth at bedtime as needed for sleep. (Patient taking differently: Take 25 mg by mouth at bedtime. ) 30 tablet 0  . triamcinolone (NASACORT ALLERGY 24HR) 55 MCG/ACT AERO nasal inhaler Place 1 spray into the nose daily.     Carlena Hurl 20 MG TABS tablet TAKE 1 TABLET EVERY DAY WITH SUPPER 90 tablet 2   Current Facility-Administered Medications on File Prior to Encounter  Medication Dose Route Frequency Provider Last Rate Last Dose  . bivalirudin (ANGIOMAX) 250 mg in sodium chloride 0.9 % 50 mL (5 mg/mL) infusion    Continuous PRN Kathleene Hazel, MD   Stopped at 09/20/15 [phone]  . bivalirudin (ANGIOMAX) BOLUS via infusion    PRN Kathleene Hazel, MD   54.45 mg at 09/20/15 9604  . fentaNYL  (SUBLIMAZE) injection    PRN Kathleene Hazel, MD   25 mcg at 09/20/15 0651  . heparin 1,500 mL    PRN Verne Carrow D, MD      . heparin infusion 2 units/mL in 0.9 % sodium chloride    Continuous PRN Kathleene Hazel, MD   1,500 mL at 09/20/15 0733  . iopamidol (ISOVUE-370) 76 % injection    PRN Kathleene Hazel, MD   165 mL at 09/20/15 0733  . lidocaine (PF) (XYLOCAINE) 1 % injection    PRN Kathleene Hazel, MD   2 mL at 09/20/15 5409  . midazolam (VERSED) injection    PRN Kathleene Hazel, MD   1 mg at 09/20/15 0651  . nitroGLYCERIN 1 mg/10 ml (100 mcg/ml) - IR/CATH  LAB    PRN Kathleene Hazel, MD   200 mcg at 09/20/15 4097  . Radial Cocktail/Verapamil only    PRN Kathleene Hazel, MD   10 mL at 09/20/15 0629  . ticagrelor (BRILINTA) tablet    PRN Kathleene Hazel, MD   180 mg at 09/20/15 [phone]  . tirofiban (AGGRASTAT) bolus via infusion    PRN Kathleene Hazel, MD   1,815 mcg at 09/20/15 [phone]  . tirofiban (AGGRASTAT) infusion 50 mcg/mL 100 mL    Continuous PRN Kathleene Hazel, MD 13.1 mL/hr at 09/20/15 0651 0.15 mcg/kg/min at 09/20/15 0651    BP 102/62   Pulse (!) 54   Wt 67.2 kg (148 lb 3.2 oz)   SpO2 96%   BMI 21.89 kg/m  General: NAD Neck: No JVD, no thyromegaly or thyroid nodule.  Lungs: Clear to auscultation bilaterally with normal respiratory effort. CV: Nondisplaced PMI.  Heart regular S1/S2, no S3/S4, 2/6 HSM apex.  No peripheral edema.  No carotid bruit.  Normal pedal pulses.  Abdomen: Soft, nontender, no hepatosplenomegaly, no distention.  Skin: Intact without lesions or rashes.  Neurologic: Alert and oriented x 3.  Psych: Normal affect. Extremities: No clubbing or cyanosis.  HEENT: Normal.   Assessment/Plan: 1. CAD: No ischemic-type chest pain.  S/p anterior MI in 7/17 with DES to RCA.   - She is off Plavix now and taking only Xarelto 20 mg daily.  - She is tolerating Crestor without  myalgias.  Check lipids today.     2. Chronic systolic CHF: Ischemic cardiomyopathy.  EF 25% on echo in 10/17, 30-35% on echo in 11/17, 30% on TEE in 8/18. She has extensive LAD-territory scar. 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  Echo was done today and reviewed, EF remains 30% with LAD-territory scar.  She is not volume overloaded on exam or by Corevue. NYHA class II symptoms, less fatigued with sotalol, likely due to suppression of PVCs.   - Off Coreg with low BP and addition of sotalol.    - Increase losartan to 25 mg qhs and check BMET today and again in 10 days.    - Continue Lasix 20 mg daily.    - Continue spironolactone 25 daily.  3. Mitral regurgitation: Moderate to severe on 10/17 echo, severe on 11/17 echo.  I did a TEE in 8/18.  This showed moderate MR that appeared to be functional.  No indication for surgery or Mitraclip. Echo today showed moderate MR.  4. Atrial fibrillation: Unable to tolerate amiodarone due to GI side effects.  She is in NSR today on Xarelto 20 mg daily and sotalol. QTc interval is acceptable on sotalol.   5. PVCs: Very frequent on 12/18 holter (41% beats), she has had a PVC ablation in 2/19. Due to recurrence of PVCs post-ablation, sotalol was increased to 160 mg bid. She does not seem to have as many PVCs now and overall feels considerably better.  - Continue sotalol, QTc ok on ECG today.  6. Hyperlipidemia: Tolerating Crestor, check lipids today.   Followup in 3 months.   Marca Ancona 10/06/2017

## 2017-10-13 ENCOUNTER — Other Ambulatory Visit (HOSPITAL_COMMUNITY): Payer: Self-pay

## 2017-10-13 MED ORDER — SPIRONOLACTONE 25 MG PO TABS: 25.0000 mg | ORAL_TABLET | Freq: Every day | ORAL | 3 refills | Status: DC

## 2017-10-15 ENCOUNTER — Ambulatory Visit: Payer: BLUE CROSS/BLUE SHIELD | Admitting: Cardiovascular Disease

## 2017-10-15 ENCOUNTER — Encounter: Payer: Self-pay | Admitting: Cardiovascular Disease

## 2017-10-15 VITALS — BP 90/70 | HR 54 | Ht 69.0 in | Wt 149.8 lb

## 2017-10-15 DIAGNOSIS — I5022 Chronic systolic (congestive) heart failure: Secondary | ICD-10-CM

## 2017-10-15 DIAGNOSIS — I2510 Atherosclerotic heart disease of native coronary artery without angina pectoris: Secondary | ICD-10-CM

## 2017-10-15 DIAGNOSIS — I255 Ischemic cardiomyopathy: Secondary | ICD-10-CM

## 2017-10-15 DIAGNOSIS — I340 Nonrheumatic mitral (valve) insufficiency: Secondary | ICD-10-CM

## 2017-10-15 DIAGNOSIS — I493 Ventricular premature depolarization: Secondary | ICD-10-CM

## 2017-10-15 LAB — BASIC METABOLIC PANEL
BUN/Creatinine Ratio: 20 (ref 9–23)
BUN: 22 mg/dL (ref 6–24)
CO2: 25 mmol/L (ref 20–29)
Calcium: 9.3 mg/dL (ref 8.7–10.2)
Chloride: 99 mmol/L (ref 96–106)
Creatinine, Ser: 1.09 mg/dL — ABNORMAL HIGH (ref 0.57–1.00)
GFR calc Af Amer: 68 mL/min/{1.73_m2} (ref >59)
GFR calc non Af Amer: 59 mL/min/{1.73_m2} — ABNORMAL LOW (ref >59)
Glucose: 82 mg/dL (ref 65–99)
Potassium: 4.4 mmol/L (ref 3.5–5.2)
Sodium: 141 mmol/L (ref 134–144)

## 2017-10-15 NOTE — Patient Instructions (Signed)
Medication Instructions:  Your physician recommends that you continue on your current medications as directed. Please refer to the Current Medication list given to you today.   Labwork: Lab work to be done today--BMP  Testing/Procedures: none  Follow-Up: Your physician recommends that you schedule a follow-up appointment in: 12 months. Please call our office in about 8 months to schedule this appointment    Any Other Special Instructions Will Be Listed Below (If Applicable).     If you need a refill on your cardiac medications before your next appointment, please call your pharmacy.

## 2017-10-15 NOTE — Progress Notes (Signed)
Chief Complaint  Patient presents with  . Follow-up    CAD    History of Present Illness: 52 yo female with history of CAD with anterior STEMI in July 2017, ischemic cardiomyopathy, chronic systolic CHF, paroxysmal atrial fib, severe mitral regurgitation here today for follow up. She presented to the [facility] ED 09/19/15 with chest pain and workup was delayed leading to late recognition of her MI. Cardiac cath 08/2715 with occluded mid LAD treated with a single drug eluting stent. She was found to have severe LV systolic dysfunction with LVEF around 30% immediately post cath. Continued chest pain and relook cath 09/21/15 demonstrated patency of the LAD stent. Her hospitalization was complicated by atrial fibrillation. She has been on Xarelto. She did not tolerate amiodarone. Echo November 2017 with LVEF 30-35%, severe MR. ICD implanted November 2017. She has been followed in the advanced CHF clinic.TEE August 2018 showed LVEF=30% with moderate MR. She was found to have frequent PVCs on her device and Holter December 2018 showed 41% PVCs. She was started on Sotalol and Coreg was stopped. She had a PVC ablation in February 2019 but the PVCs returned. Echo August 2019 with LVEF=30% with normal RV size and function, moderate MR. She was seen in the Advanced Heart Failure Clinic last week by Dr. Shirlee Latch.   She is here today for follow up. The patient denies any chest pain, dyspnea, palpitations, lower extremity edema, orthopnea, PND, dizziness, near syncope or syncope.   Primary Care Physician: Benita Stabile, MD   Past Medical History:  Diagnosis Date  . AICD (automatic cardioverter/defibrillator) present    St. Jude  . CAD in native artery   . Chronic systolic CHF (congestive heart failure) (HCC)   . Hypotension    a. preventing med titration for HF.  Marland Kitchen Hypothyroidism 09/24/2015  . Ischemic cardiomyopathy   . PAF (paroxysmal atrial fibrillation) (HCC) 09/24/2015   a. dx at time of STEMI  08/2015.  . Pre-diabetes   . Presence of permanent cardiac pacemaker   . PVC's (premature ventricular contractions)     Past Surgical History:  Procedure Laterality Date  . CARDIAC CATHETERIZATION N/A 09/20/2015   Procedure: Left Heart Cath and Coronary Angiography;  Surgeon: Kathleene Hazel, MD;  Location: [facility] INVASIVE CV LAB;  Service: Cardiovascular;  Laterality: N/A;  . CARDIAC CATHETERIZATION N/A 09/20/2015   Procedure: Coronary Stent Intervention;  Surgeon: Kathleene Hazel, MD;  Location: [facility] INVASIVE CV LAB;  Service: Cardiovascular;  Laterality: N/A;  . CARDIAC CATHETERIZATION N/A 09/21/2015   Procedure: Left Heart Cath and Coronary Angiography;  Surgeon: Tonny Bollman, MD;  Location: [facility] INVASIVE CV LAB;  Service: Cardiovascular;  Laterality: N/A;  . CARDIAC CATHETERIZATION N/A 11/26/2015   Procedure: Right Heart Cath;  Surgeon: [name], MD;  Location: [facility] INVASIVE CV LAB;  Service: Cardiovascular;  Laterality: N/A;  . CORONARY STENT PLACEMENT  09/20/2015  . EP IMPLANTABLE DEVICE N/A 01/23/2016   Procedure: ICD Implant;  Surgeon: Will Jorja Loa, MD;  Location: [facility] INVASIVE CV LAB;  Service: Cardiovascular;  Laterality: N/A;  . PVC ABLATION N/A 04/02/2017   Procedure: PVC ABLATION;  Surgeon: Regan Lemming, MD;  Location: [facility] INVASIVE CV LAB;  Service: Cardiovascular;  Laterality: N/A;  . TEE WITHOUT CARDIOVERSION N/A 10/08/2016   Procedure: TRANSESOPHAGEAL ECHOCARDIOGRAM (TEE);  Surgeon: [name], MD;  Location: [facility] ENDOSCOPY;  Service: Cardiovascular;  Laterality: N/A;  . THYROIDECTOMY      Home Meds:   Current Outpatient Medications:  .  Calcium Carb-Cholecalciferol (CALCIUM + D3 PO), Take 1 tablet by mouth every morning., Disp: , Rfl:  .  Coenzyme Q10 (COQ10) 200 MG CAPS, Take 200 mg by mouth daily., Disp: , Rfl:  .  fexofenadine (ALLEGRA) 180 MG tablet, Take 180 mg by mouth daily., Disp: , Rfl:  .  furosemide (LASIX) 20 MG tablet, Take 20 mg by  mouth daily., Disp: , Rfl:  .  losartan (COZAAR) 25 MG tablet, Take 1 tablet (25 mg total) by mouth at bedtime., Disp: 90 tablet, Rfl: 3 .  nitroGLYCERIN (NITROSTAT) 0.4 MG SL tablet, Place 1 tablet (0.4 mg total) under the tongue every 5 (five) minutes as needed for chest pain., Disp: 25 tablet, Rfl: 3 .  pantoprazole (PROTONIX) 40 MG tablet, Take 1 tablet (40 mg total) by mouth daily. Please keep upcoming appt in August for future refills. Thank you, Disp: 90 tablet, Rfl: 0 .  potassium chloride SA (K-DUR,KLOR-CON) 20 MEQ tablet, Take 3 tablets (60 mEq total) by mouth daily., Disp: 90 tablet, Rfl: 3 .  rosuvastatin (CRESTOR) 10 MG tablet, Take 1 tablet (10 mg total) by mouth daily., Disp: 90 tablet, Rfl: 3 .  sotalol (BETAPACE) 160 MG tablet, TAKE 1 TABLET BY MOUTH TWICE DAILY, Disp: 60 tablet, Rfl: 10 .  spironolactone (ALDACTONE) 25 MG tablet, Take 1 tablet (25 mg total) by mouth daily., Disp: 30 tablet, Rfl: 3 .  SYNTHROID 125 MCG tablet, Take 125 mcg by mouth every morning. , Disp: , Rfl: 5 .  traZODone (DESYREL) 50 MG tablet, Take 0.5 tablets (25 mg total) by mouth at bedtime as needed for sleep. (Patient taking differently: Take 25 mg by mouth at bedtime. ), Disp: 30 tablet, Rfl: 0 .  triamcinolone (NASACORT ALLERGY 24HR) 55 MCG/ACT AERO nasal inhaler, Place 1 spray into the nose daily. , Disp: , Rfl:  .  XARELTO 20 MG TABS tablet, TAKE 1 TABLET EVERY DAY WITH SUPPER, Disp: 90 tablet, Rfl: 2 No current facility-administered medications for this visit.   Facility-Administered Medications Ordered in Other Visits:  .  bivalirudin (ANGIOMAX) 250 mg in sodium chloride 0.9 % 50 mL (5 mg/mL) infusion, , , Continuous PRN, Kathleene Hazel, MD, Stopped at 09/20/15 [phone] .  bivalirudin (ANGIOMAX) BOLUS via infusion, , , PRN, Kathleene Hazel, MD, 54.45 mg at 09/20/15 [phone] .  fentaNYL (SUBLIMAZE) injection, , , PRN, Kathleene Hazel, MD, 25 mcg at 09/20/15 0651 .  heparin 1,500 mL,  , , PRN, Verne Carrow D, MD .  heparin infusion 2 units/mL in 0.9 % sodium chloride, , , Continuous PRN, Kathleene Hazel, MD, 1,500 mL at 09/20/15 0733 .  iopamidol (ISOVUE-370) 76 % injection, , , PRN, Kathleene Hazel, MD, 165 mL at 09/20/15 0733 .  lidocaine (PF) (XYLOCAINE) 1 % injection, , , PRN, Kathleene Hazel, MD, 2 mL at 09/20/15 [phone] .  midazolam (VERSED) injection, , , PRN, Kathleene Hazel, MD, 1 mg at 09/20/15 [phone] .  nitroGLYCERIN 1 mg/10 ml (100 mcg/ml) - IR/CATH LAB, , , PRN, Kathleene Hazel, MD, 200 mcg at 09/20/15 1478 .  Radial Cocktail/Verapamil only, , , PRN, Kathleene Hazel, MD, 10 mL at 09/20/15 0629 .  ticagrelor (BRILINTA) tablet, , , PRN, Kathleene Hazel, MD, 180 mg at 09/20/15 [phone] .  tirofiban (AGGRASTAT) bolus via infusion, , , PRN, Kathleene Hazel, MD, 1,815 mcg at 09/20/15 [phone] .  tirofiban (AGGRASTAT) infusion 50 mcg/mL 100 mL, , , Continuous PRN, Verne Carrow  D, MD, Last Rate: 13.1 mL/hr at 09/20/15 0651, 0.15 mcg/kg/min at 09/20/15 0651  Allergies  Allergen Reactions  . Paxil [Paroxetine] Palpitations  . Morphine And Related Nausea And Vomiting  . Percocet [Oxycodone-Acetaminophen] Nausea And Vomiting  . Cefdinir Nausea Only  . Zolpidem Other (See Comments)    Doesn't work for patient at all    Social History   Socioeconomic History  . Marital status: Married    Spouse name: Not on file  . Number of children: Not on file  . Years of education: Not on file  . Highest education level: Not on file  Occupational History  . Not on file  Social Needs  . Financial resource strain: Not on file  . Food insecurity:    Worry: Not on file    Inability: Not on file  . Transportation needs:    Medical: Not on file    Non-medical: Not on file  Tobacco Use  . Smoking status: Never Smoker  . Smokeless tobacco: Never Used  Substance and Sexual Activity  . Alcohol use: No  . Drug  use: No  . Sexual activity: Not on file  Lifestyle  . Physical activity:    Days per week: Not on file    Minutes per session: Not on file  . Stress: Not on file  Relationships  . Social connections:    Talks on phone: Not on file    Gets together: Not on file    Attends religious service: Not on file    Active member of club or organization: Not on file    Attends meetings of clubs or organizations: Not on file    Relationship status: Not on file  . Intimate partner violence:    Fear of current or ex partner: Not on file    Emotionally abused: Not on file    Physically abused: Not on file    Forced sexual activity: Not on file  Other Topics Concern  . Not on file  Social History Narrative  . Not on file    Family History  Problem Relation Age of Onset  . Heart attack Father   . Arrhythmia Mother     Review of Systems:  As stated in the HPI and otherwise negative.   BP 90/70   Pulse (!) 54   Ht 5\' 9"  (1.753 m)   Wt 149 lb 12.8 oz (67.9 kg)   SpO2 100%   BMI 22.12 kg/m   Physical Examination:  General: Well developed, well nourished, NAD  HEENT: OP clear, mucus membranes moist  SKIN: warm, dry. No rashes. Neuro: No focal deficits  Musculoskeletal: Muscle strength 5/5 all ext  Psychiatric: Mood and affect normal  Neck: No JVD, no carotid bruits, no thyromegaly, no lymphadenopathy.  Lungs:Clear bilaterally, no wheezes, rhonci, crackles Cardiovascular: Regular rate and rhythm. Systolic murmur noted.  Abdomen:Soft. Bowel sounds present. Non-tender.  Extremities: No lower extremity edema. Pulses are 2 + in the bilateral DP/PT.  Echo August 2019: Left ventricle: The cavity size was mildly dilated. Wall   thickness was normal. The estimated ejection fraction was 30%.   Akinesis of the inferoseptum and anteroseptum. Apical inferior   and apical anterior akinesis. Akinesis of the apex. Features are   consistent with a pseudonormal left ventricular filling pattern,    with concomitant abnormal relaxation and increased filling   pressure (grade 2 diastolic dysfunction). - Aortic valve: There was no stenosis. - Mitral valve: There was moderate regurgitation. - Left atrium:  The atrium was moderately dilated. - Right ventricle: The cavity size was normal. Pacer wire or   catheter noted in right ventricle. Systolic function was normal. - Tricuspid valve: Peak RV-RA gradient (S): 34 mm Hg. - Pulmonary arteries: PA peak pressure: 37 mm Hg (S). - Inferior vena cava: The vessel was normal in size. The   respirophasic diameter changes were in the normal range (>= 50%),   consistent with normal central venous pressure.  Impressions:  - Mildly dilated LV with EF 30%. Wall motion abnormalities as   above. Normal RV size and systolic function. Moderate MR. Mild   pulmonary hypertension.  EKG:  EKG is not ordered today. The ekg ordered today demonstrates   Recent Labs: 02/06/2017: Magnesium 2.1 10/05/2017: BUN 20; Creatinine, Ser 1.02; Hemoglobin 12.4; Platelets 152; Potassium 4.6; Sodium 139   Lipid Panel    Component Value Date/Time   CHOL 137 10/05/2017 1236   TRIG 140 10/05/2017 1236   HDL 57 10/05/2017 1236   CHOLHDL 2.4 10/05/2017 1236   VLDL 28 10/05/2017 1236   LDLCALC 52 10/05/2017 1236     Wt Readings from Last 3 Encounters:  10/15/17 149 lb 12.8 oz (67.9 kg)  10/05/17 148 lb 3.2 oz (67.2 kg)  08/14/17 147 lb (66.7 kg)     Assessment and Plan:    1. CAD without angina: She is known to have CAD with anterior MI in July 2017 with placement of a single DES in the mid LAD. She had prolonged ischemic time and now has LV dysfunction. Will continue statin. She is on sotalol for PVCs.     2. PAF/PVCs: Sinus today. Continue sotalol and Xarelto. She is followed by Dr. Raul Del in the EP clinic. Of note, she did not tolerate amiodarone.    3. Chronic systolic CHF/Ischemic cardiomyopathy: Volume status is ok today. Weight is stable. LVEF 30% by  echo August 2019. ICD in place. Will continue Losartan, aldactone. She is on sotalol. She is followed in the advanced heart failure clinic.   Her Cozaar was increased in the HF clinic last week. Will check BMET today  4. Mitral regurgitation: Moderate by echo August 2019.   Follow up with me in one year. She will continue to follow in the device clinic and in the advanced heart failure clinic.   Signed, Verne Carrow, MD 10/15/2017 10:42 AM    [facility] [address] [place], [place], [place]  [address] Phone: [phone]; Fax: [phone]

## 2017-10-19 ENCOUNTER — Ambulatory Visit: Payer: BLUE CROSS/BLUE SHIELD | Admitting: Gastroenterology

## 2017-10-19 ENCOUNTER — Encounter: Payer: Self-pay | Admitting: Gastroenterology

## 2017-10-19 DIAGNOSIS — Z1211 Encounter for screening for malignant neoplasm of colon: Secondary | ICD-10-CM | POA: Diagnosis not present

## 2017-10-19 NOTE — Assessment & Plan Note (Addendum)
52 year old pleasant female with need for initial screening colonoscopy; she is on Xarelto chronically with a history of afib and prior extensive cardiac history after STEMI in 2017. Stable from a cardiac standpoint. Notes rare low-volume hematochezia if diarrhea but no chronic rectal bleeding. No other concerning lower or upper GI symptoms.   Proceed with TCS with Dr. Jena Gauss in near future: the risks, benefits, and alternatives have been discussed with the patient in detail. The patient states understanding and desires to proceed. Propofol  Will request to hold Xarelto 48 hours prior: addendum, cardiology aware. Patient aware. Xarelto on hold 48 hours prior to colonoscopy.

## 2017-10-19 NOTE — Patient Instructions (Signed)
We will ask to hold the Xarelto for 2 days prior to the colonoscopy.  We have arranged a colonoscopy with Dr. Jena Gauss in the near future.  Further recommendations to follow!  It was a pleasure to see you today. I strive to create trusting relationships with patients to provide genuine, compassionate, and quality care. I value your feedback. If you receive a survey regarding your visit,  I greatly appreciate you taking time to fill this out.   Gelene Mink, PhD, ANP-BC [facility]

## 2017-10-19 NOTE — Progress Notes (Signed)
Primary Care Physician:  Benita Stabile, MD Primary Gastroenterologist:  Dr. Jena Gauss   Chief Complaint  Patient presents with  . Colonoscopy    never had tcs    HPI:   Catherine Mays is a 52 y.o. female presenting today at the request of Dr., Margo Aye secondary to need for screening colonoscopy. She is on Xarelto chronically with a history of afib. Prior anterior STEMI in July 2017, ischemic cardiomyopathy, chronic systolic CHF, severe mitral regurgitation. She is followed by Dr. Clifton James, Dr. Shirlee Latch, and Dr. Elberta Fortis. She has an ICD that was placed Nov 2017. She has recently been seen by cardiology, heart failure clinic, and electrophysiologist.    Has seen rare scant hematochezia if an episode of diarrhea. Wonders if she has a hemorrhoid at times. Has intermittent rectal itching/burning but not currently. Denies chronic diarrhea. No abdominal pain, N/V. No upper GI symptoms. Overall, no significant change in bowel habits. States diarrhea is not frequent.   Past Medical History:  Diagnosis Date  . AICD (automatic cardioverter/defibrillator) present    St. Jude  . CAD in native artery   . Chronic systolic CHF (congestive heart failure) (HCC)   . Hypotension    a. preventing med titration for HF.  Marland Kitchen Hypothyroidism 09/24/2015  . Ischemic cardiomyopathy   . PAF (paroxysmal atrial fibrillation) (HCC) 09/24/2015   a. dx at time of STEMI 08/2015.  . Pre-diabetes   . Presence of permanent cardiac pacemaker   . PVC's (premature ventricular contractions)     Past Surgical History:  Procedure Laterality Date  . CARDIAC CATHETERIZATION N/A 09/20/2015   Procedure: Left Heart Cath and Coronary Angiography;  Surgeon: Kathleene Hazel, MD;  Location: [facility] INVASIVE CV LAB;  Service: Cardiovascular;  Laterality: N/A;  . CARDIAC CATHETERIZATION N/A 09/20/2015   Procedure: Coronary Stent Intervention;  Surgeon: Kathleene Hazel, MD;  Location: [facility] INVASIVE CV LAB;  Service: Cardiovascular;   Laterality: N/A;  . CARDIAC CATHETERIZATION N/A 09/21/2015   Procedure: Left Heart Cath and Coronary Angiography;  Surgeon: Tonny Bollman, MD;  Location: [facility] INVASIVE CV LAB;  Service: Cardiovascular;  Laterality: N/A;  . CARDIAC CATHETERIZATION N/A 11/26/2015   Procedure: Right Heart Cath;  Surgeon: [name], MD;  Location: [facility] INVASIVE CV LAB;  Service: Cardiovascular;  Laterality: N/A;  . CORONARY STENT PLACEMENT  09/20/2015  . EP IMPLANTABLE DEVICE N/A 01/23/2016   Procedure: ICD Implant;  Surgeon: Will Jorja Loa, MD;  Location: [facility] INVASIVE CV LAB;  Service: Cardiovascular;  Laterality: N/A;  . PVC ABLATION N/A 04/02/2017   Procedure: PVC ABLATION;  Surgeon: Regan Lemming, MD;  Location: [facility] INVASIVE CV LAB;  Service: Cardiovascular;  Laterality: N/A;  . TEE WITHOUT CARDIOVERSION N/A 10/08/2016   Procedure: TRANSESOPHAGEAL ECHOCARDIOGRAM (TEE);  Surgeon: [name], MD;  Location: [facility] ENDOSCOPY;  Service: Cardiovascular;  Laterality: N/A;  . THYROIDECTOMY      Current Outpatient Medications  Medication Sig Dispense Refill  . Calcium Carb-Cholecalciferol (CALCIUM + D3 PO) Take 1 tablet by mouth every morning.    . Coenzyme Q10 (COQ10) 200 MG CAPS Take 200 mg by mouth daily.    . fexofenadine (ALLEGRA) 180 MG tablet Take 180 mg by mouth daily.    . furosemide (LASIX) 20 MG tablet Take 20 mg by mouth daily.    Marland Kitchen losartan (COZAAR) 25 MG tablet Take 1 tablet (25 mg total) by mouth at bedtime. 90 tablet 3  . nitroGLYCERIN (NITROSTAT) 0.4 MG SL tablet Place 1 tablet (  0.4 mg total) under the tongue every 5 (five) minutes as needed for chest pain. 25 tablet 3  . pantoprazole (PROTONIX) 40 MG tablet Take 1 tablet (40 mg total) by mouth daily. Please keep upcoming appt in August for future refills. Thank you 90 tablet 0  . potassium chloride SA (K-DUR,KLOR-CON) 20 MEQ tablet Take 3 tablets (60 mEq total) by mouth daily. (Patient taking differently: Take 40 mEq by mouth daily. ) 90  tablet 3  . rosuvastatin (CRESTOR) 10 MG tablet Take 1 tablet (10 mg total) by mouth daily. 90 tablet 3  . sotalol (BETAPACE) 160 MG tablet TAKE 1 TABLET BY MOUTH TWICE DAILY 60 tablet 10  . spironolactone (ALDACTONE) 25 MG tablet Take 1 tablet (25 mg total) by mouth daily. 30 tablet 3  . SYNTHROID 125 MCG tablet Take 125 mcg by mouth every morning.   5  . traZODone (DESYREL) 50 MG tablet Take 0.5 tablets (25 mg total) by mouth at bedtime as needed for sleep. (Patient taking differently: Take 25 mg by mouth at bedtime. ) 30 tablet 0  . triamcinolone (NASACORT ALLERGY 24HR) 55 MCG/ACT AERO nasal inhaler Place 1 spray into the nose daily.     Carlena Hurl 20 MG TABS tablet TAKE 1 TABLET EVERY DAY WITH SUPPER 90 tablet 2      Allergies as of 10/19/2017 - Review Complete 10/19/2017  Allergen Reaction Noted  . Paxil [paroxetine] Palpitations 11/23/2015  . Morphine and related Nausea And Vomiting 11/23/2015  . Percocet [oxycodone-acetaminophen] Nausea And Vomiting 11/23/2015  . Cefdinir Nausea Only 11/23/2015  . Zolpidem Other (See Comments) 11/23/2015    Family History  Problem Relation Age of Onset  . Heart attack Father   . Arrhythmia Mother   . Colon cancer Neg Hx   . Colon polyps Neg Hx     Social History   Socioeconomic History  . Marital status: Married    Spouse name: Not on file  . Number of children: Not on file  . Years of education: Not on file  . Highest education level: Not on file  Occupational History  . Not on file  Social Needs  . Financial resource strain: Not on file  . Food insecurity:    Worry: Not on file    Inability: Not on file  . Transportation needs:    Medical: Not on file    Non-medical: Not on file  Tobacco Use  . Smoking status: Never Smoker  . Smokeless tobacco: Never Used  Substance and Sexual Activity  . Alcohol use: No  . Drug use: No  . Sexual activity: Not on file  Lifestyle  . Physical activity:    Days per week: Not on file     Minutes per session: Not on file  . Stress: Not on file  Relationships  . Social connections:    Talks on phone: Not on file    Gets together: Not on file    Attends religious service: Not on file    Active member of club or organization: Not on file    Attends meetings of clubs or organizations: Not on file    Relationship status: Not on file  . Intimate partner violence:    Fear of current or ex partner: Not on file    Emotionally abused: Not on file    Physically abused: Not on file    Forced sexual activity: Not on file  Other Topics Concern  . Not on file  Social History  Narrative  . Not on file    Review of Systems: Gen: Denies any fever, chills, fatigue, weight loss, lack of appetite.  CV: Denies chest pain, heart palpitations, peripheral edema, syncope.  Resp: Denies shortness of breath at rest or with exertion. Denies wheezing or cough.  GI: see HPI . GU : Denies urinary burning, urinary frequency, urinary hesitancy MS: Denies joint pain, muscle weakness, cramps, or limitation of movement.  Derm: Denies rash, itching, dry skin Psych: Denies depression, anxiety, memory loss, and confusion Heme: see HPI   Physical Exam: BP 99/65   Pulse (!) 41   Temp (!) 97 F (36.1 C) (Oral)   Ht 5\' 9"  (1.753 m)   Wt 147 lb 12.8 oz (67 kg)   BMI 21.83 kg/m  General:   Alert and oriented. Pleasant and cooperative. Well-nourished and well-developed.  Head:  Normocephalic and atraumatic. Eyes:  Without icterus, sclera clear and conjunctiva pink.  Ears:  Normal auditory acuity. Nose:  No deformity, discharge,  or lesions. Mouth:  No deformity or lesions, oral mucosa pink.  Lungs:  Clear to auscultation bilaterally.  Heart:  S1, S2 present with systolic murmur  Abdomen:  +BS, soft, non-tender and non-distended. No HSM noted. No guarding or rebound. No masses appreciated.  Rectal:  Deferred  Msk:  Symmetrical without gross deformities. Normal posture. Extremities:  Without  edema. Neurologic:  Alert and  oriented x4 Psych:  Alert and cooperative. Normal mood and affect.

## 2017-10-19 NOTE — Progress Notes (Signed)
cc'd to pcp 

## 2017-10-20 ENCOUNTER — Telehealth: Payer: Self-pay

## 2017-10-20 ENCOUNTER — Other Ambulatory Visit: Payer: Self-pay | Admitting: *Deleted

## 2017-10-20 DIAGNOSIS — Z1211 Encounter for screening for malignant neoplasm of colon: Secondary | ICD-10-CM

## 2017-10-20 MED ORDER — PEG 3350-KCL-NA BICARB-NACL 420 G PO SOLR: 4000.0000 mL | Freq: Once | ORAL | 0 refills | Status: AC

## 2017-10-20 NOTE — Telephone Encounter (Signed)
Spoke with patient and she is scheduled for 11/23/17 at 8:45am with RMR. Prep sent into the pharmacy. Instructions mailed to her. Order entered. Will call back with pre-op appt.

## 2017-10-20 NOTE — Telephone Encounter (Signed)
Dr. Shirlee Latch, Lewie Loron, NP would like to schedule pts colonoscopy with Dr. Jena Gauss. Is it ok to hold Xarelto 48 hours prior to procedure? Please advise.

## 2017-10-20 NOTE — Telephone Encounter (Signed)
Noted, routing message to [facility] and [facility] Clinical Pool

## 2017-10-20 NOTE — Telephone Encounter (Signed)
OK to hold

## 2017-10-21 ENCOUNTER — Telehealth: Payer: Self-pay | Admitting: *Deleted

## 2017-10-21 NOTE — Telephone Encounter (Signed)
Called anthem and no PA is required for TCS.

## 2017-10-21 NOTE — Telephone Encounter (Signed)
Called patient and is aware pre-op scheduled for 11/18/17 at 11:00am. Letter mailed

## 2017-10-21 NOTE — Telephone Encounter (Signed)
Noted. Thanks everyone.

## 2017-10-24 LAB — CUP PACEART REMOTE DEVICE CHECK
Battery Remaining Longevity: 88 mo
Battery Remaining Percentage: 83 %
Battery Voltage: 3.01 V
Brady Statistic RV Percent Paced: 1 %
Date Time Interrogation Session: 20190730060015
HighPow Impedance: 78 Ohm
HighPow Impedance: 78 Ohm
Implantable Lead Implant Date: 20171129
Implantable Lead Location: 753860
Implantable Pulse Generator Implant Date: 20171129
Lead Channel Impedance Value: 430 Ohm
Lead Channel Pacing Threshold Amplitude: 0.75 V
Lead Channel Pacing Threshold Pulse Width: 0.5 ms
Lead Channel Sensing Intrinsic Amplitude: 12 mV
Lead Channel Setting Pacing Amplitude: 2.5 V
Lead Channel Setting Pacing Pulse Width: 0.5 ms
Lead Channel Setting Sensing Sensitivity: 0.5 mV
Pulse Gen Serial Number: 7377983

## 2017-10-27 ENCOUNTER — Telehealth (HOSPITAL_COMMUNITY): Payer: Self-pay | Admitting: Pharmacist

## 2017-10-27 MED ORDER — POTASSIUM CHLORIDE CRYS ER 20 MEQ PO TBCR: 40.0000 meq | EXTENDED_RELEASE_TABLET | Freq: Every day | ORAL | 5 refills | Status: DC

## 2017-10-27 NOTE — Telephone Encounter (Signed)
Ms. Manwell called stating that her pharmacy only had an Rx for KCl 60 meq daily but she has been taking 40 meq daily for at least a year. Will send new Rx for the 40 meq daily.   Tyler Deis. Bonnye Fava, PharmD, BCPS, CPP Clinical Pharmacist Phone: [phone] 10/27/2017 11:54 AM

## 2017-11-06 ENCOUNTER — Other Ambulatory Visit: Payer: Self-pay | Admitting: Cardiology

## 2017-11-06 NOTE — Telephone Encounter (Signed)
Outpatient Medication Detail    Disp Refills Start End   sotalol (BETAPACE) 160 MG tablet 60 tablet 10 09/01/2017    Sig: TAKE 1 TABLET BY MOUTH TWICE DAILY   Sent to pharmacy as: sotalol (BETAPACE) 160 MG tablet   E-Prescribing Status: Receipt confirmed by pharmacy (09/01/2017 9:16 AM EDT)   Pharmacy   [facility] [phone] - [place], [place] - [address] AT [facility] [address]

## 2017-11-17 ENCOUNTER — Encounter (HOSPITAL_COMMUNITY): Payer: Self-pay

## 2017-11-18 ENCOUNTER — Other Ambulatory Visit (HOSPITAL_COMMUNITY): Payer: BLUE CROSS/BLUE SHIELD

## 2017-11-18 ENCOUNTER — Encounter (HOSPITAL_COMMUNITY)
Admission: RE | Admit: 2017-11-18 | Discharge: 2017-11-18 | Disposition: A | Discharge status: Home / Self Care | Payer: BLUE CROSS/BLUE SHIELD | Source: Ambulatory Visit | Attending: Internal Medicine | Admitting: Internal Medicine

## 2017-11-23 ENCOUNTER — Ambulatory Visit (HOSPITAL_COMMUNITY): Payer: BLUE CROSS/BLUE SHIELD | Admitting: Anesthesiology

## 2017-11-23 ENCOUNTER — Ambulatory Visit (HOSPITAL_COMMUNITY)
Admission: RE | Admit: 2017-11-23 | Discharge: 2017-11-23 | Disposition: A | Discharge status: Home / Self Care | Payer: BLUE CROSS/BLUE SHIELD | Source: Ambulatory Visit | Attending: Internal Medicine | Admitting: Internal Medicine

## 2017-11-23 ENCOUNTER — Encounter (HOSPITAL_COMMUNITY)
Admission: RE | Disposition: A | Discharge status: Home / Self Care | Payer: Self-pay | Source: Ambulatory Visit | Attending: Internal Medicine

## 2017-11-23 ENCOUNTER — Encounter (HOSPITAL_COMMUNITY): Payer: Self-pay | Admitting: *Deleted

## 2017-11-23 DIAGNOSIS — Z1211 Encounter for screening for malignant neoplasm of colon: Secondary | ICD-10-CM | POA: Diagnosis present

## 2017-11-23 DIAGNOSIS — I252 Old myocardial infarction: Secondary | ICD-10-CM | POA: Diagnosis not present

## 2017-11-23 DIAGNOSIS — E039 Hypothyroidism, unspecified: Secondary | ICD-10-CM | POA: Diagnosis not present

## 2017-11-23 DIAGNOSIS — Z79899 Other long term (current) drug therapy: Secondary | ICD-10-CM | POA: Insufficient documentation

## 2017-11-23 DIAGNOSIS — K5730 Diverticulosis of large intestine without perforation or abscess without bleeding: Secondary | ICD-10-CM | POA: Diagnosis not present

## 2017-11-23 DIAGNOSIS — Z95810 Presence of automatic (implantable) cardiac defibrillator: Secondary | ICD-10-CM | POA: Diagnosis not present

## 2017-11-23 DIAGNOSIS — Z7902 Long term (current) use of antithrombotics/antiplatelets: Secondary | ICD-10-CM | POA: Insufficient documentation

## 2017-11-23 DIAGNOSIS — I5022 Chronic systolic (congestive) heart failure: Secondary | ICD-10-CM | POA: Diagnosis not present

## 2017-11-23 DIAGNOSIS — I2510 Atherosclerotic heart disease of native coronary artery without angina pectoris: Secondary | ICD-10-CM | POA: Insufficient documentation

## 2017-11-23 DIAGNOSIS — Z955 Presence of coronary angioplasty implant and graft: Secondary | ICD-10-CM | POA: Diagnosis not present

## 2017-11-23 DIAGNOSIS — R7303 Prediabetes: Secondary | ICD-10-CM | POA: Diagnosis not present

## 2017-11-23 DIAGNOSIS — K648 Other hemorrhoids: Secondary | ICD-10-CM | POA: Insufficient documentation

## 2017-11-23 DIAGNOSIS — I480 Paroxysmal atrial fibrillation: Secondary | ICD-10-CM | POA: Insufficient documentation

## 2017-11-23 HISTORY — PX: COLONOSCOPY WITH PROPOFOL: SHX5780

## 2017-11-23 SURGERY — COLONOSCOPY WITH PROPOFOL
Anesthesia: Monitor Anesthesia Care

## 2017-11-23 MED ORDER — STERILE WATER FOR IRRIGATION IR SOLN
Status: DC | PRN
  Administered 2017-11-23: 100 mL

## 2017-11-23 MED ORDER — CHLORHEXIDINE GLUCONATE CLOTH 2 % EX PADS: 6.0000 | MEDICATED_PAD | Freq: Once | CUTANEOUS | Status: DC

## 2017-11-23 MED ORDER — HYDROCODONE-ACETAMINOPHEN 7.5-325 MG PO TABS: 1.0000 | ORAL_TABLET | Freq: Once | ORAL | Status: DC | PRN

## 2017-11-23 MED ORDER — PROMETHAZINE HCL 25 MG/ML IJ SOLN: 6.2500 mg | INTRAMUSCULAR | Status: DC | PRN

## 2017-11-23 MED ORDER — MEPERIDINE HCL 100 MG/ML IJ SOLN: 6.2500 mg | INTRAMUSCULAR | Status: DC | PRN

## 2017-11-23 MED ORDER — PROPOFOL 10 MG/ML IV BOLUS
INTRAVENOUS | Status: AC
  Filled 2017-11-23: qty 40

## 2017-11-23 MED ORDER — PROPOFOL 10 MG/ML IV BOLUS
INTRAVENOUS | Status: DC | PRN
  Administered 2017-11-23: 20 mg via INTRAVENOUS

## 2017-11-23 MED ORDER — LACTATED RINGERS IV SOLN
INTRAVENOUS | Status: DC
  Administered 2017-11-23: 1000 mL via INTRAVENOUS

## 2017-11-23 MED ORDER — LACTATED RINGERS IV SOLN: INTRAVENOUS | Status: DC

## 2017-11-23 MED ORDER — HYDROMORPHONE HCL 1 MG/ML IJ SOLN: 0.2500 mg | INTRAMUSCULAR | Status: DC | PRN

## 2017-11-23 MED ORDER — PROPOFOL 500 MG/50ML IV EMUL
INTRAVENOUS | Status: DC | PRN
  Administered 2017-11-23: 150 ug/kg/min via INTRAVENOUS

## 2017-11-23 NOTE — H&P (Signed)
@LOGO @   Primary Care Physician:  Benita Stabile, MD Primary Gastroenterologist:  Dr.   Pre-Procedure History & Physical: HPI:  Catherine Mays is a 52 y.o. female is here for a screening colonoscopy.  No bowel symptoms.  No family history of colon cancer.No bowel symptoms. No family history of colon cancer.   Xarelto held 3 days ago.also held 3 days ago.  No GI symptoms. No GI symptoms.  Past Medical History:  Diagnosis Date  . AICD (automatic cardioverter/defibrillator) present    [employer]  . CAD in native artery   . Chronic systolic CHF (congestive heart failure) (HCC)   . Hypotension    a. preventing med titration for HF.  Marland Kitchen Hypothyroidism 09/24/2015  . Ischemic cardiomyopathy   . PAF (paroxysmal atrial fibrillation) (HCC) 09/24/2015   a. dx at time of STEMI 08/2015.  . Pre-diabetes   . Presence of permanent cardiac pacemaker   . PVC's (premature ventricular contractions)     Past Surgical History:  Procedure Laterality Date  . CARDIAC CATHETERIZATION N/A 09/20/2015   Procedure: Left Heart Cath and Coronary Angiography;  Surgeon: Kathleene Hazel, MD;  Location: [facility] INVASIVE CV LAB;  Service: Cardiovascular;  Laterality: N/A;  . CARDIAC CATHETERIZATION N/A 09/20/2015   Procedure: Coronary Stent Intervention;  Surgeon: Kathleene Hazel, MD;  Location: [facility] INVASIVE CV LAB;  Service: Cardiovascular;  Laterality: N/A;  . CARDIAC CATHETERIZATION N/A 09/21/2015   Procedure: Left Heart Cath and Coronary Angiography;  Surgeon: Tonny Bollman, MD;  Location: [facility] INVASIVE CV LAB;  Service: Cardiovascular;  Laterality: N/A;  . CARDIAC CATHETERIZATION N/A 11/26/2015   Procedure: Right Heart Cath;  Surgeon: [name], MD;  Location: [facility] INVASIVE CV LAB;  Service: Cardiovascular;  Laterality: N/A;  . CORONARY STENT PLACEMENT  09/20/2015  . EP IMPLANTABLE DEVICE N/A 01/23/2016   Procedure: ICD Implant;  Surgeon: Will Jorja Loa, MD;  Location: [facility] INVASIVE CV LAB;  Service:  Cardiovascular;  Laterality: N/A;  . PVC ABLATION N/A 04/02/2017   Procedure: PVC ABLATION;  Surgeon: Regan Lemming, MD;  Location: [facility] INVASIVE CV LAB;  Service: Cardiovascular;  Laterality: N/A;  . TEE WITHOUT CARDIOVERSION N/A 10/08/2016   Procedure: TRANSESOPHAGEAL ECHOCARDIOGRAM (TEE);  Surgeon: [name], MD;  Location: [facility] ENDOSCOPY;  Service: Cardiovascular;  Laterality: N/A;  . THYROIDECTOMY      Prior to Admission medications   Medication Sig Start Date End Date Taking? Authorizing Provider  Calcium Carb-Cholecalciferol (CALCIUM + D3 PO) Take 1 tablet by mouth every morning.   Yes [provider]  Coenzyme Q10 (COQ10) 200 MG CAPS Take 200 mg by mouth daily.   Yes [provider]  fexofenadine (ALLEGRA) 180 MG tablet Take 180 mg by mouth daily.   Yes [provider]  furosemide (LASIX) 20 MG tablet Take 20 mg by mouth daily.   Yes [provider]  losartan (COZAAR) 25 MG tablet Take 1 tablet (25 mg total) by mouth at bedtime. 10/05/17  Yes [name], MD  pantoprazole (PROTONIX) 40 MG tablet Take 1 tablet (40 mg total) by mouth daily. Please keep upcoming appt in August for future refills. Thank you 09/11/17  Yes Kathleene Hazel, MD  potassium chloride SA (K-DUR,KLOR-CON) 20 MEQ tablet Take 2 tablets (40 mEq total) by mouth daily. 10/27/17  Yes [name], MD  rosuvastatin (CRESTOR) 10 MG tablet Take 1 tablet (10 mg total) by mouth daily. 09/11/17  Yes [name], MD  sotalol (BETAPACE) 160  MG tablet TAKE 1 TABLET BY MOUTH TWICE DAILY Patient taking differently: Take 160 mg by mouth 2 (two) times daily.  09/01/17  Yes Camnitz, Andree Coss, MD  spironolactone (ALDACTONE) 25 MG tablet Take 1 tablet (25 mg total) by mouth daily. 10/13/17 01/11/18 Yes [name], MD  SYNTHROID 125 MCG tablet Take 125 mcg by mouth every morning.  08/25/15  Yes [provider]  traZODone (DESYREL) 50 MG tablet Take 0.5 tablets (25 mg  total) by mouth at bedtime as needed for sleep. 11/26/15  Yes Clegg, Amy D, NP  triamcinolone (NASACORT ALLERGY 24HR) 55 MCG/ACT AERO nasal inhaler Place 2 sprays into the nose daily.    Yes [provider]  XARELTO 20 MG TABS tablet TAKE 1 TABLET EVERY DAY WITH SUPPER Patient taking differently: Take 20 mg by mouth daily with supper.  09/21/17  Yes Kathleene Hazel, MD  nitroGLYCERIN (NITROSTAT) 0.4 MG SL tablet Place 1 tablet (0.4 mg total) under the tongue every 5 (five) minutes as needed for chest pain. 08/14/17   Regan Lemming, MD    Allergies as of 10/20/2017 - Review Complete 10/19/2017  Allergen Reaction Noted  . Paxil [paroxetine] Palpitations 11/23/2015  . Morphine and related Nausea And Vomiting 11/23/2015  . Percocet [oxycodone-acetaminophen] Nausea And Vomiting 11/23/2015  . Cefdinir Nausea Only 11/23/2015  . Zolpidem Other (See Comments) 11/23/2015    Family History  Problem Relation Age of Onset  . Heart attack Father   . Arrhythmia Mother   . Colon cancer Neg Hx   . Colon polyps Neg Hx     Social History   Socioeconomic History  . Marital status: Married    Spouse name: Not on file  . Number of children: Not on file  . Years of education: Not on file  . Highest education level: Not on file  Occupational History  . Not on file  Social Needs  . Financial resource strain: Not on file  . Food insecurity:    Worry: Not on file    Inability: Not on file  . Transportation needs:    Medical: Not on file    Non-medical: Not on file  Tobacco Use  . Smoking status: Never Smoker  . Smokeless tobacco: Never Used  Substance and Sexual Activity  . Alcohol use: No  . Drug use: No  . Sexual activity: Not on file  Lifestyle  . Physical activity:    Days per week: Not on file    Minutes per session: Not on file  . Stress: Not on file  Relationships  . Social connections:    Talks on phone: Not on file    Gets together: Not on file    Attends  religious service: Not on file    Active member of club or organization: Not on file    Attends meetings of clubs or organizations: Not on file    Relationship status: Not on file  . Intimate partner violence:    Fear of current or ex partner: Not on file    Emotionally abused: Not on file    Physically abused: Not on file    Forced sexual activity: Not on file  Other Topics Concern  . Not on file  Social History Narrative  . Not on file    Review of Systems: See HPI, otherwise negative ROS  Physical Exam: BP 106/72   Pulse (!) 55   Temp 98.3 F (36.8 C) (Oral)   Resp 18  SpO2 98%  General:   Alert,  Well-developed, well-nourished, pleasant and cooperative in NAD Head:  Normocephalic and atraumatic. Lungs:  Clear throughout to auscultation.   No wheezes, crackles, or rhonchi. No acute distress. Heart:  Regular rate and rhythm; no murmurs, clicks, rubs,  or gallops. Abdomen:  Soft, nontender and nondistended. No masses, hepatosplenomegaly or hernias noted. Normal bowel sounds, without guarding, and without rebound.   Impression/Plan: Catherine Mays is now here to undergo a screening colonoscopy.  First ever average risk screening examination.first ever average risk screening examination.  Risks, benefits, limitations, imponderables and alternatives regarding colonoscopy have been reviewed with the patient. Questions have been answered. All parties agreeable.     Notice:  This dictation was prepared with Dragon dictation along with smaller phrase technology. Any transcriptional errors that result from this process are unintentional and may not be corrected upon review.

## 2017-11-23 NOTE — Op Note (Signed)
[facility] Patient Name: Catherine Mays Procedure Date: 11/23/2017 8:27 AM MRN: [id number] Date of Birth: 1965-04-10 Attending MD: Gennette Pac , MD CSN: [id number] Age: 52 Admit Type: Outpatient Procedure:                Colonoscopy Indications:              Screening for colorectal malignant neoplasm Providers:                Gennette Pac, MD, Edrick Kins, RN, Dyann Ruddle Referring MD:             Catalina Pizza, MD Medicines:                Propofol per Anesthesia Complications:            No immediate complications. Estimated Blood Loss:     Estimated blood loss: none. Procedure:                Pre-Anesthesia Assessment:                           - Prior to the procedure, a History and Physical                            was performed, and patient medications and                            allergies were reviewed. The patient's tolerance of                            previous anesthesia was also reviewed. The risks                            and benefits of the procedure and the sedation                            options and risks were discussed with the patient.                            All questions were answered, and informed consent                            was obtained. Prior Anticoagulants: The patient                            last took Xarelto (rivaroxaban) 3 days prior to the                            procedure. ASA Grade Assessment: III - A patient                            with severe systemic disease. After reviewing the  risks and benefits, the patient was deemed in                            satisfactory condition to undergo the procedure.                           After obtaining informed consent, the colonoscope                            was passed under direct vision. Throughout the                            procedure, the patient's blood pressure, pulse, and       oxygen saturations were monitored continuously. The                            CF-HQ190L ([id number]) scope was introduced through                            the and advanced to the the cecum, identified by                            appendiceal orifice and ileocecal valve. The                            colonoscopy was performed without difficulty. The                            patient tolerated the procedure well. The quality                            of the bowel preparation was adequate. Scope In: 8:46:01 AM Scope Out: 8:59:28 AM Scope Withdrawal Time: 0 hours 8 minutes 14 seconds  Total Procedure Duration: 0 hours 13 minutes 27 seconds  Findings:      The perianal and digital rectal examinations were normal.      Internal hemorrhoids were found during retroflexion.      A few small-mouthed diverticula were found in the sigmoid colon.      The exam was otherwise without abnormality on direct and retroflexion       views. Impression:               - Internal hemorrhoids.                           - Diverticulosis in the sigmoid colon.                           - The examination was otherwise normal on direct                            and retroflexion views.                           - No specimens collected. Moderate Sedation:      Moderate (conscious) sedation was  personally administered by an       anesthesia professional. The following parameters were monitored: oxygen       saturation, heart rate, blood pressure, and response to care. Total       physician intraservice time was 18 minutes. Recommendation:           - Patient has a contact number available for                            emergencies. The signs and symptoms of potential                            delayed complications were discussed with the                            patient. Return to normal activities tomorrow.                            Written discharge instructions were provided to the                             patient.                           - Resume previous diet.                           - Continue present medications. Resume Xarelto today                           - Repeat colonoscopy in 10 years for screening                            purposes.                           - Return to GI office PRN. Procedure Code(s):        --- Professional ---                           [phone], Colonoscopy, flexible; diagnostic, including                            collection of specimen(s) by brushing or washing,                            when performed (separate procedure) Diagnosis Code(s):        --- Professional ---                           Z12.11, Encounter for screening for malignant                            neoplasm of colon                           K64.8, Other hemorrhoids  K57.30, Diverticulosis of large intestine without                            perforation or abscess without bleeding CPT copyright 2017 [employer]. All rights reserved. The codes documented in this report are preliminary and upon coder review may  be revised to meet current compliance requirements. Gerrit Friends. Billiejean Schimek, MD Gennette Pac, MD 11/23/2017 9:09:07 AM This report has been signed electronically. Number of Addenda: 0

## 2017-11-23 NOTE — Anesthesia Postprocedure Evaluation (Signed)
Anesthesia Post Note  Patient: Catherine Mays  Procedure(s) Performed: COLONOSCOPY WITH PROPOFOL (N/A )  Patient location during evaluation: PACU Anesthesia Type: MAC Level of consciousness: awake and alert and oriented Pain management: pain level controlled Vital Signs Assessment: post-procedure vital signs reviewed and stable Respiratory status: spontaneous breathing Cardiovascular status: blood pressure returned to baseline and stable Postop Assessment: no apparent nausea or vomiting Anesthetic complications: no     Last Vitals:  Vitals:   11/23/17 0830 11/23/17 0835  BP:    Pulse:    Resp: (!) 22 13  Temp:    SpO2: 99% 98%    Last Pain:  Vitals:   11/23/17 0901  TempSrc:   PainSc: 0-No pain                 Katrinka Herbison

## 2017-11-23 NOTE — Transfer of Care (Signed)
Immediate Anesthesia Transfer of Care Note  Patient: Catherine Mays  Procedure(s) Performed: COLONOSCOPY WITH PROPOFOL (N/A )  Patient Location: PACU  Anesthesia Type:MAC  Level of Consciousness: awake  Airway & Oxygen Therapy: Patient Spontanous Breathing  Post-op Assessment: Report given to RN  Post vital signs: Reviewed  Last Vitals:  Vitals Value Taken Time  BP    Temp    Pulse    Resp    SpO2      Last Pain:  Vitals:   11/23/17 0901  TempSrc:   PainSc: 0-No pain      Patients Stated Pain Goal: 5 (80/88/11 0315)  Complications: No apparent anesthesia complications

## 2017-11-23 NOTE — Anesthesia Preprocedure Evaluation (Signed)
Anesthesia Evaluation  Patient identified by MRN, date of birth, ID band Patient awake    Reviewed: Allergy & Precautions, H&P , NPO status , Patient's Chart, lab work & pertinent test results, reviewed documented beta blocker date and time   Airway Mallampati: II  TM Distance: >3 FB Neck ROM: full    Dental no notable dental hx. (+) Teeth Intact, Dental Advidsory Given   Pulmonary neg pulmonary ROS,    Pulmonary exam normal breath sounds clear to auscultation       Cardiovascular Exercise Tolerance: Good + CAD and +CHF  negative cardio ROS  + dysrhythmias Atrial Fibrillation + pacemaker + Cardiac Defibrillator  Rhythm:regular Rate:Normal     Neuro/Psych negative neurological ROS  negative psych ROS   GI/Hepatic negative GI ROS, Neg liver ROS,   Endo/Other  negative endocrine ROSHypothyroidism   Renal/GU negative Renal ROS  negative genitourinary   Musculoskeletal   Abdominal   Peds  Hematology negative hematology ROS (+)   Anesthesia Other Findings Ischemic cardiomyopathy S/P stent 2017, with pacer/AICD, MI age 47 Afib chronic anti-coag  Reproductive/Obstetrics negative OB ROS                             Anesthesia Physical Anesthesia Plan  ASA: III  Anesthesia Plan: MAC   Post-op Pain Management:    Induction:   PONV Risk Score and Plan:   Airway Management Planned:   Additional Equipment:   Intra-op Plan:   Post-operative Plan:   Informed Consent: I have reviewed the patients History and Physical, chart, labs and discussed the procedure including the risks, benefits and alternatives for the proposed anesthesia with the patient or authorized representative who has indicated his/her understanding and acceptance.   Dental Advisory Given  Plan Discussed with: CRNA  Anesthesia Plan Comments:         Anesthesia Quick Evaluation

## 2017-11-23 NOTE — Discharge Instructions (Signed)
Colonoscopy Discharge Instructions  Read the instructions outlined below and refer to this sheet in the next few weeks. These discharge instructions provide you with general information on caring for yourself after you leave the hospital. Your doctor may also give you specific instructions. While your treatment has been planned according to the most current medical practices available, unavoidable complications occasionally occur. If you have any problems or questions after discharge, call Dr. Jena Gauss at [phone]. ACTIVITY  You may resume your regular activity, but move at a slower pace for the next 24 hours.   Take frequent rest periods for the next 24 hours.   Walking will help get rid of the air and reduce the bloated feeling in your belly (abdomen).   No driving for 24 hours (because of the medicine (anesthesia) used during the test).    Do not sign any important legal documents or operate any machinery for 24 hours (because of the anesthesia used during the test).  NUTRITION  Drink plenty of fluids.   You may resume your normal diet as instructed by your doctor.   Begin with a light meal and progress to your normal diet. Heavy or fried foods are harder to digest and may make you feel sick to your stomach (nauseated).   Avoid alcoholic beverages for 24 hours or as instructed.  MEDICATIONS  You may resume your normal medications unless your doctor tells you otherwise.  WHAT YOU CAN EXPECT TODAY  Some feelings of bloating in the abdomen.   Passage of more gas than usual.   Spotting of blood in your stool or on the toilet paper.  IF YOU HAD POLYPS REMOVED DURING THE COLONOSCOPY:  No aspirin products for 7 days or as instructed.   No alcohol for 7 days or as instructed.   Eat a soft diet for the next 24 hours.  FINDING OUT THE RESULTS OF YOUR TEST Not all test results are available during your visit. If your test results are not back during the visit, make an appointment  with your caregiver to find out the results. Do not assume everything is normal if you have not heard from your caregiver or the medical facility. It is important for you to follow up on all of your test results.  SEEK IMMEDIATE MEDICAL ATTENTION IF:  You have more than a spotting of blood in your stool.   Your belly is swollen (abdominal distention).   You are nauseated or vomiting.   You have a temperature over 101.   You have abdominal pain or discomfort that is severe or gets worse throughout the day.    Resume Xarelto today  Diverticulosis information provided  Repeat screening colonoscopy in 10 years      Diverticulosis Diverticulosis is a condition that develops when small pouches (diverticula) form in the wall of the large intestine (colon). The colon is where water is absorbed and stool is formed. The pouches form when the inside layer of the colon pushes through weak spots in the outer layers of the colon. You may have a few pouches or many of them. What are the causes? The cause of this condition is not known. What increases the risk? The following factors may make you more likely to develop this condition:  Being older than age 24. Your risk for this condition increases with age. Diverticulosis is rare among people younger than age 34. By age 1, many people have it.  Eating a low-fiber diet.  Having frequent constipation.  Being  overweight.  Not getting enough exercise.  Smoking.  Taking over-the-counter pain medicines, like aspirin and ibuprofen.  Having a family history of diverticulosis.  What are the signs or symptoms? In most people, there are no symptoms of this condition. If you do have symptoms, they may include:  Bloating.  Cramps in the abdomen.  Constipation or diarrhea.  Pain in the lower left side of the abdomen.  How is this diagnosed? This condition is most often diagnosed during an exam for other colon problems. Because  diverticulosis usually has no symptoms, it often cannot be diagnosed independently. This condition may be diagnosed by:  Using a flexible scope to examine the colon (colonoscopy).  Taking an X-ray of the colon after dye has been put into the colon (barium enema).  Doing a CT scan.  How is this treated? You may not need treatment for this condition if you have never developed an infection related to diverticulosis. If you have had an infection before, treatment may include:  Eating a high-fiber diet. This may include eating more fruits, vegetables, and grains.  Taking a fiber supplement.  Taking a live bacteria supplement (probiotic).  Taking medicine to relax your colon.  Taking antibiotic medicines.  Follow these instructions at home:  Drink 6-8 glasses of water or more each day to prevent constipation.  Try not to strain when you have a bowel movement.  If you have had an infection before: ? Eat more fiber as directed by your health care provider or your diet and nutrition specialist (dietitian). ? Take a fiber supplement or probiotic, if your health care provider approves.  Take over-the-counter and prescription medicines only as told by your health care provider.  If you were prescribed an antibiotic, take it as told by your health care provider. Do not stop taking the antibiotic even if you start to feel better.  Keep all follow-up visits as told by your health care provider. This is important. Contact a health care provider if:  You have pain in your abdomen.  You have bloating.  You have cramps.  You have not had a bowel movement in 3 days. Get help right away if:  Your pain gets worse.  Your bloating becomes very bad.  You have a fever or chills, and your symptoms suddenly get worse.  You vomit.  You have bowel movements that are bloody or black.  You have bleeding from your rectum. Summary  Diverticulosis is a condition that develops when small  pouches (diverticula) form in the wall of the large intestine (colon).  You may have a few pouches or many of them.  This condition is most often diagnosed during an exam for other colon problems.  If you have had an infection related to diverticulosis, treatment may include increasing the fiber in your diet, taking supplements, or taking medicines. This information is not intended to replace advice given to you by your health care provider. Make sure you discuss any questions you have with your health care provider. Document Released: 11/08/2003 Document Revised: 12/31/2015 Document Reviewed: 12/31/2015 Elsevier Interactive Patient Education  2017 [employer]      Monitored Anesthesia Care, Care After These instructions provide you with information about caring for yourself after your procedure. Your health care provider may also give you more specific instructions. Your treatment has been planned according to current medical practices, but problems sometimes occur. Call your health care provider if you have any problems or questions after your procedure. What can I  expect after the procedure? After your procedure, it is common to:  Feel sleepy for several hours.  Feel clumsy and have poor balance for several hours.  Feel forgetful about what happened after the procedure.  Have poor judgment for several hours.  Feel nauseous or vomit.  Have a sore throat if you had a breathing tube during the procedure.  Follow these instructions at home: For at least 24 hours after the procedure:   Do not: ? Participate in activities in which you could fall or become injured. ? Drive. ? Use heavy machinery. ? Drink alcohol. ? Take sleeping pills or medicines that cause drowsiness. ? Make important decisions or sign legal documents. ? Take care of children on your own.  Rest. Eating and drinking  Follow the diet that is recommended by your health care provider.  If you vomit,  drink water, juice, or soup when you can drink without vomiting.  Make sure you have little or no nausea before eating solid foods. General instructions  Have a responsible adult stay with you until you are awake and alert.  Take over-the-counter and prescription medicines only as told by your health care provider.  If you smoke, do not smoke without supervision.  Keep all follow-up visits as told by your health care provider. This is important. Contact a health care provider if:  You keep feeling nauseous or you keep vomiting.  You feel light-headed.  You develop a rash.  You have a fever. Get help right away if:  You have trouble breathing. This information is not intended to replace advice given to you by your health care provider. Make sure you discuss any questions you have with your health care provider. Document Released: 06/03/2015 Document Revised: 10/03/2015 Document Reviewed: 06/03/2015 Elsevier Interactive Patient Education  [employer].

## 2017-11-26 ENCOUNTER — Encounter (HOSPITAL_COMMUNITY): Payer: Self-pay | Admitting: Internal Medicine

## 2017-12-09 ENCOUNTER — Other Ambulatory Visit: Payer: Self-pay | Admitting: Cardiology

## 2017-12-10 ENCOUNTER — Other Ambulatory Visit: Payer: Self-pay | Admitting: Cardiovascular Disease

## 2017-12-17 ENCOUNTER — Encounter (HOSPITAL_COMMUNITY): Payer: Self-pay | Admitting: Emergency Medicine

## 2017-12-17 ENCOUNTER — Other Ambulatory Visit: Payer: Self-pay

## 2017-12-17 ENCOUNTER — Emergency Department (HOSPITAL_COMMUNITY): Payer: BLUE CROSS/BLUE SHIELD

## 2017-12-17 ENCOUNTER — Observation Stay (HOSPITAL_COMMUNITY)
Admission: EM | Admit: 2017-12-17 | Discharge: 2017-12-18 | Disposition: A | Discharge status: Home / Self Care | Payer: BLUE CROSS/BLUE SHIELD | Attending: Cardiology | Admitting: Cardiology

## 2017-12-17 DIAGNOSIS — Z955 Presence of coronary angioplasty implant and graft: Secondary | ICD-10-CM | POA: Diagnosis not present

## 2017-12-17 DIAGNOSIS — R7303 Prediabetes: Secondary | ICD-10-CM | POA: Diagnosis not present

## 2017-12-17 DIAGNOSIS — N183 Chronic kidney disease, stage 3 (moderate): Secondary | ICD-10-CM | POA: Diagnosis not present

## 2017-12-17 DIAGNOSIS — R531 Weakness: Secondary | ICD-10-CM

## 2017-12-17 DIAGNOSIS — I5022 Chronic systolic (congestive) heart failure: Secondary | ICD-10-CM | POA: Insufficient documentation

## 2017-12-17 DIAGNOSIS — Z8249 Family history of ischemic heart disease and other diseases of the circulatory system: Secondary | ICD-10-CM | POA: Insufficient documentation

## 2017-12-17 DIAGNOSIS — I200 Unstable angina: Secondary | ICD-10-CM | POA: Diagnosis not present

## 2017-12-17 DIAGNOSIS — I480 Paroxysmal atrial fibrillation: Secondary | ICD-10-CM | POA: Insufficient documentation

## 2017-12-17 DIAGNOSIS — Z885 Allergy status to narcotic agent status: Secondary | ICD-10-CM | POA: Insufficient documentation

## 2017-12-17 DIAGNOSIS — E039 Hypothyroidism, unspecified: Secondary | ICD-10-CM | POA: Diagnosis not present

## 2017-12-17 DIAGNOSIS — I25110 Atherosclerotic heart disease of native coronary artery with unstable angina pectoris: Secondary | ICD-10-CM | POA: Diagnosis not present

## 2017-12-17 DIAGNOSIS — I959 Hypotension, unspecified: Secondary | ICD-10-CM | POA: Diagnosis present

## 2017-12-17 DIAGNOSIS — I2510 Atherosclerotic heart disease of native coronary artery without angina pectoris: Secondary | ICD-10-CM | POA: Diagnosis present

## 2017-12-17 DIAGNOSIS — I255 Ischemic cardiomyopathy: Secondary | ICD-10-CM | POA: Insufficient documentation

## 2017-12-17 DIAGNOSIS — Z7901 Long term (current) use of anticoagulants: Secondary | ICD-10-CM | POA: Insufficient documentation

## 2017-12-17 DIAGNOSIS — I340 Nonrheumatic mitral (valve) insufficiency: Secondary | ICD-10-CM | POA: Insufficient documentation

## 2017-12-17 DIAGNOSIS — R0789 Other chest pain: Secondary | ICD-10-CM | POA: Diagnosis present

## 2017-12-17 DIAGNOSIS — Z7902 Long term (current) use of antithrombotics/antiplatelets: Secondary | ICD-10-CM | POA: Diagnosis not present

## 2017-12-17 DIAGNOSIS — I252 Old myocardial infarction: Secondary | ICD-10-CM | POA: Insufficient documentation

## 2017-12-17 HISTORY — DX: Chronic kidney disease, stage 3 unspecified: N18.30

## 2017-12-17 HISTORY — DX: Acute myocardial infarction, unspecified: I21.9

## 2017-12-17 HISTORY — DX: Chronic kidney disease, stage 3 (moderate): N18.3

## 2017-12-17 LAB — CBC
HCT: 41.3 % (ref 36.0–46.0)
Hemoglobin: 13.2 g/dL (ref 12.0–15.0)
MCH: 29.8 pg (ref 26.0–34.0)
MCHC: 32 g/dL (ref 30.0–36.0)
MCV: 93.2 fL (ref 80.0–100.0)
Platelets: 183 10*3/uL (ref 150–400)
RBC: 4.43 MIL/uL (ref 3.87–5.11)
RDW: 12.4 % (ref 11.5–15.5)
WBC: 7.9 10*3/uL (ref 4.0–10.5)
nRBC: 0 % (ref 0.0–0.2)

## 2017-12-17 LAB — BASIC METABOLIC PANEL
Anion gap: 7 (ref 5–15)
BUN: 21 mg/dL — ABNORMAL HIGH (ref 6–20)
CO2: 29 mmol/L (ref 22–32)
Calcium: 9.9 mg/dL (ref 8.9–10.3)
Chloride: 101 mmol/L (ref 98–111)
Creatinine, Ser: 1.24 mg/dL — ABNORMAL HIGH (ref 0.44–1.00)
GFR calc Af Amer: 57 mL/min — ABNORMAL LOW (ref >60)
GFR calc non Af Amer: 49 mL/min — ABNORMAL LOW (ref >60)
Glucose, Bld: 107 mg/dL — ABNORMAL HIGH (ref 70–99)
Potassium: 4.2 mmol/L (ref 3.5–5.1)
Sodium: 137 mmol/L (ref 135–145)

## 2017-12-17 LAB — I-STAT TROPONIN, ED: Troponin i, poc: 0 ng/mL (ref 0.00–0.08)

## 2017-12-17 LAB — HEPATIC FUNCTION PANEL
ALT: 15 U/L (ref 0–44)
AST: 23 U/L (ref 15–41)
Albumin: 4.2 g/dL (ref 3.5–5.0)
Alkaline Phosphatase: 64 U/L (ref 38–126)
Bilirubin, Direct: 0.2 mg/dL (ref 0.0–0.2)
Indirect Bilirubin: 1 mg/dL — ABNORMAL HIGH (ref 0.3–0.9)
Total Bilirubin: 1.2 mg/dL (ref 0.3–1.2)
Total Protein: 7 g/dL (ref 6.5–8.1)

## 2017-12-17 LAB — TSH: TSH: 0.231 u[IU]/mL — ABNORMAL LOW (ref 0.350–4.500)

## 2017-12-17 LAB — TROPONIN I
Troponin I: <0.03 ng/mL (ref <0.03)
Troponin I: <0.03 ng/mL (ref <0.03)

## 2017-12-17 LAB — I-STAT BETA HCG BLOOD, ED (MC, WL, AP ONLY): I-stat hCG, quantitative: <5 m[IU]/mL (ref <5)

## 2017-12-17 MED ORDER — TRAZODONE HCL 50 MG PO TABS
25.0000 mg | ORAL_TABLET | Freq: Every evening | ORAL | Status: DC | PRN
  Administered 2017-12-17: 25 mg via ORAL
  Filled 2017-12-17: qty 1

## 2017-12-17 MED ORDER — SODIUM CHLORIDE 0.9% FLUSH: 3.0000 mL | INTRAVENOUS | Status: DC | PRN

## 2017-12-17 MED ORDER — SODIUM CHLORIDE 0.9 % IV SOLN
INTRAVENOUS | Status: DC
  Administered 2017-12-18: 06:00:00 via INTRAVENOUS

## 2017-12-17 MED ORDER — ASPIRIN 81 MG PO CHEW
81.0000 mg | CHEWABLE_TABLET | ORAL | Status: AC
  Administered 2017-12-18: 81 mg via ORAL
  Filled 2017-12-17: qty 1

## 2017-12-17 MED ORDER — PANTOPRAZOLE SODIUM 40 MG PO TBEC
40.0000 mg | DELAYED_RELEASE_TABLET | Freq: Every day | ORAL | Status: DC
  Administered 2017-12-18: 40 mg via ORAL
  Filled 2017-12-17: qty 1

## 2017-12-17 MED ORDER — SODIUM CHLORIDE 0.9 % IV SOLN: 250.0000 mL | INTRAVENOUS | Status: DC | PRN

## 2017-12-17 MED ORDER — ASPIRIN EC 81 MG PO TBEC: 81.0000 mg | DELAYED_RELEASE_TABLET | Freq: Every day | ORAL | Status: DC

## 2017-12-17 MED ORDER — SODIUM CHLORIDE 0.9% FLUSH
3.0000 mL | Freq: Two times a day (BID) | INTRAVENOUS | Status: DC
  Administered 2017-12-17: 3 mL via INTRAVENOUS

## 2017-12-17 MED ORDER — FUROSEMIDE 20 MG PO TABS: 20.0000 mg | ORAL_TABLET | Freq: Every day | ORAL | Status: DC

## 2017-12-17 MED ORDER — ROSUVASTATIN CALCIUM 10 MG PO TABS
10.0000 mg | ORAL_TABLET | Freq: Every day | ORAL | Status: DC
  Administered 2017-12-18: 10 mg via ORAL
  Filled 2017-12-17: qty 1

## 2017-12-17 MED ORDER — LEVOTHYROXINE SODIUM 125 MCG PO TABS
125.0000 ug | ORAL_TABLET | Freq: Every morning | ORAL | Status: DC
  Administered 2017-12-18: 125 ug via ORAL
  Filled 2017-12-17: qty 1

## 2017-12-17 MED ORDER — TRIAMCINOLONE ACETONIDE 55 MCG/ACT NA AERO
2.0000 | INHALATION_SPRAY | Freq: Every day | NASAL | Status: DC
  Filled 2017-12-17: qty 21.6

## 2017-12-17 MED ORDER — POTASSIUM CHLORIDE CRYS ER 20 MEQ PO TBCR
40.0000 meq | EXTENDED_RELEASE_TABLET | Freq: Every day | ORAL | Status: DC
  Filled 2017-12-17: qty 2

## 2017-12-17 MED ORDER — ONDANSETRON HCL 4 MG/2ML IJ SOLN: 4.0000 mg | Freq: Four times a day (QID) | INTRAMUSCULAR | Status: DC | PRN

## 2017-12-17 MED ORDER — SPIRONOLACTONE 25 MG PO TABS
25.0000 mg | ORAL_TABLET | Freq: Every day | ORAL | Status: DC
  Administered 2017-12-17: 25 mg via ORAL
  Filled 2017-12-17: qty 1

## 2017-12-17 MED ORDER — LOSARTAN POTASSIUM 25 MG PO TABS
25.0000 mg | ORAL_TABLET | Freq: Every day | ORAL | Status: DC
  Administered 2017-12-17: 25 mg via ORAL
  Filled 2017-12-17: qty 1

## 2017-12-17 MED ORDER — LORATADINE 10 MG PO TABS: 10.0000 mg | ORAL_TABLET | Freq: Every day | ORAL | Status: DC

## 2017-12-17 MED ORDER — SOTALOL HCL 80 MG PO TABS
160.0000 mg | ORAL_TABLET | Freq: Two times a day (BID) | ORAL | Status: DC
  Administered 2017-12-17 – 2017-12-18 (×2): 160 mg via ORAL
  Filled 2017-12-17 (×4): qty 2

## 2017-12-17 MED ORDER — ACETAMINOPHEN 325 MG PO TABS: 650.0000 mg | ORAL_TABLET | ORAL | Status: DC | PRN

## 2017-12-17 MED ORDER — NITROGLYCERIN 0.4 MG SL SUBL: 0.4000 mg | SUBLINGUAL_TABLET | SUBLINGUAL | Status: DC | PRN

## 2017-12-17 MED ORDER — ASPIRIN 81 MG PO CHEW
324.0000 mg | CHEWABLE_TABLET | Freq: Once | ORAL | Status: AC
  Administered 2017-12-17: 324 mg via ORAL
  Filled 2017-12-17: qty 4

## 2017-12-17 NOTE — ED Provider Notes (Signed)
[facility] EMERGENCY DEPARTMENT Provider Note   CSN: [id number] Arrival date & time: 12/17/17  1043     History   Chief Complaint Chief Complaint  Patient presents with  . Weakness  . Chest Pain    HPI Catherine Mays is a 52 y.o. female.  The history is provided by the patient and medical records. No language interpreter was used.     Catherine Mays is a 52 y.o. female  with a PMH of prior MI with stent placement, afib, CHF, AICD in place who presents to the Emergency Department complaining of central chest pressure that began this morning.  Associated with left jaw pain and generalized weakness.  She felt as if she had "Jell-O legs".  She also reports feeling hot and clammy with her symptoms.  She reports this feels different than her prior MI, as that was more of a chest pain, where as this is pressure.  Walking around this morning did exacerbate her pain.  When in the waiting room, she got up to go to the vending machine, and felt her discomfort returned.  Sitting in the hospital bed currently, she is chest pain-free.  She has taken no medications other than her usual home medications this morning.  This does not include an aspirin.   Past Medical History:  Diagnosis Date  . AICD (automatic cardioverter/defibrillator) present    [employer]  . CAD in native artery    a. late recognition of presentation of STEMI 08/2015 s/p DES to LAD, mild residual mRCA.  Marland Kitchen Chronic systolic CHF (congestive heart failure) (HCC)   . CKD (chronic kidney disease), stage III (HCC)   . Hypotension    a. preventing med titration for HF.  Marland Kitchen Hypothyroidism 09/24/2015  . Ischemic cardiomyopathy   . PAF (paroxysmal atrial fibrillation) (HCC) 09/24/2015   a. dx at time of STEMI 08/2015.  . Pre-diabetes   . Presence of permanent cardiac pacemaker   . PVC's (premature ventricular contractions)     Patient Active Problem List   Diagnosis Date Noted  . Unstable angina (HCC) 12/17/2017    . Encounter for screening colonoscopy 10/19/2017  . PVC (premature ventricular contraction) 04/02/2017  . Mitral regurgitation 04/17/2016  . CHF (congestive heart failure) (HCC) 01/23/2016  . PVC's (premature ventricular contractions)   . Pre-diabetes   . Chronic systolic CHF (congestive heart failure) (HCC)   . Ischemic cardiomyopathy   . Hypotension   . CAD in native artery   . Hypothyroidism 09/24/2015  . PAF (paroxysmal atrial fibrillation) (HCC) 09/24/2015    Past Surgical History:  Procedure Laterality Date  . CARDIAC CATHETERIZATION N/A 09/20/2015   Procedure: Left Heart Cath and Coronary Angiography;  Surgeon: Kathleene Hazel, MD;  Location: [facility] INVASIVE CV LAB;  Service: Cardiovascular;  Laterality: N/A;  . CARDIAC CATHETERIZATION N/A 09/20/2015   Procedure: Coronary Stent Intervention;  Surgeon: Kathleene Hazel, MD;  Location: [facility] INVASIVE CV LAB;  Service: Cardiovascular;  Laterality: N/A;  . CARDIAC CATHETERIZATION N/A 09/21/2015   Procedure: Left Heart Cath and Coronary Angiography;  Surgeon: Tonny Bollman, MD;  Location: [facility] INVASIVE CV LAB;  Service: Cardiovascular;  Laterality: N/A;  . CARDIAC CATHETERIZATION N/A 11/26/2015   Procedure: Right Heart Cath;  Surgeon: [name], MD;  Location: [facility] INVASIVE CV LAB;  Service: Cardiovascular;  Laterality: N/A;  . COLONOSCOPY WITH PROPOFOL N/A 11/23/2017   Procedure: COLONOSCOPY WITH PROPOFOL;  Surgeon: Corbin Ade, MD;  Location: [facility] ENDO SUITE;  Service: Endoscopy;  Laterality: N/A;  8:45am  . CORONARY STENT PLACEMENT  09/20/2015  . EP IMPLANTABLE DEVICE N/A 01/23/2016   Procedure: ICD Implant;  Surgeon: Will Jorja Loa, MD;  Location: [facility] INVASIVE CV LAB;  Service: Cardiovascular;  Laterality: N/A;  . PVC ABLATION N/A 04/02/2017   Procedure: PVC ABLATION;  Surgeon: Regan Lemming, MD;  Location: [facility] INVASIVE CV LAB;  Service: Cardiovascular;  Laterality: N/A;  . TEE WITHOUT CARDIOVERSION N/A 10/08/2016    Procedure: TRANSESOPHAGEAL ECHOCARDIOGRAM (TEE);  Surgeon: [name], MD;  Location: [facility] ENDOSCOPY;  Service: Cardiovascular;  Laterality: N/A;  . THYROIDECTOMY       OB History   None      Home Medications    Prior to Admission medications   Medication Sig Start Date End Date Taking? Authorizing Provider  Calcium Carb-Cholecalciferol (CALCIUM + D3 PO) Take 1 tablet by mouth every morning.   Yes [provider]  Coenzyme Q10 (COQ10) 200 MG CAPS Take 200 mg by mouth daily.   Yes [provider]  fexofenadine (ALLEGRA) 180 MG tablet Take 180 mg by mouth daily.   Yes [provider]  furosemide (LASIX) 20 MG tablet Take 20 mg by mouth daily.   Yes [provider]  losartan (COZAAR) 25 MG tablet Take 1 tablet (25 mg total) by mouth at bedtime. 10/05/17  Yes [name], MD  nitroGLYCERIN (NITROSTAT) 0.4 MG SL tablet Place 1 tablet (0.4 mg total) under the tongue every 5 (five) minutes as needed for chest pain. 08/14/17  Yes Camnitz, Will Daphine Deutscher, MD  pantoprazole (PROTONIX) 40 MG tablet TAKE 1 TABLET BY MOUTH EVERY DAY 12/10/17  Yes Kathleene Hazel, MD  potassium chloride SA (K-DUR,KLOR-CON) 20 MEQ tablet Take 2 tablets (40 mEq total) by mouth daily. 10/27/17  Yes [name], MD  rosuvastatin (CRESTOR) 10 MG tablet Take 1 tablet (10 mg total) by mouth daily. 09/11/17  Yes [name], MD  sotalol (BETAPACE) 160 MG tablet TAKE 1 TABLET BY MOUTH TWICE DAILY Patient taking differently: Take 160 mg by mouth 2 (two) times daily.  09/01/17  Yes Camnitz, Andree Coss, MD  spironolactone (ALDACTONE) 25 MG tablet Take 1 tablet (25 mg total) by mouth daily. 10/13/17 01/11/18 Yes [name], MD  SYNTHROID 125 MCG tablet Take 125 mcg by mouth every morning.  08/25/15  Yes [provider]  traZODone (DESYREL) 50 MG tablet Take 0.5 tablets (25 mg total) by mouth at bedtime as needed for sleep. 11/26/15  Yes Clegg, Amy D, NP   triamcinolone (NASACORT ALLERGY 24HR) 55 MCG/ACT AERO nasal inhaler Place 2 sprays into the nose daily.    Yes [provider]  XARELTO 20 MG TABS tablet TAKE 1 TABLET EVERY DAY WITH SUPPER Patient taking differently: Take 20 mg by mouth daily with supper.  09/21/17  Yes Kathleene Hazel, MD    Family History Family History  Problem Relation Age of Onset  . Heart attack Father   . Arrhythmia Mother   . Colon cancer Neg Hx   . Colon polyps Neg Hx     Social History Social History   Tobacco Use  . Smoking status: Never Smoker  . Smokeless tobacco: Never Used  Substance Use Topics  . Alcohol use: No  . Drug use: No     Allergies   Paxil [paroxetine]; Morphine and related; Percocet [oxycodone-acetaminophen]; Cefdinir; and Zolpidem   Review of Systems Review of Systems  Constitutional: Positive for diaphoresis.  Cardiovascular:  Positive for chest pain. Negative for palpitations and leg swelling.  Neurological: Positive for weakness. Negative for dizziness, syncope, numbness and headaches.  All other systems reviewed and are negative.    Physical Exam Updated Vital Signs BP 102/80   Pulse (!) 49   Resp 15   Ht 5\' 9"  (1.753 m)   Wt 66.7 kg   SpO2 98%   BMI 21.71 kg/m   Physical Exam  Constitutional: She is oriented to person, place, and time. She appears well-developed and well-nourished. No distress.  HENT:  Head: Normocephalic and atraumatic.  Neck: No JVD present.  Cardiovascular: Regular rhythm and normal heart sounds.  No murmur heard. Bradycardic, but regular.  Pulmonary/Chest: Effort normal and breath sounds normal. No respiratory distress.  Abdominal: Soft. She exhibits no distension. There is no tenderness.  Musculoskeletal: She exhibits no edema.  Neurological: She is alert and oriented to person, place, and time.  Skin: Skin is warm and dry.  Nursing note and vitals reviewed.    ED Treatments / Results  Labs (all labs ordered  are listed, but only abnormal results are displayed) Labs Reviewed  BASIC METABOLIC PANEL - Abnormal; Notable for the following components:      Result Value   Glucose, Bld 107 (*)    BUN 21 (*)    Creatinine, Ser 1.24 (*)    GFR calc non Af Amer 49 (*)    GFR calc Af Amer 57 (*)    All other components within normal limits  CBC  HIV ANTIBODY (ROUTINE TESTING W REFLEX)  TROPONIN I  TROPONIN I  TROPONIN I  TSH  HEPATIC FUNCTION PANEL  I-STAT TROPONIN, ED  I-STAT BETA HCG BLOOD, ED ([facility], [facility], [facility] ONLY)    EKG EKG Interpretation  Date/Time:  Thursday December 17 2017 10:49:00 EDT Ventricular Rate:  55 PR Interval:  172 QRS Duration: 120 QT Interval:  438 QTC Calculation: 419 R Axis:   -90 Text Interpretation:  Sinus bradycardia with occasional Premature ventricular complexes Possible Left atrial enlargement Left axis deviation Right bundle branch block Anterior infarct , age undetermined Abnormal ECG When comapred to prior, no signifiacnt cahnges seen.  no STEMI Confirmed by Theda Belfast ([id number]) on 12/17/2017 12:52:52 PM   Radiology Dg Chest 2 View  Result Date: 12/17/2017 CLINICAL DATA:  Chest pain and pressure, some dizziness EXAM: CHEST - 2 VIEW COMPARISON:  Chest x-ray of 01/24/2016 FINDINGS: No active infiltrate or effusion is seen. Mediastinal and hilar contours are unremarkable and mild cardiomegaly is stable. A single AICD lead is present. No bony abnormality is evident. IMPRESSION: 1. No active lung disease. 2. Stable mild cardiomegaly with AICD lead present. Electronically Signed   By: Dwyane Dee M.D.   On: 12/17/2017 11:44    Procedures Procedures (including critical care time)  Medications Ordered in ED Medications  furosemide (LASIX) tablet 20 mg (has no administration in time range)  losartan (COZAAR) tablet 25 mg (has no administration in time range)  rosuvastatin (CRESTOR) tablet 10 mg (has no administration in time range)  sotalol (BETAPACE) tablet 160 mg  (has no administration in time range)  traZODone (DESYREL) tablet 25 mg (has no administration in time range)  levothyroxine (SYNTHROID, LEVOTHROID) tablet 125 mcg (has no administration in time range)  pantoprazole (PROTONIX) EC tablet 40 mg (has no administration in time range)  loratadine (CLARITIN) tablet 10 mg (has no administration in time range)  triamcinolone (NASACORT) nasal inhaler 2 spray (has no administration in time range)  spironolactone (ALDACTONE) tablet 25 mg (has no administration in time range)  potassium chloride SA (K-DUR,KLOR-CON) CR tablet 40 mEq (has no administration in time range)  aspirin EC tablet 81 mg (has no administration in time range)  nitroGLYCERIN (NITROSTAT) SL tablet 0.4 mg (has no administration in time range)  acetaminophen (TYLENOL) tablet 650 mg (has no administration in time range)  ondansetron (ZOFRAN) injection 4 mg (has no administration in time range)  sodium chloride flush (NS) 0.9 % injection 3 mL (has no administration in time range)  sodium chloride flush (NS) 0.9 % injection 3 mL (has no administration in time range)  0.9 %  sodium chloride infusion (has no administration in time range)  aspirin chewable tablet 81 mg (has no administration in time range)  sodium chloride flush (NS) 0.9 % injection 3 mL (has no administration in time range)  sodium chloride flush (NS) 0.9 % injection 3 mL (has no administration in time range)  0.9 %  sodium chloride infusion (has no administration in time range)  0.9 %  sodium chloride infusion (has no administration in time range)  aspirin chewable tablet 324 mg (324 mg Oral Given 12/17/17 1535)     Initial Impression / Assessment and Plan / ED Course  I have reviewed the triage vital signs and the nursing notes.  Pertinent labs & imaging results that were available during my care of the patient were reviewed by me and considered in my medical decision making (see chart for details).    Ellena Suther  is a 52 y.o. female who presents to ED for central chest pressure associated with left pain and weakness.  Appears her symptoms are exertional.  On exam, she is hypotensive, but it does appear this is near her baseline.  She has history of prior MI with stent placement.  History is concerning for cardiac etiology.  Initial troponin negative.  EKG with no significant changes from previous.  Cardiology was consulted who will admit.    Final Clinical Impressions(s) / ED Diagnoses   Final diagnoses:  Unstable angina [facility])    ED Discharge Orders    None       Ward, Chase Picket, PA-C 12/17/17 1548    Tegeler, Canary Brim, MD 12/18/17 [phone]

## 2017-12-17 NOTE — ED Triage Notes (Signed)
Pt presents with c/o weakness in her legs this morning with pressure in th middle of her chest with left side jaw pain. She went to work but when she ambulates symptoms return.  Hx of MI with stents placed and defib.  The patient is alert and oriented x4 with no acute distress at triage.

## 2017-12-17 NOTE — H&P (Addendum)
Cardiology Consultation:   Patient ID: Catherine Mays; [id number]; 1965-02-28   Admit date: 12/17/2017 Date of Consult: 12/17/2017  Primary Care Provider: Benita Stabile, MD Primary Cardiologist: Verne Carrow, MD Primary Electrophysiologist:  Will Jorja Loa, MD  Chief Complaint: weakness, chest pressure, jaw pain  Patient Profile:   Catherine Mays is a 52 y.o. female with a hx of CAD (STEMI 08/2015 s/p PTCA/DES to LAD, mild residual mRCA), ICM/chronic systolic CHF EF 30-35% at time of STEMI, St. Jude ICD, paroxysmal atrial fib (dx at time of STEMI), PVCs (s/p ablation with recurrence),, pre-diabetes, hypotension, probable CKD stage III who is being seen today for the evaluation of chest pain at the request of Dr. Rush Landmark.  History of Present Illness:   Catherine Mays has a history of late presenting STEMI in 08/2015. She had initially presented to [facility] with upper chest pain/arm pain, nausea and vomiting. Troponin was negative and she was discharged with presumed GI pain. She returned back to the ER where she was found to have anterior STEMI. Symptoms had begun 8 hours earlier. She was transferred emergently to [facility] and underwent PTCA/DES to LAD. She had a late presentation to the cath lab. She underwent relook cath 09/20/17 for contiued pain which demonstrating patency of the stent. Echo had shown EF 30-35%. She had peri-MI atrial fibrillation and was started on Xarelto and amiodarone. She was discharged with LifeVest and ASA/Brilinta/Xarelto. ASA was discontinued after 1 month and Brilinta was changed to Plavix.  After discharge, she continued to feel poorly with nausea, anorexia, and significant fatigue. She was admitted for evaluation in 10/17 given concern for low output heart failure. However, stopping amiodarone essentially resolved her symptoms. She had RHC showing preserved cardiac output but the PCWP was high due to prominent v-waves, presumably from significant mitral  regurgitation. Of note, she was in atrial fibrillation for a time during this admission. 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 She had an ICD placed in 12/2015. She has been followed by Dr. Elberta Fortis and noted to have very frequent PVCs on device interrogation. Holter in 12/18 showed 41% PVCs. She was started on sotalol 80 mg bid and titrated up to 120 mg bid. Coreg was stopped with the addition of sotalol and losartan was decreased to 12.5 mg daily due to low BP  She had PVC ablation in 2/19 but PVCs recurred. Sotalol was increased to 160 mg BID. Last echocardiogram 10/05/17 showed EF 30% with akinesis of the inferoseptum and anteroseptum, apical inferior and apical anterior akinesis, akinesis of apex, grade 2 DD, moderate mitral regurgitation. 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  She states after sotalol was increased this year she felt the best she has since the original MI. In August 2019 her BP, albeit chronically low, was felt to tolerate increase in losartan. She has noticed the last week she was unusually fatigued. Over the weekend she [place] but felt wiped out the following day all day. Today while getting ready for work she felt she had "jello legs" which she had not felt for a long time. She also began to feel mild chest pressure and nausea. At one point she felt as though she would have to go to the bathroom for diarrhea but did not have to. She sat down and symptoms improved. However, each successive time  she would get up and walk around, the "jello legs" and chest pressure would return, as well as left sided jaw pain. She went onto work in hopes of feeling better but this again returned with exertion. She tried to check BP during symptoms and got an error each time. She called her husband who drove her to the ER. In the waiting room symptoms recurred  while walking to the vending machine but did not occur while walking to her ER room. She currently feels fine at rest. Labs show normal CBC, negative troponin x 1, Cr 1.24 (with prior baseline 1.1-1.2). CXR NAD, stable mild cardiomegaly. EKG nonacute, occasional wide complex beats either representing PVC or junctional beat. Upon arrival, BP 92/59 with lowest reading 85/55, but patient states this is not unusual for her. Her symptoms are out of the norm, however. Appetite hasn't been the greatest this week.   Past Medical History:  Diagnosis Date  . AICD (automatic cardioverter/defibrillator) present    St. Jude  . CAD in native artery    a. late recognition of presentation of STEMI 08/2015 s/p DES to LAD, mild residual mRCA.  Marland Kitchen Chronic systolic CHF (congestive heart failure) (HCC)   . CKD (chronic kidney disease), stage III (HCC)   . Hypotension    a. preventing med titration for HF.  Marland Kitchen Hypothyroidism 09/24/2015  . Ischemic cardiomyopathy   . PAF (paroxysmal atrial fibrillation) (HCC) 09/24/2015   a. dx at time of STEMI 08/2015.  . Pre-diabetes   . Presence of permanent cardiac pacemaker   . PVC's (premature ventricular contractions)     Past Surgical History:  Procedure Laterality Date  . CARDIAC CATHETERIZATION N/A 09/20/2015   Procedure: Left Heart Cath and Coronary Angiography;  Surgeon: Kathleene Hazel, MD;  Location: [facility] INVASIVE CV LAB;  Service: Cardiovascular;  Laterality: N/A;  . CARDIAC CATHETERIZATION N/A 09/20/2015   Procedure: Coronary Stent Intervention;  Surgeon: Kathleene Hazel, MD;  Location: [facility] INVASIVE CV LAB;  Service: Cardiovascular;  Laterality: N/A;  . CARDIAC CATHETERIZATION N/A 09/21/2015   Procedure: Left Heart Cath and Coronary Angiography;  Surgeon: Tonny Bollman, MD;  Location: [facility] INVASIVE CV LAB;  Service: Cardiovascular;  Laterality: N/A;  . CARDIAC CATHETERIZATION N/A 11/26/2015   Procedure: Right Heart Cath;  Surgeon: [name], MD;   Location: [facility] INVASIVE CV LAB;  Service: Cardiovascular;  Laterality: N/A;  . COLONOSCOPY WITH PROPOFOL N/A 11/23/2017   Procedure: COLONOSCOPY WITH PROPOFOL;  Surgeon: Corbin Ade, MD;  Location: [facility] ENDO SUITE;  Service: Endoscopy;  Laterality: N/A;  8:45am  . CORONARY STENT PLACEMENT  09/20/2015  . EP IMPLANTABLE DEVICE N/A 01/23/2016   Procedure: ICD Implant;  Surgeon: Will Jorja Loa, MD;  Location: [facility] INVASIVE CV LAB;  Service: Cardiovascular;  Laterality: N/A;  . PVC ABLATION N/A 04/02/2017   Procedure: PVC ABLATION;  Surgeon: Regan Lemming, MD;  Location: [facility] INVASIVE CV LAB;  Service: Cardiovascular;  Laterality: N/A;  . TEE WITHOUT CARDIOVERSION N/A 10/08/2016   Procedure: TRANSESOPHAGEAL ECHOCARDIOGRAM (TEE);  Surgeon: [name], MD;  Location: [facility] ENDOSCOPY;  Service: Cardiovascular;  Laterality: N/A;  . THYROIDECTOMY       Inpatient Medications: Scheduled Meds: . aspirin  324 mg Oral Once   Continuous Infusions:  PRN Meds:   Home Meds: Prior to Admission medications   Medication Sig Start Date End Date Taking? Authorizing Provider  Calcium Carb-Cholecalciferol (CALCIUM + D3 PO) Take 1 tablet by mouth every morning.    [provider]  Coenzyme Q10 (COQ10) 200 MG CAPS Take 200 mg by mouth daily.    [provider]  fexofenadine (ALLEGRA) 180 MG tablet Take 180 mg by mouth daily.    [provider]  furosemide (LASIX) 20 MG tablet Take 20 mg by mouth daily.    [provider]  losartan (COZAAR) 25 MG tablet Take 1 tablet (25 mg total) by mouth at bedtime. 10/05/17   [name], MD  nitroGLYCERIN (NITROSTAT) 0.4 MG SL tablet Place 1 tablet (0.4 mg total) under the tongue every 5 (five) minutes as needed for chest pain. 08/14/17   Camnitz, Will Daphine Deutscher, MD  pantoprazole (PROTONIX) 40 MG tablet TAKE 1 TABLET BY MOUTH EVERY DAY 12/10/17   Kathleene Hazel, MD  potassium chloride SA (K-DUR,KLOR-CON) 20 MEQ tablet Take 2  tablets (40 mEq total) by mouth daily. 10/27/17   [name], MD  rosuvastatin (CRESTOR) 10 MG tablet Take 1 tablet (10 mg total) by mouth daily. 09/11/17   [name], MD  sotalol (BETAPACE) 160 MG tablet TAKE 1 TABLET BY MOUTH TWICE DAILY Patient taking differently: Take 160 mg by mouth 2 (two) times daily.  09/01/17   Camnitz, Andree Coss, MD  spironolactone (ALDACTONE) 25 MG tablet Take 1 tablet (25 mg total) by mouth daily. 10/13/17 01/11/18  [name], MD  SYNTHROID 125 MCG tablet Take 125 mcg by mouth every morning.  08/25/15   [provider]  traZODone (DESYREL) 50 MG tablet Take 0.5 tablets (25 mg total) by mouth at bedtime as needed for sleep. 11/26/15   Clegg, Amy D, NP  triamcinolone (NASACORT ALLERGY 24HR) 55 MCG/ACT AERO nasal inhaler Place 2 sprays into the nose daily.     [provider]  XARELTO 20 MG TABS tablet TAKE 1 TABLET EVERY DAY WITH SUPPER Patient taking differently: Take 20 mg by mouth daily with supper.  09/21/17   Kathleene Hazel, MD    Allergies:    Allergies  Allergen Reactions  . Paxil [Paroxetine] Palpitations  . Morphine And Related Nausea And Vomiting  . Percocet [Oxycodone-Acetaminophen] Nausea And Vomiting  . Cefdinir Nausea Only  . Zolpidem Other (See Comments)    Doesn't work for patient at all    Social History:   Social History   Socioeconomic History  . Marital status: Married    Spouse name: Not on file  . Number of children: Not on file  . Years of education: Not on file  . Highest education level: Not on file  Occupational History  . Not on file  Social Needs  . Financial resource strain: Not on file  . Food insecurity:    Worry: Not on file    Inability: Not on file  . Transportation needs:    Medical: Not on file    Non-medical: Not on file  Tobacco Use  . Smoking status: Never Smoker  . Smokeless tobacco: Never Used  Substance and Sexual Activity  . Alcohol use: No  . Drug use: No  .  Sexual activity: Not on file  Lifestyle  . Physical activity:    Days per week: Not on file    Minutes per session: Not on file  . Stress: Not on file  Relationships  . Social connections:    Talks on phone: Not on file    Gets together: Not on file    Attends religious service: Not on file    Active member of club or organization:  Not on file    Attends meetings of clubs or organizations: Not on file    Relationship status: Not on file  . Intimate partner violence:    Fear of current or ex partner: Not on file    Emotionally abused: Not on file    Physically abused: Not on file    Forced sexual activity: Not on file  Other Topics Concern  . Not on file  Social History Narrative  . Not on file    Family History:   The patient's family history includes Arrhythmia in her mother; Heart attack in her father. There is no history of Colon cancer or Colon polyps.  ROS:  Please see the history of present illness.  All other ROS reviewed and negative.     Physical Exam/Data:   Vitals:   12/17/17 1230 12/17/17 1245 12/17/17 1300 12/17/17 1315  BP: 104/74 92/69 (!) 85/55 (!) 87/66  Pulse: (!) 57 (!) 46 (!) 51 (!) 50  Resp: 15 19 17 20   SpO2: 100% 100% 100% 100%  Weight:      Height:       No intake or output data in the 24 hours ending 12/17/17 1345 Filed Weights   12/17/17 1106  Weight: 66.7 kg   Body mass index is 21.71 kg/m.  General: Well developed, well nourished, in no acute distress. Head: Normocephalic, atraumatic, sclera non-icteric, no xanthomas, nares are without discharge.  Neck: Negative for carotid bruits. JVD not elevated. Lungs: Clear bilaterally to auscultation without wheezes, rales, or rhonchi. Breathing is unlabored. Heart: RRR with S1 S2. No murmurs, rubs, or gallops appreciated. Abdomen: Soft, non-tender, non-distended with normoactive bowel sounds. No hepatomegaly. No rebound/guarding. No obvious abdominal masses. Msk:  Strength and tone appear  normal for age. Extremities: No clubbing or cyanosis. No edema.  Distal pedal pulses are 2+ and equal bilaterally. Neuro: Alert and oriented X 3. No facial asymmetry. No focal deficit. Moves all extremities spontaneously. Psych:  Responds to questions appropriately with a normal affect.  EKG:  The EKG was personally reviewed and demonstrates SB 55bpm with occ PVCs, prior anterior infarction with poor R wave progression, TWI avL   Relevant CV Studies: As above.   Laboratory Data:  Chemistry Recent Labs  Lab 12/17/17 1100  NA 137  K 4.2  CL 101  CO2 29  GLUCOSE 107*  BUN 21*  CREATININE 1.24*  CALCIUM 9.9  GFRNONAA 49*  GFRAA 57*  ANIONGAP 7    No results for input(s): PROT, ALBUMIN, AST, ALT, ALKPHOS, BILITOT in the last 168 hours. Hematology Recent Labs  Lab 12/17/17 1100  WBC 7.9  RBC 4.43  HGB 13.2  HCT 41.3  MCV 93.2  MCH 29.8  MCHC 32.0  RDW 12.4  PLT 183   Cardiac EnzymesNo results for input(s): TROPONINI in the last 168 hours.  Recent Labs  Lab 12/17/17 1113  TROPIPOC 0.00    BNPNo results for input(s): BNP, PROBNP in the last 168 hours.  DDimer No results for input(s): DDIMER in the last 168 hours.  Radiology/Studies:  Dg Chest 2 View  Result Date: 12/17/2017 CLINICAL DATA:  Chest pain and pressure, some dizziness EXAM: CHEST - 2 VIEW COMPARISON:  Chest x-ray of 01/24/2016 FINDINGS: No active infiltrate or effusion is seen. Mediastinal and hilar contours are unremarkable and mild cardiomegaly is stable. A single AICD lead is present. No bony abnormality is evident. IMPRESSION: 1. No active lung disease. 2. Stable mild cardiomegaly with AICD lead present. Electronically  Signed   By: Dwyane Dee M.D.   On: 12/17/2017 11:44    Assessment and Plan:   1. Chest pressure/jaw discomfort/weakness - difficult to sort out if symptoms are related perhaps to orthostasis/hypotension or perhaps progressive obstructive CAD. She has chronically low BP but is  usually asymptomatic with this, so I feel suspicious this may represent angina. Today was different. Labs are nonacute and EKG is similar to prior. Losartan was increased in 09/2017 but no other med changes recently. Last dose of Xarelto was yesterday PM. Pending d/w MD.  2. CAD - as above, will review eval with MD.  3. Chronic systolic CHF - appears euvolemic. Follow with anticipated med adjustment.  4. CKD stage III - creatinine appears near baseline.  5. Paroxysmal atrial fib - maintaining NSR. Will discuss Xarelto with MD contingent on if procedure is planned.  6. PVCs - continue sotalol.  For questions or updates, please contact [facility] Please consult [email or web address] for contact info under Cardiology/STEMI.    Signed, Laurann Montana, PA-C  12/17/2017 1:45 PM  Patient seen with PA, agree with the above note.   She has been having exertional chest tightness with some radiation to the jaw.  Symptoms resolve with rest.  I do not think symptoms are related to low BP, her SBP is chronically in the 80s-90s and she has had no new meds recently.  She remains in NSR.  Given these classic symptoms and her history of CAD, I am concerned for progressive angina.  I think that she is going to require a cardiac cath to sort things out. Exam is benign, no volume overload.  - We discussed coronary angiography including risks/benefits.  She agrees to procedure.  Will plan tomorrow morning as she had Xarelto last night.  - Hold Xarelto pre-cath.  - I will continue her other cardiac meds for now.  Gentle pre-cath hydration.   - Initial troponin negative, will cycle.  Suspect TnI will be negative as she has had only exertional CP resolving with rest.   Marca Ancona 12/17/2017 3:19 PM

## 2017-12-18 ENCOUNTER — Encounter (HOSPITAL_COMMUNITY)
Admission: EM | Disposition: A | Discharge status: Home / Self Care | Payer: Self-pay | Source: Home / Self Care | Attending: Emergency Medicine

## 2017-12-18 DIAGNOSIS — R531 Weakness: Secondary | ICD-10-CM

## 2017-12-18 DIAGNOSIS — I208 Other forms of angina pectoris: Secondary | ICD-10-CM

## 2017-12-18 DIAGNOSIS — R079 Chest pain, unspecified: Secondary | ICD-10-CM

## 2017-12-18 HISTORY — PX: LEFT HEART CATH AND CORONARY ANGIOGRAPHY: CATH118249

## 2017-12-18 LAB — BASIC METABOLIC PANEL
Anion gap: 9 (ref 5–15)
BUN: 24 mg/dL — ABNORMAL HIGH (ref 6–20)
CO2: 25 mmol/L (ref 22–32)
Calcium: 9 mg/dL (ref 8.9–10.3)
Chloride: 104 mmol/L (ref 98–111)
Creatinine, Ser: 1.2 mg/dL — ABNORMAL HIGH (ref 0.44–1.00)
GFR calc Af Amer: 60 mL/min — ABNORMAL LOW (ref >60)
GFR calc non Af Amer: 51 mL/min — ABNORMAL LOW (ref >60)
Glucose, Bld: 102 mg/dL — ABNORMAL HIGH (ref 70–99)
Potassium: 4.2 mmol/L (ref 3.5–5.1)
Sodium: 138 mmol/L (ref 135–145)

## 2017-12-18 LAB — HIV ANTIBODY (ROUTINE TESTING W REFLEX): HIV Screen 4th Generation wRfx: NONREACTIVE

## 2017-12-18 LAB — CBC
HCT: 36.6 % (ref 36.0–46.0)
Hemoglobin: 11.8 g/dL — ABNORMAL LOW (ref 12.0–15.0)
MCH: 29.4 pg (ref 26.0–34.0)
MCHC: 32.2 g/dL (ref 30.0–36.0)
MCV: 91 fL (ref 80.0–100.0)
Platelets: 140 10*3/uL — ABNORMAL LOW (ref 150–400)
RBC: 4.02 MIL/uL (ref 3.87–5.11)
RDW: 12.4 % (ref 11.5–15.5)
WBC: 5.9 10*3/uL (ref 4.0–10.5)
nRBC: 0 % (ref 0.0–0.2)

## 2017-12-18 LAB — LIPID PANEL
Cholesterol: 129 mg/dL (ref 0–200)
HDL: 50 mg/dL (ref >40)
LDL Cholesterol: 70 mg/dL (ref 0–99)
Total CHOL/HDL Ratio: 2.6 RATIO
Triglycerides: 46 mg/dL (ref <150)
VLDL: 9 mg/dL (ref 0–40)

## 2017-12-18 LAB — TROPONIN I: Troponin I: <0.03 ng/mL (ref <0.03)

## 2017-12-18 SURGERY — LEFT HEART CATH AND CORONARY ANGIOGRAPHY
Anesthesia: LOCAL

## 2017-12-18 MED ORDER — SODIUM CHLORIDE 0.9% FLUSH: 3.0000 mL | INTRAVENOUS | Status: DC | PRN

## 2017-12-18 MED ORDER — FUROSEMIDE 20 MG PO TABS: 20.0000 mg | ORAL_TABLET | ORAL | Status: DC

## 2017-12-18 MED ORDER — ACETAMINOPHEN 325 MG PO TABS: 650.0000 mg | ORAL_TABLET | ORAL | Status: DC | PRN

## 2017-12-18 MED ORDER — HEPARIN (PORCINE) IN NACL 1000-0.9 UT/500ML-% IV SOLN
INTRAVENOUS | Status: AC
  Filled 2017-12-18: qty 1000

## 2017-12-18 MED ORDER — SODIUM CHLORIDE 0.9 % IV SOLN: 250.0000 mL | INTRAVENOUS | Status: DC | PRN

## 2017-12-18 MED ORDER — HEPARIN (PORCINE) IN NACL 1000-0.9 UT/500ML-% IV SOLN
INTRAVENOUS | Status: DC | PRN
  Administered 2017-12-18 (×2): 500 mL

## 2017-12-18 MED ORDER — FENTANYL CITRATE (PF) 100 MCG/2ML IJ SOLN
INTRAMUSCULAR | Status: DC | PRN
  Administered 2017-12-18: 25 ug via INTRAVENOUS

## 2017-12-18 MED ORDER — VERAPAMIL HCL 2.5 MG/ML IV SOLN
INTRAVENOUS | Status: DC | PRN
  Administered 2017-12-18: 10 mL via INTRA_ARTERIAL

## 2017-12-18 MED ORDER — LIDOCAINE HCL (PF) 1 % IJ SOLN
INTRAMUSCULAR | Status: DC | PRN
  Administered 2017-12-18: 2 mL via SUBCUTANEOUS

## 2017-12-18 MED ORDER — MIDAZOLAM HCL 2 MG/2ML IJ SOLN
INTRAMUSCULAR | Status: AC
  Filled 2017-12-18: qty 2

## 2017-12-18 MED ORDER — RIVAROXABAN 20 MG PO TABS: 20.0000 mg | ORAL_TABLET | Freq: Every day | ORAL | Status: DC

## 2017-12-18 MED ORDER — HEPARIN SODIUM (PORCINE) 1000 UNIT/ML IJ SOLN
INTRAMUSCULAR | Status: DC | PRN
  Administered 2017-12-18: 3500 [IU] via INTRAVENOUS

## 2017-12-18 MED ORDER — ONDANSETRON HCL 4 MG/2ML IJ SOLN: 4.0000 mg | Freq: Four times a day (QID) | INTRAMUSCULAR | Status: DC | PRN

## 2017-12-18 MED ORDER — POTASSIUM CHLORIDE CRYS ER 20 MEQ PO TBCR: 40.0000 meq | EXTENDED_RELEASE_TABLET | ORAL | Status: DC

## 2017-12-18 MED ORDER — SODIUM CHLORIDE 0.9 % WEIGHT BASED INFUSION: 1.0000 mL/kg/h | INTRAVENOUS | Status: DC

## 2017-12-18 MED ORDER — VERAPAMIL HCL 2.5 MG/ML IV SOLN
INTRAVENOUS | Status: AC
  Filled 2017-12-18: qty 2

## 2017-12-18 MED ORDER — SODIUM CHLORIDE 0.9% FLUSH: 3.0000 mL | Freq: Two times a day (BID) | INTRAVENOUS | Status: DC

## 2017-12-18 MED ORDER — IOHEXOL 350 MG/ML SOLN
INTRAVENOUS | Status: DC | PRN
  Administered 2017-12-18: 50 mL via INTRA_ARTERIAL

## 2017-12-18 MED ORDER — MIDAZOLAM HCL 2 MG/2ML IJ SOLN
INTRAMUSCULAR | Status: DC | PRN
  Administered 2017-12-18: 1 mg via INTRAVENOUS

## 2017-12-18 MED ORDER — LIDOCAINE HCL (PF) 1 % IJ SOLN
INTRAMUSCULAR | Status: AC
  Filled 2017-12-18: qty 30

## 2017-12-18 MED ORDER — FENTANYL CITRATE (PF) 100 MCG/2ML IJ SOLN
INTRAMUSCULAR | Status: AC
  Filled 2017-12-18: qty 2

## 2017-12-18 SURGICAL SUPPLY — 8 items
CATH 5FR JL3.5 JR4 ANG PIG MP (CATHETERS) ×2
DEVICE RAD COMP TR BAND LRG (VASCULAR PRODUCTS) ×2
GLIDESHEATH SLEND SS 6F .021 (SHEATH) ×2
INQWIRE 1.5J .035X260CM (WIRE) ×2
KIT HEART LEFT (KITS) ×2
PACK CARDIAC CATHETERIZATION (CUSTOM PROCEDURE TRAY) ×2
TRANSDUCER W/STOPCOCK (MISCELLANEOUS) ×2
TUBING CIL FLEX 10 FLL-RA (TUBING) ×2

## 2017-12-18 NOTE — Progress Notes (Addendum)
Patient ID: Catherine Mays, female   DOB: [birth year], 52 y.o.   MRN: [id number]     Advanced Heart Failure Rounding Note  PCP-Cardiologist: Verne Carrow, MD   Subjective:    Cath this morning with no obstructive disease, patent LAD.  No further chest pain.    Objective:   Weight Range: 68.4 kg Body mass index is 22.27 kg/m.   Vital Signs:   Temp:  [98 F (36.7 C)-98.1 F (36.7 C)] 98.1 F (36.7 C) (10/25 0558) Pulse Rate:  [46-57] 54 (10/25 0754) Resp:  [12-20] 15 (10/25 0754) BP: (85-105)/(55-80) 102/65 (10/25 0754) SpO2:  [95 %-100 %] 100 % (10/25 0754) Weight:  [66.2 kg-68.4 kg] 68.4 kg (10/25 0500)    Weight change: Filed Weights   12/17/17 1106 12/17/17 1700 12/18/17 0500  Weight: 66.7 kg 66.2 kg 68.4 kg    Intake/Output:   Intake/Output Summary (Last 24 hours) at 12/18/2017 0805 Last data filed at 12/17/2017 1716 Gross per 24 hour  Intake 3 ml  Output -  Net 3 ml      Physical Exam    General:  Well appearing. No resp difficulty HEENT: Normal Neck: Supple. JVP not elevated. Carotids 2+ bilat; no bruits. No lymphadenopathy or thyromegaly appreciated. Cor: PMI nondisplaced. Regular rate & rhythm. No rubs, gallops or murmurs. Lungs: Clear Abdomen: Soft, nontender, nondistended. No hepatosplenomegaly. No bruits or masses. Good bowel sounds. Extremities: No cyanosis, clubbing, rash, edema Neuro: Alert & orientedx3, cranial nerves grossly intact. moves all 4 extremities w/o difficulty. Affect pleasant   Telemetry   NSR in 50s, occasional PVCs (personally reviewed).   Labs    CBC Recent Labs    12/17/17 1100 12/18/17 0509  WBC 7.9 5.9  HGB 13.2 11.8*  HCT 41.3 36.6  MCV 93.2 91.0  PLT 183 140*   Basic Metabolic Panel Recent Labs    40/98/11 1100 12/18/17 0509  NA 137 138  K 4.2 4.2  CL 101 104  CO2 29 25  GLUCOSE 107* 102*  BUN 21* 24*  CREATININE 1.24* 1.20*  CALCIUM 9.9 9.0   Liver Function Tests Recent Labs   12/17/17 1548  AST 23  ALT 15  ALKPHOS 64  BILITOT 1.2  PROT 7.0  ALBUMIN 4.2   No results for input(s): LIPASE, AMYLASE in the last 72 hours. Cardiac Enzymes Recent Labs    12/17/17 1548 12/17/17 2148 12/18/17 0509  TROPONINI <0.03 <0.03 <0.03    BNP: BNP (last 3 results) No results for input(s): BNP in the last 8760 hours.  ProBNP (last 3 results) No results for input(s): PROBNP in the last 8760 hours.   D-Dimer No results for input(s): DDIMER in the last 72 hours. Hemoglobin A1C No results for input(s): HGBA1C in the last 72 hours. Fasting Lipid Panel Recent Labs    12/18/17 0509  CHOL 129  HDL 50  LDLCALC 70  TRIG 46  CHOLHDL 2.6   Thyroid Function Tests Recent Labs    12/17/17 1548  TSH 0.231*    Other results:   Imaging    Dg Chest 2 View  Result Date: 12/17/2017 CLINICAL DATA:  Chest pain and pressure, some dizziness EXAM: CHEST - 2 VIEW COMPARISON:  Chest x-ray of 01/24/2016 FINDINGS: No active infiltrate or effusion is seen. Mediastinal and hilar contours are unremarkable and mild cardiomegaly is stable. A single AICD lead is present. No bony abnormality is evident. IMPRESSION: 1. No active lung disease. 2. Stable mild cardiomegaly with AICD lead present. Electronically Signed  By: Dwyane Dee M.D.   On: 12/17/2017 11:44      Medications:     Scheduled Medications: . [MAR Hold] aspirin EC  81 mg Oral Daily  . [MAR Hold] furosemide  20 mg Oral Daily  . [MAR Hold] levothyroxine  125 mcg Oral q morning - 10a  . [MAR Hold] loratadine  10 mg Oral Daily  . [MAR Hold] losartan  25 mg Oral QHS  . [MAR Hold] pantoprazole  40 mg Oral Daily  . [MAR Hold] potassium chloride SA  40 mEq Oral Daily  . [MAR Hold] rosuvastatin  10 mg Oral Daily  . sodium chloride flush  3 mL Intravenous Q12H  . [MAR Hold] sodium chloride flush  3 mL Intravenous Q12H  . [MAR Hold] sotalol  160 mg Oral BID  . [MAR Hold] spironolactone  25 mg Oral Daily  . [MAR  Hold] triamcinolone  2 spray Nasal Daily     Infusions: . sodium chloride    . [MAR Hold] sodium chloride    . sodium chloride 50 mL/hr at 12/18/17 0530     PRN Medications:  sodium chloride, [MAR Hold] sodium chloride, [MAR Hold] acetaminophen, [MAR Hold] nitroGLYCERIN, [MAR Hold] ondansetron (ZOFRAN) IV, sodium chloride flush, [MAR Hold] sodium chloride flush, [MAR Hold] traZODone   Assessment/Plan   1. CAD: Patient had missed anterior MI with stent to LAD.  Recently, has been having exertional chest tightness.  Cath today showed no obstructive CAD, patent stent.  - Continue statin.  - No aspirin as she is chronically on Xarelto.   2. Hypothyroidism: TSH is suppressed, may need PCP to adjust levothyroxine.  3. Chronic systolic CHF: Ischemic cardiomyopathy, has St Jude ICD.  Not volume overloaded on exam.  SBP in 90s but this is chronic for her.  - Continue home Coreg, losartan, spironolactone, Lasix.  - Will interrogate ICD to make sure no arrhythmias or excessive V-pacing.  4. Atrial fibrillation: Paroxysmal.  In NSR here.  She can restart her home Xarelto this evening.   She can go home today.   Length of Stay: 0  Marca Ancona, MD  12/18/2017, 8:05 AM  Advanced Heart Failure Team Pager [phone] (M-F; 7a - 4p)  Please contact [facility] for night-coverage after hours (4p -7a ) and weekends on [email or web address]

## 2017-12-18 NOTE — Interval H&P Note (Signed)
History and Physical Interval Note:  12/18/2017 7:30 AM  Catherine Mays  has presented today for surgery, with the diagnosis of ua  The various methods of treatment have been discussed with the patient and family. After consideration of risks, benefits and other options for treatment, the patient has consented to  Procedure(s): LEFT HEART CATH AND CORONARY ANGIOGRAPHY (N/A) as a surgical intervention .  The patient's history has been reviewed, patient examined, no change in status, stable for surgery.  I have reviewed the patient's chart and labs.  Questions were answered to the patient's satisfaction.     Princessa Lesmeister [employer]

## 2017-12-18 NOTE — Discharge Instructions (Signed)
Radial Site Care Refer to this sheet in the next few weeks. These instructions provide you with information about caring for yourself after your procedure. Your health care provider may also give you more specific instructions. Your treatment has been planned according to current medical practices, but problems sometimes occur. Call your health care provider if you have any problems or questions after your procedure. What can I expect after the procedure? After your procedure, it is typical to have the following:  Bruising at the radial site that usually fades within 1-2 weeks.  Blood collecting in the tissue (hematoma) that may be painful to the touch. It should usually decrease in size and tenderness within 1-2 weeks.  Follow these instructions at home:  Take medicines only as directed by your health care provider.  You may shower 24-48 hours after the procedure or as directed by your health care provider. Remove the bandage (dressing) and gently wash the site with plain soap and water. Pat the area dry with a clean towel. Do not rub the site, because this may cause bleeding.  Do not take baths, swim, or use a hot tub until your health care provider approves.  Check your insertion site every day for redness, swelling, or drainage.  Do not apply powder or lotion to the site.  Do not flex or bend the affected arm for 24 hours or as directed by your health care provider.  Do not push or pull heavy objects with the affected arm for 24 hours or as directed by your health care provider.  Do not lift over 10 lb (4.5 kg) for 5 days after your procedure or as directed by your health care provider.  Ask your health care provider when it is okay to: ? Return to work or school. ? Resume usual physical activities or sports. ? Resume sexual activity.  Do not drive home if you are discharged the same day as the procedure. Have someone else drive you.  You may drive 2 days after the procedure  unless otherwise instructed by your health care provider.  Do not operate machinery or power tools for 24 hours after the procedure.  If your procedure was done as an outpatient procedure, which means that you went home the same day as your procedure, a responsible adult should be with you for the first 24 hours after you arrive home.  Keep all follow-up visits as directed by your health care provider. This is important. Contact a health care provider if:  You have a fever.  You have chills.  You have increased bleeding from the radial site. Hold pressure on the site. Call 911. Get help right away if:  You have unusual pain at the radial site.  You have redness, warmth, or swelling at the radial site.  You have drainage (other than a small amount of blood on the dressing) from the radial site.  The radial site is bleeding, and the bleeding does not stop after 30 minutes of holding steady pressure on the site.  Your arm or hand becomes pale, cool, tingly, or numb. This information is not intended to replace advice given to you by your health care provider. Make sure you discuss any questions you have with your health care provider. Document Released: 03/15/2010 Document Revised: 07/19/2015 Document Reviewed: 08/29/2013 Elsevier Interactive Patient Education  2018 [employer].

## 2017-12-18 NOTE — Discharge Summary (Addendum)
Discharge Summary    Patient ID: Catherine Mays MRN: [id number]; DOB: 1965/09/17  Admit date: 12/17/2017 Discharge date: 12/18/2017  Primary Care Provider: Benita Stabile, MD  Primary Cardiologist: Verne Carrow, MD  Primary Electrophysiologist:  Will Jorja Loa, MD   Discharge Diagnoses    Principal Problem:   Weakness Active Problems:   PAF (paroxysmal atrial fibrillation) (HCC)   Chronic systolic CHF (congestive heart failure) (HCC)   Ischemic cardiomyopathy   Hypotension   CAD in native artery   Sensation of chest pressure   Allergies Allergies  Allergen Reactions  . Paxil [Paroxetine] Palpitations  . Morphine And Related Nausea And Vomiting  . Percocet [Oxycodone-Acetaminophen] Nausea And Vomiting  . Cefdinir Nausea Only  . Zolpidem Other (See Comments)    Doesn't work for patient at all    Diagnostic Studies/Procedures    Cardiac Cath 12/18/17 Coronary Findings   Diagnostic  Dominance: Right  Left Main  No significant disease.  Left Anterior Descending  Patent proximal LAD stent. No obstructive disease.  Left Circumflex  No significant disease.  Right Coronary Artery  No significant disease.   Intervention  No interventions have been documented.    _____________   History of Present Illness     Catherine Mays is a 52 y/o  CAD (STEMI 08/2015 s/p PTCA/DES to LAD, mild residual mRCA), ICM/chronic systolic CHF EF 30-35% at time of STEMI, [employer] ICD, paroxysmal atrial fib (dx at time of STEMI), PVCs (s/p ablation with recurrence, on sotalol), pre-diabetes, chronic hypotension, probable CKD stage III who presented to [facility] with leg weakness and exertional chest pressure.  Catherine Mays has a history of late presenting STEMI in 08/2015. She had initially presented to [facility] with upper chest pain/arm pain, nausea and vomiting. Troponin was negative and she was discharged with presumed GI pain. She returned back to the ER where  she was found to have anterior STEMI. Symptoms had begun 8 hours earlier. She was transferred emergently to [facility] and underwent PTCA/DES to LAD. She had a late presentation to the cath lab. She underwent relook cath 09/20/17 for contiued pain which demonstrating patency of the stent. Echo had shown EF 30-35%.She had peri-MI atrial fibrillation and was started on Xarelto and amiodarone. She was discharged with LifeVest and ASA/Brilinta/Xarelto. ASA was discontinued after 1 month and Brilinta was changed to Plavix.  After discharge, she continued to feel poorly with nausea, anorexia, and significant fatigue.She was admitted for evaluation in 10/17 given concern for low output heart failure. However, stopping amiodarone essentially resolved her symptoms. She had RHC showing preserved cardiac output but the PCWP was high due to prominent v-waves, presumably from significant mitral regurgitation.Of note, she was in atrial fibrillation for a time during this admission. 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 She had an ICD placed in 12/2015. She has been followed by Dr. Elberta Fortis and noted to have very frequent PVCs on device interrogation.Holter in 12/18 showed 41% PVCs.She was started on sotalol 80 mg bid and titrated up to 120 mg bid.Coreg was stopped with the addition of sotalol and losartan was decreased to 12.5 mg daily due to low BP She had PVC ablation in 2/19 but PVCs recurred. Sotalol was increased to 160 mg BID. Last echocardiogram 10/05/17 showed EF 30% with akinesis of the inferoseptum and anteroseptum, apical inferior and apical anterior akinesis, akinesis of apex, grade 2 DD, moderate mitral  regurgitation. 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  She states after sotalol was increased this year she felt the best she has since the original MI. In August 2019 her BP, albeit chronically low,  was felt to tolerate increase in losartan. She has noticed the last week she was unusually fatigued. Over the weekend she [place] but felt wiped out the following day all day. Yesterday while getting ready for work she felt she had "jello legs" which she had not felt for a long time. She also began to feel mild chest pressure and nausea. At one point she felt as though she would have to go to the bathroom for diarrhea but did not have to. She sat down and symptoms improved. However, each successive time she would get up and walk around, the "jello legs" and chest pressure would return, as well as left sided jaw pain. She went onto work in hopes of feeling better but this again returned with exertion. She tried to check BP during symptoms and got an "error" each time. She called her husband who drove her to the ER. In the waiting room symptoms recurred while walking to the vending machine but did not occur while walking to her ER room. She felt fine at rest. Labs showed normal CBC, negative troponin x 1, Cr 1.24 (with prior baseline 1.1-1.2). CXR NAD, stable mild cardiomegaly. EKG nonacute, occasional wide complex beats either representing PVC or junctional fusion beat. Upon arrival, BP 92/59 with lowest reading 85/55, but patient states this is not unusual for her - she is usually asymptomatic even with softer BP. Appetite hasn't been the greatest this week.  Hospital Course      1. Chest pressure/jaw discomfort/weakness - she ruled out for MI. She underwent cardiac cath yesterday which showed a patent proximal LAD stent without obstructive disease otherwise. Telemetry was stable overnight. Her device was interrogated which was largely unremarkable. Rep did note the last 2 days her volume status appeared dehydrated. Per Dr. Shirlee Latch, Lasix was decreased to every other day along with potassium. She will f/u in the CHF clinic as previously scheduled 01/11/18. I did not send in an  rx for her KCl and Lasix as she has these on hand, and it is not yet certain if this will remain her permanent dose- this should be readdressed in f/u and updated rx sent to pharmacy if these doses are to remain fixed.  2. CAD - stable as above. Not on ASA at home given concomitant Xarelto. No current indication to resume as OP.  3. Chronic systolic CHF - appeared euvolemic. Follow with med adjustment as above.  4. CKD stage III - creatinine appears near baseline.  5. Paroxysmal atrial fib - maintained NSR. Xarelto was held in prep for cath and can be resumed this PM.   6. PVCs - continued on sotalol.  7. Suppressed TSH - TSH was noted to be suppressed at 0.231. Dr. Shirlee Latch recommended f/u free T4 and TSH repeated at time of DC. These are still pending and should be reviewed after DC.  8. Mild anemia/thrombocytopenia - no bleeding reported. Hgb was 13.2 on admission which was higher than prior baseline of 11.8-12.4, possibly due to dehydration. She was advised to see her PCP within a few days of discharge for recheck.  Dr. Shirlee Latch has seen and examined the patient today and feels she is stable for discharge. Discussed with  nursing staff at time of DC about returning to work - typical release after radial cath without restrictions would be about 5 days, but patient wishes to go back on Monday 10/28. This would be OK as long as she is feeling well, with prior radial cath lifting restrictions in place until 5 days post-procedure. _____________  Discharge Vitals Blood pressure 100/65, pulse (!) 58, temperature 98.6 F (37 C), temperature source Oral, resp. rate 16, height 5\' 9"  (1.753 m), weight 68.4 kg, SpO2 98 %.  Filed Weights   12/17/17 1106 12/17/17 1700 12/18/17 0500  Weight: 66.7 kg 66.2 kg 68.4 kg    Labs & Radiologic Studies    CBC Recent Labs    12/17/17 1100 12/18/17 0509  WBC 7.9 5.9  HGB 13.2 11.8*  HCT 41.3 36.6  MCV 93.2 91.0  PLT 183 140*   Basic Metabolic  Panel Recent Labs    12/17/17 1100 12/18/17 0509  NA 137 138  K 4.2 4.2  CL 101 104  CO2 29 25  GLUCOSE 107* 102*  BUN 21* 24*  CREATININE 1.24* 1.20*  CALCIUM 9.9 9.0   Liver Function Tests Recent Labs    12/17/17 1548  AST 23  ALT 15  ALKPHOS 64  BILITOT 1.2  PROT 7.0  ALBUMIN 4.2    Cardiac Enzymes Recent Labs    12/17/17 1548 12/17/17 2148 12/18/17 0509  TROPONINI <0.03 <0.03 <0.03   Fasting Lipid Panel Recent Labs    12/18/17 0509  CHOL 129  HDL 50  LDLCALC 70  TRIG 46  CHOLHDL 2.6   Thyroid Function Tests Recent Labs    12/17/17 1548  TSH 0.231*   _____________  Dg Chest 2 View  Result Date: 12/17/2017 CLINICAL DATA:  Chest pain and pressure, some dizziness EXAM: CHEST - 2 VIEW COMPARISON:  Chest x-ray of 01/24/2016 FINDINGS: No active infiltrate or effusion is seen. Mediastinal and hilar contours are unremarkable and mild cardiomegaly is stable. A single AICD lead is present. No bony abnormality is evident. IMPRESSION: 1. No active lung disease. 2. Stable mild cardiomegaly with AICD lead present. Electronically Signed   By: Dwyane Dee M.D.   On: 12/17/2017 11:44   Disposition   Pt is being discharged home today in good condition.  Follow-up Plans & Appointments    Follow-up Information    Benita Stabile, MD Follow up.   Specialty:  Internal Medicine Why:  Your blood count & platelet count were slightly lower today but similar to prior values 2 months ago. We would recommend a recheck/visit with your primary care doctor in a few days. Your thyroid also appeared to be suppressed and needs to be followed up. Contact information: [address] [place] [facility] [address] [phone]        [name], MD Follow up.   Specialty:  Cardiology Why:  Keep followup appointment as previously scheduled. Contact information: [address] [place] [address] [phone]          Discharge Instructions    Diet - low sodium  heart healthy   Complete by:  As directed    Discharge instructions   Complete by:  As directed    Your Lasix and potassium were decreased to every other day. We did not yet send an updated prescription to your pharmacy as it is not yet clear if this will remain your steady dose for now. This can be discussed at time of follow-up with Dr. Shirlee Latch.  You may restart your Xarelto this evening.   Increase activity slowly   Complete by:  As directed    No driving for 2 days. No lifting over 5 lbs for 1 week. No sexual activity for 1 week. Keep procedure site clean & dry. If you notice increased pain, swelling, bleeding or pus, call/return!  You may shower, but no soaking baths/hot tubs/pools for 1 week.      Discharge Medications   Allergies as of 12/18/2017      Reactions   Paxil [paroxetine] Palpitations   Morphine And Related Nausea And Vomiting   Percocet [oxycodone-acetaminophen] Nausea And Vomiting   Cefdinir Nausea Only   Zolpidem Other (See Comments)   Doesn't work for patient at all      Medication List    TAKE these medications   CALCIUM + D3 PO Take 1 tablet by mouth every morning.   CoQ10 200 MG Caps Take 200 mg by mouth daily.   fexofenadine 180 MG tablet Commonly known as:  ALLEGRA Take 180 mg by mouth daily.   furosemide 20 MG tablet Commonly known as:  LASIX Take 1 tablet (20 mg total) by mouth every other day. Start taking on:  12/19/2017 What changed:  when to take this   losartan 25 MG tablet Commonly known as:  COZAAR Take 1 tablet (25 mg total) by mouth at bedtime.   NASACORT ALLERGY 24HR 55 MCG/ACT Aero nasal inhaler Generic drug:  triamcinolone Place 2 sprays into the nose daily.   nitroGLYCERIN 0.4 MG SL tablet Commonly known as:  NITROSTAT Place 1 tablet (0.4 mg total) under the tongue every 5 (five) minutes as needed for chest pain.   pantoprazole 40 MG tablet Commonly known as:  PROTONIX TAKE 1 TABLET BY MOUTH EVERY DAY   potassium  chloride SA 20 MEQ tablet Commonly known as:  K-DUR,KLOR-CON Take 2 tablets (40 mEq total) by mouth every other day. Start taking on:  12/19/2017 What changed:  when to take this   rosuvastatin 10 MG tablet Commonly known as:  CRESTOR Take 1 tablet (10 mg total) by mouth daily.   sotalol 160 MG tablet Commonly known as:  BETAPACE TAKE 1 TABLET BY MOUTH TWICE DAILY   spironolactone 25 MG tablet Commonly known as:  ALDACTONE Take 1 tablet (25 mg total) by mouth daily.   SYNTHROID 125 MCG tablet Generic drug:  levothyroxine Take 125 mcg by mouth every morning.   traZODone 50 MG tablet Commonly known as:  DESYREL Take 0.5 tablets (25 mg total) by mouth at bedtime as needed for sleep.   XARELTO 20 MG Tabs tablet Generic drug:  rivaroxaban TAKE 1 TABLET EVERY DAY WITH SUPPER What changed:  See the new instructions.        Acute coronary syndrome (MI, NSTEMI, STEMI, etc) this admission?:  No.   Outstanding Labs/Studies   N/A  Duration of Discharge Encounter   Greater than 30 minutes including physician time.  Signed, Laurann Montana, PA-C 12/18/2017, 10:26 AM

## 2017-12-21 ENCOUNTER — Encounter (HOSPITAL_COMMUNITY): Payer: Self-pay | Admitting: Cardiology

## 2017-12-22 ENCOUNTER — Ambulatory Visit (INDEPENDENT_AMBULATORY_CARE_PROVIDER_SITE_OTHER): Payer: BLUE CROSS/BLUE SHIELD | Admitting: *Deleted

## 2017-12-22 DIAGNOSIS — I255 Ischemic cardiomyopathy: Secondary | ICD-10-CM | POA: Diagnosis not present

## 2017-12-22 NOTE — Progress Notes (Signed)
Remote ICD transmission.   

## 2018-01-08 ENCOUNTER — Encounter (HOSPITAL_COMMUNITY): Payer: BLUE CROSS/BLUE SHIELD | Admitting: Cardiology

## 2018-01-11 ENCOUNTER — Encounter (HOSPITAL_COMMUNITY): Payer: Self-pay | Admitting: Cardiology

## 2018-01-11 ENCOUNTER — Ambulatory Visit (HOSPITAL_COMMUNITY)
Admission: RE | Admit: 2018-01-11 | Discharge: 2018-01-11 | Disposition: A | Discharge status: Home / Self Care | Payer: BLUE CROSS/BLUE SHIELD | Source: Ambulatory Visit | Attending: Cardiology | Admitting: Cardiology

## 2018-01-11 VITALS — BP 80/59 | HR 48 | Wt 151.4 lb

## 2018-01-11 DIAGNOSIS — Z7901 Long term (current) use of anticoagulants: Secondary | ICD-10-CM | POA: Diagnosis not present

## 2018-01-11 DIAGNOSIS — I480 Paroxysmal atrial fibrillation: Secondary | ICD-10-CM | POA: Diagnosis not present

## 2018-01-11 DIAGNOSIS — I2510 Atherosclerotic heart disease of native coronary artery without angina pectoris: Secondary | ICD-10-CM | POA: Diagnosis not present

## 2018-01-11 DIAGNOSIS — Z79899 Other long term (current) drug therapy: Secondary | ICD-10-CM | POA: Insufficient documentation

## 2018-01-11 DIAGNOSIS — Z8249 Family history of ischemic heart disease and other diseases of the circulatory system: Secondary | ICD-10-CM | POA: Diagnosis not present

## 2018-01-11 DIAGNOSIS — I255 Ischemic cardiomyopathy: Secondary | ICD-10-CM | POA: Diagnosis not present

## 2018-01-11 DIAGNOSIS — Z955 Presence of coronary angioplasty implant and graft: Secondary | ICD-10-CM | POA: Diagnosis not present

## 2018-01-11 DIAGNOSIS — I252 Old myocardial infarction: Secondary | ICD-10-CM | POA: Diagnosis not present

## 2018-01-11 DIAGNOSIS — I340 Nonrheumatic mitral (valve) insufficiency: Secondary | ICD-10-CM | POA: Diagnosis not present

## 2018-01-11 DIAGNOSIS — I493 Ventricular premature depolarization: Secondary | ICD-10-CM | POA: Diagnosis not present

## 2018-01-11 DIAGNOSIS — E785 Hyperlipidemia, unspecified: Secondary | ICD-10-CM | POA: Insufficient documentation

## 2018-01-11 DIAGNOSIS — I5022 Chronic systolic (congestive) heart failure: Secondary | ICD-10-CM | POA: Insufficient documentation

## 2018-01-11 DIAGNOSIS — Z95810 Presence of automatic (implantable) cardiac defibrillator: Secondary | ICD-10-CM | POA: Insufficient documentation

## 2018-01-11 DIAGNOSIS — Z7951 Long term (current) use of inhaled steroids: Secondary | ICD-10-CM | POA: Insufficient documentation

## 2018-01-11 NOTE — Progress Notes (Signed)
PCP: Dr. Margo Aye HF Cardiology: Dr. Shirlee Latch  52 y.o. with history of CAD s/p anterior STEMI with DES to LAD, ischemic cardiomyopathy, mitral regurgitation, and paroxysmal atrial fibrillation presents for followup of CHF and CAD.  She was admitted in 7/17 with anterior STEMI.  She had a late presentation to the cath lab.  Echo in 10/17 showed EF about 25% with LAD-territory wall motion abnormalities.  She had peri-MI atrial fibrillation and was started on Xarelto.  She was put on amiodarone.   After discharge, she continued to feel poorly.  She developed nausea, anorexia, and significant fatigue.  She was admitted for evaluation in 10/17 given concern for low output heart failure.  However, stopping amiodarone essentially resolved her symptoms.  She had RHC showing preserved cardiac output but the PCWP was high due to prominent v-waves, presumably from significant mitral regurgitation.  Of note, she was in atrial fibrillation for a time during this admission.   She had St Jude ICD placed.  She is back at work for the [employer]   TEE in 8/18 to evaluate the mitral valve showed EF 30%, moderate MR (probably functional).    Patient was seen by Dr. Elberta Fortis and noted to have very frequent PVCs on device interrogation.  Holter in 12/18 showed 41% PVCs.  She was started on sotalol 80 mg bid and titrated up to 120 mg bid.  Coreg was stopped with the addition of sotalol and losartan was decreased to 12.5 mg daily due to low BP.  She had PVC ablation in 2/19 but PVCs recurred. Sotalol was increased to 160 mg bid.   Echo 8/19 showed EF 30% with regional wall motion abnormalities, normal RV size and systolic function, moderate MR.   She was admitted in 10/19 with chest pain.  Cath showed patent LAD stent and no obstructive CAD.   St Jude device interrogated today: No VT, thoracic impedance stable.   ECG (personally reviewed): sinus brady at 46, left axis deviation, poor RWP, QTc 409 msec    Labs  (10/17): K 3.9, creatinine 1.24, hgb 10.3 Labs (11/17): K 4, creatinine 1.27 Labs (12/17): K 4.2, creatinine 1.12, BNP 439 Labs (2/18): K 4.7, creatinine 1.37 Labs (7/18): K 3.9, creatinine 1.14, hgb 11.7 Labs (12/18): K 4.5, creatinine 1.23 Labs (1/19): K 4.4, creatinine 1.14 Labs (2/19): LDL 97 (off atorvastatin), HDL 56 Labs (5/19): K 4.3, creatinine 1.1 Labs (10/19): K 4.2, creatinine 1.2, LDL 70, hgb 11.8 Labs (11/19): K 4.9, creatinine 1.17, hgb 11.8, LDL 68, low TSH  PMH:  1. CAD: Anterior STEMI in 7/17 with late presentation to the cath lab. She had DES to proximal LAD.  No significant disease in other vessels.  - LHC (10/19): Patent LAD stent, no obstructive disease.  2. Chronic systolic CHF: Ischemic cardiomyopathy.  - Echo 10/17 with EF 25%, akinesis of the mid-apical anteroseptal and anterior walls, apical dyskinesis, moderate to severe MR with incomplete coaptation.  - RHC (10/17): mean RA 4, PA 59/17 mean 38, mean PCWP 27 with prominent V waves suggesting significant MR, CI 3.06, PVR 2 WU.  - ACEI cough.  - Echo (11/17): EF 30-35%, normal RV size and systolic function, severe MR with restriction of posterior leaflet.   - Echo (8/19): EF 30% with regional wall motion abnormalities, normal RV size and systolic function, moderate MR.  3. Atrial fibrillation: Paroxysmal.  Noted 7/17 admission peri-MI, also noted again 10/17 admission. She did not tolerate amiodarone due to nausea/anorexia.  4. Mitral regurgitation:  Moderate to severe on 10/17 echo, incomplete coaptation.  ?Etiology, her MI (anterior) does not typically lead to ischemic MR.   - Echo in 11/17 with severe MR, posterior leaflet restricted.  - TEE (8/18): EF 30%, moderately dilated LV with akinetic septal and anterior walls, moderate functional MR, normal RV size and systolic function.  - Echo (8/19): Moderate MR 5. Hyperlipidemia: Myalgias with atorvastatin.  6. PVCs: Holter 12/18 with 41% PVCs.  - PVC ablation in  2/19 with recurrence of PVCs.   SH: Married, lives in [place], works for the [employer], no smoking or ETOH.   Family History  Problem Relation Age of Onset  . Heart attack Father   . Arrhythmia Mother   . Colon cancer Neg Hx   . Colon polyps Neg Hx    ROS: All systems reviewed and negative except as per HPI.   Current Outpatient Medications on File Prior to Encounter  Medication Sig Dispense Refill  . Calcium Carb-Cholecalciferol (CALCIUM + D3 PO) Take 1 tablet by mouth every morning.    . Coenzyme Q10 (COQ10) 200 MG CAPS Take 200 mg by mouth daily.    . fexofenadine (ALLEGRA) 180 MG tablet Take 180 mg by mouth daily.    . furosemide (LASIX) 20 MG tablet Take 1 tablet (20 mg total) by mouth every other day.    . losartan (COZAAR) 25 MG tablet Take 1 tablet (25 mg total) by mouth at bedtime. 90 tablet 3  . nitroGLYCERIN (NITROSTAT) 0.4 MG SL tablet Place 1 tablet (0.4 mg total) under the tongue every 5 (five) minutes as needed for chest pain. 25 tablet 3  . pantoprazole (PROTONIX) 40 MG tablet TAKE 1 TABLET BY MOUTH EVERY DAY 90 tablet 3  . potassium chloride SA (K-DUR,KLOR-CON) 20 MEQ tablet Take 2 tablets (40 mEq total) by mouth every other day.    . rosuvastatin (CRESTOR) 10 MG tablet Take 1 tablet (10 mg total) by mouth daily. 90 tablet 3  . sotalol (BETAPACE) 160 MG tablet TAKE 1 TABLET BY MOUTH TWICE DAILY (Patient taking differently: Take 160 mg by mouth 2 (two) times daily. ) 60 tablet 10  . spironolactone (ALDACTONE) 25 MG tablet Take 1 tablet (25 mg total) by mouth daily. 30 tablet 3  . SYNTHROID 125 MCG tablet Take 125 mcg by mouth every morning.   5  . traZODone (DESYREL) 50 MG tablet Take 0.5 tablets (25 mg total) by mouth at bedtime as needed for sleep. 30 tablet 0  . triamcinolone (NASACORT ALLERGY 24HR) 55 MCG/ACT AERO nasal inhaler Place 2 sprays into the nose daily.     Carlena Hurl 20 MG TABS tablet TAKE 1 TABLET EVERY DAY WITH SUPPER (Patient taking differently: Take 20 mg by  mouth daily with supper. ) 90 tablet 2   Current Facility-Administered Medications on File Prior to Encounter  Medication Dose Route Frequency Provider Last Rate Last Dose  . bivalirudin (ANGIOMAX) 250 mg in sodium chloride 0.9 % 50 mL (5 mg/mL) infusion    Continuous PRN Kathleene Hazel, MD   Stopped at 09/20/15 [phone]  . bivalirudin (ANGIOMAX) BOLUS via infusion    PRN Kathleene Hazel, MD   54.45 mg at 09/20/15 5409  . fentaNYL (SUBLIMAZE) injection    PRN Kathleene Hazel, MD   25 mcg at 09/20/15 0651  . heparin 1,500 mL    PRN Kathleene Hazel, MD      . heparin infusion 2 units/mL in 0.9 % sodium chloride  Continuous PRN Kathleene Hazel, MD   1,500 mL at 09/20/15 0733  . iopamidol (ISOVUE-370) 76 % injection    PRN Kathleene Hazel, MD   165 mL at 09/20/15 0733  . lidocaine (PF) (XYLOCAINE) 1 % injection    PRN Kathleene Hazel, MD   2 mL at 09/20/15 1610  . midazolam (VERSED) injection    PRN Kathleene Hazel, MD   1 mg at 09/20/15 0651  . nitroGLYCERIN 1 mg/10 ml (100 mcg/ml) - IR/CATH LAB    PRN Kathleene Hazel, MD   200 mcg at 09/20/15 9604  . Radial Cocktail/Verapamil only    PRN Kathleene Hazel, MD   10 mL at 09/20/15 0629  . ticagrelor (BRILINTA) tablet    PRN Kathleene Hazel, MD   180 mg at 09/20/15 [phone]  . tirofiban (AGGRASTAT) bolus via infusion    PRN Kathleene Hazel, MD   1,815 mcg at 09/20/15 [phone]  . tirofiban (AGGRASTAT) infusion 50 mcg/mL 100 mL    Continuous PRN Kathleene Hazel, MD 13.1 mL/hr at 09/20/15 0651 0.15 mcg/kg/min at 09/20/15 0651    BP (!) 80/59   Pulse (!) 48   Wt 68.7 kg (151 lb 6.4 oz)   SpO2 99%   BMI 22.36 kg/m  General: NAD Neck: No JVD, no thyromegaly or thyroid nodule.  Lungs: Clear to auscultation bilaterally with normal respiratory effort. CV: Nondisplaced PMI.  Heart regular S1/S2, no S3/S4, no murmur.  No peripheral edema.  No carotid bruit.   Normal pedal pulses.  Abdomen: Soft, nontender, no hepatosplenomegaly, no distention.  Skin: Intact without lesions or rashes.  Neurologic: Alert and oriented x 3.  Psych: Normal affect. Extremities: No clubbing or cyanosis.  HEENT: Normal.   Assessment/Plan: 1. CAD: S/p anterior MI in 7/17 with DES to RCA.  Cath in 10/19 with no obstructive CAD.  No further chest pain.  - She is off Plavix now and taking only Xarelto 20 mg daily.  - She is tolerating Crestor without myalgias.  Good lipids in 11/19.      2. Chronic systolic CHF: Ischemic cardiomyopathy.  EF 25% on echo in 10/17, 30-35% on echo in 11/17, 30% on TEE in 8/18. She has extensive LAD-territory scar. 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  Echo 8/19 showed that EF remains 30% with LAD-territory scar.  She is not volume overloaded on exam or by Corevue. NYHA class II symptoms, less fatigued with sotalol, likely due to suppression of PVCs.   - Off Coreg with low BP and addition of sotalol.    - Increase losartan to 25 mg qhs.    - Continue Lasix 20 mg evergy other day.   - Continue spironolactone 25 daily.  3. Mitral regurgitation: Moderate to severe on 10/17 echo, severe on 11/17 echo.  I did a TEE in 8/18.  This showed moderate MR that appeared to be functional.  No indication for surgery or Mitraclip. Echo 8/19 showed moderate MR.  4. Atrial fibrillation: Unable to tolerate amiodarone due to GI side effects.  She is in NSR today on Xarelto 20 mg daily and sotalol. QTc interval is acceptable on sotalol.   5. PVCs: Very frequent on 12/18 holter (41% beats), she has had a PVC ablation in 2/19. Due to recurrence of PVCs post-ablation, sotalol was increased to 160 mg bid. She does not seem to have as many PVCs now and overall feels considerably better.  - Continue sotalol, QTc ok on  ECG today.  6. Hyperlipidemia: Tolerating Crestor, good lipids recently.    Followup in 3 months.   Marca Ancona 01/11/2018

## 2018-01-11 NOTE — Patient Instructions (Signed)
Your physician recommends that you schedule a follow-up appointment in: 3 months with Dr McLean  

## 2018-02-09 ENCOUNTER — Ambulatory Visit (INDEPENDENT_AMBULATORY_CARE_PROVIDER_SITE_OTHER): Payer: BLUE CROSS/BLUE SHIELD | Admitting: Cardiology

## 2018-02-09 ENCOUNTER — Encounter: Payer: Self-pay | Admitting: Cardiology

## 2018-02-09 VITALS — BP 108/58 | HR 71 | Ht 69.0 in | Wt 155.0 lb

## 2018-02-09 DIAGNOSIS — I255 Ischemic cardiomyopathy: Secondary | ICD-10-CM

## 2018-02-09 DIAGNOSIS — I5022 Chronic systolic (congestive) heart failure: Secondary | ICD-10-CM

## 2018-02-09 DIAGNOSIS — I493 Ventricular premature depolarization: Secondary | ICD-10-CM

## 2018-02-09 DIAGNOSIS — I2510 Atherosclerotic heart disease of native coronary artery without angina pectoris: Secondary | ICD-10-CM

## 2018-02-09 DIAGNOSIS — I340 Nonrheumatic mitral (valve) insufficiency: Secondary | ICD-10-CM

## 2018-02-09 NOTE — Progress Notes (Signed)
Electrophysiology Office Note   Date:  02/09/2018   ID:  Catherine Mays, DOB 03/17/1965, MRN [id number]  PCP:  Benita Stabile, MD  Cardiologist:  Janett Labella Primary Electrophysiologist:  Regan Lemming, MD    No chief complaint on file.    History of Present Illness: Catherine Mays is a 52 y.o. female who presents today for electrophysiology evaluation.   History of CAD (STEMI 08/2015 s/p PTCA/DES to mid LAD, mild residual mRCA), ischemic cardiomyopathy, chronic systolic CHF, paroxysmal atrial fib (dx at time of STEMI), PVCs, hypotension. She had presented to the [facility] ED with chest pain that night and was sent home. She came back in later that night with continued chest pain and EKG showed ST elevation c/w an anterior MI. Emergent transport to [facility] and cardiac cath with occluded mid LAD treated with DES x 1 on 09/20/15. She was discharged with a LifeVest for LV dysfunction, amiodarone for her atrial fib, and triple blood thinner therapy for both her MI and afib. ASA stopped after one month. Coreg cut back due to fatigue. Aldactone stopped due to worsened kidney function. She was taken off amiodarone due to poor appetite, 20 lb weight loss, nausea, dyspnea, weakness, fatigue and cough. ICD implanted 01/23/16.  She was noted to have a high burden of PVCs with a Holter monitor showing a burden of 41%. Had PVC ablation attempt which resulted in no PVCs during immediately post ablation but return of PVCs the next morning.  PVCs were coming from the posterior medial papillary muscle.  She was hospitalized at the end of October 2019 with chest pain and weak legs.  Left heart catheterization showed no major abnormality.  Medications were adjusted and she was discharged home.  Today, denies symptoms of palpitations, chest pain, shortness of breath, orthopnea, PND, lower extremity edema, claudication, dizziness, presyncope, syncope, bleeding, or neurologic sequela. The patient is  tolerating medications without difficulties.  Overall she is feeling well.  She has no major complaints today.  Past Medical History:  Diagnosis Date  . AICD (automatic cardioverter/defibrillator) present    St. Jude  . CAD in native artery    a. late recognition of presentation of STEMI 08/2015 s/p DES to LAD, mild residual mRCA.  Marland Kitchen Chronic systolic CHF (congestive heart failure) (HCC)   . CKD (chronic kidney disease), stage III (HCC)   . Hypotension    a. preventing med titration for HF.  Marland Kitchen Hypothyroidism 09/24/2015  . Ischemic cardiomyopathy   . Myocardial infarction (HCC) 08/2015  . PAF (paroxysmal atrial fibrillation) (HCC) 09/24/2015   a. dx at time of STEMI 08/2015.  . Pre-diabetes   . Presence of permanent cardiac pacemaker   . PVC's (premature ventricular contractions)    Past Surgical History:  Procedure Laterality Date  . CARDIAC CATHETERIZATION N/A 09/20/2015   Procedure: Left Heart Cath and Coronary Angiography;  Surgeon: Kathleene Hazel, MD;  Location: [facility] INVASIVE CV LAB;  Service: Cardiovascular;  Laterality: N/A;  . CARDIAC CATHETERIZATION N/A 09/20/2015   Procedure: Coronary Stent Intervention;  Surgeon: Kathleene Hazel, MD;  Location: [facility] INVASIVE CV LAB;  Service: Cardiovascular;  Laterality: N/A;  . CARDIAC CATHETERIZATION N/A 09/21/2015   Procedure: Left Heart Cath and Coronary Angiography;  Surgeon: Tonny Bollman, MD;  Location[facility] INVASIVE CV LAB;  Service: Cardiovascular;  Laterality: N/A;  . CARDIAC CATHETERIZATION N/A 11/26/2015   Procedure: Right Heart Cath;  Surgeon: [name], MD;  Location: [facility] INVASIVE CV LAB;  Service: Cardiovascular;  Laterality: N/A;  . COLONOSCOPY WITH PROPOFOL N/A 11/23/2017   Procedure: COLONOSCOPY WITH PROPOFOL;  Surgeon: Corbin Ade, MD;  Location: [facility] ENDO SUITE;  Service: Endoscopy;  Laterality: N/A;  8:45am  . CORONARY STENT PLACEMENT  09/20/2015  . EP IMPLANTABLE DEVICE N/A 01/23/2016   Procedure: ICD  Implant;  Surgeon: Amahia Madonia Jorja Loa, MD;  Location: [facility] INVASIVE CV LAB;  Service: Cardiovascular;  Laterality: N/A;  . LEFT HEART CATH AND CORONARY ANGIOGRAPHY N/A 12/18/2017   Procedure: LEFT HEART CATH AND CORONARY ANGIOGRAPHY;  Surgeon: [name], MD;  Location: [facility] INVASIVE CV LAB;  Service: Cardiovascular;  Laterality: N/A;  . PVC ABLATION N/A 04/02/2017   Procedure: PVC ABLATION;  Surgeon: Regan Lemming, MD;  Location: [facility] INVASIVE CV LAB;  Service: Cardiovascular;  Laterality: N/A;  . TEE WITHOUT CARDIOVERSION N/A 10/08/2016   Procedure: TRANSESOPHAGEAL ECHOCARDIOGRAM (TEE);  Surgeon: [name], MD;  Location: [facility] ENDOSCOPY;  Service: Cardiovascular;  Laterality: N/A;  . THYROIDECTOMY       Current Outpatient Medications  Medication Sig Dispense Refill  . Calcium Carb-Cholecalciferol (CALCIUM + D3 PO) Take 1 tablet by mouth every morning.    . Coenzyme Q10 (COQ10) 200 MG CAPS Take 200 mg by mouth daily.    . fexofenadine (ALLEGRA) 180 MG tablet Take 180 mg by mouth daily.    . furosemide (LASIX) 20 MG tablet Take 1 tablet (20 mg total) by mouth every other day.    . losartan (COZAAR) 25 MG tablet Take 1 tablet (25 mg total) by mouth at bedtime. 90 tablet 3  . nitroGLYCERIN (NITROSTAT) 0.4 MG SL tablet Place 1 tablet (0.4 mg total) under the tongue every 5 (five) minutes as needed for chest pain. 25 tablet 3  . pantoprazole (PROTONIX) 40 MG tablet TAKE 1 TABLET BY MOUTH EVERY DAY 90 tablet 3  . potassium chloride SA (K-DUR,KLOR-CON) 20 MEQ tablet Take 2 tablets (40 mEq total) by mouth every other day.    . rosuvastatin (CRESTOR) 10 MG tablet Take 1 tablet (10 mg total) by mouth daily. 90 tablet 3  . sotalol (BETAPACE) 160 MG tablet TAKE 1 TABLET BY MOUTH TWICE DAILY (Patient taking differently: Take 160 mg by mouth 2 (two) times daily. ) 60 tablet 10  . spironolactone (ALDACTONE) 25 MG tablet Take 1 tablet (25 mg total) by mouth daily. 30 tablet 3  . SYNTHROID 125 MCG  tablet Take 125 mcg by mouth every morning.   5  . traZODone (DESYREL) 50 MG tablet Take 0.5 tablets (25 mg total) by mouth at bedtime as needed for sleep. 30 tablet 0  . triamcinolone (NASACORT ALLERGY 24HR) 55 MCG/ACT AERO nasal inhaler Place 2 sprays into the nose daily.     Carlena Hurl 20 MG TABS tablet TAKE 1 TABLET EVERY DAY WITH SUPPER (Patient taking differently: Take 20 mg by mouth daily with supper. ) 90 tablet 2   No current facility-administered medications for this visit.    Facility-Administered Medications Ordered in Other Visits  Medication Dose Route Frequency Provider Last Rate Last Dose  . bivalirudin (ANGIOMAX) 250 mg in sodium chloride 0.9 % 50 mL (5 mg/mL) infusion    Continuous PRN Kathleene Hazel, MD   Stopped at 09/20/15 [phone]  . bivalirudin (ANGIOMAX) BOLUS via infusion    PRN Kathleene Hazel, MD   54.45 mg at 09/20/15 9604  . fentaNYL (SUBLIMAZE) injection    PRN Kathleene Hazel, MD   25 mcg at 09/20/15 [phone]  .  heparin 1,500 mL    PRN Verne Carrow D, MD      . heparin infusion 2 units/mL in 0.9 % sodium chloride    Continuous PRN Kathleene Hazel, MD   1,500 mL at 09/20/15 0733  . iopamidol (ISOVUE-370) 76 % injection    PRN Kathleene Hazel, MD   165 mL at 09/20/15 0733  . lidocaine (PF) (XYLOCAINE) 1 % injection    PRN Kathleene Hazel, MD   2 mL at 09/20/15 1610  . midazolam (VERSED) injection    PRN Kathleene Hazel, MD   1 mg at 09/20/15 0651  . nitroGLYCERIN 1 mg/10 ml (100 mcg/ml) - IR/CATH LAB    PRN Kathleene Hazel, MD   200 mcg at 09/20/15 9604  . Radial Cocktail/Verapamil only    PRN Kathleene Hazel, MD   10 mL at 09/20/15 0629  . ticagrelor (BRILINTA) tablet    PRN Kathleene Hazel, MD   180 mg at 09/20/15 [phone]  . tirofiban (AGGRASTAT) bolus via infusion    PRN Kathleene Hazel, MD   1,815 mcg at 09/20/15 [phone]  . tirofiban (AGGRASTAT) infusion 50 mcg/mL 100 mL     Continuous PRN Kathleene Hazel, MD 13.1 mL/hr at 09/20/15 0651 0.15 mcg/kg/min at 09/20/15 1478    Allergies:   Paxil [paroxetine]; Morphine and related; Percocet [oxycodone-acetaminophen]; Cefdinir; and Zolpidem   Social History:  The patient  reports that she has never smoked. She has never used smokeless tobacco. She reports that she does not drink alcohol or use drugs.   Family History:  The patient's family history includes Arrhythmia in her mother; Heart attack in her father.   ROS:  Please see the history of present illness.   Otherwise, review of systems is positive for none.   All other systems are reviewed and negative.   PHYSICAL EXAM: VS:  BP (!) 108/58   Pulse 71   Ht 5\' 9"  (1.753 m)   Wt 155 lb (70.3 kg)   BMI 22.89 kg/m  , BMI Body mass index is 22.89 kg/m. GEN: Well nourished, well developed, in no acute distress  HEENT: normal  Neck: no JVD, carotid bruits, or masses Cardiac: RRR; no murmurs, rubs, or gallops,no edema  Respiratory:  clear to auscultation bilaterally, normal work of breathing GI: soft, nontender, nondistended, + BS MS: no deformity or atrophy  Skin: warm and dry, device site well healed Neuro:  Strength and sensation are intact Psych: euthymic mood, full affect  EKG:  EKG is ordered today. Personal review of the ekg ordered shows SR, PVCs, IVCD  Personal review of the device interrogation today. Results in P[facility]art   Recent Labs: 12/17/2017: ALT 15; TSH 0.231 12/18/2017: BUN 24; Creatinine, Ser 1.20; Hemoglobin 11.8; Platelets 140; Potassium 4.2; Sodium 138    Lipid Panel     Component Value Date/Time   CHOL 129 12/18/2017 0509   TRIG 46 12/18/2017 0509   HDL 50 12/18/2017 0509   CHOLHDL 2.6 12/18/2017 0509   VLDL 9 12/18/2017 0509   LDLCALC 70 12/18/2017 0509     Wt Readings from Last 3 Encounters:  02/09/18 155 lb (70.3 kg)  01/11/18 151 lb 6.4 oz (68.7 kg)  12/18/17 150 lb 12.7 oz (68.4 kg)      Other studies  Reviewed: Additional studies/ records that were reviewed today include: TEE 10/05/17 Review of the above records today demonstrates:  - Left ventricle: The cavity size was mildly dilated. Wall  thickness was normal. The estimated ejection fraction was 30%.   Akinesis of the inferoseptum and anteroseptum. Apical inferior   and apical anterior akinesis. Akinesis of the apex. Features are   consistent with a pseudonormal left ventricular filling pattern,   with concomitant abnormal relaxation and increased filling   pressure (grade 2 diastolic dysfunction). - Aortic valve: There was no stenosis. - Mitral valve: There was moderate regurgitation. - Left atrium: The atrium was moderately dilated. - Right ventricle: The cavity size was normal. Pacer wire or   catheter noted in right ventricle. Systolic function was normal. - Tricuspid valve: Peak RV-RA gradient (S): 34 mm Hg. - Pulmonary arteries: PA peak pressure: 37 mm Hg (S). - Inferior vena cava: The vessel was normal in size. The   respirophasic diameter changes were in the normal range (>= 50%),   consistent with normal central venous pressure.  Holter 01/26/17 - personally reviewed Minimum HR: 58 BPM at 4:28:50 AM(2) Maximum HR: 113 BPM at 10:19:38 AM Average HR: 86 BPM Ventricular Beats: 100073 (41%) Sinus rhythm with PVCs and atrial runs  LHC 12/18/17 No obstructive coronary disease, patent LAD stent.   ASSESSMENT AND PLAN:  1.  Coronary artery disease: No chest pain.  Continue with current management.  2. Paroxysmal atrial fibrillation: None seen since hospitalization.  No changes.  This patients CHA2DS2-VASc Score and unadjusted Ischemic Stroke Rate (% per year) is equal to 3.2 % stroke rate/year from a score of 3  Above score calculated as 1 point each if present [CHF, HTN, DM, Vascular=MI/PAD/Aortic Plaque, Age if 65-74, or Female] Above score calculated as 2 points each if present [Age > 75, or Stroke/TIA/TE]  3.  Ischemic cardiomyopathy: Status post [facility] Jude ICD.  Device functioning appropriately.  Currently on optimal medical therapy per heart failure team.  4.  Moderate mitral regurgitation, nonrheumatic: Also from ischemic cardiomyopathy.  5.  PVCs: Holter monitor showed 41% PVCs.  Had ablation of the posterior medial papillary muscle with a decrease in PVC burden.  Her sotalol was increased to 60 mg twice a day which greatly improved her PVC burden.  No changes at this time.  Current medicines are reviewed at length with the patient today.   The patient does not have concerns regarding her medicines.  The following changes were made today: None  Labs/ tests ordered today include:  Orders Placed This Encounter  Procedures  . EKG 12-Lead     Disposition:   FU with Lilac Hoff 12 months  Signed, Chakia Counts Jorja Loa, MD  02/09/2018 2:11 PM     [facility] [address] [place] [address] [phone] (office) [phone] (fax)

## 2018-02-09 NOTE — Patient Instructions (Signed)
Medication Instructions:  Your physician recommends that you continue on your current medications as directed. Please refer to the Current Medication list given to you today.  * If you need a refill on your cardiac medications before your next appointment, please call your pharmacy.   Labwork: None ordered  Testing/Procedures: None ordered  Follow-Up: Your physician wants you to follow-up in: 1 year with Dr. Camnitz.  You will receive a reminder letter in the mail two months in advance. If you don't receive a letter, please call our office to schedule the follow-up appointment.  Thank you for choosing [facility]!!   Leean Amezcua, RN [phone]        

## 2018-02-10 ENCOUNTER — Other Ambulatory Visit (HOSPITAL_COMMUNITY): Payer: Self-pay | Admitting: *Deleted

## 2018-02-10 MED ORDER — FUROSEMIDE 20 MG PO TABS: 20.0000 mg | ORAL_TABLET | ORAL | 3 refills | Status: DC

## 2018-02-12 ENCOUNTER — Other Ambulatory Visit: Payer: Self-pay | Admitting: Cardiology

## 2018-02-23 LAB — CUP PACEART REMOTE DEVICE CHECK
Battery Remaining Longevity: 86 mo
Battery Remaining Percentage: 82 %
Battery Voltage: 2.99 V
Brady Statistic RV Percent Paced: 1 %
Date Time Interrogation Session: 20191029060022
HighPow Impedance: 73 Ohm
HighPow Impedance: 73 Ohm
Implantable Lead Implant Date: 20171129
Implantable Lead Location: 753860
Implantable Pulse Generator Implant Date: 20171129
Lead Channel Impedance Value: 410 Ohm
Lead Channel Pacing Threshold Amplitude: 0.75 V
Lead Channel Pacing Threshold Pulse Width: 0.5 ms
Lead Channel Sensing Intrinsic Amplitude: 12 mV
Lead Channel Setting Pacing Amplitude: 2.5 V
Lead Channel Setting Pacing Pulse Width: 0.5 ms
Lead Channel Setting Sensing Sensitivity: 0.5 mV
Pulse Gen Serial Number: 7377983

## 2018-02-25 ENCOUNTER — Other Ambulatory Visit (HOSPITAL_COMMUNITY): Payer: Self-pay

## 2018-02-25 MED ORDER — SPIRONOLACTONE 25 MG PO TABS: 25.0000 mg | ORAL_TABLET | Freq: Every day | ORAL | 3 refills | Status: DC

## 2018-03-23 ENCOUNTER — Ambulatory Visit (INDEPENDENT_AMBULATORY_CARE_PROVIDER_SITE_OTHER): Payer: BLUE CROSS/BLUE SHIELD

## 2018-03-23 DIAGNOSIS — I5022 Chronic systolic (congestive) heart failure: Secondary | ICD-10-CM

## 2018-03-23 DIAGNOSIS — I255 Ischemic cardiomyopathy: Secondary | ICD-10-CM

## 2018-03-24 NOTE — Progress Notes (Signed)
Remote ICD transmission.   

## 2018-03-25 LAB — CUP PACEART REMOTE DEVICE CHECK
Battery Remaining Longevity: 83 mo
Battery Remaining Percentage: 79 %
Battery Voltage: 2.99 V
Brady Statistic RV Percent Paced: 1 %
Date Time Interrogation Session: 20200128085806
HighPow Impedance: 71 Ohm
HighPow Impedance: 71 Ohm
Implantable Lead Implant Date: 20171129
Implantable Lead Location: 753860
Implantable Pulse Generator Implant Date: 20171129
Lead Channel Impedance Value: 410 Ohm
Lead Channel Pacing Threshold Amplitude: 0.75 V
Lead Channel Pacing Threshold Pulse Width: 0.5 ms
Lead Channel Sensing Intrinsic Amplitude: 12 mV
Lead Channel Setting Pacing Amplitude: 2.5 V
Lead Channel Setting Pacing Pulse Width: 0.5 ms
Lead Channel Setting Sensing Sensitivity: 0.5 mV
Pulse Gen Serial Number: 7377983

## 2018-04-15 ENCOUNTER — Ambulatory Visit (HOSPITAL_COMMUNITY)
Admission: RE | Admit: 2018-04-15 | Discharge: 2018-04-15 | Disposition: A | Discharge status: Home / Self Care | Payer: BLUE CROSS/BLUE SHIELD | Source: Ambulatory Visit | Attending: Cardiology | Admitting: Cardiology

## 2018-04-15 ENCOUNTER — Encounter (HOSPITAL_COMMUNITY): Payer: Self-pay | Admitting: Cardiology

## 2018-04-15 VITALS — BP 100/68 | HR 56 | Wt 156.8 lb

## 2018-04-15 DIAGNOSIS — Z7901 Long term (current) use of anticoagulants: Secondary | ICD-10-CM | POA: Diagnosis not present

## 2018-04-15 DIAGNOSIS — I2510 Atherosclerotic heart disease of native coronary artery without angina pectoris: Secondary | ICD-10-CM | POA: Diagnosis not present

## 2018-04-15 DIAGNOSIS — I340 Nonrheumatic mitral (valve) insufficiency: Secondary | ICD-10-CM | POA: Insufficient documentation

## 2018-04-15 DIAGNOSIS — Z79890 Hormone replacement therapy (postmenopausal): Secondary | ICD-10-CM | POA: Diagnosis not present

## 2018-04-15 DIAGNOSIS — Z955 Presence of coronary angioplasty implant and graft: Secondary | ICD-10-CM | POA: Insufficient documentation

## 2018-04-15 DIAGNOSIS — I5022 Chronic systolic (congestive) heart failure: Secondary | ICD-10-CM

## 2018-04-15 DIAGNOSIS — I252 Old myocardial infarction: Secondary | ICD-10-CM | POA: Insufficient documentation

## 2018-04-15 DIAGNOSIS — I480 Paroxysmal atrial fibrillation: Secondary | ICD-10-CM | POA: Diagnosis not present

## 2018-04-15 DIAGNOSIS — I255 Ischemic cardiomyopathy: Secondary | ICD-10-CM | POA: Insufficient documentation

## 2018-04-15 DIAGNOSIS — Z95810 Presence of automatic (implantable) cardiac defibrillator: Secondary | ICD-10-CM | POA: Insufficient documentation

## 2018-04-15 DIAGNOSIS — I509 Heart failure, unspecified: Secondary | ICD-10-CM

## 2018-04-15 DIAGNOSIS — I493 Ventricular premature depolarization: Secondary | ICD-10-CM

## 2018-04-15 DIAGNOSIS — Z8249 Family history of ischemic heart disease and other diseases of the circulatory system: Secondary | ICD-10-CM | POA: Diagnosis not present

## 2018-04-15 DIAGNOSIS — E785 Hyperlipidemia, unspecified: Secondary | ICD-10-CM | POA: Insufficient documentation

## 2018-04-15 DIAGNOSIS — Z79899 Other long term (current) drug therapy: Secondary | ICD-10-CM | POA: Diagnosis not present

## 2018-04-15 LAB — CBC
HCT: 37.4 % (ref 36.0–46.0)
Hemoglobin: 11.6 g/dL — ABNORMAL LOW (ref 12.0–15.0)
MCH: 28.9 pg (ref 26.0–34.0)
MCHC: 31 g/dL (ref 30.0–36.0)
MCV: 93.3 fL (ref 80.0–100.0)
Platelets: 154 10*3/uL (ref 150–400)
RBC: 4.01 MIL/uL (ref 3.87–5.11)
RDW: 12.6 % (ref 11.5–15.5)
WBC: 5.3 10*3/uL (ref 4.0–10.5)
nRBC: 0 % (ref 0.0–0.2)

## 2018-04-15 LAB — BASIC METABOLIC PANEL
Anion gap: 9 (ref 5–15)
BUN: 18 mg/dL (ref 6–20)
CO2: 26 mmol/L (ref 22–32)
Calcium: 8.9 mg/dL (ref 8.9–10.3)
Chloride: 103 mmol/L (ref 98–111)
Creatinine, Ser: 1.1 mg/dL — ABNORMAL HIGH (ref 0.44–1.00)
GFR calc Af Amer: >60 mL/min (ref >60)
GFR calc non Af Amer: 58 mL/min — ABNORMAL LOW (ref >60)
Glucose, Bld: 85 mg/dL (ref 70–99)
Potassium: 4.1 mmol/L (ref 3.5–5.1)
Sodium: 138 mmol/L (ref 135–145)

## 2018-04-15 MED ORDER — LOSARTAN POTASSIUM 25 MG PO TABS: ORAL_TABLET | ORAL | 3 refills | Status: DC

## 2018-04-15 NOTE — Progress Notes (Signed)
CSW asked to see patient to discuss disability options. CSW discussed at length criteria for Social Security Disability. Patient reports she has been working for the [employer] for 30+ years and has short term as well as long term disability. She states she may seek short term initially and then see how she is feeling and begin the SSD application process. Patient and husband grateful for the information and discussion. CSW offered number for future needs. Lasandra Beech, LCSW, CCSW-MCS [phone]

## 2018-04-15 NOTE — Patient Instructions (Addendum)
EKG was completed.  Labs were done today. We will call you with any ABNORMAL results. No news is good news!  Please follow up labs in 2 weeks, we will write you a prescription for this.  INCREASE Losartan taking 12.5mg  (0.5 tab) in the morning and 25mg  (1 tab) in the evening. This prescribtion has been sent to your pharmacy.   Your physician recommends that you schedule a follow-up appointment in: 3 MONTHS.

## 2018-04-15 NOTE — Progress Notes (Signed)
PCP: Dr. Margo Aye HF Cardiology: Dr. Shirlee Latch  53 y.o. with history of CAD s/p anterior STEMI with DES to LAD, ischemic cardiomyopathy, mitral regurgitation, and paroxysmal atrial fibrillation presents for followup of CHF and CAD.  She was admitted in 7/17 with anterior STEMI.  She had a late presentation to the cath lab.  Echo in 10/17 showed EF about 25% with LAD-territory wall motion abnormalities.  She had peri-MI atrial fibrillation and was started on Xarelto.  She was put on amiodarone.   After discharge, she continued to feel poorly.  She developed nausea, anorexia, and significant fatigue.  She was admitted for evaluation in 10/17 given concern for low output heart failure.  However, stopping amiodarone essentially resolved her symptoms.  She had RHC showing preserved cardiac output but the PCWP was high due to prominent v-waves, presumably from significant mitral regurgitation.  Of note, she was in atrial fibrillation for a time during this admission.   She had [employer] ICD placed.  She is back at work for the [employer].   TEE in 8/18 to evaluate the mitral valve showed EF 30%, moderate MR (probably functional).    Patient was seen by Dr. Elberta Fortis and noted to have very frequent PVCs on device interrogation.  Holter in 12/18 showed 41% PVCs.  She was started on sotalol 80 mg bid and titrated up to 120 mg bid.  Coreg was stopped with the addition of sotalol and losartan was decreased to 12.5 mg daily due to low BP.  She had PVC ablation in 2/19 but PVCs recurred. Sotalol was increased to 160 mg bid.   Echo 8/19 showed EF 30% with regional wall motion abnormalities, normal RV size and systolic function, moderate MR.   She was admitted in 10/19 with chest pain.  Cath showed patent LAD stent and no obstructive CAD.   She returns for followup of CHF and CAD.  She is stable.  Fatigues easily but able to walk on flat ground without dyspnea.  Under a lot of stress at work and very tired when  she gets home.  No orthopnea/PND.  No chest pain.  BP a little higher than usual today.  Denies lightheadedness/syncope/palpitations. .   ECG (personally reviewed): NSR, left axis deviation, IVCD 126 msec, QTc 444 msec    Labs (10/17): K 3.9, creatinine 1.24, hgb 10.3 Labs (11/17): K 4, creatinine 1.27 Labs (12/17): K 4.2, creatinine 1.12, BNP 439 Labs (2/18): K 4.7, creatinine 1.37 Labs (7/18): K 3.9, creatinine 1.14, hgb 11.7 Labs (12/18): K 4.5, creatinine 1.23 Labs (1/19): K 4.4, creatinine 1.14 Labs (2/19): LDL 97 (off atorvastatin), HDL 56 Labs (5/19): K 4.3, creatinine 1.1 Labs (10/19): K 4.2, creatinine 1.2, LDL 70, hgb 11.8 Labs (11/19): K 4.9, creatinine 1.17, hgb 11.8, LDL 68, low TSH  PMH:  1. CAD: Anterior STEMI in 7/17 with late presentation to the cath lab. She had DES to proximal LAD.  No significant disease in other vessels.  - LHC (10/19): Patent LAD stent, no obstructive disease.  2. Chronic systolic CHF: Ischemic cardiomyopathy.  - Echo 10/17 with EF 25%, akinesis of the mid-apical anteroseptal and anterior walls, apical dyskinesis, moderate to severe MR with incomplete coaptation.  - RHC (10/17): mean RA 4, PA 59/17 mean 38, mean PCWP 27 with prominent V waves suggesting significant MR, CI 3.06, PVR 2 WU.  - ACEI cough.  - Echo (11/17): EF 30-35%, normal RV size and systolic function, severe MR with restriction of posterior leaflet.   -  Echo (8/19): EF 30% with regional wall motion abnormalities, normal RV size and systolic function, moderate MR.  3. Atrial fibrillation: Paroxysmal.  Noted 7/17 admission peri-MI, also noted again 10/17 admission. She did not tolerate amiodarone due to nausea/anorexia.  4. Mitral regurgitation: Moderate to severe on 10/17 echo, incomplete coaptation.  ?Etiology, her MI (anterior) does not typically lead to ischemic MR.   - Echo in 11/17 with severe MR, posterior leaflet restricted.  - TEE (8/18): EF 30%, moderately dilated LV with  akinetic septal and anterior walls, moderate functional MR, normal RV size and systolic function.  - Echo (8/19): Moderate MR 5. Hyperlipidemia: Myalgias with atorvastatin.  6. PVCs: Holter 12/18 with 41% PVCs.  - PVC ablation in 2/19 with recurrence of PVCs.   SH: Married, [place] in [place], works for the [employer], no smoking or ETOH.   Family History  Problem Relation Age of Onset  . Heart attack Father   . Arrhythmia Mother   . Colon cancer Neg Hx   . Colon polyps Neg Hx    ROS: All systems reviewed and negative except as per HPI.   Current Outpatient Medications on File Prior to Encounter  Medication Sig Dispense Refill  . Calcium Carb-Cholecalciferol (CALCIUM + D3 PO) Take 1 tablet by mouth every morning.    . Coenzyme Q10 (COQ10) 200 MG CAPS Take 200 mg by mouth daily.    . fexofenadine (ALLEGRA) 180 MG tablet Take 180 mg by mouth daily.    . furosemide (LASIX) 20 MG tablet Take 1 tablet (20 mg total) by mouth every other day. 30 tablet 3  . nitroGLYCERIN (NITROSTAT) 0.4 MG SL tablet Place 1 tablet (0.4 mg total) under the tongue every 5 (five) minutes as needed for chest pain. 25 tablet 3  . pantoprazole (PROTONIX) 40 MG tablet TAKE 1 TABLET BY MOUTH EVERY DAY 90 tablet 3  . potassium chloride SA (K-DUR,KLOR-CON) 20 MEQ tablet Take 2 tablets (40 mEq total) by mouth every other day.    . rosuvastatin (CRESTOR) 10 MG tablet Take 1 tablet (10 mg total) by mouth daily. 90 tablet 3  . sotalol (BETAPACE) 160 MG tablet TAKE 1 TABLET BY MOUTH TWICE DAILY (Patient taking differently: Take 160 mg by mouth 2 (two) times daily. ) 60 tablet 10  . spironolactone (ALDACTONE) 25 MG tablet Take 1 tablet (25 mg total) by mouth daily. 30 tablet 3  . SYNTHROID 125 MCG tablet Take 112 mcg by mouth every morning.   5  . traZODone (DESYREL) 50 MG tablet Take 0.5 tablets (25 mg total) by mouth at bedtime as needed for sleep. 30 tablet 0  . triamcinolone (NASACORT ALLERGY 24HR) 55 MCG/ACT AERO nasal inhaler  Place 2 sprays into the nose daily.     Carlena Hurl. XARELTO 20 MG TABS tablet TAKE 1 TABLET EVERY DAY WITH SUPPER (Patient taking differently: Take 20 mg by mouth daily with supper. ) 90 tablet 2   Current Facility-Administered Medications on File Prior to Encounter  Medication Dose Route Frequency Provider Last Rate Last Dose  . bivalirudin (ANGIOMAX) 250 mg in sodium chloride 0.9 % 50 mL (5 mg/mL) infusion    Continuous PRN Kathleene HazelMcAlhany, Christopher D, MD   Stopped at 09/20/15 [id number]  . bivalirudin (ANGIOMAX) BOLUS via infusion    PRN Kathleene HazelMcAlhany, Christopher D, MD   54.45 mg at 09/20/15 [phone]  . fentaNYL (SUBLIMAZE) injection    PRN Kathleene HazelMcAlhany, Christopher D, MD   25 mcg at 09/20/15 [phone]  . heparin 1,500  mL    PRN Verne Carrow D, MD      . heparin infusion 2 units/mL in 0.9 % sodium chloride    Continuous PRN Kathleene Hazel, MD   1,500 mL at 09/20/15 0733  . iopamidol (ISOVUE-370) 76 % injection    PRN Kathleene Hazel, MD   165 mL at 09/20/15 0733  . lidocaine (PF) (XYLOCAINE) 1 % injection    PRN Kathleene Hazel, MD   2 mL at 09/20/15 2202  . midazolam (VERSED) injection    PRN Kathleene Hazel, MD   1 mg at 09/20/15 0651  . nitroGLYCERIN 1 mg/10 ml (100 mcg/ml) - IR/CATH LAB    PRN Kathleene Hazel, MD   200 mcg at 09/20/15 5427  . Radial Cocktail/Verapamil only    PRN Kathleene Hazel, MD   10 mL at 09/20/15 0629  . ticagrelor (BRILINTA) tablet    PRN Kathleene Hazel, MD   180 mg at 09/20/15 [phone]  . tirofiban (AGGRASTAT) bolus via infusion    PRN Kathleene Hazel, MD   1,815 mcg at 09/20/15 [phone]  . tirofiban (AGGRASTAT) infusion 50 mcg/mL 100 mL    Continuous PRN Kathleene Hazel, MD 13.1 mL/hr at 09/20/15 0651 0.15 mcg/kg/min at 09/20/15 0651    BP 100/68   Pulse (!) 56   Wt 71.1 kg (156 lb 12.8 oz)   SpO2 98%   BMI 23.16 kg/m  General: NAD Neck: No JVD, no thyromegaly or thyroid nodule.  Lungs: Clear to auscultation  bilaterally with normal respiratory effort. CV: Nondisplaced PMI.  Heart regular S1/S2, no S3/S4, no murmur.  No peripheral edema.  No carotid bruit.  Normal pedal pulses.  Abdomen: Soft, nontender, no hepatosplenomegaly, no distention.  Skin: Intact without lesions or rashes.  Neurologic: Alert and oriented x 3.  Psych: Normal affect. Extremities: No clubbing or cyanosis.  HEENT: Normal.   Assessment/Plan: 1. CAD: S/p anterior MI in 7/17 with DES to RCA.  Cath in 10/19 with no obstructive CAD.  No further chest pain.  - She is off Plavix now and taking only Xarelto 20 mg daily.  - She is tolerating Crestor without myalgias.  Good lipids in 11/19.      2. Chronic systolic CHF: Ischemic cardiomyopathy.  EF 25% on echo in 10/17, 30-35% on echo in 11/17, 30% on TEE in 8/18. She has extensive LAD-territory scar. 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  Echo 8/19 showed that EF remains 30% with LAD-territory scar.  She is not volume overloaded on exam. NYHA class II symptoms, still gets fatigued easily. - Off Coreg with low BP and addition of sotalol.    - Increase losartan to 12.5 mg qam/25 mg qpm.  Need slow titration given soft BP. BMET today and again in 2 wks.    - Continue Lasix 20 mg evergy other day.   - Continue spironolactone 25 daily.  3. Mitral regurgitation: Moderate to severe on 10/17 echo, severe on 11/17 echo.  I did a TEE in 8/18.  This showed moderate MR that appeared to be functional.  No indication for surgery or Mitraclip. Echo 8/19 showed moderate MR.  4. Atrial fibrillation: Unable to tolerate amiodarone due to GI side effects.  She is in NSR today on Xarelto 20 mg daily and sotalol. QTc interval is acceptable on sotalol.   - Continue Xarelto, need CBC today.  5. PVCs: Very frequent on 12/18 holter (41% beats), she has had a PVC  ablation in 2/19. Due to recurrence of PVCs post-ablation, sotalol was increased to 160 mg bid. She does not seem to have as many PVCs now and overall feels better.  -  Continue sotalol, QTc ok on ECG today.  6. Hyperlipidemia: Tolerating Crestor, good lipids recently.    Followup in 3 months.   Marca Ancona 04/15/2018

## 2018-06-12 ENCOUNTER — Other Ambulatory Visit: Payer: Self-pay | Admitting: Cardiovascular Disease

## 2018-06-12 ENCOUNTER — Other Ambulatory Visit (HOSPITAL_COMMUNITY): Payer: Self-pay | Admitting: Cardiology

## 2018-06-14 NOTE — Telephone Encounter (Signed)
SCr 1.1 on 04/15/18, age 53, weight 71kg, CrCl 35mL/min

## 2018-06-22 ENCOUNTER — Ambulatory Visit (INDEPENDENT_AMBULATORY_CARE_PROVIDER_SITE_OTHER): Payer: BLUE CROSS/BLUE SHIELD | Admitting: *Deleted

## 2018-06-22 ENCOUNTER — Other Ambulatory Visit: Payer: Self-pay

## 2018-06-22 DIAGNOSIS — I5022 Chronic systolic (congestive) heart failure: Secondary | ICD-10-CM | POA: Diagnosis not present

## 2018-06-22 DIAGNOSIS — I255 Ischemic cardiomyopathy: Secondary | ICD-10-CM

## 2018-06-22 LAB — CUP PACEART REMOTE DEVICE CHECK
Battery Remaining Longevity: 82 mo
Battery Remaining Percentage: 78 %
Battery Voltage: 2.99 V
Brady Statistic RV Percent Paced: 1 %
Date Time Interrogation Session: 20200428060028
HighPow Impedance: 66 Ohm
HighPow Impedance: 66 Ohm
Implantable Lead Implant Date: 20171129
Implantable Lead Location: 753860
Implantable Pulse Generator Implant Date: 20171129
Lead Channel Impedance Value: 390 Ohm
Lead Channel Pacing Threshold Amplitude: 0.75 V
Lead Channel Pacing Threshold Pulse Width: 0.5 ms
Lead Channel Sensing Intrinsic Amplitude: 12 mV
Lead Channel Setting Pacing Amplitude: 2.5 V
Lead Channel Setting Pacing Pulse Width: 0.5 ms
Lead Channel Setting Sensing Sensitivity: 0.5 mV
Pulse Gen Serial Number: 7377983

## 2018-06-30 NOTE — Progress Notes (Signed)
Remote ICD transmission.   

## 2018-07-15 ENCOUNTER — Encounter (HOSPITAL_COMMUNITY): Payer: Self-pay

## 2018-07-15 ENCOUNTER — Other Ambulatory Visit: Payer: Self-pay

## 2018-07-15 ENCOUNTER — Ambulatory Visit (HOSPITAL_COMMUNITY)
Admission: RE | Admit: 2018-07-15 | Discharge: 2018-07-15 | Disposition: A | Discharge status: Home / Self Care | Payer: BLUE CROSS/BLUE SHIELD | Source: Ambulatory Visit | Attending: Cardiology | Admitting: Cardiology

## 2018-07-15 ENCOUNTER — Encounter (HOSPITAL_COMMUNITY): Payer: BLUE CROSS/BLUE SHIELD | Admitting: Cardiology

## 2018-07-15 VITALS — Wt 150.6 lb

## 2018-07-15 DIAGNOSIS — I5022 Chronic systolic (congestive) heart failure: Secondary | ICD-10-CM | POA: Diagnosis not present

## 2018-07-15 DIAGNOSIS — I255 Ischemic cardiomyopathy: Secondary | ICD-10-CM

## 2018-07-15 DIAGNOSIS — E785 Hyperlipidemia, unspecified: Secondary | ICD-10-CM

## 2018-07-15 DIAGNOSIS — I2510 Atherosclerotic heart disease of native coronary artery without angina pectoris: Secondary | ICD-10-CM

## 2018-07-15 DIAGNOSIS — I493 Ventricular premature depolarization: Secondary | ICD-10-CM

## 2018-07-15 MED ORDER — POTASSIUM CHLORIDE CRYS ER 20 MEQ PO TBCR: 40.0000 meq | EXTENDED_RELEASE_TABLET | ORAL | 6 refills | Status: DC

## 2018-07-15 NOTE — Patient Instructions (Addendum)
EKG at Dr Scharlene Gloss office on May 2nd.   Follow up in 4 months with [facility].   Refill for potassium sent to [facility] pharmacy.

## 2018-07-15 NOTE — Progress Notes (Signed)
Called patient, AVS discussed.  Called Dr Marcelo Baldy office, spoke with front desk to arrange EKG for patient. They will call patient to schedule appt for EKG.  Rx faxed to them for EKG.  Verbalized understanding.  AVS sent via mychart.

## 2018-07-15 NOTE — Addendum Note (Signed)
Encounter addended by: Marisa Hua, RN on: 07/15/2018 11:48 AM  Actions taken: Order list changed, Clinical Note Signed

## 2018-07-15 NOTE — Progress Notes (Signed)
Heart Failure TeleHealth Note  Due to national recommendations of social distancing due to COVID 19, telehealth visit is felt to be most appropriate for this patient at this time.  I discussed the limitations, risks, security and privacy concerns of performing an evaluation and management service by telephone and the availability of in person appointments. I also discussed with the patient that there may be a patient responsible charge related to this service. The patient expressed understanding and agreed to proceed.   ID:  Catherine Mays, DOB 1965/05/18, MRN [id number]  Location: Home  Provider location: [address] [place] Type of Visit: Established patient   PCP:  Benita Stabile, MD  Cardiologist:  Verne Carrow, MD Primary HF: Dr Shirlee Latch  Chief Complaint: HF follow up   History of Present Illness: Catherine Mays is a 53 y.o. female with a history of CAD s/p anterior STEMI with DES to LAD, ischemic cardiomyopathy, mitral regurgitation, and paroxysmal atrial fibrillation presents for followup of CHF and CAD.  She was admitted in 7/17 with anterior STEMI.  She had a late presentation to the cath lab.  Echo in 10/17 showed EF about 25% with LAD-territory wall motion abnormalities.  She had peri-MI atrial fibrillation and was started on Xarelto.  She was put on amiodarone.   After discharge, she continued to feel poorly.  She developed nausea, anorexia, and significant fatigue.  She was admitted for evaluation in 10/17 given concern for low output heart failure.  However, stopping amiodarone essentially resolved her symptoms.  She had RHC showing preserved cardiac output but the PCWP was high due to prominent v-waves, presumably from significant mitral regurgitation.  Of note, she was in atrial fibrillation for a time during this admission.   She had St Jude ICD placed.  She is back at work for the [employer].   TEE in 8/18 to evaluate the mitral valve  showed EF 30%, moderate MR (probably functional).    Patient was seen by Dr. Elberta Fortis and noted to have very frequent PVCs on device interrogation.  Holter in 12/18 showed 41% PVCs.  She was started on sotalol 80 mg bid and titrated up to 120 mg bid.  Coreg was stopped with the addition of sotalol and losartan was decreased to 12.5 mg daily due to low BP.  She had PVC ablation in 2/19 but PVCs recurred. Sotalol was increased to 160 mg bid.   Echo 8/19 showed EF 30% with regional wall motion abnormalities, normal RV size and systolic function, moderate MR.   She was admitted in 10/19 with chest pain.  Cath showed patent LAD stent and no obstructive CAD.   Patient presents via audio conferencing for a telehealth visit today. Last visit losartan was increased. Overall doing fine. She is a little bit more dizzy at times since increasing losartan, has seemed to improve. Struggling with depression - started on Wellbutrin by PCP. No SOB, orthopnea, edema, or PND. No cough, fever, or chills. Had some palpitations about 1 month ago, none since. No CP or bleeding. Weights down 156 lbs -> 150 lbs. She has been losing weight by limiting carbs. Taking lasix every other day. Taking all medications.   Labs 06/30/18: K 4.9, creatinine 1.22, Hgb 11.6  Pt denies symptoms of cough, fevers, chills, or new SOB worrisome for COVID 19.    Echo (8/19): EF 30% with regional wall motion abnormalities, normal RV size and systolic function, moderate MR.   Past Medical History:  Diagnosis Date  . AICD (automatic cardioverter/defibrillator) present    St. Jude  . CAD in native artery    a. late recognition of presentation of STEMI 08/2015 s/p DES to LAD, mild residual mRCA.  Marland Kitchen Chronic systolic CHF (congestive heart failure) (HCC)   . CKD (chronic kidney disease), stage III (HCC)   . Hypotension    a. preventing med titration for HF.  Marland Kitchen Hypothyroidism 09/24/2015  . Ischemic cardiomyopathy   . Myocardial infarction (HCC)  08/2015  . PAF (paroxysmal atrial fibrillation) (HCC) 09/24/2015   a. dx at time of STEMI 08/2015.  . Pre-diabetes   . Presence of permanent cardiac pacemaker   . PVC's (premature ventricular contractions)    Past Surgical History:  Procedure Laterality Date  . CARDIAC CATHETERIZATION N/A 09/20/2015   Procedure: Left Heart Cath and Coronary Angiography;  Surgeon: Kathleene Hazel, MD;  Location: [facility] INVASIVE CV LAB;  Service: Cardiovascular;  Laterality: N/A;  . CARDIAC CATHETERIZATION N/A 09/20/2015   Procedure: Coronary Stent Intervention;  Surgeon: Kathleene Hazel, MD;  Location: [facility] INVASIVE CV LAB;  Service: Cardiovascular;  Laterality: N/A;  . CARDIAC CATHETERIZATION N/A 09/21/2015   Procedure: Left Heart Cath and Coronary Angiography;  Surgeon: Tonny Bollman, MD;  Location: [facility] INVASIVE CV LAB;  Service: Cardiovascular;  Laterality: N/A;  . CARDIAC CATHETERIZATION N/A 11/26/2015   Procedure: Right Heart Cath;  Surgeon: [name], MD;  Location: [facility] INVASIVE CV LAB;  Service: Cardiovascular;  Laterality: N/A;  . COLONOSCOPY WITH PROPOFOL N/A 11/23/2017   Procedure: COLONOSCOPY WITH PROPOFOL;  Surgeon: Corbin Ade, MD;  Location: [facility] ENDO SUITE;  Service: Endoscopy;  Laterality: N/A;  8:45am  . CORONARY STENT PLACEMENT  09/20/2015  . EP IMPLANTABLE DEVICE N/A 01/23/2016   Procedure: ICD Implant;  Surgeon: Will Jorja Loa, MD;  Location: [facility] INVASIVE CV LAB;  Service: Cardiovascular;  Laterality: N/A;  . LEFT HEART CATH AND CORONARY ANGIOGRAPHY N/A 12/18/2017   Procedure: LEFT HEART CATH AND CORONARY ANGIOGRAPHY;  Surgeon: [name], MD;  Location: [facility] INVASIVE CV LAB;  Service: Cardiovascular;  Laterality: N/A;  . PVC ABLATION N/A 04/02/2017   Procedure: PVC ABLATION;  Surgeon: Regan Lemming, MD;  Location: [facility] INVASIVE CV LAB;  Service: Cardiovascular;  Laterality: N/A;  . TEE WITHOUT CARDIOVERSION N/A 10/08/2016   Procedure: TRANSESOPHAGEAL ECHOCARDIOGRAM  (TEE);  Surgeon: [name], MD;  Location: [facility] ENDOSCOPY;  Service: Cardiovascular;  Laterality: N/A;  . THYROIDECTOMY       Current Outpatient Medications  Medication Sig Dispense Refill  . buPROPion (WELLBUTRIN XL) 150 MG 24 hr tablet Take 150 mg by mouth daily.    . Calcium Carb-Cholecalciferol (CALCIUM + D3 PO) Take 1 tablet by mouth every morning.    . Coenzyme Q10 (COQ10) 200 MG CAPS Take 200 mg by mouth daily.    . fexofenadine (ALLEGRA) 180 MG tablet Take 180 mg by mouth daily.    . furosemide (LASIX) 20 MG tablet Take 1 tablet (20 mg total) by mouth every other day. 30 tablet 3  . losartan (COZAAR) 25 MG tablet Take 1 tablet (25 mg total) by mouth at bedtime AND 0.5 tablets (12.5 mg total) every morning. 135 tablet 3  . pantoprazole (PROTONIX) 40 MG tablet TAKE 1 TABLET BY MOUTH EVERY DAY 90 tablet 3  . potassium chloride SA (K-DUR,KLOR-CON) 20 MEQ tablet Take 2 tablets (40 mEq total) by mouth every other day.    . rosuvastatin (CRESTOR) 10 MG tablet Take 1  tablet (10 mg total) by mouth daily. 90 tablet 3  . sotalol (BETAPACE) 160 MG tablet TAKE 1 TABLET BY MOUTH TWICE DAILY (Patient taking differently: Take 160 mg by mouth 2 (two) times daily. ) 60 tablet 10  . spironolactone (ALDACTONE) 25 MG tablet TAKE 1 TABLET(25 MG) BY MOUTH DAILY 90 tablet 3  . SYNTHROID 125 MCG tablet Take 112 mcg by mouth every morning.   5  . traZODone (DESYREL) 50 MG tablet Take 0.5 tablets (25 mg total) by mouth at bedtime as needed for sleep. 30 tablet 0  . triamcinolone (NASACORT ALLERGY 24HR) 55 MCG/ACT AERO nasal inhaler Place 2 sprays into the nose daily.     Carlena Hurl. XARELTO 20 MG TABS tablet TAKE 1 TABLET EVERY DAY WITH SUPPER 90 tablet 1  . nitroGLYCERIN (NITROSTAT) 0.4 MG SL tablet Place 1 tablet (0.4 mg total) under the tongue every 5 (five) minutes as needed for chest pain. 25 tablet 3   No current facility-administered medications for this encounter.    Facility-Administered Medications  Ordered in Other Encounters  Medication Dose Route Frequency Provider Last Rate Last Dose  . bivalirudin (ANGIOMAX) 250 mg in sodium chloride 0.9 % 50 mL (5 mg/mL) infusion    Continuous PRN Kathleene HazelMcAlhany, Christopher D, MD   Stopped at 09/20/15 [phone]  . bivalirudin (ANGIOMAX) BOLUS via infusion    PRN Kathleene HazelMcAlhany, Christopher D, MD   54.45 mg at 09/20/15 [id number]  . fentaNYL (SUBLIMAZE) injection    PRN Kathleene HazelMcAlhany, Christopher D, MD   25 mcg at 09/20/15 0651  . heparin 1,500 mL    PRN Verne CarrowMcAlhany, Christopher D, MD      . heparin infusion 2 units/mL in 0.9 % sodium chloride    Continuous PRN Kathleene HazelMcAlhany, Christopher D, MD   1,500 mL at 09/20/15 0733  . iopamidol (ISOVUE-370) 76 % injection    PRN Kathleene HazelMcAlhany, Christopher D, MD   165 mL at 09/20/15 0733  . lidocaine (PF) (XYLOCAINE) 1 % injection    PRN Kathleene HazelMcAlhany, Christopher D, MD   2 mL at 09/20/15 [id number]  . midazolam (VERSED) injection    PRN Kathleene HazelMcAlhany, Christopher D, MD   1 mg at 09/20/15 0651  . nitroGLYCERIN 1 mg/10 ml (100 mcg/ml) - IR/CATH LAB    PRN Kathleene HazelMcAlhany, Christopher D, MD   200 mcg at 09/20/15 [id number]  . Radial Cocktail/Verapamil only    PRN Kathleene HazelMcAlhany, Christopher D, MD   10 mL at 09/20/15 0629  . ticagrelor (BRILINTA) tablet    PRN Kathleene HazelMcAlhany, Christopher D, MD   180 mg at 09/20/15 [phone]  . tirofiban (AGGRASTAT) bolus via infusion    PRN Kathleene HazelMcAlhany, Christopher D, MD   1,815 mcg at 09/20/15 [phone]  . tirofiban (AGGRASTAT) infusion 50 mcg/mL 100 mL    Continuous PRN Kathleene HazelMcAlhany, Christopher D, MD 13.1 mL/hr at 09/20/15 0651 0.15 mcg/kg/min at 09/20/15 [phone]    Allergies:   Paxil [paroxetine]; Morphine and related; Percocet [oxycodone-acetaminophen]; Cefdinir; and Zolpidem   Social History:  The patient  reports that she has never smoked. She has never used smokeless tobacco. She reports that she does not drink alcohol or use drugs.   Family History:  The patient's family history includes Arrhythmia in her mother; Heart attack in her father.   ROS:  Please see the history  of present illness.   All other systems are personally reviewed and negative.    Exam:  (Video/Tele Health Call; Exam is subjective and or/visual.) General:  Speaks in full sentences. No resp  difficulty. Lungs: Normal respiratory effort with conversation.  Abdomen: No distension per patient report Extremities: Pt denies edema. Neuro: Alert & oriented x 3.   Recent Labs: 12/17/2017: ALT 15; TSH 0.231 04/15/2018: BUN 18; Creatinine, Ser 1.10; Hemoglobin 11.6; Platelets 154; Potassium 4.1; Sodium 138  Personally reviewed   Wt Readings from Last 3 Encounters:  07/15/18 68.3 kg (150 lb 9.6 oz)  04/15/18 71.1 kg (156 lb 12.8 oz)  02/09/18 70.3 kg (155 lb)      ASSESSMENT AND PLAN:  1. CAD: S/p anterior MI in 7/17 with DES to RCA.  Cath in 10/19 with no obstructive CAD.  No further chest pain.  - She is off Plavix now and taking only Xarelto 20 mg daily.   - She is tolerating Crestor without myalgias.   2. Chronic systolic CHF: Ischemic cardiomyopathy.  EF 25% on echo in 10/17, 30-35% on echo in 11/17, 30% on TEE in 8/18. She has extensive LAD-territory scar. 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  Echo 8/19 showed that EF remains 30% with LAD-territory scar.   - Volume sounds stable. NYHA class II symptoms. - Continue Lasix 20 mg evergy other day.   - Off Coreg with low BP and addition of sotalol.    - Continue losartan 12.5 mg qam/25 mg qpm.   - Continue spironolactone 25 daily.  3. Mitral regurgitation: Moderate to severe on 10/17 echo, severe on 11/17 echo.  Dr Shirlee Latch did a TEE in 8/18.  This showed moderate MR that appeared to be functional.  No indication for surgery or Mitraclip. Echo 8/19 showed moderate MR. No change.  4. Atrial fibrillation: Unable to tolerate amiodarone due to GI side effects.   - Continue Xarelto 20 mg daily and sotalol. Denies bleeding. Will arrange for her to have EKG at Dr Marcelo Baldy office. (she lives in [place]) 5. PVCs: Very frequent on 12/18 holter (41% beats), she has had a PVC  ablation in 2/19. Due to recurrence of PVCs post-ablation, sotalol was increased to 160 mg bid. She does not seem to have as many PVCs now and overall feels better.  - Continue sotalol. Denies palpitations.  6. Hyperlipidemia: Continue Crestor   COVID screen The patient does not have any symptoms that suggest any further testing/ screening at this time.  Social distancing reinforced today.  Patient Risk: After full review of this patients clinical status, I feel that they are at moderate risk for cardiac decompensation at this time.  Orders/Follow up: Set up EKG at Dr Scharlene Gloss office in [place]. Follow up in 4 months with [facility]. Refill potassium.  Today, I have spent 15 minutes with the patient with telehealth technology discussing the above issues.    Signed, Alford Highland, NP  07/15/2018 11:14 AM   [facility] [address] [facility] [place] [address] [phone] (office) [phone] (fax)

## 2018-07-26 ENCOUNTER — Other Ambulatory Visit: Payer: Self-pay | Admitting: Cardiology

## 2018-09-03 ENCOUNTER — Other Ambulatory Visit (HOSPITAL_COMMUNITY): Payer: Self-pay

## 2018-09-03 MED ORDER — ROSUVASTATIN CALCIUM 10 MG PO TABS: 10.0000 mg | ORAL_TABLET | Freq: Every day | ORAL | 3 refills | Status: DC

## 2018-09-21 ENCOUNTER — Ambulatory Visit (INDEPENDENT_AMBULATORY_CARE_PROVIDER_SITE_OTHER): Payer: BC Managed Care – PPO | Admitting: *Deleted

## 2018-09-21 DIAGNOSIS — I255 Ischemic cardiomyopathy: Secondary | ICD-10-CM

## 2018-09-22 ENCOUNTER — Telehealth: Payer: Self-pay

## 2018-09-22 NOTE — Telephone Encounter (Signed)
Left message for patient to remind of missed remote transmission.  

## 2018-09-27 ENCOUNTER — Other Ambulatory Visit (HOSPITAL_COMMUNITY): Payer: Self-pay | Admitting: Cardiology

## 2018-09-28 ENCOUNTER — Other Ambulatory Visit (HOSPITAL_COMMUNITY): Payer: Self-pay

## 2018-09-28 MED ORDER — LOSARTAN POTASSIUM 25 MG PO TABS: ORAL_TABLET | ORAL | 3 refills | Status: DC

## 2018-10-04 ENCOUNTER — Encounter: Payer: Self-pay | Admitting: Cardiology

## 2018-10-04 LAB — CUP PACEART REMOTE DEVICE CHECK
Battery Remaining Longevity: 79 mo
Battery Remaining Percentage: 76 %
Battery Voltage: 2.98 V
Brady Statistic RV Percent Paced: 1 %
Date Time Interrogation Session: 20200728070202
HighPow Impedance: 66 Ohm
HighPow Impedance: 66 Ohm
Implantable Lead Implant Date: 20171129
Implantable Lead Location: 753860
Implantable Pulse Generator Implant Date: 20171129
Lead Channel Impedance Value: 340 Ohm
Lead Channel Pacing Threshold Amplitude: 0.75 V
Lead Channel Pacing Threshold Pulse Width: 0.5 ms
Lead Channel Sensing Intrinsic Amplitude: 12 mV
Lead Channel Setting Pacing Amplitude: 2.5 V
Lead Channel Setting Pacing Pulse Width: 0.5 ms
Lead Channel Setting Sensing Sensitivity: 0.5 mV
Pulse Gen Serial Number: 7377983

## 2018-10-04 NOTE — Progress Notes (Signed)
Remote ICD transmission.   

## 2018-10-05 ENCOUNTER — Other Ambulatory Visit: Payer: Self-pay | Admitting: Cardiology

## 2018-10-05 MED ORDER — SOTALOL HCL 160 MG PO TABS: 160.0000 mg | ORAL_TABLET | Freq: Two times a day (BID) | ORAL | 0 refills | Status: DC

## 2018-10-05 NOTE — Telephone Encounter (Signed)
Pt called and stated that she was out of town and needed a 10 day supply of Sotalol sent to local pharmacy. Sent a 10 day supply to local pharmacy where pt requested. Confirmation received.

## 2018-11-16 ENCOUNTER — Ambulatory Visit (HOSPITAL_COMMUNITY)
Admission: RE | Admit: 2018-11-16 | Discharge: 2018-11-16 | Disposition: A | Discharge status: Home / Self Care | Payer: BC Managed Care – PPO | Source: Ambulatory Visit | Attending: Cardiology | Admitting: Cardiology

## 2018-11-16 ENCOUNTER — Other Ambulatory Visit: Payer: Self-pay

## 2018-11-16 ENCOUNTER — Encounter (HOSPITAL_COMMUNITY): Payer: Self-pay | Admitting: Cardiology

## 2018-11-16 VITALS — BP 88/60 | HR 53 | Wt 148.8 lb

## 2018-11-16 DIAGNOSIS — E785 Hyperlipidemia, unspecified: Secondary | ICD-10-CM | POA: Diagnosis not present

## 2018-11-16 DIAGNOSIS — I493 Ventricular premature depolarization: Secondary | ICD-10-CM | POA: Insufficient documentation

## 2018-11-16 DIAGNOSIS — I255 Ischemic cardiomyopathy: Secondary | ICD-10-CM | POA: Diagnosis not present

## 2018-11-16 DIAGNOSIS — Z8249 Family history of ischemic heart disease and other diseases of the circulatory system: Secondary | ICD-10-CM | POA: Insufficient documentation

## 2018-11-16 DIAGNOSIS — I252 Old myocardial infarction: Secondary | ICD-10-CM | POA: Diagnosis not present

## 2018-11-16 DIAGNOSIS — Z79890 Hormone replacement therapy (postmenopausal): Secondary | ICD-10-CM | POA: Diagnosis not present

## 2018-11-16 DIAGNOSIS — Z79899 Other long term (current) drug therapy: Secondary | ICD-10-CM | POA: Insufficient documentation

## 2018-11-16 DIAGNOSIS — Z955 Presence of coronary angioplasty implant and graft: Secondary | ICD-10-CM | POA: Insufficient documentation

## 2018-11-16 DIAGNOSIS — I340 Nonrheumatic mitral (valve) insufficiency: Secondary | ICD-10-CM | POA: Diagnosis not present

## 2018-11-16 DIAGNOSIS — I2510 Atherosclerotic heart disease of native coronary artery without angina pectoris: Secondary | ICD-10-CM | POA: Diagnosis not present

## 2018-11-16 DIAGNOSIS — Z7901 Long term (current) use of anticoagulants: Secondary | ICD-10-CM | POA: Insufficient documentation

## 2018-11-16 DIAGNOSIS — I480 Paroxysmal atrial fibrillation: Secondary | ICD-10-CM | POA: Diagnosis not present

## 2018-11-16 DIAGNOSIS — I5022 Chronic systolic (congestive) heart failure: Secondary | ICD-10-CM | POA: Insufficient documentation

## 2018-11-16 LAB — CBC
HCT: 31.7 % — ABNORMAL LOW (ref 36.0–46.0)
Hemoglobin: 10.1 g/dL — ABNORMAL LOW (ref 12.0–15.0)
MCH: 29.2 pg (ref 26.0–34.0)
MCHC: 31.9 g/dL (ref 30.0–36.0)
MCV: 91.6 fL (ref 80.0–100.0)
Platelets: 195 10*3/uL (ref 150–400)
RBC: 3.46 MIL/uL — ABNORMAL LOW (ref 3.87–5.11)
RDW: 13.2 % (ref 11.5–15.5)
WBC: 5 10*3/uL (ref 4.0–10.5)
nRBC: 0 % (ref 0.0–0.2)

## 2018-11-16 LAB — BASIC METABOLIC PANEL
Anion gap: 7 (ref 5–15)
BUN: 19 mg/dL (ref 6–20)
CO2: 27 mmol/L (ref 22–32)
Calcium: 8.7 mg/dL — ABNORMAL LOW (ref 8.9–10.3)
Chloride: 104 mmol/L (ref 98–111)
Creatinine, Ser: 1.34 mg/dL — ABNORMAL HIGH (ref 0.44–1.00)
GFR calc Af Amer: 53 mL/min — ABNORMAL LOW (ref >60)
GFR calc non Af Amer: 45 mL/min — ABNORMAL LOW (ref >60)
Glucose, Bld: 99 mg/dL (ref 70–99)
Potassium: 4.4 mmol/L (ref 3.5–5.1)
Sodium: 138 mmol/L (ref 135–145)

## 2018-11-16 LAB — LIPID PANEL
Cholesterol: 126 mg/dL (ref 0–200)
HDL: 57 mg/dL (ref >40)
LDL Cholesterol: 52 mg/dL (ref 0–99)
Total CHOL/HDL Ratio: 2.2 RATIO
Triglycerides: 83 mg/dL (ref <150)
VLDL: 17 mg/dL (ref 0–40)

## 2018-11-16 NOTE — Patient Instructions (Signed)
Labs done today. We will contact you only if your labs are abnormal.  No medication changes were made. Please continue current medications as prescribed.  Your physician recommends that you schedule a follow-up appointment in: 3 months with an Echo prior to your appointment.   Your physician has requested that you have an echocardiogram. Echocardiography is a painless test that uses sound waves to create images of your heart. It provides your doctor with information about the size and shape of your heart and how well your heart's chambers and valves are working. This procedure takes approximately one hour. There are no restrictions for this procedure.  At the [facility], you and your health needs are our priority. As part of our continuing mission to provide you with exceptional heart care, we have created designated Provider Care Teams. These Care Teams include your primary Cardiologist (physician) and Advanced Practice Providers (APPs- Physician Assistants and Nurse Practitioners) who all work together to provide you with the care you need, when you need it.   You may see any of the following providers on your designated Care Team at your next follow up: Marland Kitchen Dr Glori Bickers . Dr Loralie Champagne . Darrick Grinder, NP   Please be sure to bring in all your medications bottles to every appointment.

## 2018-11-16 NOTE — Progress Notes (Signed)
PCP: Dr. Margo Aye HF Cardiology: Dr. Shirlee Latch  53 y.o. with history of CAD s/p anterior STEMI with DES to LAD, ischemic cardiomyopathy, mitral regurgitation, and paroxysmal atrial fibrillation presents for followup of CHF and CAD.  She was admitted in 7/17 with anterior STEMI.  She had a late presentation to the cath lab.  Echo in 10/17 showed EF about 25% with LAD-territory wall motion abnormalities.  She had peri-MI atrial fibrillation and was started on Xarelto.  She was put on amiodarone.   After discharge, she continued to feel poorly.  She developed nausea, anorexia, and significant fatigue.  She was admitted for evaluation in 10/17 given concern for low output heart failure.  However, stopping amiodarone essentially resolved her symptoms.  She had RHC showing preserved cardiac output but the PCWP was high due to prominent v-waves, presumably from significant mitral regurgitation.  Of note, she was in atrial fibrillation for a time during this admission.   She had St Jude ICD placed.  She is back at work for the [employer].   TEE in 8/18 to evaluate the mitral valve showed EF 30%, moderate MR (probably functional).    Patient was seen by Dr. Elberta Fortis and noted to have very frequent PVCs on device interrogation.  Holter in 12/18 showed 41% PVCs.  She was started on sotalol 80 mg bid and titrated up to 120 mg bid.  Coreg was stopped with the addition of sotalol and losartan was decreased to 12.5 mg daily due to low BP.  She had PVC ablation in 2/19 but PVCs recurred. Sotalol was increased to 160 mg bid.   Echo 8/19 showed EF 30% with regional wall motion abnormalities, normal RV size and systolic function, moderate MR.   She was admitted in 10/19 with chest pain.  Cath showed patent LAD stent and no obstructive CAD.   She returns for followup of CHF and CAD.  Weight is down 8 lbs.  She has been doing well.  Rare lightheadedness despite BP on the low side.  No palpitations now that she is  on sotalol.  She is short of breath only if she climbs a flight of stairs fast.  No dyspnea walking on flat ground.  No chest pain.  Working full time.   ECG (personally reviewed): NSR, PVC, IVCD 124 msec, QTc 435 msec   Labs (10/17): K 3.9, creatinine 1.24, hgb 10.3 Labs (11/17): K 4, creatinine 1.27 Labs (12/17): K 4.2, creatinine 1.12, BNP 439 Labs (2/18): K 4.7, creatinine 1.37 Labs (7/18): K 3.9, creatinine 1.14, hgb 11.7 Labs (12/18): K 4.5, creatinine 1.23 Labs (1/19): K 4.4, creatinine 1.14 Labs (2/19): LDL 97 (off atorvastatin), HDL 56 Labs (5/19): K 4.3, creatinine 1.1 Labs (10/19): K 4.2, creatinine 1.2, LDL 70, hgb 11.8 Labs (11/19): K 4.9, creatinine 1.17, hgb 11.8, LDL 68, low TSH  Labs (9/20): K 4.4, creatinine 1.34, LDL 52  PMH:  1. CAD: Anterior STEMI in 7/17 with late presentation to the cath lab. She had DES to proximal LAD.  No significant disease in other vessels.  - LHC (10/19): Patent LAD stent, no obstructive disease.  2. Chronic systolic CHF: Ischemic cardiomyopathy.  - Echo 10/17 with EF 25%, akinesis of the mid-apical anteroseptal and anterior walls, apical dyskinesis, moderate to severe MR with incomplete coaptation.  - RHC (10/17): mean RA 4, PA 59/17 mean 38, mean PCWP 27 with prominent V waves suggesting significant MR, CI 3.06, PVR 2 WU.  - ACEI cough.  - Echo (11/17):  EF 30-35%, normal RV size and systolic function, severe MR with restriction of posterior leaflet.   - Echo (8/19): EF 30% with regional wall motion abnormalities, normal RV size and systolic function, moderate MR.  3. Atrial fibrillation: Paroxysmal.  Noted 7/17 admission peri-MI, also noted again 10/17 admission. She did not tolerate amiodarone due to nausea/anorexia.  4. Mitral regurgitation: Moderate to severe on 10/17 echo, incomplete coaptation.  ?Etiology, her MI (anterior) does not typically lead to ischemic MR. - Echo in 11/17 with severe MR, posterior leaflet restricted.  - TEE  (8/18): EF 30%, moderately dilated LV with akinetic septal and anterior walls, moderate functional MR, normal RV size and systolic function.  - Echo (8/19): Moderate MR 5. Hyperlipidemia: Myalgias with atorvastatin.  6. PVCs: Holter 12/18 with 41% PVCs.  - PVC ablation in 2/19 with recurrence of PVCs.   SH: Married, lives in [place], works for the [employer], no smoking or ETOH.   Family History  Problem Relation Age of Onset  . Heart attack Father   . Arrhythmia Mother   . Colon cancer Neg Hx   . Colon polyps Neg Hx    ROS: All systems reviewed and negative except as per HPI.   Current Outpatient Medications on File Prior to Encounter  Medication Sig Dispense Refill  . buPROPion (WELLBUTRIN XL) 150 MG 24 hr tablet Take 150 mg by mouth daily.    . Calcium Carb-Cholecalciferol (CALCIUM + D3 PO) Take 1 tablet by mouth every morning.    . Coenzyme Q10 (COQ10) 200 MG CAPS Take 200 mg by mouth daily.    . fexofenadine (ALLEGRA) 180 MG tablet Take 180 mg by mouth daily.    . furosemide (LASIX) 20 MG tablet TAKE 1 TABLET(20 MG) BY MOUTH EVERY OTHER DAY 30 tablet 3  . losartan (COZAAR) 25 MG tablet Take 1 tablet (25 mg total) by mouth at bedtime AND 0.5 tablets (12.5 mg total) every morning. 135 tablet 3  . nitroGLYCERIN (NITROSTAT) 0.4 MG SL tablet Place 1 tablet (0.4 mg total) under the tongue every 5 (five) minutes as needed for chest pain. 25 tablet 3  . pantoprazole (PROTONIX) 40 MG tablet TAKE 1 TABLET BY MOUTH EVERY DAY 90 tablet 3  . potassium chloride SA (K-DUR) 20 MEQ tablet Take 2 tablets (40 mEq total) by mouth every other day. 30 tablet 6  . rosuvastatin (CRESTOR) 10 MG tablet Take 1 tablet (10 mg total) by mouth daily. 90 tablet 3  . sotalol (BETAPACE) 160 MG tablet Take 1 tablet (160 mg total) by mouth 2 (two) times daily. 20 tablet 0  . spironolactone (ALDACTONE) 25 MG tablet TAKE 1 TABLET(25 MG) BY MOUTH DAILY 90 tablet 3  . SYNTHROID 125 MCG tablet Take 112 mcg by mouth every  morning.   5  . traZODone (DESYREL) 50 MG tablet Take 0.5 tablets (25 mg total) by mouth at bedtime as needed for sleep. 30 tablet 0  . triamcinolone (NASACORT ALLERGY 24HR) 55 MCG/ACT AERO nasal inhaler Place 2 sprays into the nose daily.     Carlena Hurl 20 MG TABS tablet TAKE 1 TABLET EVERY DAY WITH SUPPER 90 tablet 1   Current Facility-Administered Medications on File Prior to Encounter  Medication Dose Route Frequency Provider Last Rate Last Dose  . bivalirudin (ANGIOMAX) 250 mg in sodium chloride 0.9 % 50 mL (5 mg/mL) infusion    Continuous PRN Kathleene Hazel, MD   Stopped at 09/20/15 [phone]  . bivalirudin (ANGIOMAX) BOLUS via infusion  PRN Burnell Blanks, MD   54.45 mg at 09/20/15 [phone]  . fentaNYL (SUBLIMAZE) injection    PRN Burnell Blanks, MD   25 mcg at 09/20/15 0651  . heparin 1,500 mL    PRN Lauree Chandler D, MD      . heparin infusion 2 units/mL in 0.9 % sodium chloride    Continuous PRN Burnell Blanks, MD   1,500 mL at 09/20/15 0733  . iopamidol (ISOVUE-370) 76 % injection    PRN Burnell Blanks, MD   165 mL at 09/20/15 0733  . lidocaine (PF) (XYLOCAINE) 1 % injection    PRN Burnell Blanks, MD   2 mL at 09/20/15 3734  . midazolam (VERSED) injection    PRN Burnell Blanks, MD   1 mg at 09/20/15 0651  . nitroGLYCERIN 1 mg/10 ml (100 mcg/ml) - IR/CATH LAB    PRN Burnell Blanks, MD   200 mcg at 09/20/15 2876  . Radial Cocktail/Verapamil only    PRN Burnell Blanks, MD   10 mL at 09/20/15 0629  . ticagrelor (BRILINTA) tablet    PRN Burnell Blanks, MD   180 mg at 09/20/15 [phone]  . tirofiban (AGGRASTAT) bolus via infusion    PRN Burnell Blanks, MD   1,815 mcg at 09/20/15 [phone]  . tirofiban (AGGRASTAT) infusion 50 mcg/mL 100 mL    Continuous PRN Burnell Blanks, MD 13.1 mL/hr at 09/20/15 0651 0.15 mcg/kg/min at 09/20/15 0651    BP (!) 88/60   Pulse (!) 53   Wt 67.5 kg (148 lb 12.8  oz)   SpO2 98%   BMI 21.97 kg/m  General: NAD Neck: No JVD, no thyromegaly or thyroid nodule.  Lungs: Clear to auscultation bilaterally with normal respiratory effort. CV: Nondisplaced PMI.  Heart regular S1/S2, no S3/S4, no murmur.  No peripheral edema.  No carotid bruit.  Normal pedal pulses.  Abdomen: Soft, nontender, no hepatosplenomegaly, no distention.  Skin: Intact without lesions or rashes.  Neurologic: Alert and oriented x 3.  Psych: Normal affect. Extremities: No clubbing or cyanosis.  HEENT: Normal.   Assessment/Plan: 1. CAD: S/p anterior MI in 7/17 with DES to RCA.  Cath in 10/19 with no obstructive CAD.  No further chest pain.  - She is off Plavix now and taking only Xarelto 20 mg daily.  - She is tolerating Crestor without myalgias.  Good lipids today (LDL 52).      2. Chronic systolic CHF: Ischemic cardiomyopathy.  EF 25% on echo in 10/17, 30-35% on echo in 11/17, 30% on TEE in 8/18. She has extensive LAD-territory scar. She has a [employer], [employer] ICD.  Echo 8/19 showed that EF remains 30% with LAD-territory scar.  She is not volume overloaded on exam. NYHA class II symptoms. - Off Coreg with low BP and addition of sotalol.  No BP room to add back Coreg.  - Continue losartan 12.5 mg qam/25 mg qpm, no BP room to increase.  BMET today was stable.    - Continue Lasix 20 mg evergy other day.   - Continue spironolactone 25 daily.  - I will arrange for repeat echo.  3. Mitral regurgitation: Moderate to severe on 10/17 echo, severe on 11/17 echo.  I did a TEE in 8/18.  This showed moderate MR that appeared to be functional.  No indication for surgery or Mitraclip. Echo 8/19 showed moderate MR.  - Repeat echo as above.  4. Atrial fibrillation: Unable to  tolerate amiodarone due to GI side effects.  She is in NSR today on Xarelto 20 mg daily and sotalol. QTc interval is acceptable on sotalol on today's ECG.   - Continue Xarelto.  CBC today showed a mildly decreased hgb, she should have colon  cancer screening given her age.   5. PVCs: Very frequent on 12/18 holter (41% beats), she has had a PVC ablation in 2/19. Due to recurrence of PVCs post-ablation, sotalol was increased to 160 mg bid. She does not seem to have many PVCs now and overall feels better.  - Continue sotalol, QTc ok on ECG today.  6. Hyperlipidemia: Tolerating Crestor, good lipids today.   Followup in 3 months with echo.   Marca AnconaDalton Miciah Shealy 11/16/2018

## 2018-12-03 ENCOUNTER — Other Ambulatory Visit: Payer: Self-pay | Admitting: Cardiovascular Disease

## 2018-12-15 NOTE — Progress Notes (Signed)
Chief Complaint  Patient presents with  . Follow-up    CAD    History of Present Illness: 53 yo female with history of CAD with anterior STEMI in July 2017, ischemic cardiomyopathy, chronic systolic CHF, paroxysmal atrial fib, severe mitral regurgitation here today for follow up. [facility]19/15 with chest pain and workup was delayed leading to late recognition of her MI. Cardiac cath 08/2715 with occluded mid LAD treated with a single drug eluting stent. She was found to have severe LV systolic dysfunction with LVEF around 30% immediately post cath. Continued chest pain and relook cath 09/21/15 demonstrated patency of the LAD stent. Her hospitalization was complicated by atrial fibrillation. She has been on Xarelto. She did not tolerate amiodarone. Echo November 2017 with LVEF 30-35%, severe MR. ICD implanted November 2017. She has been followed in the advanced CHF clinic by Dr. Shirlee LatchMcLean.TEE August 2018 showed LVEF=30% with moderate MR. She was found to have frequent PVCs on her device and Holter December 2018 showed 41% PVCs. She was started on Sotalol and Coreg was stopped. She had a PVC ablation in February 2019 but the PVCs returned. Echo August 2019 with LVEF=30% with normal RV size and function, moderate MR. She was seen in the Advanced Heart Failure Clinic last week by Dr. Shirlee LatchMcLean. [facility] with chest pain. Cardiac cath with patent LAD stent and no obstructive disease in the other vessels.   She is here today for follow up. The patient denies any chest pain, dyspnea, palpitations, lower extremity edema, orthopnea, PND, dizziness, near syncope or syncope. She has been camping and hiking recently and doing well.   Primary Care Physician: Benita StabileHall, John Z, MD   Past Medical History:  Diagnosis Date  . AICD (automatic cardioverter/defibrillator) present    St. Jude  . CAD in native artery    a. late recognition of presentation of STEMI  08/2015 s/p DES to LAD, mild residual mRCA.  Marland Kitchen. Chronic systolic CHF (congestive heart failure) (HCC)   . CKD (chronic kidney disease), stage III   . Hypotension    a. preventing med titration for HF.  Marland Kitchen. Hypothyroidism 09/24/2015  . Ischemic cardiomyopathy   . Myocardial infarction (HCC) 08/2015  . PAF (paroxysmal atrial fibrillation) (HCC) 09/24/2015   a. dx at time of STEMI 08/2015.  . Pre-diabetes   . Presence of permanent cardiac pacemaker   . PVC's (premature ventricular contractions)     Past Surgical History:  Procedure Laterality Date  . CARDIAC CATHETERIZATION N/A 09/20/2015   Procedure: Left Heart Cath and Coronary Angiography;  Surgeon: Kathleene Hazelhristopher D McAlhany, MD;  [facility];  Service: Cardiovascular;  Laterality: N/A;  . CARDIAC CATHETERIZATION N/A 09/20/2015   Procedure: Coronary Stent Intervention;  Surgeon: Kathleene Hazelhristopher D McAlhany, MD;  Location: [facility] INVASIVE CV LAB;  Service: Cardiovascular;  Laterality: N/A;  . CARDIAC CATHETERIZATION N/A 09/21/2015   Procedure: Left Heart Cath and Coronary Angiography;  Surgeon: Tonny BollmanMichael Cooper, MD;  [facility];  Service: Cardiovascular;  Laterality: N/A;  . CARDIAC CATHETERIZATION N/A 11/26/2015   Procedure: Right Heart Cath;  Surgeon: Laurey Moralealton S McLean, MD;  [facility];  Service: Cardiovascular;  Laterality: N/A;  . COLONOSCOPY WITH PROPOFOL N/A 11/23/2017   Procedure: COLONOSCOPY WITH PROPOFOL;  Surgeon: Corbin Adeourk, Robert M, MD;  Location: [facility] ENDO [facility];  Service: Endoscopy;  Laterality: N/A;  8:45am  . CORONARY STENT PLACEMENT  09/20/2015  .  EP IMPLANTABLE DEVICE N/A 01/23/2016   Procedure: ICD Implant;  Surgeon: Will Jorja Loa, MD;  Location: [facility] INVASIVE CV LAB;  Service: Cardiovascular;  Laterality: N/A;  . LEFT HEART CATH AND CORONARY ANGIOGRAPHY N/A 12/18/2017   Procedure: LEFT HEART CATH AND CORONARY ANGIOGRAPHY;  Surgeon: [name], MD;  Location: [facility] INVASIVE CV LAB;  Service:  Cardiovascular;  Laterality: N/A;  . PVC ABLATION N/A 04/02/2017   Procedure: PVC ABLATION;  Surgeon: Regan Lemming, MD;  Location: [facility] INVASIVE CV LAB;  Service: Cardiovascular;  Laterality: N/A;  . TEE WITHOUT CARDIOVERSION N/A 10/08/2016   Procedure: TRANSESOPHAGEAL ECHOCARDIOGRAM (TEE);  Surgeon: [name], MD;  Location: [facility] ENDOSCOPY;  Service: Cardiovascular;  Laterality: N/A;  . THYROIDECTOMY      Home Meds:  Current Meds  Medication Sig  . buPROPion (WELLBUTRIN XL) 150 MG 24 hr tablet Take 150 mg by mouth daily.  . Calcium Carb-Cholecalciferol (CALCIUM + D3 PO) Take 1 tablet by mouth every morning.  . Coenzyme Q10 (COQ10) 200 MG CAPS Take 200 mg by mouth daily.  . fexofenadine (ALLEGRA) 180 MG tablet Take 180 mg by mouth daily.  . furosemide (LASIX) 20 MG tablet TAKE 1 TABLET(20 MG) BY MOUTH EVERY OTHER DAY  . losartan (COZAAR) 25 MG tablet Take 1 tablet (25 mg total) by mouth at bedtime AND 0.5 tablets (12.5 mg total) every morning.  . nitroGLYCERIN (NITROSTAT) 0.4 MG SL tablet Place 1 tablet (0.4 mg total) under the tongue every 5 (five) minutes as needed for chest pain.  . pantoprazole (PROTONIX) 40 MG tablet Take 1 tablet (40 mg total) by mouth daily. Please keep upcoming appt with Dr. Clifton James in October before anymore refills. Thank you  . potassium chloride SA (K-DUR) 20 MEQ tablet Take 2 tablets (40 mEq total) by mouth every other day.  . rosuvastatin (CRESTOR) 10 MG tablet Take 1 tablet (10 mg total) by mouth daily.  . sotalol (BETAPACE) 160 MG tablet Take 1 tablet (160 mg total) by mouth 2 (two) times daily.  Marland Kitchen spironolactone (ALDACTONE) 25 MG tablet TAKE 1 TABLET(25 MG) BY MOUTH DAILY  . SYNTHROID 112 MCG tablet Take 112 mcg by mouth every morning.   . traZODone (DESYREL) 50 MG tablet Take 0.5 tablets (25 mg total) by mouth at bedtime as needed for sleep.  Marland Kitchen triamcinolone (NASACORT ALLERGY 24HR) 55 MCG/ACT AERO nasal inhaler Place 2 sprays into the nose daily.    Carlena Hurl 20 MG TABS tablet TAKE 1 TABLET EVERY DAY WITH SUPPER    Allergies  Allergen Reactions  . Paxil [Paroxetine] Palpitations  . Morphine And Related Nausea And Vomiting  . Percocet [Oxycodone-Acetaminophen] Nausea And Vomiting  . Cefdinir Nausea Only  . Zolpidem Other (See Comments)    Doesn't work for patient at all    Social History   Socioeconomic History  . Marital status: Married    Spouse name: Not on file  . Number of children: Not on file  . Years of education: Not on file  . Highest education level: Not on file  Occupational History  . Not on file  Social Needs  . Financial resource strain: Not on file  . Food insecurity    Worry: Not on file    Inability: Not on file  . Transportation needs    Medical: Not on file    Non-medical: Not on file  Tobacco Use  . Smoking status: Never Smoker  . Smokeless tobacco: Never Used  Substance and Sexual  Activity  . Alcohol use: No  . Drug use: No  . Sexual activity: Not on file  Lifestyle  . Physical activity    Days per week: Not on file    Minutes per session: Not on file  . Stress: Not on file  Relationships  . Social Herbalist on phone: Not on file    Gets together: Not on file    Attends religious service: Not on file    Active member of club or organization: Not on file    Attends meetings of clubs or organizations: Not on file    Relationship status: Not on file  . Intimate partner violence    Fear of current or ex partner: Not on file    Emotionally abused: Not on file    Physically abused: Not on file    Forced sexual activity: Not on file  Other Topics Concern  . Not on file  Social History Narrative  . Not on file    Family History  Problem Relation Age of Onset  . Heart attack Father   . Arrhythmia Mother   . Colon cancer Neg Hx   . Colon polyps Neg Hx     Review of Systems:  As stated in the HPI and otherwise negative.   BP (!) 88/56   Pulse (!) 56   Ht 5\' 9"   (1.753 m)   Wt 145 lb 12.8 oz (66.1 kg)   SpO2 97%   BMI 21.53 kg/m   Physical Examination:  General: Well developed, well nourished, NAD  HEENT: OP clear, mucus membranes moist  SKIN: warm, dry. No rashes. Neuro: No focal deficits  Musculoskeletal: Muscle strength 5/5 all ext  Psychiatric: Mood and affect normal  Neck: No JVD, no carotid bruits, no thyromegaly, no lymphadenopathy.  Lungs:Clear bilaterally, no wheezes, rhonci, crackles Cardiovascular: Regular rate and rhythm. No murmurs, gallops or rubs. Abdomen:Soft. Bowel sounds present. Non-tender.  Extremities: No lower extremity edema. Pulses are 2 + in the bilateral DP/PT.  Echo August 2019: Left ventricle: The cavity size was mildly dilated. Wall   thickness was normal. The estimated ejection fraction was 30%.   Akinesis of the inferoseptum and anteroseptum. Apical inferior   and apical anterior akinesis. Akinesis of the apex. Features are   consistent with a pseudonormal left ventricular filling pattern,   with concomitant abnormal relaxation and increased filling   pressure (grade 2 diastolic dysfunction). - Aortic valve: There was no stenosis. - Mitral valve: There was moderate regurgitation. - Left atrium: The atrium was moderately dilated. - Right ventricle: The cavity size was normal. Pacer wire or   catheter noted in right ventricle. Systolic function was normal. - Tricuspid valve: Peak RV-RA gradient (S): 34 mm Hg. - Pulmonary arteries: PA peak pressure: 37 mm Hg (S). - Inferior vena cava: The vessel was normal in size. The   respirophasic diameter changes were in the normal range (>= 50%),   consistent with normal central venous pressure.  Impressions:  - Mildly dilated LV with EF 30%. Wall motion abnormalities as   above. Normal RV size and systolic function. Moderate MR. Mild   pulmonary hypertension.  EKG:  EKG is not  ordered today. The ekg ordered today demonstrates   Recent Labs: 12/17/2017:  ALT 15; TSH 0.231 11/16/2018: BUN 19; Creatinine, Ser 1.34; Hemoglobin 10.1; Platelets 195; Potassium 4.4; Sodium 138   Lipid Panel    Component Value Date/Time   CHOL 126 11/16/2018 0932  TRIG 83 11/16/2018 0932   HDL 57 11/16/2018 0932   CHOLHDL 2.2 11/16/2018 0932   VLDL 17 11/16/2018 0932   LDLCALC 52 11/16/2018 0932     Wt Readings from Last 3 Encounters:  12/16/18 145 lb 12.8 oz (66.1 kg)  11/16/18 148 lb 12.8 oz (67.5 kg)  07/15/18 150 lb 9.6 oz (68.3 kg)     Assessment and Plan:    1. CAD without angina: She is known to have CAD with anterior MI in July 2017 with placement of a single DES in the mid LAD. She had prolonged ischemic time and now has LV dysfunction. Cardiac cath October 2019 with patent LAD stent and no disease in the other vessels. Continue statin. She is on sotalol for PVCs.     2. PAF/PVCs: Sinus today. Will continue Sotalol and Xarelto. She is followed by Dr. Raul Del in the EP clinic. Of note, she did not tolerate amiodarone.    3. Chronic systolic CHF/Ischemic cardiomyopathy: Weight stable. Volume status ok. LVEF 30% by echo August 2019. ICD in place. Continue current therapy. She is followed in the advanced heart failure clinic. She is having a repeat echo in December 2020.   4. Mitral regurgitation: Moderate by echo August 2019. Repeat echo December 2020  Follow up with me in one year  Signed, Verne Carrow, MD 12/16/2018 12:24 PM    [facility] [address] [place], [place], [place]  [address] Phone: [phone]; Fax: [phone]

## 2018-12-16 ENCOUNTER — Ambulatory Visit: Payer: BC Managed Care – PPO | Admitting: Cardiovascular Disease

## 2018-12-16 ENCOUNTER — Other Ambulatory Visit: Payer: Self-pay

## 2018-12-16 ENCOUNTER — Encounter: Payer: Self-pay | Admitting: Cardiovascular Disease

## 2018-12-16 VITALS — BP 88/56 | HR 56 | Ht 69.0 in | Wt 145.8 lb

## 2018-12-16 DIAGNOSIS — I5022 Chronic systolic (congestive) heart failure: Secondary | ICD-10-CM | POA: Diagnosis not present

## 2018-12-16 DIAGNOSIS — I255 Ischemic cardiomyopathy: Secondary | ICD-10-CM

## 2018-12-16 DIAGNOSIS — I2510 Atherosclerotic heart disease of native coronary artery without angina pectoris: Secondary | ICD-10-CM | POA: Diagnosis not present

## 2018-12-16 DIAGNOSIS — I493 Ventricular premature depolarization: Secondary | ICD-10-CM | POA: Diagnosis not present

## 2018-12-16 DIAGNOSIS — I480 Paroxysmal atrial fibrillation: Secondary | ICD-10-CM

## 2018-12-16 NOTE — Patient Instructions (Signed)

## 2018-12-21 ENCOUNTER — Ambulatory Visit (INDEPENDENT_AMBULATORY_CARE_PROVIDER_SITE_OTHER): Payer: BC Managed Care – PPO | Admitting: *Deleted

## 2018-12-21 DIAGNOSIS — I480 Paroxysmal atrial fibrillation: Secondary | ICD-10-CM

## 2018-12-21 DIAGNOSIS — I509 Heart failure, unspecified: Secondary | ICD-10-CM | POA: Diagnosis not present

## 2018-12-21 LAB — CUP PACEART REMOTE DEVICE CHECK
Battery Remaining Longevity: 77 mo
Battery Remaining Percentage: 74 %
Battery Voltage: 2.98 V
Brady Statistic RV Percent Paced: 1 %
Date Time Interrogation Session: 20201027060016
HighPow Impedance: 66 Ohm
HighPow Impedance: 66 Ohm
Implantable Lead Implant Date: 20171129
Implantable Lead Location: 753860
Implantable Pulse Generator Implant Date: 20171129
Lead Channel Impedance Value: 330 Ohm
Lead Channel Pacing Threshold Amplitude: 0.75 V
Lead Channel Pacing Threshold Pulse Width: 0.5 ms
Lead Channel Sensing Intrinsic Amplitude: 12 mV
Lead Channel Setting Pacing Amplitude: 2.5 V
Lead Channel Setting Pacing Pulse Width: 0.5 ms
Lead Channel Setting Sensing Sensitivity: 0.5 mV
Pulse Gen Serial Number: 7377983

## 2018-12-27 ENCOUNTER — Other Ambulatory Visit: Payer: Self-pay | Admitting: Cardiovascular Disease

## 2018-12-27 MED ORDER — RIVAROXABAN 20 MG PO TABS: ORAL_TABLET | ORAL | 1 refills | Status: DC

## 2018-12-27 NOTE — Telephone Encounter (Signed)
Prescription refill request for Xarelto received.    Last office visit: Mcalhany (12-16-2018) Weight: 66.1 kg Age: 53 y.o. Scr: 1. 34 (11-16-2018) CrCl: 51 ml/min

## 2019-01-11 NOTE — Progress Notes (Signed)
Remote ICD transmission.   

## 2019-02-08 ENCOUNTER — Ambulatory Visit (INDEPENDENT_AMBULATORY_CARE_PROVIDER_SITE_OTHER): Payer: BC Managed Care – PPO | Admitting: Cardiology

## 2019-02-08 ENCOUNTER — Encounter: Payer: Self-pay | Admitting: Cardiology

## 2019-02-08 ENCOUNTER — Other Ambulatory Visit: Payer: Self-pay

## 2019-02-08 VITALS — BP 100/62 | HR 58 | Ht 69.0 in | Wt 149.0 lb

## 2019-02-08 DIAGNOSIS — I480 Paroxysmal atrial fibrillation: Secondary | ICD-10-CM

## 2019-02-08 DIAGNOSIS — I493 Ventricular premature depolarization: Secondary | ICD-10-CM

## 2019-02-08 NOTE — Patient Instructions (Signed)
Medication Instructions:  Your physician recommends that you continue on your current medications as directed. Please refer to the Current Medication list given to you today.  *If you need a refill on your cardiac medications before your next appointment, please call your pharmacy*  Labwork: None ordered  Testing/Procedures: None ordered  Follow-Up: Remote monitoring is used to monitor your Pacemaker or ICD from home. This monitoring reduces the number of office visits required to check your device to one time per year. It allows [place] to keep an eye on the functioning of your device to ensure it is working properly. You are scheduled for a device check from home on 03/22/2019. You may send your transmission at any time that day. If you have a wireless device, the transmission will be sent automatically. After your physician reviews your transmission, you will receive a postcard with your next transmission date.  At [facility], you and your health needs are our priority.  As part of our continuing mission to provide you with exceptional heart care, we have created designated Provider Care Teams.  These Care Teams include your primary Cardiologist (physician) and Advanced Practice Providers (APPs -  Physician Assistants and Nurse Practitioners) who all work together to provide you with the care you need, when you need it.  You will need a follow up appointment in 12 months.  Please call our office 2 months in advance to schedule this appointment.  You may see Dr Curt Bears or one of the following Advanced Practice Providers on your designated Care Team:    Chanetta Marshall, NP  Tommye Standard, PA-C  [place], [place]  Thank you for choosing [facility]!!   Trinidad Curet, RN [phone]

## 2019-02-08 NOTE — Progress Notes (Signed)
Electrophysiology Office Note   Date:  02/08/2019   ID:  Catherine Mays, DOB 13-Oct-1965, MRN [id number]  PCP:  Benita Stabile, MD  Cardiologist:  Janett Labella Primary Electrophysiologist:  Regan Lemming, MD    No chief complaint on file.    History of Present Illness: Catherine Mays is a 53 y.o. female who presents today for electrophysiology evaluation.   History of CAD (STEMI 08/2015 s/p PTCA/DES to mid LAD, mild residual mRCA), ischemic cardiomyopathy, chronic systolic CHF, paroxysmal atrial fib (dx at time of STEMI), PVCs, hypotension. She had presented to the [facility] ED with chest pain that night and was sent home. She came back in later that night with continued chest pain and EKG showed ST elevation c/w an anterior MI. Emergent transport to [facility] and cardiac cath with occluded mid LAD treated with DES x 1 on 09/20/15. She was discharged with a LifeVest for LV dysfunction, amiodarone for her atrial fib, and triple blood thinner therapy for both her MI and afib. ASA stopped after one month. Coreg cut back due to fatigue. Aldactone stopped due to worsened kidney function. She was taken off amiodarone due to poor appetite, 20 lb weight loss, nausea, dyspnea, weakness, fatigue and cough. ICD implanted 01/23/16.  She was noted to have a high burden of PVCs with a Holter monitor showing a burden of 41%. Had PVC ablation attempt which resulted in no PVCs during immediately post ablation but return of PVCs the next morning.  PVCs were coming from the posterior medial papillary muscle.  Today, denies symptoms of palpitations, chest pain, shortness of breath, orthopnea, PND, lower extremity edema, claudication, dizziness, presyncope, syncope, bleeding, or neurologic sequela. The patient is tolerating medications without difficulties.  Is been feeling well.  She has no chest pain or shortness of breath.  She is able to do all of her daily activities without restriction.  She has been  getting a little bit dizzy.  Dizziness occurs when she stands up.  I told her that this is likely due to her medications as well as her heart failure.  [name].  Past Medical History:  Diagnosis Date  . AICD (automatic cardioverter/defibrillator) present    [employer]. Jude  . CAD in native artery    a. late recognition of presentation of STEMI 08/2015 s/p DES to LAD, mild residual mRCA.  Marland Kitchen Chronic systolic CHF (congestive heart failure) (HCC)   . CKD (chronic kidney disease), stage III   . Hypotension    a. preventing med titration for HF.  Marland Kitchen Hypothyroidism 09/24/2015  . Ischemic cardiomyopathy   . Myocardial infarction (HCC) 08/2015  . PAF (paroxysmal atrial fibrillation) (HCC) 09/24/2015   a. dx at time of STEMI 08/2015.  . Pre-diabetes   . Presence of permanent cardiac pacemaker   . PVC's (premature ventricular contractions)    Past Surgical History:  Procedure Laterality Date  . CARDIAC CATHETERIZATION N/A 09/20/2015   Procedure: Left Heart Cath and Coronary Angiography;  Surgeon: Kathleene Hazel, MD;  Location[facility] INVASIVE CV LAB;  Service: Cardiovascular;  Laterality: N/A;  . CARDIAC CATHETERIZATION N/A 09/20/2015   Procedure: Coronary Stent Intervention;  Surgeon: Kathleene Hazel, MD;  Location: [facility] INVASIVE CV LAB;  Service: Cardiovascular;  Laterality: N/A;  . CARDIAC CATHETERIZATION N/A 09/21/2015   Procedure: Left Heart Cath and Coronary Angiography;  Surgeon: Tonny Bollman, MD;  Location: [facility] INVASIVE CV LAB;  Service: Cardiovascular;  Laterality: N/A;  . CARDIAC  CATHETERIZATION N/A 11/26/2015   Procedure: Right Heart Cath;  Surgeon: Larey Dresser, MD;  Location: [place] CV LAB;  Service: Cardiovascular;  Laterality: N/A;  . COLONOSCOPY WITH PROPOFOL N/A 11/23/2017   Procedure: COLONOSCOPY WITH PROPOFOL;  Surgeon: Daneil Dolin, MD;  Location: [facility] ENDO SUITE;  Service: Endoscopy;  Laterality: N/A;  8:45am  . CORONARY STENT  PLACEMENT  09/20/2015  . EP IMPLANTABLE DEVICE N/A 01/23/2016   Procedure: ICD Implant;  Surgeon: Cindi Ghazarian Meredith Leeds, MD;  Location: [place] CV LAB;  Service: Cardiovascular;  Laterality: N/A;  . LEFT HEART CATH AND CORONARY ANGIOGRAPHY N/A 12/18/2017   Procedure: LEFT HEART CATH AND CORONARY ANGIOGRAPHY;  Surgeon: Larey Dresser, MD;  Location: [place] CV LAB;  Service: Cardiovascular;  Laterality: N/A;  . PVC ABLATION N/A 04/02/2017   Procedure: PVC ABLATION;  Surgeon: Constance Haw, MD;  Location: [place] CV LAB;  Service: Cardiovascular;  Laterality: N/A;  . TEE WITHOUT CARDIOVERSION N/A 10/08/2016   Procedure: TRANSESOPHAGEAL ECHOCARDIOGRAM (TEE);  Surgeon: Larey Dresser, MD;  Location: [facility] ENDOSCOPY;  Service: Cardiovascular;  Laterality: N/A;  . THYROIDECTOMY       Current Outpatient Medications  Medication Sig Dispense Refill  . buPROPion (WELLBUTRIN XL) 150 MG 24 hr tablet Take 150 mg by mouth daily.    . Calcium Carb-Cholecalciferol (CALCIUM + D3 PO) Take 1 tablet by mouth every morning.    . Coenzyme Q10 (COQ10) 200 MG CAPS Take 200 mg by mouth daily.    . fexofenadine (ALLEGRA) 180 MG tablet Take 180 mg by mouth daily.    . furosemide (LASIX) 20 MG tablet TAKE 1 TABLET(20 MG) BY MOUTH EVERY OTHER DAY 30 tablet 3  . losartan (COZAAR) 25 MG tablet Take 1 tablet (25 mg total) by mouth at bedtime AND 0.5 tablets (12.5 mg total) every morning. 135 tablet 3  . nitroGLYCERIN (NITROSTAT) 0.4 MG SL tablet Place 1 tablet (0.4 mg total) under the tongue every 5 (five) minutes as needed for chest pain. 25 tablet 3  . pantoprazole (PROTONIX) 40 MG tablet Take 1 tablet (40 mg total) by mouth daily. Please keep upcoming appt with Dr. Angelena Form in October before anymore refills. Thank you 90 tablet 0  . potassium chloride SA (K-DUR) 20 MEQ tablet Take 2 tablets (40 mEq total) by mouth every other day. 30 tablet 6  . rivaroxaban (XARELTO) 20 MG TABS tablet TAKE 1 TABLET EVERY  DAY WITH SUPPER 90 tablet 1  . rosuvastatin (CRESTOR) 10 MG tablet Take 1 tablet (10 mg total) by mouth daily. 90 tablet 3  . sotalol (BETAPACE) 160 MG tablet Take 1 tablet (160 mg total) by mouth 2 (two) times daily. 20 tablet 0  . spironolactone (ALDACTONE) 25 MG tablet TAKE 1 TABLET(25 MG) BY MOUTH DAILY 90 tablet 3  . SYNTHROID 112 MCG tablet Take 112 mcg by mouth every morning.   5  . traZODone (DESYREL) 50 MG tablet Take 0.5 tablets (25 mg total) by mouth at bedtime as needed for sleep. 30 tablet 0  . triamcinolone (NASACORT ALLERGY 24HR) 55 MCG/ACT AERO nasal inhaler Place 2 sprays into the nose daily.      No current facility-administered medications for this visit.   Facility-Administered Medications Ordered in Other Visits  Medication Dose Route Frequency Provider Last Rate Last Admin  . bivalirudin (ANGIOMAX) 250 mg in sodium chloride 0.9 % 50 mL (5 mg/mL) infusion    Continuous PRN Burnell Blanks, MD   Stopped  at 09/20/15 0733  . bivalirudin (ANGIOMAX) BOLUS via infusion    PRN Kathleene HazelMcAlhany, Christopher D, MD   54.45 mg at 09/20/15 [id number]  . fentaNYL (SUBLIMAZE) injection    PRN Kathleene HazelMcAlhany, Christopher D, MD   25 mcg at 09/20/15 0651  . heparin 1,500 mL    PRN Kathleene HazelMcAlhany, Christopher D, MD   Given at 09/20/15 [phone]  . heparin infusion 2 units/mL in 0.9 % sodium chloride    Continuous PRN Kathleene HazelMcAlhany, Christopher D, MD   1,500 mL at 09/20/15 0733  . iopamidol (ISOVUE-370) 76 % injection    PRN Kathleene HazelMcAlhany, Christopher D, MD   165 mL at 09/20/15 0733  . lidocaine (PF) (XYLOCAINE) 1 % injection    PRN Kathleene HazelMcAlhany, Christopher D, MD   2 mL at 09/20/15 [id number]  . midazolam (VERSED) injection    PRN Kathleene HazelMcAlhany, Christopher D, MD   1 mg at 09/20/15 0651  . nitroGLYCERIN 1 mg/10 ml (100 mcg/ml) - IR/CATH LAB    PRN Kathleene HazelMcAlhany, Christopher D, MD   200 mcg at 09/20/15 [id number]  . Radial Cocktail/Verapamil only    PRN Kathleene HazelMcAlhany, Christopher D, MD   10 mL at 09/20/15 0629  . ticagrelor (BRILINTA) tablet    PRN  Kathleene HazelMcAlhany, Christopher D, MD   180 mg at 09/20/15 [phone]  . tirofiban (AGGRASTAT) bolus via infusion    PRN Kathleene HazelMcAlhany, Christopher D, MD   1,815 mcg at 09/20/15 [phone]  . tirofiban (AGGRASTAT) infusion 50 mcg/mL 100 mL    Continuous PRN Kathleene HazelMcAlhany, Christopher D, MD 13.1 mL/hr at 09/20/15 0651 0.15 mcg/kg/min at 09/20/15 [id number]    Allergies:   Paxil [paroxetine], Morphine and related, Percocet [oxycodone-acetaminophen], Cefdinir, and Zolpidem   Social History:  The patient  reports that she has never smoked. She has never used smokeless tobacco. She reports that she does not drink alcohol or use drugs.   Family History:  The patient's family history includes Arrhythmia in her mother; Heart attack in her father.   ROS:  Please see the history of present illness.   Otherwise, review of systems is positive for none.   All other systems are reviewed and negative.   PHYSICAL EXAM: VS:  Ht 5\' 9"  (1.753 m)   BMI 21.53 kg/m  , BMI Body mass index is 21.53 kg/m. GEN: Well nourished, well developed, in no acute distress  HEENT: normal  Neck: no JVD, carotid bruits, or masses Cardiac: RRR; no murmurs, rubs, or gallops,no edema  Respiratory:  clear to auscultation bilaterally, normal work of breathing GI: soft, nontender, nondistended, + BS MS: no deformity or atrophy  Skin: warm and dry, device site well healed Neuro:  Strength and sensation are intact Psych: euthymic mood, full affect  EKG:  EKG is ordered today. Personal review of the ekg ordered shows sinus rhythm, poor R wave progression, rate 58, IVCD  Personal review of the device interrogation today. Results in [facility]   Recent Labs: 11/16/2018: BUN 19; Creatinine, Ser 1.34; Hemoglobin 10.1; Platelets 195; Potassium 4.4; Sodium 138    Lipid Panel     Component Value Date/Time   CHOL 126 11/16/2018 0932   TRIG 83 11/16/2018 0932   HDL 57 11/16/2018 0932   CHOLHDL 2.2 11/16/2018 0932   VLDL 17 11/16/2018 0932   LDLCALC 52 11/16/2018 0932      Wt Readings from Last 3 Encounters:  12/16/18 145 lb 12.8 oz (66.1 kg)  11/16/18 148 lb 12.8 oz (67.5 kg)  07/15/18 150 lb 9.6 oz (68.3 kg)  Other studies Reviewed: Additional studies/ records that were reviewed today include: TEE 10/05/17 Review of the above records today demonstrates:  - Left ventricle: The cavity size was mildly dilated. Wall   thickness was normal. The estimated ejection fraction was 30%.   Akinesis of the inferoseptum and anteroseptum. Apical inferior   and apical anterior akinesis. Akinesis of the apex. Features are   consistent with a pseudonormal left ventricular filling pattern,   with concomitant abnormal relaxation and increased filling   pressure (grade 2 diastolic dysfunction). - Aortic valve: There was no stenosis. - Mitral valve: There was moderate regurgitation. - Left atrium: The atrium was moderately dilated. - Right ventricle: The cavity size was normal. Pacer wire or   catheter noted in right ventricle. Systolic function was normal. - Tricuspid valve: Peak RV-RA gradient (S): 34 mm Hg. - Pulmonary arteries: PA peak pressure: 37 mm Hg (S). - Inferior vena cava: The vessel was normal in size. The   respirophasic diameter changes were in the normal range (>= 50%),   consistent with normal central venous pressure.  Holter 01/26/17 - personally reviewed Minimum HR: 58 BPM at 4:28:50 AM(2) Maximum HR: 113 BPM at 10:19:38 AM Average HR: 86 BPM Ventricular Beats: 100073 (41%) Sinus rhythm with PVCs and atrial runs  LHC 12/18/17 No obstructive coronary disease, patent LAD stent.   ASSESSMENT AND PLAN:  1.  Coronary artery disease: No current chest pain.  Continue current management.  2. Paroxysmal atrial fibrillation: None seen since hospitalization initially.  No changes.  CHA2DS2-VASc of 3.    3. Ischemic cardiomyopathy: Status post [facility] Jude ICD.  Device functioning appropriately.  No changes.  4.  Moderate mitral regurgitation,  nonrheumatic: Likely functional due to ischemic cardiomyopathy.  No changes.  5.  PVCs: Holter monitor with 41% burden.  Ablation of the posterior medial papillary muscle with decrease in burden.  Currently on sotalol.  No changes.    Current medicines are reviewed at length with the patient today.   The patient does not have concerns regarding her medicines.  The following changes were made today: none  Labs/ tests ordered today include:  No orders of the defined types were placed in this encounter.    Disposition:   FU with Tayven Renteria 12 months  Signed, Kamile Fassler Jorja Loa, MD  02/08/2019 1:48 PM     [facility] [address] [place] [address] [phone] (office) [phone] (fax)

## 2019-02-21 ENCOUNTER — Ambulatory Visit (HOSPITAL_COMMUNITY)
Admission: RE | Admit: 2019-02-21 | Discharge: 2019-02-21 | Disposition: A | Discharge status: Home / Self Care | Payer: BC Managed Care – PPO | Source: Ambulatory Visit | Attending: Cardiology | Admitting: Cardiology

## 2019-02-21 ENCOUNTER — Encounter (HOSPITAL_COMMUNITY): Payer: Self-pay | Admitting: Cardiology

## 2019-02-21 ENCOUNTER — Ambulatory Visit (HOSPITAL_BASED_OUTPATIENT_CLINIC_OR_DEPARTMENT_OTHER)
Admission: RE | Admit: 2019-02-21 | Discharge: 2019-02-21 | Disposition: A | Discharge status: Home / Self Care | Payer: BC Managed Care – PPO | Source: Ambulatory Visit | Attending: Cardiology | Admitting: Cardiology

## 2019-02-21 ENCOUNTER — Other Ambulatory Visit: Payer: Self-pay

## 2019-02-21 VITALS — BP 100/62 | HR 56 | Wt 148.4 lb

## 2019-02-21 DIAGNOSIS — I493 Ventricular premature depolarization: Secondary | ICD-10-CM | POA: Diagnosis not present

## 2019-02-21 DIAGNOSIS — I340 Nonrheumatic mitral (valve) insufficiency: Secondary | ICD-10-CM | POA: Diagnosis not present

## 2019-02-21 DIAGNOSIS — E785 Hyperlipidemia, unspecified: Secondary | ICD-10-CM | POA: Insufficient documentation

## 2019-02-21 DIAGNOSIS — I252 Old myocardial infarction: Secondary | ICD-10-CM | POA: Diagnosis not present

## 2019-02-21 DIAGNOSIS — I2510 Atherosclerotic heart disease of native coronary artery without angina pectoris: Secondary | ICD-10-CM | POA: Diagnosis not present

## 2019-02-21 DIAGNOSIS — I5022 Chronic systolic (congestive) heart failure: Secondary | ICD-10-CM | POA: Insufficient documentation

## 2019-02-21 DIAGNOSIS — Z955 Presence of coronary angioplasty implant and graft: Secondary | ICD-10-CM | POA: Diagnosis not present

## 2019-02-21 DIAGNOSIS — Z8249 Family history of ischemic heart disease and other diseases of the circulatory system: Secondary | ICD-10-CM | POA: Diagnosis not present

## 2019-02-21 DIAGNOSIS — Z79890 Hormone replacement therapy (postmenopausal): Secondary | ICD-10-CM | POA: Diagnosis not present

## 2019-02-21 DIAGNOSIS — Z95810 Presence of automatic (implantable) cardiac defibrillator: Secondary | ICD-10-CM | POA: Insufficient documentation

## 2019-02-21 DIAGNOSIS — I255 Ischemic cardiomyopathy: Secondary | ICD-10-CM | POA: Diagnosis not present

## 2019-02-21 DIAGNOSIS — Z7901 Long term (current) use of anticoagulants: Secondary | ICD-10-CM | POA: Diagnosis not present

## 2019-02-21 DIAGNOSIS — I480 Paroxysmal atrial fibrillation: Secondary | ICD-10-CM | POA: Insufficient documentation

## 2019-02-21 DIAGNOSIS — Z79899 Other long term (current) drug therapy: Secondary | ICD-10-CM | POA: Insufficient documentation

## 2019-02-21 LAB — BASIC METABOLIC PANEL
Anion gap: 8 (ref 5–15)
BUN: 20 mg/dL (ref 6–20)
CO2: 26 mmol/L (ref 22–32)
Calcium: 9.1 mg/dL (ref 8.9–10.3)
Chloride: 102 mmol/L (ref 98–111)
Creatinine, Ser: 1.19 mg/dL — ABNORMAL HIGH (ref 0.44–1.00)
GFR calc Af Amer: >60 mL/min (ref >60)
GFR calc non Af Amer: 52 mL/min — ABNORMAL LOW (ref >60)
Glucose, Bld: 99 mg/dL (ref 70–99)
Potassium: 4.6 mmol/L (ref 3.5–5.1)
Sodium: 136 mmol/L (ref 135–145)

## 2019-02-21 MED ORDER — DAPAGLIFLOZIN PROPANEDIOL 10 MG PO TABS: 10.0000 mg | ORAL_TABLET | Freq: Every day | ORAL | 6 refills | Status: DC

## 2019-02-21 NOTE — Patient Instructions (Addendum)
STOP Lasix (Furosmide)  STOP Potassium  START Farxiga (Dapagliflozin) 10mg  (1 tab) daily  Labs today and repeat in 10 days We will only contact you if something comes back abnormal or we need to make some changes. Otherwise no news is good news!   Your physician recommends that you schedule a follow-up appointment in: 3 months with Dr Aundra Dubin.   At the [facility], you and your health needs are our priority. As part of our continuing mission to provide you with exceptional heart care, we have created designated Provider Care Teams. These Care Teams include your primary Cardiologist (physician) and Advanced Practice Providers (APPs- Physician Assistants and Nurse Practitioners) who all work together to provide you with the care you need, when you need it.   You may see any of the following providers on your designated Care Team at your next follow up: Marland Kitchen Dr Glori Bickers . Dr Loralie Champagne . Darrick Grinder, NP . Lyda Jester, PA . Audry Riles, PharmD   Please be sure to bring in all your medications bottles to every appointment.

## 2019-02-21 NOTE — Progress Notes (Signed)
PCP: Dr. Margo Aye HF Cardiology: Dr. Shirlee Latch  53 y.o. with history of CAD s/p anterior STEMI with DES to LAD, ischemic cardiomyopathy, mitral regurgitation, and paroxysmal atrial fibrillation presents for followup of CHF and CAD.  She was admitted in 7/17 with anterior STEMI.  She had a late presentation to the cath lab.  Echo in 10/17 showed EF about 25% with LAD-territory wall motion abnormalities.  She had peri-MI atrial fibrillation and was started on Xarelto.  She was put on amiodarone.   After discharge, she continued to feel poorly.  She developed nausea, anorexia, and significant fatigue.  She was admitted for evaluation in 10/17 given concern for low output heart failure.  However, stopping amiodarone essentially resolved her symptoms.  She had RHC showing preserved cardiac output but the PCWP was high due to prominent v-waves, presumably from significant mitral regurgitation.  Of note, she was in atrial fibrillation for a time during this admission.   She had St Jude ICD placed.  She is back at work for the [employer].   TEE in 8/18 to evaluate the mitral valve showed EF 30%, moderate MR (probably functional).    Patient was seen by Dr. Elberta Fortis and noted to have very frequent PVCs on device interrogation.  Holter in 12/18 showed 41% PVCs.  She was started on sotalol 80 mg bid and titrated up to 120 mg bid.  Coreg was stopped with the addition of sotalol and losartan was decreased to 12.5 mg daily due to low BP.  She had PVC ablation in 2/19 but PVCs recurred. Sotalol was increased to 160 mg bid.   Echo 8/19 showed EF 30% with regional wall motion abnormalities, normal RV size and systolic function, moderate MR.   She was admitted in 10/19 with chest pain.  Cath showed patent LAD stent and no obstructive CAD.   Echo was done today and reviewed, EF 30% with LAD wall motion abnormalities, normal RV, moderate MR.   She returns for followup of CHF and CAD.  Weight is stable.  She has  been more lightheaded over the last month though she is not orthostatic in the office today.  No presyncope/syncope.  No falls.  No significant exertional dyspnea.  No chest pain.  No orthopnea/PND.   ECG (personally reviewed): NSR, IVCD 126 msec, QTc 405 msec  St Jude ICD interrogation: Stable thoracic impedance, no VT.   Labs (10/17): K 3.9, creatinine 1.24, hgb 10.3 Labs (11/17): K 4, creatinine 1.27 Labs (12/17): K 4.2, creatinine 1.12, BNP 439 Labs (2/18): K 4.7, creatinine 1.37 Labs (7/18): K 3.9, creatinine 1.14, hgb 11.7 Labs (12/18): K 4.5, creatinine 1.23 Labs (1/19): K 4.4, creatinine 1.14 Labs (2/19): LDL 97 (off atorvastatin), HDL 56 Labs (5/19): K 4.3, creatinine 1.1 Labs (10/19): K 4.2, creatinine 1.2, LDL 70, hgb 11.8 Labs (11/19): K 4.9, creatinine 1.17, hgb 11.8, LDL 68, low TSH  Labs (9/20): K 4.4, creatinine 1.34, LDL 52  PMH:  1. CAD: Anterior STEMI in 7/17 with late presentation to the cath lab. She had DES to proximal LAD.  No significant disease in other vessels.  - LHC (10/19): Patent LAD stent, no obstructive disease.  2. Chronic systolic CHF: Ischemic cardiomyopathy.  - Echo 10/17 with EF 25%, akinesis of the mid-apical anteroseptal and anterior walls, apical dyskinesis, moderate to severe MR with incomplete coaptation.  - RHC (10/17): mean RA 4, PA 59/17 mean 38, mean PCWP 27 with prominent V waves suggesting significant MR, CI 3.06, PVR 2  WU.  - ACEI cough.  - Echo (11/17): EF 30-35%, normal RV size and systolic function, severe MR with restriction of posterior leaflet.   - Echo (8/19): EF 30% with regional wall motion abnormalities, normal RV size and systolic function, moderate MR.  - Echo (12/20): EF 30%, LAD wall motion abnormalities, normal RV, PASP 35, moderate functional MR.  3. Atrial fibrillation: Paroxysmal.  Noted 7/17 admission peri-MI, also noted again 10/17 admission. She did not tolerate amiodarone due to nausea/anorexia.  4. Mitral  regurgitation: Moderate to severe on 10/17 echo, incomplete coaptation.  ?Etiology, her MI (anterior) does not typically lead to ischemic MR. - Echo in 11/17 with severe MR, posterior leaflet restricted.  - TEE (8/18): EF 30%, moderately dilated LV with akinetic septal and anterior walls, moderate functional MR, normal RV size and systolic function.  - Echo (8/19): Moderate MR 5. Hyperlipidemia: Myalgias with atorvastatin.  6. PVCs: Holter 12/18 with 41% PVCs.  - PVC ablation in 2/19 with recurrence of PVCs.   SH: Married, [place] in [place], works for the [employer], no smoking or ETOH.   Family History  Problem Relation Age of Onset  . Heart attack Father   . Arrhythmia Mother   . Colon cancer Neg Hx   . Colon polyps Neg Hx    ROS: All systems reviewed and negative except as per HPI.   Current Outpatient Medications on File Prior to Encounter  Medication Sig Dispense Refill  . buPROPion (WELLBUTRIN XL) 150 MG 24 hr tablet Take 150 mg by mouth daily.    . Calcium Carb-Cholecalciferol (CALCIUM + D3 PO) Take 1 tablet by mouth every morning.    . Coenzyme Q10 (COQ10) 200 MG CAPS Take 200 mg by mouth daily.    . fexofenadine (ALLEGRA) 180 MG tablet Take 180 mg by mouth daily.    Marland Kitchen. losartan (COZAAR) 25 MG tablet Take 1 tablet (25 mg total) by mouth at bedtime AND 0.5 tablets (12.5 mg total) every morning. 135 tablet 3  . nitroGLYCERIN (NITROSTAT) 0.4 MG SL tablet Place 1 tablet (0.4 mg total) under the tongue every 5 (five) minutes as needed for chest pain. 25 tablet 3  . pantoprazole (PROTONIX) 40 MG tablet Take 1 tablet (40 mg total) by mouth daily. Please keep upcoming appt with Dr. Clifton JamesMcalhany in October before anymore refills. Thank you 90 tablet 0  . rivaroxaban (XARELTO) 20 MG TABS tablet TAKE 1 TABLET EVERY DAY WITH SUPPER 90 tablet 1  . rosuvastatin (CRESTOR) 10 MG tablet Take 1 tablet (10 mg total) by mouth daily. 90 tablet 3  . sotalol (BETAPACE) 160 MG tablet Take 1 tablet (160 mg total) by  mouth 2 (two) times daily. 20 tablet 0  . spironolactone (ALDACTONE) 25 MG tablet TAKE 1 TABLET(25 MG) BY MOUTH DAILY 90 tablet 3  . SYNTHROID 112 MCG tablet Take 112 mcg by mouth every morning.   5  . traZODone (DESYREL) 50 MG tablet Take 0.5 tablets (25 mg total) by mouth at bedtime as needed for sleep. 30 tablet 0  . triamcinolone (NASACORT ALLERGY 24HR) 55 MCG/ACT AERO nasal inhaler Place 2 sprays into the nose daily.      Current Facility-Administered Medications on File Prior to Encounter  Medication Dose Route Frequency Provider Last Rate Last Admin  . bivalirudin (ANGIOMAX) 250 mg in sodium chloride 0.9 % 50 mL (5 mg/mL) infusion    Continuous PRN Kathleene HazelMcAlhany, Christopher D, MD   Stopped at 09/20/15 [phone]  . bivalirudin (ANGIOMAX) BOLUS via  infusion    PRN Burnell Blanks, MD   54.45 mg at 09/20/15 [phone]  . fentaNYL (SUBLIMAZE) injection    PRN Burnell Blanks, MD   25 mcg at 09/20/15 0651  . heparin 1,500 mL    PRN Burnell Blanks, MD   Given at 09/20/15 [phone]  . heparin infusion 2 units/mL in 0.9 % sodium chloride    Continuous PRN Burnell Blanks, MD   1,500 mL at 09/20/15 0733  . iopamidol (ISOVUE-370) 76 % injection    PRN Burnell Blanks, MD   165 mL at 09/20/15 0733  . lidocaine (PF) (XYLOCAINE) 1 % injection    PRN Burnell Blanks, MD   2 mL at 09/20/15 4010  . midazolam (VERSED) injection    PRN Burnell Blanks, MD   1 mg at 09/20/15 0651  . nitroGLYCERIN 1 mg/10 ml (100 mcg/ml) - IR/CATH LAB    PRN Burnell Blanks, MD   200 mcg at 09/20/15 2725  . Radial Cocktail/Verapamil only    PRN Burnell Blanks, MD   10 mL at 09/20/15 0629  . ticagrelor (BRILINTA) tablet    PRN Burnell Blanks, MD   180 mg at 09/20/15 [phone]  . tirofiban (AGGRASTAT) bolus via infusion    PRN Burnell Blanks, MD   1,815 mcg at 09/20/15 [phone]  . tirofiban (AGGRASTAT) infusion 50 mcg/mL 100 mL    Continuous PRN Burnell Blanks, MD 13.1 mL/hr at 09/20/15 0651 0.15 mcg/kg/min at 09/20/15 0651    BP 100/62   Pulse (!) 56   Wt 67.3 kg (148 lb 6.4 oz)   SpO2 100%   BMI 21.91 kg/m  General: NAD Neck: No JVD, no thyromegaly or thyroid nodule.  Lungs: Clear to auscultation bilaterally with normal respiratory effort. CV: Nondisplaced PMI.  Heart regular S1/S2, no S3/S4,1/6 HSM apex.  No peripheral edema.  No carotid bruit.  Normal pedal pulses.  Abdomen: Soft, nontender, no hepatosplenomegaly, no distention.  Skin: Intact without lesions or rashes.  Neurologic: Alert and oriented x 3.  Psych: Normal affect. Extremities: No clubbing or cyanosis.  HEENT: Normal.   Assessment/Plan: 1. CAD: S/p anterior MI in 7/17 with DES to RCA.  Cath in 10/19 with no obstructive CAD.  No further chest pain.  - She is off Plavix now and taking only Xarelto 20 mg daily.  - She is tolerating Crestor without myalgias.  Good lipids in 9/20.       2. Chronic systolic CHF: Ischemic cardiomyopathy.  EF 25% on echo in 10/17, 30-35% on echo in 11/17, 30% on TEE in 8/18. She has extensive LAD-territory scar. She has a [employer] officer, political party ICD.  Echo in 12/20 showed that EF remains 30% with LAD-territory scar.  She is not volume overloaded on exam. NYHA class II symptoms. Some lightheadedness recently though not orthostatic today.  - Off Coreg with low BP and addition of sotalol.  No BP room to add back Coreg.  - Continue losartan 12.5 mg qam/25 mg qpm, no BP room to increase.  BMET today.    - With lightheadedness, I will try stopping Lasix/KCl and starting dapagliflozin 10 mg daily.  BMET 10 days.    - Continue spironolactone 25 daily.  3. Mitral regurgitation: Moderate to severe on 10/17 echo, severe on 11/17 echo.  I did a TEE in 8/18.  This showed moderate MR that appeared to be functional.  No indication for surgery or Mitraclip. Echo 12/20  showed moderate MR, functional.  4. Atrial fibrillation: Unable to tolerate amiodarone due to GI side  effects.  She is in NSR today on Xarelto 20 mg daily and sotalol. QTc interval is acceptable on sotalol on today's ECG.   - Continue Xarelto.   5. PVCs: Very frequent on 12/18 holter (41% beats), she has had a PVC ablation in 2/19. Due to recurrence of PVCs post-ablation, sotalol was increased to 160 mg bid. She does not seem to have many PVCs now and overall feels better.  - Continue sotalol, QTc ok on ECG today.  6. Hyperlipidemia: Tolerating Crestor, good lipids in 9/20.   Followup in 3 months  Marca Ancona 02/21/2019

## 2019-02-21 NOTE — Progress Notes (Signed)
  Echocardiogram 2D Echocardiogram has been performed.  Catherine Mays 02/21/2019, 10:43 AM

## 2019-03-03 LAB — CUP PACEART INCLINIC DEVICE CHECK
Date Time Interrogation Session: 20201215095525
Implantable Lead Implant Date: 20171129
Implantable Lead Location: 753860
Implantable Pulse Generator Implant Date: 20171129
Pulse Gen Serial Number: 7377983

## 2019-03-04 ENCOUNTER — Other Ambulatory Visit: Payer: Self-pay

## 2019-03-04 MED ORDER — PANTOPRAZOLE SODIUM 40 MG PO TBEC: 40.0000 mg | DELAYED_RELEASE_TABLET | Freq: Every day | ORAL | 3 refills | Status: DC

## 2019-03-16 ENCOUNTER — Other Ambulatory Visit: Payer: Self-pay

## 2019-03-21 ENCOUNTER — Other Ambulatory Visit (HOSPITAL_COMMUNITY): Payer: Self-pay

## 2019-03-21 NOTE — Telephone Encounter (Signed)
Opened in Error.

## 2019-03-22 ENCOUNTER — Ambulatory Visit (INDEPENDENT_AMBULATORY_CARE_PROVIDER_SITE_OTHER): Payer: BC Managed Care – PPO | Admitting: *Deleted

## 2019-03-22 DIAGNOSIS — I509 Heart failure, unspecified: Secondary | ICD-10-CM | POA: Diagnosis not present

## 2019-03-22 LAB — CUP PACEART REMOTE DEVICE CHECK
Battery Remaining Longevity: 75 mo
Battery Remaining Percentage: 72 %
Battery Voltage: 2.98 V
Brady Statistic RV Percent Paced: 1 %
Date Time Interrogation Session: 20210126020015
HighPow Impedance: 63 Ohm
HighPow Impedance: 63 Ohm
Implantable Lead Implant Date: 20171129
Implantable Lead Location: 753860
Implantable Pulse Generator Implant Date: 20171129
Lead Channel Impedance Value: 330 Ohm
Lead Channel Pacing Threshold Amplitude: 1.25 V
Lead Channel Pacing Threshold Pulse Width: 0.5 ms
Lead Channel Sensing Intrinsic Amplitude: 12 mV
Lead Channel Setting Pacing Amplitude: 2.5 V
Lead Channel Setting Pacing Pulse Width: 0.5 ms
Lead Channel Setting Sensing Sensitivity: 0.5 mV
Pulse Gen Serial Number: 7377983

## 2019-04-11 ENCOUNTER — Other Ambulatory Visit: Payer: Self-pay | Admitting: Cardiology

## 2019-05-09 ENCOUNTER — Telehealth (HOSPITAL_COMMUNITY): Payer: Self-pay | Admitting: *Deleted

## 2019-05-09 NOTE — Telephone Encounter (Signed)
Pt left VM stating she was SOB, had weak legs, exhausted, and just doesn't feel well. Pt stated she went to her PCP and they drew a BNP that was 650 and they also drew a CBC and her hemoglobin was low 8.  I spoke with Dinah Beers that stated these symptoms could be from her low hemoglobin I called patient back to see how here pcp was treating her low hemoglobin no answer/left vm to return my call.

## 2019-05-16 ENCOUNTER — Encounter (HOSPITAL_COMMUNITY): Payer: Self-pay

## 2019-05-16 ENCOUNTER — Other Ambulatory Visit: Payer: Self-pay

## 2019-05-16 ENCOUNTER — Encounter (HOSPITAL_COMMUNITY)
Admission: RE | Admit: 2019-05-16 | Discharge: 2019-05-16 | Disposition: A | Discharge status: Home / Self Care | Payer: BC Managed Care – PPO | Source: Ambulatory Visit | Attending: Internal Medicine | Admitting: Internal Medicine

## 2019-05-16 DIAGNOSIS — D509 Iron deficiency anemia, unspecified: Secondary | ICD-10-CM | POA: Diagnosis not present

## 2019-05-16 MED ORDER — SODIUM CHLORIDE 0.9 % IV SOLN
510.0000 mg | Freq: Once | INTRAVENOUS | Status: AC
  Administered 2019-05-16: 510 mg via INTRAVENOUS
  Filled 2019-05-16: qty 510

## 2019-05-16 MED ORDER — SODIUM CHLORIDE 0.9 % IV SOLN: Freq: Once | INTRAVENOUS | Status: AC

## 2019-05-19 ENCOUNTER — Other Ambulatory Visit: Payer: Self-pay

## 2019-05-19 ENCOUNTER — Encounter (HOSPITAL_COMMUNITY): Payer: Self-pay | Admitting: Cardiology

## 2019-05-19 ENCOUNTER — Ambulatory Visit (HOSPITAL_COMMUNITY)
Admission: RE | Admit: 2019-05-19 | Discharge: 2019-05-19 | Disposition: A | Discharge status: Home / Self Care | Payer: BC Managed Care – PPO | Source: Ambulatory Visit | Attending: Cardiology | Admitting: Cardiology

## 2019-05-19 VITALS — BP 82/54 | HR 60 | Wt 147.2 lb

## 2019-05-19 DIAGNOSIS — I2510 Atherosclerotic heart disease of native coronary artery without angina pectoris: Secondary | ICD-10-CM | POA: Insufficient documentation

## 2019-05-19 DIAGNOSIS — I480 Paroxysmal atrial fibrillation: Secondary | ICD-10-CM | POA: Insufficient documentation

## 2019-05-19 DIAGNOSIS — Z955 Presence of coronary angioplasty implant and graft: Secondary | ICD-10-CM | POA: Diagnosis not present

## 2019-05-19 DIAGNOSIS — K921 Melena: Secondary | ICD-10-CM | POA: Insufficient documentation

## 2019-05-19 DIAGNOSIS — Z79899 Other long term (current) drug therapy: Secondary | ICD-10-CM | POA: Insufficient documentation

## 2019-05-19 DIAGNOSIS — Z7901 Long term (current) use of anticoagulants: Secondary | ICD-10-CM | POA: Diagnosis not present

## 2019-05-19 DIAGNOSIS — Z8249 Family history of ischemic heart disease and other diseases of the circulatory system: Secondary | ICD-10-CM | POA: Insufficient documentation

## 2019-05-19 DIAGNOSIS — Z95810 Presence of automatic (implantable) cardiac defibrillator: Secondary | ICD-10-CM | POA: Insufficient documentation

## 2019-05-19 DIAGNOSIS — Z79890 Hormone replacement therapy (postmenopausal): Secondary | ICD-10-CM | POA: Diagnosis not present

## 2019-05-19 DIAGNOSIS — I255 Ischemic cardiomyopathy: Secondary | ICD-10-CM | POA: Diagnosis not present

## 2019-05-19 DIAGNOSIS — I252 Old myocardial infarction: Secondary | ICD-10-CM | POA: Insufficient documentation

## 2019-05-19 DIAGNOSIS — I340 Nonrheumatic mitral (valve) insufficiency: Secondary | ICD-10-CM | POA: Insufficient documentation

## 2019-05-19 DIAGNOSIS — D509 Iron deficiency anemia, unspecified: Secondary | ICD-10-CM | POA: Diagnosis not present

## 2019-05-19 DIAGNOSIS — E785 Hyperlipidemia, unspecified: Secondary | ICD-10-CM | POA: Insufficient documentation

## 2019-05-19 DIAGNOSIS — I5022 Chronic systolic (congestive) heart failure: Secondary | ICD-10-CM | POA: Insufficient documentation

## 2019-05-19 LAB — CBC
HCT: 32.3 % — ABNORMAL LOW (ref 36.0–46.0)
Hemoglobin: 9.3 g/dL — ABNORMAL LOW (ref 12.0–15.0)
MCH: 23.6 pg — ABNORMAL LOW (ref 26.0–34.0)
MCHC: 28.8 g/dL — ABNORMAL LOW (ref 30.0–36.0)
MCV: 82 fL (ref 80.0–100.0)
Platelets: 251 10*3/uL (ref 150–400)
RBC: 3.94 MIL/uL (ref 3.87–5.11)
RDW: 19 % — ABNORMAL HIGH (ref 11.5–15.5)
WBC: 4.6 10*3/uL (ref 4.0–10.5)
nRBC: 0 % (ref 0.0–0.2)

## 2019-05-19 LAB — BASIC METABOLIC PANEL
Anion gap: 8 (ref 5–15)
BUN: 21 mg/dL — ABNORMAL HIGH (ref 6–20)
CO2: 25 mmol/L (ref 22–32)
Calcium: 8.9 mg/dL (ref 8.9–10.3)
Chloride: 106 mmol/L (ref 98–111)
Creatinine, Ser: 1.22 mg/dL — ABNORMAL HIGH (ref 0.44–1.00)
GFR calc Af Amer: 59 mL/min — ABNORMAL LOW (ref >60)
GFR calc non Af Amer: 51 mL/min — ABNORMAL LOW (ref >60)
Glucose, Bld: 121 mg/dL — ABNORMAL HIGH (ref 70–99)
Potassium: 4.1 mmol/L (ref 3.5–5.1)
Sodium: 139 mmol/L (ref 135–145)

## 2019-05-19 MED ORDER — LOSARTAN POTASSIUM 25 MG PO TABS: 12.5000 mg | ORAL_TABLET | Freq: Every day | ORAL | 3 refills | Status: DC

## 2019-05-19 MED ORDER — SPIRONOLACTONE 25 MG PO TABS: 12.5000 mg | ORAL_TABLET | Freq: Every day | ORAL | 3 refills | Status: DC

## 2019-05-19 MED ORDER — FUROSEMIDE 20 MG PO TABS: 20.0000 mg | ORAL_TABLET | Freq: Every day | ORAL | 4 refills | Status: DC | PRN

## 2019-05-19 NOTE — Progress Notes (Signed)
Per Dr Shirlee Latch pt to take lasix as needed. Called patient and made her aware. Verbalized understanding.

## 2019-05-19 NOTE — Patient Instructions (Addendum)
DECREASE Losartan to 12.5mg  (1/2 tab) AT NIGHT ONLY  DECREASE Spironolactone to 12.5mg  (1/2 tab) daily  Labs today We will only contact you if something comes back abnormal or we need to make some changes. Otherwise no news is good news!  Your physician recommends that you schedule a follow-up appointment in: 3 weeks with the Nurse Practitioner/ Physician Assistant  April Garage Code: [id number]  Please call office at [phone] option [phone] if you have any questions or concerns.   At the [facility], you and your health needs are our priority. As part of our continuing mission to provide you with exceptional heart care, we have created designated Provider Care Teams. These Care Teams include your primary Cardiologist (physician) and Advanced Practice Providers (APPs- Physician Assistants and Nurse Practitioners) who all work together to provide you with the care you need, when you need it.   You may see any of the following providers on your designated Care Team at your next follow up: Marland Kitchen Dr Arvilla Meres . Dr Marca Ancona . Tonye Becket, NP . Robbie Lis, PA . Karle Plumber, PharmD   Please be sure to bring in all your medications bottles to every appointment.

## 2019-05-21 NOTE — Progress Notes (Signed)
PCP: Dr. Margo Aye HF Cardiology: Dr. Shirlee Latch  54 y.o. with history of CAD s/p anterior STEMI with DES to LAD, ischemic cardiomyopathy, mitral regurgitation, and paroxysmal atrial fibrillation presents for followup of CHF and CAD.  She was admitted in 7/17 with anterior STEMI.  She had a late presentation to the cath lab.  Echo in 10/17 showed EF about 25% with LAD-territory wall motion abnormalities.  She had peri-MI atrial fibrillation and was started on Xarelto.  She was put on amiodarone.   After discharge, she continued to feel poorly.  She developed nausea, anorexia, and significant fatigue.  She was admitted for evaluation in 10/17 given concern for low output heart failure.  However, stopping amiodarone essentially resolved her symptoms.  She had RHC showing preserved cardiac output but the PCWP was high due to prominent v-waves, presumably from significant mitral regurgitation.  Of note, she was in atrial fibrillation for a time during this admission.   She had St Jude ICD placed.  She is back at work for the [employer].   TEE in 8/18 to evaluate the mitral valve showed EF 30%, moderate MR (probably functional).    Patient was seen by Dr. Elberta Fortis and noted to have very frequent PVCs on device interrogation.  Holter in 12/18 showed 41% PVCs.  She was started on sotalol 80 mg bid and titrated up to 120 mg bid.  Coreg was stopped with the addition of sotalol and losartan was decreased to 12.5 mg daily due to low BP.  She had PVC ablation in 2/19 but PVCs recurred. Sotalol was increased to 160 mg bid.   Echo 8/19 showed EF 30% with regional wall motion abnormalities, normal RV size and systolic function, moderate MR.   She was admitted in 10/19 with chest pain.  Cath showed patent LAD stent and no obstructive CAD.   Echo was done today and reviewed, EF 30% with LAD wall motion abnormalities, normal RV, moderate MR.   I started her on Farxiga but she had to stop it after developing a  chronic yeast infection.   She returns for followup of CHF and CAD.  She has felt worse the last few weeks. She was found to be anemic with Fe deficiency (hgb down to 8).  She has had IV Fe.  She has been nauseated and has also noted coughing after eating. She is on a PPI. She will be seeing GI in [place] this week.  She has noted dark stool.  She has been more lightheaded recently, feels exhausted and dizzy all the time. Not particularly short of breath. No chest pain.  No syncope/presyncope.   ECG (personally reviewed): NSR at 54, poor RWP, lateral TWIs, QTc normal, IVCD 132 msec.   St Jude ICD interrogation: Stable thoracic impedance (increased), no VT.   Labs (10/17): K 3.9, creatinine 1.24, hgb 10.3 Labs (11/17): K 4, creatinine 1.27 Labs (12/17): K 4.2, creatinine 1.12, BNP 439 Labs (2/18): K 4.7, creatinine 1.37 Labs (7/18): K 3.9, creatinine 1.14, hgb 11.7 Labs (12/18): K 4.5, creatinine 1.23 Labs (1/19): K 4.4, creatinine 1.14 Labs (2/19): LDL 97 (off atorvastatin), HDL 56 Labs (5/19): K 4.3, creatinine 1.1 Labs (10/19): K 4.2, creatinine 1.2, LDL 70, hgb 11.8 Labs (11/19): K 4.9, creatinine 1.17, hgb 11.8, LDL 68, low TSH  Labs (9/20): K 4.4, creatinine 1.34, LDL 52 Labs (12/20): K 4.6, creatinine 1.19  PMH:  1. CAD: Anterior STEMI in 7/17 with late presentation to the cath lab. She had DES  to proximal LAD.  No significant disease in other vessels.  - LHC (10/19): Patent LAD stent, no obstructive disease.  2. Chronic systolic CHF: Ischemic cardiomyopathy.  - Echo 10/17 with EF 25%, akinesis of the mid-apical anteroseptal and anterior walls, apical dyskinesis, moderate to severe MR with incomplete coaptation.  - RHC (10/17): mean RA 4, PA 59/17 mean 38, mean PCWP 27 with prominent V waves suggesting significant MR, CI 3.06, PVR 2 WU.  - ACEI cough.  - Echo (11/17): EF 30-35%, normal RV size and systolic function, severe MR with restriction of posterior leaflet.   - Echo  (8/19): EF 30% with regional wall motion abnormalities, normal RV size and systolic function, moderate MR.  - Echo (12/20): EF 30%, LAD wall motion abnormalities, normal RV, PASP 35, moderate functional MR.  3. Atrial fibrillation: Paroxysmal.  Noted 7/17 admission peri-MI, also noted again 10/17 admission. She did not tolerate amiodarone due to nausea/anorexia.  4. Mitral regurgitation: Moderate to severe on 10/17 echo, incomplete coaptation.  ?Etiology, her MI (anterior) does not typically lead to ischemic MR. - Echo in 11/17 with severe MR, posterior leaflet restricted.  - TEE (8/18): EF 30%, moderately dilated LV with akinetic septal and anterior walls, moderate functional MR, normal RV size and systolic function.  - Echo (8/19): Moderate MR 5. Hyperlipidemia: Myalgias with atorvastatin.  6. PVCs: Holter 12/18 with 41% PVCs.  - PVC ablation in 2/19 with recurrence of PVCs.   SH: Married, lives in [place], works for the [employer], no smoking or ETOH.   Family History  Problem Relation Age of Onset  . Heart attack Father   . Arrhythmia Mother   . Colon cancer Neg Hx   . Colon polyps Neg Hx    ROS: All systems reviewed and negative except as per HPI.   Current Outpatient Medications on File Prior to Encounter  Medication Sig Dispense Refill  . buPROPion (WELLBUTRIN XL) 300 MG 24 hr tablet Take 300 mg by mouth daily.    . Calcium Carb-Cholecalciferol (CALCIUM + D3 PO) Take 1 tablet by mouth every morning.    . Coenzyme Q10 (COQ10) 200 MG CAPS Take 200 mg by mouth daily.    . fexofenadine (ALLEGRA) 180 MG tablet Take 180 mg by mouth daily.    . nitroGLYCERIN (NITROSTAT) 0.4 MG SL tablet Place 1 tablet (0.4 mg total) under the tongue every 5 (five) minutes as needed for chest pain. 25 tablet 3  . pantoprazole (PROTONIX) 40 MG tablet Take 1 tablet (40 mg total) by mouth daily. Please keep upcoming appt with Dr. Clifton James in October before anymore refills. Thank you 90 tablet 3  . potassium  chloride SA (KLOR-CON) 20 MEQ tablet SMARTSIG:2 Tablet(s) By Mouth Every Other Day    . rivaroxaban (XARELTO) 20 MG TABS tablet TAKE 1 TABLET EVERY DAY WITH SUPPER 90 tablet 1  . rosuvastatin (CRESTOR) 10 MG tablet Take 1 tablet (10 mg total) by mouth daily. 90 tablet 3  . sotalol (BETAPACE) 160 MG tablet TAKE 1 TABLET BY MOUTH TWICE DAILY 60 tablet 6  . SYNTHROID 112 MCG tablet Take 112 mcg by mouth every morning.   5  . traZODone (DESYREL) 50 MG tablet Take 0.5 tablets (25 mg total) by mouth at bedtime as needed for sleep. 30 tablet 0  . triamcinolone (NASACORT ALLERGY 24HR) 55 MCG/ACT AERO nasal inhaler Place 2 sprays into the nose daily.      Current Facility-Administered Medications on File Prior to Encounter  Medication Dose  Route Frequency Provider Last Rate Last Admin  . bivalirudin (ANGIOMAX) 250 mg in sodium chloride 0.9 % 50 mL (5 mg/mL) infusion    Continuous PRN Burnell Blanks, MD   Stopped at 09/20/15 [phone]  . bivalirudin (ANGIOMAX) BOLUS via infusion    PRN Burnell Blanks, MD   54.45 mg at 09/20/15 2951  . fentaNYL (SUBLIMAZE) injection    PRN Burnell Blanks, MD   25 mcg at 09/20/15 0651  . heparin 1,500 mL    PRN Burnell Blanks, MD   Given at 09/20/15 [phone]  . heparin infusion 2 units/mL in 0.9 % sodium chloride    Continuous PRN Burnell Blanks, MD   1,500 mL at 09/20/15 0733  . iopamidol (ISOVUE-370) 76 % injection    PRN Burnell Blanks, MD   165 mL at 09/20/15 0733  . lidocaine (PF) (XYLOCAINE) 1 % injection    PRN Burnell Blanks, MD   2 mL at 09/20/15 6606  . midazolam (VERSED) injection    PRN Burnell Blanks, MD   1 mg at 09/20/15 0651  . nitroGLYCERIN 1 mg/10 ml (100 mcg/ml) - IR/CATH LAB    PRN Burnell Blanks, MD   200 mcg at 09/20/15 3016  . Radial Cocktail/Verapamil only    PRN Burnell Blanks, MD   10 mL at 09/20/15 0629  . ticagrelor (BRILINTA) tablet    PRN Burnell Blanks, MD    180 mg at 09/20/15 [phone]  . tirofiban (AGGRASTAT) bolus via infusion    PRN Burnell Blanks, MD   1,815 mcg at 09/20/15 [phone]  . tirofiban (AGGRASTAT) infusion 50 mcg/mL 100 mL    Continuous PRN Burnell Blanks, MD 13.1 mL/hr at 09/20/15 0651 0.15 mcg/kg/min at 09/20/15 0651    BP (!) 82/54   Pulse 60   Wt 66.8 kg (147 lb 3.2 oz)   SpO2 100%   BMI 21.74 kg/m  General: NAD Neck: No JVD, no thyromegaly or thyroid nodule.  Lungs: Clear to auscultation bilaterally with normal respiratory effort. CV: Nondisplaced PMI.  Heart regular S1/S2, no S3/S4, no murmur.  No peripheral edema.  No carotid bruit.  Normal pedal pulses.  Abdomen: Soft, nontender, no hepatosplenomegaly, no distention.  Skin: Intact without lesions or rashes.  Neurologic: Alert and oriented x 3.  Psych: Normal affect. Extremities: No clubbing or cyanosis.  HEENT: Normal.   Assessment/Plan: 1. CAD: S/p anterior MI in 7/17 with DES to RCA.  Cath in 10/19 with no obstructive CAD.  No further chest pain.  - She is off Plavix now and taking only Xarelto 20 mg daily.  - She is tolerating Crestor without myalgias.  Good lipids in 9/20.       2. Chronic systolic CHF: Ischemic cardiomyopathy.  EF 25% on echo in 10/17, 30-35% on echo in 11/17, 30% on TEE in 8/18. She has extensive LAD-territory scar. She has a [employer], political party ICD.  Echo in 12/20 showed that EF remains 30% with LAD-territory scar.  She is not volume overloaded on exam. NYHA class II symptoms. She is lightheaded today with low BP, runs low at baseline and now with suspected GI bleed. - Off Coreg with low BP and addition of sotalol.   - Decrease losartan to 12.5 mg qhs.   - She did not tolerate dapagliflozin due to yeast infections.  She does not need to restart Lasix (can use prn).     - Decrease spironolactone to 12.5  mg daily.  - QRS does not appear to be wide enough that she would have benefit from CRT.  - BMET today.  3. Mitral regurgitation: Moderate to  severe on 10/17 echo, severe on 11/17 echo.  I did a TEE in 8/18.  This showed moderate MR that appeared to be functional.  No indication for surgery or Mitraclip. Echo 12/20 showed moderate MR, functional.  4. Atrial fibrillation: Unable to tolerate amiodarone due to GI side effects.  She is in NSR today on Xarelto 20 mg daily and sotalol. QTc interval is acceptable on sotalol on today's ECG.   - She is on Xarelto, to see GI this week.  She can hold Xarelto if needed for GI bleeding, may do better eventually on apixaban given GI bleeding.   5. PVCs: Very frequent on 12/18 holter (41% beats), she has had a PVC ablation in 2/19. Due to recurrence of PVCs post-ablation, sotalol was increased to 160 mg bid. She does not seem to have many PVCs.  - Continue sotalol, QTc ok on ECG today.  6. Hyperlipidemia: Tolerating Crestor, good lipids in 9/20.  7. Fe deficiency anemia: Associated with dark stool/melena.  Hgb down to 8, has had IV Fe.  She will see GI this week and will likely need scopes.  She can hold Xarelto if needed. As above, with GI bleeding, transition to Eliquis eventually may be beneficial.  - CBC today.   Followup in 3-4 weeks with PA/NP.   Marca Ancona 05/21/2019

## 2019-05-23 ENCOUNTER — Other Ambulatory Visit: Payer: Self-pay

## 2019-05-23 ENCOUNTER — Encounter: Payer: Self-pay | Admitting: Gastroenterology

## 2019-05-23 ENCOUNTER — Ambulatory Visit: Payer: BC Managed Care – PPO | Admitting: Gastroenterology

## 2019-05-23 DIAGNOSIS — D509 Iron deficiency anemia, unspecified: Secondary | ICD-10-CM | POA: Insufficient documentation

## 2019-05-23 DIAGNOSIS — R110 Nausea: Secondary | ICD-10-CM | POA: Diagnosis not present

## 2019-05-23 DIAGNOSIS — K219 Gastro-esophageal reflux disease without esophagitis: Secondary | ICD-10-CM | POA: Diagnosis not present

## 2019-05-23 MED ORDER — PANTOPRAZOLE SODIUM 40 MG PO TBEC: 40.0000 mg | DELAYED_RELEASE_TABLET | Freq: Two times a day (BID) | ORAL | 1 refills | Status: DC

## 2019-05-23 NOTE — Assessment & Plan Note (Signed)
54 y/o female on chronic pantoprazole 40mg  daily for the past 3+ years presenting with new symptoms of postprandial coughing, throat clearing, hiccups and also with nausea (before/after meals). Denies dysphagia. No heartburn. ?LPR symptoms (laryngopharyngeal reflux).   Interestingly she has also been found to have progressive IDA over the past two years.  Colonoscopy up-to-date.  No prior upper endoscopy.  She is chronically anticoagulated with Xarelto since 2017.  Suspect chronic occult GI bleeding.  Given upper GI symptoms, IDA, would recommend upper endoscopy in the near future (propofol due to polypharmacy).  We will hold Xarelto 48 hours prior to procedure. She has ICD implanted.   I have discussed the risks, alternatives, benefits with regards to but not limited to the risk of reaction to medication, bleeding, infection, perforation and the patient is agreeable to proceed. Written consent to be obtained.  We will increase her pantoprazole to 40 mg twice daily before meals.  She has follow-up with PCP in a few weeks, can have repeat labs at that time.  However she feels like she has progressive weakness in the interim, we can update anemia labs at any time.  She is aware to let 2018 know.

## 2019-05-23 NOTE — H&P (View-Only) (Signed)
Primary Care Physician: Benita Stabile, MD  Primary Gastroenterologist:  Roetta Sessions, MD   Chief Complaint  Patient presents with  . Nausea    in the mornings, happens even before eating  . coughing/burping    after eating     HPI: Catherine Mays is a 54 y.o. female here for further evaluation of coughing, burping after meals.  Patient was last seen in 2019 at time of screening colonoscopy.  She had diverticulosis, internal hemorrhoids but otherwise unremarkable.  Patient has a history of CAD status post MI, A. fib, ICD placement, chronic kidney disease stage III, chronic systolic CHF.  She was in the hospital back in October 2019 for chest pain and underwent cardiac cath.  During that admission, day 2 noted that her hemoglobin was 11.8.  This was rechecked in February 2020 noted to be 11.6, September 2020Hgb 10. First part of March 2021 her hemoglobin was 8.2, MCV 77, iron 20, iron saturations 5%, TIBC 427, B12 normal at 568. Last week her hemoglobin was 9.3.  She is scheduled please receive 2nd of 2 iron infusions, scheduled by PCP. Also on iron pills.   Patient is chronically anticoagulated with Xarelto.  Complains of several month history of nausea, burping, throat clear and cough. Postprandial coughing, throat clearing, coughing occurring for several months. Only associated with meals. She has nausea prior to meals. BMs are regular. Used to be brown and even before starting iron she notes stool color has been very dark but not necessarily black. No red tint to water or fresh blood. No constipation, diarrhea. Before heart attack in 2017 was on reflux medicine. Has been on pantoprazole since 08/2015, once daily. No heartburn. No dysphagia. No vomiting. No abdominal pain. Lots of noise in stomach. No weight loss.   No menstrual cycles since 2017. No nosebleeds. Xerelto since 2017  She has ICD implanted.        Current Outpatient Medications  Medication Sig Dispense Refill  .  buPROPion (WELLBUTRIN XL) 300 MG 24 hr tablet Take 300 mg by mouth daily.    . Calcium Carb-Cholecalciferol (CALCIUM + D3 PO) Take 1 tablet by mouth every morning.    . Coenzyme Q10 (COQ10) 200 MG CAPS Take 200 mg by mouth daily.    . ferrous sulfate 324 MG TBEC Take 324 mg by mouth daily.    . fexofenadine (ALLEGRA) 180 MG tablet Take 180 mg by mouth daily.    . furosemide (LASIX) 20 MG tablet Take 1 tablet (20 mg total) by mouth daily as needed. 30 tablet 4  . losartan (COZAAR) 25 MG tablet Take 0.5 tablets (12.5 mg total) by mouth at bedtime. 15 tablet 3  . nitroGLYCERIN (NITROSTAT) 0.4 MG SL tablet Place 1 tablet (0.4 mg total) under the tongue every 5 (five) minutes as needed for chest pain. 25 tablet 3  . pantoprazole (PROTONIX) 40 MG tablet Take 1 tablet (40 mg total) by mouth daily. Please keep upcoming appt with Dr. Clifton James in October before anymore refills. Thank you 90 tablet 3  . potassium chloride SA (KLOR-CON) 20 MEQ tablet SMARTSIG:2 Tablet(s) By Mouth Every Other Day    . rivaroxaban (XARELTO) 20 MG TABS tablet TAKE 1 TABLET EVERY DAY WITH SUPPER 90 tablet 1  . rosuvastatin (CRESTOR) 10 MG tablet Take 1 tablet (10 mg total) by mouth daily. 90 tablet 3  . sotalol (BETAPACE) 160 MG tablet TAKE 1 TABLET BY MOUTH TWICE DAILY 60 tablet 6  .  spironolactone (ALDACTONE) 25 MG tablet Take 0.5 tablets (12.5 mg total) by mouth daily. 15 tablet 3  . SYNTHROID 112 MCG tablet Take 112 mcg by mouth every morning.   5  . traZODone (DESYREL) 50 MG tablet Take 0.5 tablets (25 mg total) by mouth at bedtime as needed for sleep. 30 tablet 0  . triamcinolone (NASACORT ALLERGY 24HR) 55 MCG/ACT AERO nasal inhaler Place 2 sprays into the nose daily.      No current facility-administered medications for this visit.   Facility-Administered Medications Ordered in Other Visits  Medication Dose Route Frequency Provider Last Rate Last Admin  . bivalirudin (ANGIOMAX) 250 mg in sodium chloride 0.9 % 50 mL (5  mg/mL) infusion    Continuous PRN Burnell Blanks, MD   Stopped at 09/20/15 [phone]  . bivalirudin (ANGIOMAX) BOLUS via infusion    PRN Burnell Blanks, MD   54.45 mg at 09/20/15 0160  . fentaNYL (SUBLIMAZE) injection    PRN Burnell Blanks, MD   25 mcg at 09/20/15 0651  . heparin 1,500 mL    PRN Burnell Blanks, MD   Given at 09/20/15 [phone]  . heparin infusion 2 units/mL in 0.9 % sodium chloride    Continuous PRN Burnell Blanks, MD   1,500 mL at 09/20/15 0733  . iopamidol (ISOVUE-370) 76 % injection    PRN Burnell Blanks, MD   165 mL at 09/20/15 0733  . lidocaine (PF) (XYLOCAINE) 1 % injection    PRN Burnell Blanks, MD   2 mL at 09/20/15 2355  . midazolam (VERSED) injection    PRN Burnell Blanks, MD   1 mg at 09/20/15 0651  . nitroGLYCERIN 1 mg/10 ml (100 mcg/ml) - IR/CATH LAB    PRN Burnell Blanks, MD   200 mcg at 09/20/15 7322  . Radial Cocktail/Verapamil only    PRN Burnell Blanks, MD   10 mL at 09/20/15 0629  . ticagrelor (BRILINTA) tablet    PRN Burnell Blanks, MD   180 mg at 09/20/15 [phone]  . tirofiban (AGGRASTAT) bolus via infusion    PRN Burnell Blanks, MD   1,815 mcg at 09/20/15 [phone]  . tirofiban (AGGRASTAT) infusion 50 mcg/mL 100 mL    Continuous PRN Burnell Blanks, MD 13.1 mL/hr at 09/20/15 0651 0.15 mcg/kg/min at 09/20/15 0651    Allergies as of 05/23/2019 - Review Complete 05/23/2019  Allergen Reaction Noted  . Paxil [paroxetine] Palpitations 11/23/2015  . Morphine and related Nausea And Vomiting 11/23/2015  . Percocet [oxycodone-acetaminophen] Nausea And Vomiting 11/23/2015  . Cefdinir Nausea Only 11/23/2015  . Zolpidem Other (See Comments) 11/23/2015   Past Medical History:  Diagnosis Date  . AICD (automatic cardioverter/defibrillator) present    St. Jude  . CAD in native artery    a. late recognition of presentation of STEMI 08/2015 s/p DES to LAD, mild residual  mRCA.  Marland Kitchen Chronic systolic CHF (congestive heart failure) ([place])   . CKD (chronic kidney disease), stage III   . Hypotension    a. preventing med titration for HF.  Marland Kitchen Hypothyroidism 09/24/2015  . Ischemic cardiomyopathy   . Myocardial infarction ([place]) 08/2015  . PAF (paroxysmal atrial fibrillation) ([place]) 09/24/2015   a. dx at time of STEMI 08/2015.  . Pre-diabetes   . Presence of permanent cardiac pacemaker    patient has ICD  . PVC's (premature ventricular contractions)    Past Surgical History:  Procedure Laterality Date  . CARDIAC  CATHETERIZATION N/A 09/20/2015   Procedure: Left Heart Cath and Coronary Angiography;  Surgeon: Christopher D McAlhany, MD;  Location: [facility];  Service: Cardiovascular;  Laterality: N/A;  . CARDIAC CATHETERIZATION N/A 09/20/2015   Procedure: Coronary Stent Intervention;  Surgeon: Christopher D McAlhany, MD;  Location: [facility];  Service: Cardiovascular;  Laterality: N/A;  . CARDIAC CATHETERIZATION N/A 09/21/2015   Procedure: Left Heart Cath and Coronary Angiography;  Surgeon: Michael Cooper, MD;  Location: [facility];  Service: Cardiovascular;  Laterality: N/A;  . CARDIAC CATHETERIZATION N/A 11/26/2015   Procedure: Right Heart Cath;  Surgeon: Dalton S McLean, MD;  Location: [facility];  Service: Cardiovascular;  Laterality: N/A;  . COLONOSCOPY WITH PROPOFOL N/A 11/23/2017   Dr. Rourk: Diverticulosis, internal hemorrhoids, next colonoscopy in 10 years  . CORONARY STENT PLACEMENT  09/20/2015  . EP IMPLANTABLE DEVICE N/A 01/23/2016   Procedure: ICD Implant;  Surgeon: Will Martin Camnitz, MD;  Location: [facility];  Service: Cardiovascular;  Laterality: N/A;  . LEFT HEART CATH AND CORONARY ANGIOGRAPHY N/A 12/18/2017   Procedure: LEFT HEART CATH AND CORONARY ANGIOGRAPHY;  Surgeon: McLean, Dalton S, MD;  Location: [facility];  Service: Cardiovascular;  Laterality: N/A;  . PVC ABLATION N/A 04/02/2017   Procedure: PVC  ABLATION;  Surgeon: Camnitz, Will Martin, MD;  Location: [facility];  Service: Cardiovascular;  Laterality: N/A;  . TEE WITHOUT CARDIOVERSION N/A 10/08/2016   Procedure: TRANSESOPHAGEAL ECHOCARDIOGRAM (TEE);  Surgeon: McLean, Dalton S, MD;  Location: [facility];  Service: Cardiovascular;  Laterality: N/A;  . THYROIDECTOMY     Family History  Problem Relation Age of Onset  . Heart attack Father   . Arrhythmia Mother   . Colon cancer Neg Hx   . Colon polyps Neg Hx    Social History   Tobacco Use  . Smoking status: Never Smoker  . Smokeless tobacco: Never Used  Substance Use Topics  . Alcohol use: No  . Drug use: No     ROS:  General: Negative for anorexia, weight loss, fever, chills, fatigue,+ weakness. ENT: Negative for hoarseness, difficulty swallowing , nasal congestion. CV: Negative for chest pain, angina, palpitations, dyspnea on exertion, peripheral edema.  Respiratory: Negative for dyspnea at rest, dyspnea on exertion, cough, sputum, wheezing.  GI: See history of present illness. GU:  Negative for dysuria, hematuria, urinary incontinence, urinary frequency, nocturnal urination.  Endo: Negative for unusual weight change.    Physical Examination:   BP 96/60   Pulse (!) 54   Temp (!) 97 F (36.1 C) (Oral)   Ht 5' 9" (1.753 m)   Wt 148 lb 3.2 oz (67.2 kg)   BMI 21.89 kg/m   General: Well-nourished, well-developed in no acute distress.  Eyes: No icterus. Mouth: masked Lungs: Clear to auscultation bilaterally.  Heart: Regular rate and rhythm, no murmurs rubs or gallops.  Abdomen: Bowel sounds are normal, nontender, nondistended, no hepatosplenomegaly or masses, no abdominal bruits or hernia , no rebound or guarding.   Extremities: No lower extremity edema. No clubbing or deformities. Neuro: Alert and oriented x 4   Skin: Warm and dry, no jaundice.   Psych: Alert and cooperative, normal mood and affect.  Labs:  Lab Results  Component Value Date   WBC  4.6 05/19/2019   HGB 9.3 (L) 05/19/2019   HCT 32.3 (L) 05/19/2019   MCV 82.0 05/19/2019   PLT 251 05/19/2019   Lab Results  Component Value   Date   CREATININE 1.22 (H) 05/19/2019   BUN 21 (H) 05/19/2019   NA 139 05/19/2019   K 4.1 05/19/2019   CL 106 05/19/2019   CO2 25 05/19/2019   Lab Results  Component Value Date   ALT 15 12/17/2017   AST 23 12/17/2017   ALKPHOS 64 12/17/2017   BILITOT 1.2 12/17/2017     Imaging Studies: No results found.

## 2019-05-23 NOTE — Patient Instructions (Addendum)
1. Upper endoscopy as scheduled. See separate instructions.  2. Increase pantoprazole to twice daily before breakfast and before evening meal.  3. Continue with iron infusion as planned and follow up with PCP as scheduled. If you feel like fatigue or weakness is worse, we can update your anemia labs. Just let me know.  4. If you have progressive weakness, black or bloody stools you need to go to nearest ER.

## 2019-05-23 NOTE — Progress Notes (Signed)
Primary Care Physician: Benita Stabile, MD  Primary Gastroenterologist:  Roetta Sessions, MD   Chief Complaint  Patient presents with  . Nausea    in the mornings, happens even before eating  . coughing/burping    after eating     HPI: Catherine Mays is a 54 y.o. female here for further evaluation of coughing, burping after meals.  Patient was last seen in 2019 at time of screening colonoscopy.  She had diverticulosis, internal hemorrhoids but otherwise unremarkable.  Patient has a history of CAD status post MI, A. fib, ICD placement, chronic kidney disease stage III, chronic systolic CHF.  She was in the hospital back in October 2019 for chest pain and underwent cardiac cath.  During that admission, day 2 noted that her hemoglobin was 11.8.  This was rechecked in February 2020 noted to be 11.6, September 2020Hgb 10. First part of March 2021 her hemoglobin was 8.2, MCV 77, iron 20, iron saturations 5%, TIBC 427, B12 normal at 568. Last week her hemoglobin was 9.3.  She is scheduled please receive 2nd of 2 iron infusions, scheduled by PCP. Also on iron pills.   Patient is chronically anticoagulated with Xarelto.  Complains of several month history of nausea, burping, throat clear and cough. Postprandial coughing, throat clearing, coughing occurring for several months. Only associated with meals. She has nausea prior to meals. BMs are regular. Used to be brown and even before starting iron she notes stool color has been very dark but not necessarily black. No red tint to water or fresh blood. No constipation, diarrhea. Before heart attack in 2017 was on reflux medicine. Has been on pantoprazole since 08/2015, once daily. No heartburn. No dysphagia. No vomiting. No abdominal pain. Lots of noise in stomach. No weight loss.   No menstrual cycles since 2017. No nosebleeds. Xerelto since 2017  She has ICD implanted.        Current Outpatient Medications  Medication Sig Dispense Refill  .  buPROPion (WELLBUTRIN XL) 300 MG 24 hr tablet Take 300 mg by mouth daily.    . Calcium Carb-Cholecalciferol (CALCIUM + D3 PO) Take 1 tablet by mouth every morning.    . Coenzyme Q10 (COQ10) 200 MG CAPS Take 200 mg by mouth daily.    . ferrous sulfate 324 MG TBEC Take 324 mg by mouth daily.    . fexofenadine (ALLEGRA) 180 MG tablet Take 180 mg by mouth daily.    . furosemide (LASIX) 20 MG tablet Take 1 tablet (20 mg total) by mouth daily as needed. 30 tablet 4  . losartan (COZAAR) 25 MG tablet Take 0.5 tablets (12.5 mg total) by mouth at bedtime. 15 tablet 3  . nitroGLYCERIN (NITROSTAT) 0.4 MG SL tablet Place 1 tablet (0.4 mg total) under the tongue every 5 (five) minutes as needed for chest pain. 25 tablet 3  . pantoprazole (PROTONIX) 40 MG tablet Take 1 tablet (40 mg total) by mouth daily. Please keep upcoming appt with Dr. Clifton James in October before anymore refills. Thank you 90 tablet 3  . potassium chloride SA (KLOR-CON) 20 MEQ tablet SMARTSIG:2 Tablet(s) By Mouth Every Other Day    . rivaroxaban (XARELTO) 20 MG TABS tablet TAKE 1 TABLET EVERY DAY WITH SUPPER 90 tablet 1  . rosuvastatin (CRESTOR) 10 MG tablet Take 1 tablet (10 mg total) by mouth daily. 90 tablet 3  . sotalol (BETAPACE) 160 MG tablet TAKE 1 TABLET BY MOUTH TWICE DAILY 60 tablet 6  .  spironolactone (ALDACTONE) 25 MG tablet Take 0.5 tablets (12.5 mg total) by mouth daily. 15 tablet 3  . SYNTHROID 112 MCG tablet Take 112 mcg by mouth every morning.   5  . traZODone (DESYREL) 50 MG tablet Take 0.5 tablets (25 mg total) by mouth at bedtime as needed for sleep. 30 tablet 0  . triamcinolone (NASACORT ALLERGY 24HR) 55 MCG/ACT AERO nasal inhaler Place 2 sprays into the nose daily.      No current facility-administered medications for this visit.   Facility-Administered Medications Ordered in Other Visits  Medication Dose Route Frequency Provider Last Rate Last Admin  . bivalirudin (ANGIOMAX) 250 mg in sodium chloride 0.9 % 50 mL (5  mg/mL) infusion    Continuous PRN Burnell Blanks, MD   Stopped at 09/20/15 [phone]  . bivalirudin (ANGIOMAX) BOLUS via infusion    PRN Burnell Blanks, MD   54.45 mg at 09/20/15 0160  . fentaNYL (SUBLIMAZE) injection    PRN Burnell Blanks, MD   25 mcg at 09/20/15 0651  . heparin 1,500 mL    PRN Burnell Blanks, MD   Given at 09/20/15 [phone]  . heparin infusion 2 units/mL in 0.9 % sodium chloride    Continuous PRN Burnell Blanks, MD   1,500 mL at 09/20/15 0733  . iopamidol (ISOVUE-370) 76 % injection    PRN Burnell Blanks, MD   165 mL at 09/20/15 0733  . lidocaine (PF) (XYLOCAINE) 1 % injection    PRN Burnell Blanks, MD   2 mL at 09/20/15 2355  . midazolam (VERSED) injection    PRN Burnell Blanks, MD   1 mg at 09/20/15 0651  . nitroGLYCERIN 1 mg/10 ml (100 mcg/ml) - IR/CATH LAB    PRN Burnell Blanks, MD   200 mcg at 09/20/15 7322  . Radial Cocktail/Verapamil only    PRN Burnell Blanks, MD   10 mL at 09/20/15 0629  . ticagrelor (BRILINTA) tablet    PRN Burnell Blanks, MD   180 mg at 09/20/15 [phone]  . tirofiban (AGGRASTAT) bolus via infusion    PRN Burnell Blanks, MD   1,815 mcg at 09/20/15 [phone]  . tirofiban (AGGRASTAT) infusion 50 mcg/mL 100 mL    Continuous PRN Burnell Blanks, MD 13.1 mL/hr at 09/20/15 0651 0.15 mcg/kg/min at 09/20/15 0651    Allergies as of 05/23/2019 - Review Complete 05/23/2019  Allergen Reaction Noted  . Paxil [paroxetine] Palpitations 11/23/2015  . Morphine and related Nausea And Vomiting 11/23/2015  . Percocet [oxycodone-acetaminophen] Nausea And Vomiting 11/23/2015  . Cefdinir Nausea Only 11/23/2015  . Zolpidem Other (See Comments) 11/23/2015   Past Medical History:  Diagnosis Date  . AICD (automatic cardioverter/defibrillator) present    St. Jude  . CAD in native artery    a. late recognition of presentation of STEMI 08/2015 s/p DES to LAD, mild residual  mRCA.  Marland Kitchen Chronic systolic CHF (congestive heart failure) ([place])   . CKD (chronic kidney disease), stage III   . Hypotension    a. preventing med titration for HF.  Marland Kitchen Hypothyroidism 09/24/2015  . Ischemic cardiomyopathy   . Myocardial infarction ([place]) 08/2015  . PAF (paroxysmal atrial fibrillation) ([place]) 09/24/2015   a. dx at time of STEMI 08/2015.  . Pre-diabetes   . Presence of permanent cardiac pacemaker    patient has ICD  . PVC's (premature ventricular contractions)    Past Surgical History:  Procedure Laterality Date  . CARDIAC  CATHETERIZATION N/A 09/20/2015   Procedure: Left Heart Cath and Coronary Angiography;  Surgeon: Kathleene Hazel, MD;  Location: [facility] INVASIVE CV LAB;  Service: Cardiovascular;  Laterality: N/A;  . CARDIAC CATHETERIZATION N/A 09/20/2015   Procedure: Coronary Stent Intervention;  Surgeon: Kathleene Hazel, MD;  Location: [facility];  Service: Cardiovascular;  Laterality: N/A;  . CARDIAC CATHETERIZATION N/A 09/21/2015   Procedure: Left Heart Cath and Coronary Angiography;  Surgeon: Tonny Bollman, MD;  Location: [facility] INVASIVE CV LAB;  Service: Cardiovascular;  Laterality: N/A;  . CARDIAC CATHETERIZATION N/A 11/26/2015   Procedure: Right Heart Cath;  Surgeon: [name], MD;  Location: [facility] INVASIVE CV LAB;  Service: Cardiovascular;  Laterality: N/A;  . COLONOSCOPY WITH PROPOFOL N/A 11/23/2017   Dr. Jena Gauss: Diverticulosis, internal hemorrhoids, next colonoscopy in 10 years  . CORONARY STENT PLACEMENT  09/20/2015  . EP IMPLANTABLE DEVICE N/A 01/23/2016   Procedure: ICD Implant;  Surgeon: Will Jorja Loa, MD;  Location: [facility];  Service: Cardiovascular;  Laterality: N/A;  . LEFT HEART CATH AND CORONARY ANGIOGRAPHY N/A 12/18/2017   Procedure: LEFT HEART CATH AND CORONARY ANGIOGRAPHY;  Surgeon: [name], MD;  Location: [facility] INVASIVE CV LAB;  Service: Cardiovascular;  Laterality: N/A;  . PVC ABLATION N/A 04/02/2017   Procedure: PVC  ABLATION;  Surgeon: Regan Lemming, MD;  Location: [facility];  Service: Cardiovascular;  Laterality: N/A;  . TEE WITHOUT CARDIOVERSION N/A 10/08/2016   Procedure: TRANSESOPHAGEAL ECHOCARDIOGRAM (TEE);  Surgeon: [name], MD;  Location: [facility] ENDOSCOPY;  Service: Cardiovascular;  Laterality: N/A;  . THYROIDECTOMY     Family History  Problem Relation Age of Onset  . Heart attack Father   . Arrhythmia Mother   . Colon cancer Neg Hx   . Colon polyps Neg Hx    Social History   Tobacco Use  . Smoking status: Never Smoker  . Smokeless tobacco: Never Used  Substance Use Topics  . Alcohol use: No  . Drug use: No     ROS:  General: Negative for anorexia, weight loss, fever, chills, fatigue,+ weakness. ENT: Negative for hoarseness, difficulty swallowing , nasal congestion. CV: Negative for chest pain, angina, palpitations, dyspnea on exertion, peripheral edema.  Respiratory: Negative for dyspnea at rest, dyspnea on exertion, cough, sputum, wheezing.  GI: See history of present illness. GU:  Negative for dysuria, hematuria, urinary incontinence, urinary frequency, nocturnal urination.  Endo: Negative for unusual weight change.    Physical Examination:   BP 96/60   Pulse (!) 54   Temp (!) 97 F (36.1 C) (Oral)   Ht 5\' 9"  (1.753 m)   Wt 148 lb 3.2 oz (67.2 kg)   BMI 21.89 kg/m   General: Well-nourished, well-developed in no acute distress.  Eyes: No icterus. Mouth: masked Lungs: Clear to auscultation bilaterally.  Heart: Regular rate and rhythm, no murmurs rubs or gallops.  Abdomen: Bowel sounds are normal, nontender, nondistended, no hepatosplenomegaly or masses, no abdominal bruits or hernia , no rebound or guarding.   Extremities: No lower extremity edema. No clubbing or deformities. Neuro: Alert and oriented x 4   Skin: Warm and dry, no jaundice.   Psych: Alert and cooperative, normal mood and affect.  Labs:  Lab Results  Component Value Date   WBC  4.6 05/19/2019   HGB 9.3 (L) 05/19/2019   HCT 32.3 (L) 05/19/2019   MCV 82.0 05/19/2019   PLT 251 05/19/2019   Lab Results  Component Value  Date   CREATININE 1.22 (H) 05/19/2019   BUN 21 (H) 05/19/2019   NA 139 05/19/2019   K 4.1 05/19/2019   CL 106 05/19/2019   CO2 25 05/19/2019   Lab Results  Component Value Date   ALT 15 12/17/2017   AST 23 12/17/2017   ALKPHOS 64 12/17/2017   BILITOT 1.2 12/17/2017     Imaging Studies: No results found.

## 2019-05-24 ENCOUNTER — Telehealth: Payer: Self-pay

## 2019-05-24 ENCOUNTER — Encounter (HOSPITAL_COMMUNITY)
Admission: RE | Admit: 2019-05-24 | Discharge: 2019-05-24 | Disposition: A | Discharge status: Home / Self Care | Payer: BC Managed Care – PPO | Source: Ambulatory Visit | Attending: Internal Medicine | Admitting: Internal Medicine

## 2019-05-24 ENCOUNTER — Other Ambulatory Visit: Payer: Self-pay

## 2019-05-24 ENCOUNTER — Encounter (HOSPITAL_COMMUNITY): Payer: Self-pay

## 2019-05-24 DIAGNOSIS — D509 Iron deficiency anemia, unspecified: Secondary | ICD-10-CM | POA: Diagnosis not present

## 2019-05-24 MED ORDER — SODIUM CHLORIDE 0.9 % IV SOLN
510.0000 mg | Freq: Once | INTRAVENOUS | Status: AC
  Administered 2019-05-24: 510 mg via INTRAVENOUS
  Filled 2019-05-24: qty 17

## 2019-05-24 MED ORDER — SODIUM CHLORIDE 0.9 % IV SOLN: Freq: Once | INTRAVENOUS | Status: AC

## 2019-05-24 NOTE — Telephone Encounter (Signed)
PA for EGD submitted via [employer] website yesterday. Case in progress.  Called [employer] [phone]), spoke to [place]. Case approved. Order ID: [id number], valid 05/23/19-07/21/19.

## 2019-06-09 ENCOUNTER — Ambulatory Visit (HOSPITAL_COMMUNITY)
Admission: RE | Admit: 2019-06-09 | Discharge: 2019-06-09 | Disposition: A | Discharge status: Home / Self Care | Payer: BC Managed Care – PPO | Source: Ambulatory Visit | Attending: Cardiology | Admitting: Cardiology

## 2019-06-09 ENCOUNTER — Other Ambulatory Visit: Payer: Self-pay

## 2019-06-09 VITALS — BP 104/64 | HR 61 | Wt 150.4 lb

## 2019-06-09 DIAGNOSIS — Z95810 Presence of automatic (implantable) cardiac defibrillator: Secondary | ICD-10-CM | POA: Diagnosis not present

## 2019-06-09 DIAGNOSIS — E785 Hyperlipidemia, unspecified: Secondary | ICD-10-CM | POA: Diagnosis not present

## 2019-06-09 DIAGNOSIS — I493 Ventricular premature depolarization: Secondary | ICD-10-CM | POA: Insufficient documentation

## 2019-06-09 DIAGNOSIS — I480 Paroxysmal atrial fibrillation: Secondary | ICD-10-CM | POA: Insufficient documentation

## 2019-06-09 DIAGNOSIS — I340 Nonrheumatic mitral (valve) insufficiency: Secondary | ICD-10-CM | POA: Insufficient documentation

## 2019-06-09 DIAGNOSIS — Z7901 Long term (current) use of anticoagulants: Secondary | ICD-10-CM | POA: Diagnosis not present

## 2019-06-09 DIAGNOSIS — I255 Ischemic cardiomyopathy: Secondary | ICD-10-CM | POA: Insufficient documentation

## 2019-06-09 DIAGNOSIS — Z955 Presence of coronary angioplasty implant and graft: Secondary | ICD-10-CM | POA: Insufficient documentation

## 2019-06-09 DIAGNOSIS — I2510 Atherosclerotic heart disease of native coronary artery without angina pectoris: Secondary | ICD-10-CM | POA: Insufficient documentation

## 2019-06-09 DIAGNOSIS — D509 Iron deficiency anemia, unspecified: Secondary | ICD-10-CM | POA: Diagnosis not present

## 2019-06-09 DIAGNOSIS — Z8249 Family history of ischemic heart disease and other diseases of the circulatory system: Secondary | ICD-10-CM | POA: Insufficient documentation

## 2019-06-09 DIAGNOSIS — Z79899 Other long term (current) drug therapy: Secondary | ICD-10-CM | POA: Diagnosis not present

## 2019-06-09 DIAGNOSIS — I252 Old myocardial infarction: Secondary | ICD-10-CM | POA: Insufficient documentation

## 2019-06-09 DIAGNOSIS — R195 Other fecal abnormalities: Secondary | ICD-10-CM | POA: Insufficient documentation

## 2019-06-09 DIAGNOSIS — I5022 Chronic systolic (congestive) heart failure: Secondary | ICD-10-CM | POA: Insufficient documentation

## 2019-06-09 LAB — CBC
HCT: 34 % — ABNORMAL LOW (ref 36.0–46.0)
Hemoglobin: 10.3 g/dL — ABNORMAL LOW (ref 12.0–15.0)
MCH: 26.5 pg (ref 26.0–34.0)
MCHC: 30.3 g/dL (ref 30.0–36.0)
MCV: 87.6 fL (ref 80.0–100.0)
Platelets: 155 10*3/uL (ref 150–400)
RBC: 3.88 MIL/uL (ref 3.87–5.11)
WBC: 4.3 10*3/uL (ref 4.0–10.5)
nRBC: 0 % (ref 0.0–0.2)

## 2019-06-09 LAB — BASIC METABOLIC PANEL
Anion gap: 8 (ref 5–15)
BUN: 25 mg/dL — ABNORMAL HIGH (ref 6–20)
CO2: 25 mmol/L (ref 22–32)
Calcium: 8.8 mg/dL — ABNORMAL LOW (ref 8.9–10.3)
Chloride: 105 mmol/L (ref 98–111)
Creatinine, Ser: 1.11 mg/dL — ABNORMAL HIGH (ref 0.44–1.00)
GFR calc Af Amer: >60 mL/min (ref >60)
GFR calc non Af Amer: 57 mL/min — ABNORMAL LOW (ref >60)
Glucose, Bld: 99 mg/dL (ref 70–99)
Potassium: 4.2 mmol/L (ref 3.5–5.1)
Sodium: 138 mmol/L (ref 135–145)

## 2019-06-09 NOTE — Patient Instructions (Signed)
Labs done today, your results will be available in MyChart, we will contact you for abnormal readings.  Your physician recommends that you schedule a follow-up appointment in: 6-8 weeks  If you have any questions or concerns before your next appointment please send [place] a message through [place] or call our office at [phone].  At the [facility], you and your health needs are our priority. As part of our continuing mission to provide you with exceptional heart care, we have created designated Provider Care Teams. These Care Teams include your primary Cardiologist (physician) and Advanced Practice Providers (APPs- Physician Assistants and Nurse Practitioners) who all work together to provide you with the care you need, when you need it.   You may see any of the following providers on your designated Care Team at your next follow up: Marland Kitchen Dr Arvilla Meres . Dr Marca Ancona . Tonye Becket, NP . Robbie Lis, PA . Karle Plumber, PharmD   Please be sure to bring in all your medications bottles to every appointment.

## 2019-06-09 NOTE — Progress Notes (Signed)
PCP: Dr. Nevada Crane HF Cardiology: Dr. Aundra Dubin  Reason for Visit: F/u for chronic systolic heart failure   54 y.o. with history of CAD s/p anterior STEMI with DES to LAD, ischemic cardiomyopathy, mitral regurgitation, and paroxysmal atrial fibrillation presents for followup of CHF and CAD.  She was admitted in 7/17 with anterior STEMI.  She had a late presentation to the cath lab.  Echo in 10/17 showed EF about 25% with LAD-territory wall motion abnormalities.  She had peri-MI atrial fibrillation and was started on Xarelto.  She was put on amiodarone.   After discharge, she continued to feel poorly.  She developed nausea, anorexia, and significant fatigue.  She was admitted for evaluation in 10/17 given concern for low output heart failure.  However, stopping amiodarone essentially resolved her symptoms.  She had RHC showing preserved cardiac output but the PCWP was high due to prominent v-waves, presumably from significant mitral regurgitation.  Of note, she was in atrial fibrillation for a time during this admission.   She had St Jude ICD placed.  She is back at work for the [employer].   TEE in 8/18 to evaluate the mitral valve showed EF 30%, moderate MR (probably functional).    Patient was seen by Dr. Curt Bears and noted to have very frequent PVCs on device interrogation.  Holter in 12/18 showed 41% PVCs.  She was started on sotalol 80 mg bid and titrated up to 120 mg bid.  Coreg was stopped with the addition of sotalol and losartan was decreased to 12.5 mg daily due to low BP.  She had PVC ablation in 2/19 but PVCs recurred. Sotalol was increased to 160 mg bid.   Echo 8/19 showed EF 30% with regional wall motion abnormalities, normal RV size and systolic function, moderate MR.   She was admitted in 10/19 with chest pain.  Cath showed patent LAD stent and no obstructive CAD.   Echo 12/20, EF 30% with LAD wall motion abnormalities, normal RV, moderate MR.   She was started on  Farxiga but discontinued it after developing a chronic yeast infection.   She had recent f/u w/ Dr. Aundra Dubin on 3/25. Was feeling poorly and had been recently found to be anemic with Fe deficiency (hgb down to 8).  She had been started on IV Fe and scheduled to see GI. She had endorsed dizziness and low BP but no syncope/ near syncope. Given soft BP and dizziness, Losartan was reduced to 12.5 mg qhs and spironolactone reduced to 12.5 mg daily. She was also advised to take lasix only PRN.   She presents back to clinic today for f/u. [name]. Feels better. IV Fe has been helping. Continues to have occasional dark stools. Has seen GI and remains on Xarelto for now. Protonix has been increased to 40 mg bid and she is scheduled for an EGD next week. Her SBP is in the low 100s but she reports that is her usual baseline. She denies any further dizziness. She does note that she changed her lasix back to qod dosing, as she started to experience LEE and abdominal fullness. This has has improved since resuming regular dosing, this also correlates w/ impedence curve (dropped below reference line last week, but now above). Euvolemic on exam today. No dyspnea.   ECG (personally reviewed): sinus brady, 56 bpm, QT/QTc 466/449 ms  St Jude ICD interrogation: Stable thoracic impedance, above reference curve, no VT.   Labs (10/17): K 3.9, creatinine 1.24,  hgb 10.3 Labs (11/17): K 4, creatinine 1.27 Labs (12/17): K 4.2, creatinine 1.12, BNP 439 Labs (2/18): K 4.7, creatinine 1.37 Labs (7/18): K 3.9, creatinine 1.14, hgb 11.7 Labs (12/18): K 4.5, creatinine 1.23 Labs (1/19): K 4.4, creatinine 1.14 Labs (2/19): LDL 97 (off atorvastatin), HDL 56 Labs (5/19): K 4.3, creatinine 1.1 Labs (10/19): K 4.2, creatinine 1.2, LDL 70, hgb 11.8 Labs (11/19): K 4.9, creatinine 1.17, hgb 11.8, LDL 68, low TSH  Labs (9/20): K 4.4, creatinine 1.34, LDL 52 Labs (12/20): K 4.6, creatinine 1.19 Labs (3/21): K 4.1, creatinine  1.22, hgb 9.3   PMH:  1. CAD: Anterior STEMI in 7/17 with late presentation to the cath lab. She had DES to proximal LAD.  No significant disease in other vessels.  - LHC (10/19): Patent LAD stent, no obstructive disease.  2. Chronic systolic CHF: Ischemic cardiomyopathy.  - Echo 10/17 with EF 25%, akinesis of the mid-apical anteroseptal and anterior walls, apical dyskinesis, moderate to severe MR with incomplete coaptation.  - RHC (10/17): mean RA 4, PA 59/17 mean 38, mean PCWP 27 with prominent V waves suggesting significant MR, CI 3.06, PVR 2 WU.  - ACEI cough.  - Echo (11/17): EF 30-35%, normal RV size and systolic function, severe MR with restriction of posterior leaflet.   - Echo (8/19): EF 30% with regional wall motion abnormalities, normal RV size and systolic function, moderate MR.  - Echo (12/20): EF 30%, LAD wall motion abnormalities, normal RV, PASP 35, moderate functional MR.  3. Atrial fibrillation: Paroxysmal.  Noted 7/17 admission peri-MI, also noted again 10/17 admission. She did not tolerate amiodarone due to nausea/anorexia.  4. Mitral regurgitation: Moderate to severe on 10/17 echo, incomplete coaptation.  ?Etiology, her MI (anterior) does not typically lead to ischemic MR. - Echo in 11/17 with severe MR, posterior leaflet restricted.  - TEE (8/18): EF 30%, moderately dilated LV with akinetic septal and anterior walls, moderate functional MR, normal RV size and systolic function.  - Echo (8/19): Moderate MR 5. Hyperlipidemia: Myalgias with atorvastatin.  6. PVCs: Holter 12/18 with 41% PVCs.  - PVC ablation in 2/19 with recurrence of PVCs.   SH: Married, lives in [place], works for the [employer], no smoking or ETOH.   Family History  Problem Relation Age of Onset  . Heart attack Father   . Arrhythmia Mother   . Lung cancer Mother        bronchiectasis  . Colon cancer Neg Hx   . Colon polyps Neg Hx    ROS: All systems reviewed and negative except as per HPI.   Current  Outpatient Medications on File Prior to Encounter  Medication Sig Dispense Refill  . buPROPion (WELLBUTRIN XL) 300 MG 24 hr tablet Take 300 mg by mouth daily.    . Calcium Carb-Cholecalciferol (CALCIUM + D3 PO) Take 1 tablet by mouth every morning.    . Coenzyme Q10 (COQ10) 200 MG CAPS Take 200 mg by mouth daily.    . ferrous sulfate 324 MG TBEC Take 324 mg by mouth daily.    . fexofenadine (ALLEGRA) 180 MG tablet Take 180 mg by mouth daily.    . furosemide (LASIX) 20 MG tablet Take 1 tablet (20 mg total) by mouth daily as needed. (Patient taking differently: Take 20 mg by mouth daily as needed for fluid. ) 30 tablet 4  . losartan (COZAAR) 25 MG tablet Take 0.5 tablets (12.5 mg total) by mouth at bedtime. 15 tablet 3  . nitroGLYCERIN (NITROSTAT)  0.4 MG SL tablet Place 1 tablet (0.4 mg total) under the tongue every 5 (five) minutes as needed for chest pain. 25 tablet 3  . pantoprazole (PROTONIX) 40 MG tablet Take 1 tablet (40 mg total) by mouth 2 (two) times daily before a meal. 180 tablet 1  . potassium chloride SA (KLOR-CON) 20 MEQ tablet Take 40 mEq by mouth every other day.     . rivaroxaban (XARELTO) 20 MG TABS tablet TAKE 1 TABLET EVERY DAY WITH SUPPER (Patient taking differently: Take 20 mg by mouth daily with supper. TAKE 1 TABLET EVERY DAY WITH SUPPER) 90 tablet 1  . rosuvastatin (CRESTOR) 10 MG tablet Take 1 tablet (10 mg total) by mouth daily. 90 tablet 3  . sotalol (BETAPACE) 160 MG tablet TAKE 1 TABLET BY MOUTH TWICE DAILY (Patient taking differently: Take 160 mg by mouth 2 (two) times daily. ) 60 tablet 6  . spironolactone (ALDACTONE) 25 MG tablet Take 0.5 tablets (12.5 mg total) by mouth daily. 15 tablet 3  . SYNTHROID 112 MCG tablet Take 112 mcg by mouth every morning.   5  . traZODone (DESYREL) 50 MG tablet Take 0.5 tablets (25 mg total) by mouth at bedtime as needed for sleep. 30 tablet 0  . triamcinolone (NASACORT ALLERGY 24HR) 55 MCG/ACT AERO nasal inhaler Place 2 sprays into the  nose daily.      Current Facility-Administered Medications on File Prior to Encounter  Medication Dose Route Frequency Provider Last Rate Last Admin  . bivalirudin (ANGIOMAX) 250 mg in sodium chloride 0.9 % 50 mL (5 mg/mL) infusion    Continuous PRN Kathleene Hazel, MD   Stopped at 09/20/15 [phone]  . bivalirudin (ANGIOMAX) BOLUS via infusion    PRN Kathleene Hazel, MD   54.45 mg at 09/20/15 3403  . fentaNYL (SUBLIMAZE) injection    PRN Kathleene Hazel, MD   25 mcg at 09/20/15 0651  . heparin 1,500 mL    PRN Kathleene Hazel, MD   Given at 09/20/15 [phone]  . heparin infusion 2 units/mL in 0.9 % sodium chloride    Continuous PRN Kathleene Hazel, MD   1,500 mL at 09/20/15 0733  . iopamidol (ISOVUE-370) 76 % injection    PRN Kathleene Hazel, MD   165 mL at 09/20/15 0733  . lidocaine (PF) (XYLOCAINE) 1 % injection    PRN Kathleene Hazel, MD   2 mL at 09/20/15 4383  . midazolam (VERSED) injection    PRN Kathleene Hazel, MD   1 mg at 09/20/15 0651  . nitroGLYCERIN 1 mg/10 ml (100 mcg/ml) - IR/CATH LAB    PRN Kathleene Hazel, MD   200 mcg at 09/20/15 8184  . Radial Cocktail/Verapamil only    PRN Kathleene Hazel, MD   10 mL at 09/20/15 0629  . ticagrelor (BRILINTA) tablet    PRN Kathleene Hazel, MD   180 mg at 09/20/15 [phone]  . tirofiban (AGGRASTAT) bolus via infusion    PRN Kathleene Hazel, MD   1,815 mcg at 09/20/15 [phone]  . tirofiban (AGGRASTAT) infusion 50 mcg/mL 100 mL    Continuous PRN Kathleene Hazel, MD 13.1 mL/hr at 09/20/15 0651 0.15 mcg/kg/min at 09/20/15 0651   Vital Signs  BP 104/64   Pulse 61   Wt 68.2 kg (150 lb 6.4 oz)   SpO2 100%   BMI 22.21 kg/m  PHYSICAL EXAM: General:  Well appearing. No respiratory difficulty HEENT: normal Neck: supple. no  JVD. Carotids 2+ bilat; no bruits. No lymphadenopathy or thyromegaly appreciated. Cor: PMI nondisplaced. Regular rate & rhythm. No rubs,  gallops or murmurs. Lungs: clear Abdomen: soft, nontender, nondistended. No hepatosplenomegaly. No bruits or masses. Good bowel sounds. Extremities: no cyanosis, clubbing, rash, edema Neuro: alert & oriented x 3, cranial nerves grossly intact. moves all 4 extremities w/o difficulty. Affect pleasant.  Assessment/Plan: 1. CAD: S/p anterior MI in 7/17 with DES to RCA.  Cath in 10/19 with no obstructive CAD.   - denies CP  - She is off Plavix now and taking only Xarelto 20 mg daily.  - She is tolerating Crestor without myalgias.  Good lipids in 9/20.  Not fasting today. Will check FLP at next visit  2. Chronic systolic CHF: Ischemic cardiomyopathy.  EF 25% on echo in 10/17, 30-35% on echo in 11/17, 30% on TEE in 8/18. She has extensive LAD-territory scar. 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  Echo in 12/20 showed that EF remains 30% with LAD-territory scar.  She is not volume overloaded on exam nor by CorVue. NYHA class II symptoms. - Off Coreg with low BP and addition of sotalol.   - Continue losartan to 12.5 mg qhs.   - She did not tolerate dapagliflozin due to yeast infections.   - Continue spironolactone to 12.5 mg daily.  - Continue Lasix 20 mg qod - no room for med titration given soft BP and prior issues w/ dizziness  - QRS does not appear to be wide enough that she would have benefit from CRT.  - Check BMET today.  3. Mitral regurgitation: Moderate to severe on 10/17 echo, severe on 11/17 echo.  I did a TEE in 8/18.  This showed moderate MR that appeared to be functional.  No indication for surgery or Mitraclip. Echo 12/20 showed moderate MR, functional.  4. Atrial fibrillation: Unable to tolerate amiodarone due to GI side effects.  She is in NSR today on Xarelto 20 mg daily and sotalol. QTc interval is acceptable on sotalol on today's ECG.   - She is on Xarelto but undergoing GI w/u. Has EGD scheduled next week.  She can hold Xarelto if needed for GI bleeding, may do better eventually on apixaban given  GI bleeding.   5. PVCs: Very frequent on 12/18 holter (41% beats), she has had a PVC ablation in 2/19. Due to recurrence of PVCs post-ablation, sotalol was increased to 160 mg bid. She does not seem to have many PVCs.  - Continue sotalol, QTc ok on ECG today.  6. Hyperlipidemia: Tolerating Crestor, good lipids in 9/20. Not fasting today. Will check FLP at next visit.   7. Fe deficiency anemia: Associated with dark stool/melena.  Last Hgb up from 8.0>> 9.3. She has been getting IV Fe. Scheduled for EGD next week.  She can hold Xarelto if needed. As above, with GI bleeding, transition to Eliquis eventually may be beneficial. Will wait to see what EGD reveals.  - Check CBC today.   Followup in 4-6 weeks   Robbie Lis, PA-C  06/09/2019

## 2019-06-13 ENCOUNTER — Other Ambulatory Visit: Payer: Self-pay

## 2019-06-13 ENCOUNTER — Encounter (HOSPITAL_COMMUNITY)
Admission: RE | Admit: 2019-06-13 | Discharge: 2019-06-13 | Disposition: A | Discharge status: Home / Self Care | Payer: BC Managed Care – PPO | Source: Ambulatory Visit | Attending: Internal Medicine | Admitting: Internal Medicine

## 2019-06-13 ENCOUNTER — Other Ambulatory Visit (HOSPITAL_COMMUNITY)
Admission: RE | Admit: 2019-06-13 | Discharge: 2019-06-13 | Disposition: A | Discharge status: Home / Self Care | Payer: BC Managed Care – PPO | Source: Ambulatory Visit | Attending: Internal Medicine | Admitting: Internal Medicine

## 2019-06-13 DIAGNOSIS — Z01812 Encounter for preprocedural laboratory examination: Secondary | ICD-10-CM | POA: Insufficient documentation

## 2019-06-13 DIAGNOSIS — Z20822 Contact with and (suspected) exposure to covid-19: Secondary | ICD-10-CM | POA: Insufficient documentation

## 2019-06-14 LAB — SARS CORONAVIRUS 2 (TAT 6-24 HRS): SARS Coronavirus 2: NEGATIVE

## 2019-06-16 ENCOUNTER — Encounter (HOSPITAL_COMMUNITY): Payer: Self-pay | Admitting: Internal Medicine

## 2019-06-16 ENCOUNTER — Encounter (HOSPITAL_COMMUNITY)
Admission: RE | Disposition: A | Discharge status: Home / Self Care | Payer: Self-pay | Source: Home / Self Care | Attending: Internal Medicine

## 2019-06-16 ENCOUNTER — Ambulatory Visit (HOSPITAL_COMMUNITY)
Admission: RE | Admit: 2019-06-16 | Discharge: 2019-06-16 | Disposition: A | Discharge status: Home / Self Care | Payer: BC Managed Care – PPO | Attending: Internal Medicine | Admitting: Internal Medicine

## 2019-06-16 ENCOUNTER — Other Ambulatory Visit: Payer: Self-pay

## 2019-06-16 ENCOUNTER — Ambulatory Visit (HOSPITAL_COMMUNITY): Payer: BC Managed Care – PPO | Admitting: Anesthesiology

## 2019-06-16 DIAGNOSIS — N183 Chronic kidney disease, stage 3 unspecified: Secondary | ICD-10-CM | POA: Insufficient documentation

## 2019-06-16 DIAGNOSIS — I5022 Chronic systolic (congestive) heart failure: Secondary | ICD-10-CM | POA: Diagnosis not present

## 2019-06-16 DIAGNOSIS — Z95810 Presence of automatic (implantable) cardiac defibrillator: Secondary | ICD-10-CM | POA: Insufficient documentation

## 2019-06-16 DIAGNOSIS — R7303 Prediabetes: Secondary | ICD-10-CM | POA: Insufficient documentation

## 2019-06-16 DIAGNOSIS — I252 Old myocardial infarction: Secondary | ICD-10-CM | POA: Insufficient documentation

## 2019-06-16 DIAGNOSIS — I2510 Atherosclerotic heart disease of native coronary artery without angina pectoris: Secondary | ICD-10-CM | POA: Insufficient documentation

## 2019-06-16 DIAGNOSIS — K219 Gastro-esophageal reflux disease without esophagitis: Secondary | ICD-10-CM | POA: Diagnosis present

## 2019-06-16 DIAGNOSIS — I493 Ventricular premature depolarization: Secondary | ICD-10-CM | POA: Diagnosis not present

## 2019-06-16 DIAGNOSIS — K449 Diaphragmatic hernia without obstruction or gangrene: Secondary | ICD-10-CM | POA: Insufficient documentation

## 2019-06-16 DIAGNOSIS — D509 Iron deficiency anemia, unspecified: Secondary | ICD-10-CM | POA: Insufficient documentation

## 2019-06-16 DIAGNOSIS — Z7901 Long term (current) use of anticoagulants: Secondary | ICD-10-CM | POA: Diagnosis not present

## 2019-06-16 DIAGNOSIS — Z79890 Hormone replacement therapy (postmenopausal): Secondary | ICD-10-CM | POA: Diagnosis not present

## 2019-06-16 DIAGNOSIS — I480 Paroxysmal atrial fibrillation: Secondary | ICD-10-CM | POA: Diagnosis not present

## 2019-06-16 DIAGNOSIS — Z79899 Other long term (current) drug therapy: Secondary | ICD-10-CM | POA: Insufficient documentation

## 2019-06-16 DIAGNOSIS — E039 Hypothyroidism, unspecified: Secondary | ICD-10-CM | POA: Diagnosis not present

## 2019-06-16 DIAGNOSIS — I255 Ischemic cardiomyopathy: Secondary | ICD-10-CM | POA: Diagnosis not present

## 2019-06-16 HISTORY — PX: ESOPHAGOGASTRODUODENOSCOPY (EGD) WITH PROPOFOL: SHX5813

## 2019-06-16 HISTORY — PX: BIOPSY: SHX5522

## 2019-06-16 SURGERY — ESOPHAGOGASTRODUODENOSCOPY (EGD) WITH PROPOFOL
Anesthesia: General

## 2019-06-16 MED ORDER — CHLORHEXIDINE GLUCONATE CLOTH 2 % EX PADS: 6.0000 | MEDICATED_PAD | Freq: Once | CUTANEOUS | Status: DC

## 2019-06-16 MED ORDER — LIDOCAINE HCL 1 % IJ SOLN
INTRAMUSCULAR | Status: DC | PRN
  Administered 2019-06-16: 40 mg via INTRADERMAL

## 2019-06-16 MED ORDER — STERILE WATER FOR IRRIGATION IR SOLN
Status: DC | PRN
  Administered 2019-06-16: 100 mL

## 2019-06-16 MED ORDER — PROPOFOL 10 MG/ML IV BOLUS
INTRAVENOUS | Status: AC
  Filled 2019-06-16: qty 40

## 2019-06-16 MED ORDER — LACTATED RINGERS IV SOLN: INTRAVENOUS | Status: DC

## 2019-06-16 MED ORDER — PROPOFOL 500 MG/50ML IV EMUL
INTRAVENOUS | Status: DC | PRN
  Administered 2019-06-16: 150 ug/kg/min via INTRAVENOUS

## 2019-06-16 MED ORDER — PROPOFOL 10 MG/ML IV BOLUS
INTRAVENOUS | Status: DC | PRN
  Administered 2019-06-16: 50 mg via INTRAVENOUS

## 2019-06-16 NOTE — Transfer of Care (Signed)
Immediate Anesthesia Transfer of Care Note  Patient: Catherine Mays  Procedure(s) Performed: ESOPHAGOGASTRODUODENOSCOPY (EGD) WITH PROPOFOL (N/A ) BIOPSY  Patient Location: PACU  Anesthesia Type:General  Level of Consciousness: awake  Airway & Oxygen Therapy: Patient Spontanous Breathing  Post-op Assessment: Report given to RN  Post vital signs: Reviewed and stable  Last Vitals:  Vitals Value Taken Time  BP 102/68 06/16/19 0837  Temp    Pulse 55 06/16/19 0839  Resp 22 06/16/19 0839  SpO2 99 % 06/16/19 0839  Vitals shown include unvalidated device data.  Last Pain:  Vitals:   06/16/19 0831  TempSrc:   PainSc: 0-No pain         Complications: No apparent anesthesia complications

## 2019-06-16 NOTE — Interval H&P Note (Signed)
History and Physical Interval Note:  06/16/2019 8:07 AM  Catherine Mays  has presented today for surgery, with the diagnosis of iron deficiency anemia, nausea, GERD.  The various methods of treatment have been discussed with the patient and family. After consideration of risks, benefits and other options for treatment, the patient has consented to  Procedure(s) with comments: ESOPHAGOGASTRODUODENOSCOPY (EGD) WITH PROPOFOL (N/A) - 8:00am as a surgical intervention.  The patient's history has been reviewed, patient examined, no change in status, stable for surgery.  I have reviewed the patient's chart and labs.  Questions were answered to the patient's satisfaction.     Robert Rourk   No change.  No dysphagia.  Patient does endorse some improvement with twice daily Protonix.  EGD today per plan.The risks, benefits, limitations, alternatives and imponderables have been reviewed with the patient. Potential for esophageal dilation, biopsy, etc. have also been reviewed.  Questions have been answered. All parties agreeable.

## 2019-06-16 NOTE — Discharge Instructions (Signed)
EGD Discharge instructions Please read the instructions outlined below and refer to this sheet in the next few weeks. These discharge instructions provide you with general information on caring for yourself after you leave the hospital. Your doctor may also give you specific instructions. While your treatment has been planned according to the most current medical practices available, unavoidable complications occasionally occur. If you have any problems or questions after discharge, please call your doctor. ACTIVITY  You may resume your regular activity but move at a slower pace for the next 24 hours.   Take frequent rest periods for the next 24 hours.   Walking will help expel (get rid of) the air and reduce the bloated feeling in your abdomen.   No driving for 24 hours (because of the anesthesia (medicine) used during the test).   You may shower.   Do not sign any important legal documents or operate any machinery for 24 hours (because of the anesthesia used during the test).  NUTRITION  Drink plenty of fluids.   You may resume your normal diet.   Begin with a light meal and progress to your normal diet.   Avoid alcoholic beverages for 24 hours or as instructed by your caregiver.  MEDICATIONS  You may resume your normal medications unless your caregiver tells you otherwise.  WHAT YOU CAN EXPECT TODAY  You may experience abdominal discomfort such as a feeling of fullness or "gas" pains.  FOLLOW-UP  Your doctor will discuss the results of your test with you.  SEEK IMMEDIATE MEDICAL ATTENTION IF ANY OF THE FOLLOWING OCCUR:  Excessive nausea (feeling sick to your stomach) and/or vomiting.   Severe abdominal pain and distention (swelling).   Trouble swallowing.   Temperature over 101 F (37.8 C).   Rectal bleeding or vomiting of blood.     Hernia, Adult     A hernia happens when tissue inside your body pushes out through a weak spot in your belly muscles (abdominal  wall). This makes a round lump (bulge). The lump may be:  In a scar from surgery that was done in your belly (incisional hernia).  Near your belly button (umbilical hernia).  In your groin (inguinal hernia). Your groin is the area where your leg meets your lower belly (abdomen). This kind of hernia could also be: ? In your scrotum, if you are female. ? In folds of skin around your vagina, if you are female.  In your upper thigh (femoral hernia).  Inside your belly (hiatal hernia). This happens when your stomach slides above the muscle between your belly and your chest (diaphragm). If your hernia is small and it does not cause pain, you may not need treatment. If your hernia is large or it causes pain, you may need surgery. Follow these instructions at home: Activity  Avoid stretching or overusing (straining) the muscles near your hernia. Straining can happen when you: ? Lift something heavy. ? Poop (have a bowel movement).  Do not lift anything that is heavier than 10 lb (4.5 kg), or the limit that you are told, until your doctor says that it is safe.  Use the strength of your legs when you lift something heavy. Do not use only your back muscles to lift. General instructions  Do these things if told by your doctor so you do not have trouble pooping (constipation): ? Drink enough fluid to keep your pee (urine) pale yellow. ? Eat foods that are high in fiber. These include fresh fruits and  vegetables, whole grains, and beans. ? Limit foods that are high in fat and processed sugars. These include foods that are fried or sweet. ? Take medicine for trouble pooping.  When you cough, try to cough gently.  You may try to push your hernia in by very gently pressing on it when you are lying down. Do not try to force the bulge back in if it will not push in easily.  If you are overweight, work with your doctor to lose weight safely.  Do not use any products that have nicotine or tobacco in  them. These include cigarettes and e-cigarettes. If you need help quitting, ask your doctor.  If you will be having surgery (hernia repair), watch your hernia for changes in shape, size, or color. Tell your doctor if you see any changes.  Take over-the-counter and prescription medicines only as told by your doctor.  Keep all follow-up visits as told by your doctor. Contact a doctor if:  You get new pain, swelling, or redness near your hernia.  You poop fewer times in a week than normal.  You have trouble pooping.  You have poop (stool) that is more dry than normal.  You have poop that is harder or larger than normal. Get help right away if:  You have a fever.  You have belly pain that gets worse.  You feel sick to your stomach (nauseous).  You throw up (vomit).  Your hernia cannot be pushed in by very gently pressing on it when you are lying down. Do not try to force the bulge back in if it will not push in easily.  Your hernia: ? Changes in shape or size. ? Changes color. ? Feels hard or it hurts when you touch it. These symptoms may represent a serious problem that is an emergency. Do not wait to see if the symptoms will go away. Get medical help right away. Call your local emergency services (911 in the [place] Summary  A hernia happens when tissue inside your body pushes out through a weak spot in the belly muscles. This creates a bulge.  If your hernia is small and it does not hurt, you may not need treatment. If your hernia is large or it hurts, you may need surgery.  If you will be having surgery, watch your hernia for changes in shape, size, or color. Tell your doctor about any changes. This information is not intended to replace advice given to you by your health care provider. Make sure you discuss any questions you have with your health care provider. Document Revised: 06/03/2018 Document Reviewed: 11/12/2016 Elsevier Patient Education  2020 [employer].   Information on hiatal hernia provided  Biopsies of your stomach and duodenum were taken today  Resume Xarelto today   Continue Protonix 40 mg twice daily  Office visit with [place] in 1 month  Further recommendations to follow pending review of pathology report  At patient request I called Levada Dy at [phone] left a message with results

## 2019-06-16 NOTE — Op Note (Signed)
[facility] Patient Name: Catherine Mays Procedure Date: 06/16/2019 7:59 AM MRN: [id number] Date of Birth: [birth year] Attending MD: Gennette Pac , MD CSN: [id number] Age: 54 Admit Type: Outpatient Procedure:                Upper GI endoscopy Indications:              Esophageal reflux symptoms that persist despite                            appropriate therapy; iron deficiency anemia Providers:                Gennette Pac, MD, Jannett Celestine, RN, Dyann Ruddle Referring MD:              Medicines:                Propofol per Anesthesia Complications:            No immediate complications. Estimated Blood Loss:     Estimated blood loss was minimal. Procedure:                Pre-Anesthesia Assessment:                           - Prior to the procedure, a History and Physical                            was performed, and patient medications and                            allergies were reviewed. The patient's tolerance of                            previous anesthesia was also reviewed. The risks                            and benefits of the procedure and the sedation                            options and risks were discussed with the patient.                            All questions were answered, and informed consent                            was obtained. Prior Anticoagulants: The patient                            last took Xarelto (rivaroxaban) 3 days prior to the                            procedure. ASA Grade Assessment: III - A patient  with severe systemic disease. After reviewing the                            risks and benefits, the patient was deemed in                            satisfactory condition to undergo the procedure.                           After obtaining informed consent, the endoscope was                            passed under direct vision. Throughout the   procedure, the patient's blood pressure, pulse, and                            oxygen saturations were monitored continuously. The                            GIF-H190 ([id number]) scope was introduced through the                            mouth, and advanced to the third part of duodenum.                            The upper GI endoscopy was accomplished without                            difficulty. The patient tolerated the procedure                            well. Scope In: 8:22:35 AM Scope Out: 8:29:02 AM Total Procedure Duration: 0 hours 6 minutes 27 seconds  Findings:      The examined esophagus was normal. Did not see a Zenker's diverticulum.      A small hiatal hernia was present.      The stomach was otherwise without abnormality.      The second portion of the duodenum and third portion of the duodenum       were normal.      Biopsies of the stomach and duodenum were taken for histologic study Impression:               - Normal esophagus.                           - Small hiatal hernia.                           - The examination was otherwise normal. Status post                            gastric and small bowel biopsy Moderate Sedation:      Moderate (conscious) sedation was personally administered by an       anesthesia professional. The following parameters were monitored: oxygen       saturation, heart rate, blood pressure,  respiratory rate, EKG, adequacy       of pulmonary ventilation, and response to care. Recommendation:           - Patient has a contact number available for                            emergencies. The signs and symptoms of potential                            delayed complications were discussed with the                            patient. Return to normal activities tomorrow.                            Written discharge instructions were provided to the                            patient.                           - Advance diet as tolerated.  Continue pantoprazole                            40 mg twice daily as this apparently has been                            helping her symptoms. Resume Xarelto today.                            Follow-up on pathology. Office visit with [place] in 1                            month. Procedure Code(s):        --- Professional ---                           [id number], Esophagogastroduodenoscopy, flexible,                            transoral; diagnostic, including collection of                            specimen(s) by brushing or washing, when performed                            (separate procedure) Diagnosis Code(s):        --- Professional ---                           K44.9, Diaphragmatic hernia without obstruction or                            gangrene                           K21.9,  Gastro-esophageal reflux disease without                            esophagitis CPT copyright 2019 American Medical [employer]. All rights reserved. The codes documented in this report are preliminary and upon coder review may  be revised to meet current compliance requirements. Cristopher Estimable. Kalei Meda, MD Norvel Richards, MD 06/16/2019 8:41:35 AM This report has been signed electronically. Number of Addenda: 0

## 2019-06-16 NOTE — Anesthesia Preprocedure Evaluation (Signed)
Anesthesia Evaluation  Patient identified by MRN, date of birth, ID band Patient awake    Reviewed: Allergy & Precautions, H&P , NPO status , Patient's Chart, lab work & pertinent test results, reviewed documented beta blocker date and time   Airway Mallampati: II  TM Distance: >3 FB Neck ROM: full    Dental no notable dental hx.    Pulmonary neg pulmonary ROS,    Pulmonary exam normal breath sounds clear to auscultation       Cardiovascular Exercise Tolerance: Good + CAD, + Past MI and +CHF  + pacemaker + Cardiac Defibrillator  Rhythm:regular Rate:Normal     Neuro/Psych negative neurological ROS  negative psych ROS   GI/Hepatic Neg liver ROS, GERD  Medicated,  Endo/Other  Hypothyroidism   Renal/GU CRFRenal disease  negative genitourinary   Musculoskeletal   Abdominal   Peds  Hematology  (+) Blood dyscrasia, anemia ,   Anesthesia Other Findings   Reproductive/Obstetrics negative OB ROS                             Anesthesia Physical Anesthesia Plan  ASA: III  Anesthesia Plan: General   Post-op Pain Management:    Induction:   PONV Risk Score and Plan: 2 and Propofol infusion  Airway Management Planned:   Additional Equipment:   Intra-op Plan:   Post-operative Plan:   Informed Consent: I have reviewed the patients History and Physical, chart, labs and discussed the procedure including the risks, benefits and alternatives for the proposed anesthesia with the patient or authorized representative who has indicated his/her understanding and acceptance.     Dental Advisory Given  Plan Discussed with: CRNA  Anesthesia Plan Comments:         Anesthesia Quick Evaluation

## 2019-06-16 NOTE — Anesthesia Postprocedure Evaluation (Signed)
Anesthesia Post Note  Patient: Tabbetha Kutscher  Procedure(s) Performed: ESOPHAGOGASTRODUODENOSCOPY (EGD) WITH PROPOFOL (N/A ) BIOPSY  Patient location during evaluation: PACU Anesthesia Type: General Level of consciousness: awake and alert and oriented Pain management: pain level controlled Vital Signs Assessment: post-procedure vital signs reviewed and stable Respiratory status: spontaneous breathing Cardiovascular status: blood pressure returned to baseline and stable Postop Assessment: no apparent nausea or vomiting Anesthetic complications: no     Last Vitals:  Vitals:   06/16/19 0723 06/16/19 0840  BP: 104/67 102/68  Pulse: (!) 51 (!) 55  Resp: 18 (!) 22  Temp: 36.9 C (P) 36.5 C  SpO2: 100% 99%    Last Pain:  Vitals:   06/16/19 0831  TempSrc:   PainSc: 0-No pain                 Salle Brandle

## 2019-06-16 NOTE — Interval H&P Note (Signed)
History and Physical Interval Note:  06/16/2019 8:12 AM  Catherine Mays  has presented today for surgery, with the diagnosis of iron deficiency anemia, nausea, GERD.  The various methods of treatment have been discussed with the patient and family. After consideration of risks, benefits and other options for treatment, the patient has consented to  Procedure(s) with comments: ESOPHAGOGASTRODUODENOSCOPY (EGD) WITH PROPOFOL (N/A) - 8:00am as a surgical intervention.  The patient's history has been reviewed, patient examined, no change in status, stable for surgery.  I have reviewed the patient's chart and labs.  Questions were answered to the patient's satisfaction.     [name]  No Xarelto in 3 days.

## 2019-06-17 ENCOUNTER — Encounter: Payer: Self-pay | Admitting: Internal Medicine

## 2019-06-17 ENCOUNTER — Other Ambulatory Visit: Payer: Self-pay

## 2019-06-17 LAB — SURGICAL PATHOLOGY

## 2019-06-20 ENCOUNTER — Telehealth: Payer: Self-pay

## 2019-06-20 NOTE — Telephone Encounter (Signed)
Per RMR- Send letter to patient.  Send copy of letter with path to referring provider and PCP.   Patient needs to keep her 5/21 appointment. I would like her to have a I FOBT prior to her next visit. However, do not do it before May 1. If positive, we would likely need to proceed with new evaluation of GI bleeding and IDA

## 2019-06-20 NOTE — Telephone Encounter (Signed)
Lmom, waiting on a return call.  

## 2019-06-20 NOTE — Telephone Encounter (Signed)
Pt returned call and is aware of her procedure results. Pt will receive results in the mail. Letter was mailed this morning. Pt will pickup IFOBT kit on Thursday 06/23/19. IFOBT kit is ready for pickup. Pt is aware she is return.

## 2019-06-21 ENCOUNTER — Ambulatory Visit (INDEPENDENT_AMBULATORY_CARE_PROVIDER_SITE_OTHER): Payer: BC Managed Care – PPO | Admitting: *Deleted

## 2019-06-21 DIAGNOSIS — I509 Heart failure, unspecified: Secondary | ICD-10-CM

## 2019-06-21 LAB — CUP PACEART REMOTE DEVICE CHECK
Battery Remaining Longevity: 74 mo
Battery Remaining Percentage: 71 %
Battery Voltage: 2.98 V
Brady Statistic RV Percent Paced: 1 %
Date Time Interrogation Session: 20210427020015
HighPow Impedance: 69 Ohm
HighPow Impedance: 69 Ohm
Implantable Lead Implant Date: 20171129
Implantable Lead Location: 753860
Implantable Pulse Generator Implant Date: 20171129
Lead Channel Impedance Value: 340 Ohm
Lead Channel Pacing Threshold Amplitude: 1.25 V
Lead Channel Pacing Threshold Pulse Width: 0.5 ms
Lead Channel Sensing Intrinsic Amplitude: 12 mV
Lead Channel Setting Pacing Amplitude: 2.5 V
Lead Channel Setting Pacing Pulse Width: 0.5 ms
Lead Channel Setting Sensing Sensitivity: 0.5 mV
Pulse Gen Serial Number: 7377983

## 2019-06-22 ENCOUNTER — Other Ambulatory Visit: Payer: Self-pay | Admitting: *Deleted

## 2019-06-22 MED ORDER — RIVAROXABAN 20 MG PO TABS: ORAL_TABLET | ORAL | 2 refills | Status: DC

## 2019-06-22 NOTE — Addendum Note (Signed)
Addended by: Pamala Hurry B on: 06/22/2019 01:17 PM   Modules accepted: Orders

## 2019-06-22 NOTE — Telephone Encounter (Signed)
Xarelto 20mg  refill request received. Pt is 54 years old, weight-68 kg, Crea-1.11 on 06/09/2019, last seen by Dr. 06/11/2019 on 05/19/2019, Diagnosis-Afib, CrCl-62.3ml/min; Dose is appropriate based on dosing criteria. Will send in refill to requested pharmacy.

## 2019-06-22 NOTE — Progress Notes (Signed)
ICD Remote  

## 2019-07-04 ENCOUNTER — Other Ambulatory Visit: Payer: Self-pay

## 2019-07-04 ENCOUNTER — Telehealth: Payer: Self-pay | Admitting: *Deleted

## 2019-07-04 ENCOUNTER — Encounter: Payer: Self-pay | Admitting: Gastroenterology

## 2019-07-04 ENCOUNTER — Telehealth: Payer: Self-pay

## 2019-07-04 DIAGNOSIS — D509 Iron deficiency anemia, unspecified: Secondary | ICD-10-CM

## 2019-07-04 LAB — IFOBT (OCCULT BLOOD): IFOBT: POSITIVE

## 2019-07-04 NOTE — Telephone Encounter (Signed)
Pt consented to a virtual visit. 

## 2019-07-04 NOTE — Telephone Encounter (Signed)
IFOBT was submitted today 07/04/19. Results were positive.

## 2019-07-04 NOTE — Telephone Encounter (Signed)
Catherine Mays, you are scheduled for a virtual visit with your provider today.  Just as we do with appointments in the office, we must obtain your consent to participate.  Your consent will be active for this visit and any virtual visit you may have with one of our providers in the next 365 days.  If you have a MyChart account, I can also send a copy of this consent to you electronically.  All virtual visits are billed to your insurance company just like a traditional visit in the office.  As this is a virtual visit, video technology does not allow for your provider to perform a traditional examination.  This may limit your provider's ability to fully assess your condition.  If your provider identifies any concerns that need to be evaluated in person or the need to arrange testing such as labs, EKG, etc, we will make arrangements to do so.  Although advances in technology are sophisticated, we cannot ensure that it will always work on either your end or our end.  If the connection with a video visit is poor, we may have to switch to a telephone visit.  With either a video or telephone visit, we are not always able to ensure that we have a secure connection.   I need to obtain your verbal consent now.   Are you willing to proceed with your visit today?

## 2019-07-05 ENCOUNTER — Other Ambulatory Visit: Payer: Self-pay | Admitting: Cardiology

## 2019-07-05 NOTE — Telephone Encounter (Signed)
Lmom, waiting on a return call.  

## 2019-07-05 NOTE — Telephone Encounter (Addendum)
Keep upcoming ov to address heme + stool/anemia. May require repeat colonoscopy +/- capsule (PATIENT HAS ICD IMPLANTED). Recent EGD completed.

## 2019-07-06 NOTE — Telephone Encounter (Signed)
LSL, see RMR response under results. Would you like pt to still come in for an appointment or schedule the procedure RMR is recommending?

## 2019-07-06 NOTE — Telephone Encounter (Signed)
Ok to schedule ileocolonoscopy as next step for evaluation of IDA/heme + stool. No need for OV unless patient prefers (as pt was last seen in 04/2019).  Schedule TCS with propofol with RMR in next 4-6 weeks. Hold Xarelto for 48 hours before.  She has ICD implanted. Hold iron 7 days.

## 2019-07-06 NOTE — Telephone Encounter (Signed)
Communication noted.  

## 2019-07-06 NOTE — Telephone Encounter (Signed)
Called pt, TCS (ileocolonoscopy) w/Prop w/RMR scheduled for 08/10/19 at 2:15pm (date ok per Melanie at endo). Pt is aware to hold Xarelto for 48 hours and Iron for 7 days prior to procedure. Orders entered.

## 2019-07-06 NOTE — Telephone Encounter (Signed)
Spoke with pt. Pt notified of results and procedure needed. Please schedule ileocolonoscopy with RMR in 4-6 weeks per RMR and LSL.

## 2019-07-07 ENCOUNTER — Other Ambulatory Visit: Payer: Self-pay

## 2019-07-07 NOTE — Telephone Encounter (Signed)
Pre-op/covid test 08/08/19. Letter mailed with procedure instructions.

## 2019-07-07 NOTE — Telephone Encounter (Signed)
PA for TCS submitted via [employer] website. Case in progress. Called [employer], spoke to [place]. TCS approved. Order ID: [id number], valid 07/07/19-09/04/19.

## 2019-07-15 ENCOUNTER — Telehealth: Payer: BC Managed Care – PPO | Admitting: Gastroenterology

## 2019-08-01 ENCOUNTER — Other Ambulatory Visit: Payer: Self-pay

## 2019-08-01 ENCOUNTER — Encounter (HOSPITAL_COMMUNITY): Payer: Self-pay

## 2019-08-01 ENCOUNTER — Ambulatory Visit (HOSPITAL_COMMUNITY)
Admission: RE | Admit: 2019-08-01 | Discharge: 2019-08-01 | Disposition: A | Discharge status: Home / Self Care | Payer: BC Managed Care – PPO | Source: Ambulatory Visit | Attending: Internal Medicine | Admitting: Internal Medicine

## 2019-08-01 VITALS — BP 110/78 | HR 50 | Wt 150.2 lb

## 2019-08-01 DIAGNOSIS — Z7901 Long term (current) use of anticoagulants: Secondary | ICD-10-CM | POA: Diagnosis not present

## 2019-08-01 DIAGNOSIS — I493 Ventricular premature depolarization: Secondary | ICD-10-CM | POA: Insufficient documentation

## 2019-08-01 DIAGNOSIS — Z5181 Encounter for therapeutic drug level monitoring: Secondary | ICD-10-CM

## 2019-08-01 DIAGNOSIS — D509 Iron deficiency anemia, unspecified: Secondary | ICD-10-CM | POA: Insufficient documentation

## 2019-08-01 DIAGNOSIS — Z79899 Other long term (current) drug therapy: Secondary | ICD-10-CM | POA: Insufficient documentation

## 2019-08-01 DIAGNOSIS — I480 Paroxysmal atrial fibrillation: Secondary | ICD-10-CM | POA: Insufficient documentation

## 2019-08-01 DIAGNOSIS — I340 Nonrheumatic mitral (valve) insufficiency: Secondary | ICD-10-CM | POA: Insufficient documentation

## 2019-08-01 DIAGNOSIS — Z95810 Presence of automatic (implantable) cardiac defibrillator: Secondary | ICD-10-CM | POA: Diagnosis not present

## 2019-08-01 DIAGNOSIS — I255 Ischemic cardiomyopathy: Secondary | ICD-10-CM | POA: Diagnosis not present

## 2019-08-01 DIAGNOSIS — Z955 Presence of coronary angioplasty implant and graft: Secondary | ICD-10-CM | POA: Diagnosis not present

## 2019-08-01 DIAGNOSIS — Z79890 Hormone replacement therapy (postmenopausal): Secondary | ICD-10-CM | POA: Insufficient documentation

## 2019-08-01 DIAGNOSIS — Z8249 Family history of ischemic heart disease and other diseases of the circulatory system: Secondary | ICD-10-CM | POA: Insufficient documentation

## 2019-08-01 DIAGNOSIS — E785 Hyperlipidemia, unspecified: Secondary | ICD-10-CM | POA: Diagnosis not present

## 2019-08-01 DIAGNOSIS — I252 Old myocardial infarction: Secondary | ICD-10-CM | POA: Diagnosis not present

## 2019-08-01 DIAGNOSIS — I2510 Atherosclerotic heart disease of native coronary artery without angina pectoris: Secondary | ICD-10-CM | POA: Insufficient documentation

## 2019-08-01 DIAGNOSIS — E611 Iron deficiency: Secondary | ICD-10-CM | POA: Diagnosis not present

## 2019-08-01 DIAGNOSIS — I5022 Chronic systolic (congestive) heart failure: Secondary | ICD-10-CM | POA: Diagnosis not present

## 2019-08-01 DIAGNOSIS — Z801 Family history of malignant neoplasm of trachea, bronchus and lung: Secondary | ICD-10-CM | POA: Diagnosis not present

## 2019-08-01 LAB — HEPATIC FUNCTION PANEL
ALT: 14 U/L (ref 0–44)
AST: 17 U/L (ref 15–41)
Albumin: 4.1 g/dL (ref 3.5–5.0)
Alkaline Phosphatase: 48 U/L (ref 38–126)
Bilirubin, Direct: <0.1 mg/dL (ref 0.0–0.2)
Total Bilirubin: 0.8 mg/dL (ref 0.3–1.2)
Total Protein: 6.6 g/dL (ref 6.5–8.1)

## 2019-08-01 LAB — LIPID PANEL
Cholesterol: 148 mg/dL (ref 0–200)
HDL: 62 mg/dL (ref >40)
LDL Cholesterol: 73 mg/dL (ref 0–99)
Total CHOL/HDL Ratio: 2.4 RATIO
Triglycerides: 66 mg/dL (ref <150)
VLDL: 13 mg/dL (ref 0–40)

## 2019-08-01 LAB — CBC
HCT: 38.3 % (ref 36.0–46.0)
Hemoglobin: 11.9 g/dL — ABNORMAL LOW (ref 12.0–15.0)
MCH: 28.9 pg (ref 26.0–34.0)
MCHC: 31.1 g/dL (ref 30.0–36.0)
MCV: 93 fL (ref 80.0–100.0)
Platelets: 146 10*3/uL — ABNORMAL LOW (ref 150–400)
RBC: 4.12 MIL/uL (ref 3.87–5.11)
RDW: 19.8 % — ABNORMAL HIGH (ref 11.5–15.5)
WBC: 4.9 10*3/uL (ref 4.0–10.5)
nRBC: 0 % (ref 0.0–0.2)

## 2019-08-01 LAB — BASIC METABOLIC PANEL
Anion gap: 8 (ref 5–15)
BUN: 21 mg/dL — ABNORMAL HIGH (ref 6–20)
CO2: 28 mmol/L (ref 22–32)
Calcium: 8.8 mg/dL — ABNORMAL LOW (ref 8.9–10.3)
Chloride: 100 mmol/L (ref 98–111)
Creatinine, Ser: 1.14 mg/dL — ABNORMAL HIGH (ref 0.44–1.00)
GFR calc Af Amer: >60 mL/min (ref >60)
GFR calc non Af Amer: 55 mL/min — ABNORMAL LOW (ref >60)
Glucose, Bld: 88 mg/dL (ref 70–99)
Potassium: 4.2 mmol/L (ref 3.5–5.1)
Sodium: 136 mmol/L (ref 135–145)

## 2019-08-01 MED ORDER — SOTALOL HCL 160 MG PO TABS: ORAL_TABLET | ORAL | 6 refills | Status: DC

## 2019-08-01 NOTE — Patient Instructions (Addendum)
CHANGE Lasix to 20 mg daily for 3 days, then resume normal dose of every other day as needed DECREASE Sotalolto 160mg , one tab in the AM and and 80 mg one half tab in the PM   Labs today We will only contact you if something comes back abnormal or we need to make some changes. Otherwise no news is good news!  Your physician recommends that you schedule a follow-up appointment in: 1 week for a EKG/nurse visit   Your physician recommends that you schedule a follow-up appointment in: 3-4 weeks  Do the following things EVERYDAY: 1) Weigh yourself in the morning before breakfast. Write it down and keep it in a log. 2) Take your medicines as prescribed 3) Eat low salt foods--Limit salt (sodium) to 2000 mg per day.  4) Stay as active as you can everyday 5) Limit all fluids for the day to less than 2 liters  At the [facility], you and your health needs are our priority. As part of our continuing mission to provide you with exceptional heart care, we have created designated Provider Care Teams. These Care Teams include your primary Cardiologist (physician) and Advanced Practice Providers (APPs- Physician Assistants and Nurse Practitioners) who all work together to provide you with the care you need, when you need it.   You may see any of the following providers on your designated Care Team at your next follow up: Dr Marland Kitchen . Dr Arvilla Meres . Marca Ancona, NP . Tonye Becket, PA . Robbie Lis, PharmD   Please be sure to bring in all your medications bottles to every appointment.

## 2019-08-01 NOTE — Progress Notes (Signed)
PCP: Dr. Margo Aye HF Cardiology: Dr. Shirlee Latch  Reason for Visit: F/u for chronic systolic heart failure   54 y.o. with history of CAD s/p anterior STEMI with DES to LAD, ischemic cardiomyopathy, mitral regurgitation, and paroxysmal atrial fibrillation presents for followup of CHF and CAD.  She was admitted in 7/17 with anterior STEMI.  She had a late presentation to the cath lab.  Echo in 10/17 showed EF about 25% with LAD-territory wall motion abnormalities.  She had peri-MI atrial fibrillation and was started on Xarelto.  She was put on amiodarone.   After discharge, she continued to feel poorly.  She developed nausea, anorexia, and significant fatigue.  She was admitted for evaluation in 10/17 given concern for low output heart failure.  However, stopping amiodarone essentially resolved her symptoms.  She had RHC showing preserved cardiac output but the PCWP was high due to prominent v-waves, presumably from significant mitral regurgitation.  Of note, she was in atrial fibrillation for a time during this admission.   She had St Jude ICD placed.  She is back at work for the [employer].   TEE in 8/18 to evaluate the mitral valve showed EF 30%, moderate MR (probably functional).    Patient was seen by Dr. Elberta Fortis and noted to have very frequent PVCs on device interrogation.  Holter in 12/18 showed 41% PVCs.  She was started on sotalol 80 mg bid and titrated up to 120 mg bid.  Coreg was stopped with the addition of sotalol and losartan was decreased to 12.5 mg daily due to low BP.  She had PVC ablation in 2/19 but PVCs recurred. Sotalol was increased to 160 mg bid.   Echo 8/19 showed EF 30% with regional wall motion abnormalities, normal RV size and systolic function, moderate MR.   She was admitted in 10/19 with chest pain.  Cath showed patent LAD stent and no obstructive CAD.   Echo 12/20, EF 30% with LAD wall motion abnormalities, normal RV, moderate MR.   She was started on  Farxiga but discontinued it after developing a chronic yeast infection.   She had recent f/u w/ Dr. Shirlee Latch on 3/25. Was feeling poorly and had been recently found to be anemic with Fe deficiency (hgb down to 8).  She had been started on IV Fe and scheduled to see GI. She had endorsed dizziness and low BP but no syncope/ near syncope. Given soft BP and dizziness, Losartan was reduced to 12.5 mg qhs and spironolactone reduced to 12.5 mg daily. She was also advised to take lasix only PRN.   She had return f/u last month and was doing better from cardiac standpoint. Was euvoelmic w/ stable BP. Dizziness had improved.   She presents back again for f/u. Since her last visit, she had an EGD which was unremarkable. She continues to have dark stools and had repeat FOBT that was +. She is scheduled for colonoscopy on 6/16. She continues on Xarelto. Also takes daily Fe. Her BP is stable, but she does note occasional dizziness. No syncope/ near syncope. She denies dyspnea but feels "bloated". She thinks that her wt is up ~5 lb. Thoracic  impedence has drifted below reference curve but is drifting back up.   ECG (personally reviewed): sinus brady, 45 bpm, QT/QTc 466/420ms  St Jude ICD interrogation: Thoracic impedence below reference curve but trending back up   Labs (10/17): K 3.9, creatinine 1.24, hgb 10.3 Labs (11/17): K 4, creatinine 1.27 Labs (12/17): K  4.2, creatinine 1.12, BNP 439 Labs (2/18): K 4.7, creatinine 1.37 Labs (7/18): K 3.9, creatinine 1.14, hgb 11.7 Labs (12/18): K 4.5, creatinine 1.23 Labs (1/19): K 4.4, creatinine 1.14 Labs (2/19): LDL 97 (off atorvastatin), HDL 56 Labs (5/19): K 4.3, creatinine 1.1 Labs (10/19): K 4.2, creatinine 1.2, LDL 70, hgb 11.8 Labs (11/19): K 4.9, creatinine 1.17, hgb 11.8, LDL 68, low TSH  Labs (9/20): K 4.4, creatinine 1.34, LDL 52 Labs (12/20): K 4.6, creatinine 1.19 Labs (3/21): K 4.1, creatinine 1.22, hgb 9.3  Labs (4/21): K 4.2, creatinine 1.11, hgb  10.3  PMH:  1. CAD: Anterior STEMI in 7/17 with late presentation to the cath lab. She had DES to proximal LAD.  No significant disease in other vessels.  - LHC (10/19): Patent LAD stent, no obstructive disease.  2. Chronic systolic CHF: Ischemic cardiomyopathy.  - Echo 10/17 with EF 25%, akinesis of the mid-apical anteroseptal and anterior walls, apical dyskinesis, moderate to severe MR with incomplete coaptation.  - RHC (10/17): mean RA 4, PA 59/17 mean 38, mean PCWP 27 with prominent V waves suggesting significant MR, CI 3.06, PVR 2 WU.  - ACEI cough.  - Echo (11/17): EF 30-35%, normal RV size and systolic function, severe MR with restriction of posterior leaflet.   - Echo (8/19): EF 30% with regional wall motion abnormalities, normal RV size and systolic function, moderate MR.  - Echo (12/20): EF 30%, LAD wall motion abnormalities, normal RV, PASP 35, moderate functional MR.  3. Atrial fibrillation: Paroxysmal.  Noted 7/17 admission peri-MI, also noted again 10/17 admission. She did not tolerate amiodarone due to nausea/anorexia.  4. Mitral regurgitation: Moderate to severe on 10/17 echo, incomplete coaptation.  ?Etiology, her MI (anterior) does not typically lead to ischemic MR. - Echo in 11/17 with severe MR, posterior leaflet restricted.  - TEE (8/18): EF 30%, moderately dilated LV with akinetic septal and anterior walls, moderate functional MR, normal RV size and systolic function.  - Echo (8/19): Moderate MR 5. Hyperlipidemia: Myalgias with atorvastatin.  6. PVCs: Holter 12/18 with 41% PVCs.  - PVC ablation in 2/19 with recurrence of PVCs.   SH: Married, lives in [place], works for the [employer], no smoking or ETOH.   Family History  Problem Relation Age of Onset  . Heart attack Father   . Arrhythmia Mother   . Lung cancer Mother        bronchiectasis  . Colon cancer Neg Hx   . Colon polyps Neg Hx    ROS: All systems reviewed and negative except as per HPI.   Current Outpatient  Medications on File Prior to Encounter  Medication Sig Dispense Refill  . buPROPion (WELLBUTRIN XL) 300 MG 24 hr tablet Take 300 mg by mouth daily.    . Calcium Carb-Cholecalciferol (CALCIUM + D3 PO) Take 1 tablet by mouth every morning.    . Coenzyme Q10 (COQ10) 200 MG CAPS Take 200 mg by mouth daily.    . fexofenadine (ALLEGRA) 180 MG tablet Take 180 mg by mouth daily.    . furosemide (LASIX) 20 MG tablet Take 1 tablet (20 mg total) by mouth daily as needed. (Patient taking differently: Take 20 mg by mouth daily as needed for fluid. ) 30 tablet 4  . losartan (COZAAR) 25 MG tablet Take 0.5 tablets (12.5 mg total) by mouth at bedtime. 15 tablet 3  . pantoprazole (PROTONIX) 40 MG tablet Take 1 tablet (40 mg total) by mouth 2 (two) times daily before a meal.  180 tablet 1  . potassium chloride SA (KLOR-CON) 20 MEQ tablet Take 40 mEq by mouth every other day.     . rivaroxaban (XARELTO) 20 MG TABS tablet TAKE 1 TABLET EVERY DAY WITH SUPPER (Patient taking differently: Take 20 mg by mouth daily with supper. ) 90 tablet 2  . rosuvastatin (CRESTOR) 10 MG tablet Take 1 tablet (10 mg total) by mouth daily. 90 tablet 3  . sotalol (BETAPACE) 160 MG tablet TAKE 1 TABLET BY MOUTH TWICE DAILY (Patient taking differently: Take 160 mg by mouth 2 (two) times daily. ) 60 tablet 6  . spironolactone (ALDACTONE) 25 MG tablet Take 0.5 tablets (12.5 mg total) by mouth daily. 15 tablet 3  . SYNTHROID 112 MCG tablet Take 112 mcg by mouth daily before breakfast.   5  . traZODone (DESYREL) 50 MG tablet Take 0.5 tablets (25 mg total) by mouth at bedtime as needed for sleep. 30 tablet 0  . triamcinolone (NASACORT ALLERGY 24HR) 55 MCG/ACT AERO nasal inhaler Place 2 sprays into the nose daily.     . nitroGLYCERIN (NITROSTAT) 0.4 MG SL tablet Place 1 tablet (0.4 mg total) under the tongue every 5 (five) minutes as needed for chest pain. (Patient not taking: Reported on 08/01/2019) 25 tablet 3   Current Facility-Administered  Medications on File Prior to Encounter  Medication Dose Route Frequency Provider Last Rate Last Admin  . bivalirudin (ANGIOMAX) 250 mg in sodium chloride 0.9 % 50 mL (5 mg/mL) infusion    Continuous PRN Kathleene Hazel, MD   Stopped at 09/20/15 [phone]  . bivalirudin (ANGIOMAX) BOLUS via infusion    PRN Kathleene Hazel, MD   54.45 mg at 09/20/15 2841  . fentaNYL (SUBLIMAZE) injection    PRN Kathleene Hazel, MD   25 mcg at 09/20/15 0651  . heparin 1,500 mL    PRN Kathleene Hazel, MD   Given at 09/20/15 [phone]  . heparin infusion 2 units/mL in 0.9 % sodium chloride    Continuous PRN Kathleene Hazel, MD   1,500 mL at 09/20/15 0733  . iopamidol (ISOVUE-370) 76 % injection    PRN Kathleene Hazel, MD   165 mL at 09/20/15 0733  . lidocaine (PF) (XYLOCAINE) 1 % injection    PRN Kathleene Hazel, MD   2 mL at 09/20/15 0102  . midazolam (VERSED) injection    PRN Kathleene Hazel, MD   1 mg at 09/20/15 0651  . nitroGLYCERIN 1 mg/10 ml (100 mcg/ml) - IR/CATH LAB    PRN Kathleene Hazel, MD   200 mcg at 09/20/15 7253  . Radial Cocktail/Verapamil only    PRN Kathleene Hazel, MD   10 mL at 09/20/15 0629  . ticagrelor (BRILINTA) tablet    PRN Kathleene Hazel, MD   180 mg at 09/20/15 [phone]  . tirofiban (AGGRASTAT) bolus via infusion    PRN Kathleene Hazel, MD   1,815 mcg at 09/20/15 [phone]  . tirofiban (AGGRASTAT) infusion 50 mcg/mL 100 mL    Continuous PRN Kathleene Hazel, MD 13.1 mL/hr at 09/20/15 0651 0.15 mcg/kg/min at 09/20/15 0651   Vital Signs  BP 110/78   Pulse (!) 50   Wt 68.1 kg (150 lb 3.2 oz)   SpO2 98%   BMI 22.18 kg/m  PHYSICAL EXAM: General:  Well appearing. No respiratory difficulty HEENT: normal Neck: supple. no JVD. Carotids 2+ bilat; no bruits. No lymphadenopathy or thyromegaly appreciated. Cor: PMI nondisplaced. Regular rate &  rhythm. No rubs, gallops or murmurs. Lungs: clear Abdomen: soft,  nontender, nondistended. No hepatosplenomegaly. No bruits or masses. Good bowel sounds. Extremities: no cyanosis, clubbing, rash, edema Neuro: alert & oriented x 3, cranial nerves grossly intact. moves all 4 extremities w/o difficulty. Affect pleasant.  Assessment/Plan: 1. CAD: S/p anterior MI in 7/17 with DES to RCA.  Cath in 10/19 with no obstructive CAD.  - stable w/o CP  - She is off Plavix now and taking only Xarelto 20 mg daily.  - She is tolerating Crestor without myalgias. Check FLP and HFTs today.  2. Chronic systolic CHF: Ischemic cardiomyopathy.  EF 25% on echo in 10/17, 30-35% on echo in 11/17, 30% on TEE in 8/18. She has extensive LAD-territory scar. She has a [employer] ICD.  Echo in 12/20 showed that EF remains 30% with LAD-territory scar.    - NYHA class II symptoms w/ mild volume overload based on Optivol  - Increase lasix to 20 mg daily x 3 days, then return to qod dosing - Off Coreg with low BP and addition of sotalol.   - Continue losartan to 12.5 mg qhs.   - She did not tolerate dapagliflozin due to yeast infections.   - Continue spironolactone to 12.5 mg daily.  - no room for med titration given soft BP and prior issues w/ dizziness  - QRS does not appear to be wide enough that she would have benefit from CRT.  - Check BMET today.  3. Mitral regurgitation: Moderate to severe on 10/17 echo, severe on 11/17 echo.  I did a TEE in 8/18.  This showed moderate MR that appeared to be functional.  No indication for surgery or Mitraclip. Echo 12/20 showed moderate MR, functional.  4. Atrial fibrillation: Unable to tolerate amiodarone due to GI side effects.  She is in sinus brady today on Xarelto 20 mg daily and sotalol. QTc interval is acceptable on sotalol on today's ECG. W/ HR in the mid 40s, will reduce sotalol to 160 mg qam and 80 mg qpm.  - She is on Xarelto but undergoing GI w/u.  EGD unremarkable.  Scheduled for colonoscopy next week. Remains on Xarelto currently but may do  better eventually on apixaban given GI bleeding.   5. PVCs: Very frequent on 12/18 holter (41% beats), she has had a PVC ablation in 2/19. Due to recurrence of PVCs post-ablation, sotalol was increased to 160 mg bid. She does not seem to have many PVCs but bradycardic w/ HR in the mid 40s. QTc ok on ECG today.  - reduce sotalol to 160 mg qam and 80 mg qhs - repeat EKG in 1 week  6. Hyperlipidemia: Tolerating Crestor, good lipids in 9/20. Repeat FLP and HFTs today. LDL goal < 70 7. Fe deficiency anemia: Associated with dark stool/melena.   She has been getting IV Fe. Now on PO Fe. EGD unremarkable. Colonoscopy scheduled for next week. As above, with GI bleeding, transition to Eliquis eventually may be beneficial. Will wait to see what colonoscopy reveals.  - Check CBC today.   Return in 1 week for repeat EKG w/ RN w/ sotalol dose reduction for bradycardia. Provider f/u in 4-6 weeks.   Lyda Jester, PA-C  08/01/2019

## 2019-08-05 NOTE — Pre-Procedure Instructions (Signed)
Form faxed to device clinic and intraoffice note sent to [facility] for cardiac clearance.

## 2019-08-05 NOTE — Patient Instructions (Signed)
Catherine Mays  08/05/2019     @PREFPERIOPPHARMACY @   Your procedure is scheduled on  08/10/2019 .  Report to [name] at  1245  P.M.  Call this number if you have problems the morning of surgery:  [phone]   Remember:  Follow the diet and prep instructions given to you by Dr Roseanne Kaufman office.                       Take these medicines the morning of surgery with A SIP OF WATER  Wellbutrin, protonix, sotalol, synthroiod.    Do not wear jewelry, make-up or nail polish.  Do not wear lotions, powders, or perfumes Please wear deodorant and brush your teeth.  Men may shave face and neck.  Do not bring valuables to the hospital.  [facility] is not responsible for any belongings or valuables.  Contacts, dentures or bridgework may not be worn into surgery.  Leave your suitcase in the car.  After surgery it may be brought to your room.  For patients admitted to the hospital, discharge time will be determined by your treatment team.  Patients discharged the day of surgery will not be allowed to drive home.   Name and phone number of your driver:   family Special instructions:  DO NOT smoke the morning of your procedure.  Please read over the following fact sheets that you were given. Anesthesia Post-op Instructions and Care and Recovery After Surgery       Colonoscopy, Adult, Care After This sheet gives you information about how to care for yourself after your procedure. Your health care provider may also give you more specific instructions. If you have problems or questions, contact your health care provider. What can I expect after the procedure? After the procedure, it is common to have:  A small amount of blood in your stool for 24 hours after the procedure.  Some gas.  Mild cramping or bloating of your abdomen. Follow these instructions at home: Eating and drinking   Drink enough fluid to keep your urine pale yellow.  Follow instructions from your health  care provider about eating or drinking restrictions.  Resume your normal diet as instructed by your health care provider. Avoid heavy or fried foods that are hard to digest. Activity  Rest as told by your health care provider.  Avoid sitting for a long time without moving. Get up to take short walks every 1-2 hours. This is important to improve blood flow and breathing. Ask for help if you feel weak or unsteady.  Return to your normal activities as told by your health care provider. Ask your health care provider what activities are safe for you. Managing cramping and bloating   Try walking around when you have cramps or feel bloated.  Apply heat to your abdomen as told by your health care provider. Use the heat source that your health care provider recommends, such as a moist heat pack or a heating pad. ? Place a towel between your skin and the heat source. ? Leave the heat on for 20-30 minutes. ? Remove the heat if your skin turns bright red. This is especially important if you are unable to feel pain, heat, or cold. You may have a greater risk of getting burned. General instructions  For the first 24 hours after the procedure: ? Do not drive or use machinery. ? Do not sign important documents. ? Do not drink  alcohol. ? Do your regular daily activities at a slower pace than normal. ? Eat soft foods that are easy to digest.  Take over-the-counter and prescription medicines only as told by your health care provider.  Keep all follow-up visits as told by your health care provider. This is important. Contact a health care provider if:  You have blood in your stool 2-3 days after the procedure. Get help right away if you have:  More than a small spotting of blood in your stool.  Large blood clots in your stool.  Swelling of your abdomen.  Nausea or vomiting.  A fever.  Increasing pain in your abdomen that is not relieved with medicine. Summary  After the procedure, it is  common to have a small amount of blood in your stool. You may also have mild cramping and bloating of your abdomen.  For the first 24 hours after the procedure, do not drive or use machinery, sign important documents, or drink alcohol.  Get help right away if you have a lot of blood in your stool, nausea or vomiting, a fever, or increased pain in your abdomen. This information is not intended to replace advice given to you by your health care provider. Make sure you discuss any questions you have with your health care provider. Document Revised: 09/06/2018 Document Reviewed: 09/06/2018 Elsevier Patient Education  2020 Else[employer]. Monitored Anesthesia Care, Care After These instructions provide you with information about caring for yourself after your procedure. Your health care provider may also give you more specific instructions. Your treatment has been planned according to current medical practices, but problems sometimes occur. Call your health care provider if you have any problems or questions after your procedure. What can I expect after the procedure? After your procedure, you may:  Feel sleepy for several hours.  Feel clumsy and have poor balance for several hours.  Feel forgetful about what happened after the procedure.  Have poor judgment for several hours.  Feel nauseous or vomit.  Have a sore throat if you had a breathing tube during the procedure. Follow these instructions at home: For at least 24 hours after the procedure:      Have a responsible adult stay with you. It is important to have someone help care for you until you are awake and alert.  Rest as needed.  Do not: ? Participate in activities in which you could fall or become injured. ? Drive. ? Use heavy machinery. ? Drink alcohol. ? Take sleeping pills or medicines that cause drowsiness. ? Make important decisions or sign legal documents. ? Take care of children on your own. Eating and  drinking  Follow the diet that is recommended by your health care provider.  If you vomit, drink water, juice, or soup when you can drink without vomiting.  Make sure you have little or no nausea before eating solid foods. General instructions  Take over-the-counter and prescription medicines only as told by your health care provider.  If you have sleep apnea, surgery and certain medicines can increase your risk for breathing problems. Follow instructions from your health care provider about wearing your sleep device: ? Anytime you are sleeping, including during daytime naps. ? While taking prescription pain medicines, sleeping medicines, or medicines that make you drowsy.  If you smoke, do not smoke without supervision.  Keep all follow-up visits as told by your health care provider. This is important. Contact a health care provider if:  You keep feeling nauseous or  you keep vomiting.  You feel light-headed.  You develop a rash.  You have a fever. Get help right away if:  You have trouble breathing. Summary  For several hours after your procedure, you may feel sleepy and have poor judgment.  Have a responsible adult stay with you for at least 24 hours or until you are awake and alert. This information is not intended to replace advice given to you by your health care provider. Make sure you discuss any questions you have with your health care provider. Document Revised: 05/11/2017 Document Reviewed: 06/03/2015 Elsevier Patient Education  [place].

## 2019-08-08 ENCOUNTER — Ambulatory Visit (HOSPITAL_COMMUNITY)
Admission: RE | Admit: 2019-08-08 | Discharge: 2019-08-08 | Disposition: A | Discharge status: Home / Self Care | Payer: BC Managed Care – PPO | Source: Ambulatory Visit | Attending: Internal Medicine | Admitting: Internal Medicine

## 2019-08-08 ENCOUNTER — Other Ambulatory Visit (HOSPITAL_COMMUNITY)
Admission: RE | Admit: 2019-08-08 | Discharge: 2019-08-08 | Disposition: A | Discharge status: Home / Self Care | Payer: BC Managed Care – PPO | Source: Ambulatory Visit | Attending: Internal Medicine | Admitting: Internal Medicine

## 2019-08-08 ENCOUNTER — Encounter (HOSPITAL_COMMUNITY): Payer: Self-pay

## 2019-08-08 ENCOUNTER — Other Ambulatory Visit: Payer: Self-pay

## 2019-08-08 ENCOUNTER — Encounter (HOSPITAL_COMMUNITY)
Admission: RE | Admit: 2019-08-08 | Discharge: 2019-08-08 | Disposition: A | Discharge status: Home / Self Care | Payer: BC Managed Care – PPO | Source: Ambulatory Visit | Attending: Internal Medicine | Admitting: Internal Medicine

## 2019-08-08 VITALS — BP 100/92 | HR 60 | Wt 147.6 lb

## 2019-08-08 DIAGNOSIS — Z20822 Contact with and (suspected) exposure to covid-19: Secondary | ICD-10-CM | POA: Insufficient documentation

## 2019-08-08 DIAGNOSIS — Z01812 Encounter for preprocedural laboratory examination: Secondary | ICD-10-CM | POA: Insufficient documentation

## 2019-08-08 DIAGNOSIS — I480 Paroxysmal atrial fibrillation: Secondary | ICD-10-CM | POA: Insufficient documentation

## 2019-08-09 ENCOUNTER — Encounter: Payer: Self-pay | Admitting: Cardiology

## 2019-08-09 LAB — SARS CORONAVIRUS 2 (TAT 6-24 HRS): SARS Coronavirus 2: NEGATIVE

## 2019-08-09 NOTE — Progress Notes (Signed)
PERIOPERATIVE PRESCRIPTION FOR IMPLANTED CARDIAC DEVICE PROGRAMMING*  Patient Information: Name:  Catherine Mays  DOB:  [birth year]  MRN:  [id number]  Planned Procedure:  Colonoscopy Surgeon:  Dr. Jena Gauss Date of Procedure:  08/10/2019 Cautery will be used. Position during surgery:    Please send documentation back to: [facility] (Fax # [phone])  *Data entered from physical form received via fax. Unable to get fax to go through due to "no answer."  Nigel Sloop, RN  08/09/2019 7:17 PM   Device Information:  Clinic EP Physician:  Loman Brooklyn, MD  Device Type:  Defibrillator Manufacturer and Phone #:  St. Jude/[employer]: [phone] Pacemaker Dependent?:  No. Date of Last Device Check:  06/21/2019 Normal Device Function?:  Yes.    Electrophysiologist's Recommendations:   Have magnet available.  Provide continuous ECG monitoring when magnet is used or reprogramming is to be performed.   Procedure may interfere with device function.  Magnet should be placed over device during procedure.  Per Device Clinic [address] Nigel Sloop, RN  7:17 PM 08/09/2019

## 2019-08-10 ENCOUNTER — Ambulatory Visit (HOSPITAL_COMMUNITY)
Admission: RE | Admit: 2019-08-10 | Discharge: 2019-08-10 | Disposition: A | Discharge status: Home / Self Care | Payer: BC Managed Care – PPO | Attending: Internal Medicine | Admitting: Internal Medicine

## 2019-08-10 ENCOUNTER — Other Ambulatory Visit: Payer: Self-pay

## 2019-08-10 ENCOUNTER — Ambulatory Visit (HOSPITAL_COMMUNITY): Payer: BC Managed Care – PPO | Admitting: Anesthesiology

## 2019-08-10 ENCOUNTER — Other Ambulatory Visit (HOSPITAL_COMMUNITY): Payer: Self-pay | Admitting: *Deleted

## 2019-08-10 ENCOUNTER — Encounter (HOSPITAL_COMMUNITY)
Admission: RE | Disposition: A | Discharge status: Home / Self Care | Payer: Self-pay | Source: Home / Self Care | Attending: Internal Medicine

## 2019-08-10 ENCOUNTER — Encounter (HOSPITAL_COMMUNITY): Payer: Self-pay | Admitting: Internal Medicine

## 2019-08-10 ENCOUNTER — Telehealth (HOSPITAL_COMMUNITY): Payer: Self-pay | Admitting: Vascular Surgery

## 2019-08-10 DIAGNOSIS — I5022 Chronic systolic (congestive) heart failure: Secondary | ICD-10-CM | POA: Diagnosis not present

## 2019-08-10 DIAGNOSIS — K5730 Diverticulosis of large intestine without perforation or abscess without bleeding: Secondary | ICD-10-CM | POA: Diagnosis not present

## 2019-08-10 DIAGNOSIS — I2510 Atherosclerotic heart disease of native coronary artery without angina pectoris: Secondary | ICD-10-CM | POA: Insufficient documentation

## 2019-08-10 DIAGNOSIS — R7303 Prediabetes: Secondary | ICD-10-CM | POA: Insufficient documentation

## 2019-08-10 DIAGNOSIS — Z7901 Long term (current) use of anticoagulants: Secondary | ICD-10-CM | POA: Diagnosis not present

## 2019-08-10 DIAGNOSIS — R195 Other fecal abnormalities: Secondary | ICD-10-CM

## 2019-08-10 DIAGNOSIS — Z95810 Presence of automatic (implantable) cardiac defibrillator: Secondary | ICD-10-CM | POA: Diagnosis not present

## 2019-08-10 DIAGNOSIS — I480 Paroxysmal atrial fibrillation: Secondary | ICD-10-CM | POA: Diagnosis not present

## 2019-08-10 DIAGNOSIS — Z79890 Hormone replacement therapy (postmenopausal): Secondary | ICD-10-CM | POA: Diagnosis not present

## 2019-08-10 DIAGNOSIS — N183 Chronic kidney disease, stage 3 unspecified: Secondary | ICD-10-CM | POA: Diagnosis not present

## 2019-08-10 DIAGNOSIS — E039 Hypothyroidism, unspecified: Secondary | ICD-10-CM | POA: Diagnosis not present

## 2019-08-10 DIAGNOSIS — I255 Ischemic cardiomyopathy: Secondary | ICD-10-CM | POA: Insufficient documentation

## 2019-08-10 DIAGNOSIS — Z79899 Other long term (current) drug therapy: Secondary | ICD-10-CM | POA: Insufficient documentation

## 2019-08-10 DIAGNOSIS — D509 Iron deficiency anemia, unspecified: Secondary | ICD-10-CM

## 2019-08-10 DIAGNOSIS — I493 Ventricular premature depolarization: Secondary | ICD-10-CM | POA: Diagnosis not present

## 2019-08-10 DIAGNOSIS — I252 Old myocardial infarction: Secondary | ICD-10-CM | POA: Insufficient documentation

## 2019-08-10 DIAGNOSIS — K640 First degree hemorrhoids: Secondary | ICD-10-CM | POA: Diagnosis not present

## 2019-08-10 HISTORY — PX: COLONOSCOPY WITH PROPOFOL: SHX5780

## 2019-08-10 SURGERY — COLONOSCOPY WITH PROPOFOL
Anesthesia: General

## 2019-08-10 MED ORDER — EPHEDRINE 5 MG/ML INJ
INTRAVENOUS | Status: AC
  Filled 2019-08-10: qty 10

## 2019-08-10 MED ORDER — PHENYLEPHRINE 40 MCG/ML (10ML) SYRINGE FOR IV PUSH (FOR BLOOD PRESSURE SUPPORT)
PREFILLED_SYRINGE | INTRAVENOUS | Status: AC
  Filled 2019-08-10: qty 10

## 2019-08-10 MED ORDER — CHLORHEXIDINE GLUCONATE CLOTH 2 % EX PADS: 6.0000 | MEDICATED_PAD | Freq: Once | CUTANEOUS | Status: DC

## 2019-08-10 MED ORDER — LACTATED RINGERS IV SOLN: INTRAVENOUS | Status: DC

## 2019-08-10 MED ORDER — PHENYLEPHRINE HCL (PRESSORS) 10 MG/ML IV SOLN
INTRAVENOUS | Status: DC | PRN
  Administered 2019-08-10: 100 ug via INTRAVENOUS

## 2019-08-10 MED ORDER — LIDOCAINE HCL (CARDIAC) PF 100 MG/5ML IV SOSY
PREFILLED_SYRINGE | INTRAVENOUS | Status: DC | PRN
  Administered 2019-08-10: 100 mg via INTRAVENOUS

## 2019-08-10 MED ORDER — SOTALOL HCL 160 MG PO TABS: ORAL_TABLET | ORAL | 6 refills | Status: DC

## 2019-08-10 MED ORDER — LIDOCAINE 2% (20 MG/ML) 5 ML SYRINGE
INTRAMUSCULAR | Status: AC
  Filled 2019-08-10: qty 5

## 2019-08-10 MED ORDER — PROPOFOL 500 MG/50ML IV EMUL
INTRAVENOUS | Status: DC | PRN
  Administered 2019-08-10: 30 mg via INTRAVENOUS
  Administered 2019-08-10: 100 mg via INTRAVENOUS
  Administered 2019-08-10 (×2): 30 mg via INTRAVENOUS

## 2019-08-10 MED ORDER — EPHEDRINE SULFATE 50 MG/ML IJ SOLN
INTRAMUSCULAR | Status: DC | PRN
  Administered 2019-08-10: 10 mg via INTRAVENOUS
  Administered 2019-08-10: 5 mg via INTRAVENOUS

## 2019-08-10 NOTE — Discharge Instructions (Addendum)
Diverticulosis  Diverticulosis is a condition that develops when small pouches (diverticula) form in the wall of the large intestine (colon). The colon is where water is absorbed and stool (feces) is formed. The pouches form when the inside layer of the colon pushes through weak spots in the outer layers of the colon. You may have a few pouches or many of them. The pouches usually do not cause problems unless they become inflamed or infected. When this happens, the condition is called diverticulitis. What are the causes? The cause of this condition is not known. What increases the risk? The following factors may make you more likely to develop this condition:  Being older than age 18. Your risk for this condition increases with age. Diverticulosis is rare among people younger than age 47. By age 73, many people have it.  Eating a low-fiber diet.  Having frequent constipation.  Being overweight.  Not getting enough exercise.  Smoking.  Taking over-the-counter pain medicines, like aspirin and ibuprofen.  Having a family history of diverticulosis. What are the signs or symptoms? In most people, there are no symptoms of this condition. If you do have symptoms, they may include:  Bloating.  Cramps in the abdomen.  Constipation or diarrhea.  Pain in the lower left side of the abdomen. How is this diagnosed? Because diverticulosis usually has no symptoms, it is most often diagnosed during an exam for other colon problems. The condition may be diagnosed by:  Using a flexible scope to examine the colon (colonoscopy).  Taking an X-ray of the colon after dye has been put into the colon (barium enema).  Having a CT scan. How is this treated? You may not need treatment for this condition. Your health care provider may recommend treatment to prevent problems. You may need treatment if you have symptoms or if you previously had diverticulitis. Treatment may include:  Eating a high-fiber  diet.  Taking a fiber supplement.  Taking a live bacteria supplement (probiotic).  Taking medicine to relax your colon. Follow these instructions at home: Medicines  Take over-the-counter and prescription medicines only as told by your health care provider.  If told by your health care provider, take a fiber supplement or probiotic. Constipation prevention Your condition may cause constipation. To prevent or treat constipation, you may need to:  Drink enough fluid to keep your urine pale yellow.  Take over-the-counter or prescription medicines.  Eat foods that are high in fiber, such as beans, whole grains, and fresh fruits and vegetables.  Limit foods that are high in fat and processed sugars, such as fried or sweet foods.  General instructions  Try not to strain when you have a bowel movement.  Keep all follow-up visits as told by your health care provider. This is important. Contact a health care provider if you:  Have pain in your abdomen.  Have bloating.  Have cramps.  Have not had a bowel movement in 3 days. Get help right away if:  Your pain gets worse.  Your bloating becomes very bad.  You have a fever or chills, and your symptoms suddenly get worse.  You vomit.  You have bowel movements that are bloody or black.  You have bleeding from your rectum. Summary  Diverticulosis is a condition that develops when small pouches (diverticula) form in the wall of the large intestine (colon).  You may have a few pouches or many of them.  This condition is most often diagnosed during an exam for other colon  problems.  Treatment may include increasing the fiber in your diet, taking supplements, or taking medicines. This information is not intended to replace advice given to you by your health care provider. Make sure you discuss any questions you have with your health care provider. Document Revised: 09/09/2018 Document Reviewed: 09/09/2018 Elsevier Patient  Education  [employer].     Hemorrhoids Hemorrhoids are swollen veins that may develop:  In the butt (rectum). These are called internal hemorrhoids.  Around the opening of the butt (anus). These are called external hemorrhoids. Hemorrhoids can cause pain, itching, or bleeding. Most of the time, they do not cause serious problems. They usually get better with diet changes, lifestyle changes, and other home treatments. What are the causes? This condition may be caused by:  Having trouble pooping (constipation).  Pushing hard (straining) to poop.  Watery poop (diarrhea).  Pregnancy.  Being very overweight (obese).  Sitting for long periods of time.  Heavy lifting or other activity that causes you to strain.  Anal sex.  Riding a bike for a long period of time. What are the signs or symptoms? Symptoms of this condition include:  Pain.  Itching or soreness in the butt.  Bleeding from the butt.  Leaking poop.  Swelling in the area.  One or more lumps around the opening of your butt. How is this diagnosed? A doctor can often diagnose this condition by looking at the affected area. The doctor may also:  Do an exam that involves feeling the area with a gloved hand (digital rectal exam).  Examine the area inside your butt using a small tube (anoscope).  Order blood tests. This may be done if you have lost a lot of blood.  Have you get a test that involves looking inside the colon using a flexible tube with a camera on the end (sigmoidoscopy or colonoscopy). How is this treated? This condition can usually be treated at home. Your doctor may tell you to change what you eat, make lifestyle changes, or try home treatments. If these do not help, procedures can be done to remove the hemorrhoids or make them smaller. These may involve:  Placing rubber bands at the base of the hemorrhoids to cut off their blood supply.  Injecting medicine into the hemorrhoids to  shrink them.  Shining a type of light energy onto the hemorrhoids to cause them to fall off.  Doing surgery to remove the hemorrhoids or cut off their blood supply. Follow these instructions at home: Eating and drinking   Eat foods that have a lot of fiber in them. These include whole grains, beans, nuts, fruits, and vegetables.  Ask your doctor about taking products that have added fiber (fibersupplements).  Reduce the amount of fat in your diet. You can do this by: ? Eating low-fat dairy products. ? Eating less red meat. ? Avoiding processed foods.  Drink enough fluid to keep your pee (urine) pale yellow. Managing pain and swelling   Take a warm-water bath (sitz bath) for 20 minutes to ease pain. Do this 3-4 times a day. You may do this in a bathtub or using a portable sitz bath that fits over the toilet.  If told, put ice on the painful area. It may be helpful to use ice between your warm baths. ? Put ice in a plastic bag. ? Place a towel between your skin and the bag. ? Leave the ice on for 20 minutes, 2-3 times a day. General instructions  Take  over-the-counter and prescription medicines only as told by your doctor. ? Medicated creams and medicines may be used as told.  Exercise often. Ask your doctor how much and what kind of exercise is best for you.  Go to the bathroom when you have the urge to poop. Do not wait.  Avoid pushing too hard when you poop.  Keep your butt dry and clean. Use wet toilet paper or moist towelettes after pooping.  Do not sit on the toilet for a long time.  Keep all follow-up visits as told by your doctor. This is important. Contact a doctor if you:  Have pain and swelling that do not get better with treatment or medicine.  Have trouble pooping.  Cannot poop.  Have pain or swelling outside the area of the hemorrhoids. Get help right away if you have:  Bleeding that will not stop. Summary  Hemorrhoids are swollen veins in the  butt or around the opening of the butt.  They can cause pain, itching, or bleeding.  Eat foods that have a lot of fiber in them. These include whole grains, beans, nuts, fruits, and vegetables.  Take a warm-water bath (sitz bath) for 20 minutes to ease pain. Do this 3-4 times a day. This information is not intended to replace advice given to you by your health care provider. Make sure you discuss any questions you have with your health care provider. Document Revised: 02/18/2018 Document Reviewed: 07/02/2017 Elsevier Patient Education  2020 [employer]. Diverticulitis  Diverticulitis is when small pockets in your large intestine (colon) get infected or swollen. This causes stomach pain and watery poop (diarrhea). These pouches are called diverticula. They form in people who have a condition called diverticulosis. Follow these instructions at home: Medicines  Take over-the-counter and prescription medicines only as told by your doctor. These include: ? Antibiotics. ? Pain medicines. ? Fiber pills. ? Probiotics. ? Stool softeners.  Do not drive or use heavy machinery while taking prescription pain medicine.  If you were prescribed an antibiotic, take it as told. Do not stop taking it even if you feel better. General instructions   Follow a diet as told by your doctor.  When you feel better, your doctor may tell you to change your diet. You may need to eat a lot of fiber. Fiber makes it easier to poop (have bowel movements). Healthy foods with fiber include: ? Berries. ? Beans. ? Lentils. ? Green vegetables.  Exercise 3 or more times a week. Aim for 30 minutes each time. Exercise enough to sweat and make your heart beat faster.  Keep all follow-up visits as told. This is important. You may need to have an exam of the large intestine. This is called a colonoscopy. Contact a doctor if:  Your pain does not get better.  You have a hard time eating or drinking.  You are not  pooping like normal. Get help right away if:  Your pain gets worse.  Your problems do not get better.  Your problems get worse very fast.  You have a fever.  You throw up (vomit) more than one time.  You have poop that is: ? Bloody. ? Black. ? Tarry. Summary  Diverticulitis is when small pockets in your large intestine (colon) get infected or swollen.  Take medicines only as told by your doctor.  Follow a diet as told by your doctor. This information is not intended to replace advice given to you by your health care provider. Make sure  you discuss any questions you have with your health care provider. Document Revised: 01/23/2017 Document Reviewed: 02/28/2016 Elsevier Patient Education  2020 [employer]  Colonoscopy Discharge Instructions  Read the instructions outlined below and refer to this sheet in the next few weeks. These discharge instructions provide you with general information on caring for yourself after you leave the hospital. Your doctor may also give you specific instructions. While your treatment has been planned according to the most current medical practices available, unavoidable complications occasionally occur. If you have any problems or questions after discharge, call Dr. Jena Gauss at [phone]. ACTIVITY  You may resume your regular activity, but move at a slower pace for the next 24 hours.   Take frequent rest periods for the next 24 hours.   Walking will help get rid of the air and reduce the bloated feeling in your belly (abdomen).   No driving for 24 hours (because of the medicine (anesthesia) used during the test).    Do not sign any important legal documents or operate any machinery for 24 hours (because of the anesthesia used during the test).  NUTRITION  Drink plenty of fluids.   You may resume your normal diet as instructed by your doctor.   Begin with a light meal and progress to your normal diet. Heavy or fried foods are harder to digest  and may make you feel sick to your stomach (nauseated).   Avoid alcoholic beverages for 24 hours or as instructed.  MEDICATIONS  You may resume your normal medications unless your doctor tells you otherwise.  WHAT YOU CAN EXPECT TODAY  Some feelings of bloating in the abdomen.   Passage of more gas than usual.   Spotting of blood in your stool or on the toilet paper.  IF YOU HAD POLYPS REMOVED DURING THE COLONOSCOPY:  No aspirin products for 7 days or as instructed.   No alcohol for 7 days or as instructed.   Eat a soft diet for the next 24 hours.  FINDING OUT THE RESULTS OF YOUR TEST Not all test results are available during your visit. If your test results are not back during the visit, make an appointment with your caregiver to find out the results. Do not assume everything is normal if you have not heard from your caregiver or the medical facility. It is important for you to follow up on all of your test results.  SEEK IMMEDIATE MEDICAL ATTENTION IF:  You have more than a spotting of blood in your stool.   Your belly is swollen (abdominal distention).   You are nauseated or vomiting.   You have a temperature over 101.   You have abdominal pain or discomfort that is severe or gets worse throughout the day.   Diverticulosis and hemorrhoid information provided  Colon looked really good today  Recommend repeat colonoscopy for screening purposes in 10 years  Discrepancy on sotalol dosing in the medical record.  Please check with prescribing cardiologist for the correct dose  Office visit with [place] in 6 weeks  Resume iron and Xarelto tomorrow  At patient request, I called Dian Queen, husband, [phone] and left a voicemail with results.

## 2019-08-10 NOTE — Progress Notes (Signed)
Update from previous note: contacted patient who confirmed she is only taking 80 mg in the evening. Incorrect script has been updated by the office at [facility]

## 2019-08-10 NOTE — Telephone Encounter (Signed)
Left pt message that appt will be changed, asked pt to call back to confirm

## 2019-08-10 NOTE — Anesthesia Postprocedure Evaluation (Signed)
Anesthesia Post Note  Patient: Gordana Kewley  Procedure(s) Performed: COLONOSCOPY WITH PROPOFOL (N/A )  Patient location during evaluation: PACU Anesthesia Type: General Level of consciousness: awake, oriented, awake and alert and patient cooperative Pain management: pain level controlled Vital Signs Assessment: post-procedure vital signs reviewed and stable Respiratory status: spontaneous breathing, respiratory function stable, nonlabored ventilation and patient connected to nasal cannula oxygen Cardiovascular status: blood pressure returned to baseline and stable Postop Assessment: no headache and no backache Anesthetic complications: no   No complications documented.   Last Vitals:  Vitals:   08/10/19 1314 08/10/19 1353  BP: 108/80   Pulse: 61 (P) 60  Resp: 12 (P) 18  Temp:  (P) 36.5 C  SpO2: 100%     Last Pain:  Vitals:   08/10/19 1334  TempSrc:   PainSc: 0-No pain                 Brynda Peon

## 2019-08-10 NOTE — H&P (Signed)
@LOGO @   Primary Care Physician:  Celene Squibb, MD Primary Gastroenterologist:  Dr. Gala Romney  Pre-Procedure History & Physical: HPI:  Catherine Mays is a 54 y.o. female here for for further evaluate shin of iron deficiency anemia, occult blood in stool, chronic anticoagulation via colonoscopy.  EGD a month and half ago demonstrated no evidence of H. pylori or celiac disease. Colonoscopy today per plan to further evaluate blood in stool and iron deficiency anemia.  Past Medical History:  Diagnosis Date  . AICD (automatic cardioverter/defibrillator) present    St. Jude  . CAD in native artery    a. late recognition of presentation of STEMI 08/2015 s/p DES to LAD, mild residual mRCA.  Marland Kitchen Chronic systolic CHF (congestive heart failure) ([place])   . CKD (chronic kidney disease), stage III   . Hypotension    a. preventing med titration for HF.  Marland Kitchen Hypothyroidism 09/24/2015  . Ischemic cardiomyopathy   . Myocardial infarction ([place]) 08/2015  . PAF (paroxysmal atrial fibrillation) ([place]) 09/24/2015   a. dx at time of STEMI 08/2015.  . Pre-diabetes   . Presence of permanent cardiac pacemaker    patient has ICD  . PVC's (premature ventricular contractions)     Past Surgical History:  Procedure Laterality Date  . BIOPSY  06/16/2019   Procedure: BIOPSY;  Surgeon: Daneil Dolin, MD;  [facility];  Service: Endoscopy;;  . CARDIAC CATHETERIZATION N/A 09/20/2015   Procedure: Left Heart Cath and Coronary Angiography;  Surgeon: Burnell Blanks, MD;  Location: [place] [facility];  Service: Cardiovascular;  Laterality: N/A;  . CARDIAC CATHETERIZATION N/A 09/20/2015   Procedure: Coronary Stent Intervention;  Surgeon: Burnell Blanks, MD;  Location: [place] [facility];  Service: Cardiovascular;  Laterality: N/A;  . CARDIAC CATHETERIZATION N/A 09/21/2015   Procedure: Left Heart Cath and Coronary Angiography;  Surgeon: Sherren Mocha, MD;  Location: [place] [facility];  Service:  Cardiovascular;  Laterality: N/A;  . CARDIAC CATHETERIZATION N/A 11/26/2015   Procedure: Right Heart Cath;  Surgeon: Larey Dresser, MD;  Location: [place] [facility];  Service: Cardiovascular;  Laterality: N/A;  . COLONOSCOPY WITH PROPOFOL N/A 11/23/2017   Dr. Gala Romney: Diverticulosis, internal hemorrhoids, next colonoscopy in 10 years  . CORONARY STENT PLACEMENT  09/20/2015  . EP IMPLANTABLE DEVICE N/A 01/23/2016   Procedure: ICD Implant;  Surgeon: Will Meredith Leeds, MD;  Location: [place] [facility];  Service: Cardiovascular;  Laterality: N/A;  . ESOPHAGOGASTRODUODENOSCOPY (EGD) WITH PROPOFOL N/A 06/16/2019   Procedure: ESOPHAGOGASTRODUODENOSCOPY (EGD) WITH PROPOFOL;  Surgeon: Daneil Dolin, MD;  [facility];  Service: Endoscopy;  Laterality: N/A;  8:00am  . LEFT HEART CATH AND CORONARY ANGIOGRAPHY N/A 12/18/2017   Procedure: LEFT HEART CATH AND CORONARY ANGIOGRAPHY;  Surgeon: Larey Dresser, MD;  Location: [place] [facility];  Service: Cardiovascular;  Laterality: N/A;  . PVC ABLATION N/A 04/02/2017   Procedure: PVC ABLATION;  Surgeon: Constance Haw, MD;  Location: [place] [facility];  Service: Cardiovascular;  Laterality: N/A;  . TEE WITHOUT CARDIOVERSION N/A 10/08/2016   Procedure: TRANSESOPHAGEAL ECHOCARDIOGRAM (TEE);  Surgeon: Larey Dresser, MD;  Location: [facility];  Service: Cardiovascular;  Laterality: N/A;  . THYROIDECTOMY      Prior to Admission medications   Medication Sig Start Date End Date Taking? Authorizing Provider  buPROPion (WELLBUTRIN XL) 300 MG 24 hr tablet Take 300 mg by mouth daily. 05/10/19  Yes [provider]  Calcium Carb-Cholecalciferol (CALCIUM +  D3 PO) Take 1 tablet by mouth every morning.   Yes [provider]  Coenzyme Q10 (COQ10) 200 MG CAPS Take 200 mg by mouth daily.   Yes [provider]  fexofenadine (ALLEGRA) 180 MG tablet Take 180 mg by mouth daily.   Yes [provider]  furosemide (LASIX) 20  MG tablet Take 1 tablet (20 mg total) by mouth daily as needed. Patient taking differently: Take 20 mg by mouth daily as needed for fluid.  05/19/19  Yes [name], MD  losartan (COZAAR) 25 MG tablet Take 0.5 tablets (12.5 mg total) by mouth at bedtime. 05/19/19  Yes [name], MD  pantoprazole (PROTONIX) 40 MG tablet Take 1 tablet (40 mg total) by mouth 2 (two) times daily before a meal. 05/23/19  Yes Tiffany Kocher, PA-C  potassium chloride SA (KLOR-CON) 20 MEQ tablet Take 40 mEq by mouth every other day.  02/21/19  Yes [provider]  rivaroxaban (XARELTO) 20 MG TABS tablet TAKE 1 TABLET EVERY DAY WITH SUPPER Patient taking differently: Take 20 mg by mouth daily with supper.  06/22/19  Yes Kathleene Hazel, MD  rosuvastatin (CRESTOR) 10 MG tablet Take 1 tablet (10 mg total) by mouth daily. 09/03/18  Yes [name], MD  sotalol (BETAPACE) 160 MG tablet Take 1 tablet (160 mg total) by mouth in the morning AND 5 tablets (800 mg total) every evening. 08/01/19  Yes Robbie Lis M, PA-C  spironolactone (ALDACTONE) 25 MG tablet Take 0.5 tablets (12.5 mg total) by mouth daily. 05/19/19  Yes [name], MD  SYNTHROID 112 MCG tablet Take 112 mcg by mouth daily before breakfast.  08/25/15  Yes [provider]  traZODone (DESYREL) 50 MG tablet Take 0.5 tablets (25 mg total) by mouth at bedtime as needed for sleep. 11/26/15  Yes Clegg, Amy D, NP  triamcinolone (NASACORT ALLERGY 24HR) 55 MCG/ACT AERO nasal inhaler Place 2 sprays into the nose daily.    Yes [provider]  nitroGLYCERIN (NITROSTAT) 0.4 MG SL tablet Place 1 tablet (0.4 mg total) under the tongue every 5 (five) minutes as needed for chest pain. Patient not taking: Reported on 08/01/2019 08/14/17   Regan Lemming, MD    Allergies as of 07/06/2019 - Review Complete 07/04/2019  Allergen Reaction Noted  . Paxil [paroxetine] Palpitations 11/23/2015  . Morphine and related Nausea And  Vomiting 11/23/2015  . Percocet [oxycodone-acetaminophen] Nausea And Vomiting 11/23/2015  . Cefdinir Nausea Only 11/23/2015  . Zolpidem Other (See Comments) 11/23/2015    Family History  Problem Relation Age of Onset  . Heart attack Father   . Arrhythmia Mother   . Lung cancer Mother        bronchiectasis  . Colon cancer Neg Hx   . Colon polyps Neg Hx     Social History   Socioeconomic History  . Marital status: Married    Spouse name: Not on file  . Number of children: Not on file  . Years of education: Not on file  . Highest education level: Not on file  Occupational History  . Not on file  Tobacco Use  . Smoking status: Never Smoker  . Smokeless tobacco: Never Used  Vaping Use  . Vaping Use: Never used  Substance and Sexual Activity  . Alcohol use: No  . Drug use: No  . Sexual activity: Not on file  Other Topics Concern  . Not on file  Social History Narrative  . Not  on file   Social Determinants of Health   Financial Resource Strain:   . Difficulty of Paying Living Expenses:   Food Insecurity:   . Worried About Programme researcher, broadcasting/film/video in the Last Year:   . Barista in the Last Year:   Transportation Needs:   . Freight forwarder (Medical):   Marland Kitchen Lack of Transportation (Non-Medical):   Physical Activity:   . Days of Exercise per Week:   . Minutes of Exercise per Session:   Stress:   . Feeling of Stress :   Social Connections:   . Frequency of Communication with Friends and Family:   . Frequency of Social Gatherings with Friends and Family:   . Attends Religious Services:   . Active Member of Clubs or Organizations:   . Attends Banker Meetings:   Marland Kitchen Marital Status:   Intimate Partner Violence:   . Fear of Current or Ex-Partner:   . Emotionally Abused:   Marland Kitchen Physically Abused:   . Sexually Abused:     Review of Systems: See HPI, otherwise negative ROS  Physical Exam: BP 108/80   Pulse 61   Temp 97.9 F (36.6 C) (Oral)    Resp 12   Ht 5\' 9"  (1.753 m)   Wt 67 kg   SpO2 100%   BMI 21.80 kg/m  General:   Alert,  Well-developed, well-nourished, pleasant and cooperative in NAD Neck:  Supple; no masses or thyromegaly. No significant cervical adenopathy. Lungs:  Clear throughout to auscultation.   No wheezes, crackles, or rhonchi. No acute distress. Heart:  Regular rate and rhythm; no murmurs, clicks, rubs,  or gallops. Abdomen: Non-distended, normal bowel sounds.  Soft and nontender without appreciable mass or hepatosplenomegaly.  Pulses:  Normal pulses noted. Extremities:  Without clubbing or edema.  Impression/Plan: 54 year old lady with iron deficiency anemia and Hemoccult positive stool.  Chronically anticoagulated.  EGD negative recently.  Hemoglobin 11.9 on June 7.  Xarelto held 3 days ago  The risks, benefits, limitations, alternatives and imponderables have been reviewed with the patient. Questions have been answered. All parties are agreeable.          Notice: This dictation was prepared with Dragon dictation along with smaller phrase technology. Any transcriptional errors that result from this process are unintentional and may not be corrected upon review.

## 2019-08-10 NOTE — Transfer of Care (Signed)
Immediate Anesthesia Transfer of Care Note  Patient: Catherine Mays  Procedure(s) Performed: COLONOSCOPY WITH PROPOFOL (N/A )  Patient Location: PACU  Anesthesia Type:MAC  Level of Consciousness: awake, alert , oriented and patient cooperative  Airway & Oxygen Therapy: Patient Spontanous Breathing and Patient connected to nasal cannula oxygen  Post-op Assessment: Report given to RN, Post -op Vital signs reviewed and stable and Patient moving all extremities  Post vital signs: Reviewed and stable  Last Vitals:  Vitals Value Taken Time  BP    Temp    Pulse    Resp    SpO2      Last Pain:  Vitals:   08/10/19 1334  TempSrc:   PainSc: 0-No pain         Complications: No complications documented.

## 2019-08-10 NOTE — Op Note (Signed)
[facility] Patient Name: Catherine Mays Procedure Date: 08/10/2019 1:21 PM MRN: [id number] Date of Birth: [birth year] Attending MD: Norvel Richards , MD CSN: [id number] Age: 54 Admit Type: Outpatient Procedure:                Colonoscopy Indications:              Heme positive stool, Iron deficiency anemia Providers:                Norvel Richards, MD, Crystal Page, Nelma Rothman,                            Technician Referring MD:              Medicines:                Propofol per Anesthesia Complications:            No immediate complications. Estimated Blood Loss:     Estimated blood loss: none. Procedure:                Pre-Anesthesia Assessment:                           - Prior to the procedure, a History and Physical                            was performed, and patient medications and                            allergies were reviewed. The patient's tolerance of                            previous anesthesia was also reviewed. The risks                            and benefits of the procedure and the sedation                            options and risks were discussed with the patient.                            All questions were answered, and informed consent                            was obtained. Prior Anticoagulants: The patient                            last took Xarelto (rivaroxaban) 3 days prior to the                            procedure. ASA Grade Assessment: III - A patient                            with severe systemic disease. After reviewing the  risks and benefits, the patient was deemed in                            satisfactory condition to undergo the procedure.                           After obtaining informed consent, the colonoscope                            was passed under direct vision. Throughout the                            procedure, the patient's blood pressure, pulse, and                             oxygen saturations were monitored continuously. The                            [id number]-[id number] ([id number]) scope was introduced through                            the anus and advanced to the 10 cm into the ileum.                            The colonoscopy was performed without difficulty.                            The patient tolerated the procedure well. The                            quality of the bowel preparation was adequate. Scope In: 1:39:08 PM Scope Out: 1:51:50 PM Scope Withdrawal Time: 0 hours 8 minutes 5 seconds  Total Procedure Duration: 0 hours 12 minutes 42 seconds  Findings:      The perianal and digital rectal examinations were normal.      Scattered medium-mouthed diverticula were found in the sigmoid colon.       Distal 10 cm of terminal ileum appeared normal.      Non-bleeding internal hemorrhoids were found during retroflexion. The       hemorrhoids were mild, small and Grade I (internal hemorrhoids that do       not prolapse).      The perianal and digital rectal examinations were normal. Impression:               - Diverticulosis in the sigmoid colon.                           - Non-bleeding internal hemorrhoids.                           - No specimens collected. Moderate Sedation:      Moderate (conscious) sedation was personally administered by an       anesthesia professional. The following parameters were monitored: oxygen       saturation, heart rate, blood pressure, respiratory rate, EKG, adequacy       of  pulmonary ventilation, and response to care. Recommendation:           - Patient has a contact number available for                            emergencies. The signs and symptoms of potential                            delayed complications were discussed with the                            patient. Return to normal activities tomorrow.                            Written discharge instructions were provided to the                            patient.                            - Advance diet as tolerated. Resume Xarelto and                            iron today. Office visit with [place] in 6 weeks Procedure Code(s):        --- Professional ---                           [phone], Colonoscopy, flexible; diagnostic, including                            collection of specimen(s) by brushing or washing,                            when performed (separate procedure) Diagnosis Code(s):        --- Professional ---                           K64.0, First degree hemorrhoids                           R19.5, Other fecal abnormalities                           D50.9, Iron deficiency anemia, unspecified                           K57.30, Diverticulosis of large intestine without                            perforation or abscess without bleeding CPT copyright 2019 American Medical Association. All rights reserved. The codes documented in this report are preliminary and upon coder review may  be revised to meet current compliance requirements. Gerrit Friends. Darcella Shiffman, MD Gennette Pac, MD 08/10/2019 2:38:43 PM This report has been signed electronically. Number of Addenda: 0

## 2019-08-10 NOTE — Anesthesia Preprocedure Evaluation (Signed)
Anesthesia Evaluation  Patient identified by MRN, date of birth, ID band Patient awake    Reviewed: Allergy & Precautions, H&P , NPO status , Patient's Chart, lab work & pertinent test results  Airway Mallampati: II  TM Distance: >3 FB Neck ROM: full    Dental no notable dental hx. (+) Teeth Intact   Pulmonary neg pulmonary ROS,    Pulmonary exam normal breath sounds clear to auscultation       Cardiovascular Exercise Tolerance: Good + CAD, + Past MI and +CHF  + pacemaker + Cardiac Defibrillator  Rhythm:regular Rate:Normal     Neuro/Psych negative neurological ROS  negative psych ROS   GI/Hepatic Neg liver ROS, GERD  Medicated,  Endo/Other  Hypothyroidism   Renal/GU CRFRenal disease  negative genitourinary   Musculoskeletal   Abdominal   Peds  Hematology  (+) Blood dyscrasia, anemia ,   Anesthesia Other Findings   Reproductive/Obstetrics negative OB ROS                             Anesthesia Physical Anesthesia Plan  ASA: III  Anesthesia Plan: General   Post-op Pain Management:    Induction:   PONV Risk Score and Plan: 2 and Propofol infusion  Airway Management Planned:   Additional Equipment:   Intra-op Plan:   Post-operative Plan:   Informed Consent: I have reviewed the patients History and Physical, chart, labs and discussed the procedure including the risks, benefits and alternatives for the proposed anesthesia with the patient or authorized representative who has indicated his/her understanding and acceptance.     Dental Advisory Given  Plan Discussed with: CRNA  Anesthesia Plan Comments:         Anesthesia Quick Evaluation

## 2019-08-10 NOTE — Progress Notes (Addendum)
Pharmacy noticed incorrect med rec for sotalol listed as "160 mg in AM and 800 mg PM" that should be 80 mg PM that was sent to [facility]. Informed prescribing office of error and was told they would correct prescription with [facility].  Patient is currently in procedure so unable to inform her of correct dosing at the moment.  6/16 1630: talked to patient who confirms she has been taking 80 mg in the evening.  The office has contacted [facility] and adjusted script to correct dose.

## 2019-08-12 ENCOUNTER — Encounter (HOSPITAL_COMMUNITY): Payer: Self-pay | Admitting: Internal Medicine

## 2019-08-23 ENCOUNTER — Other Ambulatory Visit (HOSPITAL_COMMUNITY): Payer: Self-pay

## 2019-08-23 MED ORDER — ROSUVASTATIN CALCIUM 10 MG PO TABS: 10.0000 mg | ORAL_TABLET | Freq: Every day | ORAL | 3 refills | Status: DC

## 2019-08-23 NOTE — Telephone Encounter (Signed)
Meds ordered this encounter  Medications   rosuvastatin (CRESTOR) 10 MG tablet    Sig: Take 1 tablet (10 mg total) by mouth daily.    Dispense:  90 tablet    Refill:  3    

## 2019-09-09 ENCOUNTER — Other Ambulatory Visit (HOSPITAL_COMMUNITY): Payer: Self-pay | Admitting: Cardiology

## 2019-09-20 ENCOUNTER — Ambulatory Visit (INDEPENDENT_AMBULATORY_CARE_PROVIDER_SITE_OTHER): Payer: BC Managed Care – PPO | Admitting: *Deleted

## 2019-09-20 DIAGNOSIS — I255 Ischemic cardiomyopathy: Secondary | ICD-10-CM | POA: Diagnosis not present

## 2019-09-21 LAB — CUP PACEART REMOTE DEVICE CHECK
Battery Remaining Longevity: 71 mo
Battery Remaining Percentage: 68 %
Battery Voltage: 2.96 V
Brady Statistic RV Percent Paced: 1 %
Date Time Interrogation Session: 20210727020018
HighPow Impedance: 68 Ohm
HighPow Impedance: 68 Ohm
Implantable Lead Implant Date: 20171129
Implantable Lead Location: 753860
Implantable Pulse Generator Implant Date: 20171129
Lead Channel Impedance Value: 330 Ohm
Lead Channel Pacing Threshold Amplitude: 1.25 V
Lead Channel Pacing Threshold Pulse Width: 0.5 ms
Lead Channel Sensing Intrinsic Amplitude: 12 mV
Lead Channel Setting Pacing Amplitude: 2.5 V
Lead Channel Setting Pacing Pulse Width: 0.5 ms
Lead Channel Setting Sensing Sensitivity: 0.5 mV
Pulse Gen Serial Number: 7377983

## 2019-09-22 ENCOUNTER — Other Ambulatory Visit: Payer: Self-pay

## 2019-09-22 ENCOUNTER — Ambulatory Visit (HOSPITAL_COMMUNITY)
Admission: RE | Admit: 2019-09-22 | Discharge: 2019-09-22 | Disposition: A | Discharge status: Home / Self Care | Payer: BC Managed Care – PPO | Source: Ambulatory Visit | Attending: Cardiology | Admitting: Cardiology

## 2019-09-22 ENCOUNTER — Encounter (HOSPITAL_COMMUNITY): Payer: Self-pay | Admitting: Cardiology

## 2019-09-22 ENCOUNTER — Encounter: Payer: Self-pay | Admitting: Cardiology

## 2019-09-22 VITALS — BP 100/68 | HR 65 | Wt 147.4 lb

## 2019-09-22 DIAGNOSIS — I252 Old myocardial infarction: Secondary | ICD-10-CM | POA: Diagnosis not present

## 2019-09-22 DIAGNOSIS — D509 Iron deficiency anemia, unspecified: Secondary | ICD-10-CM | POA: Diagnosis not present

## 2019-09-22 DIAGNOSIS — I2510 Atherosclerotic heart disease of native coronary artery without angina pectoris: Secondary | ICD-10-CM | POA: Diagnosis not present

## 2019-09-22 DIAGNOSIS — I480 Paroxysmal atrial fibrillation: Secondary | ICD-10-CM | POA: Diagnosis not present

## 2019-09-22 DIAGNOSIS — I5022 Chronic systolic (congestive) heart failure: Secondary | ICD-10-CM

## 2019-09-22 DIAGNOSIS — Z8249 Family history of ischemic heart disease and other diseases of the circulatory system: Secondary | ICD-10-CM | POA: Insufficient documentation

## 2019-09-22 DIAGNOSIS — Z95810 Presence of automatic (implantable) cardiac defibrillator: Secondary | ICD-10-CM | POA: Diagnosis not present

## 2019-09-22 DIAGNOSIS — I255 Ischemic cardiomyopathy: Secondary | ICD-10-CM | POA: Insufficient documentation

## 2019-09-22 DIAGNOSIS — E785 Hyperlipidemia, unspecified: Secondary | ICD-10-CM | POA: Diagnosis not present

## 2019-09-22 DIAGNOSIS — Z7901 Long term (current) use of anticoagulants: Secondary | ICD-10-CM | POA: Diagnosis not present

## 2019-09-22 DIAGNOSIS — Z79899 Other long term (current) drug therapy: Secondary | ICD-10-CM | POA: Insufficient documentation

## 2019-09-22 DIAGNOSIS — I493 Ventricular premature depolarization: Secondary | ICD-10-CM | POA: Diagnosis not present

## 2019-09-22 DIAGNOSIS — I340 Nonrheumatic mitral (valve) insufficiency: Secondary | ICD-10-CM | POA: Diagnosis not present

## 2019-09-22 DIAGNOSIS — Z955 Presence of coronary angioplasty implant and graft: Secondary | ICD-10-CM | POA: Diagnosis not present

## 2019-09-22 LAB — CBC
HCT: 35.6 % — ABNORMAL LOW (ref 36.0–46.0)
Hemoglobin: 11.2 g/dL — ABNORMAL LOW (ref 12.0–15.0)
MCH: 30.4 pg (ref 26.0–34.0)
MCHC: 31.5 g/dL (ref 30.0–36.0)
MCV: 96.7 fL (ref 80.0–100.0)
Platelets: 165 10*3/uL (ref 150–400)
RBC: 3.68 MIL/uL — ABNORMAL LOW (ref 3.87–5.11)
RDW: 12.9 % (ref 11.5–15.5)
WBC: 4.9 10*3/uL (ref 4.0–10.5)
nRBC: 0 % (ref 0.0–0.2)

## 2019-09-22 LAB — BASIC METABOLIC PANEL
Anion gap: 9 (ref 5–15)
BUN: 19 mg/dL (ref 6–20)
CO2: 27 mmol/L (ref 22–32)
Calcium: 8.6 mg/dL — ABNORMAL LOW (ref 8.9–10.3)
Chloride: 102 mmol/L (ref 98–111)
Creatinine, Ser: 1.12 mg/dL — ABNORMAL HIGH (ref 0.44–1.00)
GFR calc Af Amer: >60 mL/min (ref >60)
GFR calc non Af Amer: 56 mL/min — ABNORMAL LOW (ref >60)
Glucose, Bld: 131 mg/dL — ABNORMAL HIGH (ref 70–99)
Potassium: 3.8 mmol/L (ref 3.5–5.1)
Sodium: 138 mmol/L (ref 135–145)

## 2019-09-22 MED ORDER — SPIRONOLACTONE 25 MG PO TABS: 25.0000 mg | ORAL_TABLET | Freq: Every day | ORAL | 11 refills | Status: DC

## 2019-09-22 NOTE — Progress Notes (Signed)
PCP: Dr. Margo Aye HF Cardiology: Dr. Shirlee Latch  54 y.o. with history of CAD s/p anterior STEMI with DES to LAD, ischemic cardiomyopathy, mitral regurgitation, and paroxysmal atrial fibrillation presents for followup of CHF and CAD.  She was admitted in 7/17 with anterior STEMI.  She had a late presentation to the cath lab.  Echo in 10/17 showed EF about 25% with LAD-territory wall motion abnormalities.  She had peri-MI atrial fibrillation and was started on Xarelto.  She was put on amiodarone.   After discharge, she continued to feel poorly.  She developed nausea, anorexia, and significant fatigue.  She was admitted for evaluation in 10/17 given concern for low output heart failure.  However, stopping amiodarone essentially resolved her symptoms.  She had RHC showing preserved cardiac output but the PCWP was high due to prominent v-waves, presumably from significant mitral regurgitation.  Of note, she was in atrial fibrillation for a time during this admission.   She had St Jude ICD placed.  She is back at work for the [employer].   TEE in 8/18 to evaluate the mitral valve showed EF 30%, moderate MR (probably functional).    Patient was seen by Dr. Elberta Fortis and noted to have very frequent PVCs on device interrogation.  Holter in 12/18 showed 41% PVCs.  She was started on sotalol 80 mg bid and titrated up to 120 mg bid.  Coreg was stopped with the addition of sotalol and losartan was decreased to 12.5 mg daily due to low BP.  She had PVC ablation in 2/19 but PVCs recurred. Sotalol was increased to 160 mg bid.   Echo 8/19 showed EF 30% with regional wall motion abnormalities, normal RV size and systolic function, moderate MR.   She was admitted in 10/19 with chest pain.  Cath showed patent LAD stent and no obstructive CAD.   Echo was done today and reviewed, EF 30% with LAD wall motion abnormalities, normal RV, moderate MR.   I started her on Farxiga but she had to stop it after developing a  chronic yeast infection.   She was found to be anemic with Fe deficiency (hgb down to 8).  She has had IV Fe and blood transfusion.  EGD and colonoscopy this year were fairly unremarkable.  She had diverticulosis.  Hgb up to 11.9 last check.   She feels better with hgb higher. Doing well today, no significant exertional dyspnea.  No chest pain.  No orthopnea/PND.  She has been taking Lasix 20 mg every day.  Occasional lightheadedness if she stands too fast.  Weight down 3 lbs.   ECG (personally reviewed): NSR at 55, IVCD 134 msec, QTc 432 msec.   St Jude ICD interrogation: No VT, rare V-pacing, stable thoracic impedance.    Labs (10/17): K 3.9, creatinine 1.24, hgb 10.3 Labs (11/17): K 4, creatinine 1.27 Labs (12/17): K 4.2, creatinine 1.12, BNP 439 Labs (2/18): K 4.7, creatinine 1.37 Labs (7/18): K 3.9, creatinine 1.14, hgb 11.7 Labs (12/18): K 4.5, creatinine 1.23 Labs (1/19): K 4.4, creatinine 1.14 Labs (2/19): LDL 97 (off atorvastatin), HDL 56 Labs (5/19): K 4.3, creatinine 1.1 Labs (10/19): K 4.2, creatinine 1.2, LDL 70, hgb 11.8 Labs (11/19): K 4.9, creatinine 1.17, hgb 11.8, LDL 68, low TSH  Labs (9/20): K 4.4, creatinine 1.34, LDL 52 Labs (12/20): K 4.6, creatinine 1.19 Labs (6/21): LDL 73, HDL 62, hgb 11.9, K 4.2, creatinine 1.14  PMH:  1. CAD: Anterior STEMI in 7/17 with late presentation to the  cath lab. She had DES to proximal LAD.  No significant disease in other vessels.  - LHC (10/19): Patent LAD stent, no obstructive disease.  2. Chronic systolic CHF: Ischemic cardiomyopathy.  - Echo 10/17 with EF 25%, akinesis of the mid-apical anteroseptal and anterior walls, apical dyskinesis, moderate to severe MR with incomplete coaptation.  - RHC (10/17): mean RA 4, PA 59/17 mean 38, mean PCWP 27 with prominent V waves suggesting significant MR, CI 3.06, PVR 2 WU.  - ACEI cough.  - Echo (11/17): EF 30-35%, normal RV size and systolic function, severe MR with restriction of  posterior leaflet.   - Echo (8/19): EF 30% with regional wall motion abnormalities, normal RV size and systolic function, moderate MR.  - Echo (12/20): EF 30%, LAD wall motion abnormalities, normal RV, PASP 35, moderate functional MR.  3. Atrial fibrillation: Paroxysmal.  Noted 7/17 admission peri-MI, also noted again 10/17 admission. She did not tolerate amiodarone due to nausea/anorexia.  4. Mitral regurgitation: Moderate to severe on 10/17 echo, incomplete coaptation.  ?Etiology, her MI (anterior) does not typically lead to ischemic MR. - Echo in 11/17 with severe MR, posterior leaflet restricted.  - TEE (8/18): EF 30%, moderately dilated LV with akinetic septal and anterior walls, moderate functional MR, normal RV size and systolic function.  - Echo (8/19): Moderate MR 5. Hyperlipidemia: Myalgias with atorvastatin.  6. PVCs: Holter 12/18 with 41% PVCs.  - PVC ablation in 2/19 with recurrence of PVCs.  7. GI bleeding:  - EGD (4/21): unremarkable.  - Colonoscopy (7/21): Diverticulosis.   SH: Married, lives in [place], works for the [employer], no smoking or ETOH.   Family History  Problem Relation Age of Onset  . Heart attack Father   . Arrhythmia Mother   . Lung cancer Mother        bronchiectasis  . Colon cancer Neg Hx   . Colon polyps Neg Hx    ROS: All systems reviewed and negative except as per HPI.   Current Outpatient Medications on File Prior to Encounter  Medication Sig Dispense Refill  . ARMOUR THYROID 60 MG tablet Take 60 mg by mouth every morning.    Marland Kitchen buPROPion (WELLBUTRIN XL) 300 MG 24 hr tablet Take 300 mg by mouth daily.    . Calcium Carb-Cholecalciferol (CALCIUM + D3 PO) Take 1 tablet by mouth every morning.    . Coenzyme Q10 (COQ10) 200 MG CAPS Take 200 mg by mouth daily.    . fexofenadine (ALLEGRA) 180 MG tablet Take 180 mg by mouth daily.    . furosemide (LASIX) 20 MG tablet Take 1 tablet (20 mg total) by mouth daily as needed. (Patient taking differently: Take 20 mg  by mouth daily as needed for fluid. ) 30 tablet 4  . losartan (COZAAR) 25 MG tablet Take 0.5 tablets (12.5 mg total) by mouth at bedtime. 15 tablet 3  . nitroGLYCERIN (NITROSTAT) 0.4 MG SL tablet Place 1 tablet (0.4 mg total) under the tongue every 5 (five) minutes as needed for chest pain. 25 tablet 3  . pantoprazole (PROTONIX) 40 MG tablet Take 1 tablet (40 mg total) by mouth 2 (two) times daily before a meal. 180 tablet 1  . potassium chloride SA (KLOR-CON) 20 MEQ tablet Take 40 mEq by mouth every other day.     . rivaroxaban (XARELTO) 20 MG TABS tablet TAKE 1 TABLET EVERY DAY WITH SUPPER (Patient taking differently: Take 20 mg by mouth daily with supper. ) 90 tablet 2  .  rosuvastatin (CRESTOR) 10 MG tablet Take 1 tablet (10 mg total) by mouth daily. 90 tablet 3  . sotalol (BETAPACE) 160 MG tablet Take 1 tablet (160 mg total) by mouth in the morning AND 0.5 tablets (80 mg total) every evening. 45 tablet 6  . traZODone (DESYREL) 50 MG tablet Take 0.5 tablets (25 mg total) by mouth at bedtime as needed for sleep. 30 tablet 0  . triamcinolone (NASACORT ALLERGY 24HR) 55 MCG/ACT AERO nasal inhaler Place 2 sprays into the nose daily.      Current Facility-Administered Medications on File Prior to Encounter  Medication Dose Route Frequency Provider Last Rate Last Admin  . bivalirudin (ANGIOMAX) 250 mg in sodium chloride 0.9 % 50 mL (5 mg/mL) infusion    Continuous PRN Kathleene Hazel, MD   Stopped at 09/20/15 [phone]  . bivalirudin (ANGIOMAX) BOLUS via infusion    PRN Kathleene Hazel, MD   54.45 mg at 09/20/15 8242  . fentaNYL (SUBLIMAZE) injection    PRN Kathleene Hazel, MD   25 mcg at 09/20/15 0651  . heparin 1,500 mL    PRN Kathleene Hazel, MD   Given at 09/20/15 [phone]  . heparin infusion 2 units/mL in 0.9 % sodium chloride    Continuous PRN Kathleene Hazel, MD   1,500 mL at 09/20/15 0733  . iopamidol (ISOVUE-370) 76 % injection    PRN Kathleene Hazel, MD    165 mL at 09/20/15 0733  . lidocaine (PF) (XYLOCAINE) 1 % injection    PRN Kathleene Hazel, MD   2 mL at 09/20/15 1443  . midazolam (VERSED) injection    PRN Kathleene Hazel, MD   1 mg at 09/20/15 0651  . nitroGLYCERIN 1 mg/10 ml (100 mcg/ml) - IR/CATH LAB    PRN Kathleene Hazel, MD   200 mcg at 09/20/15 1540  . Radial Cocktail/Verapamil only    PRN Kathleene Hazel, MD   10 mL at 09/20/15 0629  . ticagrelor (BRILINTA) tablet    PRN Kathleene Hazel, MD   180 mg at 09/20/15 [phone]  . tirofiban (AGGRASTAT) bolus via infusion    PRN Kathleene Hazel, MD   1,815 mcg at 09/20/15 [phone]  . tirofiban (AGGRASTAT) infusion 50 mcg/mL 100 mL    Continuous PRN Kathleene Hazel, MD 13.1 mL/hr at 09/20/15 0651 0.15 mcg/kg/min at 09/20/15 0651    BP 100/68   Pulse 65   Wt 66.9 kg (147 lb 6.4 oz)   SpO2 100%   BMI 21.77 kg/m  General: NAD Neck: No JVD, no thyromegaly or thyroid nodule.  Lungs: Clear to auscultation bilaterally with normal respiratory effort. CV: Nondisplaced PMI.  Heart regular S1/S2, no S3/S4, no murmur.  No peripheral edema.  No carotid bruit.  Normal pedal pulses.  Abdomen: Soft, nontender, no hepatosplenomegaly, no distention.  Skin: Intact without lesions or rashes.  Neurologic: Alert and oriented x 3.  Psych: Normal affect. Extremities: No clubbing or cyanosis.  HEENT: Normal.   Assessment/Plan: 1. CAD: S/p anterior MI in 7/17 with DES to RCA.  Cath in 10/19 with no obstructive CAD.  No further chest pain.  - She is off Plavix now and taking only Xarelto 20 mg daily.  - She is tolerating Crestor without myalgias.  Good lipids in 9/20, recheck next appt.       2. Chronic systolic CHF: Ischemic cardiomyopathy.  EF 25% on echo in 10/17, 30-35% on echo in 11/17, 30% on  TEE in 8/18. She has extensive LAD-territory scar. 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  Echo in 12/20 showed that EF remains 30% with LAD-territory scar.  She is not volume  overloaded on exam or by Corvue. NYHA class II symptoms.   - Off Coreg with low BP and addition of sotalol.   - Continue losartan 12.5 mg qhs.   - She did not tolerate dapagliflozin due to yeast infections.  - Continue Lasix 20 mg daily.    - Increase spironolactone to 25 mg qhs, BMET today and in 10 days.  - QRS does not appear to be wide enough that she would have benefit from CRT.  3. Mitral regurgitation: Moderate to severe on 10/17 echo, severe on 11/17 echo.  I did a TEE in 8/18.  This showed moderate MR that appeared to be functional.  No indication for surgery or Mitraclip. Echo 12/20 showed moderate MR, functional.  4. Atrial fibrillation: Unable to tolerate amiodarone due to GI side effects.  She is in NSR today on Xarelto 20 mg daily and sotalol. QTc interval is acceptable on sotalol on today's ECG.    5. PVCs: Very frequent on 12/18 holter (41% beats), she has had a PVC ablation in 2/19. Due to recurrence of PVCs post-ablation, sotalol was increased to 160 mg bid. She does not seem to have many PVCs.  - Continue sotalol, QTc ok on ECG today.  6. Hyperlipidemia: Tolerating Crestor, good lipids in 9/20, check next appt.  7. Fe deficiency anemia: Associated with dark stool/melena.  Hgb down to 8 at one point, has had IV Fe and transfusion.  Hgb up to 11.9 most recently, has felt better.  C-scope and EGD not revealing of definite cause. - ?Capsule endoscopy, to followup with GI.   - CBC today.   Followup 3 months.   Marca Ancona 09/22/2019

## 2019-09-22 NOTE — Patient Instructions (Signed)
Labs done today. We will contact you only if your labs are abnormal.  INCREASE Spironolactone 25mg (1 tablet) by mouth daily.  No other medication changes were made. Please continue all current medications as prescribed.   Your physician recommends that you schedule a follow-up appointment in: 10 days for a lab only appointment and in 3 months.  If you have any questions or concerns before your next appointment please send a message through [place] or call our office at [phone].    TO LEAVE A MESSAGE FOR THE NURSE SELECT OPTION 2, PLEASE LEAVE A MESSAGE INCLUDING: . YOUR NAME . DATE OF BIRTH . CALL BACK NUMBER . REASON FOR CALL**this is important as we prioritize the call backs  YOU WILL RECEIVE A CALL BACK THE SAME DAY AS LONG AS YOU CALL BEFORE 4:00 PM   At the [facility], you and your health needs are our priority. As part of our continuing mission to provide you with exceptional heart care, we have created designated Provider Care Teams. These Care Teams include your primary Cardiologist (physician) and Advanced Practice Providers (APPs- Physician Assistants and Nurse Practitioners) who all work together to provide you with the care you need, when you need it.   You may see any of the following providers on your designated Care Team at your next follow up: [phone] Dr Marland Kitchen . Dr Arvilla Meres . Marca Ancona, NP . Tonye Becket, PA . Robbie Lis, PharmD   Please be sure to bring in all your medications bottles to every appointment.

## 2019-09-23 NOTE — Progress Notes (Signed)
Remote ICD transmission.   

## 2019-09-26 ENCOUNTER — Encounter (HOSPITAL_COMMUNITY): Payer: BC Managed Care – PPO

## 2019-09-28 ENCOUNTER — Other Ambulatory Visit: Payer: Self-pay

## 2019-09-28 ENCOUNTER — Ambulatory Visit: Payer: BC Managed Care – PPO | Admitting: Gastroenterology

## 2019-09-28 ENCOUNTER — Encounter: Payer: Self-pay | Admitting: Gastroenterology

## 2019-09-28 VITALS — BP 102/70 | HR 60 | Temp 97.6°F | Ht 69.0 in | Wt 146.4 lb

## 2019-09-28 DIAGNOSIS — K219 Gastro-esophageal reflux disease without esophagitis: Secondary | ICD-10-CM

## 2019-09-28 DIAGNOSIS — D509 Iron deficiency anemia, unspecified: Secondary | ICD-10-CM

## 2019-09-28 NOTE — Patient Instructions (Addendum)
Please have labs completed.   I will discuss the possibility of a Given's Capsule with Dr. Jena Gauss to evaluate your iron deficiency anemia further.   Continue taking oral iron daily.   Continue Protonxi 40 mg twice daily 30 minutes before breakfast and dinner.   Avoid reflux triggers including spicy and tomatoes based products, fried/fatty foods, coffee, caffeine, and carbonated beverages.   You should hear from your office in the next week. Please call us back if you do not hear from [place].   Ermalinda Memos, PA-C [facility]

## 2019-09-28 NOTE — Progress Notes (Signed)
Referring Provider: Benita Stabile, MD Primary Care Physician:  Benita Stabile, MD Primary GI Physician: Dr. Jena Gauss  Chief Complaint  Patient presents with  . Anemia    c/o dark stools, taking iron once a day  . Abdominal Pain    comes/goes, mid-upper abd    HPI:   Catherine Mays is a 54 y.o. female presenting today for follow-up s/p colonoscopy and EGD.  Last seen in our office 05/23/2019 for iron deficiency anemia, nausea without vomiting, and GERD.  She reported several month history of postprandial nausea, burping, throat clearing, and cough.  Only associated with meals.  Nausea prior to meals.  BMs regular.  Stools dark but not black.  No BRBPR.  On Protonix since 2017.  Denies heartburn, dysphagia, abdominal pain, vomiting, or weight loss.  Chronically anticoagulated on Xarelto since 2017.  Noted prior hospitalization in October 2019 for chest pain underwent cardiac cath.  During admission, data noted hemoglobin 11.8.  Hemoglobin February 2020 11.6, September 2020 hemoglobin 10.  Hemoglobin down to 8.2 in March 2021 with MCV 77, iron 20, iron saturation 5%, TIBC 427, B12 normal.  PCP had arranged to iron infusions.  She was also on oral iron.  Suspected LPR as cause of upper GI symptoms.  Suspected chronic occult GI bleed in the setting of anticoagulation.  Plans to proceed with EGD and increase Protonix to 40 mg twice daily.  EGD 06/16/2019: Normal esophagus, small hiatal hernia, normal examined stomach, normal examined duodenum.  Gastric biopsy with slight chronic inflammation, duodenal biopsy with slight intramucosal Brunner gland hyperplasia. IFOBT positive on 07/04/19.  TCS 08/10/2019: Diverticulosis in sigmoid colon, nonbleeding internal hemorrhoids, otherwise normal exam.  Advise return the office in 6 weeks.  Most recent hemoglobin 11.2 on 09/22/2019 Notably, she does have CKD which seems to be improving more recently with creatinine 1.12 on 7/29.  Today:  Dark stool started before  she started taking iron. Stools continue to be dark. No bright red blood.  If she takes iron on an empty stomach her stomach will hurt. Feels intermittent burning in the epigastric area depending on what she is eating. Carbs, pizza, and coffee are known triggers.    Pantoprazole has significantly improved the cough and burping. Nausea has also resolved. No typical GERD symptoms.  No dysphagia.  Weight is stable.   BMs daily. No constipation or diarrhea.   No NSAIDs  Patient had lab report scanned in on 09/29/19 from other office.  Labs were completed 7/29.  Iron panel with iron 68, iron saturation 22%, ferritin 69.   Past Medical History:  Diagnosis Date  . AICD (automatic cardioverter/defibrillator) present    [employer]. Jude  . CAD in native artery    a. late recognition of presentation of STEMI 08/2015 s/p DES to LAD, mild residual mRCA.  Marland Kitchen Chronic systolic CHF (congestive heart failure) (HCC)   . CKD (chronic kidney disease), stage III   . Hypotension    a. preventing med titration for HF.  Marland Kitchen Hypothyroidism 09/24/2015  . Ischemic cardiomyopathy   . Myocardial infarction (HCC) 08/2015  . PAF (paroxysmal atrial fibrillation) (HCC) 09/24/2015   a. dx at time of STEMI 08/2015.  . Pre-diabetes   . Presence of permanent cardiac pacemaker    patient has ICD  . PVC's (premature ventricular contractions)     Past Surgical History:  Procedure Laterality Date  . BIOPSY  06/16/2019   Procedure: BIOPSY;  Surgeon: Corbin Ade, MD;  Location: [facility]  ENDO SUITE;  Service: Endoscopy;;  . CARDIAC CATHETERIZATION N/A 09/20/2015   Procedure: Left Heart Cath and Coronary Angiography;  Surgeon: Kathleene Hazel, MD;  Location: [facility] INVASIVE CV LAB;  Service: Cardiovascular;  Laterality: N/A;  . CARDIAC CATHETERIZATION N/A 09/20/2015   Procedure: Coronary Stent Intervention;  Surgeon: Kathleene Hazel, MD;  Location: [facility] INVASIVE CV LAB;  Service: Cardiovascular;  Laterality: N/A;  . CARDIAC  CATHETERIZATION N/A 09/21/2015   Procedure: Left Heart Cath and Coronary Angiography;  Surgeon: Tonny Bollman, MD;  Location: [facility] INVASIVE CV LAB;  Service: Cardiovascular;  Laterality: N/A;  . CARDIAC CATHETERIZATION N/A 11/26/2015   Procedure: Right Heart Cath;  Surgeon: [name], MD;  Location: [facility] INVASIVE CV LAB;  Service: Cardiovascular;  Laterality: N/A;  . COLONOSCOPY WITH PROPOFOL N/A 11/23/2017   Dr. Jena Gauss: Diverticulosis, internal hemorrhoids, next colonoscopy in 10 years  . COLONOSCOPY WITH PROPOFOL N/A 08/10/2019   Procedure: COLONOSCOPY WITH PROPOFOL;  Surgeon: Corbin Ade, MD;  Diverticulosis in sigmoid colon, nonbleeding internal hemorrhoids, otherwise normal exam.    . CORONARY STENT PLACEMENT  09/20/2015  . EP IMPLANTABLE DEVICE N/A 01/23/2016   Procedure: ICD Implant;  Surgeon: Will Jorja Loa, MD;  Location: [facility] INVASIVE CV LAB;  Service: Cardiovascular;  Laterality: N/A;  . ESOPHAGOGASTRODUODENOSCOPY (EGD) WITH PROPOFOL N/A 06/16/2019   Procedure: ESOPHAGOGASTRODUODENOSCOPY (EGD) WITH PROPOFOL;  Surgeon: Corbin Ade, MD;  Normal esophagus, small hiatal hernia, normal examined stomach, normal examined duodenum.  Gastric biopsy with slight chronic inflammation, duodenal biopsy with slight intramucosal Brunner gland hyperplasia.  Marland Kitchen LEFT HEART CATH AND CORONARY ANGIOGRAPHY N/A 12/18/2017   Procedure: LEFT HEART CATH AND CORONARY ANGIOGRAPHY;  Surgeon: [name], MD;  Location: [facility] INVASIVE CV LAB;  Service: Cardiovascular;  Laterality: N/A;  . PVC ABLATION N/A 04/02/2017   Procedure: PVC ABLATION;  Surgeon: Regan Lemming, MD;  Location: [facility] INVASIVE CV LAB;  Service: Cardiovascular;  Laterality: N/A;  . TEE WITHOUT CARDIOVERSION N/A 10/08/2016   Procedure: TRANSESOPHAGEAL ECHOCARDIOGRAM (TEE);  Surgeon: [name], MD;  Location: [facility] ENDOSCOPY;  Service: Cardiovascular;  Laterality: N/A;  . THYROIDECTOMY      Current Outpatient Medications  Medication  Sig Dispense Refill  . ARMOUR THYROID 60 MG tablet Take 60 mg by mouth every morning.    Marland Kitchen buPROPion (WELLBUTRIN XL) 300 MG 24 hr tablet Take 300 mg by mouth daily.    . Calcium Carb-Cholecalciferol (CALCIUM + D3 PO) Take 1 tablet by mouth every morning.    . Coenzyme Q10 (COQ10) 200 MG CAPS Take 200 mg by mouth daily.    . ferrous sulfate 325 (65 FE) MG EC tablet Take 325 mg by mouth daily.    . fexofenadine (ALLEGRA) 180 MG tablet Take 180 mg by mouth daily.    . furosemide (LASIX) 20 MG tablet Take 20 mg by mouth daily.    Marland Kitchen losartan (COZAAR) 25 MG tablet Take 0.5 tablets (12.5 mg total) by mouth at bedtime. 15 tablet 3  . nitroGLYCERIN (NITROSTAT) 0.4 MG SL tablet Place 1 tablet (0.4 mg total) under the tongue every 5 (five) minutes as needed for chest pain. 25 tablet 3  . pantoprazole (PROTONIX) 40 MG tablet Take 1 tablet (40 mg total) by mouth 2 (two) times daily before a meal. 180 tablet 1  . potassium chloride SA (KLOR-CON) 20 MEQ tablet Take 40 mEq by mouth every other day.     . rivaroxaban (XARELTO) 20 MG TABS tablet Take 20 mg by  mouth daily with supper.    . rosuvastatin (CRESTOR) 10 MG tablet Take 1 tablet (10 mg total) by mouth daily. 90 tablet 3  . sotalol (BETAPACE) 160 MG tablet Take 1 tablet (160 mg total) by mouth in the morning AND 0.5 tablets (80 mg total) every evening. 45 tablet 6  . spironolactone (ALDACTONE) 25 MG tablet Take 1 tablet (25 mg total) by mouth daily. 30 tablet 11  . traZODone (DESYREL) 50 MG tablet Take 0.5 tablets (25 mg total) by mouth at bedtime as needed for sleep. 30 tablet 0  . triamcinolone (NASACORT ALLERGY 24HR) 55 MCG/ACT AERO nasal inhaler Place 2 sprays into the nose daily.      No current facility-administered medications for this visit.   Facility-Administered Medications Ordered in Other Visits  Medication Dose Route Frequency Provider Last Rate Last Admin  . bivalirudin (ANGIOMAX) 250 mg in sodium chloride 0.9 % 50 mL (5 mg/mL) infusion     Continuous PRN Kathleene Hazel, MD   Stopped at 09/20/15 [phone]  . bivalirudin (ANGIOMAX) BOLUS via infusion    PRN Kathleene Hazel, MD   54.45 mg at 09/20/15 6606  . fentaNYL (SUBLIMAZE) injection    PRN Kathleene Hazel, MD   25 mcg at 09/20/15 0651  . heparin 1,500 mL    PRN Kathleene Hazel, MD   Given at 09/20/15 [phone]  . heparin infusion 2 units/mL in 0.9 % sodium chloride    Continuous PRN Kathleene Hazel, MD   1,500 mL at 09/20/15 0733  . iopamidol (ISOVUE-370) 76 % injection    PRN Kathleene Hazel, MD   165 mL at 09/20/15 0733  . lidocaine (PF) (XYLOCAINE) 1 % injection    PRN Kathleene Hazel, MD   2 mL at 09/20/15 0109  . midazolam (VERSED) injection    PRN Kathleene Hazel, MD   1 mg at 09/20/15 0651  . nitroGLYCERIN 1 mg/10 ml (100 mcg/ml) - IR/CATH LAB    PRN Kathleene Hazel, MD   200 mcg at 09/20/15 3235  . Radial Cocktail/Verapamil only    PRN Kathleene Hazel, MD   10 mL at 09/20/15 0629  . ticagrelor (BRILINTA) tablet    PRN Kathleene Hazel, MD   180 mg at 09/20/15 [phone]  . tirofiban (AGGRASTAT) bolus via infusion    PRN Kathleene Hazel, MD   1,815 mcg at 09/20/15 [phone]  . tirofiban (AGGRASTAT) infusion 50 mcg/mL 100 mL    Continuous PRN Kathleene Hazel, MD 13.1 mL/hr at 09/20/15 0651 0.15 mcg/kg/min at 09/20/15 0651    Allergies as of 09/28/2019 - Review Complete 09/28/2019  Allergen Reaction Noted  . Paxil [paroxetine] Palpitations 11/23/2015  . Morphine and related Nausea And Vomiting 11/23/2015  . Percocet [oxycodone-acetaminophen] Nausea And Vomiting 11/23/2015  . Cefdinir Nausea Only 11/23/2015  . Zolpidem Other (See Comments) 11/23/2015    Family History  Problem Relation Age of Onset  . Heart attack Father   . Arrhythmia Mother   . Lung cancer Mother        bronchiectasis  . Colon cancer Neg Hx   . Colon polyps Neg Hx     Social History   Socioeconomic History    . Marital status: Married    Spouse name: Not on file  . Number of children: Not on file  . Years of education: Not on file  . Highest education level: Not on file  Occupational History  . Not  on file  Tobacco Use  . Smoking status: Never Smoker  . Smokeless tobacco: Never Used  Vaping Use  . Vaping Use: Never used  Substance and Sexual Activity  . Alcohol use: No  . Drug use: No  . Sexual activity: Not on file  Other Topics Concern  . Not on file  Social History Narrative  . Not on file   Social Determinants of Health   Financial Resource Strain:   . Difficulty of Paying Living Expenses:   Food Insecurity:   . Worried About Programme researcher, broadcasting/film/video in the Last Year:   . Barista in the Last Year:   Transportation Needs:   . Freight forwarder (Medical):   Marland Kitchen Lack of Transportation (Non-Medical):   Physical Activity:   . Days of Exercise per Week:   . Minutes of Exercise per Session:   Stress:   . Feeling of Stress :   Social Connections:   . Frequency of Communication with Friends and Family:   . Frequency of Social Gatherings with Friends and Family:   . Attends Religious Services:   . Active Member of Clubs or Organizations:   . Attends Banker Meetings:   Marland Kitchen Marital Status:     Review of Systems: Gen: Denies fever, chills, cold or flulike symptoms, lightheadedness, dizziness, presyncope, syncope. CV: Denies chest pain or palpitations.  Resp: Denies dyspnea or cough GI: See HPI Derm: Denies rash  Heme: See HPI  Physical Exam: BP 102/70   Pulse 60   Temp 97.6 F (36.4 C)   Ht 5\' 9"  (1.753 m)   Wt 146 lb 6.4 oz (66.4 kg)   BMI 21.62 kg/m  General:   Alert and oriented. No distress noted. Pleasant and cooperative.  Head:  Normocephalic and atraumatic. Eyes:  Conjuctiva clear without scleral icterus. Heart:  S1, S2 present without murmurs appreciated. Lungs:  Clear to auscultation bilaterally. No wheezes, rales, or rhonchi. No  distress.  Abdomen:  +BS, soft, non-tender and non-distended. No rebound or guarding. No HSM or masses noted. Msk:  Symmetrical without gross deformities. Normal posture. Extremities:  Without edema. Neurologic:  Alert and  oriented x4 Psych: Normal mood and affect.

## 2019-09-29 ENCOUNTER — Encounter: Payer: Self-pay | Admitting: Gastroenterology

## 2019-09-29 ENCOUNTER — Other Ambulatory Visit (HOSPITAL_COMMUNITY): Payer: Self-pay | Admitting: Cardiology

## 2019-09-29 ENCOUNTER — Telehealth: Payer: Self-pay | Admitting: Gastroenterology

## 2019-09-29 NOTE — Assessment & Plan Note (Addendum)
54 year old female with history significant for MI, now with CHF with ICD, atrial fibrillation chronically anticoagulated on Xarelto, and chronic kidney disease who was noted to have mild anemia in October 2019 with hemoglobin 11.8.  Hemoglobin trended down as low as 8.2 in March 2021 with MCV 77, iron 20, iron saturation 5%, TIBC 427, B12 normal.  She received iron infusions with PCP and was also started on oral iron.  EGD 06/16/2019 with normal esophagus, small hiatal hernia, normal examined stomach and duodenum.  Gastric biopsy with slight chronic inflammation, duodenal biopsy with slight intramucosal Brunner gland hyperplasia.  I fob + 07/04/2019.  Colonoscopy 08/10/2019 with diverticulosis in the sigmoid colon, nonbleeding internal hemorrhoids, otherwise normal exam.  Most recent labs on 09/22/2019 with hemoglobin 11.2, iron 68, iron saturation 22%, ferritin 69.  She continues on daily oral iron.  Admits to ongoing dark stools which started before oral iron.  No bright red blood per rectum.  Weight is stable.   At this point, we do not have any clear explanation of iron deficiency anemia.  Considering history of CKD, this very well could be contributing.  However, I feel would likely need to move forward with evaluating her small bowel.  I will discuss this further with Dr. Jena Gauss as patient has ICD.  Further recommendations to follow.   Of note, add initially ordered an iron panel for patient.  She had labs scanned into the system 09/29/2019 from another office which contained an iron panel.  Will contact patient to let her know she does not need to repeat labs.

## 2019-09-29 NOTE — Telephone Encounter (Signed)
Catherine Mays, please let patient know that she does not need her iron checked.  She had a lab report scanned into the system 09/29/2019 from another providers office with labs dated 7/29 that included an iron panel.  Her iron has improved and is currently within normal limits but still on the lower end of normal.  She should still continue her oral iron daily.

## 2019-09-29 NOTE — Assessment & Plan Note (Addendum)
Chronic history of GERD.  Patient has been on Protonix 40 mg daily since 2017.  She presented with new onset of postprandial coughing, throat clearing, hiccups, and nausea in March 2021.  Protonix was increased to twice daily and she is had significant improvement/resolution of her upper GI symptoms.  Occasional epigastric burning depending on what she eats.  Triggers include pizza, carbs, and coffee.  EGD 06/14/2019 with normal esophagus, small hiatal hernia, normal examined stomach and duodenum.  Stomach was biopsied and revealed slight chronic inflammation.  Duodenum was biopsied revealing slight Brunner gland hyperplasia.  Plan: Advised to continue Protonix 40 mg twice daily 30 minutes before breakfast and dinner. Avoid reflux triggers including spicy and tomatoes based products, fried/fatty foods, coffee, caffeine, and carbonated beverages.

## 2019-09-30 NOTE — Telephone Encounter (Signed)
Spoke with pt. Pt was notified that labs have been reviewed and she doesn't need labs at this time. Pt will continue her oral iron daily.

## 2019-10-06 ENCOUNTER — Other Ambulatory Visit (HOSPITAL_COMMUNITY): Payer: Self-pay | Admitting: Cardiology

## 2019-10-25 ENCOUNTER — Other Ambulatory Visit (HOSPITAL_COMMUNITY): Payer: Self-pay | Admitting: Cardiology

## 2019-11-06 ENCOUNTER — Telehealth: Payer: Self-pay | Admitting: Gastroenterology

## 2019-11-06 NOTE — Telephone Encounter (Signed)
[facility] Clinical Pool:   Please let patient know I have discussed pursuing Givens capsule with Dr. Jena Gauss and her cardiologists, Dr. Elberta Fortis and Dr. Clifton James. Dr. Elberta Fortis spoke with the equipment representative who stated that a problem occurring with her ICD is very unlikely during the Givens study. Dr. Elberta Fortis states she should be fine to have Givens completed as an outpatient. Dr. Jena Gauss is agreeable to proceed.   If she is agreeable, please arrange Givens capsule. Dx: IDA.  She will need to hold oral iron x 7 days prior to procedure.   Documentation below is our messages regarding this matter for the medical record.   11/03/19 Rourk, Gerrit Friends, MD  Letta Median, PA-C OK; cardiologist and equip rep have ok'd it; let's proceed and keep recent communications in the permanent medical record. Thanks.       Previous Messages   ----- Message -----  From: Hal Neer  Sent: 10/31/2019  8:46 PM EDT  To: Corbin Ade, MD, Letta Median, PA-C  Subject: FW: ICD and Pill Cam Endoscopy          Dr. Jena Gauss,   See messages below from patient's cardiologist regarding Givens and ICD. Please let me know your thoughts on moving forward.   Thanks,  Baxter Hire   ----- Message -----  From: Regan Lemming, MD  Sent: 10/10/2019 12:28 PM EDT  To: Letta Median, PA-C  Subject: RE: ICD and Pill Cam Endoscopy          I do not think that further monitoring is necessary. The [employer] rep stated that this is very unlikely to be an issue. Would be okay with her going home and having this done as an outpatient.   ----- Message -----  From: Hal Neer  Sent: 10/10/2019 11:59 AM EDT  To: Will Jorja Loa, MD  Subject: RE: ICD and Pill Cam Endoscopy          Dr. Elberta Fortis,   Would you recommend for patient to be monitored at all during capsule endoscopy? Usually we will place the capsule and let the patient go home immediately thereafter. Dr.  Jena Gauss mentioned the possibility of having the patient monitored in the hospital and having the ICD deactivated for the 8 hours of the study. Not sure if there is any real concern of possible activation of patient's ICD secondary to the Pill Cam.   Thanks,  Ermalinda Memos, PA-C  [facility]    ----- Message -----  From: Regan Lemming, MD  Sent: 10/10/2019 10:44 AM EDT  To: Kathleene Hazel, MD, *  Subject: RE: ICD and Pill Cam Endoscopy          Just checked with the medtronic rep and it should be fine for capsule endoscopy. Thanks.   ----- Message -----  From: Kathleene Hazel, MD  Sent: 10/09/2019  3:23 PM EDT  To: Regan Lemming, MD, *  Subject: RE: ICD and Pill Cam Endoscopy          Any thoughts on this Will?   ----- Message -----  From: Hal Neer  Sent: 10/05/2019 12:31 PM EDT  To: Will Jorja Loa, MD, *  Subject: ICD and Pill Cam Endoscopy            Good afternoon,   My name is Ermalinda Memos. I am one of the PAs at [facility] seeing a mutual patient of ours, Donnis Pecha, for iron deficiency anemia.  She has had colonoscopy and EGD without significant findings. I was considering pursuing a Givens Capsule (Pillcam endoscopy) to evaluate her small bowel.   Dr. Jena Gauss has requested I reach out to you all to get your opinion on this matter due to patient's ICD.   Per recommendations from PillCam, pacemakers or ICDs are contraindications. Per up-to-date, it states this is a recommendation on the package insert but doesn't appear to be a significant clinical problem. Please let me know your thoughts.   Thanks,  Ermalinda Memos, PA-C  [facility]

## 2019-11-07 NOTE — Telephone Encounter (Addendum)
Called endo and received dates procedure can be done. Spoke with pt and we have scheduled givens for 9/29 at 7:30am. Pt aware will send instructions to [facility].   Checked BCBS and no PA is required for givens capsule study

## 2019-11-07 NOTE — Telephone Encounter (Signed)
Called pt. She was agreeable to having GIVENS being done.

## 2019-11-13 ENCOUNTER — Other Ambulatory Visit: Payer: Self-pay | Admitting: Gastroenterology

## 2019-11-22 ENCOUNTER — Telehealth: Payer: Self-pay | Admitting: Internal Medicine

## 2019-11-22 NOTE — Telephone Encounter (Signed)
Pt is scheduled a Givens tomorrow and wants to know if she needed to hold her Xarelto or not. Please advise. [phone]

## 2019-11-22 NOTE — Telephone Encounter (Signed)
Confirmed with Wynne Dust, NP we do not hold zarelto for givens.  Called pt and made aware. She voiced understanding

## 2019-11-23 ENCOUNTER — Ambulatory Visit (HOSPITAL_COMMUNITY)
Admission: RE | Admit: 2019-11-23 | Discharge: 2019-11-23 | Disposition: A | Discharge status: Home / Self Care | Payer: BC Managed Care – PPO | Attending: Internal Medicine | Admitting: Internal Medicine

## 2019-11-23 ENCOUNTER — Encounter (HOSPITAL_COMMUNITY)
Admission: RE | Disposition: A | Discharge status: Home / Self Care | Payer: Self-pay | Source: Home / Self Care | Attending: Internal Medicine

## 2019-11-23 DIAGNOSIS — D509 Iron deficiency anemia, unspecified: Secondary | ICD-10-CM | POA: Diagnosis not present

## 2019-11-23 HISTORY — PX: GIVENS CAPSULE STUDY: SHX5432

## 2019-11-23 SURGERY — IMAGING PROCEDURE, GI TRACT, INTRALUMINAL, VIA CAPSULE

## 2019-11-24 ENCOUNTER — Encounter (HOSPITAL_COMMUNITY): Payer: Self-pay | Admitting: Internal Medicine

## 2019-11-24 ENCOUNTER — Telehealth: Payer: Self-pay | Admitting: Gastroenterology

## 2019-11-24 DIAGNOSIS — D500 Iron deficiency anemia secondary to blood loss (chronic): Secondary | ICD-10-CM

## 2019-11-24 NOTE — Telephone Encounter (Signed)
Please let pt know her capsule study showed the following. Please have her complete CBC, ferritin, TTG IgA, serum IgA in 4-6 weeks. Avoid NSAIDs.  Small bowel capsule essentially unremarkable except for tiny erosion noted in the proximal small bowel as outlined above.    1. For completeness, consider celiac serologies for IDA.  2. Continue to monitor H/H, ferritin.

## 2019-11-24 NOTE — Telephone Encounter (Signed)
Lmom, waiting on a return call.  

## 2019-11-24 NOTE — Telephone Encounter (Signed)
Pt returned call. Pt was notified of results. Lab orders will be placed and pt will avoid NSAIDS as directed.

## 2019-11-24 NOTE — Op Note (Signed)
  Small Bowel Givens Capsule Study Procedure date: 11/23/2019  Referring Provider: Ermalinda Memos, PA-C PCP:  Dr. Margo Aye, Kathleene Hazel, MD  Indication for procedure: 54 year old female with iron deficiency anemia, Hemoccult positive stool in the setting of Xarelto.  EGD in April 2021 showed normal esophagus, small hiatal hernia, normal examined stomach and duodenum.  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  Colonoscopy June 2021 showed diverticulosis, nonbleeding internal hemorrhoids but otherwise normal exam.  Patient denies NSAIDs.  Patient data:  Wt: 146 pounds Ht: 5 foot 9 inches Waist: Unknown  Findings: Patient swallowed capsule without difficulty.  Study overall unremarkable.  Tiny erosion noted at 50 minutes and 44 seconds.  Prominent vasculature noted at 2 hours 43 minutes and 41 seconds.  No active bleeding noted on this study.  No masses, ulcers, or findings to explain iron deficiency anemia.  First Gastric image: 2 minutes and 6 seconds First Duodenal image: 30 minutes and 37 seconds First Ileo-Cecal Valve image: 4 hours 47 minutes and 9 seconds First Cecal image: 4 hours 48 minutes and 40 seconds Gastric Passage time: 28 minutes  Small Bowel Passage time: 4 hours 18 minutes   Summary & Recommendations: 53 year old female with iron deficiency anemia, heme positive stool in the setting of Xarelto.  Small bowel capsule essentially unremarkable except for tiny erosion noted in the proximal small bowel as outlined above.    1. For completeness, consider celiac serologies for IDA.  2. Continue to monitor H/H, ferritin.   Leanna Battles. Dixon Boos [facility] [phone] 9/30/20213:13 PM

## 2019-11-25 ENCOUNTER — Other Ambulatory Visit: Payer: Self-pay

## 2019-11-25 DIAGNOSIS — Z79899 Other long term (current) drug therapy: Secondary | ICD-10-CM

## 2019-11-25 DIAGNOSIS — D509 Iron deficiency anemia, unspecified: Secondary | ICD-10-CM

## 2019-11-28 ENCOUNTER — Encounter: Payer: Self-pay | Admitting: *Deleted

## 2019-11-28 ENCOUNTER — Other Ambulatory Visit: Payer: Self-pay | Admitting: *Deleted

## 2019-11-28 DIAGNOSIS — Z79899 Other long term (current) drug therapy: Secondary | ICD-10-CM

## 2019-11-28 DIAGNOSIS — D509 Iron deficiency anemia, unspecified: Secondary | ICD-10-CM

## 2019-12-17 ENCOUNTER — Other Ambulatory Visit: Payer: Self-pay | Admitting: Cardiovascular Disease

## 2019-12-19 NOTE — Telephone Encounter (Signed)
Prescription refill request for Xarelto received.  Indication:afib Last office visit:09/22/19 Weight:65.8 kg Age:54 Scr:1.37 10/14/19 CrCl:49.3 Previous scr were slightly lower- crcl boarderline. Will continue xarelto 20mg 

## 2019-12-20 ENCOUNTER — Ambulatory Visit (INDEPENDENT_AMBULATORY_CARE_PROVIDER_SITE_OTHER): Payer: BC Managed Care – PPO

## 2019-12-20 DIAGNOSIS — I255 Ischemic cardiomyopathy: Secondary | ICD-10-CM

## 2019-12-20 DIAGNOSIS — I5022 Chronic systolic (congestive) heart failure: Secondary | ICD-10-CM

## 2019-12-20 LAB — CUP PACEART REMOTE DEVICE CHECK
Battery Remaining Longevity: 70 mo
Battery Remaining Percentage: 67 %
Battery Voltage: 2.96 V
Brady Statistic RV Percent Paced: 1 %
Date Time Interrogation Session: 20211026020017
HighPow Impedance: 66 Ohm
HighPow Impedance: 66 Ohm
Implantable Lead Implant Date: 20171129
Implantable Lead Location: 753860
Implantable Pulse Generator Implant Date: 20171129
Lead Channel Impedance Value: 330 Ohm
Lead Channel Pacing Threshold Amplitude: 1.25 V
Lead Channel Pacing Threshold Pulse Width: 0.5 ms
Lead Channel Sensing Intrinsic Amplitude: 12 mV
Lead Channel Setting Pacing Amplitude: 2.5 V
Lead Channel Setting Pacing Pulse Width: 0.5 ms
Lead Channel Setting Sensing Sensitivity: 0.5 mV
Pulse Gen Serial Number: 7377983

## 2019-12-21 ENCOUNTER — Ambulatory Visit (HOSPITAL_COMMUNITY)
Admission: RE | Admit: 2019-12-21 | Discharge: 2019-12-21 | Disposition: A | Discharge status: Home / Self Care | Payer: BC Managed Care – PPO | Source: Ambulatory Visit | Attending: Cardiology | Admitting: Cardiology

## 2019-12-21 ENCOUNTER — Other Ambulatory Visit: Payer: Self-pay

## 2019-12-21 ENCOUNTER — Encounter (HOSPITAL_COMMUNITY): Payer: Self-pay | Admitting: Cardiology

## 2019-12-21 VITALS — BP 90/58 | HR 55 | Wt 148.6 lb

## 2019-12-21 DIAGNOSIS — Z7901 Long term (current) use of anticoagulants: Secondary | ICD-10-CM | POA: Diagnosis not present

## 2019-12-21 DIAGNOSIS — Z79899 Other long term (current) drug therapy: Secondary | ICD-10-CM | POA: Diagnosis not present

## 2019-12-21 DIAGNOSIS — I2510 Atherosclerotic heart disease of native coronary artery without angina pectoris: Secondary | ICD-10-CM | POA: Diagnosis not present

## 2019-12-21 DIAGNOSIS — Z8249 Family history of ischemic heart disease and other diseases of the circulatory system: Secondary | ICD-10-CM | POA: Diagnosis not present

## 2019-12-21 DIAGNOSIS — I5022 Chronic systolic (congestive) heart failure: Secondary | ICD-10-CM | POA: Insufficient documentation

## 2019-12-21 DIAGNOSIS — Z95810 Presence of automatic (implantable) cardiac defibrillator: Secondary | ICD-10-CM | POA: Diagnosis not present

## 2019-12-21 DIAGNOSIS — R42 Dizziness and giddiness: Secondary | ICD-10-CM | POA: Insufficient documentation

## 2019-12-21 DIAGNOSIS — I340 Nonrheumatic mitral (valve) insufficiency: Secondary | ICD-10-CM | POA: Insufficient documentation

## 2019-12-21 DIAGNOSIS — I255 Ischemic cardiomyopathy: Secondary | ICD-10-CM | POA: Insufficient documentation

## 2019-12-21 DIAGNOSIS — I252 Old myocardial infarction: Secondary | ICD-10-CM | POA: Diagnosis not present

## 2019-12-21 DIAGNOSIS — I480 Paroxysmal atrial fibrillation: Secondary | ICD-10-CM | POA: Diagnosis not present

## 2019-12-21 DIAGNOSIS — E785 Hyperlipidemia, unspecified: Secondary | ICD-10-CM | POA: Diagnosis not present

## 2019-12-21 DIAGNOSIS — D509 Iron deficiency anemia, unspecified: Secondary | ICD-10-CM | POA: Diagnosis not present

## 2019-12-21 DIAGNOSIS — Z955 Presence of coronary angioplasty implant and graft: Secondary | ICD-10-CM | POA: Insufficient documentation

## 2019-12-21 LAB — LIPID PANEL
Cholesterol: 124 mg/dL (ref 0–200)
HDL: 50 mg/dL (ref >40)
LDL Cholesterol: 53 mg/dL (ref 0–99)
Total CHOL/HDL Ratio: 2.5 RATIO
Triglycerides: 105 mg/dL (ref <150)
VLDL: 21 mg/dL (ref 0–40)

## 2019-12-21 LAB — BASIC METABOLIC PANEL
Anion gap: 9 (ref 5–15)
BUN: 24 mg/dL — ABNORMAL HIGH (ref 6–20)
CO2: 26 mmol/L (ref 22–32)
Calcium: 9 mg/dL (ref 8.9–10.3)
Chloride: 103 mmol/L (ref 98–111)
Creatinine, Ser: 1.38 mg/dL — ABNORMAL HIGH (ref 0.44–1.00)
GFR, Estimated: 46 mL/min — ABNORMAL LOW (ref >60)
Glucose, Bld: 111 mg/dL — ABNORMAL HIGH (ref 70–99)
Potassium: 4.5 mmol/L (ref 3.5–5.1)
Sodium: 138 mmol/L (ref 135–145)

## 2019-12-21 LAB — CBC
HCT: 34.4 % — ABNORMAL LOW (ref 36.0–46.0)
Hemoglobin: 10.9 g/dL — ABNORMAL LOW (ref 12.0–15.0)
MCH: 30.6 pg (ref 26.0–34.0)
MCHC: 31.7 g/dL (ref 30.0–36.0)
MCV: 96.6 fL (ref 80.0–100.0)
Platelets: 158 10*3/uL (ref 150–400)
RBC: 3.56 MIL/uL — ABNORMAL LOW (ref 3.87–5.11)
RDW: 12.3 % (ref 11.5–15.5)
WBC: 4.5 10*3/uL (ref 4.0–10.5)
nRBC: 0 % (ref 0.0–0.2)

## 2019-12-21 MED ORDER — NITROGLYCERIN 0.4 MG SL SUBL: 0.4000 mg | SUBLINGUAL_TABLET | SUBLINGUAL | 3 refills | Status: DC | PRN

## 2019-12-21 NOTE — Patient Instructions (Signed)
Labs done today, your results will be available in MyChart, we will contact you for abnormal readings. ° °Your physician recommends that you schedule a follow-up appointment in: 3 months with echocardiogram ° °If you have any questions or concerns before your next appointment please send us a message through mychart or call our office at [phone].   ° °TO LEAVE A MESSAGE FOR THE NURSE SELECT OPTION 2, PLEASE LEAVE A MESSAGE INCLUDING: °• YOUR NAME °• DATE OF BIRTH °• CALL BACK NUMBER °• REASON FOR CALL**this is important as we prioritize the call backs ° °YOU WILL RECEIVE A CALL BACK THE SAME DAY AS LONG AS YOU CALL BEFORE 4:00 PM ° °At the [facility], you and your health needs are our priority. As part of our continuing mission to provide you with exceptional heart care, we have created designated Provider Care Teams. These Care Teams include your primary Cardiologist (physician) and Advanced Practice Providers (APPs- Physician Assistants and Nurse Practitioners) who all work together to provide you with the care you need, when you need it.  ° °You may see any of the following providers on your designated Care Team at your next follow up: °• Dr Daniel Bensimhon °• Dr Dalton McLean °• Amy Clegg, NP °• Brittainy Simmons, PA °• Lauren Kemp, PharmD ° ° °Please be sure to bring in all your medications bottles to every appointment.  ° ° ° °

## 2019-12-21 NOTE — Progress Notes (Signed)
PCP: Dr. Margo Aye HF Cardiology: Dr. Shirlee Latch  54 y.o. with history of CAD s/p anterior STEMI with DES to LAD, ischemic cardiomyopathy, mitral regurgitation, and paroxysmal atrial fibrillation presents for followup of CHF and CAD.  She was admitted in 7/17 with anterior STEMI.  She had a late presentation to the cath lab.  Echo in 10/17 showed EF about 25% with LAD-territory wall motion abnormalities.  She had peri-MI atrial fibrillation and was started on Xarelto.  She was put on amiodarone.   After discharge, she continued to feel poorly.  She developed nausea, anorexia, and significant fatigue.  She was admitted for evaluation in 10/17 given concern for low output heart failure.  However, stopping amiodarone essentially resolved her symptoms.  She had RHC showing preserved cardiac output but the PCWP was high due to prominent v-waves, presumably from significant mitral regurgitation.  Of note, she was in atrial fibrillation for a time during this admission.   She had [employer] ICD placed.  She is back at work for the [employer].   TEE in 8/18 to evaluate the mitral valve showed EF 30%, moderate MR (probably functional).    Patient was seen by Dr. Elberta Fortis and noted to have very frequent PVCs on device interrogation.  Holter in 12/18 showed 41% PVCs.  She was started on sotalol 80 mg bid and titrated up to 120 mg bid.  Coreg was stopped with the addition of sotalol and losartan was decreased to 12.5 mg daily due to low BP.  She had PVC ablation in 2/19 but PVCs recurred. Sotalol was increased to 160 mg bid.   Echo 8/19 showed EF 30% with regional wall motion abnormalities, normal RV size and systolic function, moderate MR.   She was admitted in 10/19 with chest pain.  Cath showed patent LAD stent and no obstructive CAD.   Echo was done today and reviewed, EF 30% with LAD wall motion abnormalities, normal RV, moderate MR.   I started her on Farxiga but she had to stop it after developing a  chronic yeast infection.   She was found to be anemic with Fe deficiency (hgb down to 8).  She has had IV Fe and blood transfusion.  EGD and colonoscopy this year were fairly unremarkable.  She had diverticulosis.  Capsule endoscopy negative in 9/21.   She is stable today.  Occasional mild lightheadedness with standing. BP still runs low.  No significant exertional dyspnea.  No chest pain.  No orthopnea/PND.  She has been taking Lasix 20 mg daily. Weight is stable.   ECG (personally reviewed): NSR, IVCD 128 msec, QTc 418 msec  Labs (10/17): K 3.9, creatinine 1.24, hgb 10.3 Labs (11/17): K 4, creatinine 1.27 Labs (12/17): K 4.2, creatinine 1.12, BNP 439 Labs (2/18): K 4.7, creatinine 1.37 Labs (7/18): K 3.9, creatinine 1.14, hgb 11.7 Labs (12/18): K 4.5, creatinine 1.23 Labs (1/19): K 4.4, creatinine 1.14 Labs (2/19): LDL 97 (off atorvastatin), HDL 56 Labs (5/19): K 4.3, creatinine 1.1 Labs (10/19): K 4.2, creatinine 1.2, LDL 70, hgb 11.8 Labs (11/19): K 4.9, creatinine 1.17, hgb 11.8, LDL 68, low TSH  Labs (9/20): K 4.4, creatinine 1.34, LDL 52 Labs (12/20): K 4.6, creatinine 1.19 Labs (6/21): LDL 73, HDL 62, hgb 11.9, K 4.2, creatinine 1.14 Labs (7/21): hgb 11.2, K 3.8, creatinine 1.12  PMH:  1. CAD: Anterior STEMI in 7/17 with late presentation to the cath lab. She had DES to proximal LAD.  No significant disease in other vessels.  -  LHC (10/19): Patent LAD stent, no obstructive disease.  2. Chronic systolic CHF: Ischemic cardiomyopathy.  - Echo 10/17 with EF 25%, akinesis of the mid-apical anteroseptal and anterior walls, apical dyskinesis, moderate to severe MR with incomplete coaptation.  - RHC (10/17): mean RA 4, PA 59/17 mean 38, mean PCWP 27 with prominent V waves suggesting significant MR, CI 3.06, PVR 2 WU.  - ACEI cough.  - Echo (11/17): EF 30-35%, normal RV size and systolic function, severe MR with restriction of posterior leaflet.   - Echo (8/19): EF 30% with regional  wall motion abnormalities, normal RV size and systolic function, moderate MR.  - Echo (12/20): EF 30%, LAD wall motion abnormalities, normal RV, PASP 35, moderate functional MR.  3. Atrial fibrillation: Paroxysmal.  Noted 7/17 admission peri-MI, also noted again 10/17 admission. She did not tolerate amiodarone due to nausea/anorexia.  4. Mitral regurgitation: Moderate to severe on 10/17 echo, incomplete coaptation.  ?Etiology, her MI (anterior) does not typically lead to ischemic MR. - Echo in 11/17 with severe MR, posterior leaflet restricted.  - TEE (8/18): EF 30%, moderately dilated LV with akinetic septal and anterior walls, moderate functional MR, normal RV size and systolic function.  - Echo (8/19): Moderate MR 5. Hyperlipidemia: Myalgias with atorvastatin.  6. PVCs: Holter 12/18 with 41% PVCs.  - PVC ablation in 2/19 with recurrence of PVCs.  7. GI bleeding:  - EGD (4/21): unremarkable.  - Colonoscopy (7/21): Diverticulosis.  - Capsule endoscopy (9/21): negative.   SH: Married, lives in [place], works for the [employer], no smoking or ETOH.   Family History  Problem Relation Age of Onset  . Heart attack Father   . Arrhythmia Mother   . Lung cancer Mother        bronchiectasis  . Colon cancer Neg Hx   . Colon polyps Neg Hx    ROS: All systems reviewed and negative except as per HPI.   Current Outpatient Medications on File Prior to Encounter  Medication Sig Dispense Refill  . buPROPion (WELLBUTRIN XL) 300 MG 24 hr tablet Take 300 mg by mouth daily.    . Calcium Carb-Cholecalciferol (CALCIUM + D3 PO) Take 1 tablet by mouth every morning.    . Coenzyme Q10 (COQ10) 200 MG CAPS Take 200 mg by mouth daily.    . ferrous sulfate 325 (65 FE) MG EC tablet Take 325 mg by mouth daily.    . fexofenadine (ALLEGRA) 180 MG tablet Take 180 mg by mouth daily.    . furosemide (LASIX) 20 MG tablet TAKE 1 TABLET(20 MG) BY MOUTH DAILY AS NEEDED 30 tablet 5  . losartan (COZAAR) 25 MG tablet Take 0.5  tablets (12.5 mg total) by mouth at bedtime. 15 tablet 3  . pantoprazole (PROTONIX) 40 MG tablet TAKE 1 TABLET(40 MG) BY MOUTH TWICE DAILY BEFORE A MEAL 180 tablet 1  . potassium chloride SA (KLOR-CON) 20 MEQ tablet Take 40 mEq by mouth every other day.     . rosuvastatin (CRESTOR) 10 MG tablet Take 1 tablet (10 mg total) by mouth daily. 90 tablet 3  . sotalol (BETAPACE) 160 MG tablet Take 1 tablet (160 mg total) by mouth in the morning AND 0.5 tablets (80 mg total) every evening. 45 tablet 6  . spironolactone (ALDACTONE) 25 MG tablet Take 1 tablet (25 mg total) by mouth daily. 30 tablet 11  . thyroid (ARMOUR) 120 MG tablet Take 120 mg by mouth daily before breakfast.    . traZODone (DESYREL) 50  MG tablet Take 0.5 tablets (25 mg total) by mouth at bedtime as needed for sleep. 30 tablet 0  . triamcinolone (NASACORT ALLERGY 24HR) 55 MCG/ACT AERO nasal inhaler Place 2 sprays into the nose daily.     Carlena Hurl 20 MG TABS tablet TAKE 1 TABLET BY MOUTH EVERY DAY WITH SUPPER 90 tablet 1   Current Facility-Administered Medications on File Prior to Encounter  Medication Dose Route Frequency Provider Last Rate Last Admin  . bivalirudin (ANGIOMAX) 250 mg in sodium chloride 0.9 % 50 mL (5 mg/mL) infusion    Continuous PRN Kathleene Hazel, MD   Stopped at 09/20/15 [phone]  . bivalirudin (ANGIOMAX) BOLUS via infusion    PRN Kathleene Hazel, MD   54.45 mg at 09/20/15 3716  . fentaNYL (SUBLIMAZE) injection    PRN Kathleene Hazel, MD   25 mcg at 09/20/15 0651  . heparin 1,500 mL    PRN Kathleene Hazel, MD   Given at 09/20/15 [phone]  . heparin infusion 2 units/mL in 0.9 % sodium chloride    Continuous PRN Kathleene Hazel, MD   1,500 mL at 09/20/15 0733  . iopamidol (ISOVUE-370) 76 % injection    PRN Kathleene Hazel, MD   165 mL at 09/20/15 0733  . lidocaine (PF) (XYLOCAINE) 1 % injection    PRN Kathleene Hazel, MD   2 mL at 09/20/15 9381  . midazolam (VERSED)  injection    PRN Kathleene Hazel, MD   1 mg at 09/20/15 0651  . nitroGLYCERIN 1 mg/10 ml (100 mcg/ml) - IR/CATH LAB    PRN Kathleene Hazel, MD   200 mcg at 09/20/15 0175  . Radial Cocktail/Verapamil only    PRN Kathleene Hazel, MD   10 mL at 09/20/15 0629  . ticagrelor (BRILINTA) tablet    PRN Kathleene Hazel, MD   180 mg at 09/20/15 [phone]  . tirofiban (AGGRASTAT) bolus via infusion    PRN Kathleene Hazel, MD   1,815 mcg at 09/20/15 [phone]  . tirofiban (AGGRASTAT) infusion 50 mcg/mL 100 mL    Continuous PRN Kathleene Hazel, MD 13.1 mL/hr at 09/20/15 0651 0.15 mcg/kg/min at 09/20/15 0651    BP (!) 90/58   Pulse (!) 55   Wt 67.4 kg (148 lb 9.6 oz)   SpO2 98%   BMI 21.94 kg/m  General: NAD Neck: No JVD, no thyromegaly or thyroid nodule.  Lungs: Clear to auscultation bilaterally with normal respiratory effort. CV: Nondisplaced PMI.  Heart regular S1/S2, no S3/S4, no murmur.  No peripheral edema.  No carotid bruit.  Normal pedal pulses.  Abdomen: Soft, nontender, no hepatosplenomegaly, no distention.  Skin: Intact without lesions or rashes.  Neurologic: Alert and oriented x 3.  Psych: Normal affect. Extremities: No clubbing or cyanosis.  HEENT: Normal.   Assessment/Plan: 1. CAD: S/p anterior MI in 7/17 with DES to RCA.  Cath in 10/19 with no obstructive CAD.  No further chest pain.  - She is off Plavix now and taking only Xarelto 20 mg daily.  - She is tolerating Crestor without myalgias.  Check lipids today.       2. Chronic systolic CHF: Ischemic cardiomyopathy.  EF 25% on echo in 10/17, 30-35% on echo in 11/17, 30% on TEE in 8/18. She has extensive LAD-territory scar. 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  Echo in 12/20 showed that EF remains 30% with LAD-territory scar.  She is not volume overloaded on exam  or by Corvue. NYHA class II symptoms.  No BP room to adjust meds today.  - Off Coreg with low BP and addition of sotalol.   - Continue losartan 12.5 mg  qhs.   - She did not tolerate dapagliflozin due to yeast infections.  - Continue Lasix 20 mg daily.    - Continue spironolactone 25 mg daily.  BMET today.  - QRS does not appear to be wide enough that she would have benefit from CRT.  - Repeat echo at followup in 3 months.  3. Mitral regurgitation: Moderate to severe on 10/17 echo, severe on 11/17 echo.  I did a TEE in 8/18.  This showed moderate MR that appeared to be functional.  No indication for surgery or Mitraclip. Echo 12/20 showed moderate MR, functional.  4. Atrial fibrillation: Unable to tolerate amiodarone due to GI side effects.  She is in NSR today on Xarelto 20 mg daily and sotalol. QTc interval is acceptable on sotalol on today's ECG.    5. PVCs: Very frequent on 12/18 holter (41% beats), she has had a PVC ablation in 2/19. Due to recurrence of PVCs post-ablation, she is now on sotalol. She does not seem to have many PVCs.  - Continue sotalol, QTc ok on ECG today.  6. Hyperlipidemia: Tolerating Crestor, check lipids.  7. Fe deficiency anemia: Associated with dark stool/melena.  Hgb down to 8 at one point, has had IV Fe and transfusion.  Hgb up to 11.9 most recently, has felt better.  C-scope, capsule endoscopy, and EGD not revealing of definite cause. - CBC today.   Followup 3 months with echo.   Marca Ancona 12/21/2019

## 2019-12-26 NOTE — Progress Notes (Signed)
Remote ICD transmission.   

## 2020-01-22 ENCOUNTER — Telehealth: Payer: Self-pay | Admitting: Gastroenterology

## 2020-01-22 DIAGNOSIS — D509 Iron deficiency anemia, unspecified: Secondary | ICD-10-CM

## 2020-01-22 NOTE — Telephone Encounter (Signed)
Received and reviewed labs completed at [facility] in [place] drawn on 01/13/2020.  CBC: WBC 4.6, hemoglobin 11.6 (L), MCV 97.4, MCH 30.1, MCHC 30.9 (L), platelets 189 Ferritin 11.7 (down from 69 in July 2021) IgA 138 TTG IgA pending  Helmut Muster, please let patient know I have received and reviewed her labs.  Her hemoglobin is still low but stable/improved at 11.6.  Her ferritin which reflects iron stores has declined from 69 in July 2021 down to 11.7.  Celiac serologies are pending.  Is she still taking oral iron daily?  Helmut Muster, can you call [facility] in [place] to see if celiac serologies have resulted?

## 2020-01-23 ENCOUNTER — Other Ambulatory Visit: Payer: Self-pay

## 2020-01-23 ENCOUNTER — Telehealth: Payer: Self-pay | Admitting: Internal Medicine

## 2020-01-23 DIAGNOSIS — R790 Abnormal level of blood mineral: Secondary | ICD-10-CM

## 2020-01-23 NOTE — Telephone Encounter (Signed)
Lmom for pt, waiting on a return call. Spoke with [facility] and results will be faxed and placed in Ermalinda Memos, PA's office.

## 2020-01-23 NOTE — Telephone Encounter (Signed)
Returning call, [phone]

## 2020-01-23 NOTE — Telephone Encounter (Signed)
TTG IgA <2 (negative). No evidence of celiac disease.   Please let me know patient's response to iron once she returns your call.

## 2020-01-23 NOTE — Telephone Encounter (Signed)
Spoke with pt. Pt was notified that results were reviewed. Discussed results with pt. Pt isn't taking an iron supplement at this time. Please advise next step with low ferritin level.

## 2020-01-23 NOTE — Telephone Encounter (Signed)
Left a detailed message for pt. Pt was advised to start ferrous sulfate 325 mg daily back with repeat iron panel in 8 weeks. Pt advised to call back with any questions or concerns.

## 2020-01-23 NOTE — Telephone Encounter (Signed)
She needs to resume ferrous sulfate 325 mg daily with repeat iron panel in 8 weeks.

## 2020-01-23 NOTE — Telephone Encounter (Signed)
Noted  

## 2020-01-24 NOTE — Telephone Encounter (Signed)
Pt called back and would like to know if there is anything else she can do in the place of iron. Pt states the iron causes severe constipation and she didn't know if an iron infusion would help in the place of iron?

## 2020-01-24 NOTE — Telephone Encounter (Signed)
Spoke with pt. Pt would prefer referral to hematology for iron infusions. [facility]-please arrnage.

## 2020-01-24 NOTE — Telephone Encounter (Signed)
We can refer her to hematology for iron infusions if she would like.

## 2020-01-24 NOTE — Addendum Note (Signed)
Addended by: Armstead Peaks on: 01/24/2020 03:21 PM   Modules accepted: Orders

## 2020-01-24 NOTE — Telephone Encounter (Signed)
Referral placed to hematology

## 2020-02-06 ENCOUNTER — Other Ambulatory Visit: Payer: Self-pay

## 2020-02-06 DIAGNOSIS — R790 Abnormal level of blood mineral: Secondary | ICD-10-CM

## 2020-02-09 NOTE — Progress Notes (Signed)
[facility] CONSULT NOTE  Patient Care Team: Benita Stabile, MD as PCP - General (Internal Medicine) Regan Lemming, MD as PCP - Electrophysiology (Cardiology) Kathleene Hazel, MD as PCP - Cardiology (Cardiology) Jena Gauss Gerrit Friends, MD as Consulting Physician (Gastroenterology)  CHIEF COMPLAINTS/PURPOSE OF CONSULTATION:  Anemia  ASSESSMENT & PLAN:  No problem-specific Assessment & Plan notes found for this encounter.  This is a very pleasant 54 yr old female patient with PMH of CAD with MI referred to hematology for evaluation and recommendations regarding IDA. Catherine Mays is here with her husband. She was found to have IDA, had EGD and colonoscopy, no bleeding lesions noted on the procedure. She was given IV iron, and started on oral iron. She felt well after IV iron, didn't however take oral iron. I recommended that we repeat blood work today which demonstrated IDA, likely from malabsorption vs chronic GI blood loss. She may benefit from small bowel evaluation. We will give her some ferraheme given severe IDA and given intolerance to oral iron. She has previously received IV iron, didn't have any side effects from it.  She will return to clinic after the lab evaluation and we will discuss additional recommendations. Age appropriate cancer screening recommended.  Orders Placed This Encounter  Procedures  . CBC with Differential/Platelet    Standing Status:   Standing    Number of Occurrences:   22    Standing Expiration Date:   02/09/2021  . Pathologist smear review    Standing Status:   Future    Number of Occurrences:   1    Standing Expiration Date:   02/09/2021  . Iron and TIBC    Standing Status:   Future    Number of Occurrences:   1    Standing Expiration Date:   02/09/2021  . Ferritin    Standing Status:   Future    Number of Occurrences:   1    Standing Expiration Date:   02/09/2021  . TSH    Standing Status:   Standing    Number of  Occurrences:   22    Standing Expiration Date:   02/09/2021  . Vitamin B12    Standing Status:   Future    Number of Occurrences:   1    Standing Expiration Date:   02/09/2021  . Folate RBC    Standing Status:   Future    Number of Occurrences:   1    Standing Expiration Date:   02/09/2021  . Lactate dehydrogenase    Standing Status:   Future    Number of Occurrences:   1    Standing Expiration Date:   02/09/2021  . Reticulocytes    Standing Status:   Future    Number of Occurrences:   1    Standing Expiration Date:   02/09/2021  . SPEP (Serum protein electrophoresis)    Standing Status:   Future    Number of Occurrences:   1    Standing Expiration Date:   02/09/2021  . Kappa/lambda light chains    Standing Status:   Standing    Number of Occurrences:   22    Standing Expiration Date:   02/09/2021  . Hemoglobin A1c    Standing Status:   Future    Number of Occurrences:   1    Standing Expiration Date:   02/09/2021     HISTORY OF PRESENTING ILLNESS:   Catherine Mays 54 y.o. female  is here because of IDA. She was found to be anemic in early 2021, evaluation showed that she is FOBT positive. Colonoscopy showed diverticulosis, no bleeding lesions noted. Subsequently she was started on iron which she took briefly and then quit because she didn't feel well when she took iron. She denies any known hematochezia or melena. No vaginal bleeding or hematuria. She feels tires, SOB on exertion, no PICA. She previously had IV iron and she felt well after this. She was hoping to get IV iron again. Rest of the pertinent 10 point ROS reviewed and negative.  REVIEW OF SYSTEMS:   Constitutional: Denies fevers, chills or abnormal night sweats Eyes: Denies blurriness of vision, double vision or watery eyes Ears, nose, mouth, throat, and face: Denies mucositis or sore throat Respiratory: Denies cough, dyspnea or wheezes Cardiovascular: Denies palpitation, chest discomfort or lower extremity  swelling Gastrointestinal:  Denies nausea, heartburn or change in bowel habits Skin: Denies abnormal skin rashes Lymphatics: Denies new lymphadenopathy or easy bruising Neurological:Denies numbness, tingling or new weaknesses Behavioral/Psych: Mood is stable, no new changes  All other systems were reviewed with the patient and are negative.  MEDICAL HISTORY:  Past Medical History:  Diagnosis Date  . AICD (automatic cardioverter/defibrillator) present    St. Jude  . CAD in native artery    a. late recognition of presentation of STEMI 08/2015 s/p DES to LAD, mild residual mRCA.  Marland Kitchen Chronic systolic CHF (congestive heart failure) (HCC)   . CKD (chronic kidney disease), stage III (HCC)   . Hypotension    a. preventing med titration for HF.  Marland Kitchen Hypothyroidism 09/24/2015  . Ischemic cardiomyopathy   . Myocardial infarction (HCC) 08/2015  . PAF (paroxysmal atrial fibrillation) (HCC) 09/24/2015   a. dx at time of STEMI 08/2015.  . Pre-diabetes   . Presence of permanent cardiac pacemaker    patient has ICD  . PVC's (premature ventricular contractions)     SURGICAL HISTORY: Past Surgical History:  Procedure Laterality Date  . BIOPSY  06/16/2019   Procedure: BIOPSY;  Surgeon: Corbin Ade, MD;  Location: [facility];  Service: Endoscopy;;  . CARDIAC CATHETERIZATION N/A 09/20/2015   Procedure: Left Heart Cath and Coronary Angiography;  Surgeon: Kathleene Hazel, MD;  Location: [facility] INVASIVE CV LAB;  Service: Cardiovascular;  Laterality: N/A;  . CARDIAC CATHETERIZATION N/A 09/20/2015   Procedure: Coronary Stent Intervention;  Surgeon: Kathleene Hazel, MD;  Location: [facility] INVASIVE CV LAB;  Service: Cardiovascular;  Laterality: N/A;  . CARDIAC CATHETERIZATION N/A 09/21/2015   Procedure: Left Heart Cath and Coronary Angiography;  Surgeon: Tonny Bollman, MD;  Location: [facility] INVASIVE CV LAB;  Service: Cardiovascular;  Laterality: N/A;  . CARDIAC CATHETERIZATION N/A 11/26/2015   Procedure:  Right Heart Cath;  Surgeon: [name], MD;  Location: [facility] INVASIVE CV LAB;  Service: Cardiovascular;  Laterality: N/A;  . COLONOSCOPY WITH PROPOFOL N/A 11/23/2017   Dr. Jena Gauss: Diverticulosis, internal hemorrhoids, next colonoscopy in 10 years  . COLONOSCOPY WITH PROPOFOL N/A 08/10/2019   Procedure: COLONOSCOPY WITH PROPOFOL;  Surgeon: Corbin Ade, MD;  Diverticulosis in sigmoid colon, nonbleeding internal hemorrhoids, otherwise normal exam.    . CORONARY STENT PLACEMENT  09/20/2015  . EP IMPLANTABLE DEVICE N/A 01/23/2016   Procedure: ICD Implant;  Surgeon: Will Jorja Loa, MD;  Location: [facility] INVASIVE CV LAB;  Service: Cardiovascular;  Laterality: N/A;  . ESOPHAGOGASTRODUODENOSCOPY (EGD) WITH PROPOFOL N/A 06/16/2019   Procedure: ESOPHAGOGASTRODUODENOSCOPY (EGD) WITH PROPOFOL;  Surgeon: Corbin Ade, MD;  Normal esophagus, small hiatal hernia, normal examined stomach, normal examined duodenum.  Gastric biopsy with slight chronic inflammation, duodenal biopsy with slight intramucosal Brunner gland hyperplasia.  Marland Kitchen GIVENS CAPSULE STUDY N/A 11/23/2019   Procedure: GIVENS CAPSULE STUDY;  Surgeon: Corbin Ade, MD;  Location: [facility];  Service: Endoscopy;  Laterality: N/A;  7:30am  . LEFT HEART CATH AND CORONARY ANGIOGRAPHY N/A 12/18/2017   Procedure: LEFT HEART CATH AND CORONARY ANGIOGRAPHY;  Surgeon: [name], MD;  Location: [facility] INVASIVE CV LAB;  Service: Cardiovascular;  Laterality: N/A;  . PVC ABLATION N/A 04/02/2017   Procedure: PVC ABLATION;  Surgeon: Regan Lemming, MD;  Location: [facility] INVASIVE CV LAB;  Service: Cardiovascular;  Laterality: N/A;  . TEE WITHOUT CARDIOVERSION N/A 10/08/2016   Procedure: TRANSESOPHAGEAL ECHOCARDIOGRAM (TEE);  Surgeon: [name], MD;  Location: [facility] ENDOSCOPY;  Service: Cardiovascular;  Laterality: N/A;  . THYROIDECTOMY      SOCIAL HISTORY: Social History   Socioeconomic History  . Marital status: Married    Spouse name: Not on  file  . Number of children: Not on file  . Years of education: Not on file  . Highest education level: Not on file  Occupational History  . Not on file  Tobacco Use  . Smoking status: Never Smoker  . Smokeless tobacco: Never Used  Vaping Use  . Vaping Use: Never used  Substance and Sexual Activity  . Alcohol use: No  . Drug use: No  . Sexual activity: Not on file  Other Topics Concern  . Not on file  Social History Narrative  . Not on file   Social Determinants of Health   Financial Resource Strain: Low Risk   . Difficulty of Paying Living Expenses: Not hard at all  Food Insecurity: No Food Insecurity  . Worried About Programme researcher, broadcasting/film/video in the Last Year: Never true  . Ran Out of Food in the Last Year: Never true  Transportation Needs: No Transportation Needs  . Lack of Transportation (Medical): No  . Lack of Transportation (Non-Medical): No  Physical Activity: Insufficiently Active  . Days of Exercise per Week: 3 days  . Minutes of Exercise per Session: 30 min  Stress: No Stress Concern Present  . Feeling of Stress : Not at all  Social Connections: Socially Integrated  . Frequency of Communication with Friends and Family: More than three times a week  . Frequency of Social Gatherings with Friends and Family: More than three times a week  . Attends Religious Services: More than 4 times per year  . Active Member of Clubs or Organizations: Yes  . Attends Banker Meetings: More than 4 times per year  . Marital Status: Married  Catering manager Violence: Not At Risk  . Fear of Current or Ex-Partner: No  . Emotionally Abused: No  . Physically Abused: No  . Sexually Abused: No    FAMILY HISTORY: Family History  Problem Relation Age of Onset  . Heart attack Father   . Arrhythmia Mother   . Lung cancer Mother        bronchiectasis  . Colon cancer Neg Hx   . Colon polyps Neg Hx     ALLERGIES:  is allergic to paxil [paroxetine], morphine and related,  percocet [oxycodone-acetaminophen], cefdinir, and zolpidem.  MEDICATIONS:  Current Outpatient Medications  Medication Sig Dispense Refill  . buPROPion (WELLBUTRIN XL) 300 MG 24 hr tablet Take 300 mg by mouth daily.    . Calcium  Carb-Cholecalciferol (CALCIUM + D3 PO) Take 1 tablet by mouth every morning.    . Coenzyme Q10 (COQ10) 200 MG CAPS Take 200 mg by mouth daily.    . fexofenadine (ALLEGRA) 180 MG tablet Take 180 mg by mouth daily.    Marland Kitchen losartan (COZAAR) 25 MG tablet Take 0.5 tablets (12.5 mg total) by mouth at bedtime. 15 tablet 3  . pantoprazole (PROTONIX) 40 MG tablet TAKE 1 TABLET(40 MG) BY MOUTH TWICE DAILY BEFORE A MEAL 180 tablet 1  . potassium chloride SA (KLOR-CON) 20 MEQ tablet Take 40 mEq by mouth every other day.     . rosuvastatin (CRESTOR) 10 MG tablet Take 1 tablet (10 mg total) by mouth daily. 90 tablet 3  . sotalol (BETAPACE) 160 MG tablet Take 1 tablet (160 mg total) by mouth in the morning AND 0.5 tablets (80 mg total) every evening. 45 tablet 6  . spironolactone (ALDACTONE) 25 MG tablet Take 1 tablet (25 mg total) by mouth daily. 30 tablet 11  . thyroid (ARMOUR) 120 MG tablet Take 120 mg by mouth daily before breakfast.    . triamcinolone (NASACORT) 55 MCG/ACT AERO nasal inhaler Place 2 sprays into the nose daily.     Carlena Hurl 20 MG TABS tablet TAKE 1 TABLET BY MOUTH EVERY DAY WITH SUPPER 90 tablet 1  . furosemide (LASIX) 20 MG tablet TAKE 1 TABLET(20 MG) BY MOUTH DAILY AS NEEDED (Patient not taking: Reported on 02/10/2020) 30 tablet 5  . nitroGLYCERIN (NITROSTAT) 0.4 MG SL tablet Place 1 tablet (0.4 mg total) under the tongue every 5 (five) minutes as needed for chest pain. (Patient not taking: Reported on 02/10/2020) 25 tablet 3  . traZODone (DESYREL) 50 MG tablet Take 0.5 tablets (25 mg total) by mouth at bedtime as needed for sleep. (Patient not taking: Reported on 02/10/2020) 30 tablet 0   No current facility-administered medications for this visit.    Facility-Administered Medications Ordered in Other Visits  Medication Dose Route Frequency Provider Last Rate Last Admin  . bivalirudin (ANGIOMAX) 250 mg in sodium chloride 0.9 % 50 mL (5 mg/mL) infusion    Continuous PRN Kathleene Hazel, MD   Stopped at 09/20/15 [phone]  . bivalirudin (ANGIOMAX) BOLUS via infusion    PRN Kathleene Hazel, MD   54.45 mg at 09/20/15 [phone]  . fentaNYL (SUBLIMAZE) injection    PRN Kathleene Hazel, MD   25 mcg at 09/20/15 0651  . heparin 1,500 mL    PRN Kathleene Hazel, MD   Given at 09/20/15 [phone]  . heparin infusion 2 units/mL in 0.9 % sodium chloride    Continuous PRN Kathleene Hazel, MD   1,500 mL at 09/20/15 0733  . iopamidol (ISOVUE-370) 76 % injection    PRN Kathleene Hazel, MD   165 mL at 09/20/15 0733  . lidocaine (PF) (XYLOCAINE) 1 % injection    PRN Kathleene Hazel, MD   2 mL at 09/20/15 7412  . midazolam (VERSED) injection    PRN Kathleene Hazel, MD   1 mg at 09/20/15 0651  . nitroGLYCERIN 1 mg/10 ml (100 mcg/ml) - IR/CATH LAB    PRN Kathleene Hazel, MD   200 mcg at 09/20/15 8786  . Radial Cocktail/Verapamil only    PRN Kathleene Hazel, MD   10 mL at 09/20/15 0629  . ticagrelor (BRILINTA) tablet    PRN Kathleene Hazel, MD   180 mg at 09/20/15 [phone]  . tirofiban (AGGRASTAT)  bolus via infusion    PRN Kathleene Hazel, MD   1,815 mcg at 09/20/15 [phone]  . tirofiban (AGGRASTAT) infusion 50 mcg/mL 100 mL    Continuous PRN Kathleene Hazel, MD 13.1 mL/hr at 09/20/15 0651 0.15 mcg/kg/min at 09/20/15 9407     PHYSICAL EXAMINATION: ECOG PERFORMANCE STATUS: 1 - Symptomatic but completely ambulatory  Vitals:   02/10/20 0945  BP: 91/62  Pulse: (!) 58  Resp: 18  Temp: 98.3 F (36.8 C)  SpO2: 100%   Filed Weights   02/10/20 0945  Weight: 149 lb 3.2 oz (67.7 kg)    GENERAL:alert, no distress and comfortable SKIN: skin color, texture, turgor are normal, no  rashes or significant lesions EYES: normal, conjunctiva are pink and non-injected, sclera clear OROPHARYNX:no exudate, no erythema and lips, buccal mucosa, and tongue normal  NECK: supple, thyroid normal size, non-tender, without nodularity LYMPH:  no palpable lymphadenopathy in the cervical, axillary or inguinal LUNGS: clear to auscultation and percussion with normal breathing effort HEART: regular rate & rhythm and no murmurs and no lower extremity edema ABDOMEN:abdomen soft, non-tender and normal bowel sounds Musculoskeletal:no cyanosis of digits and no clubbing  PSYCH: alert & oriented x 3 with fluent speech NEURO: no focal motor/sensory deficits  LABORATORY DATA:  I have reviewed the data as listed Lab Results  Component Value Date   WBC 4.1 02/10/2020   HGB 10.1 (L) 02/10/2020   HCT 30.8 (L) 02/10/2020   MCV 93.9 02/10/2020   PLT 198 02/10/2020     Chemistry      Component Value Date/Time   NA 138 12/21/2019 0947   NA 141 10/15/2017 1039   K 4.5 12/21/2019 0947   CL 103 12/21/2019 0947   CO2 26 12/21/2019 0947   BUN 24 (H) 12/21/2019 0947   BUN 22 10/15/2017 1039   CREATININE 1.38 (H) 12/21/2019 0947   CREATININE 1.27 (H) 01/08/2016 1325      Component Value Date/Time   CALCIUM 9.0 12/21/2019 0947   ALKPHOS 48 08/01/2019 1108   AST 17 08/01/2019 1108   ALT 14 08/01/2019 1108   BILITOT 0.8 08/01/2019 1108       RADIOGRAPHIC STUDIES: I have personally reviewed the radiological images as listed and agreed with the findings in the report. No results found.  All questions were answered. The patient knows to call the clinic with any problems, questions or concerns. I spent 45 minutes in the care of this patient, including H and P, review of records, counseling and coordination of care.    Rachel Moulds, MD 02/12/2020 11:50 PM

## 2020-02-10 ENCOUNTER — Other Ambulatory Visit: Payer: Self-pay

## 2020-02-10 ENCOUNTER — Inpatient Hospital Stay (HOSPITAL_COMMUNITY): Payer: BC Managed Care – PPO

## 2020-02-10 ENCOUNTER — Encounter (HOSPITAL_COMMUNITY): Payer: Self-pay | Admitting: Hematology and Oncology

## 2020-02-10 ENCOUNTER — Other Ambulatory Visit (HOSPITAL_COMMUNITY): Payer: Self-pay | Admitting: Hematology and Oncology

## 2020-02-10 ENCOUNTER — Inpatient Hospital Stay (HOSPITAL_COMMUNITY): Payer: BC Managed Care – PPO | Attending: Hematology and Oncology | Admitting: Hematology and Oncology

## 2020-02-10 VITALS — BP 91/62 | HR 58 | Temp 98.3°F | Resp 18 | Ht 69.0 in | Wt 149.2 lb

## 2020-02-10 DIAGNOSIS — N183 Chronic kidney disease, stage 3 unspecified: Secondary | ICD-10-CM | POA: Diagnosis not present

## 2020-02-10 DIAGNOSIS — D649 Anemia, unspecified: Secondary | ICD-10-CM

## 2020-02-10 DIAGNOSIS — Z950 Presence of cardiac pacemaker: Secondary | ICD-10-CM

## 2020-02-10 DIAGNOSIS — I480 Paroxysmal atrial fibrillation: Secondary | ICD-10-CM | POA: Insufficient documentation

## 2020-02-10 DIAGNOSIS — D509 Iron deficiency anemia, unspecified: Secondary | ICD-10-CM | POA: Diagnosis present

## 2020-02-10 DIAGNOSIS — Z79899 Other long term (current) drug therapy: Secondary | ICD-10-CM | POA: Diagnosis not present

## 2020-02-10 DIAGNOSIS — I252 Old myocardial infarction: Secondary | ICD-10-CM | POA: Insufficient documentation

## 2020-02-10 DIAGNOSIS — Z7901 Long term (current) use of anticoagulants: Secondary | ICD-10-CM | POA: Diagnosis not present

## 2020-02-10 DIAGNOSIS — E039 Hypothyroidism, unspecified: Secondary | ICD-10-CM | POA: Insufficient documentation

## 2020-02-10 LAB — CBC WITH DIFFERENTIAL/PLATELET
Abs Immature Granulocytes: 0.01 10*3/uL (ref 0.00–0.07)
Basophils Absolute: 0 10*3/uL (ref 0.0–0.1)
Basophils Relative: 1 %
Eosinophils Absolute: 0.1 10*3/uL (ref 0.0–0.5)
Eosinophils Relative: 2 %
HCT: 32.5 % — ABNORMAL LOW (ref 36.0–46.0)
Hemoglobin: 10.1 g/dL — ABNORMAL LOW (ref 12.0–15.0)
Immature Granulocytes: 0 %
Lymphocytes Relative: 32 %
Lymphs Abs: 1.3 10*3/uL (ref 0.7–4.0)
MCH: 29.2 pg (ref 26.0–34.0)
MCHC: 31.1 g/dL (ref 30.0–36.0)
MCV: 93.9 fL (ref 80.0–100.0)
Monocytes Absolute: 0.5 10*3/uL (ref 0.1–1.0)
Monocytes Relative: 11 %
Neutro Abs: 2.2 10*3/uL (ref 1.7–7.7)
Neutrophils Relative %: 54 %
Platelets: 198 10*3/uL (ref 150–400)
RBC: 3.46 MIL/uL — ABNORMAL LOW (ref 3.87–5.11)
RDW: 12.4 % (ref 11.5–15.5)
WBC: 4.1 10*3/uL (ref 4.0–10.5)
nRBC: 0 % (ref 0.0–0.2)

## 2020-02-10 LAB — IRON AND TIBC
Iron: 36 ug/dL (ref 28–170)
Saturation Ratios: 8 % — ABNORMAL LOW (ref 10.4–31.8)
TIBC: 466 ug/dL — ABNORMAL HIGH (ref 250–450)
UIBC: 430 ug/dL

## 2020-02-10 LAB — RETICULOCYTES
Immature Retic Fract: 10 % (ref 2.3–15.9)
RBC.: 3.49 MIL/uL — ABNORMAL LOW (ref 3.87–5.11)
Retic Count, Absolute: 49.6 10*3/uL (ref 19.0–186.0)
Retic Ct Pct: 1.4 % (ref 0.4–3.1)

## 2020-02-10 LAB — LACTATE DEHYDROGENASE: LDH: 162 U/L (ref 98–192)

## 2020-02-10 LAB — TSH: TSH: 0.415 u[IU]/mL (ref 0.350–4.500)

## 2020-02-10 LAB — HEMOGLOBIN A1C
Hgb A1c MFr Bld: 5.7 % — ABNORMAL HIGH (ref 4.8–5.6)
Mean Plasma Glucose: 116.89 mg/dL

## 2020-02-10 LAB — FERRITIN: Ferritin: 8 ng/mL — ABNORMAL LOW (ref 11–307)

## 2020-02-10 LAB — VITAMIN B12: Vitamin B-12: 514 pg/mL (ref 180–914)

## 2020-02-12 LAB — FOLATE RBC
Folate, Hemolysate: 383 ng/mL
Folate, RBC: 1244 ng/mL (ref >498)
Hematocrit: 30.8 % — ABNORMAL LOW (ref 34.0–46.6)

## 2020-02-13 LAB — PROTEIN ELECTROPHORESIS, SERUM
A/G Ratio: 1.4 (ref 0.7–1.7)
Albumin ELP: 3.7 g/dL (ref 2.9–4.4)
Alpha-1-Globulin: 0.3 g/dL (ref 0.0–0.4)
Alpha-2-Globulin: 0.7 g/dL (ref 0.4–1.0)
Beta Globulin: 0.9 g/dL (ref 0.7–1.3)
Gamma Globulin: 0.8 g/dL (ref 0.4–1.8)
Globulin, Total: 2.7 g/dL (ref 2.2–3.9)
Total Protein ELP: 6.4 g/dL (ref 6.0–8.5)

## 2020-02-13 LAB — KAPPA/LAMBDA LIGHT CHAINS
Kappa free light chain: 18.1 mg/L (ref 3.3–19.4)
Kappa, lambda light chain ratio: 1.21 (ref 0.26–1.65)
Lambda free light chains: 14.9 mg/L (ref 5.7–26.3)

## 2020-02-13 LAB — PATHOLOGIST SMEAR REVIEW

## 2020-02-15 ENCOUNTER — Other Ambulatory Visit: Payer: Self-pay

## 2020-02-15 NOTE — Progress Notes (Signed)
.   Intravenous Iron Formulation Change  Catherine Mays has insurance that requires a change in intravenous iron product from Feraheme to Venofer. Orders have been updated to reflect this change and scheduling message sent to adjust infusion appointments. Dr Al Pimple notified and agrees with the plan.  Allergies:  Allergies  Allergen Reactions  . Paxil [Paroxetine] Palpitations  . Morphine And Related Nausea And Vomiting  . Percocet [Oxycodone-Acetaminophen] Nausea And Vomiting  . Cefdinir Nausea Only  . Zolpidem Other (See Comments)    Doesn't work for patient at all    The plan for iron therapy is as follows: Venofer 200 mg IVPB x 5 doses.  Stephens Shire 02/15/2020

## 2020-02-17 ENCOUNTER — Other Ambulatory Visit: Payer: Self-pay | Admitting: Gastroenterology

## 2020-02-20 ENCOUNTER — Telehealth: Payer: Self-pay | Admitting: Gastroenterology

## 2020-02-20 ENCOUNTER — Encounter: Payer: Self-pay | Admitting: Internal Medicine

## 2020-02-20 NOTE — Telephone Encounter (Signed)
Reviewed chart. Patient has established with hematology for IDA.     Stacey: Please arrange follow-up for GERD in February 2022.

## 2020-02-23 ENCOUNTER — Encounter: Payer: Self-pay | Admitting: Cardiology

## 2020-02-23 ENCOUNTER — Other Ambulatory Visit: Payer: Self-pay

## 2020-02-23 ENCOUNTER — Ambulatory Visit: Payer: BC Managed Care – PPO | Admitting: Cardiology

## 2020-02-23 VITALS — BP 94/66 | HR 65 | Ht 69.0 in | Wt 148.0 lb

## 2020-02-23 DIAGNOSIS — I255 Ischemic cardiomyopathy: Secondary | ICD-10-CM

## 2020-02-23 DIAGNOSIS — I480 Paroxysmal atrial fibrillation: Secondary | ICD-10-CM

## 2020-02-23 DIAGNOSIS — Z79899 Other long term (current) drug therapy: Secondary | ICD-10-CM

## 2020-02-23 NOTE — Progress Notes (Signed)
Electrophysiology Office Note   Date:  02/23/2020   ID:  [name], DOB Aug 06, 1965, MRN [id number]  PCP:  Benita Stabile, MD  Cardiologist:  Janett Labella Primary Electrophysiologist:  Regan Lemming, MD    No chief complaint on file.    History of Present Illness: Catherine Mays is a 54 y.o. female who presents today for electrophysiology evaluation.     She has a history of coronary artery disease status post STEMI in 2017 with drug-eluting stent to the mid LAD, ischemic cardiomyopathy, chronic systolic heart failure, paroxysmal atrial fibrillation found at the time of her STEMI, ABCs, hypotension.  She presented to [facility] emergency room with chest pain and was sent home.  She came back later that night with more chest pain and was found to have ST elevations with an anterior MI.  She was emergently transported to [facility] and had a occluded mid LAD with drug-eluting stent x1 on 09/20/2015.  She was discharged with a LifeVest for LV dysfunction.  She was started on amiodarone for her atrial fibrillation.  She was taken off of amiodarone due to poor appetite and a 20 pound weight loss.  She had a Saint Jude ICD implanted 01/23/2016.  She was found to have a high PVC burden on her Holter monitor of 41%.  She had a PVC ablation.  This resulted in a decrease in her PVC burden.  She has since been started on sotalol.  Today, denies symptoms of palpitations, chest pain, shortness of breath, orthopnea, PND, lower extremity edema, claudication, dizziness, presyncope, syncope, bleeding, or neurologic sequela. The patient is tolerating medications without difficulties.  Since last being seen she has done well.  She is able to do all of her daily activities.  Unfortunately she has been diagnosed with iron deficiency anemia.  She has had a full GI work-up which was unrevealing.  She has plans for iron infusions.  Aside from that she has no complaints.  Past Medical History:   Diagnosis Date  . AICD (automatic cardioverter/defibrillator) present    St. Jude  . CAD in native artery    a. late recognition of presentation of STEMI 08/2015 s/p DES to LAD, mild residual mRCA.  Marland Kitchen Chronic systolic CHF (congestive heart failure) (HCC)   . CKD (chronic kidney disease), stage III (HCC)   . Hypotension    a. preventing med titration for HF.  Marland Kitchen Hypothyroidism 09/24/2015  . Ischemic cardiomyopathy   . Myocardial infarction (HCC) 08/2015  . PAF (paroxysmal atrial fibrillation) (HCC) 09/24/2015   a. dx at time of STEMI 08/2015.  . Pre-diabetes   . Presence of permanent cardiac pacemaker    patient has ICD  . PVC's (premature ventricular contractions)    Past Surgical History:  Procedure Laterality Date  . BIOPSY  06/16/2019   Procedure: BIOPSY;  Surgeon: Corbin Ade, MD;  [facility]: [facility];  Service: Endoscopy;;  . CARDIAC CATHETERIZATION N/A 09/20/2015   Procedure: Left Heart Cath and Coronary Angiography;  Surgeon: Kathleene Hazel, MD;  Location: [facility] INVASIVE CV LAB;  Service: Cardiovascular;  Laterality: N/A;  . CARDIAC CATHETERIZATION N/A 09/20/2015   Procedure: Coronary Stent Intervention;  Surgeon: Kathleene Hazel, MD;  Location: [facility] INVASIVE CV LAB;  Service: Cardiovascular;  Laterality: N/A;  . CARDIAC CATHETERIZATION N/A 09/21/2015   Procedure: Left Heart Cath and Coronary Angiography;  Surgeon: Tonny Bollman, MD;  Location: [facility] INVASIVE CV LAB;  Service: Cardiovascular;  Laterality: N/A;  . CARDIAC CATHETERIZATION N/A  11/26/2015   Procedure: Right Heart Cath;  Surgeon: [name], MD;  Location: [facility] INVASIVE CV LAB;  Service: Cardiovascular;  Laterality: N/A;  . COLONOSCOPY WITH PROPOFOL N/A 11/23/2017   Dr. Jena Gauss: Diverticulosis, internal hemorrhoids, next colonoscopy in 10 years  . COLONOSCOPY WITH PROPOFOL N/A 08/10/2019   Procedure: COLONOSCOPY WITH PROPOFOL;  Surgeon: Corbin Ade, MD;  Diverticulosis in sigmoid colon, nonbleeding  internal hemorrhoids, otherwise normal exam.    . CORONARY STENT PLACEMENT  09/20/2015  . EP IMPLANTABLE DEVICE N/A 01/23/2016   Procedure: ICD Implant;  Surgeon: Lomax Poehler Jorja Loa, MD;  Location: [facility] INVASIVE CV LAB;  Service: Cardiovascular;  Laterality: N/A;  . ESOPHAGOGASTRODUODENOSCOPY (EGD) WITH PROPOFOL N/A 06/16/2019   Procedure: ESOPHAGOGASTRODUODENOSCOPY (EGD) WITH PROPOFOL;  Surgeon: Corbin Ade, MD;  Normal esophagus, small hiatal hernia, normal examined stomach, normal examined duodenum.  Gastric biopsy with slight chronic inflammation, duodenal biopsy with slight intramucosal Brunner gland hyperplasia.  Marland Kitchen GIVENS CAPSULE STUDY N/A 11/23/2019   Procedure: GIVENS CAPSULE STUDY;  Surgeon: Corbin Ade, MD;  Location: [facility];  Service: Endoscopy;  Laterality: N/A;  7:30am  . LEFT HEART CATH AND CORONARY ANGIOGRAPHY N/A 12/18/2017   Procedure: LEFT HEART CATH AND CORONARY ANGIOGRAPHY;  Surgeon: [name], MD;  Location: [facility] INVASIVE CV LAB;  Service: Cardiovascular;  Laterality: N/A;  . PVC ABLATION N/A 04/02/2017   Procedure: PVC ABLATION;  Surgeon: Regan Lemming, MD;  Location: [facility] INVASIVE CV LAB;  Service: Cardiovascular;  Laterality: N/A;  . TEE WITHOUT CARDIOVERSION N/A 10/08/2016   Procedure: TRANSESOPHAGEAL ECHOCARDIOGRAM (TEE);  Surgeon: [name], MD;  Location: [facility] ENDOSCOPY;  Service: Cardiovascular;  Laterality: N/A;  . THYROIDECTOMY       Current Outpatient Medications  Medication Sig Dispense Refill  . buPROPion (WELLBUTRIN XL) 300 MG 24 hr tablet Take 300 mg by mouth daily.    . Calcium Carb-Cholecalciferol (CALCIUM + D3 PO) Take 1 tablet by mouth every morning.    . Coenzyme Q10 (COQ10) 200 MG CAPS Take 200 mg by mouth daily.    . fexofenadine (ALLEGRA) 180 MG tablet Take 180 mg by mouth daily.    . furosemide (LASIX) 20 MG tablet TAKE 1 TABLET(20 MG) BY MOUTH DAILY AS NEEDED 30 tablet 5  . losartan (COZAAR) 25 MG tablet Take 0.5 tablets  (12.5 mg total) by mouth at bedtime. 15 tablet 3  . nitroGLYCERIN (NITROSTAT) 0.4 MG SL tablet Place 1 tablet (0.4 mg total) under the tongue every 5 (five) minutes as needed for chest pain. 25 tablet 3  . pantoprazole (PROTONIX) 40 MG tablet TAKE 1 TABLET(40 MG) BY MOUTH TWICE DAILY BEFORE A MEAL 180 tablet 1  . potassium chloride SA (KLOR-CON) 20 MEQ tablet Take 40 mEq by mouth every other day.     . rosuvastatin (CRESTOR) 10 MG tablet Take 1 tablet (10 mg total) by mouth daily. 90 tablet 3  . sotalol (BETAPACE) 160 MG tablet Take 1 tablet (160 mg total) by mouth in the morning AND 0.5 tablets (80 mg total) every evening. 45 tablet 6  . spironolactone (ALDACTONE) 25 MG tablet Take 1 tablet (25 mg total) by mouth daily. 30 tablet 11  . thyroid (ARMOUR) 120 MG tablet Take 120 mg by mouth daily before breakfast.    . traZODone (DESYREL) 50 MG tablet Take 0.5 tablets (25 mg total) by mouth at bedtime as needed for sleep. 30 tablet 0  . triamcinolone (NASACORT) 55 MCG/ACT AERO nasal inhaler Place  2 sprays into the nose daily.     Carlena Hurl 20 MG TABS tablet TAKE 1 TABLET BY MOUTH EVERY DAY WITH SUPPER 90 tablet 1   No current facility-administered medications for this visit.   Facility-Administered Medications Ordered in Other Visits  Medication Dose Route Frequency Provider Last Rate Last Admin  . bivalirudin (ANGIOMAX) 250 mg in sodium chloride 0.9 % 50 mL (5 mg/mL) infusion    Continuous PRN Kathleene Hazel, MD   Stopped at 09/20/15 [phone]  . bivalirudin (ANGIOMAX) BOLUS via infusion    PRN Kathleene Hazel, MD   54.45 mg at 09/20/15 6761  . fentaNYL (SUBLIMAZE) injection    PRN Kathleene Hazel, MD   25 mcg at 09/20/15 0651  . heparin 1,500 mL    PRN Kathleene Hazel, MD   Given at 09/20/15 [phone]  . heparin infusion 2 units/mL in 0.9 % sodium chloride    Continuous PRN Kathleene Hazel, MD   1,500 mL at 09/20/15 0733  . iopamidol (ISOVUE-370) 76 % injection     PRN Kathleene Hazel, MD   165 mL at 09/20/15 0733  . lidocaine (PF) (XYLOCAINE) 1 % injection    PRN Kathleene Hazel, MD   2 mL at 09/20/15 3267  . midazolam (VERSED) injection    PRN Kathleene Hazel, MD   1 mg at 09/20/15 0651  . nitroGLYCERIN 1 mg/10 ml (100 mcg/ml) - IR/CATH LAB    PRN Kathleene Hazel, MD   200 mcg at 09/20/15 1245  . Radial Cocktail/Verapamil only    PRN Kathleene Hazel, MD   10 mL at 09/20/15 0629  . ticagrelor (BRILINTA) tablet    PRN Kathleene Hazel, MD   180 mg at 09/20/15 [phone]  . tirofiban (AGGRASTAT) bolus via infusion    PRN Kathleene Hazel, MD   1,815 mcg at 09/20/15 [phone]  . tirofiban (AGGRASTAT) infusion 50 mcg/mL 100 mL    Continuous PRN Kathleene Hazel, MD 13.1 mL/hr at 09/20/15 0651 0.15 mcg/kg/min at 09/20/15 2505    Allergies:   Paxil [paroxetine], Morphine and related, Percocet [oxycodone-acetaminophen], Cefdinir, and Zolpidem   Social History:  The patient  reports that she has never smoked. She has never used smokeless tobacco. She reports that she does not drink alcohol and does not use drugs.   Family History:  The patient's family history includes Arrhythmia in her mother; Heart attack in her father; Lung cancer in her mother.   ROS:  Please see the history of present illness.   Otherwise, review of systems is positive for none.   All other systems are reviewed and negative.   PHYSICAL EXAM: VS:  BP 94/66   Pulse 65   Ht 5\' 9"  (1.753 m)   Wt 148 lb (67.1 kg)   SpO2 100%   BMI 21.86 kg/m  , BMI Body mass index is 21.86 kg/m. GEN: Well nourished, well developed, in no acute distress  HEENT: normal  Neck: no JVD, carotid bruits, or masses Cardiac: RRR; no murmurs, rubs, or gallops,no edema  Respiratory:  clear to auscultation bilaterally, normal work of breathing GI: soft, nontender, nondistended, + BS MS: no deformity or atrophy  Skin: warm and dry, device site well healed Neuro:   Strength and sensation are intact Psych: euthymic mood, full affect  EKG:  EKG is ordered today. Personal review of the ekg ordered shows sinus rhythm right, rate 65  Personal review of the device  interrogation today. Results in Paceart   Recent Labs: 08/01/2019: ALT 14 12/21/2019: BUN 24; Creatinine, Ser 1.38; Potassium 4.5; Sodium 138 02/10/2020: Hemoglobin 10.1; Platelets 198; TSH 0.415    Lipid Panel     Component Value Date/Time   CHOL 124 12/21/2019 0947   TRIG 105 12/21/2019 0947   HDL 50 12/21/2019 0947   CHOLHDL 2.5 12/21/2019 0947   VLDL 21 12/21/2019 0947   LDLCALC 53 12/21/2019 0947     Wt Readings from Last 3 Encounters:  02/23/20 148 lb (67.1 kg)  02/10/20 149 lb 3.2 oz (67.7 kg)  12/21/19 148 lb 9.6 oz (67.4 kg)      Other studies Reviewed: Additional studies/ records that were reviewed today include: TEE 10/05/17 Review of the above records today demonstrates:  - Left ventricle: The cavity size was mildly dilated. Wall   thickness was normal. The estimated ejection fraction was 30%.   Akinesis of the inferoseptum and anteroseptum. Apical inferior   and apical anterior akinesis. Akinesis of the apex. Features are   consistent with a pseudonormal left ventricular filling pattern,   with concomitant abnormal relaxation and increased filling   pressure (grade 2 diastolic dysfunction). - Aortic valve: There was no stenosis. - Mitral valve: There was moderate regurgitation. - Left atrium: The atrium was moderately dilated. - Right ventricle: The cavity size was normal. Pacer wire or   catheter noted in right ventricle. Systolic function was normal. - Tricuspid valve: Peak RV-RA gradient (S): 34 mm Hg. - Pulmonary arteries: PA peak pressure: 37 mm Hg (S). - Inferior vena cava: The vessel was normal in size. The   respirophasic diameter changes were in the normal range (>= 50%),   consistent with normal central venous pressure.  Holter 01/26/17 - personally  reviewed Minimum HR: 58 BPM at 4:28:50 AM(2) Maximum HR: 113 BPM at 10:19:38 AM Average HR: 86 BPM Ventricular Beats: 100073 (41%) Sinus rhythm with PVCs and atrial runs  LHC 12/18/17 No obstructive coronary disease, patent LAD stent.   ASSESSMENT AND PLAN:  1.  Coronary artery disease: No current chest pain.  Continue current management.  2.  Paroxysmal atrial fibrillation: Noted on hospitalization.  No further episodes.  CHA2DS2-VASc of 3.  No changes.  3.  Ischemic cardiomyopathy: Status post [facility] Jude ICD.  Device functioning appropriately.  No changes.  4.  Nonrheumatic mitral regurgitation: Likely functional due to ischemic cardiomyopathy.  No changes.  5.  PVCs: Holter monitor with 41% burden.  Ablation of the posterior medial papillary muscle with decrease in burden.  Currently on sotalol.  Monitoring for high risk medication.  No changes.  Current medicines are reviewed at length with the patient today.   The patient does not have concerns regarding her medicines.  The following changes were made today: none  Labs/ tests ordered today include:  Orders Placed This Encounter  Procedures  . Magnesium  . EKG 12-Lead     Disposition:   FU with Locklan Canoy 12 months  Signed, Rj Pedrosa Jorja Loa, MD  02/23/2020 2:53 PM     [facility] [address] [place] [address] [phone] (office) [phone] (fax)

## 2020-02-23 NOTE — Patient Instructions (Signed)
Medication Instructions:  Your physician recommends that you continue on your current medications as directed. Please refer to the Current Medication list given to you today.  *If you need a refill on your cardiac medications before your next appointment, please call your pharmacy*   Lab Work: Today: Magnesium If you have labs (blood work) drawn today and your tests are completely normal, you will receive your results only by: Marland Kitchen MyChart Message (if you have MyChart) OR . A paper copy in the mail If you have any lab test that is abnormal or we need to change your treatment, we will call you to review the results.   Testing/Procedures: None ordered   Follow-Up: At [facility], you and your health needs are our priority.  As part of our continuing mission to provide you with exceptional heart care, we have created designated Provider Care Teams.  These Care Teams include your primary Cardiologist (physician) and Advanced Practice Providers (APPs -  Physician Assistants and Nurse Practitioners) who all work together to provide you with the care you need, when you need it.    Remote monitoring is used to monitor your Pacemaker or ICD from home. This monitoring reduces the number of office visits required to check your device to one time per year. It allows [place] to keep an eye on the functioning of your device to ensure it is working properly. You are scheduled for a device check from home on 03/21/2019. You may send your transmission at any time that day. If you have a wireless device, the transmission will be sent automatically. After your physician reviews your transmission, you will receive a postcard with your next transmission date.  Your next appointment:   1 year(s)  The format for your next appointment:   In Person  Provider:   Loman Brooklyn, MD   Thank you for choosing [facility]!!   Dory Horn, RN [phone]

## 2020-02-24 LAB — MAGNESIUM: Magnesium: 2 mg/dL (ref 1.6–2.3)

## 2020-02-27 ENCOUNTER — Ambulatory Visit (HOSPITAL_COMMUNITY): Payer: BC Managed Care – PPO

## 2020-02-27 ENCOUNTER — Inpatient Hospital Stay (HOSPITAL_COMMUNITY): Payer: BC Managed Care – PPO | Attending: Hematology and Oncology

## 2020-02-27 ENCOUNTER — Other Ambulatory Visit: Payer: Self-pay

## 2020-02-27 ENCOUNTER — Encounter (HOSPITAL_COMMUNITY): Payer: Self-pay

## 2020-02-27 VITALS — BP 93/55 | HR 66 | Temp 97.0°F | Resp 17

## 2020-02-27 DIAGNOSIS — D509 Iron deficiency anemia, unspecified: Secondary | ICD-10-CM | POA: Insufficient documentation

## 2020-02-27 DIAGNOSIS — D500 Iron deficiency anemia secondary to blood loss (chronic): Secondary | ICD-10-CM

## 2020-02-27 MED ORDER — SODIUM CHLORIDE 0.9 % IV SOLN: Freq: Once | INTRAVENOUS | Status: AC

## 2020-02-27 MED ORDER — LORATADINE 10 MG PO TABS
10.0000 mg | ORAL_TABLET | Freq: Once | ORAL | Status: AC
  Administered 2020-02-27: 10 mg via ORAL
  Filled 2020-02-27: qty 1

## 2020-02-27 MED ORDER — SODIUM CHLORIDE 0.9 % IV SOLN
200.0000 mg | Freq: Once | INTRAVENOUS | Status: AC
  Administered 2020-02-27: 200 mg via INTRAVENOUS
  Filled 2020-02-27: qty 10

## 2020-02-27 MED ORDER — ACETAMINOPHEN 325 MG PO TABS
650.0000 mg | ORAL_TABLET | Freq: Once | ORAL | Status: AC
  Administered 2020-02-27: 650 mg via ORAL
  Filled 2020-02-27: qty 2

## 2020-02-27 MED ORDER — FAMOTIDINE 20 MG PO TABS
20.0000 mg | ORAL_TABLET | Freq: Once | ORAL | Status: AC
  Administered 2020-02-27: 20 mg via ORAL
  Filled 2020-02-27: qty 1

## 2020-02-27 NOTE — Progress Notes (Signed)
Tolerated iron infusion well today without incidence.  Discharged in stable condition ambulatory. Vital signs stable prior to discharge.  

## 2020-02-27 NOTE — Patient Instructions (Signed)
[facility] Discharge Instructions for Patients Receiving Chemotherapy  Today you received the following chemotherapy agents   To help prevent nausea and vomiting after your treatment, we encourage you to take your nausea medication   If you develop nausea and vomiting that is not controlled by your nausea medication, call the clinic.   BELOW ARE SYMPTOMS THAT SHOULD BE REPORTED IMMEDIATELY:  *FEVER GREATER THAN 100.5 F  *CHILLS WITH OR WITHOUT FEVER  NAUSEA AND VOMITING THAT IS NOT CONTROLLED WITH YOUR NAUSEA MEDICATION  *UNUSUAL SHORTNESS OF BREATH  *UNUSUAL BRUISING OR BLEEDING  TENDERNESS IN MOUTH AND THROAT WITH OR WITHOUT PRESENCE OF ULCERS  *URINARY PROBLEMS  *BOWEL PROBLEMS  UNUSUAL RASH Items with * indicate a potential emergency and should be followed up as soon as possible.  Feel free to call the clinic should you have any questions or concerns. The clinic phone number is [phone].  Please show the CHEMO ALERT CARD at check-in to the Emergency Department and triage nurse.   

## 2020-02-28 ENCOUNTER — Ambulatory Visit (HOSPITAL_COMMUNITY): Payer: BC Managed Care – PPO | Admitting: Oncology

## 2020-02-29 ENCOUNTER — Inpatient Hospital Stay (HOSPITAL_COMMUNITY): Payer: BC Managed Care – PPO

## 2020-02-29 ENCOUNTER — Other Ambulatory Visit: Payer: Self-pay

## 2020-02-29 VITALS — BP 97/51 | HR 62 | Temp 97.4°F | Resp 18

## 2020-02-29 DIAGNOSIS — D509 Iron deficiency anemia, unspecified: Secondary | ICD-10-CM | POA: Diagnosis not present

## 2020-02-29 DIAGNOSIS — D500 Iron deficiency anemia secondary to blood loss (chronic): Secondary | ICD-10-CM

## 2020-02-29 MED ORDER — ACETAMINOPHEN 325 MG PO TABS
ORAL_TABLET | ORAL | Status: AC
  Filled 2020-02-29: qty 2

## 2020-02-29 MED ORDER — FAMOTIDINE 20 MG PO TABS
ORAL_TABLET | ORAL | Status: AC
  Filled 2020-02-29: qty 1

## 2020-02-29 MED ORDER — LORATADINE 10 MG PO TABS
10.0000 mg | ORAL_TABLET | Freq: Once | ORAL | Status: AC
  Administered 2020-02-29: 10 mg via ORAL

## 2020-02-29 MED ORDER — ACETAMINOPHEN 325 MG PO TABS
650.0000 mg | ORAL_TABLET | Freq: Once | ORAL | Status: AC
Start: 2020-02-29 — End: 2020-02-29
  Administered 2020-02-29: 650 mg via ORAL

## 2020-02-29 MED ORDER — SODIUM CHLORIDE 0.9 % IV SOLN: Freq: Once | INTRAVENOUS | Status: AC

## 2020-02-29 MED ORDER — LORATADINE 10 MG PO TABS
ORAL_TABLET | ORAL | Status: AC
  Filled 2020-02-29: qty 1

## 2020-02-29 MED ORDER — FAMOTIDINE 20 MG PO TABS
20.0000 mg | ORAL_TABLET | Freq: Once | ORAL | Status: AC
  Administered 2020-02-29: 20 mg via ORAL

## 2020-02-29 MED ORDER — SODIUM CHLORIDE 0.9 % IV SOLN
200.0000 mg | Freq: Once | INTRAVENOUS | Status: AC
  Administered 2020-02-29: 200 mg via INTRAVENOUS
  Filled 2020-02-29: qty 200

## 2020-02-29 NOTE — Patient Instructions (Signed)
[facility] Discharge Instructions for Patients Receiving Chemotherapy  Today you received the following chemotherapy agents   To help prevent nausea and vomiting after your treatment, we encourage you to take your nausea medication   If you develop nausea and vomiting that is not controlled by your nausea medication, call the clinic.   BELOW ARE SYMPTOMS THAT SHOULD BE REPORTED IMMEDIATELY:  *FEVER GREATER THAN 100.5 F  *CHILLS WITH OR WITHOUT FEVER  NAUSEA AND VOMITING THAT IS NOT CONTROLLED WITH YOUR NAUSEA MEDICATION  *UNUSUAL SHORTNESS OF BREATH  *UNUSUAL BRUISING OR BLEEDING  TENDERNESS IN MOUTH AND THROAT WITH OR WITHOUT PRESENCE OF ULCERS  *URINARY PROBLEMS  *BOWEL PROBLEMS  UNUSUAL RASH Items with * indicate a potential emergency and should be followed up as soon as possible.  Feel free to call the clinic should you have any questions or concerns. The clinic phone number is [phone].  Please show the CHEMO ALERT CARD at check-in to the Emergency Department and triage nurse.   

## 2020-02-29 NOTE — Progress Notes (Signed)
Patient presents today for Venofer.  Vital signs within parameters for infusion.  Patient has no new complaints since last infusion.  Venofer infusion given today per MD orders.  Tolerated infusion without adverse affects.  Vital signs stable.  No complaints at this time.  Discharge from clinic ambulatory in stable condition.  Alert and oriented X 3.  Follow up with [facility] as scheduled.

## 2020-03-02 ENCOUNTER — Inpatient Hospital Stay (HOSPITAL_COMMUNITY): Payer: BC Managed Care – PPO

## 2020-03-02 ENCOUNTER — Other Ambulatory Visit: Payer: Self-pay

## 2020-03-02 VITALS — BP 96/49 | HR 71 | Temp 97.3°F | Resp 18

## 2020-03-02 DIAGNOSIS — D509 Iron deficiency anemia, unspecified: Secondary | ICD-10-CM | POA: Diagnosis not present

## 2020-03-02 DIAGNOSIS — D500 Iron deficiency anemia secondary to blood loss (chronic): Secondary | ICD-10-CM

## 2020-03-02 MED ORDER — SODIUM CHLORIDE 0.9 % IV SOLN: Freq: Once | INTRAVENOUS | Status: AC

## 2020-03-02 MED ORDER — FAMOTIDINE 20 MG PO TABS
20.0000 mg | ORAL_TABLET | Freq: Once | ORAL | Status: AC
  Administered 2020-03-02: 20 mg via ORAL
  Filled 2020-03-02: qty 1

## 2020-03-02 MED ORDER — ACETAMINOPHEN 325 MG PO TABS
650.0000 mg | ORAL_TABLET | Freq: Once | ORAL | Status: AC
  Administered 2020-03-02: 650 mg via ORAL
  Filled 2020-03-02: qty 2

## 2020-03-02 MED ORDER — SODIUM CHLORIDE 0.9 % IV SOLN
200.0000 mg | Freq: Once | INTRAVENOUS | Status: AC
  Administered 2020-03-02: 200 mg via INTRAVENOUS
  Filled 2020-03-02: qty 200

## 2020-03-02 MED ORDER — LORATADINE 10 MG PO TABS
10.0000 mg | ORAL_TABLET | Freq: Once | ORAL | Status: AC
  Administered 2020-03-02: 10 mg via ORAL
  Filled 2020-03-02: qty 1

## 2020-03-02 NOTE — Progress Notes (Signed)
Patient presents today for Venofer infusion.  Vitals signs WNL.  Patient has no new complaints since last visit.  Venofer infusion given today per MD orders.  Tolerated infusion without adverse affects.  Vital signs stable.  No complaints at this time.  Discharge from clinic ambulatory in stable condition.  Alert and oriented X 3.  Follow up with [facility] as scheduled.

## 2020-03-02 NOTE — Patient Instructions (Signed)
[facility] Discharge Instructions for Patients Receiving Chemotherapy  Today you received the following chemotherapy agents   To help prevent nausea and vomiting after your treatment, we encourage you to take your nausea medication   If you develop nausea and vomiting that is not controlled by your nausea medication, call the clinic.   BELOW ARE SYMPTOMS THAT SHOULD BE REPORTED IMMEDIATELY:  *FEVER GREATER THAN 100.5 F  *CHILLS WITH OR WITHOUT FEVER  NAUSEA AND VOMITING THAT IS NOT CONTROLLED WITH YOUR NAUSEA MEDICATION  *UNUSUAL SHORTNESS OF BREATH  *UNUSUAL BRUISING OR BLEEDING  TENDERNESS IN MOUTH AND THROAT WITH OR WITHOUT PRESENCE OF ULCERS  *URINARY PROBLEMS  *BOWEL PROBLEMS  UNUSUAL RASH Items with * indicate a potential emergency and should be followed up as soon as possible.  Feel free to call the clinic should you have any questions or concerns. The clinic phone number is [phone].  Please show the CHEMO ALERT CARD at check-in to the Emergency Department and triage nurse.   

## 2020-03-05 ENCOUNTER — Other Ambulatory Visit: Payer: Self-pay

## 2020-03-05 ENCOUNTER — Inpatient Hospital Stay (HOSPITAL_COMMUNITY): Payer: BC Managed Care – PPO

## 2020-03-05 VITALS — BP 91/54 | HR 66 | Temp 97.1°F | Resp 16

## 2020-03-05 DIAGNOSIS — D509 Iron deficiency anemia, unspecified: Secondary | ICD-10-CM | POA: Diagnosis not present

## 2020-03-05 DIAGNOSIS — D500 Iron deficiency anemia secondary to blood loss (chronic): Secondary | ICD-10-CM

## 2020-03-05 MED ORDER — FAMOTIDINE 20 MG PO TABS
20.0000 mg | ORAL_TABLET | Freq: Once | ORAL | Status: AC
  Administered 2020-03-05: 20 mg via ORAL
  Filled 2020-03-05: qty 1

## 2020-03-05 MED ORDER — LORATADINE 10 MG PO TABS
10.0000 mg | ORAL_TABLET | Freq: Once | ORAL | Status: AC
  Administered 2020-03-05: 10 mg via ORAL
  Filled 2020-03-05: qty 1

## 2020-03-05 MED ORDER — ACETAMINOPHEN 325 MG PO TABS
650.0000 mg | ORAL_TABLET | Freq: Once | ORAL | Status: AC
  Administered 2020-03-05: 650 mg via ORAL
  Filled 2020-03-05: qty 2

## 2020-03-05 MED ORDER — SODIUM CHLORIDE 0.9 % IV SOLN
200.0000 mg | Freq: Once | INTRAVENOUS | Status: AC
  Administered 2020-03-05: 200 mg via INTRAVENOUS
  Filled 2020-03-05: qty 200

## 2020-03-05 MED ORDER — SODIUM CHLORIDE 0.9 % IV SOLN: Freq: Once | INTRAVENOUS | Status: AC

## 2020-03-05 NOTE — Progress Notes (Signed)
Patient presents today for Venofer infusion.  Vital signs stable.  No new complaints since last visit.    Venofer infusion given today per MD orders.  Tolerated infusion without adverse affects.  Vital signs stable.  No complaints at this time.  Discharge from clinic ambulatory in stable condition.  Alert and oriented X 3.  Follow up with [facility] as scheduled.

## 2020-03-05 NOTE — Patient Instructions (Signed)
[facility] Discharge Instructions for Patients Receiving Chemotherapy  Today you received the following chemotherapy agents   To help prevent nausea and vomiting after your treatment, we encourage you to take your nausea medication   If you develop nausea and vomiting that is not controlled by your nausea medication, call the clinic.   BELOW ARE SYMPTOMS THAT SHOULD BE REPORTED IMMEDIATELY:  *FEVER GREATER THAN 100.5 F  *CHILLS WITH OR WITHOUT FEVER  NAUSEA AND VOMITING THAT IS NOT CONTROLLED WITH YOUR NAUSEA MEDICATION  *UNUSUAL SHORTNESS OF BREATH  *UNUSUAL BRUISING OR BLEEDING  TENDERNESS IN MOUTH AND THROAT WITH OR WITHOUT PRESENCE OF ULCERS  *URINARY PROBLEMS  *BOWEL PROBLEMS  UNUSUAL RASH Items with * indicate a potential emergency and should be followed up as soon as possible.  Feel free to call the clinic should you have any questions or concerns. The clinic phone number is [phone].  Please show the CHEMO ALERT CARD at check-in to the Emergency Department and triage nurse.   

## 2020-03-08 ENCOUNTER — Ambulatory Visit: Payer: BC Managed Care – PPO | Admitting: Cardiovascular Disease

## 2020-03-08 ENCOUNTER — Encounter: Payer: Self-pay | Admitting: Cardiovascular Disease

## 2020-03-08 ENCOUNTER — Other Ambulatory Visit: Payer: Self-pay

## 2020-03-08 VITALS — BP 96/72 | HR 57 | Ht 69.0 in | Wt 146.6 lb

## 2020-03-08 DIAGNOSIS — I340 Nonrheumatic mitral (valve) insufficiency: Secondary | ICD-10-CM

## 2020-03-08 DIAGNOSIS — I493 Ventricular premature depolarization: Secondary | ICD-10-CM

## 2020-03-08 DIAGNOSIS — I5022 Chronic systolic (congestive) heart failure: Secondary | ICD-10-CM | POA: Diagnosis not present

## 2020-03-08 DIAGNOSIS — I2510 Atherosclerotic heart disease of native coronary artery without angina pectoris: Secondary | ICD-10-CM

## 2020-03-08 DIAGNOSIS — I480 Paroxysmal atrial fibrillation: Secondary | ICD-10-CM | POA: Diagnosis not present

## 2020-03-08 DIAGNOSIS — I255 Ischemic cardiomyopathy: Secondary | ICD-10-CM

## 2020-03-08 NOTE — Patient Instructions (Signed)
Medication Instructions:  Your physician recommends that you continue on your current medications as directed. Please refer to the Current Medication list given to you today.  *If you need a refill on your cardiac medications before your next appointment, please call your pharmacy*   Lab Work: none If you have labs (blood work) drawn today and your tests are completely normal, you will receive your results only by: . MyChart Message (if you have MyChart) OR . A paper copy in the mail If you have any lab test that is abnormal or we need to change your treatment, we will call you to review the results.   Testing/Procedures: none   Follow-Up: At [facility], you and your health needs are our priority.  As part of our continuing mission to provide you with exceptional heart care, we have created designated Provider Care Teams.  These Care Teams include your primary Cardiologist (physician) and Advanced Practice Providers (APPs -  Physician Assistants and Nurse Practitioners) who all work together to provide you with the care you need, when you need it.  We recommend signing up for the patient portal called "MyChart".  Sign up information is provided on this After Visit Summary.  MyChart is used to connect with patients for Virtual Visits (Telemedicine).  Patients are able to view lab/test results, encounter notes, upcoming appointments, etc.  Non-urgent messages can be sent to your provider as well.   To learn more about what you can do with MyChart, go to [email or web address]://www.mychart.com.    Your next appointment:   12 month(s)  The format for your next appointment:   In Person  Provider:   You may see Christopher McAlhany, MD or one of the following Advanced Practice Providers on your designated Care Team:    Dayna Dunn, PA-C  Michele Lenze, PA-C    Other Instructions  

## 2020-03-08 NOTE — Progress Notes (Signed)
Chief Complaint  Patient presents with  . Follow-up    CAD    History of Present Illness: 55 yo female with history of CAD with anterior STEMI in July 2017, ischemic cardiomyopathy s/p ICD, chronic systolic CHF, paroxysmal atrial fib, severe mitral regurgitation here today for follow up. She presented to the [facility] ED 09/19/15 with chest pain and workup was delayed leading to late recognition of her MI. Cardiac cath 08/2715 with occluded mid LAD treated with a single drug eluting stent. She was found to have severe LV systolic dysfunction with LVEF around 30% immediately post cath. Continued chest pain and relook cath 09/21/15 demonstrated patency of the LAD stent. Her hospitalization was complicated by atrial fibrillation. She has been on Xarelto. She did not tolerate amiodarone. Echo November 2017 with LVEF 30-35%, severe MR. ICD implanted November 2017. She has been followed in the advanced CHF clinic by Dr. Shirlee Latch.TEE August 2018 showed LVEF=30% with moderate MR. She was found to have frequent PVCs on her device and Holter December 2018 showed 41% PVCs. She was started on Sotalol and Coreg was stopped. She had a PVC ablation in February 2019 but the PVCs returned. Echo August 2019 with LVEF=30% with normal RV size and function, moderate MR. She was admitted to [facility] in October 2019 with chest pain. Cardiac cath with patent LAD stent and no obstructive disease in the other vessels. She has had iron deficiency anemia with negative. GI workup has been completed with no clear source of bleeding. She is getting iron infusions. Echo December 2020 with LVEF=30%, anterior wall motion abnormality and moderate MR. She is followed in the EP clinic by Dr. Raul Del and in the Advanced Heart Failure Clinic by Dr. Shirlee Latch.   She is here today for follow up. The patient denies any chest pain, dyspnea, palpitations, lower extremity edema, orthopnea, PND, dizziness, near syncope or syncope.   Primary Care  Physician: Benita Stabile, MD   Past Medical History:  Diagnosis Date  . AICD (automatic cardioverter/defibrillator) present    [employer]  . CAD in native artery    a. late recognition of presentation of STEMI 08/2015 s/p DES to LAD, mild residual mRCA.  Marland Kitchen Chronic systolic CHF (congestive heart failure) (HCC)   . CKD (chronic kidney disease), stage III (HCC)   . Hypotension    a. preventing med titration for HF.  Marland Kitchen Hypothyroidism 09/24/2015  . Ischemic cardiomyopathy   . Myocardial infarction (HCC) 08/2015  . PAF (paroxysmal atrial fibrillation) (HCC) 09/24/2015   a. dx at time of STEMI 08/2015.  . Pre-diabetes   . Presence of permanent cardiac pacemaker    patient has ICD  . PVC's (premature ventricular contractions)     Past Surgical History:  Procedure Laterality Date  . BIOPSY  06/16/2019   Procedure: BIOPSY;  Surgeon: Corbin Ade, MD;  Location: [facility] ENDO SUITE;  Service: Endoscopy;;  . CARDIAC CATHETERIZATION N/A 09/20/2015   Procedure: Left Heart Cath and Coronary Angiography;  Surgeon: Kathleene Hazel, MD;  Location: [facility] INVASIVE CV LAB;  Service: Cardiovascular;  Laterality: N/A;  . CARDIAC CATHETERIZATION N/A 09/20/2015   Procedure: Coronary Stent Intervention;  Surgeon: Kathleene Hazel, MD;  Location: [facility] INVASIVE CV LAB;  Service: Cardiovascular;  Laterality: N/A;  . CARDIAC CATHETERIZATION N/A 09/21/2015   Procedure: Left Heart Cath and Coronary Angiography;  Surgeon: Tonny Bollman, MD;  Location: [facility] INVASIVE CV LAB;  Service: Cardiovascular;  Laterality: N/A;  . CARDIAC CATHETERIZATION N/A 11/26/2015  Procedure: Right Heart Cath;  Surgeon: [name], MD;  Location: [facility] INVASIVE CV LAB;  Service: Cardiovascular;  Laterality: N/A;  . COLONOSCOPY WITH PROPOFOL N/A 11/23/2017   Dr. Jena Gauss: Diverticulosis, internal hemorrhoids, next colonoscopy in 10 years  . COLONOSCOPY WITH PROPOFOL N/A 08/10/2019   Procedure: COLONOSCOPY WITH PROPOFOL;  Surgeon: Corbin Ade, MD;  Diverticulosis in sigmoid colon, nonbleeding internal hemorrhoids, otherwise normal exam.    . CORONARY STENT PLACEMENT  09/20/2015  . EP IMPLANTABLE DEVICE N/A 01/23/2016   Procedure: ICD Implant;  Surgeon: Will Jorja Loa, MD;  Location: [facility] INVASIVE CV LAB;  Service: Cardiovascular;  Laterality: N/A;  . ESOPHAGOGASTRODUODENOSCOPY (EGD) WITH PROPOFOL N/A 06/16/2019   Procedure: ESOPHAGOGASTRODUODENOSCOPY (EGD) WITH PROPOFOL;  Surgeon: Corbin Ade, MD;  Normal esophagus, small hiatal hernia, normal examined stomach, normal examined duodenum.  Gastric biopsy with slight chronic inflammation, duodenal biopsy with slight intramucosal Brunner gland hyperplasia.  Marland Kitchen GIVENS CAPSULE STUDY N/A 11/23/2019   Procedure: GIVENS CAPSULE STUDY;  Surgeon: Corbin Ade, MD;  Location: [facility];  Service: Endoscopy;  Laterality: N/A;  7:30am  . LEFT HEART CATH AND CORONARY ANGIOGRAPHY N/A 12/18/2017   Procedure: LEFT HEART CATH AND CORONARY ANGIOGRAPHY;  Surgeon: [name], MD;  Location: [facility] INVASIVE CV LAB;  Service: Cardiovascular;  Laterality: N/A;  . PVC ABLATION N/A 04/02/2017   Procedure: PVC ABLATION;  Surgeon: Regan Lemming, MD;  Location: [facility] INVASIVE CV LAB;  Service: Cardiovascular;  Laterality: N/A;  . TEE WITHOUT CARDIOVERSION N/A 10/08/2016   Procedure: TRANSESOPHAGEAL ECHOCARDIOGRAM (TEE);  Surgeon: [name], MD;  Location: [facility] ENDOSCOPY;  Service: Cardiovascular;  Laterality: N/A;  . THYROIDECTOMY      Home Meds:  Current Meds  Medication Sig  . buPROPion (WELLBUTRIN XL) 300 MG 24 hr tablet Take 300 mg by mouth daily.  . Calcium Carb-Cholecalciferol (CALCIUM + D3 PO) Take 1 tablet by mouth every morning.  . Coenzyme Q10 (COQ10) 200 MG CAPS Take 200 mg by mouth daily.  . fexofenadine (ALLEGRA) 180 MG tablet Take 180 mg by mouth daily.  . furosemide (LASIX) 20 MG tablet TAKE 1 TABLET(20 MG) BY MOUTH DAILY AS NEEDED  . losartan (COZAAR) 25 MG tablet Take 0.5  tablets (12.5 mg total) by mouth at bedtime.  . nitroGLYCERIN (NITROSTAT) 0.4 MG SL tablet Place 1 tablet (0.4 mg total) under the tongue every 5 (five) minutes as needed for chest pain.  . pantoprazole (PROTONIX) 40 MG tablet TAKE 1 TABLET(40 MG) BY MOUTH TWICE DAILY BEFORE A MEAL  . potassium chloride SA (KLOR-CON) 20 MEQ tablet Take 40 mEq by mouth every other day.   . rosuvastatin (CRESTOR) 10 MG tablet Take 1 tablet (10 mg total) by mouth daily.  . sotalol (BETAPACE) 160 MG tablet Take 1 tablet (160 mg total) by mouth in the morning AND 0.5 tablets (80 mg total) every evening.  Marland Kitchen spironolactone (ALDACTONE) 25 MG tablet Take 1 tablet (25 mg total) by mouth daily.  Marland Kitchen thyroid (ARMOUR) 120 MG tablet Take 120 mg by mouth daily before breakfast.  . traZODone (DESYREL) 50 MG tablet Take 0.5 tablets (25 mg total) by mouth at bedtime as needed for sleep.  Marland Kitchen triamcinolone (NASACORT) 55 MCG/ACT AERO nasal inhaler Place 2 sprays into the nose daily.   Carlena Hurl 20 MG TABS tablet TAKE 1 TABLET BY MOUTH EVERY DAY WITH SUPPER    Allergies  Allergen Reactions  . Paxil [Paroxetine] Palpitations  . Morphine And Related  Nausea And Vomiting  . Percocet [Oxycodone-Acetaminophen] Nausea And Vomiting  . Cefdinir Nausea Only  . Zolpidem Other (See Comments)    Doesn't work for patient at all    Social History   Socioeconomic History  . Marital status: Married    Spouse name: Not on file  . Number of children: Not on file  . Years of education: Not on file  . Highest education level: Not on file  Occupational History  . Not on file  Tobacco Use  . Smoking status: Never Smoker  . Smokeless tobacco: Never Used  Vaping Use  . Vaping Use: Never used  Substance and Sexual Activity  . Alcohol use: No  . Drug use: No  . Sexual activity: Not on file  Other Topics Concern  . Not on file  Social History Narrative  . Not on file   Social Determinants of Health   Financial Resource Strain: Low Risk    . Difficulty of Paying Living Expenses: Not hard at all  Food Insecurity: No Food Insecurity  . Worried About Programme researcher, broadcasting/film/video in the Last Year: Never true  . Ran Out of Food in the Last Year: Never true  Transportation Needs: No Transportation Needs  . Lack of Transportation (Medical): No  . Lack of Transportation (Non-Medical): No  Physical Activity: Insufficiently Active  . Days of Exercise per Week: 3 days  . Minutes of Exercise per Session: 30 min  Stress: No Stress Concern Present  . Feeling of Stress : Not at all  Social Connections: Socially Integrated  . Frequency of Communication with Friends and Family: More than three times a week  . Frequency of Social Gatherings with Friends and Family: More than three times a week  . Attends Religious Services: More than 4 times per year  . Active Member of Clubs or Organizations: Yes  . Attends Banker Meetings: More than 4 times per year  . Marital Status: Married  Catering manager Violence: Not At Risk  . Fear of Current or Ex-Partner: No  . Emotionally Abused: No  . Physically Abused: No  . Sexually Abused: No    Family History  Problem Relation Age of Onset  . Heart attack Father   . Arrhythmia Mother   . Lung cancer Mother        bronchiectasis  . Colon cancer Neg Hx   . Colon polyps Neg Hx     Review of Systems:  As stated in the HPI and otherwise negative.   BP 96/72   Pulse (!) 57   Ht 5\' 9"  (1.753 m)   Wt 146 lb 9.6 oz (66.5 kg)   SpO2 97%   BMI 21.65 kg/m   Physical Examination:  General: Well developed, well nourished, NAD  HEENT: OP clear, mucus membranes moist  SKIN: warm, dry. No rashes. Neuro: No focal deficits  Musculoskeletal: Muscle strength 5/5 all ext  Psychiatric: Mood and affect normal  Neck: No JVD, no carotid bruits, no thyromegaly, no lymphadenopathy.  Lungs:Clear bilaterally, no wheezes, rhonci, crackles Cardiovascular: Regular rate and rhythm. Soft systolic murmur.   Abdomen:Soft. Bowel sounds present. Non-tender.  Extremities: No lower extremity edema. Pulses are 2 + in the bilateral DP/PT.  Echo December 2020: 1. Left ventricular ejection fraction, by visual estimation, is 30%. The  left ventricle has moderate to severely decreased function. There is no  left ventricular hypertrophy.  2. Left ventricular diastolic parameters are consistent with Grade II  diastolic dysfunction (pseudonormalization).  3. Moderately dilated left ventricular internal cavity size.  4. The left ventricle demonstrates regional wall motion abnormalities.  Akinesis of the anteroseptal and inferoseptal walls, akinesis of the true  apex, akinesis of the apical lateral and apical anterior walls.  5. Global right ventricle has normal systolic function.The right  ventricular size is normal. No increase in right ventricular wall  thickness.  6. A pacer wire is visualized in the RV.  7. Left atrial size was mildly dilated.  8. Right atrial size was normal.  9. The mitral valve is normal in structure. Moderate mitral valve  regurgitation, suspect functional MR. No evidence of mitral stenosis.  10. The tricuspid valve is normal in structure.  11. The aortic valve is tricuspid. Aortic valve regurgitation is not  visualized. No evidence of aortic valve sclerosis or stenosis.  12. The tricuspid regurgitant velocity is 2.84 m/s, and with an assumed  right atrial pressure of 3 mmHg, the estimated right ventricular systolic  pressure is mildly elevated at 35.3 mmHg.  13. The inferior vena cava is normal in size with greater than 50%  respiratory variability, suggesting right atrial pressure of 3 mmHg.   EKG:  EKG is not ordered today. The ekg ordered today demonstrates   Recent Labs: 08/01/2019: ALT 14 12/21/2019: BUN 24; Creatinine, Ser 1.38; Potassium 4.5; Sodium 138 02/10/2020: Hemoglobin 10.1; Platelets 198; TSH 0.415 02/23/2020: Magnesium 2.0   Lipid Panel     Component Value Date/Time   CHOL 124 12/21/2019 0947   TRIG 105 12/21/2019 0947   HDL 50 12/21/2019 0947   CHOLHDL 2.5 12/21/2019 0947   VLDL 21 12/21/2019 0947   LDLCALC 53 12/21/2019 0947     Wt Readings from Last 3 Encounters:  03/08/20 146 lb 9.6 oz (66.5 kg)  02/23/20 148 lb (67.1 kg)  02/10/20 149 lb 3.2 oz (67.7 kg)     Assessment and Plan:    1. CAD without angina: She is known to have CAD with anterior MI in July 2017 with placement of a single DES in the mid LAD. She had prolonged ischemic time and now has LV dysfunction. Cardiac cath October 2019 with patent LAD stent and no disease in the other vessels. She has no chest pain. Will continue statin. She is on sotalol for PVCs and Xarelto for PAF. No ASA since she is on Xarelto.   2. PAF/PVCs: She is in sinus today. Continue Sotalol and Xarelto. She is followed by Dr. Raul Del in the EP clinic. Of note, she did not tolerate amiodarone.    3. Chronic systolic CHF/Ischemic cardiomyopathy: Weight stable, volume status is normal. LVEF=30% by echo December 2020. ICD in place. Continue current therapy. She is followed in the advanced heart failure clinic by Dr. Shirlee Latch  4. Mitral regurgitation: Moderate by echo December 2020  Follow up with me in one year  Signed, Verne Carrow, MD 03/08/2020 12:06 PM    [facility] [address] [place], [place], [place]  [address] Phone: [phone]; Fax: [phone]

## 2020-03-09 ENCOUNTER — Inpatient Hospital Stay (HOSPITAL_COMMUNITY): Payer: BC Managed Care – PPO

## 2020-03-09 VITALS — BP 90/54 | HR 50 | Temp 96.8°F | Resp 16

## 2020-03-09 DIAGNOSIS — D509 Iron deficiency anemia, unspecified: Secondary | ICD-10-CM | POA: Diagnosis not present

## 2020-03-09 DIAGNOSIS — D500 Iron deficiency anemia secondary to blood loss (chronic): Secondary | ICD-10-CM

## 2020-03-09 MED ORDER — ACETAMINOPHEN 325 MG PO TABS
650.0000 mg | ORAL_TABLET | Freq: Once | ORAL | Status: AC
  Administered 2020-03-09: 650 mg via ORAL
  Filled 2020-03-09: qty 2

## 2020-03-09 MED ORDER — LORATADINE 10 MG PO TABS
10.0000 mg | ORAL_TABLET | Freq: Once | ORAL | Status: AC
  Administered 2020-03-09: 10 mg via ORAL
  Filled 2020-03-09: qty 1

## 2020-03-09 MED ORDER — SODIUM CHLORIDE 0.9 % IV SOLN
200.0000 mg | Freq: Once | INTRAVENOUS | Status: AC
  Administered 2020-03-09: 200 mg via INTRAVENOUS
  Filled 2020-03-09: qty 200

## 2020-03-09 MED ORDER — FAMOTIDINE 20 MG PO TABS
20.0000 mg | ORAL_TABLET | Freq: Once | ORAL | Status: AC
  Administered 2020-03-09: 20 mg via ORAL
  Filled 2020-03-09: qty 1

## 2020-03-09 MED ORDER — SODIUM CHLORIDE 0.9 % IV SOLN: Freq: Once | INTRAVENOUS | Status: AC

## 2020-03-09 NOTE — Progress Notes (Signed)
Catherine Mays arrived today for Venofer per Dr. Marice Potter orders. She tolerated infusion well without incidence. Catherine Mays discharged ambulatory from clinic and in stable condition. Follow up as instructed.

## 2020-03-18 ENCOUNTER — Other Ambulatory Visit (HOSPITAL_COMMUNITY): Payer: Self-pay | Admitting: Cardiology

## 2020-03-20 ENCOUNTER — Ambulatory Visit (INDEPENDENT_AMBULATORY_CARE_PROVIDER_SITE_OTHER): Payer: BC Managed Care – PPO

## 2020-03-20 DIAGNOSIS — I480 Paroxysmal atrial fibrillation: Secondary | ICD-10-CM | POA: Diagnosis not present

## 2020-03-20 LAB — CUP PACEART REMOTE DEVICE CHECK
Battery Remaining Longevity: 67 mo
Battery Remaining Percentage: 65 %
Battery Voltage: 2.96 V
Brady Statistic RV Percent Paced: 1 %
Date Time Interrogation Session: 20220125020019
HighPow Impedance: 71 Ohm
HighPow Impedance: 71 Ohm
Implantable Lead Implant Date: 20171129
Implantable Lead Location: 753860
Implantable Pulse Generator Implant Date: 20171129
Lead Channel Impedance Value: 340 Ohm
Lead Channel Pacing Threshold Amplitude: 1.5 V
Lead Channel Pacing Threshold Pulse Width: 0.5 ms
Lead Channel Sensing Intrinsic Amplitude: 12 mV
Lead Channel Setting Pacing Amplitude: 2.5 V
Lead Channel Setting Pacing Pulse Width: 0.5 ms
Lead Channel Setting Sensing Sensitivity: 0.5 mV
Pulse Gen Serial Number: 7377983

## 2020-03-23 ENCOUNTER — Other Ambulatory Visit: Payer: Self-pay

## 2020-03-23 ENCOUNTER — Encounter (HOSPITAL_COMMUNITY): Payer: Self-pay | Admitting: Cardiology

## 2020-03-23 ENCOUNTER — Ambulatory Visit (HOSPITAL_BASED_OUTPATIENT_CLINIC_OR_DEPARTMENT_OTHER)
Admission: RE | Admit: 2020-03-23 | Discharge: 2020-03-23 | Disposition: A | Discharge status: Home / Self Care | Payer: BC Managed Care – PPO | Source: Ambulatory Visit | Attending: Cardiology | Admitting: Cardiology

## 2020-03-23 ENCOUNTER — Ambulatory Visit (HOSPITAL_COMMUNITY)
Admission: RE | Admit: 2020-03-23 | Discharge: 2020-03-23 | Disposition: A | Discharge status: Home / Self Care | Payer: BC Managed Care – PPO | Source: Ambulatory Visit | Attending: Cardiology | Admitting: Cardiology

## 2020-03-23 VITALS — BP 100/60 | HR 60 | Wt 150.6 lb

## 2020-03-23 DIAGNOSIS — I5022 Chronic systolic (congestive) heart failure: Secondary | ICD-10-CM | POA: Insufficient documentation

## 2020-03-23 DIAGNOSIS — I2510 Atherosclerotic heart disease of native coronary artery without angina pectoris: Secondary | ICD-10-CM

## 2020-03-23 DIAGNOSIS — I340 Nonrheumatic mitral (valve) insufficiency: Secondary | ICD-10-CM | POA: Diagnosis not present

## 2020-03-23 LAB — BASIC METABOLIC PANEL
Anion gap: 6 (ref 5–15)
BUN: 23 mg/dL — ABNORMAL HIGH (ref 6–20)
CO2: 27 mmol/L (ref 22–32)
Calcium: 9.1 mg/dL (ref 8.9–10.3)
Chloride: 107 mmol/L (ref 98–111)
Creatinine, Ser: 0.99 mg/dL (ref 0.44–1.00)
GFR, Estimated: >60 mL/min (ref >60)
Glucose, Bld: 98 mg/dL (ref 70–99)
Potassium: 4.9 mmol/L (ref 3.5–5.1)
Sodium: 140 mmol/L (ref 135–145)

## 2020-03-23 LAB — ECHOCARDIOGRAM COMPLETE
Area-P 1/2: 4.49 cm2
S' Lateral: 4.7 cm
Single Plane A4C EF: 32.2 %

## 2020-03-23 MED ORDER — APIXABAN 5 MG PO TABS: 5.0000 mg | ORAL_TABLET | Freq: Two times a day (BID) | ORAL | 3 refills | Status: DC

## 2020-03-23 NOTE — Patient Instructions (Addendum)
Labs done today. We will contact you only if your labs are abnormal.  DISCONTINUE Xarelto  START Eliquis 5mg  (1 tablet) by mouth 2 times daily.  No other medication changes were made. Please continue all current medications as prescribed.  Your physician recommends that you schedule a follow-up appointment in: 3 months   If you have any questions or concerns before your next appointment please send a message through [facility] or call our office at [phone].    TO LEAVE A MESSAGE FOR THE NURSE SELECT OPTION 2, PLEASE LEAVE A MESSAGE INCLUDING: . YOUR NAME . DATE OF BIRTH . CALL BACK NUMBER . REASON FOR CALL**this is important as we prioritize the call backs  YOU WILL RECEIVE A CALL BACK THE SAME DAY AS LONG AS YOU CALL BEFORE 4:00 PM   Do the following things EVERYDAY: 1) Weigh yourself in the morning before breakfast. Write it down and keep it in a log. 2) Take your medicines as prescribed 3) Eat low salt foods-Limit salt (sodium) to 2000 mg per day.  4) Stay as active as you can everyday 5) Limit all fluids for the day to less than 2 liters   At the [facility] Clinic, you and your health needs are our priority. As part of our continuing mission to provide you with exceptional heart care, we have created designated Provider Care Teams. These Care Teams include your primary Cardiologist (physician) and Advanced Practice Providers (APPs- Physician Assistants and Nurse Practitioners) who all work together to provide you with the care you need, when you need it.   You may see any of the following providers on your designated Care Team at your next follow up: [phone] Dr Marland Kitchen . Dr Arvilla Meres . Marca Ancona, NP . Tonye Becket, PA . Robbie Lis, PharmD   Please be sure to bring in all your medications bottles to every appointment.

## 2020-03-23 NOTE — Progress Notes (Signed)
  Echocardiogram 2D Echocardiogram has been performed.  Catherine Mays 03/23/2020, 9:50 AM

## 2020-03-25 NOTE — Progress Notes (Signed)
PCP: Dr. Margo Aye HF Cardiology: Dr. Shirlee Latch  55 y.o. with history of CAD s/p anterior STEMI with DES to LAD, ischemic cardiomyopathy, mitral regurgitation, and paroxysmal atrial fibrillation presents for followup of CHF and CAD.  She was admitted in 7/17 with anterior STEMI.  She had a late presentation to the cath lab.  Echo in 10/17 showed EF about 25% with LAD-territory wall motion abnormalities.  She had peri-MI atrial fibrillation and was started on Xarelto.  She was put on amiodarone.   After discharge, she continued to feel poorly.  She developed nausea, anorexia, and significant fatigue.  She was admitted for evaluation in 10/17 given concern for low output heart failure.  However, stopping amiodarone essentially resolved her symptoms.  She had RHC showing preserved cardiac output but the PCWP was high due to prominent v-waves, presumably from significant mitral regurgitation.  Of note, she was in atrial fibrillation for a time during this admission.   She had St Jude ICD placed.  She is back at work for the [employer].   TEE in 8/18 to evaluate the mitral valve showed EF 30%, moderate MR (probably functional).    Patient was seen by Dr. Elberta Fortis and noted to have very frequent PVCs on device interrogation.  Holter in 12/18 showed 41% PVCs.  She was started on sotalol 80 mg bid and titrated up to 120 mg bid.  Coreg was stopped with the addition of sotalol and losartan was decreased to 12.5 mg daily due to low BP.  She had PVC ablation in 2/19 but PVCs recurred. Sotalol was increased to 160 mg bid.   Echo 8/19 showed EF 30% with regional wall motion abnormalities, normal RV size and systolic function, moderate MR.   She was admitted in 10/19 with chest pain.  Cath showed patent LAD stent and no obstructive CAD.   I started her on Farxiga but she had to stop it after developing a chronic yeast infection.   She was found to be anemic with Fe deficiency (hgb down to 8).  FOBT+. She has  had IV Fe and blood transfusion.  EGD and colonoscopy this year were fairly unremarkable.  She had diverticulosis.  Capsule endoscopy negative in 9/21.   Echo was done today and reviewed, EF 25-30%, wall motion abnormalities in LAD distribution, mild-moderate MR, normal RV.   She seems to be doing well today.  No significant exertional dyspnea.  Mild lightheadedness if she stands too fast.  Rare palpitations.  She has been taking Lasix 20 mg once daily.  Weight stable.   Labs (10/17): K 3.9, creatinine 1.24, hgb 10.3 Labs (11/17): K 4, creatinine 1.27 Labs (12/17): K 4.2, creatinine 1.12, BNP 439 Labs (2/18): K 4.7, creatinine 1.37 Labs (7/18): K 3.9, creatinine 1.14, hgb 11.7 Labs (12/18): K 4.5, creatinine 1.23 Labs (1/19): K 4.4, creatinine 1.14 Labs (2/19): LDL 97 (off atorvastatin), HDL 56 Labs (5/19): K 4.3, creatinine 1.1 Labs (10/19): K 4.2, creatinine 1.2, LDL 70, hgb 11.8 Labs (11/19): K 4.9, creatinine 1.17, hgb 11.8, LDL 68, low TSH  Labs (9/20): K 4.4, creatinine 1.34, LDL 52 Labs (12/20): K 4.6, creatinine 1.19 Labs (6/21): LDL 73, HDL 62, hgb 11.9, K 4.2, creatinine 1.14 Labs (7/21): hgb 11.2, K 3.8, creatinine 1.12 Labs (10/21): K 4.5, creatinine 1.38, LDL 53, HDL 50  St Jude device interrogation: Normal thoracic impedance.   ECG (personally reviewed): NSR, LAFB, old anterior MI, QTc 415 msec  PMH:  1. CAD: Anterior STEMI in 7/17  with late presentation to the cath lab. She had DES to proximal LAD.  No significant disease in other vessels.  - LHC (10/19): Patent LAD stent, no obstructive disease.  2. Chronic systolic CHF: Ischemic cardiomyopathy.  - Echo 10/17 with EF 25%, akinesis of the mid-apical anteroseptal and anterior walls, apical dyskinesis, moderate to severe MR with incomplete coaptation.  - RHC (10/17): mean RA 4, PA 59/17 mean 38, mean PCWP 27 with prominent V waves suggesting significant MR, CI 3.06, PVR 2 WU.  - ACEI cough.  - Echo (11/17): EF 30-35%,  normal RV size and systolic function, severe MR with restriction of posterior leaflet.   - Echo (8/19): EF 30% with regional wall motion abnormalities, normal RV size and systolic function, moderate MR.  - Echo (12/20): EF 30%, LAD wall motion abnormalities, normal RV, PASP 35, moderate functional MR.  - Echo (1/22): EF 25-30%, wall motion abnormalities in LAD distribution, mild-moderate MR, normal RV.  3. Atrial fibrillation: Paroxysmal.  Noted 7/17 admission peri-MI, also noted again 10/17 admission. She did not tolerate amiodarone due to nausea/anorexia.  4. Mitral regurgitation: Moderate to severe on 10/17 echo, incomplete coaptation.  ?Etiology, her MI (anterior) does not typically lead to ischemic MR. - Echo in 11/17 with severe MR, posterior leaflet restricted.  - TEE (8/18): EF 30%, moderately dilated LV with akinetic septal and anterior walls, moderate functional MR, normal RV size and systolic function.  - Echo (8/19): Moderate MR 5. Hyperlipidemia: Myalgias with atorvastatin.  6. PVCs: Holter 12/18 with 41% PVCs.  - PVC ablation in 2/19 with recurrence of PVCs.  7. GI bleeding:  - EGD (4/21): unremarkable.  - Colonoscopy (7/21): Diverticulosis.  - Capsule endoscopy (9/21): negative.   SH: Married, lives in [place], works for the [employer], no smoking or ETOH.   Family History  Problem Relation Age of Onset  . Heart attack Father   . Arrhythmia Mother   . Lung cancer Mother        bronchiectasis  . Colon cancer Neg Hx   . Colon polyps Neg Hx    ROS: All systems reviewed and negative except as per HPI.   Current Outpatient Medications on File Prior to Encounter  Medication Sig Dispense Refill  . buPROPion (WELLBUTRIN XL) 300 MG 24 hr tablet Take 300 mg by mouth daily.    . Calcium Carb-Cholecalciferol (CALCIUM + D3 PO) Take 1 tablet by mouth every morning.    . Coenzyme Q10 (COQ10) 200 MG CAPS Take 200 mg by mouth daily.    . fexofenadine (ALLEGRA) 180 MG tablet Take 180 mg by  mouth daily.    . furosemide (LASIX) 20 MG tablet TAKE 1 TABLET(20 MG) BY MOUTH DAILY AS NEEDED 30 tablet 5  . losartan (COZAAR) 25 MG tablet Take 0.5 tablets (12.5 mg total) by mouth at bedtime. 15 tablet 3  . nitroGLYCERIN (NITROSTAT) 0.4 MG SL tablet Place 1 tablet (0.4 mg total) under the tongue every 5 (five) minutes as needed for chest pain. 25 tablet 3  . pantoprazole (PROTONIX) 40 MG tablet TAKE 1 TABLET(40 MG) BY MOUTH TWICE DAILY BEFORE A MEAL 180 tablet 1  . potassium chloride SA (KLOR-CON) 20 MEQ tablet Take 40 mEq by mouth every other day.     . rosuvastatin (CRESTOR) 10 MG tablet Take 1 tablet (10 mg total) by mouth daily. 90 tablet 3  . sotalol (BETAPACE) 160 MG tablet TAKE 1 TABLET BY MOUTH EVERY MORNING AND 1/2 TABLET EVERY EVENING 45 tablet  6  . spironolactone (ALDACTONE) 25 MG tablet Take 1 tablet (25 mg total) by mouth daily. 30 tablet 11  . thyroid (ARMOUR) 120 MG tablet Take 120 mg by mouth daily before breakfast.    . traZODone (DESYREL) 50 MG tablet Take 0.5 tablets (25 mg total) by mouth at bedtime as needed for sleep. 30 tablet 0  . triamcinolone (NASACORT) 55 MCG/ACT AERO nasal inhaler Place 2 sprays into the nose daily.      Current Facility-Administered Medications on File Prior to Encounter  Medication Dose Route Frequency Provider Last Rate Last Admin  . bivalirudin (ANGIOMAX) 250 mg in sodium chloride 0.9 % 50 mL (5 mg/mL) infusion    Continuous PRN Kathleene Hazel, MD   Stopped at 09/20/15 [phone]  . bivalirudin (ANGIOMAX) BOLUS via infusion    PRN Kathleene Hazel, MD   54.45 mg at 09/20/15 9622  . fentaNYL (SUBLIMAZE) injection    PRN Kathleene Hazel, MD   25 mcg at 09/20/15 0651  . heparin 1,500 mL    PRN Kathleene Hazel, MD   Given at 09/20/15 [phone]  . heparin infusion 2 units/mL in 0.9 % sodium chloride    Continuous PRN Kathleene Hazel, MD   1,500 mL at 09/20/15 0733  . iopamidol (ISOVUE-370) 76 % injection    PRN Kathleene Hazel, MD   165 mL at 09/20/15 0733  . lidocaine (PF) (XYLOCAINE) 1 % injection    PRN Kathleene Hazel, MD   2 mL at 09/20/15 8921  . midazolam (VERSED) injection    PRN Kathleene Hazel, MD   1 mg at 09/20/15 0651  . nitroGLYCERIN 1 mg/10 ml (100 mcg/ml) - IR/CATH LAB    PRN Kathleene Hazel, MD   200 mcg at 09/20/15 1941  . Radial Cocktail/Verapamil only    PRN Kathleene Hazel, MD   10 mL at 09/20/15 0629  . ticagrelor (BRILINTA) tablet    PRN Kathleene Hazel, MD   180 mg at 09/20/15 [phone]  . tirofiban (AGGRASTAT) bolus via infusion    PRN Kathleene Hazel, MD   1,815 mcg at 09/20/15 [phone]  . tirofiban (AGGRASTAT) infusion 50 mcg/mL 100 mL    Continuous PRN Kathleene Hazel, MD 13.1 mL/hr at 09/20/15 0651 0.15 mcg/kg/min at 09/20/15 0651    BP 100/60   Pulse 60   Wt 68.3 kg (150 lb 9.6 oz)   SpO2 100%   BMI 22.24 kg/m  General: NAD Neck: No JVD, no thyromegaly or thyroid nodule.  Lungs: Clear to auscultation bilaterally with normal respiratory effort. CV: Nondisplaced PMI.  Heart regular S1/S2, no S3/S4, no murmur.  No peripheral edema.  No carotid bruit.  Normal pedal pulses.  Abdomen: Soft, nontender, no hepatosplenomegaly, no distention.  Skin: Intact without lesions or rashes.  Neurologic: Alert and oriented x 3.  Psych: Normal affect. Extremities: No clubbing or cyanosis.  HEENT: Normal.   Assessment/Plan: 1. CAD: S/p anterior MI in 7/17 with DES to RCA.  Cath in 10/19 with no obstructive CAD.  No further chest pain.  - She is off Plavix now and taking only Xarelto 20 mg daily.  - She is tolerating Crestor without myalgias.  Good lipids in 10/21.       2. Chronic systolic CHF: Ischemic cardiomyopathy.  EF 25% on echo in 10/17, 30-35% on echo in 11/17, 30% on TEE in 8/18. She has extensive LAD-territory scar. 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  Echo today shows that EF remains 25-30% with LAD-territory scar.  She is not volume  overloaded on exam or by Corvue. NYHA class II symptoms.  No BP room to adjust meds today.  - Off Coreg with low BP and addition of sotalol.   - Continue losartan 12.5 mg qhs.   - She did not tolerate dapagliflozin due to yeast infections.  - Continue Lasix 20 mg daily.    - Continue spironolactone 25 mg daily.  BMET today.  - QRS does not appear to be wide enough that she would have benefit from CRT.  3. Mitral regurgitation: Moderate to severe on 10/17 echo, severe on 11/17 echo.  I did a TEE in 8/18.  This showed moderate MR that appeared to be functional.  No indication for surgery or Mitraclip. Echo in 1/22 showed mild-moderate MR.  4. Atrial fibrillation: Unable to tolerate amiodarone due to GI side effects.  She is in NSR today on Xarelto 20 mg daily and sotalol. QTc interval is acceptable on sotalol on today's ECG.    5. PVCs: Very frequent on 12/18 holter (41% beats), she has had a PVC ablation in 2/19. Due to recurrence of PVCs post-ablation, she is now on sotalol. She does not seem to have many PVCs.  - Continue sotalol, QTc ok on ECG today.  - With chronic GI bleeding (FOBT+ though site of bleeding has not been found), I am going to transition her from Xarelto to Eliquis 5 mg bid as Eliquis appears to have at least mildly decreased bleeding risk compared to Xarelto.  6. Hyperlipidemia: Tolerating Crestor, good lipids in 10/21. 7. Fe deficiency anemia: Associated with dark stool/melena, FOBT+.  Hgb down to 8 at one point, has had IV Fe and transfusion.  C-scope, capsule endoscopy, and EGD not revealing of definite cause. - Transitioning from Xarelto to Eliquis as above.   Followup 3 months  Marca Ancona 03/25/2020

## 2020-03-31 NOTE — Progress Notes (Signed)
Remote ICD transmission.   

## 2020-04-10 ENCOUNTER — Other Ambulatory Visit (HOSPITAL_COMMUNITY): Payer: Self-pay

## 2020-04-10 MED ORDER — LOSARTAN POTASSIUM 25 MG PO TABS: 12.5000 mg | ORAL_TABLET | Freq: Every day | ORAL | 3 refills | Status: DC

## 2020-04-13 ENCOUNTER — Other Ambulatory Visit (HOSPITAL_COMMUNITY): Payer: Self-pay | Admitting: Cardiology

## 2020-04-16 ENCOUNTER — Other Ambulatory Visit: Payer: Self-pay

## 2020-04-16 ENCOUNTER — Inpatient Hospital Stay (HOSPITAL_COMMUNITY): Payer: BC Managed Care – PPO | Attending: Hematology

## 2020-04-16 ENCOUNTER — Encounter: Payer: Self-pay | Admitting: Gastroenterology

## 2020-04-16 DIAGNOSIS — Z7901 Long term (current) use of anticoagulants: Secondary | ICD-10-CM | POA: Diagnosis not present

## 2020-04-16 DIAGNOSIS — D509 Iron deficiency anemia, unspecified: Secondary | ICD-10-CM | POA: Insufficient documentation

## 2020-04-16 DIAGNOSIS — I2510 Atherosclerotic heart disease of native coronary artery without angina pectoris: Secondary | ICD-10-CM | POA: Insufficient documentation

## 2020-04-16 DIAGNOSIS — D649 Anemia, unspecified: Secondary | ICD-10-CM

## 2020-04-16 DIAGNOSIS — I5022 Chronic systolic (congestive) heart failure: Secondary | ICD-10-CM | POA: Diagnosis not present

## 2020-04-16 DIAGNOSIS — N183 Chronic kidney disease, stage 3 unspecified: Secondary | ICD-10-CM | POA: Diagnosis not present

## 2020-04-16 DIAGNOSIS — E039 Hypothyroidism, unspecified: Secondary | ICD-10-CM | POA: Insufficient documentation

## 2020-04-16 NOTE — Progress Notes (Signed)
Referring Provider: Benita Stabile, MD Primary Care Physician:  Benita Stabile, MD Primary GI Physician: Dr. Jena Gauss  Chief Complaint  Patient presents with   Anemia    No blood in stool/dark stool. Doing better with iron infusions. Xarelto changed to Eliquis   Gastroesophageal Reflux    ok    HPI:   Catherine Mays is a 55 y.o. female presenting today with a history of iron deficiency anemia, a. Fib chronically anticoagulated with Xarelto, GERD, and epigastric burning associated with certain dietary triggers (carbs, pizza, and coffee). EGD April 2021 with normal esophagus, small hiatal hernia, normal examined stomach and duodenum.  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  Colonoscopy June 2021 with diverticulosis in the sigmoid colon, nonbleeding internal hemorrhoids, otherwise normal exam.   Last seen in our office 09/28/19 with no overt GI bleeding. Stools dark on oral iron.  GERD and nausea significantly improved with Protonix twice daily.  Intermittent epigastric burning of eating carbs, pizza, or coffee.  Denied NSAIDs.  Advised to continue Protonix 40 mg BID, avoid dietary triggers, and proceed with Givens capsule.   Givens capsule September 2021 essentially unremarkable except for tiny erosion in the proximal small bowel. Suggested updating labs.    Labs 01/13/2020 with hemoglobin 11.6, ferritin 11.7 (down from 69 in July 2021), celiac serologies negative.  Notably, patient was not taking an iron supplement.  Recommended she resume ferrous sulfate 325 mg daily with repeat iron panel in 8 weeks.  Patient called back requesting alternative to oral iron as this caused severe constipation.  Recommended referral to hematology.  Most recent labs 02/10/2020 with hemoglobin 10.1, ferritin 8.  She has established with hematology, received Venofer x5 in January 2022, and has follow-up with Dr. Ellin Saba on  04/19/2020.  Today:   IDA: Cardiology changed Xarelto to Eliquis about 1 month ago. No brbpr, melena, hematuria, or vaginal bleeding. Oral iron caused abdominal pain, dark stools, and constipation. Feels much better with iron infusion.  Energy level improved. Had total of 5 iron infusions.   GERD: Taking Protonix 40 mg BID. Notices having to cough and or clear her throat after breakfast and lunch. Started noticing this in the last 1-2 months. Resumed drinking Mt Dew a couple times a week. Coffee maybe twice a week. Orange juice a few times a week.  Avoid fried, fatty, greasy, spicy foods.  No dysphagia. No abdominal pain, nausea, or vomiting.   Past Medical History:  Diagnosis Date   AICD (automatic cardioverter/defibrillator) present    St. Jude   CAD in native artery    a. late recognition of presentation of STEMI 08/2015 s/p DES to LAD, mild residual mRCA.   Chronic systolic CHF (congestive heart failure) (HCC)    CKD (chronic kidney disease), stage III (HCC)    Hypotension    a. preventing med titration for HF.   Hypothyroidism 09/24/2015   Ischemic cardiomyopathy    Myocardial infarction [facility]) 08/2015   PAF (paroxysmal atrial fibrillation) (HCC) 09/24/2015   a. dx at time of STEMI 08/2015.   Pre-diabetes    Presence of permanent cardiac pacemaker    patient has ICD   PVC's (premature ventricular contractions)     Past Surgical History:  Procedure Laterality Date   BIOPSY  06/16/2019   Procedure: BIOPSY;  Surgeon: Corbin Ade, MD;  Location: [facility];  Service: Endoscopy;;   CARDIAC CATHETERIZATION N/A 09/20/2015   Procedure: Left Heart  Cath and Coronary Angiography;  Surgeon: Kathleene Hazel, MD;  [facility] INVAS[facility] CV LAB;  Service: Cardiovascular;  Laterality: N/A;   CARDIAC CATHETERIZATION N/A 09/20/2015   Procedure: Coronary Stent Intervention;  Surgeon: Kathleene Hazel, MD;  Location: [facility] INVASIVE CV LAB;  Service: Cardiovascular;   Laterality: N/A;   CARDIAC CATHETERIZATION N/A 09/21/2015   Procedure: Left Heart Cath and Coronary Angiography;  Surgeon: Tonny Bollman, MD;  Location: [facility] INVASIVE CV LAB;  Service: Cardiovascular;  Laterality: N/A;   CARDIAC CATHETERIZATION N/A 11/26/2015   Procedure: Right Heart Cath;  Surgeon: [name], MD;  Location: [facility] INVASIVE CV LAB;  Service: Cardiovascular;  Laterality: N/A;   COLONOSCOPY WITH PROPOFOL N/A 11/23/2017   Dr. Jena Gauss: Diverticulosis, internal hemorrhoids, next colonoscopy in 10 years   COLONOSCOPY WITH PROPOFOL N/A 08/10/2019   Procedure: COLONOSCOPY WITH PROPOFOL;  Surgeon: Corbin Ade, MD;  Diverticulosis in sigmoid colon, nonbleeding internal hemorrhoids, otherwise normal exam.     CORONARY STENT PLACEMENT  09/20/2015   EP IMPLANTABLE DEVICE N/A 01/23/2016   Procedure: ICD Implant;  Surgeon: Will Jorja Loa, MD;  Location: [facility] INVASIVE CV LAB;  Service: Cardiovascular;  Laterality: N/A;   ESOPHAGOGASTRODUODENOSCOPY (EGD) WITH PROPOFOL N/A 06/16/2019   Procedure: ESOPHAGOGASTRODUODENOSCOPY (EGD) WITH PROPOFOL;  Surgeon: Corbin Ade, MD;  Normal esophagus, small hiatal hernia, normal examined stomach, normal examined duodenum.  Gastric biopsy with slight chronic inflammation, duodenal biopsy with slight intramucosal Brunner gland hyperplasia.   GIVENS CAPSULE STUDY N/A 11/23/2019    Surgeon: Corbin Ade, MD; essentially unremarkable except for tiny erosion in the proximal small bowel.   LEFT HEART CATH AND CORONARY ANGIOGRAPHY N/A 12/18/2017   Procedure: LEFT HEART CATH AND CORONARY ANGIOGRAPHY;  Surgeon: [name], MD;  Location: [facility] INVASIVE CV LAB;  Service: Cardiovascular;  Laterality: N/A;   PVC ABLATION N/A 04/02/2017   Procedure: PVC ABLATION;  Surgeon: Regan Lemming, MD;  Location: [facility] INVASIVE CV LAB;  Service: Cardiovascular;  Laterality: N/A;   TEE WITHOUT CARDIOVERSION N/A 10/08/2016   Procedure: TRANSESOPHAGEAL  ECHOCARDIOGRAM (TEE);  Surgeon: [name], MD;  Location: [facility] ENDOSCOPY;  Service: Cardiovascular;  Laterality: N/A;   THYROIDECTOMY      Current Outpatient Medications  Medication Sig Dispense Refill   apixaban (ELIQUIS) 5 MG TABS tablet Take 1 tablet (5 mg total) by mouth 2 (two) times daily. 180 tablet 3   buPROPion (WELLBUTRIN XL) 300 MG 24 hr tablet Take 300 mg by mouth daily.     Calcium Carb-Cholecalciferol (CALCIUM + D3 PO) Take 1 tablet by mouth every morning.     Coenzyme Q10 (COQ10) 200 MG CAPS Take 200 mg by mouth daily.     dexlansoprazole (DEXILANT) 60 MG capsule Take 1 capsule (60 mg total) by mouth daily. 90 capsule 3   fexofenadine (ALLEGRA) 180 MG tablet Take 180 mg by mouth daily.     furosemide (LASIX) 20 MG tablet TAKE 1 TABLET(20 MG) BY MOUTH DAILY AS NEEDED 30 tablet 5   losartan (COZAAR) 25 MG tablet Take 0.5 tablets (12.5 mg total) by mouth at bedtime. 15 tablet 3   nitroGLYCERIN (NITROSTAT) 0.4 MG SL tablet PLACE 1 TABLET UNDER THE TONGUE EVERY 5 MINUTES AS NEEDED FOR CHEST PAIN 25 tablet 3   potassium chloride SA (KLOR-CON) 20 MEQ tablet Take 40 mEq by mouth every other day.      rosuvastatin (CRESTOR) 10 MG tablet Take 1 tablet (10 mg total) by mouth daily. 90 tablet 3  sotalol (BETAPACE) 160 MG tablet TAKE 1 TABLET BY MOUTH EVERY MORNING AND 1/2 TABLET EVERY EVENING 45 tablet 6   spironolactone (ALDACTONE) 25 MG tablet Take 1 tablet (25 mg total) by mouth daily. 30 tablet 11   thyroid (ARMOUR) 120 MG tablet Take 120 mg by mouth daily before breakfast.     traZODone (DESYREL) 50 MG tablet Take 0.5 tablets (25 mg total) by mouth at bedtime as needed for sleep. 30 tablet 0   triamcinolone (NASACORT) 55 MCG/ACT AERO nasal inhaler Place 2 sprays into the nose daily.      No current facility-administered medications for this visit.   Facility-Administered Medications Ordered in Other Visits  Medication Dose Route Frequency Provider Last Rate  Last Admin   bivalirudin (ANGIOMAX) 250 mg in sodium chloride 0.9 % 50 mL (5 mg/mL) infusion    Continuous PRN Kathleene Hazel, MD   Stopped at 09/20/15 0733   bivalirudin (ANGIOMAX) BOLUS via infusion    PRN Kathleene Hazel, MD   54.45 mg at 09/20/15 2947   fentaNYL (SUBLIMAZE) injection    PRN Kathleene Hazel, MD   25 mcg at 09/20/15 6546   heparin 1,500 mL    PRN Kathleene Hazel, MD   Given at 09/20/15 [phone]   heparin infusion 2 units/mL in 0.9 % sodium chloride    Continuous PRN Kathleene Hazel, MD   1,500 mL at 09/20/15 0733   iopamidol (ISOVUE-370) 76 % injection    PRN Kathleene Hazel, MD   165 mL at 09/20/15 0733   lidocaine (PF) (XYLOCAINE) 1 % injection    PRN Kathleene Hazel, MD   2 mL at 09/20/15 4656   midazolam (VERSED) injection    PRN Kathleene Hazel, MD   1 mg at 09/20/15 8127   nitroGLYCERIN 1 mg/10 ml (100 mcg/ml) - IR/CATH LAB    PRN Kathleene Hazel, MD   200 mcg at 09/20/15 5170   Radial Cocktail/Verapamil only    PRN Kathleene Hazel, MD   10 mL at 09/20/15 0174   ticagrelor (BRILINTA) tablet    PRN Kathleene Hazel, MD   180 mg at 09/20/15 9449   tirofiban (AGGRASTAT) bolus via infusion    PRN Kathleene Hazel, MD   1,815 mcg at 09/20/15 6759   tirofiban (AGGRASTAT) infusion 50 mcg/mL 100 mL    Continuous PRN Kathleene Hazel, MD 13.1 mL/hr at 09/20/15 0651 0.15 mcg/kg/min at 09/20/15 0651    Allergies as of 04/18/2020 - Review Complete 04/18/2020  Allergen Reaction Noted   Paxil [paroxetine] Palpitations 11/23/2015   Morphine and related Nausea And Vomiting 11/23/2015   Percocet [oxycodone-acetaminophen] Nausea And Vomiting 11/23/2015   Cefdinir Nausea Only 11/23/2015   Zolpidem Other (See Comments) 11/23/2015    Family History  Problem Relation Age of Onset   Heart attack Father    Arrhythmia Mother    Lung cancer Mother        bronchiectasis    Colon cancer Neg Hx    Colon polyps Neg Hx     Social History   Socioeconomic History   Marital status: Married    Spouse name: Not on file   Number of children: Not on file   Years of education: Not on file   Highest education level: Not on file  Occupational History   Not on file  Tobacco Use   Smoking status: Never Smoker   Smokeless tobacco: Never Used  Vaping Use  Vaping Use: Never used  Substance and Sexual Activity   Alcohol use: No   Drug use: No   Sexual activity: Not on file  Other Topics Concern   Not on file  Social History Narrative   Not on file   Social Determinants of Health   Financial Resource Strain: Low Risk    Difficulty of Paying Living Expenses: Not hard at all  Food Insecurity: No Food Insecurity   Worried About Programme researcher, broadcasting/film/video in the Last Year: Never true   Ran Out of Food in the Last Year: Never true  Transportation Needs: No Transportation Needs   Lack of Transportation (Medical): No   Lack of Transportation (Non-Medical): No  Physical Activity: Insufficiently Active   Days of Exercise per Week: 3 days   Minutes of Exercise per Session: 30 min  Stress: No Stress Concern Present   Feeling of Stress : Not at all  Social Connections: Socially Integrated   Frequency of Communication with Friends and Family: More than three times a week   Frequency of Social Gatherings with Friends and Family: More than three times a week   Attends Religious Services: More than 4 times per year   Active Member of [employer] or Organizations: Yes   Attends Engineer, structural: More than 4 times per year   Marital Status: Married    Review of Systems: Gen: Denies fever, chills, cold or flulike symptoms.  Admits to intermittent lightheadedness with position changes.  CV: Denies chest pain, palpitations Resp: Denies dyspnea or cough. GI: See HPI Heme: See HPI  Physical Exam: BP 95/63    Pulse 66    Temp (!) 96.3 F  (35.7 C) (Temporal)    Ht 5\' 9"  (1.753 m)    Wt 148 lb 12.8 oz (67.5 kg)    BMI 21.97 kg/m  General:   Alert and oriented. No distress noted. Pleasant and cooperative.  Head:  Normocephalic and atraumatic. Eyes:  Conjuctiva clear without scleral icterus. Heart:  S1, S2 present without murmurs appreciated. Lungs:  Clear to auscultation bilaterally. No wheezes, rales, or rhonchi. No distress.  Abdomen:  +BS, soft, non-tender and non-distended. No rebound or guarding. No HSM or masses noted. Msk:  Symmetrical without gross deformities. Normal posture. Extremities:  Without edema. Neurologic:  Alert and  oriented x4 Psych: Normal mood and affect.

## 2020-04-17 LAB — KAPPA/LAMBDA LIGHT CHAINS
Kappa free light chain: 20.5 mg/L — ABNORMAL HIGH (ref 3.3–19.4)
Kappa, lambda light chain ratio: 1.5 (ref 0.26–1.65)
Lambda free light chains: 13.7 mg/L (ref 5.7–26.3)

## 2020-04-18 ENCOUNTER — Other Ambulatory Visit: Payer: Self-pay

## 2020-04-18 ENCOUNTER — Encounter: Payer: Self-pay | Admitting: Gastroenterology

## 2020-04-18 ENCOUNTER — Telehealth: Payer: BC Managed Care – PPO | Admitting: Gastroenterology

## 2020-04-18 VITALS — BP 95/63 | HR 66 | Temp 96.3°F | Ht 69.0 in | Wt 148.8 lb

## 2020-04-18 DIAGNOSIS — D509 Iron deficiency anemia, unspecified: Secondary | ICD-10-CM | POA: Diagnosis not present

## 2020-04-18 DIAGNOSIS — K219 Gastro-esophageal reflux disease without esophagitis: Secondary | ICD-10-CM

## 2020-04-18 MED ORDER — DEXLANSOPRAZOLE 60 MG PO CPDR: 60.0000 mg | DELAYED_RELEASE_CAPSULE | Freq: Every day | ORAL | 3 refills | Status: DC

## 2020-04-18 NOTE — Assessment & Plan Note (Addendum)
Chronic.  Previously well controlled on Protonix 40 mg twice daily.  However, over the last 1-2 months, she has noticed having to cough and clear her throat after eating breakfast and lunch.  No dysphagia, abdominal pain, nausea, or vomiting.  She has started drinking [facility] a couple times a week, other wise, no significant dietary changes.  In general, she avoids fried, fatty, greasy, spicy foods.  Plan:  1.  Stop Protonix and try Dexilant 60 mg daily.  Prescription sent to pharmacy.  90-day supply sent as it appeared insurance would cover this better. 2.  Counseled on GERD diet.  Handout was provided. 3.  Follow-up in 3 months.  Advised to call with questions or concerns prior.

## 2020-04-18 NOTE — Progress Notes (Signed)
CC'ED TO PCP 

## 2020-04-18 NOTE — Assessment & Plan Note (Addendum)
55 year old female with history significant for MI, now with CHF and ICD, atrial fibrillation chronically anticoagulated (previously Xarelto now on Eliquis x1 month), and mild CKD found to have iron deficiency anemia last year.  EGD in April 2021 with small hiatal hernia, otherwise normal exam.  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  IFOBT + May 2021.  Colonoscopy June 2021 with diverticulosis, nonbleeding internal hemorrhoids, otherwise normal exam.  Givens capsule September 2021 essentially unremarkable except for tiny erosion in the proximal small bowel.  She was referred to hematology for iron infusions.  Most recent labs December 2021 with hemoglobin 10.8, ferritin 8.  She later established with hematology and received Venofer x5.  No repeat CBC since then, but has follow-up with Dr. Ellin Saba on 2/24.  Denies BRBPR, melena, hematuria, or vaginal bleeding.  She is feeling much better since iron infusions.   Etiology of IDA has not been identified.  Query obscure GI bleed in the setting of chronic anticoagulation.  She will continue to follow closely with hematology and receive iron infusions as needed.  She is also maintained on PPI for GERD as discussed above.

## 2020-04-18 NOTE — Patient Instructions (Addendum)
Please stop Protonix and try Dexilant 60 mg daily.  I have sent a prescription to your pharmacy.  90-day supply was sent as it appeared insurance would cover this better. Please let us know if this medication is too expensive.   Continue to avoid fried, fatty, greasy, spicy foods.  Recommend trying to stop drinking [facility] and avoiding carbonated beverages.  Limit caffeine and citrus items.  Continue following with hematology for iron deficiency anemia.  We will plan to follow-up with you in 3 months.  Do not hesitate to call if you have any questions or concerns prior.  It was good to see you today!  Catherine Memos, PA-C [facility]      Food Choices for Gastroesophageal Reflux Disease, Adult When you have gastroesophageal reflux disease (GERD), the foods you eat and your eating habits are very important. Choosing the right foods can help ease your discomfort. Think about working with a food expert (dietitian) to help you make good choices. What are tips for following this plan? Reading food labels  Look for foods that are low in saturated fat. Foods that may help with your symptoms include: ? Foods that have less than 5% of daily value (DV) of fat. ? Foods that have 0 grams of trans fat. Cooking  Do not fry your food.  Cook your food by baking, steaming, grilling, or broiling. These are all methods that do not need a lot of fat for cooking.  To add flavor, try to use herbs that are low in spice and acidity. Meal planning  Choose healthy foods that are low in fat, such as: ? Fruits and vegetables. ? Whole grains. ? Low-fat dairy products. ? Lean meats, fish, and poultry.  Eat small meals often instead of eating 3 large meals each day. Eat your meals slowly in a place where you are relaxed. Avoid bending over or lying down until 2-3 hours after eating.  Limit high-fat foods such as fatty meats or fried foods.  Limit your intake of fatty foods, such  as oils, butter, and shortening.  Avoid the following as told by your doctor: ? Foods that cause symptoms. These may be different for different people. Keep a food diary to keep track of foods that cause symptoms. ? Alcohol. ? Drinking a lot of liquid with meals. ? Eating meals during the 2-3 hours before bed.   Lifestyle  Stay at a healthy weight. Ask your doctor what weight is healthy for you. If you need to lose weight, work with your doctor to do so safely.  Exercise for at least 30 minutes on 5 or more days each week, or as told by your doctor.  Wear loose-fitting clothes.  Do not smoke or use any products that contain nicotine or tobacco. If you need help quitting, ask your doctor.  Sleep with the head of your bed higher than your feet. Use a wedge under the mattress or blocks under the bed frame to raise the head of the bed.  Chew sugar-free gum after meals. What foods should eat? Eat a healthy, well-balanced diet of fruits, vegetables, whole grains, low-fat dairy products, lean meats, fish, and poultry. Each person is different. Foods that may cause symptoms in one person may not cause any symptoms in another person. Work with your doctor to find foods that are safe for you. The items listed above may not be a complete list of what you can eat and drink. Contact a food expert for more options.  What foods should I avoid? Limiting some of these foods may help in managing the symptoms of GERD. Everyone is different. Talk with a food expert or your doctor to help you find the exact foods to avoid, if any. Fruits Any fruits prepared with added fat. Any fruits that cause symptoms. For some people, this may include citrus fruits, such as oranges, grapefruit, pineapple, and lemons. Vegetables Deep-fried vegetables. [place] fries. Any vegetables prepared with added fat. Any vegetables that cause symptoms. For some people, this may include tomatoes and tomato products, chili peppers,  onions and garlic, and horseradish. Grains Pastries or quick breads with added fat. Meats and other proteins High-fat meats, such as fatty beef or pork, hot dogs, ribs, ham, sausage, salami, and bacon. Fried meat or protein, including fried fish and fried chicken. Nuts and nut butters, in large amounts. Dairy Whole milk and chocolate milk. Sour cream. Cream. Ice cream. Cream cheese. Milkshakes. Fats and oils Butter. Margarine. Shortening. Ghee. Beverages Coffee and tea, with or without caffeine. Carbonated beverages. Sodas. Energy drinks. Fruit juice made with acidic fruits, such as orange or grapefruit. Tomato juice. Alcoholic drinks. Sweets and desserts Chocolate and cocoa. Donuts. Seasonings and condiments Pepper. Peppermint and spearmint. Added salt. Any condiments, herbs, or seasonings that cause symptoms. For some people, this may include curry, hot sauce, or vinegar-based salad dressings. The items listed above may not be a complete list of what you should not eat and drink. Contact a food expert for more options. Questions to ask your doctor Diet and lifestyle changes are often the first steps that are taken to manage symptoms of GERD. If diet and lifestyle changes do not help, talk with your doctor about taking medicines. Where to find more information  [employer] Gastrointestinal [employer]: aboutgerd.org Summary  When you have GERD, food and lifestyle choices are very important in easing your symptoms.  Eat small meals often instead of 3 large meals a day. Eat your meals slowly and in a place where you are relaxed.  Avoid bending over or lying down until 2-3 hours after eating.  Limit high-fat foods such as fatty meats or fried foods. This information is not intended to replace advice given to you by your health care provider. Make sure you discuss any questions you have with your health care provider. Document Revised: 08/22/2019 Document Reviewed:  08/22/2019 Elsevier Patient Education  2021 [employer].

## 2020-04-19 ENCOUNTER — Inpatient Hospital Stay (HOSPITAL_BASED_OUTPATIENT_CLINIC_OR_DEPARTMENT_OTHER): Payer: BC Managed Care – PPO | Admitting: Hematology

## 2020-04-19 ENCOUNTER — Inpatient Hospital Stay (HOSPITAL_COMMUNITY): Payer: BC Managed Care – PPO

## 2020-04-19 VITALS — Wt 152.0 lb

## 2020-04-19 DIAGNOSIS — D500 Iron deficiency anemia secondary to blood loss (chronic): Secondary | ICD-10-CM

## 2020-04-19 DIAGNOSIS — D509 Iron deficiency anemia, unspecified: Secondary | ICD-10-CM | POA: Diagnosis not present

## 2020-04-19 LAB — CBC WITH DIFFERENTIAL/PLATELET
Abs Immature Granulocytes: 0.01 10*3/uL (ref 0.00–0.07)
Basophils Absolute: 0 10*3/uL (ref 0.0–0.1)
Basophils Relative: 1 %
Eosinophils Absolute: 0.1 10*3/uL (ref 0.0–0.5)
Eosinophils Relative: 2 %
HCT: 37 % (ref 36.0–46.0)
Hemoglobin: 11.6 g/dL — ABNORMAL LOW (ref 12.0–15.0)
Immature Granulocytes: 0 %
Lymphocytes Relative: 31 %
Lymphs Abs: 1.4 10*3/uL (ref 0.7–4.0)
MCH: 28.9 pg (ref 26.0–34.0)
MCHC: 31.4 g/dL (ref 30.0–36.0)
MCV: 92 fL (ref 80.0–100.0)
Monocytes Absolute: 0.5 10*3/uL (ref 0.1–1.0)
Monocytes Relative: 10 %
Neutro Abs: 2.5 10*3/uL (ref 1.7–7.7)
Neutrophils Relative %: 56 %
Platelets: 143 10*3/uL — ABNORMAL LOW (ref 150–400)
RBC: 4.02 MIL/uL (ref 3.87–5.11)
RDW: 15.4 % (ref 11.5–15.5)
WBC: 4.4 10*3/uL (ref 4.0–10.5)
nRBC: 0 % (ref 0.0–0.2)

## 2020-04-19 LAB — IRON AND TIBC
Iron: 76 ug/dL (ref 28–170)
Saturation Ratios: 20 % (ref 10.4–31.8)
TIBC: 378 ug/dL (ref 250–450)
UIBC: 302 ug/dL

## 2020-04-19 LAB — FERRITIN: Ferritin: 90 ng/mL (ref 11–307)

## 2020-04-19 NOTE — Progress Notes (Signed)
[facility] [address], [place] [address]   CLINIC:  Medical Oncology/Hematology  PCP:  Benita Stabile, MD [address] [name] [place] [address]  [phone]  REASON FOR VISIT:  Follow-up for IDA  PRIOR THERAPY: Intermittent Feraheme last on 05/24/2019  CURRENT THERAPY: Intermittent Venofer last on 03/09/2020  INTERVAL HISTORY:  [name], a 55 y.o. female, returns for routine follow-up for her IDA. Catherine Mays was last seen by Dr. Burnice Logan Iruku on 02/10/2020.  Today she is accompanied by her husband and she reports feeling okay. She notes that her energy levels are starting to go down for the past 1 month and she reports that she felt better after getting the Venofer. She tried taking iron tablets but they gave her melena and constipation, but she denies having any melena now, hematochezia or hematuria. She is currently taking Eliquis after being switched from Xarelto. She gets occasional palpitations daily since her MI in 08/2015 but is able to do her ADL's. Dr. Jena Gauss checked for FOBT and it was positive.  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   REVIEW OF SYSTEMS:  Review of Systems  Constitutional: Positive for fatigue (40% & decreasing). Negative for appetite change.  Cardiovascular: Positive for palpitations (occasional PVC's).  Gastrointestinal: Negative for blood in stool and constipation.  Genitourinary: Negative for hematuria.   Neurological: Positive for headaches.  All other systems reviewed and are negative.   PAST MEDICAL/SURGICAL HISTORY:  Past Medical History:  Diagnosis Date  . AICD (automatic cardioverter/defibrillator) present    St. Jude  . CAD in native artery    a. late recognition of presentation of STEMI 08/2015 s/p DES to LAD, mild residual mRCA.  Marland Kitchen Chronic systolic CHF (congestive heart failure) (HCC)   . CKD (chronic kidney disease), stage III (HCC)   . Hypotension    a. preventing med titration  for HF.  Marland Kitchen Hypothyroidism 09/24/2015  . Ischemic cardiomyopathy   . Myocardial infarction (HCC) 08/2015  . PAF (paroxysmal atrial fibrillation) (HCC) 09/24/2015   a. dx at time of STEMI 08/2015.  . Pre-diabetes   . Presence of permanent cardiac pacemaker    patient has ICD  . PVC's (premature ventricular contractions)    Past Surgical History:  Procedure Laterality Date  . BIOPSY  06/16/2019   Procedure: BIOPSY;  Surgeon: Corbin Ade, MD;  [facility];  Service: Endoscopy;;  . CARDIAC CATHETERIZATION N/A 09/20/2015   Procedure: Left Heart Cath and Coronary Angiography;  Surgeon: Kathleene Hazel, MD;  Location: [facility] INVASIVE CV LAB;  Service: Cardiovascular;  Laterality: N/A;  . CARDIAC CATHETERIZATION N/A 09/20/2015   Procedure: Coronary Stent Intervention;  Surgeon: Kathleene Hazel, MD;  Location: [facility] INVASIVE CV LAB;  Service: Cardiovascular;  Laterality: N/A;  . CARDIAC CATHETERIZATION N/A 09/21/2015   Procedure: Left Heart Cath and Coronary Angiography;  Surgeon: Tonny Bollman, MD;  Location: [facility] INVASIVE CV LAB;  Service: Cardiovascular;  Laterality: N/A;  . CARDIAC CATHETERIZATION N/A 11/26/2015   Procedure: Right Heart Cath;  Surgeon: [name], MD;  Location: [facility] INVASIVE CV LAB;  Service: Cardiovascular;  Laterality: N/A;  . COLONOSCOPY WITH PROPOFOL N/A 11/23/2017   Dr. Jena Gauss: Diverticulosis, internal hemorrhoids, next colonoscopy in 10 years  . COLONOSCOPY WITH PROPOFOL N/A 08/10/2019   Procedure: COLONOSCOPY WITH PROPOFOL;  Surgeon: Corbin Ade, MD;  Diverticulosis in sigmoid colon, nonbleeding internal hemorrhoids, otherwise normal exam.    . CORONARY  STENT PLACEMENT  09/20/2015  . EP IMPLANTABLE DEVICE N/A 01/23/2016   Procedure: ICD Implant;  Surgeon: Will Jorja Loa, MD;  Location: [facility] INVASIVE CV LAB;  Service: Cardiovascular;  Laterality: N/A;  . ESOPHAGOGASTRODUODENOSCOPY (EGD) WITH PROPOFOL N/A 06/16/2019   Procedure:  ESOPHAGOGASTRODUODENOSCOPY (EGD) WITH PROPOFOL;  Surgeon: Corbin Ade, MD;  Normal esophagus, small hiatal hernia, normal examined stomach, normal examined duodenum.  Gastric biopsy with slight chronic inflammation, duodenal biopsy with slight intramucosal Brunner gland hyperplasia.  Marland Kitchen GIVENS CAPSULE STUDY N/A 11/23/2019    Surgeon: Corbin Ade, MD; essentially unremarkable except for tiny erosion in the proximal small bowel.  Marland Kitchen LEFT HEART CATH AND CORONARY ANGIOGRAPHY N/A 12/18/2017   Procedure: LEFT HEART CATH AND CORONARY ANGIOGRAPHY;  Surgeon: [name], MD;  Location: [facility] INVASIVE CV LAB;  Service: Cardiovascular;  Laterality: N/A;  . PVC ABLATION N/A 04/02/2017   Procedure: PVC ABLATION;  Surgeon: Regan Lemming, MD;  Location: [facility] INVASIVE CV LAB;  Service: Cardiovascular;  Laterality: N/A;  . TEE WITHOUT CARDIOVERSION N/A 10/08/2016   Procedure: TRANSESOPHAGEAL ECHOCARDIOGRAM (TEE);  Surgeon: [name], MD;  Location: [facility] ENDOSCOPY;  Service: Cardiovascular;  Laterality: N/A;  . THYROIDECTOMY      SOCIAL HISTORY:  Social History   Socioeconomic History  . Marital status: Married    Spouse name: Not on file  . Number of children: Not on file  . Years of education: Not on file  . Highest education level: Not on file  Occupational History  . Not on file  Tobacco Use  . Smoking status: Never Smoker  . Smokeless tobacco: Never Used  Vaping Use  . Vaping Use: Never used  Substance and Sexual Activity  . Alcohol use: No  . Drug use: No  . Sexual activity: Not on file  Other Topics Concern  . Not on file  Social History Narrative  . Not on file   Social Determinants of Health   Financial Resource Strain: Low Risk   . Difficulty of Paying Living Expenses: Not hard at all  Food Insecurity: No Food Insecurity  . Worried About Programme researcher, broadcasting/film/video in the Last Year: Never true  . Ran Out of Food in the Last Year: Never true  Transportation Needs: No  Transportation Needs  . Lack of Transportation (Medical): No  . Lack of Transportation (Non-Medical): No  Physical Activity: Insufficiently Active  . Days of Exercise per Week: 3 days  . Minutes of Exercise per Session: 30 min  Stress: No Stress Concern Present  . Feeling of Stress : Not at all  Social Connections: Socially Integrated  . Frequency of Communication with Friends and Family: More than three times a week  . Frequency of Social Gatherings with Friends and Family: More than three times a week  . Attends Religious Services: More than 4 times per year  . Active Member of Clubs or Organizations: Yes  . Attends Banker Meetings: More than 4 times per year  . Marital Status: Married  Catering manager Violence: Not At Risk  . Fear of Current or Ex-Partner: No  . Emotionally Abused: No  . Physically Abused: No  . Sexually Abused: No    FAMILY HISTORY:  Family History  Problem Relation Age of Onset  . Heart attack Father   . Arrhythmia Mother   . Lung cancer Mother        bronchiectasis  . Colon cancer Neg Hx   . Colon polyps Neg  Hx     CURRENT MEDICATIONS:  Current Outpatient Medications  Medication Sig Dispense Refill  . apixaban (ELIQUIS) 5 MG TABS tablet Take 1 tablet (5 mg total) by mouth 2 (two) times daily. 180 tablet 3  . buPROPion (WELLBUTRIN XL) 300 MG 24 hr tablet Take 300 mg by mouth daily.    . Calcium Carb-Cholecalciferol (CALCIUM + D3 PO) Take 1 tablet by mouth every morning.    . Coenzyme Q10 (COQ10) 200 MG CAPS Take 200 mg by mouth daily.    Marland Kitchen dexlansoprazole (DEXILANT) 60 MG capsule Take 1 capsule (60 mg total) by mouth daily. 90 capsule 3  . fexofenadine (ALLEGRA) 180 MG tablet Take 180 mg by mouth daily.    . furosemide (LASIX) 20 MG tablet TAKE 1 TABLET(20 MG) BY MOUTH DAILY AS NEEDED 30 tablet 5  . losartan (COZAAR) 25 MG tablet Take 0.5 tablets (12.5 mg total) by mouth at bedtime. 15 tablet 3  . nitroGLYCERIN (NITROSTAT) 0.4 MG SL  tablet PLACE 1 TABLET UNDER THE TONGUE EVERY 5 MINUTES AS NEEDED FOR CHEST PAIN 25 tablet 3  . potassium chloride SA (KLOR-CON) 20 MEQ tablet Take 40 mEq by mouth every other day.     . rosuvastatin (CRESTOR) 10 MG tablet Take 1 tablet (10 mg total) by mouth daily. 90 tablet 3  . sotalol (BETAPACE) 160 MG tablet TAKE 1 TABLET BY MOUTH EVERY MORNING AND 1/2 TABLET EVERY EVENING 45 tablet 6  . spironolactone (ALDACTONE) 25 MG tablet Take 1 tablet (25 mg total) by mouth daily. 30 tablet 11  . thyroid (ARMOUR) 120 MG tablet Take 120 mg by mouth daily before breakfast.    . traZODone (DESYREL) 50 MG tablet Take 0.5 tablets (25 mg total) by mouth at bedtime as needed for sleep. 30 tablet 0  . triamcinolone (NASACORT) 55 MCG/ACT AERO nasal inhaler Place 2 sprays into the nose daily.      No current facility-administered medications for this visit.   Facility-Administered Medications Ordered in Other Visits  Medication Dose Route Frequency Provider Last Rate Last Admin  . bivalirudin (ANGIOMAX) 250 mg in sodium chloride 0.9 % 50 mL (5 mg/mL) infusion    Continuous PRN Kathleene Hazel, MD   Stopped at 09/20/15 [phone]  . bivalirudin (ANGIOMAX) BOLUS via infusion    PRN Kathleene Hazel, MD   54.45 mg at 09/20/15 9629  . fentaNYL (SUBLIMAZE) injection    PRN Kathleene Hazel, MD   25 mcg at 09/20/15 0651  . heparin 1,500 mL    PRN Kathleene Hazel, MD   Given at 09/20/15 [phone]  . heparin infusion 2 units/mL in 0.9 % sodium chloride    Continuous PRN Kathleene Hazel, MD   1,500 mL at 09/20/15 0733  . iopamidol (ISOVUE-370) 76 % injection    PRN Kathleene Hazel, MD   165 mL at 09/20/15 0733  . lidocaine (PF) (XYLOCAINE) 1 % injection    PRN Kathleene Hazel, MD   2 mL at 09/20/15 1324  . midazolam (VERSED) injection    PRN Kathleene Hazel, MD   1 mg at 09/20/15 0651  . nitroGLYCERIN 1 mg/10 ml (100 mcg/ml) - IR/CATH LAB    PRN Kathleene Hazel,  MD   200 mcg at 09/20/15 4010  . Radial Cocktail/Verapamil only    PRN Kathleene Hazel, MD   10 mL at 09/20/15 0629  . ticagrelor (BRILINTA) tablet    PRN Kathleene Hazel, MD  180 mg at 09/20/15 0092  . tirofiban (AGGRASTAT) bolus via infusion    PRN Kathleene Hazel, MD   1,815 mcg at 09/20/15 [phone]  . tirofiban (AGGRASTAT) infusion 50 mcg/mL 100 mL    Continuous PRN Kathleene Hazel, MD 13.1 mL/hr at 09/20/15 0651 0.15 mcg/kg/min at 09/20/15 7622    ALLERGIES:  Allergies  Allergen Reactions  . Paxil [Paroxetine] Palpitations  . Morphine And Related Nausea And Vomiting  . Percocet [Oxycodone-Acetaminophen] Nausea And Vomiting  . Cefdinir Nausea Only  . Zolpidem Other (See Comments)    Doesn't work for patient at all    PHYSICAL EXAM:  Performance status (ECOG): 1 - Symptomatic but completely ambulatory  There were no vitals filed for this visit. Wt Readings from Last 3 Encounters:  04/19/20 152 lb (68.9 kg)  04/18/20 148 lb 12.8 oz (67.5 kg)  03/23/20 150 lb 9.6 oz (68.3 kg)   Physical Exam Vitals reviewed.  Constitutional:      Appearance: Normal appearance.  Cardiovascular:     Rate and Rhythm: Normal rate and regular rhythm.     Pulses: Normal pulses.     Heart sounds: Normal heart sounds.  Pulmonary:     Effort: Pulmonary effort is normal.     Breath sounds: Normal breath sounds.  Neurological:     General: No focal deficit present.     Mental Status: She is alert and oriented to person, place, and time.  Psychiatric:        Mood and Affect: Mood normal.        Behavior: Behavior normal.     LABORATORY DATA:  I have reviewed the labs as listed.  CBC Latest Ref Rng & Units 02/10/2020 02/10/2020 12/21/2019  WBC 4.0 - 10.5 K/uL 4.1 - 4.5  Hemoglobin 12.0 - 15.0 g/dL 10.1(L) - 10.9(L)  Hematocrit 34.0 - 46.6 % 30.8(L) 32.5(L) 34.4(L)  Platelets 150 - 400 K/uL 198 - 158   CMP Latest Ref Rng & Units 03/23/2020 12/21/2019 09/22/2019   Glucose 70 - 99 mg/dL 98 633(H) 545(G)  BUN 6 - 20 mg/dL 25(W) 38(L) 19  Creatinine 0.44 - 1.00 mg/dL 3.73 4.28(J) 6.81(L)  Sodium 135 - 145 mmol/L 140 138 138  Potassium 3.5 - 5.1 mmol/L 4.9 4.5 3.8  Chloride 98 - 111 mmol/L 107 103 102  CO2 22 - 32 mmol/L 27 26 27   Calcium 8.9 - 10.3 mg/dL 9.1 9.0 )  Total Protein 6.5 - 8.1 g/dL - - -  Total Bilirubin 0.3 - 1.2 mg/dL - - -  Alkaline Phos 38 - 126 U/L - - -  AST 15 - 41 U/L - - -  ALT 0 - 44 U/L - - -      Component Value Date/Time   RBC 3.49 (L) 02/10/2020 1050   RBC 3.46 (L) 02/10/2020 1049   MCV 93.9 02/10/2020 1049   MCV 92 03/24/2017 1313   MCH 29.2 02/10/2020 1049   MCHC 31.1 02/10/2020 1049   RDW 12.4 02/10/2020 1049   RDW 13.8 03/24/2017 1313   LYMPHSABS 1.3 02/10/2020 1049   LYMPHSABS 2.0 03/24/2017 1313   MONOABS 0.5 02/10/2020 1049   EOSABS 0.1 02/10/2020 1049   EOSABS 0.2 03/24/2017 1313   BASOSABS 0.0 02/10/2020 1049   BASOSABS 0.0 03/24/2017 1313   Lab Results  Component Value Date   TOTALPROTELP 6.4 02/10/2020   ALBUMINELP 3.7 02/10/2020   A1GS 0.3 02/10/2020   A2GS 0.7 02/10/2020   BETS 0.9 02/10/2020  GAMS 0.8 02/10/2020   MSPIKE Not Observed 02/10/2020   SPEI Comment 02/10/2020    Lab Results  Component Value Date   KPAFRELGTCHN 20.5 (H) 04/16/2020   LAMBDASER 13.7 04/16/2020   KAPLAMBRATIO 1.50 04/16/2020    DIAGNOSTIC IMAGING:  I have independently reviewed the scans and discussed with the patient. ECHOCARDIOGRAM COMPLETE  Result Date: 03/23/2020    ECHOCARDIOGRAM REPORT   Patient Name:   Catherine MeekerDEBORAH Mays Date of Exam: 03/23/2020 Medical Rec #:  [id number]      Height:       69.0 in Accession #:    [id number]     Weight:       146.6 lb Date of Birth:  [birth year]     BSA:          1.811 m Patient Age:    54 years       BP:           104/62 mmHg Patient Gender: F              HR:           63 bpm. Exam Location:  Inpatient Procedure: 2D Echo Indications:    congestive heart failure   History:        Patient has prior history of Echocardiogram examinations, most                 recent 02/21/2019. CHF; Defibrillator.  Sonographer:    Delcie RochLauren Pennington RDCS Referring Phys: [id number] DALTON S MCLEAN IMPRESSIONS  1. Left ventricular ejection fraction, by estimation, is 25 to 30%. The left ventricle has severely decreased function. The left ventricle demonstrates regional wall motion abnormalities the anteroseptal and inferoseptal walls are akinetic. The apex is akinetic. The mid to apical anterior wall is akinetic. The left ventricular internal cavity size was mildly dilated. Left ventricular diastolic parameters are consistent with Grade II diastolic dysfunction (pseudonormalization).  2. Right ventricular systolic function is normal. The right ventricular size is normal. There is normal pulmonary artery systolic pressure. The estimated right ventricular systolic pressure is 33.5 mmHg.  3. Left atrial size was mild to moderately dilated.  4. The mitral valve is normal in structure. Mild to moderate mitral valve regurgitation. No evidence of mitral stenosis.  5. The aortic valve is tricuspid. Aortic valve regurgitation is not visualized. No aortic stenosis is present.  6. The inferior vena cava is normal in size with greater than 50% respiratory variability, suggesting right atrial pressure of 3 mmHg. FINDINGS  Left Ventricle: Left ventricular ejection fraction, by estimation, is 25 to 30%. The left ventricle has severely decreased function. The left ventricle demonstrates regional wall motion abnormalities. The left ventricular internal cavity size was mildly  dilated. There is no left ventricular hypertrophy. Left ventricular diastolic parameters are consistent with Grade II diastolic dysfunction (pseudonormalization). Right Ventricle: The right ventricular size is normal. No increase in right ventricular wall thickness. Right ventricular systolic function is normal. There is normal pulmonary artery  systolic pressure. The tricuspid regurgitant velocity is 2.76 m/s, and  with an assumed right atrial pressure of 3 mmHg, the estimated right ventricular systolic pressure is 33.5 mmHg. Left Atrium: Left atrial size was mild to moderately dilated. Right Atrium: Right atrial size was normal in size. Pericardium: There is no evidence of pericardial effusion. Mitral Valve: The mitral valve is normal in structure. Mild to moderate mitral valve regurgitation. No evidence of mitral valve stenosis. Tricuspid Valve: The tricuspid valve is normal in structure. Tricuspid valve  regurgitation is trivial. Aortic Valve: The aortic valve is tricuspid. Aortic valve regurgitation is not visualized. No aortic stenosis is present. Pulmonic Valve: The pulmonic valve was normal in structure. Pulmonic valve regurgitation is trivial. Aorta: The aortic root is normal in size and structure. Venous: The inferior vena cava is normal in size with greater than 50% respiratory variability, suggesting right atrial pressure of 3 mmHg. IAS/Shunts: No atrial level shunt detected by color flow Doppler. Additional Comments: A pacer wire is visualized in the right ventricle.  LEFT VENTRICLE PLAX 2D LVIDd:         5.80 cm      Diastology LVIDs:         4.70 cm      LV e' medial:    6.53 cm/s LV PW:         0.90 cm      LV E/e' medial:  14.5 LV IVS:        0.60 cm      LV e' lateral:   12.60 cm/s LVOT diam:     1.80 cm      LV E/e' lateral: 7.5 LV SV:         69 LV SV Index:   38 LVOT Area:     2.54 cm  LV Volumes (MOD) LV vol d, MOD A4C: 110.0 ml LV vol s, MOD A4C: 74.6 ml LV SV MOD A4C:     110.0 ml RIGHT VENTRICLE             IVC RV S prime:     11.50 cm/s  IVC diam: 1.40 cm TAPSE (M-mode): 2.2 cm LEFT ATRIUM             Index       RIGHT ATRIUM           Index LA diam:        4.20 cm 2.32 cm/m  RA Area:     15.10 cm LA Vol (A2C):   78.3 ml 43.25 ml/m RA Volume:   39.10 ml  21.60 ml/m LA Vol (A4C):   59.5 ml 32.86 ml/m LA Biplane Vol: 70.8 ml  39.11 ml/m  AORTIC VALVE LVOT Vmax:   126.00 cm/s LVOT Vmean:  79.300 cm/s LVOT VTI:    0.272 m  AORTA Ao Root diam: 2.90 cm Ao Asc diam:  3.10 cm MITRAL VALVE               TRICUSPID VALVE MV Area (PHT): 4.49 cm    TR Peak grad:   30.5 mmHg MV Decel Time: 169 msec    TR Vmax:        276.00 cm/s MV E velocity: 94.70 cm/s MV A velocity: 60.40 cm/s  SHUNTS MV E/A ratio:  1.57        Systemic VTI:  0.27 m                            Systemic Diam: 1.80 cm Marca Ancona MD Electronically signed by Marca Ancona MD Signature Date/Time: 03/23/2020/10:19:45 AM    Final      ASSESSMENT:  1.  Iron deficiency anemia: -Colonoscopy on 08/10/2019 shows diverticulosis in the sigmoid colon, nonbleeding internal hemorrhoids. -EGD on 06/16/2019 with normal esophagus and small hiatal hernia.  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 -Fecal occult blood test positive in May 2021. -Capsule study in September 2021 essentially unremarkable  except for a tiny erosion in the proximal small bowel. -Etiology includes chronic bleeding from anticoagulation (Eliquis) and possibly some malabsorption. -Treated with 5 infusions of Venofer from 02/27/2020 to 03/09/2020.  2.  CAD: -Status post anterior MI in 7/17 with stents to RCA with. -She has chronic systolic CHF (ischemic cardiomyopathy) with EF 25% - 30%, status post AICD. -Atrial fibrillation on Eliquis.    PLAN:  1.  Iron deficiency anemia: -She felt very well after completion of Venofer on 03/09/2020. -Beginning of this month, she started to feel slightly weak. -Labs today shows ferritin 98% saturation 20.  Hemoglobin is 11.6. -Other nutritional deficiency work-up was negative. -Recommend Venofer 300 mg x 2. -RTC 3 months with repeat CBC, ferritin and iron panel.  2.  Elevated kappa light chains: -Free light chains show slightly elevated kappa light chain 20.5 with normal lambda light chains  13.7 and ratio 1.5. -SPEP is negative. -We will order immunofixation to complete the work-up.   Orders placed this encounter:  Orders Placed This Encounter  Procedures  . Immunofixation electrophoresis  . CBC with Differential/Platelet  . Ferritin  . Iron and TIBC     Doreatha Massed, MD [facility] [phone]   I, Drue Second, am acting as a scribe for Dr. Payton Mccallum.  I, Doreatha Massed MD, have reviewed the above documentation for accuracy and completeness, and I agree with the above.

## 2020-04-19 NOTE — Patient Instructions (Addendum)
[facility] at [facility] Discharge Instructions  You were seen today by Dr. Ellin Saba. He went over your recent results. You had labs today to check your iron levels and to determine if you need more iron. Dr. Ellin Saba will see you back in 3 months for labs and follow up.   Thank you for choosing [facility] at [facility] to provide your oncology and hematology care.  To afford each patient quality time with our provider, please arrive at least 15 minutes before your scheduled appointment time.   If you have a lab appointment with the Cancer Center please come in thru the Main Entrance and check in at the main information desk  You need to re-schedule your appointment should you arrive 10 or more minutes late.  We strive to give you quality time with our providers, and arriving late affects you and other patients whose appointments are after yours.  Also, if you no show three or more times for appointments you may be dismissed from the clinic at the providers discretion.     Again, thank you for choosing [facility].  Our hope is that these requests will decrease the amount of time that you wait before being seen by our physicians.       _____________________________________________________________  Should you have questions after your visit to [facility], please contact our office at [phone] between the hours of 8:00 a.m. and 4:30 p.m.  Voicemails left after 4:00 p.m. will not be returned until the following business day.  For prescription refill requests, have your pharmacy contact our office and allow 72 hours.    Cancer Center Support Programs:   > Cancer Support Group  2nd Tuesday of the month 1pm-2pm, Journey Room

## 2020-04-20 ENCOUNTER — Other Ambulatory Visit: Payer: Self-pay

## 2020-04-23 ENCOUNTER — Other Ambulatory Visit: Payer: Self-pay

## 2020-04-23 ENCOUNTER — Inpatient Hospital Stay (HOSPITAL_COMMUNITY): Payer: BC Managed Care – PPO

## 2020-04-23 VITALS — BP 94/57 | HR 51 | Temp 97.0°F | Resp 17

## 2020-04-23 DIAGNOSIS — D500 Iron deficiency anemia secondary to blood loss (chronic): Secondary | ICD-10-CM

## 2020-04-23 DIAGNOSIS — D509 Iron deficiency anemia, unspecified: Secondary | ICD-10-CM | POA: Diagnosis not present

## 2020-04-23 MED ORDER — LORATADINE 10 MG PO TABS
10.0000 mg | ORAL_TABLET | Freq: Once | ORAL | Status: AC
  Administered 2020-04-23: 10 mg via ORAL

## 2020-04-23 MED ORDER — SODIUM CHLORIDE 0.9 % IV SOLN: Freq: Once | INTRAVENOUS | Status: AC

## 2020-04-23 MED ORDER — ACETAMINOPHEN 325 MG PO TABS
ORAL_TABLET | ORAL | Status: AC
  Filled 2020-04-23: qty 2

## 2020-04-23 MED ORDER — FAMOTIDINE 20 MG PO TABS
ORAL_TABLET | ORAL | Status: AC
  Filled 2020-04-23: qty 1

## 2020-04-23 MED ORDER — ACETAMINOPHEN 325 MG PO TABS
650.0000 mg | ORAL_TABLET | Freq: Once | ORAL | Status: AC
  Administered 2020-04-23: 650 mg via ORAL

## 2020-04-23 MED ORDER — LORATADINE 10 MG PO TABS
ORAL_TABLET | ORAL | Status: AC
  Filled 2020-04-23: qty 1

## 2020-04-23 MED ORDER — SODIUM CHLORIDE 0.9 % IV SOLN
300.0000 mg | Freq: Once | INTRAVENOUS | Status: AC
  Administered 2020-04-23: 300 mg via INTRAVENOUS
  Filled 2020-04-23: qty 15

## 2020-04-23 MED ORDER — FAMOTIDINE 20 MG PO TABS
20.0000 mg | ORAL_TABLET | Freq: Once | ORAL | Status: AC
  Administered 2020-04-23: 20 mg via ORAL

## 2020-04-23 NOTE — Patient Instructions (Signed)
[facility] Discharge Instructions for Patients  Today you received the following.   To help prevent nausea and vomiting after your treatment, we encourage you to take your nausea medication If you develop nausea and vomiting that is not controlled by your nausea medication, call the clinic.   BELOW ARE SYMPTOMS THAT SHOULD BE REPORTED IMMEDIATELY:  *FEVER GREATER THAN 100.5 F  *CHILLS WITH OR WITHOUT FEVER  NAUSEA AND VOMITING THAT IS NOT CONTROLLED WITH YOUR NAUSEA MEDICATION  *UNUSUAL SHORTNESS OF BREATH  *UNUSUAL BRUISING OR BLEEDING  TENDERNESS IN MOUTH AND THROAT WITH OR WITHOUT PRESENCE OF ULCERS  *URINARY PROBLEMS  *BOWEL PROBLEMS  UNUSUAL RASH Items with * indicate a potential emergency and should be followed up as soon as possible.  Feel free to call the clinic should you have any questions or concerns. The clinic phone number is [phone].  Please show the CHEMO ALERT CARD at check-in to the Emergency Department and triage nurse.   

## 2020-04-23 NOTE — Progress Notes (Signed)
Patient presents for Venofer infusion.  Vital signs WNL.  No new complaints since last visit.  Venofer given today per MD orders.  Tolerated infusion without adverse affects.  Vital signs stable.  No complaints at this time.  Discharge from clinic ambulatory in stable condition.  Alert and oriented X 3.  Follow up with [facility] as scheduled.

## 2020-04-24 LAB — IMMUNOFIXATION ELECTROPHORESIS
IgA: 121 mg/dL (ref 87–352)
IgG (Immunoglobin G), Serum: 840 mg/dL (ref 586–1602)
IgM (Immunoglobulin M), Srm: 55 mg/dL (ref 26–217)
Total Protein ELP: 6.6 g/dL (ref 6.0–8.5)

## 2020-04-30 ENCOUNTER — Inpatient Hospital Stay (HOSPITAL_COMMUNITY): Payer: BC Managed Care – PPO | Attending: Hematology and Oncology

## 2020-04-30 ENCOUNTER — Other Ambulatory Visit: Payer: Self-pay

## 2020-04-30 VITALS — BP 93/63 | HR 51 | Temp 97.3°F | Resp 17

## 2020-04-30 DIAGNOSIS — D500 Iron deficiency anemia secondary to blood loss (chronic): Secondary | ICD-10-CM | POA: Diagnosis not present

## 2020-04-30 DIAGNOSIS — Z7901 Long term (current) use of anticoagulants: Secondary | ICD-10-CM | POA: Insufficient documentation

## 2020-04-30 MED ORDER — LORATADINE 10 MG PO TABS
ORAL_TABLET | ORAL | Status: AC
  Filled 2020-04-30: qty 1

## 2020-04-30 MED ORDER — FAMOTIDINE 20 MG PO TABS
20.0000 mg | ORAL_TABLET | Freq: Once | ORAL | Status: AC
  Administered 2020-04-30: 20 mg via ORAL

## 2020-04-30 MED ORDER — SODIUM CHLORIDE 0.9 % IV SOLN: Freq: Once | INTRAVENOUS | Status: AC

## 2020-04-30 MED ORDER — LORATADINE 10 MG PO TABS
10.0000 mg | ORAL_TABLET | Freq: Once | ORAL | Status: AC
Start: 2020-04-30 — End: 2020-04-30
  Administered 2020-04-30: 10 mg via ORAL

## 2020-04-30 MED ORDER — ACETAMINOPHEN 325 MG PO TABS
ORAL_TABLET | ORAL | Status: AC
  Filled 2020-04-30: qty 2

## 2020-04-30 MED ORDER — FAMOTIDINE 20 MG PO TABS
ORAL_TABLET | ORAL | Status: AC
  Filled 2020-04-30: qty 1

## 2020-04-30 MED ORDER — SODIUM CHLORIDE 0.9 % IV SOLN
300.0000 mg | Freq: Once | INTRAVENOUS | Status: AC
  Administered 2020-04-30: 300 mg via INTRAVENOUS
  Filled 2020-04-30: qty 15

## 2020-04-30 MED ORDER — ACETAMINOPHEN 325 MG PO TABS
650.0000 mg | ORAL_TABLET | Freq: Once | ORAL | Status: AC
  Administered 2020-04-30: 650 mg via ORAL

## 2020-04-30 NOTE — Patient Instructions (Signed)
[facility] Discharge Instructions for Patients Receiving Venofer  Today you received the following Venofer infusion.  To help prevent nausea and vomiting after your treatment, we encourage you to take your nausea medication    If you develop nausea and vomiting that is not controlled by your nausea medication, call the clinic.   BELOW ARE SYMPTOMS THAT SHOULD BE REPORTED IMMEDIATELY:  *FEVER GREATER THAN 100.5 F  *CHILLS WITH OR WITHOUT FEVER  NAUSEA AND VOMITING THAT IS NOT CONTROLLED WITH YOUR NAUSEA MEDICATION  *UNUSUAL SHORTNESS OF BREATH  *UNUSUAL BRUISING OR BLEEDING  TENDERNESS IN MOUTH AND THROAT WITH OR WITHOUT PRESENCE OF ULCERS  *URINARY PROBLEMS  *BOWEL PROBLEMS  UNUSUAL RASH Items with * indicate a potential emergency and should be followed up as soon as possible.  Feel free to call the clinic should you have any questions or concerns. The clinic phone number is [phone].  Please show the CHEMO ALERT CARD at check-in to the Emergency Department and triage nurse.

## 2020-04-30 NOTE — Progress Notes (Signed)
Patient presents today for Venofer infusion.  Vital signs WNL.  Patient has no new complaints since last visit.    Venofer infusion given today per MD orders.  Peripheral IV started and noted good blood return before and after infusion.  Stable during infusion without adverse affects.  Vital signs stable.  No complaints at this time.  Discharge from clinic ambulatory in stable condition.  Alert and oriented X 3.  Follow up with [facility] as scheduled.

## 2020-05-02 ENCOUNTER — Other Ambulatory Visit (HOSPITAL_COMMUNITY): Payer: Self-pay

## 2020-05-02 MED ORDER — FUROSEMIDE 20 MG PO TABS: ORAL_TABLET | ORAL | 1 refills | Status: DC

## 2020-05-13 ENCOUNTER — Other Ambulatory Visit (HOSPITAL_COMMUNITY): Payer: Self-pay | Admitting: Cardiology

## 2020-06-19 ENCOUNTER — Other Ambulatory Visit: Payer: Self-pay

## 2020-06-19 ENCOUNTER — Ambulatory Visit (HOSPITAL_COMMUNITY)
Admission: RE | Admit: 2020-06-19 | Discharge: 2020-06-19 | Disposition: A | Discharge status: Home / Self Care | Payer: BC Managed Care – PPO | Source: Ambulatory Visit | Attending: Cardiology | Admitting: Cardiology

## 2020-06-19 ENCOUNTER — Encounter (HOSPITAL_COMMUNITY): Payer: Self-pay | Admitting: Cardiology

## 2020-06-19 ENCOUNTER — Ambulatory Visit (INDEPENDENT_AMBULATORY_CARE_PROVIDER_SITE_OTHER): Payer: BC Managed Care – PPO

## 2020-06-19 VITALS — BP 90/58 | HR 50 | Wt 149.0 lb

## 2020-06-19 DIAGNOSIS — Z955 Presence of coronary angioplasty implant and graft: Secondary | ICD-10-CM | POA: Insufficient documentation

## 2020-06-19 DIAGNOSIS — I2510 Atherosclerotic heart disease of native coronary artery without angina pectoris: Secondary | ICD-10-CM | POA: Diagnosis not present

## 2020-06-19 DIAGNOSIS — K921 Melena: Secondary | ICD-10-CM | POA: Diagnosis not present

## 2020-06-19 DIAGNOSIS — R0602 Shortness of breath: Secondary | ICD-10-CM | POA: Insufficient documentation

## 2020-06-19 DIAGNOSIS — Z95810 Presence of automatic (implantable) cardiac defibrillator: Secondary | ICD-10-CM | POA: Diagnosis not present

## 2020-06-19 DIAGNOSIS — I5022 Chronic systolic (congestive) heart failure: Secondary | ICD-10-CM | POA: Diagnosis not present

## 2020-06-19 DIAGNOSIS — Z7901 Long term (current) use of anticoagulants: Secondary | ICD-10-CM | POA: Diagnosis not present

## 2020-06-19 DIAGNOSIS — I959 Hypotension, unspecified: Secondary | ICD-10-CM | POA: Insufficient documentation

## 2020-06-19 DIAGNOSIS — Z7902 Long term (current) use of antithrombotics/antiplatelets: Secondary | ICD-10-CM | POA: Diagnosis not present

## 2020-06-19 DIAGNOSIS — I252 Old myocardial infarction: Secondary | ICD-10-CM | POA: Diagnosis not present

## 2020-06-19 DIAGNOSIS — E785 Hyperlipidemia, unspecified: Secondary | ICD-10-CM | POA: Insufficient documentation

## 2020-06-19 DIAGNOSIS — I480 Paroxysmal atrial fibrillation: Secondary | ICD-10-CM | POA: Diagnosis not present

## 2020-06-19 DIAGNOSIS — I255 Ischemic cardiomyopathy: Secondary | ICD-10-CM

## 2020-06-19 DIAGNOSIS — Z79899 Other long term (current) drug therapy: Secondary | ICD-10-CM | POA: Insufficient documentation

## 2020-06-19 DIAGNOSIS — D509 Iron deficiency anemia, unspecified: Secondary | ICD-10-CM | POA: Insufficient documentation

## 2020-06-19 DIAGNOSIS — Z8249 Family history of ischemic heart disease and other diseases of the circulatory system: Secondary | ICD-10-CM | POA: Insufficient documentation

## 2020-06-19 DIAGNOSIS — I340 Nonrheumatic mitral (valve) insufficiency: Secondary | ICD-10-CM | POA: Diagnosis not present

## 2020-06-19 LAB — CUP PACEART REMOTE DEVICE CHECK
Battery Remaining Longevity: 67 mo
Battery Remaining Percentage: 63 %
Battery Voltage: 2.96 V
Brady Statistic RV Percent Paced: 1 %
Date Time Interrogation Session: 20220426020428
HighPow Impedance: 77 Ohm
HighPow Impedance: 77 Ohm
Implantable Lead Implant Date: 20171129
Implantable Lead Location: 753860
Implantable Pulse Generator Implant Date: 20171129
Lead Channel Impedance Value: 400 Ohm
Lead Channel Pacing Threshold Amplitude: 1.5 V
Lead Channel Pacing Threshold Pulse Width: 0.5 ms
Lead Channel Sensing Intrinsic Amplitude: 12 mV
Lead Channel Setting Pacing Amplitude: 2.5 V
Lead Channel Setting Pacing Pulse Width: 0.5 ms
Lead Channel Setting Sensing Sensitivity: 0.5 mV
Pulse Gen Serial Number: 7377983

## 2020-06-19 LAB — BASIC METABOLIC PANEL
Anion gap: 6 (ref 5–15)
BUN: 21 mg/dL — ABNORMAL HIGH (ref 6–20)
CO2: 28 mmol/L (ref 22–32)
Calcium: 8.9 mg/dL (ref 8.9–10.3)
Chloride: 103 mmol/L (ref 98–111)
Creatinine, Ser: 1 mg/dL (ref 0.44–1.00)
GFR, Estimated: >60 mL/min (ref >60)
Glucose, Bld: 83 mg/dL (ref 70–99)
Potassium: 4.6 mmol/L (ref 3.5–5.1)
Sodium: 137 mmol/L (ref 135–145)

## 2020-06-19 LAB — BRAIN NATRIURETIC PEPTIDE: B Natriuretic Peptide: 278.5 pg/mL — ABNORMAL HIGH (ref 0.0–100.0)

## 2020-06-19 MED ORDER — LOSARTAN POTASSIUM 25 MG PO TABS: 25.0000 mg | ORAL_TABLET | Freq: Every day | ORAL | 3 refills | Status: DC

## 2020-06-19 NOTE — Progress Notes (Signed)
PCP: Dr. Margo Aye HF Cardiology: Dr. Shirlee Latch  55 y.o. with history of CAD s/p anterior STEMI with DES to LAD, ischemic cardiomyopathy, mitral regurgitation, and paroxysmal atrial fibrillation presents for followup of CHF and CAD.  She was admitted in 7/17 with anterior STEMI.  She had a late presentation to the cath lab.  Echo in 10/17 showed EF about 25% with LAD-territory wall motion abnormalities.  She had peri-MI atrial fibrillation and was started on Xarelto.  She was put on amiodarone.   After discharge, she continued to feel poorly.  She developed nausea, anorexia, and significant fatigue.  She was admitted for evaluation in 10/17 given concern for low output heart failure.  However, stopping amiodarone essentially resolved her symptoms.  She had RHC showing preserved cardiac output but the PCWP was high due to prominent v-waves, presumably from significant mitral regurgitation.  Of note, she was in atrial fibrillation for a time during this admission.   She had St Jude ICD placed.  She is back at work for the [employer].   TEE in 8/18 to evaluate the mitral valve showed EF 30%, moderate MR (probably functional).    Patient was seen by Dr. Elberta Fortis and noted to have very frequent PVCs on device interrogation.  Holter in 12/18 showed 41% PVCs.  She was started on sotalol 80 mg bid and titrated up to 120 mg bid.  Coreg was stopped with the addition of sotalol and losartan was decreased to 12.5 mg daily due to low BP.  She had PVC ablation in 2/19 but PVCs recurred. Sotalol was increased to 160 mg bid.   Echo 8/19 showed EF 30% with regional wall motion abnormalities, normal RV size and systolic function, moderate MR.   She was admitted in 10/19 with chest pain.  Cath showed patent LAD stent and no obstructive CAD.   I started her on Farxiga but she had to stop it after developing a chronic yeast infection.   She was found to be anemic with Fe deficiency (hgb down to 8).  FOBT+. She has  had IV Fe and blood transfusion.  EGD and colonoscopy this year were fairly unremarkable.  She had diverticulosis.  Capsule endoscopy negative in 9/21.   Echo in 1/22 showed EF 25-30%, wall motion abnormalities in LAD distribution, mild-moderate MR, normal RV.   She seems to be doing well today.  Short of breath walking up hills, no problems walking on flat ground or doing yardwork.  Mild lightheadedness if she stands too fast. Weight down 1 lb.   St Jude device interrogation: decreased thoracic impedance recently, now back to baseline.    Labs (10/17): K 3.9, creatinine 1.24, hgb 10.3 Labs (11/17): K 4, creatinine 1.27 Labs (12/17): K 4.2, creatinine 1.12, BNP 439 Labs (2/18): K 4.7, creatinine 1.37 Labs (7/18): K 3.9, creatinine 1.14, hgb 11.7 Labs (12/18): K 4.5, creatinine 1.23 Labs (1/19): K 4.4, creatinine 1.14 Labs (2/19): LDL 97 (off atorvastatin), HDL 56 Labs (5/19): K 4.3, creatinine 1.1 Labs (10/19): K 4.2, creatinine 1.2, LDL 70, hgb 11.8 Labs (11/19): K 4.9, creatinine 1.17, hgb 11.8, LDL 68, low TSH  Labs (9/20): K 4.4, creatinine 1.34, LDL 52 Labs (12/20): K 4.6, creatinine 1.19 Labs (6/21): LDL 73, HDL 62, hgb 11.9, K 4.2, creatinine 1.14 Labs (7/21): hgb 11.2, K 3.8, creatinine 1.12 Labs (10/21): K 4.5, creatinine 1.38, LDL 53, HDL 50 Labs (1/22): K 4.9, creatinine 0.99 Labs (2/22): hgb 11.6, Fe studies normal  ECG (personally reviewed): NSR,  IVCD 132 msec, QTc 430 msec  PMH:  1. CAD: Anterior STEMI in 7/17 with late presentation to the cath lab. She had DES to proximal LAD.  No significant disease in other vessels.  - LHC (10/19): Patent LAD stent, no obstructive disease.  2. Chronic systolic CHF: Ischemic cardiomyopathy.  - Echo 10/17 with EF 25%, akinesis of the mid-apical anteroseptal and anterior walls, apical dyskinesis, moderate to severe MR with incomplete coaptation.  - RHC (10/17): mean RA 4, PA 59/17 mean 38, mean PCWP 27 with prominent V waves suggesting  significant MR, CI 3.06, PVR 2 WU.  - ACEI cough.  - Echo (11/17): EF 30-35%, normal RV size and systolic function, severe MR with restriction of posterior leaflet.   - Echo (8/19): EF 30% with regional wall motion abnormalities, normal RV size and systolic function, moderate MR.  - Echo (12/20): EF 30%, LAD wall motion abnormalities, normal RV, PASP 35, moderate functional MR.  - Echo (1/22): EF 25-30%, wall motion abnormalities in LAD distribution, mild-moderate MR, normal RV.  3. Atrial fibrillation: Paroxysmal.  Noted 7/17 admission peri-MI, also noted again 10/17 admission. She did not tolerate amiodarone due to nausea/anorexia.  4. Mitral regurgitation: Moderate to severe on 10/17 echo, incomplete coaptation.  ?Etiology, her MI (anterior) does not typically lead to ischemic MR. - Echo in 11/17 with severe MR, posterior leaflet restricted.  - TEE (8/18): EF 30%, moderately dilated LV with akinetic septal and anterior walls, moderate functional MR, normal RV size and systolic function.  - Echo (8/19): Moderate MR 5. Hyperlipidemia: Myalgias with atorvastatin.  6. PVCs: Holter 12/18 with 41% PVCs.  - PVC ablation in 2/19 with recurrence of PVCs.  7. GI bleeding:  - EGD (4/21): unremarkable.  - Colonoscopy (7/21): Diverticulosis.  - Capsule endoscopy (9/21): negative.   SH: Married, lives in [place], works for the [employer], no smoking or ETOH.   Family History  Problem Relation Age of Onset  . Heart attack Father   . Arrhythmia Mother   . Lung cancer Mother        bronchiectasis  . Colon cancer Neg Hx   . Colon polyps Neg Hx    ROS: All systems reviewed and negative except as per HPI.   Current Outpatient Medications on File Prior to Encounter  Medication Sig Dispense Refill  . apixaban (ELIQUIS) 5 MG TABS tablet Take 1 tablet (5 mg total) by mouth 2 (two) times daily. 180 tablet 3  . buPROPion (WELLBUTRIN XL) 300 MG 24 hr tablet Take 300 mg by mouth daily.    . Calcium  Carb-Cholecalciferol (CALCIUM + D3 PO) Take 1 tablet by mouth every morning.    . Coenzyme Q10 (COQ10) 200 MG CAPS Take 200 mg by mouth daily.    Marland Kitchen dexlansoprazole (DEXILANT) 60 MG capsule Take 1 capsule (60 mg total) by mouth daily. 90 capsule 3  . fexofenadine (ALLEGRA) 180 MG tablet Take 180 mg by mouth daily.    . furosemide (LASIX) 20 MG tablet Take 20 mg by mouth daily.    . nitroGLYCERIN (NITROSTAT) 0.4 MG SL tablet PLACE 1 TABLET UNDER THE TONGUE EVERY 5 MINUTES AS NEEDED FOR CHEST PAIN 25 tablet 3  . potassium chloride SA (KLOR-CON) 20 MEQ tablet Take 40 mEq by mouth every other day.     . rosuvastatin (CRESTOR) 10 MG tablet TAKE 1 TABLET(10 MG) BY MOUTH DAILY 90 tablet 3  . sotalol (BETAPACE) 160 MG tablet TAKE 1 TABLET BY MOUTH EVERY MORNING AND  1/2 TABLET EVERY EVENING 45 tablet 6  . spironolactone (ALDACTONE) 25 MG tablet Take 1 tablet (25 mg total) by mouth daily. 30 tablet 11  . thyroid (ARMOUR) 120 MG tablet Take 120 mg by mouth daily before breakfast.    . traZODone (DESYREL) 50 MG tablet Take 0.5 tablets (25 mg total) by mouth at bedtime as needed for sleep. 30 tablet 0  . triamcinolone (NASACORT) 55 MCG/ACT AERO nasal inhaler Place 2 sprays into the nose daily.      Current Facility-Administered Medications on File Prior to Encounter  Medication Dose Route Frequency Provider Last Rate Last Admin  . bivalirudin (ANGIOMAX) 250 mg in sodium chloride 0.9 % 50 mL (5 mg/mL) infusion    Continuous PRN Kathleene Hazel, MD   Stopped at 09/20/15 [phone]  . bivalirudin (ANGIOMAX) BOLUS via infusion    PRN Kathleene Hazel, MD   54.45 mg at 09/20/15 9735  . fentaNYL (SUBLIMAZE) injection    PRN Kathleene Hazel, MD   25 mcg at 09/20/15 0651  . heparin 1,500 mL    PRN Kathleene Hazel, MD   Given at 09/20/15 [phone]  . heparin infusion 2 units/mL in 0.9 % sodium chloride    Continuous PRN Kathleene Hazel, MD   1,500 mL at 09/20/15 0733  . iopamidol  (ISOVUE-370) 76 % injection    PRN Kathleene Hazel, MD   165 mL at 09/20/15 0733  . lidocaine (PF) (XYLOCAINE) 1 % injection    PRN Kathleene Hazel, MD   2 mL at 09/20/15 2426  . midazolam (VERSED) injection    PRN Kathleene Hazel, MD   1 mg at 09/20/15 0651  . nitroGLYCERIN 1 mg/10 ml (100 mcg/ml) - IR/CATH LAB    PRN Kathleene Hazel, MD   200 mcg at 09/20/15 8341  . Radial Cocktail/Verapamil only    PRN Kathleene Hazel, MD   10 mL at 09/20/15 0629  . ticagrelor (BRILINTA) tablet    PRN Kathleene Hazel, MD   180 mg at 09/20/15 [phone]  . tirofiban (AGGRASTAT) bolus via infusion    PRN Kathleene Hazel, MD   1,815 mcg at 09/20/15 [phone]  . tirofiban (AGGRASTAT) infusion 50 mcg/mL 100 mL    Continuous PRN Kathleene Hazel, MD 13.1 mL/hr at 09/20/15 0651 0.15 mcg/kg/min at 09/20/15 0651    BP (!) 90/58   Pulse (!) 50   Wt 67.6 kg (149 lb)   SpO2 100%   BMI 22.00 kg/m  General: NAD Neck: No JVD, no thyromegaly or thyroid nodule.  Lungs: Clear to auscultation bilaterally with normal respiratory effort. CV: Nondisplaced PMI.  Heart regular S1/S2, no S3/S4, no murmur.  No peripheral edema.  No carotid bruit.  Normal pedal pulses.  Abdomen: Soft, nontender, no hepatosplenomegaly, no distention.  Skin: Intact without lesions or rashes.  Neurologic: Alert and oriented x 3.  Psych: Normal affect. Extremities: No clubbing or cyanosis.  HEENT: Normal.   Assessment/Plan: 1. CAD: S/p anterior MI in 7/17 with DES to RCA.  Cath in 10/19 with no obstructive CAD.  No further chest pain.  - She is off Plavix now and taking apixaban 5 mg bid.   - She is tolerating Crestor without myalgias.  Good lipids in 10/21.       2. Chronic systolic CHF: Ischemic cardiomyopathy.  EF 25% on echo in 10/17, 30-35% on echo in 11/17, 30% on TEE in 8/18. She has extensive LAD-territory scar.  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  Echo today shows that EF remains 25-30% with  LAD-territory scar.  She is not volume overloaded on exam or by Corvue. NYHA class II symptoms.  Low BP makes adjustment of GDMT difficult.  - Off Coreg with low BP and addition of sotalol.   - I will try increasing losartan to 25 mg qhs.  BMET today and in 10 days.   - She did not tolerate dapagliflozin due to yeast infections.  - Continue Lasix 20 mg daily.    - Continue spironolactone 25 mg daily.   - QRS does not appear to be wide enough that she would have benefit from CRT.  3. Mitral regurgitation: Moderate to severe on 10/17 echo, severe on 11/17 echo.  I did a TEE in 8/18.  This showed moderate MR that appeared to be functional.  No indication for surgery or Mitraclip. Echo in 1/22 showed mild-moderate MR.  4. Atrial fibrillation: Unable to tolerate amiodarone due to GI side effects.  She is in NSR today on Eliquis and sotalol. QTc interval is acceptable on sotalol on today's ECG.    5. PVCs: Very frequent on 12/18 holter (41% beats), she has had a PVC ablation in 2/19. Due to recurrence of PVCs post-ablation, she is now on sotalol. She does not seem to have many PVCs.  - Continue sotalol, QTc ok on ECG today.  6. Hyperlipidemia: Tolerating Crestor, good lipids in 10/21. 7. Fe deficiency anemia: Associated with dark stool/melena, FOBT+.  Hgb down to 8 at one point, has had IV Fe and transfusion.  C-scope, capsule endoscopy, and EGD not revealing of definite cause.  Followup 4 months  Marca Ancona 06/19/2020

## 2020-06-19 NOTE — Patient Instructions (Addendum)
EKG done today.  Labs done today. We will contact you only if your labs are abnormal.  INCREASE Losartan to 25mg  (1 tablet) by mouth daily at bedtime.  No other medication changes were made. Please continue all current medications as prescribed.  Your physician recommends that you schedule a follow-up appointment in: 10 days for a lab only appointment and in 4 months with Dr. .   If you have any questions or concerns before your next appointment please send Shirlee Latch a message through [place] or call our office at [phone].    TO LEAVE A MESSAGE FOR THE NURSE SELECT OPTION 2, PLEASE LEAVE A MESSAGE INCLUDING: . YOUR NAME . DATE OF BIRTH . CALL BACK NUMBER . REASON FOR CALL**this is important as we prioritize the call backs  YOU WILL RECEIVE A CALL BACK THE SAME DAY AS LONG AS YOU CALL BEFORE 4:00 PM   Do the following things EVERYDAY: 1) Weigh yourself in the morning before breakfast. Write it down and keep it in a log. 2) Take your medicines as prescribed 3) Eat low salt foods--Limit salt (sodium) to 2000 mg per day.  4) Stay as active as you can everyday 5) Limit all fluids for the day to less than 2 liters   At the [facility], you and your health needs are our priority. As part of our continuing mission to provide you with exceptional heart care, we have created designated Provider Care Teams. These Care Teams include your primary Cardiologist (physician) and Advanced Practice Providers (APPs- Physician Assistants and Nurse Practitioners) who all work together to provide you with the care you need, when you need it.   You may see any of the following providers on your designated Care Team at your next follow up: [phone] Dr Marland Kitchen . Dr Arvilla Meres . Marca Ancona, NP . Tonye Becket, PA . Robbie Lis, PharmD   Please be sure to bring in all your medications bottles to every appointment.

## 2020-06-23 ENCOUNTER — Other Ambulatory Visit (HOSPITAL_COMMUNITY): Payer: Self-pay | Admitting: Cardiology

## 2020-06-30 ENCOUNTER — Other Ambulatory Visit: Payer: Self-pay

## 2020-06-30 ENCOUNTER — Ambulatory Visit
Admission: EM | Admit: 2020-06-30 | Discharge: 2020-06-30 | Disposition: A | Discharge status: Home / Self Care | Payer: BC Managed Care – PPO | Attending: Family Medicine | Admitting: Family Medicine

## 2020-06-30 ENCOUNTER — Encounter: Payer: Self-pay | Admitting: Emergency Medicine

## 2020-06-30 DIAGNOSIS — L2489 Irritant contact dermatitis due to other agents: Secondary | ICD-10-CM | POA: Diagnosis not present

## 2020-06-30 DIAGNOSIS — L299 Pruritus, unspecified: Secondary | ICD-10-CM

## 2020-06-30 MED ORDER — TRIAMCINOLONE ACETONIDE 0.1 % EX CREA: 1.0000 "application " | TOPICAL_CREAM | Freq: Two times a day (BID) | CUTANEOUS | 0 refills | Status: DC

## 2020-06-30 NOTE — ED Provider Notes (Signed)
[facility]    CSN: [id number] Arrival date & time: 06/30/20  1345      History   Chief Complaint No chief complaint on file.   HPI Catherine Mays is a 55 y.o. female.   HPI Patient presents today with itchy, pruritic rash thought to be related to exposure to poison ivy. The rash was began to spread and patient endorses the rash is worsening. She initially noticed the rash 6 days ago. Past Medical History:  Diagnosis Date  . AICD (automatic cardioverter/defibrillator) present    [employer]  . CAD in native artery    a. late recognition of presentation of STEMI 08/2015 s/p DES to LAD, mild residual mRCA.  Marland Kitchen Chronic systolic CHF (congestive heart failure) (HCC)   . CKD (chronic kidney disease), stage III (HCC)   . Hypotension    a. preventing med titration for HF.  Marland Kitchen Hypothyroidism 09/24/2015  . Ischemic cardiomyopathy   . Myocardial infarction (HCC) 08/2015  . PAF (paroxysmal atrial fibrillation) (HCC) 09/24/2015   a. dx at time of STEMI 08/2015.  . Pre-diabetes   . Presence of permanent cardiac pacemaker    patient has ICD  . PVC's (premature ventricular contractions)     Patient Active Problem List   Diagnosis Date Noted  . IDA (iron deficiency anemia) 05/23/2019  . GERD (gastroesophageal reflux disease) 05/23/2019  . Nausea without vomiting 05/23/2019  . Weakness 12/18/2017  . Sensation of chest pressure 12/17/2017  . Encounter for screening colonoscopy 10/19/2017  . PVC (premature ventricular contraction) 04/02/2017  . Mitral regurgitation 04/17/2016  . CHF (congestive heart failure) (HCC) 01/23/2016  . PVC's (premature ventricular contractions)   . Pre-diabetes   . Chronic systolic CHF (congestive heart failure) (HCC)   . Ischemic cardiomyopathy   . Hypotension   . CAD in native artery   . Hypothyroidism 09/24/2015  . PAF (paroxysmal atrial fibrillation) (HCC) 09/24/2015    Past Surgical History:  Procedure Laterality Date  . BIOPSY  06/16/2019    Procedure: BIOPSY;  Surgeon: Corbin Ade, MD;  Location: [facility];  Service: Endoscopy;;  . CARDIAC CATHETERIZATION N/A 09/20/2015   Procedure: Left Heart Cath and Coronary Angiography;  Surgeon: Kathleene Hazel, MD;  Location: [facility] INVASIVE CV LAB;  Service: Cardiovascular;  Laterality: N/A;  . CARDIAC CATHETERIZATION N/A 09/20/2015   Procedure: Coronary Stent Intervention;  Surgeon: Kathleene Hazel, MD;  Location: [facility] INVASIVE CV LAB;  Service: Cardiovascular;  Laterality: N/A;  . CARDIAC CATHETERIZATION N/A 09/21/2015   Procedure: Left Heart Cath and Coronary Angiography;  Surgeon: Tonny Bollman, MD;  Location: [facility] INVASIVE CV LAB;  Service: Cardiovascular;  Laterality: N/A;  . CARDIAC CATHETERIZATION N/A 11/26/2015   Procedure: Right Heart Cath;  Surgeon: [name], MD;  Location: [facility] INVASIVE CV LAB;  Service: Cardiovascular;  Laterality: N/A;  . COLONOSCOPY WITH PROPOFOL N/A 11/23/2017   Dr. Jena Gauss: Diverticulosis, internal hemorrhoids, next colonoscopy in 10 years  . COLONOSCOPY WITH PROPOFOL N/A 08/10/2019   Procedure: COLONOSCOPY WITH PROPOFOL;  Surgeon: Corbin Ade, MD;  Diverticulosis in sigmoid colon, nonbleeding internal hemorrhoids, otherwise normal exam.    . CORONARY STENT PLACEMENT  09/20/2015  . EP IMPLANTABLE DEVICE N/A 01/23/2016   Procedure: ICD Implant;  Surgeon: Will Jorja Loa, MD;  Location: [facility] INVASIVE CV LAB;  Service: Cardiovascular;  Laterality: N/A;  . ESOPHAGOGASTRODUODENOSCOPY (EGD) WITH PROPOFOL N/A 06/16/2019   Procedure: ESOPHAGOGASTRODUODENOSCOPY (EGD) WITH PROPOFOL;  Surgeon: Corbin Ade, MD;  Normal esophagus, small  hiatal hernia, normal examined stomach, normal examined duodenum.  Gastric biopsy with slight chronic inflammation, duodenal biopsy with slight intramucosal Brunner gland hyperplasia.  Marland Kitchen GIVENS CAPSULE STUDY N/A 11/23/2019    Surgeon: Corbin Ade, MD; essentially unremarkable except for tiny erosion in the  proximal small bowel.  Marland Kitchen LEFT HEART CATH AND CORONARY ANGIOGRAPHY N/A 12/18/2017   Procedure: LEFT HEART CATH AND CORONARY ANGIOGRAPHY;  Surgeon: [name], MD;  Location: [facility] INVASIVE CV LAB;  Service: Cardiovascular;  Laterality: N/A;  . PVC ABLATION N/A 04/02/2017   Procedure: PVC ABLATION;  Surgeon: Regan Lemming, MD;  Location: [facility] INVASIVE CV LAB;  Service: Cardiovascular;  Laterality: N/A;  . TEE WITHOUT CARDIOVERSION N/A 10/08/2016   Procedure: TRANSESOPHAGEAL ECHOCARDIOGRAM (TEE);  Surgeon: [name], MD;  Location: [facility] ENDOSCOPY;  Service: Cardiovascular;  Laterality: N/A;  . THYROIDECTOMY      OB History   No obstetric history on file.      Home Medications    Prior to Admission medications   Medication Sig Start Date End Date Taking? Authorizing Provider  apixaban (ELIQUIS) 5 MG TABS tablet Take 1 tablet (5 mg total) by mouth 2 (two) times daily. 03/23/20   [name], MD  buPROPion (WELLBUTRIN XL) 300 MG 24 hr tablet Take 300 mg by mouth daily. 05/10/19   [provider]  Calcium Carb-Cholecalciferol (CALCIUM + D3 PO) Take 1 tablet by mouth every morning.    [provider]  Coenzyme Q10 (COQ10) 200 MG CAPS Take 200 mg by mouth daily.    [provider]  dexlansoprazole (DEXILANT) 60 MG capsule Take 1 capsule (60 mg total) by mouth daily. 04/18/20   Letta Median, PA-C  fexofenadine (ALLEGRA) 180 MG tablet Take 180 mg by mouth daily.    [provider]  furosemide (LASIX) 20 MG tablet TAKE 1 TABLET(20 MG) BY MOUTH DAILY AS NEEDED 06/27/20   [name], MD  losartan (COZAAR) 25 MG tablet Take 1 tablet (25 mg total) by mouth at bedtime. 06/19/20   [name], MD  nitroGLYCERIN (NITROSTAT) 0.4 MG SL tablet PLACE 1 TABLET UNDER THE TONGUE EVERY 5 MINUTES AS NEEDED FOR CHEST PAIN 04/13/20   [name], MD  potassium chloride SA (KLOR-CON) 20 MEQ tablet Take 40 mEq by mouth every other day.  02/21/19    [provider]  rosuvastatin (CRESTOR) 10 MG tablet TAKE 1 TABLET(10 MG) BY MOUTH DAILY 05/15/20   [name], MD  sotalol (BETAPACE) 160 MG tablet TAKE 1 TABLET BY MOUTH EVERY MORNING AND 1/2 TABLET EVERY EVENING 03/19/20   Kathleene Hazel, MD  spironolactone (ALDACTONE) 25 MG tablet Take 1 tablet (25 mg total) by mouth daily. 09/22/19   [name], MD  thyroid (ARMOUR) 120 MG tablet Take 120 mg by mouth daily before breakfast.    [provider]  traZODone (DESYREL) 50 MG tablet Take 0.5 tablets (25 mg total) by mouth at bedtime as needed for sleep. 11/26/15   Clegg, Amy D, NP  triamcinolone (NASACORT) 55 MCG/ACT AERO nasal inhaler Place 2 sprays into the nose daily.     [provider]    Family History Family History  Problem Relation Age of Onset  . Heart attack Father   . Arrhythmia Mother   . Lung cancer Mother        bronchiectasis  . Colon cancer Neg Hx   . Colon polyps Neg Hx  Social History Social History   Tobacco Use  . Smoking status: Never Smoker  . Smokeless tobacco: Never Used  Vaping Use  . Vaping Use: Never used  Substance Use Topics  . Alcohol use: No  . Drug use: No     Allergies   Paxil [paroxetine], Morphine and related, Percocet [oxycodone-acetaminophen], Cefdinir, and Zolpidem   Review of Systems Review of Systems Pertinent negatives listed in HPI Physical Exam Triage Vital Signs ED Triage Vitals  Enc Vitals Group     BP 06/30/20 1510 106/67     Pulse Rate 06/30/20 1510 67     Resp 06/30/20 1510 17     Temp 06/30/20 1510 98.2 F (36.8 C)     Temp Source 06/30/20 1510 Oral     SpO2 06/30/20 1510 98 %     Weight --      Height --      Head Circumference --      Peak Flow --      Pain Score 06/30/20 1512 0     Pain Loc --      Pain Edu? --      Excl. in GC? --    No data found.  Updated Vital Signs BP 106/67 (BP Location: Right Arm)   Pulse 67   Temp 98.2 F (36.8 C) (Oral)   Resp  17   SpO2 98%   Visual Acuity Right Eye Distance:   Left Eye Distance:   Bilateral Distance:    Right Eye Near:   Left Eye Near:    Bilateral Near:     Physical Exam  General appearance: alert, well developed, well nourished, cooperative and in no distress Head: Normocephalic, without obvious abnormality, atraumatic Respiratory: Respirations even and unlabored, normal respiratory rate Heart: Rate and rhythm normal. No gallop or murmurs noted on exam  Extremities: No gross deformities Skin: Skin color, texture, turgor normal. Macular papular rash present  Psych: Appropriate mood and affect. UC Treatments / Results  Labs (all labs ordered are listed, but only abnormal results are displayed) Labs Reviewed - No data to display  EKG   Radiology No results found.  Procedures Procedures (including critical care time)  Medications Ordered in UC Medications - No data to display  Initial Impression / Assessment and Plan / UC Course  I have reviewed the triage vital signs and the nursing notes.  Pertinent labs & imaging results that were available during my care of the patient were reviewed by me and considered in my medical decision making (see chart for details).      Irritant dermatitis , Decadron 10 mg IM given in clinic. Triamcinolone cream apply to affected area as prescribed. Return if symptoms do not completely resolve Final Clinical Impressions(s) / UC Diagnoses   Final diagnoses:  Irritant contact dermatitis due to other agents  Ear itching     Discharge Instructions     Over the counter hydrocortisone cream for ear itching.    ED Prescriptions    Medication Sig Dispense Auth. Provider   triamcinolone cream (KENALOG) 0.1 % Apply 1 application topically 2 (two) times daily. 454 g Bing Neighbors, FNP     PDMP not reviewed this encounter.   Bing Neighbors, FNP 07/02/20 2244

## 2020-06-30 NOTE — ED Triage Notes (Signed)
Itchy rash to back of LT knee, RT hip and LT side of abd that started 1st of week.

## 2020-06-30 NOTE — Discharge Instructions (Addendum)
Over the counter hydrocortisone cream for ear itching.

## 2020-07-10 NOTE — Progress Notes (Signed)
Remote ICD transmission.   

## 2020-07-18 ENCOUNTER — Ambulatory Visit: Payer: BC Managed Care – PPO | Admitting: Gastroenterology

## 2020-07-20 ENCOUNTER — Inpatient Hospital Stay (HOSPITAL_COMMUNITY): Payer: BC Managed Care – PPO | Attending: Hematology

## 2020-07-20 ENCOUNTER — Other Ambulatory Visit: Payer: Self-pay

## 2020-07-20 ENCOUNTER — Other Ambulatory Visit (HOSPITAL_COMMUNITY)
Admission: RE | Admit: 2020-07-20 | Discharge: 2020-07-20 | Disposition: A | Discharge status: Home / Self Care | Payer: BC Managed Care – PPO | Source: Ambulatory Visit | Attending: Cardiology | Admitting: Cardiology

## 2020-07-20 DIAGNOSIS — D500 Iron deficiency anemia secondary to blood loss (chronic): Secondary | ICD-10-CM

## 2020-07-20 DIAGNOSIS — I5022 Chronic systolic (congestive) heart failure: Secondary | ICD-10-CM | POA: Diagnosis present

## 2020-07-20 DIAGNOSIS — D509 Iron deficiency anemia, unspecified: Secondary | ICD-10-CM | POA: Diagnosis present

## 2020-07-20 LAB — CBC WITH DIFFERENTIAL/PLATELET
Abs Immature Granulocytes: 0.01 10*3/uL (ref 0.00–0.07)
Basophils Absolute: 0 10*3/uL (ref 0.0–0.1)
Basophils Relative: 1 %
Eosinophils Absolute: 0.1 10*3/uL (ref 0.0–0.5)
Eosinophils Relative: 2 %
HCT: 36.5 % (ref 36.0–46.0)
Hemoglobin: 11.8 g/dL — ABNORMAL LOW (ref 12.0–15.0)
Immature Granulocytes: 0 %
Lymphocytes Relative: 33 %
Lymphs Abs: 1.4 10*3/uL (ref 0.7–4.0)
MCH: 31.1 pg (ref 26.0–34.0)
MCHC: 32.3 g/dL (ref 30.0–36.0)
MCV: 96.1 fL (ref 80.0–100.0)
Monocytes Absolute: 0.4 10*3/uL (ref 0.1–1.0)
Monocytes Relative: 10 %
Neutro Abs: 2.2 10*3/uL (ref 1.7–7.7)
Neutrophils Relative %: 54 %
Platelets: 146 10*3/uL — ABNORMAL LOW (ref 150–400)
RBC: 3.8 MIL/uL — ABNORMAL LOW (ref 3.87–5.11)
RDW: 12.8 % (ref 11.5–15.5)
WBC: 4.1 10*3/uL (ref 4.0–10.5)
nRBC: 0 % (ref 0.0–0.2)

## 2020-07-20 LAB — IRON AND TIBC
Iron: 77 ug/dL (ref 28–170)
Saturation Ratios: 21 % (ref 10.4–31.8)
TIBC: 366 ug/dL (ref 250–450)
UIBC: 289 ug/dL

## 2020-07-20 LAB — BASIC METABOLIC PANEL
Anion gap: 4 — ABNORMAL LOW (ref 5–15)
BUN: 27 mg/dL — ABNORMAL HIGH (ref 6–20)
CO2: 29 mmol/L (ref 22–32)
Calcium: 8.8 mg/dL — ABNORMAL LOW (ref 8.9–10.3)
Chloride: 103 mmol/L (ref 98–111)
Creatinine, Ser: 1.05 mg/dL — ABNORMAL HIGH (ref 0.44–1.00)
GFR, Estimated: >60 mL/min (ref >60)
Glucose, Bld: 115 mg/dL — ABNORMAL HIGH (ref 70–99)
Potassium: 5 mmol/L (ref 3.5–5.1)
Sodium: 136 mmol/L (ref 135–145)

## 2020-07-20 LAB — FERRITIN: Ferritin: 166 ng/mL (ref 11–307)

## 2020-07-30 ENCOUNTER — Ambulatory Visit (HOSPITAL_COMMUNITY): Payer: BC Managed Care – PPO | Admitting: Hematology

## 2020-08-27 NOTE — Progress Notes (Signed)
[facility] [address], [place] [address]   CLINIC:  Medical Oncology/Hematology  PCP:  Benita Stabile, MD [address] [name] [place] [address]  [phone]  REASON FOR VISIT:  Follow-up for IDA  PRIOR THERAPY: Intermittent Feraheme last on 05/24/2019  CURRENT THERAPY: Intermittent Venofer last on 03/09/2020  INTERVAL HISTORY:  Catherine Mays, a 55 y.o. female, returns for routine follow-up for her IDA. Catherine Mays was last seen by Dr. Ellin Saba on 04/19/2020.  Today she says she has been well in the interim since her last visit.  She notes that she had an appointment scheduled for late May early June but unfortunate that was postponed.  She reports that she has not been having any overt signs of bleeding, bruising, or dark stools.  She does endorse having gone through menopause in 2017.  Overall she says she "feels good, but not great".  He can vary on a day-to-day basis.  She is not having any shortness of breath and denies having any craving for ice.  Overall she feels considerably better than when her iron levels were low.   REVIEW OF SYSTEMS:  Review of Systems  Constitutional:  Positive for fatigue (40% & decreasing). Negative for appetite change.  Cardiovascular:  Positive for palpitations (occasional PVC's).  Gastrointestinal:  Negative for blood in stool and constipation.  Genitourinary:  Negative for hematuria.   Neurological:  Positive for headaches.  All other systems reviewed and are negative.  PAST MEDICAL/SURGICAL HISTORY:  Past Medical History:  Diagnosis Date   AICD (automatic cardioverter/defibrillator) present    St. Jude   CAD in native artery    a. late recognition of presentation of STEMI 08/2015 s/p DES to LAD, mild residual mRCA.   Chronic systolic CHF (congestive heart failure) (HCC)    CKD (chronic kidney disease), stage III (HCC)    Hypotension    a. preventing med titration for HF.   Hypothyroidism 09/24/2015    Ischemic cardiomyopathy    Myocardial infarction [facility]) 08/2015   PAF (paroxysmal atrial fibrillation) (HCC) 09/24/2015   a. dx at time of STEMI 08/2015.   Pre-diabetes    Presence of permanent cardiac pacemaker    patient has ICD   PVC's (premature ventricular contractions)    Past Surgical History:  Procedure Laterality Date   BIOPSY  06/16/2019   Procedure: BIOPSY;  Surgeon: Corbin Ade, MD;  Location: [facility];  Service: Endoscopy;;   CARDIAC CATHETERIZATION N/A 09/20/2015   Procedure: Left Heart Cath and Coronary Angiography;  Surgeon: Kathleene Hazel, MD;  Location: [facility] INVASIVE CV LAB;  Service: Cardiovascular;  Laterality: N/A;   CARDIAC CATHETERIZATION N/A 09/20/2015   Procedure: Coronary Stent Intervention;  Surgeon: Kathleene Hazel, MD;  Location: [facility] INVASIVE CV LAB;  Service: Cardiovascular;  Laterality: N/A;   CARDIAC CATHETERIZATION N/A 09/21/2015   Procedure: Left Heart Cath and Coronary Angiography;  Surgeon: Tonny Bollman, MD;  Location: [facility] INVASIVE CV LAB;  Service: Cardiovascular;  Laterality: N/A;   CARDIAC CATHETERIZATION N/A 11/26/2015   Procedure: Right Heart Cath;  Surgeon: [name], MD;  Location: [facility] INVASIVE CV LAB;  Service: Cardiovascular;  Laterality: N/A;   COLONOSCOPY WITH PROPOFOL N/A 11/23/2017   Dr. Jena Gauss: Diverticulosis, internal hemorrhoids, next colonoscopy in 10 years   COLONOSCOPY WITH PROPOFOL N/A 08/10/2019   Procedure: COLONOSCOPY WITH PROPOFOL;  Surgeon: Corbin Ade, MD;  Diverticulosis in sigmoid colon, nonbleeding internal hemorrhoids, otherwise normal exam.  CORONARY STENT PLACEMENT  09/20/2015   EP IMPLANTABLE DEVICE N/A 01/23/2016   Procedure: ICD Implant;  Surgeon: Will Jorja Loa, MD;  Location: [facility] INVASIVE CV LAB;  Service: Cardiovascular;  Laterality: N/A;   ESOPHAGOGASTRODUODENOSCOPY (EGD) WITH PROPOFOL N/A 06/16/2019   Procedure: ESOPHAGOGASTRODUODENOSCOPY (EGD) WITH PROPOFOL;  Surgeon: Corbin Ade,  MD;  Normal esophagus, small hiatal hernia, normal examined stomach, normal examined duodenum.  Gastric biopsy with slight chronic inflammation, duodenal biopsy with slight intramucosal Brunner gland hyperplasia.   GIVENS CAPSULE STUDY N/A 11/23/2019    Surgeon: Corbin Ade, MD; essentially unremarkable except for tiny erosion in the proximal small bowel.   LEFT HEART CATH AND CORONARY ANGIOGRAPHY N/A 12/18/2017   Procedure: LEFT HEART CATH AND CORONARY ANGIOGRAPHY;  Surgeon: [name], MD;  Location: [facility] INVASIVE CV LAB;  Service: Cardiovascular;  Laterality: N/A;   PVC ABLATION N/A 04/02/2017   Procedure: PVC ABLATION;  Surgeon: Regan Lemming, MD;  Location: [facility] INVASIVE CV LAB;  Service: Cardiovascular;  Laterality: N/A;   TEE WITHOUT CARDIOVERSION N/A 10/08/2016   Procedure: TRANSESOPHAGEAL ECHOCARDIOGRAM (TEE);  Surgeon: [name], MD;  Location: [facility] ENDOSCOPY;  Service: Cardiovascular;  Laterality: N/A;   THYROIDECTOMY      SOCIAL HISTORY:  Social History   Socioeconomic History   Marital status: Married    Spouse name: Not on file   Number of children: Not on file   Years of education: Not on file   Highest education level: Not on file  Occupational History   Not on file  Tobacco Use   Smoking status: Never   Smokeless tobacco: Never  Vaping Use   Vaping Use: Never used  Substance and Sexual Activity   Alcohol use: No   Drug use: No   Sexual activity: Not on file  Other Topics Concern   Not on file  Social History Narrative   Not on file   Social Determinants of Health   Financial Resource Strain: Low Risk    Difficulty of Paying Living Expenses: Not hard at all  Food Insecurity: No Food Insecurity   Worried About Running Out of Food in the Last Year: Never true   Ran Out of Food in the Last Year: Never true  Transportation Needs: No Transportation Needs   Lack of Transportation (Medical): No   Lack of Transportation (Non-Medical): No  Physical  Activity: Insufficiently Active   Days of Exercise per Week: 3 days   Minutes of Exercise per Session: 30 min  Stress: No Stress Concern Present   Feeling of Stress : Not at all  Social Connections: Socially Integrated   Frequency of Communication with Friends and Family: More than three times a week   Frequency of Social Gatherings with Friends and Family: More than three times a week   Attends Religious Services: More than 4 times per year   Active Member of [employer] or Organizations: Yes   Attends Engineer, structural: More than 4 times per year   Marital Status: Married  Catering manager Violence: Not At Risk   Fear of Current or Ex-Partner: No   Emotionally Abused: No   Physically Abused: No   Sexually Abused: No    FAMILY HISTORY:  Family History  Problem Relation Age of Onset   Heart attack Father    Arrhythmia Mother    Lung cancer Mother        bronchiectasis   Colon cancer Neg Hx    Colon polyps Neg Hx  CURRENT MEDICATIONS:  Current Outpatient Medications  Medication Sig Dispense Refill   apixaban (ELIQUIS) 5 MG TABS tablet Take 1 tablet (5 mg total) by mouth 2 (two) times daily. 180 tablet 3   buPROPion (WELLBUTRIN XL) 300 MG 24 hr tablet Take 300 mg by mouth daily.     Calcium Carb-Cholecalciferol (CALCIUM + D3 PO) Take 1 tablet by mouth every morning.     Coenzyme Q10 (COQ10) 200 MG CAPS Take 200 mg by mouth daily.     dexlansoprazole (DEXILANT) 60 MG capsule Take 1 capsule (60 mg total) by mouth daily. 90 capsule 3   fexofenadine (ALLEGRA) 180 MG tablet Take 180 mg by mouth daily.     furosemide (LASIX) 20 MG tablet TAKE 1 TABLET(20 MG) BY MOUTH DAILY AS NEEDED 30 tablet 6   losartan (COZAAR) 25 MG tablet Take 1 tablet (25 mg total) by mouth at bedtime. 90 tablet 3   potassium chloride SA (KLOR-CON) 20 MEQ tablet Take 40 mEq by mouth every other day.      rosuvastatin (CRESTOR) 10 MG tablet TAKE 1 TABLET(10 MG) BY MOUTH DAILY 90 tablet 3   sotalol  (BETAPACE) 160 MG tablet TAKE 1 TABLET BY MOUTH EVERY MORNING AND 1/2 TABLET EVERY EVENING 45 tablet 6   spironolactone (ALDACTONE) 25 MG tablet Take 1 tablet (25 mg total) by mouth daily. 30 tablet 11   thyroid (ARMOUR) 120 MG tablet Take 120 mg by mouth daily before breakfast.     traZODone (DESYREL) 50 MG tablet Take 0.5 tablets (25 mg total) by mouth at bedtime as needed for sleep. 30 tablet 0   triamcinolone (NASACORT) 55 MCG/ACT AERO nasal inhaler Place 2 sprays into the nose daily.      triamcinolone cream (KENALOG) 0.1 % Apply 1 application topically 2 (two) times daily. 454 g 0   nitroGLYCERIN (NITROSTAT) 0.4 MG SL tablet PLACE 1 TABLET UNDER THE TONGUE EVERY 5 MINUTES AS NEEDED FOR CHEST PAIN (Patient not taking: Reported on 08/28/2020) 25 tablet 3   No current facility-administered medications for this visit.   Facility-Administered Medications Ordered in Other Visits  Medication Dose Route Frequency Provider Last Rate Last Admin   bivalirudin (ANGIOMAX) 250 mg in sodium chloride 0.9 % 50 mL (5 mg/mL) infusion    Continuous PRN Kathleene HazelMcAlhany, Christopher D, MD   Stopped at 09/20/15 0733   bivalirudin (ANGIOMAX) BOLUS via infusion    PRN Kathleene HazelMcAlhany, Christopher D, MD   54.45 mg at 09/20/15 [id number]   fentaNYL (SUBLIMAZE) injection    PRN Kathleene HazelMcAlhany, Christopher D, MD   25 mcg at 09/20/15 [id number]   heparin 1,500 mL    PRN Kathleene HazelMcAlhany, Christopher D, MD   Given at 09/20/15 0657   heparin infusion 2 units/mL in 0.9 % sodium chloride    Continuous PRN Verne CarrowMcAlhany, Christopher D, MD   1,500 mL at 09/20/15 0733   iopamidol (ISOVUE-370) 76 % injection    PRN Kathleene HazelMcAlhany, Christopher D, MD   165 mL at 09/20/15 0733   lidocaine (PF) (XYLOCAINE) 1 % injection    PRN Kathleene HazelMcAlhany, Christopher D, MD   2 mL at 09/20/15 [id number]   midazolam (VERSED) injection    PRN Kathleene HazelMcAlhany, Christopher D, MD   1 mg at 09/20/15 [id number]   nitroGLYCERIN 1 mg/10 ml (100 mcg/ml) - IR/CATH LAB    PRN Kathleene HazelMcAlhany, Christopher D, MD   200 mcg at 09/20/15 [id number]    Radial Cocktail/Verapamil only    PRN Kathleene HazelMcAlhany, Christopher D, MD   10  mL at 09/20/15 3545   ticagrelor (BRILINTA) tablet    PRN Kathleene Hazel, MD   180 mg at 09/20/15 6256   tirofiban (AGGRASTAT) bolus via infusion    PRN Kathleene Hazel, MD   1,815 mcg at 09/20/15 3893   tirofiban (AGGRASTAT) infusion 50 mcg/mL 100 mL    Continuous PRN Kathleene Hazel, MD 13.1 mL/hr at 09/20/15 0651 0.15 mcg/kg/min at 09/20/15 7342    ALLERGIES:  Allergies  Allergen Reactions   Paxil [Paroxetine] Palpitations   Morphine And Related Nausea And Vomiting   Percocet [Oxycodone-Acetaminophen] Nausea And Vomiting   Cefdinir Nausea Only   Zolpidem Other (See Comments)    Doesn't work for patient at all    PHYSICAL EXAM:  Performance status (ECOG): 1 - Symptomatic but completely ambulatory  Vitals:   08/28/20 0804  BP: 102/69  Pulse: 66  Resp: 18  Temp: (!) 96.6 F (35.9 C)  SpO2: 100%   Wt Readings from Last 3 Encounters:  08/28/20 153 lb 3.2 oz (69.5 kg)  06/19/20 149 lb (67.6 kg)  04/19/20 152 lb (68.9 kg)   Physical Exam Vitals reviewed.  Constitutional:      Appearance: Normal appearance.  Cardiovascular:     Rate and Rhythm: Normal rate and regular rhythm.     Pulses: Normal pulses.     Heart sounds: Normal heart sounds.  Pulmonary:     Effort: Pulmonary effort is normal.     Breath sounds: Normal breath sounds.  Neurological:     General: No focal deficit present.     Mental Status: She is alert and oriented to person, place, and time.  Psychiatric:        Mood and Affect: Mood normal.        Behavior: Behavior normal.    LABORATORY DATA:  I have reviewed the labs as listed.  CBC Latest Ref Rng & Units 07/20/2020 04/19/2020 02/10/2020  WBC 4.0 - 10.5 K/uL 4.1 4.4 4.1  Hemoglobin 12.0 - 15.0 g/dL 11.8(L) 11.6(L) 10.1(L)  Hematocrit 36.0 - 46.0 % 36.5 37.0 30.8(L)  Platelets 150 - 400 K/uL 146(L) 143(L) 198   CMP Latest Ref Rng & Units 07/20/2020  06/19/2020 03/23/2020  Glucose 70 - 99 mg/dL 876(O) 83 98  BUN 6 - 20 mg/dL 11(X) 72(I) 20(B)  Creatinine 0.44 - 1.00 mg/dL 5.59(R) 4.16 3.84  Sodium 135 - 145 mmol/L 136 137 140  Potassium 3.5 - 5.1 mmol/L 5.0 4.6 4.9  Chloride 98 - 111 mmol/L 103 103 107  CO2 22 - 32 mmol/L 29 28 27   Calcium 8.9 - 10.3 mg/dL ) 8.9 9.1  Total Protein 6.5 - 8.1 g/dL - - -  Total Bilirubin 0.3 - 1.2 mg/dL - - -  Alkaline Phos 38 - 126 U/L - - -  AST 15 - 41 U/L - - -  ALT 0 - 44 U/L - - -      Component Value Date/Time   RBC 3.80 (L) 07/20/2020 0845   MCV 96.1 07/20/2020 0845   MCV 92 03/24/2017 1313   MCH 31.1 07/20/2020 0845   MCHC 32.3 07/20/2020 0845   RDW 12.8 07/20/2020 0845   RDW 13.8 03/24/2017 1313   LYMPHSABS 1.4 07/20/2020 0845   LYMPHSABS 2.0 03/24/2017 1313   MONOABS 0.4 07/20/2020 0845   EOSABS 0.1 07/20/2020 0845   EOSABS 0.2 03/24/2017 1313   BASOSABS 0.0 07/20/2020 0845   BASOSABS 0.0 03/24/2017 1313   Lab Results  Component Value Date  TOTALPROTELP 6.6 04/19/2020   ALBUMINELP 3.7 02/10/2020   A1GS 0.3 02/10/2020   A2GS 0.7 02/10/2020   BETS 0.9 02/10/2020   GAMS 0.8 02/10/2020   MSPIKE Not Observed 02/10/2020   SPEI Comment 02/10/2020    Lab Results  Component Value Date   KPAFRELGTCHN 20.5 (H) 04/16/2020   LAMBDASER 13.7 04/16/2020   KAPLAMBRATIO 1.50 04/16/2020    DIAGNOSTIC IMAGING:  I have independently reviewed the scans and discussed with the patient. No results found.    ASSESSMENT:  1.  Iron deficiency anemia: -Colonoscopy on 08/10/2019 shows diverticulosis in the sigmoid colon, nonbleeding internal hemorrhoids. -EGD on 06/16/2019 with normal esophagus and small hiatal hernia.  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 -Fecal occult blood test positive in May 2021. -Capsule study in September 2021 essentially unremarkable except for a tiny erosion in the proximal small  bowel. -Etiology includes chronic bleeding from anticoagulation (Eliquis) and possibly some malabsorption. -Treated with 5 infusions of Venofer from 02/27/2020 to 03/09/2020.  2.  CAD: -Status post anterior MI in 7/17 with stents to RCA with. -She has chronic systolic CHF (ischemic cardiomyopathy) with EF 25% - 30%, status post AICD. -Atrial fibrillation on Eliquis.   PLAN:  1.  Iron deficiency anemia: -She felt very well after completion of Venofer on 03/09/2020. -she feels well overall.  -Labs today shows ferritin 166 saturation 21%.  Hemoglobin is 11.8. -Other nutritional deficiency work-up was negative. -Recommend Venofer if patient were to have drop in ferritin.  -RTC 3 months with repeat CBC, ferritin and iron panel.  2.  Elevated kappa light chains: -Free light chains show slightly elevated kappa light chain 20.5 with normal lambda light chains 13.7 and ratio 1.5 on 04/16/2020.  -SPEP is negative. -Immunofixation shows no monoclonal protein (04/19/2020)    Orders placed this encounter:  No orders of the defined types were placed in this encounter.  Ulysees Barns, MD Department of Hematology/Oncology [facility] at [facility] Phone: [phone] Pager: [phone] Email: [email or web address]

## 2020-08-28 ENCOUNTER — Other Ambulatory Visit: Payer: Self-pay

## 2020-08-28 ENCOUNTER — Inpatient Hospital Stay (HOSPITAL_COMMUNITY): Payer: BC Managed Care – PPO | Attending: Hematology and Oncology | Admitting: Hematology and Oncology

## 2020-08-28 VITALS — BP 102/69 | HR 66 | Temp 96.6°F | Resp 18 | Wt 153.2 lb

## 2020-08-28 DIAGNOSIS — D509 Iron deficiency anemia, unspecified: Secondary | ICD-10-CM | POA: Insufficient documentation

## 2020-08-28 DIAGNOSIS — Z79899 Other long term (current) drug therapy: Secondary | ICD-10-CM | POA: Diagnosis not present

## 2020-08-28 DIAGNOSIS — Z7901 Long term (current) use of anticoagulants: Secondary | ICD-10-CM | POA: Diagnosis not present

## 2020-08-28 DIAGNOSIS — D500 Iron deficiency anemia secondary to blood loss (chronic): Secondary | ICD-10-CM | POA: Diagnosis not present

## 2020-08-28 DIAGNOSIS — E039 Hypothyroidism, unspecified: Secondary | ICD-10-CM | POA: Insufficient documentation

## 2020-08-28 DIAGNOSIS — D649 Anemia, unspecified: Secondary | ICD-10-CM | POA: Diagnosis not present

## 2020-08-28 DIAGNOSIS — N183 Chronic kidney disease, stage 3 unspecified: Secondary | ICD-10-CM | POA: Insufficient documentation

## 2020-08-29 ENCOUNTER — Other Ambulatory Visit (HOSPITAL_COMMUNITY): Payer: Self-pay

## 2020-08-29 DIAGNOSIS — D500 Iron deficiency anemia secondary to blood loss (chronic): Secondary | ICD-10-CM

## 2020-08-29 DIAGNOSIS — D649 Anemia, unspecified: Secondary | ICD-10-CM

## 2020-09-18 ENCOUNTER — Ambulatory Visit (INDEPENDENT_AMBULATORY_CARE_PROVIDER_SITE_OTHER): Payer: BC Managed Care – PPO

## 2020-09-18 DIAGNOSIS — I255 Ischemic cardiomyopathy: Secondary | ICD-10-CM

## 2020-09-18 LAB — CUP PACEART REMOTE DEVICE CHECK
Battery Remaining Longevity: 62 mo
Battery Remaining Percentage: 61 %
Battery Voltage: 2.95 V
Brady Statistic RV Percent Paced: 1 %
Date Time Interrogation Session: 20220726023957
HighPow Impedance: 80 Ohm
HighPow Impedance: 80 Ohm
Implantable Lead Implant Date: 20171129
Implantable Lead Location: 753860
Implantable Pulse Generator Implant Date: 20171129
Lead Channel Impedance Value: 440 Ohm
Lead Channel Pacing Threshold Amplitude: 1.5 V
Lead Channel Pacing Threshold Pulse Width: 0.5 ms
Lead Channel Sensing Intrinsic Amplitude: 12 mV
Lead Channel Setting Pacing Amplitude: 2.5 V
Lead Channel Setting Pacing Pulse Width: 0.5 ms
Lead Channel Setting Sensing Sensitivity: 0.5 mV
Pulse Gen Serial Number: 7377983

## 2020-10-02 ENCOUNTER — Telehealth: Payer: Self-pay

## 2020-10-02 NOTE — Telephone Encounter (Signed)
Spoke with pt.  She describes a couple of episodes occurring since Thursday where her chest felt heavy.  She reports having to rbeath shallowly due to inability to breath deep.  Issue is not currently occurring.  She also notes that her heart rate has been irregular.  Pt unable to send manual transmission at this time.  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  Advised we will look for transmission to come though tomorrow and can call her to review report.

## 2020-10-02 NOTE — Telephone Encounter (Signed)
Patient states it feels like a heaviness on top of her breasts, she states she cant take a deep breath. Patient states she may be in afib since her pulse beat is off and then she feels tired after with it lasting about 30 mins. Patient is not home to send a transmission and she will send one as soon as she gets home.

## 2020-10-03 NOTE — Telephone Encounter (Signed)
I let the patient know per nurse notes her transmission was normal with no episodes logged. The patient verbalized understanding and thanked me for my help.

## 2020-10-03 NOTE — Telephone Encounter (Signed)
Attempt made to contact patient in regard to transmission received and reviewed. Normal device function. No episodes logged.   No answer, LMTCB.

## 2020-10-05 NOTE — Telephone Encounter (Signed)
Called pt and let her know Dr. Clifton James would not make any changes at this time. She is aware if she has recurrent symptoms we can plan an evaluation.    She is in agreement with this plan.  Has follow ups arranged w HF clinic and EP.  Scheduled her 1 year f/u with Dr. Clifton James.

## 2020-10-05 NOTE — Telephone Encounter (Signed)
Following up with patient.  Patient reports over the past weekend (last Thursday 09/27/20 @ 3:30 and ended Sunday 09/30/20 evening hours, intermittent) she experienced chest heaviness (narrow band across breast area). Reports during event, did have some shortness of breath at times when walking around (nothing concerning). Also reports of  jaw pain intermittently over the past several weeks (last dentist visit 09/20/20).   Reports compliance with Lasix 20 mg daily, losartan 25 mg at bedtime, Eliquis 5 mg BID, sotalol 160 mg 1 tablet in am and 1/2 tablet at night.   Advised patient I will forward to Dr. Clifton James considering device interrogation appears normal. Patient appreciative of call. Discussed ED precautions with patient with verbal understanding.

## 2020-10-09 ENCOUNTER — Other Ambulatory Visit (HOSPITAL_COMMUNITY): Payer: Self-pay | Admitting: Cardiovascular Disease

## 2020-10-15 NOTE — Progress Notes (Signed)
Remote ICD transmission.   

## 2020-10-17 ENCOUNTER — Inpatient Hospital Stay (HOSPITAL_COMMUNITY): Payer: BC Managed Care – PPO

## 2020-10-19 ENCOUNTER — Other Ambulatory Visit: Payer: Self-pay

## 2020-10-19 ENCOUNTER — Encounter (HOSPITAL_COMMUNITY): Payer: Self-pay | Admitting: Cardiology

## 2020-10-19 ENCOUNTER — Other Ambulatory Visit (HOSPITAL_COMMUNITY): Payer: Self-pay

## 2020-10-19 ENCOUNTER — Encounter: Payer: Self-pay | Admitting: *Deleted

## 2020-10-19 ENCOUNTER — Encounter (HOSPITAL_COMMUNITY): Payer: Self-pay | Admitting: Hematology

## 2020-10-19 ENCOUNTER — Ambulatory Visit (HOSPITAL_COMMUNITY)
Admission: RE | Admit: 2020-10-19 | Discharge: 2020-10-19 | Disposition: A | Discharge status: Home / Self Care | Payer: BC Managed Care – PPO | Source: Ambulatory Visit | Attending: Cardiology | Admitting: Cardiology

## 2020-10-19 VITALS — BP 104/70 | Temp 99.0°F | Wt 149.6 lb

## 2020-10-19 DIAGNOSIS — Z006 Encounter for examination for normal comparison and control in clinical research program: Secondary | ICD-10-CM

## 2020-10-19 DIAGNOSIS — Z95810 Presence of automatic (implantable) cardiac defibrillator: Secondary | ICD-10-CM | POA: Diagnosis not present

## 2020-10-19 DIAGNOSIS — I5022 Chronic systolic (congestive) heart failure: Secondary | ICD-10-CM | POA: Insufficient documentation

## 2020-10-19 DIAGNOSIS — I252 Old myocardial infarction: Secondary | ICD-10-CM | POA: Insufficient documentation

## 2020-10-19 DIAGNOSIS — I454 Nonspecific intraventricular block: Secondary | ICD-10-CM | POA: Insufficient documentation

## 2020-10-19 DIAGNOSIS — Z7901 Long term (current) use of anticoagulants: Secondary | ICD-10-CM | POA: Diagnosis not present

## 2020-10-19 DIAGNOSIS — I2510 Atherosclerotic heart disease of native coronary artery without angina pectoris: Secondary | ICD-10-CM | POA: Insufficient documentation

## 2020-10-19 DIAGNOSIS — I255 Ischemic cardiomyopathy: Secondary | ICD-10-CM | POA: Insufficient documentation

## 2020-10-19 DIAGNOSIS — Z79899 Other long term (current) drug therapy: Secondary | ICD-10-CM | POA: Diagnosis not present

## 2020-10-19 DIAGNOSIS — I480 Paroxysmal atrial fibrillation: Secondary | ICD-10-CM | POA: Insufficient documentation

## 2020-10-19 DIAGNOSIS — Z955 Presence of coronary angioplasty implant and graft: Secondary | ICD-10-CM | POA: Diagnosis not present

## 2020-10-19 DIAGNOSIS — E785 Hyperlipidemia, unspecified: Secondary | ICD-10-CM | POA: Insufficient documentation

## 2020-10-19 DIAGNOSIS — Z8249 Family history of ischemic heart disease and other diseases of the circulatory system: Secondary | ICD-10-CM | POA: Insufficient documentation

## 2020-10-19 DIAGNOSIS — I340 Nonrheumatic mitral (valve) insufficiency: Secondary | ICD-10-CM | POA: Diagnosis not present

## 2020-10-19 LAB — BASIC METABOLIC PANEL
Anion gap: 7 (ref 5–15)
BUN: 29 mg/dL — ABNORMAL HIGH (ref 6–20)
CO2: 28 mmol/L (ref 22–32)
Calcium: 9.2 mg/dL (ref 8.9–10.3)
Chloride: 102 mmol/L (ref 98–111)
Creatinine, Ser: 1.1 mg/dL — ABNORMAL HIGH (ref 0.44–1.00)
GFR, Estimated: 60 mL/min — ABNORMAL LOW (ref >60)
Glucose, Bld: 101 mg/dL — ABNORMAL HIGH (ref 70–99)
Potassium: 4.4 mmol/L (ref 3.5–5.1)
Sodium: 137 mmol/L (ref 135–145)

## 2020-10-19 LAB — BRAIN NATRIURETIC PEPTIDE: B Natriuretic Peptide: 219.9 pg/mL — ABNORMAL HIGH (ref 0.0–100.0)

## 2020-10-19 MED ORDER — ENTRESTO 24-26 MG PO TABS: 1.0000 | ORAL_TABLET | Freq: Two times a day (BID) | ORAL | 11 refills | Status: DC

## 2020-10-19 NOTE — Research (Signed)
Patient here to day to see Dr Shirlee Latch is interested in the [facility] research study.  Review all with patient about the study.  Gave patient information to the patient.  Patient to see Dr Shirlee Latch in 6 weeks and lab work in 2. Dr Shirlee Latch is starting her on Entresto.  We will see patient back when she comes to see him in clinic.  Thanked patient for taking the time to listen about the study.

## 2020-10-19 NOTE — Patient Instructions (Addendum)
EKG done today.  Labs done today. We will contact you only if your labs are abnormal.  STOP taking Losartan  STOP taking Potassium  STOP taking Lasix  START Entresto 24-26mg  (1 tablet) by mouth 2 times daily.   No other medication changes were made. Please continue all current medications as prescribed.  Your physician recommends that you schedule a follow-up appointment in: 10 days for a lab only appointment in [place] and in 6 weeks with Dr. Shirlee Latch  If you have any questions or concerns before your next appointment please send [place] a message through [place] or call our office at [phone].    TO LEAVE A MESSAGE FOR THE NURSE SELECT OPTION 2, PLEASE LEAVE A MESSAGE INCLUDING: YOUR NAME DATE OF BIRTH CALL BACK NUMBER REASON FOR CALL**this is important as we prioritize the call backs  YOU WILL RECEIVE A CALL BACK THE SAME DAY AS LONG AS YOU CALL BEFORE 4:00 PM   Do the following things EVERYDAY: Weigh yourself in the morning before breakfast. Write it down and keep it in a log. Take your medicines as prescribed Eat low salt foods--Limit salt (sodium) to 2000 mg per day.  Stay as active as you can everyday Limit all fluids for the day to less than 2 liters   At the [facility], you and your health needs are our priority. As part of our continuing mission to provide you with exceptional heart care, we have created designated Provider Care Teams. These Care Teams include your primary Cardiologist (physician) and Advanced Practice Providers (APPs- Physician Assistants and Nurse Practitioners) who all work together to provide you with the care you need, when you need it.   You may see any of the following providers on your designated Care Team at your next follow up: Dr Arvilla Meres Dr Carron Curie, NP Robbie Lis, [place] Karle Plumber, PharmD   Please be sure to bring in all your medications bottles to every appointment.

## 2020-10-20 NOTE — Progress Notes (Signed)
PCP: Dr. Margo Aye HF Cardiology: Dr. Shirlee Latch  55 y.o. with history of CAD s/p anterior STEMI with DES to LAD, ischemic cardiomyopathy, mitral regurgitation, and paroxysmal atrial fibrillation presents for followup of CHF and CAD.  She was admitted in 7/17 with anterior STEMI.  She had a late presentation to the cath lab.  Echo in 10/17 showed EF about 25% with LAD-territory wall motion abnormalities.  She had peri-MI atrial fibrillation and was started on Xarelto.  She was put on amiodarone.   After discharge, she continued to feel poorly.  She developed nausea, anorexia, and significant fatigue.  She was admitted for evaluation in 10/17 given concern for low output heart failure.  However, stopping amiodarone essentially resolved her symptoms.  She had RHC showing preserved cardiac output but the PCWP was high due to prominent v-waves, presumably from significant mitral regurgitation.  Of note, she was in atrial fibrillation for a time during this admission.   She had [employer] ICD placed.  She is back at work for the [employer].   TEE in 8/18 to evaluate the mitral valve showed EF 30%, moderate MR (probably functional).    Patient was seen by Dr. Elberta Fortis and noted to have very frequent PVCs on device interrogation.  Holter in 12/18 showed 41% PVCs.  She was started on sotalol 80 mg bid and titrated up to 120 mg bid.  Coreg was stopped with the addition of sotalol and losartan was decreased to 12.5 mg daily due to low BP.  She had PVC ablation in 2/19 but PVCs recurred. Sotalol was increased to 160 mg bid.   Echo 8/19 showed EF 30% with regional wall motion abnormalities, normal RV size and systolic function, moderate MR.   She was admitted in 10/19 with chest pain.  Cath showed patent LAD stent and no obstructive CAD.   I started her on Farxiga but she had to stop it after developing a chronic yeast infection.   She was found to be anemic with Fe deficiency (hgb down to 8).  FOBT+. She has  had IV Fe and blood transfusion.  EGD and colonoscopy this year were fairly unremarkable.  She had diverticulosis.  Capsule endoscopy negative in 9/21.   Echo in 1/22 showed EF 25-30%, wall motion abnormalities in LAD distribution, mild-moderate MR, normal RV.   She returns for followup of CHF.  Weight is stable.  BP ok today, she gets lightheaded if she bends over then stands up too fast. No dyspnea walking on flat ground but she does get short of breath walking up hills and stairs.  She has significant generalized fatigue.  No orthopnea/PND.  She had an episode of chest heaviness centrally about 3-4 weeks ago.  This coincided with a period of high stress for her.  No chest pain since that time.   Labs (10/17): K 3.9, creatinine 1.24, hgb 10.3 Labs (11/17): K 4, creatinine 1.27 Labs (12/17): K 4.2, creatinine 1.12, BNP 439 Labs (2/18): K 4.7, creatinine 1.37 Labs (7/18): K 3.9, creatinine 1.14, hgb 11.7 Labs (12/18): K 4.5, creatinine 1.23 Labs (1/19): K 4.4, creatinine 1.14 Labs (2/19): LDL 97 (off atorvastatin), HDL 56 Labs (5/19): K 4.3, creatinine 1.1 Labs (10/19): K 4.2, creatinine 1.2, LDL 70, hgb 11.8 Labs (11/19): K 4.9, creatinine 1.17, hgb 11.8, LDL 68, low TSH  Labs (9/20): K 4.4, creatinine 1.34, LDL 52 Labs (12/20): K 4.6, creatinine 1.19 Labs (6/21): LDL 73, HDL 62, hgb 11.9, K 4.2, creatinine 1.14 Labs (7/21):  hgb 11.2, K 3.8, creatinine 1.12 Labs (10/21): K 4.5, creatinine 1.38, LDL 53, HDL 50 Labs (1/22): K 4.9, creatinine 0.99 Labs (2/22): hgb 11.6, Fe studies normal Labs (5/22): K 5, creatinine 1.05, hgb 11.8  ECG (personally reviewed): NSR, IVCD 134 msec, QTc 401  PMH:  1. CAD: Anterior STEMI in 7/17 with late presentation to the cath lab. She had DES to proximal LAD.  No significant disease in other vessels.  - LHC (10/19): Patent LAD stent, no obstructive disease.  2. Chronic systolic CHF: Ischemic cardiomyopathy.  - Echo 10/17 with EF 25%, akinesis of the  mid-apical anteroseptal and anterior walls, apical dyskinesis, moderate to severe MR with incomplete coaptation.  - RHC (10/17): mean RA 4, PA 59/17 mean 38, mean PCWP 27 with prominent V waves suggesting significant MR, CI 3.06, PVR 2 WU.  - ACEI cough.  - Echo (11/17): EF 30-35%, normal RV size and systolic function, severe MR with restriction of posterior leaflet.   - Echo (8/19): EF 30% with regional wall motion abnormalities, normal RV size and systolic function, moderate MR.  - Echo (12/20): EF 30%, LAD wall motion abnormalities, normal RV, PASP 35, moderate functional MR.  - Echo (1/22): EF 25-30%, wall motion abnormalities in LAD distribution, mild-moderate MR, normal RV.  3. Atrial fibrillation: Paroxysmal.  Noted 7/17 admission peri-MI, also noted again 10/17 admission. She did not tolerate amiodarone due to nausea/anorexia.  4. Mitral regurgitation: Moderate to severe on 10/17 echo, incomplete coaptation.  ?Etiology, her MI (anterior) does not typically lead to ischemic MR. - Echo in 11/17 with severe MR, posterior leaflet restricted.  - TEE (8/18): EF 30%, moderately dilated LV with akinetic septal and anterior walls, moderate functional MR, normal RV size and systolic function.  - Echo (8/19): Moderate MR 5. Hyperlipidemia: Myalgias with atorvastatin.  6. PVCs: Holter 12/18 with 41% PVCs.  - PVC ablation in 2/19 with recurrence of PVCs.  7. GI bleeding:  - EGD (4/21): unremarkable.  - Colonoscopy (7/21): Diverticulosis.  - Capsule endoscopy (9/21): negative.   SH: Married, lives in [place], works for the [employer], no smoking or ETOH.   Family History  Problem Relation Age of Onset   Heart attack Father    Arrhythmia Mother    Lung cancer Mother        bronchiectasis   Colon cancer Neg Hx    Colon polyps Neg Hx    ROS: All systems reviewed and negative except as per HPI.   Current Outpatient Medications on File Prior to Encounter  Medication Sig Dispense Refill   apixaban  (ELIQUIS) 5 MG TABS tablet Take 1 tablet (5 mg total) by mouth 2 (two) times daily. 180 tablet 3   buPROPion (WELLBUTRIN XL) 300 MG 24 hr tablet Take 300 mg by mouth daily.     Calcium Carb-Cholecalciferol (CALCIUM + D3 PO) Take 1 tablet by mouth every morning.     Coenzyme Q10 (COQ10) 200 MG CAPS Take 200 mg by mouth daily.     dexlansoprazole (DEXILANT) 60 MG capsule Take 1 capsule (60 mg total) by mouth daily. 90 capsule 3   fexofenadine (ALLEGRA) 180 MG tablet Take 180 mg by mouth daily.     nitroGLYCERIN (NITROSTAT) 0.4 MG SL tablet PLACE 1 TABLET UNDER THE TONGUE EVERY 5 MINUTES AS NEEDED FOR CHEST PAIN 25 tablet 3   rosuvastatin (CRESTOR) 10 MG tablet TAKE 1 TABLET(10 MG) BY MOUTH DAILY 90 tablet 3   sotalol (BETAPACE) 160 MG tablet TAKE 1  TABLET BY MOUTH EVERY MORNING AND 1/2 TABLET EVERY EVENING 45 tablet 6   spironolactone (ALDACTONE) 25 MG tablet Take 1 tablet (25 mg total) by mouth daily. 30 tablet 11   thyroid (ARMOUR) 120 MG tablet Take 120 mg by mouth daily before breakfast.     traZODone (DESYREL) 50 MG tablet Take 0.5 tablets (25 mg total) by mouth at bedtime as needed for sleep. 30 tablet 0   triamcinolone (NASACORT) 55 MCG/ACT AERO nasal inhaler Place 2 sprays into the nose daily.      Current Facility-Administered Medications on File Prior to Encounter  Medication Dose Route Frequency Provider Last Rate Last Admin   bivalirudin (ANGIOMAX) 250 mg in sodium chloride 0.9 % 50 mL (5 mg/mL) infusion    Continuous PRN Kathleene Hazel, MD   Stopped at 09/20/15 0733   bivalirudin (ANGIOMAX) BOLUS via infusion    PRN Kathleene Hazel, MD   54.45 mg at 09/20/15 5537   fentaNYL (SUBLIMAZE) injection    PRN Kathleene Hazel, MD   25 mcg at 09/20/15 4827   heparin 1,500 mL    PRN Kathleene Hazel, MD   Given at 09/20/15 0657   heparin infusion 2 units/mL in 0.9 % sodium chloride    Continuous PRN Kathleene Hazel, MD   1,500 mL at 09/20/15 0733    iopamidol (ISOVUE-370) 76 % injection    PRN Kathleene Hazel, MD   165 mL at 09/20/15 0733   lidocaine (PF) (XYLOCAINE) 1 % injection    PRN Kathleene Hazel, MD   2 mL at 09/20/15 0786   midazolam (VERSED) injection    PRN Kathleene Hazel, MD   1 mg at 09/20/15 7544   nitroGLYCERIN 1 mg/10 ml (100 mcg/ml) - IR/CATH LAB    PRN Kathleene Hazel, MD   200 mcg at 09/20/15 9201   Radial Cocktail/Verapamil only    PRN Kathleene Hazel, MD   10 mL at 09/20/15 0071   ticagrelor (BRILINTA) tablet    PRN Kathleene Hazel, MD   180 mg at 09/20/15 2197   tirofiban (AGGRASTAT) bolus via infusion    PRN Kathleene Hazel, MD   1,815 mcg at 09/20/15 0647   tirofiban (AGGRASTAT) infusion 50 mcg/mL 100 mL    Continuous PRN Kathleene Hazel, MD 13.1 mL/hr at 09/20/15 0651 0.15 mcg/kg/min at 09/20/15 0651    BP 104/70   Temp 99 F (37.2 C)   Wt 67.9 kg (149 lb 9.6 oz)   BMI 22.09 kg/m  General: NAD Neck: No JVD, no thyromegaly or thyroid nodule.  Lungs: Clear to auscultation bilaterally with normal respiratory effort. CV: Nondisplaced PMI.  Heart regular S1/S2, no S3/S4, no murmur.  No peripheral edema.  No carotid bruit.  Normal pedal pulses.  Abdomen: Soft, nontender, no hepatosplenomegaly, no distention.  Skin: Intact without lesions or rashes.  Neurologic: Alert and oriented x 3.  Psych: Normal affect. Extremities: No clubbing or cyanosis.  HEENT: Normal.   Assessment/Plan: 1. CAD: S/p anterior MI in 7/17 with DES to RCA.  Cath in 10/19 with no obstructive CAD.  She had an episode of CP related to emotional stress about 3-4 weeks ago, no chest pain since that time.  - She is off Plavix now and taking apixaban 5 mg bid.   - She is tolerating Crestor without myalgias.  Check lipids next appt.       2. Chronic systolic CHF: Ischemic cardiomyopathy.  EF 25% on echo in 10/17, 30-35% on echo in 11/17, 30% on TEE in 8/18. She has extensive  LAD-territory scar. 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  Echo in 1/22 showed that EF remained 25-30% with LAD-territory scar.  She is not volume overloaded on exam, still with NYHA class III symptoms with prominent fatigue.  Low BP has made adjustment of GDMT difficult.  She denies significant lightheadedness.   - Off Coreg with low BP and addition of sotalol.   - She has tolerated losartan and SBP > 100 today.  No orthostatic symptoms.  I will have her stop losartan and add Entresto 24/26 bid.  BMET today and in 10 days. If she does not tolerate Entresto, she will go back to losartan.  - With addition of Entresto, I will have her stop Lasix (has been taking 20 mg daily) and stop KCl.   - She did not tolerate dapagliflozin due to yeast infections.   - Continue spironolactone 25 mg daily.   - QRS does not appear to be wide enough that she would have benefit from CRT.  - I will have her evaluated for Batwire and ANTHEM.  3. Mitral regurgitation: Moderate to severe on 10/17 echo, severe on 11/17 echo.  I did a TEE in 8/18.  This showed moderate MR that appeared to be functional.  No indication for surgery or Mitraclip. Echo in 1/22 showed mild-moderate MR.  4. Atrial fibrillation: Unable to tolerate amiodarone due to GI side effects.  She is in NSR today on Eliquis and sotalol. QTc interval is acceptable on sotalol on today's ECG.    5. PVCs: Very frequent on 12/18 holter (41% beats), she has had a PVC ablation in 2/19. Due to recurrence of PVCs post-ablation, she is now on sotalol. She does not seem to have many PVCs.  - Continue sotalol, QTc ok on ECG today.  6. Hyperlipidemia: Tolerating Crestor, good lipids in 10/21.  Recheck next appointment.   Followup in 6 weeks.   Marca Ancona 10/20/2020

## 2020-10-23 ENCOUNTER — Other Ambulatory Visit (HOSPITAL_COMMUNITY): Payer: Self-pay

## 2020-10-23 MED ORDER — SPIRONOLACTONE 25 MG PO TABS: 25.0000 mg | ORAL_TABLET | Freq: Every day | ORAL | 1 refills | Status: DC

## 2020-10-24 ENCOUNTER — Other Ambulatory Visit: Payer: Self-pay | Admitting: *Deleted

## 2020-10-24 ENCOUNTER — Ambulatory Visit (HOSPITAL_COMMUNITY): Payer: BC Managed Care – PPO | Admitting: Physician Assistant

## 2020-10-24 DIAGNOSIS — Z006 Encounter for examination for normal comparison and control in clinical research program: Secondary | ICD-10-CM

## 2020-10-24 NOTE — Progress Notes (Signed)
Orders only

## 2020-11-20 ENCOUNTER — Telehealth (HOSPITAL_COMMUNITY): Payer: Self-pay | Admitting: Cardiology

## 2020-11-28 ENCOUNTER — Encounter (HOSPITAL_COMMUNITY): Payer: Self-pay | Admitting: Emergency Medicine

## 2020-11-28 ENCOUNTER — Other Ambulatory Visit: Payer: Self-pay

## 2020-11-28 ENCOUNTER — Inpatient Hospital Stay (HOSPITAL_COMMUNITY)
Admission: EM | Admit: 2020-11-28 | Discharge: 2020-12-04 | DRG: 378 | Disposition: A | Discharge status: Home / Self Care | Payer: BC Managed Care – PPO | Attending: Internal Medicine | Admitting: Internal Medicine

## 2020-11-28 DIAGNOSIS — D696 Thrombocytopenia, unspecified: Secondary | ICD-10-CM | POA: Diagnosis not present

## 2020-11-28 DIAGNOSIS — I471 Supraventricular tachycardia: Secondary | ICD-10-CM | POA: Diagnosis not present

## 2020-11-28 DIAGNOSIS — Z8673 Personal history of transient ischemic attack (TIA), and cerebral infarction without residual deficits: Secondary | ICD-10-CM

## 2020-11-28 DIAGNOSIS — E1122 Type 2 diabetes mellitus with diabetic chronic kidney disease: Secondary | ICD-10-CM | POA: Diagnosis present

## 2020-11-28 DIAGNOSIS — K449 Diaphragmatic hernia without obstruction or gangrene: Secondary | ICD-10-CM | POA: Diagnosis present

## 2020-11-28 DIAGNOSIS — F32A Depression, unspecified: Secondary | ICD-10-CM | POA: Diagnosis present

## 2020-11-28 DIAGNOSIS — K922 Gastrointestinal hemorrhage, unspecified: Secondary | ICD-10-CM | POA: Diagnosis present

## 2020-11-28 DIAGNOSIS — K5730 Diverticulosis of large intestine without perforation or abscess without bleeding: Secondary | ICD-10-CM | POA: Diagnosis present

## 2020-11-28 DIAGNOSIS — B379 Candidiasis, unspecified: Secondary | ICD-10-CM | POA: Diagnosis present

## 2020-11-28 DIAGNOSIS — Z79890 Hormone replacement therapy (postmenopausal): Secondary | ICD-10-CM

## 2020-11-28 DIAGNOSIS — Z20822 Contact with and (suspected) exposure to covid-19: Secondary | ICD-10-CM | POA: Diagnosis present

## 2020-11-28 DIAGNOSIS — I5042 Chronic combined systolic (congestive) and diastolic (congestive) heart failure: Secondary | ICD-10-CM | POA: Diagnosis present

## 2020-11-28 DIAGNOSIS — I472 Ventricular tachycardia, unspecified: Secondary | ICD-10-CM | POA: Diagnosis not present

## 2020-11-28 DIAGNOSIS — I480 Paroxysmal atrial fibrillation: Secondary | ICD-10-CM | POA: Diagnosis present

## 2020-11-28 DIAGNOSIS — I5022 Chronic systolic (congestive) heart failure: Secondary | ICD-10-CM | POA: Diagnosis present

## 2020-11-28 DIAGNOSIS — Z801 Family history of malignant neoplasm of trachea, bronchus and lung: Secondary | ICD-10-CM

## 2020-11-28 DIAGNOSIS — I130 Hypertensive heart and chronic kidney disease with heart failure and stage 1 through stage 4 chronic kidney disease, or unspecified chronic kidney disease: Secondary | ICD-10-CM | POA: Diagnosis present

## 2020-11-28 DIAGNOSIS — E039 Hypothyroidism, unspecified: Secondary | ICD-10-CM | POA: Diagnosis present

## 2020-11-28 DIAGNOSIS — K30 Functional dyspepsia: Secondary | ICD-10-CM | POA: Diagnosis present

## 2020-11-28 DIAGNOSIS — I459 Conduction disorder, unspecified: Secondary | ICD-10-CM | POA: Diagnosis present

## 2020-11-28 DIAGNOSIS — K921 Melena: Principal | ICD-10-CM | POA: Diagnosis present

## 2020-11-28 DIAGNOSIS — Z79899 Other long term (current) drug therapy: Secondary | ICD-10-CM

## 2020-11-28 DIAGNOSIS — D62 Acute posthemorrhagic anemia: Secondary | ICD-10-CM | POA: Diagnosis present

## 2020-11-28 DIAGNOSIS — I255 Ischemic cardiomyopathy: Secondary | ICD-10-CM | POA: Diagnosis present

## 2020-11-28 DIAGNOSIS — K219 Gastro-esophageal reflux disease without esophagitis: Secondary | ICD-10-CM | POA: Diagnosis present

## 2020-11-28 DIAGNOSIS — Z881 Allergy status to other antibiotic agents status: Secondary | ICD-10-CM

## 2020-11-28 DIAGNOSIS — F419 Anxiety disorder, unspecified: Secondary | ICD-10-CM | POA: Diagnosis not present

## 2020-11-28 DIAGNOSIS — E785 Hyperlipidemia, unspecified: Secondary | ICD-10-CM | POA: Diagnosis present

## 2020-11-28 DIAGNOSIS — E890 Postprocedural hypothyroidism: Secondary | ICD-10-CM | POA: Diagnosis present

## 2020-11-28 DIAGNOSIS — I959 Hypotension, unspecified: Secondary | ICD-10-CM | POA: Diagnosis present

## 2020-11-28 DIAGNOSIS — Z955 Presence of coronary angioplasty implant and graft: Secondary | ICD-10-CM

## 2020-11-28 DIAGNOSIS — Z888 Allergy status to other drugs, medicaments and biological substances status: Secondary | ICD-10-CM

## 2020-11-28 DIAGNOSIS — I252 Old myocardial infarction: Secondary | ICD-10-CM

## 2020-11-28 DIAGNOSIS — Z7901 Long term (current) use of anticoagulants: Secondary | ICD-10-CM

## 2020-11-28 DIAGNOSIS — N183 Chronic kidney disease, stage 3 unspecified: Secondary | ICD-10-CM | POA: Diagnosis present

## 2020-11-28 DIAGNOSIS — Z825 Family history of asthma and other chronic lower respiratory diseases: Secondary | ICD-10-CM

## 2020-11-28 DIAGNOSIS — I340 Nonrheumatic mitral (valve) insufficiency: Secondary | ICD-10-CM | POA: Diagnosis present

## 2020-11-28 DIAGNOSIS — Z95810 Presence of automatic (implantable) cardiac defibrillator: Secondary | ICD-10-CM

## 2020-11-28 DIAGNOSIS — I2510 Atherosclerotic heart disease of native coronary artery without angina pectoris: Secondary | ICD-10-CM | POA: Diagnosis present

## 2020-11-28 DIAGNOSIS — Z885 Allergy status to narcotic agent status: Secondary | ICD-10-CM

## 2020-11-28 DIAGNOSIS — K317 Polyp of stomach and duodenum: Secondary | ICD-10-CM | POA: Diagnosis present

## 2020-11-28 DIAGNOSIS — M549 Dorsalgia, unspecified: Secondary | ICD-10-CM | POA: Diagnosis present

## 2020-11-28 DIAGNOSIS — Z8249 Family history of ischemic heart disease and other diseases of the circulatory system: Secondary | ICD-10-CM

## 2020-11-28 NOTE — ED Triage Notes (Signed)
Pt c/o dizziness on standing tonight, feeling clammy, and an episode of dark stools. Currently on eliquis. Hx anemia and iron transfusions, not currently taking or receiving iron.

## 2020-11-29 ENCOUNTER — Encounter (HOSPITAL_COMMUNITY): Payer: Self-pay | Admitting: Student

## 2020-11-29 DIAGNOSIS — Z9229 Personal history of other drug therapy: Secondary | ICD-10-CM

## 2020-11-29 DIAGNOSIS — K922 Gastrointestinal hemorrhage, unspecified: Secondary | ICD-10-CM | POA: Diagnosis not present

## 2020-11-29 DIAGNOSIS — E039 Hypothyroidism, unspecified: Secondary | ICD-10-CM

## 2020-11-29 DIAGNOSIS — I480 Paroxysmal atrial fibrillation: Secondary | ICD-10-CM

## 2020-11-29 DIAGNOSIS — D62 Acute posthemorrhagic anemia: Secondary | ICD-10-CM | POA: Diagnosis not present

## 2020-11-29 DIAGNOSIS — I5022 Chronic systolic (congestive) heart failure: Secondary | ICD-10-CM

## 2020-11-29 LAB — RESP PANEL BY RT-PCR (FLU A&B, COVID) ARPGX2
Influenza A by PCR: NEGATIVE
Influenza B by PCR: NEGATIVE
SARS Coronavirus 2 by RT PCR: NEGATIVE

## 2020-11-29 LAB — CBC
HCT: 26.8 % — ABNORMAL LOW (ref 36.0–46.0)
Hemoglobin: 8.6 g/dL — ABNORMAL LOW (ref 12.0–15.0)
MCH: 30 pg (ref 26.0–34.0)
MCHC: 32.1 g/dL (ref 30.0–36.0)
MCV: 93.4 fL (ref 80.0–100.0)
Platelets: 164 10*3/uL (ref 150–400)
RBC: 2.87 MIL/uL — ABNORMAL LOW (ref 3.87–5.11)
RDW: 12.9 % (ref 11.5–15.5)
WBC: 7.2 10*3/uL (ref 4.0–10.5)
nRBC: 0 % (ref 0.0–0.2)

## 2020-11-29 LAB — POC OCCULT BLOOD, ED: Fecal Occult Bld: POSITIVE — AB

## 2020-11-29 LAB — COMPREHENSIVE METABOLIC PANEL
ALT: 12 U/L (ref 0–44)
AST: 14 U/L — ABNORMAL LOW (ref 15–41)
Albumin: 3.5 g/dL (ref 3.5–5.0)
Alkaline Phosphatase: 47 U/L (ref 38–126)
Anion gap: 8 (ref 5–15)
BUN: 50 mg/dL — ABNORMAL HIGH (ref 6–20)
CO2: 25 mmol/L (ref 22–32)
Calcium: 8.7 mg/dL — ABNORMAL LOW (ref 8.9–10.3)
Chloride: 101 mmol/L (ref 98–111)
Creatinine, Ser: 0.95 mg/dL (ref 0.44–1.00)
GFR, Estimated: >60 mL/min (ref >60)
Glucose, Bld: 162 mg/dL — ABNORMAL HIGH (ref 70–99)
Potassium: 4.3 mmol/L (ref 3.5–5.1)
Sodium: 134 mmol/L — ABNORMAL LOW (ref 135–145)
Total Bilirubin: 0.3 mg/dL (ref 0.3–1.2)
Total Protein: 5.8 g/dL — ABNORMAL LOW (ref 6.5–8.1)

## 2020-11-29 LAB — ABO/RH: ABO/RH(D): A NEG

## 2020-11-29 LAB — RETICULOCYTES
Immature Retic Fract: 29.3 % — ABNORMAL HIGH (ref 2.3–15.9)
RBC.: 3.11 MIL/uL — ABNORMAL LOW (ref 3.87–5.11)
Retic Count, Absolute: 65.9 10*3/uL (ref 19.0–186.0)
Retic Ct Pct: 2.1 % (ref 0.4–3.1)

## 2020-11-29 LAB — HEMOGLOBIN AND HEMATOCRIT, BLOOD
HCT: 25.7 % — ABNORMAL LOW (ref 36.0–46.0)
Hemoglobin: 8.5 g/dL — ABNORMAL LOW (ref 12.0–15.0)

## 2020-11-29 LAB — PREPARE RBC (CROSSMATCH)

## 2020-11-29 MED ORDER — PANTOPRAZOLE SODIUM 40 MG IV SOLR
40.0000 mg | Freq: Two times a day (BID) | INTRAVENOUS | Status: DC
  Administered 2020-12-02 – 2020-12-04 (×4): 40 mg via INTRAVENOUS
  Filled 2020-11-29 (×4): qty 40

## 2020-11-29 MED ORDER — ONDANSETRON HCL 4 MG/2ML IJ SOLN
4.0000 mg | Freq: Four times a day (QID) | INTRAMUSCULAR | Status: DC | PRN
  Administered 2020-11-30: 4 mg via INTRAVENOUS
  Filled 2020-11-29: qty 2

## 2020-11-29 MED ORDER — SODIUM CHLORIDE 0.9 % IV SOLN
250.0000 mg | Freq: Every day | INTRAVENOUS | Status: AC
  Administered 2020-11-29 – 2020-11-30 (×2): 250 mg via INTRAVENOUS
  Filled 2020-11-29 (×3): qty 20

## 2020-11-29 MED ORDER — ACETAMINOPHEN 325 MG PO TABS: 650.0000 mg | ORAL_TABLET | Freq: Four times a day (QID) | ORAL | Status: DC | PRN

## 2020-11-29 MED ORDER — PANTOPRAZOLE INFUSION (NEW) - SIMPLE MED
8.0000 mg/h | INTRAVENOUS | Status: AC
  Administered 2020-11-29: 8 mg/h via INTRAVENOUS
  Filled 2020-11-29: qty 80

## 2020-11-29 MED ORDER — FUROSEMIDE 10 MG/ML IJ SOLN
20.0000 mg | Freq: Once | INTRAMUSCULAR | Status: AC
  Administered 2020-11-29: 20 mg via INTRAVENOUS
  Filled 2020-11-29: qty 2

## 2020-11-29 MED ORDER — SODIUM CHLORIDE 0.9% IV SOLUTION: Freq: Once | INTRAVENOUS | Status: DC

## 2020-11-29 MED ORDER — ACETAMINOPHEN 650 MG RE SUPP: 650.0000 mg | Freq: Four times a day (QID) | RECTAL | Status: DC | PRN

## 2020-11-29 MED ORDER — PANTOPRAZOLE 80MG IVPB - SIMPLE MED
80.0000 mg | Freq: Once | INTRAVENOUS | Status: AC
  Administered 2020-11-29: 80 mg via INTRAVENOUS
  Filled 2020-11-29: qty 80

## 2020-11-29 NOTE — ED Provider Notes (Signed)
[facility] EMERGENCY DEPARTMENT Provider Note   CSN: [id number] Arrival date & time: 11/28/20  2258     History Chief Complaint  Patient presents with   Dizziness    Catherine Mays is a 55 y.o. female with a hx of CHF w/ last EF 25-30% w/ AICD, paroxysmal afib, CAD, ischemic cardiomyopathy, and chronic anticoagulation on eliquis who presents to the ED with complaints of dizziness tonight.  Patient states she is been having intermittent dizziness described as the room spinning and feeling as though she may pass out, states that she does have some mild dizziness with her long standing history of heart failure, however this has been different as she typically does not feel as though she is going to pass out and it is typically not this prolonged. Dizziness is intermittent, at times is associated with diaphoresis, nausea & vomiting- has had 2-3 episodes in the waiting room.  No specific alleviating or aggravating factors.  She also has had dark/black stools over the past 2 days as well as some stomach upset after eating and fatigue.  She has been taking her Eliquis, her most recent dose however was just under 24 hours ago.  She also reports she has been taking Advil 2 tablets every 2-3 nights.  She denies alcohol use.  She denies fever, hematemesis, syncope, chest pain, or shortness of breath. She additionally denies numbness, focal weakness, speech change, or visual disturbance.  HPI     Past Medical History:  Diagnosis Date   AICD (automatic cardioverter/defibrillator) present    [employer]   CAD in native artery    a. late recognition of presentation of STEMI 08/2015 s/p DES to LAD, mild residual mRCA.   Chronic systolic CHF (congestive heart failure) (HCC)    CKD (chronic kidney disease), stage III (HCC)    Hypotension    a. preventing med titration for HF.   Hypothyroidism 09/24/2015   Ischemic cardiomyopathy    Myocardial infarction [facility]) 08/2015   PAF (paroxysmal  atrial fibrillation) (HCC) 09/24/2015   a. dx at time of STEMI 08/2015.   Pre-diabetes    Presence of permanent cardiac pacemaker    patient has ICD   PVC's (premature ventricular contractions)     Patient Active Problem List   Diagnosis Date Noted   IDA (iron deficiency anemia) 05/23/2019   GERD (gastroesophageal reflux disease) 05/23/2019   Nausea without vomiting 05/23/2019   Weakness 12/18/2017   Sensation of chest pressure 12/17/2017   Encounter for screening colonoscopy 10/19/2017   PVC (premature ventricular contraction) 04/02/2017   Mitral regurgitation 04/17/2016   CHF (congestive heart failure) (HCC) 01/23/2016   PVC's (premature ventricular contractions)    Pre-diabetes    Chronic systolic CHF (congestive heart failure) (HCC)    Ischemic cardiomyopathy    Hypotension    CAD in native artery    Hypothyroidism 09/24/2015   PAF (paroxysmal atrial fibrillation) (HCC) 09/24/2015    Past Surgical History:  Procedure Laterality Date   BIOPSY  06/16/2019   Procedure: BIOPSY;  Surgeon: Corbin Ade, MD;  Location: [facility] ENDO [facility]ITE;  Service: Endoscopy;;   CARDIAC CATHETERIZATION N/A 09/20/2015   Procedure: Left Heart Cath and Coronary Angiography;  Surgeon: Kathleene Hazel, MD;  Location: [facility] INVASIVE CV LAB;  Service: Cardiovascular;  Laterality: N/A;   CARDIAC CATHETERIZATION N/A 09/20/2015   Procedure: Coronary Stent Intervention;  Surgeon: Kathleene Hazel, MD;  Location: [facility] INVASIVE CV LAB;  Service: Cardiovascular;  Laterality: N/A;  CARDIAC CATHETERIZATION N/A 09/21/2015   Procedure: Left Heart Cath and Coronary Angiography;  Surgeon: Tonny Bollman, MD;  Location: [facility] INVASIVE CV LAB;  Service: Cardiovascular;  Laterality: N/A;   CARDIAC CATHETERIZATION N/A 11/26/2015   Procedure: Right Heart Cath;  Surgeon: [name], MD;  Location: [facility] INVASIVE CV LAB;  Service: Cardiovascular;  Laterality: N/A;   COLONOSCOPY WITH PROPOFOL N/A 11/23/2017   Dr. Jena Gauss:  Diverticulosis, internal hemorrhoids, next colonoscopy in 10 years   COLONOSCOPY WITH PROPOFOL N/A 08/10/2019   Procedure: COLONOSCOPY WITH PROPOFOL;  Surgeon: Corbin Ade, MD;  Diverticulosis in sigmoid colon, nonbleeding internal hemorrhoids, otherwise normal exam.     CORONARY STENT PLACEMENT  09/20/2015   EP IMPLANTABLE DEVICE N/A 01/23/2016   Procedure: ICD Implant;  Surgeon: Will Jorja Loa, MD;  Location: [facility] INVASIVE CV LAB;  Service: Cardiovascular;  Laterality: N/A;   ESOPHAGOGASTRODUODENOSCOPY (EGD) WITH PROPOFOL N/A 06/16/2019   Procedure: ESOPHAGOGASTRODUODENOSCOPY (EGD) WITH PROPOFOL;  Surgeon: Corbin Ade, MD;  Normal esophagus, small hiatal hernia, normal examined stomach, normal examined duodenum.  Gastric biopsy with slight chronic inflammation, duodenal biopsy with slight intramucosal Brunner gland hyperplasia.   GIVENS CAPSULE STUDY N/A 11/23/2019    Surgeon: Corbin Ade, MD; essentially unremarkable except for tiny erosion in the proximal small bowel.   LEFT HEART CATH AND CORONARY ANGIOGRAPHY N/A 12/18/2017   Procedure: LEFT HEART CATH AND CORONARY ANGIOGRAPHY;  Surgeon: [name], MD;  Location: [facility] INVASIVE CV LAB;  Service: Cardiovascular;  Laterality: N/A;   PVC ABLATION N/A 04/02/2017   Procedure: PVC ABLATION;  Surgeon: Regan Lemming, MD;  Location: [facility] INVASIVE CV LAB;  Service: Cardiovascular;  Laterality: N/A;   TEE WITHOUT CARDIOVERSION N/A 10/08/2016   Procedure: TRANSESOPHAGEAL ECHOCARDIOGRAM (TEE);  Surgeon: [name], MD;  Location: [facility] ENDOSCOPY;  Service: Cardiovascular;  Laterality: N/A;   THYROIDECTOMY       OB History   No obstetric history on file.     Family History  Problem Relation Age of Onset   Heart attack Father    Arrhythmia Mother    Lung cancer Mother        bronchiectasis   Colon cancer Neg Hx    Colon polyps Neg Hx     Social History   Tobacco Use   Smoking status: Never   Smokeless tobacco: Never   Vaping Use   Vaping Use: Never used  Substance Use Topics   Alcohol use: No   Drug use: No    Home Medications Prior to Admission medications   Medication Sig Start Date End Date Taking? Authorizing Provider  apixaban (ELIQUIS) 5 MG TABS tablet Take 1 tablet (5 mg total) by mouth 2 (two) times daily. 03/23/20   [name], MD  buPROPion (WELLBUTRIN XL) 300 MG 24 hr tablet Take 300 mg by mouth daily. 05/10/19   [provider]  Calcium Carb-Cholecalciferol (CALCIUM + D3 PO) Take 1 tablet by mouth every morning.    [provider]  Coenzyme Q10 (COQ10) 200 MG CAPS Take 200 mg by mouth daily.    [provider]  dexlansoprazole (DEXILANT) 60 MG capsule Take 1 capsule (60 mg total) by mouth daily. 04/18/20   Letta Median, PA-C  fexofenadine (ALLEGRA) 180 MG tablet Take 180 mg by mouth daily.    [provider]  nitroGLYCERIN (NITROSTAT) 0.4 MG SL tablet PLACE 1 TABLET UNDER THE TONGUE EVERY 5 MINUTES AS NEEDED FOR CHEST PAIN 04/13/20   Shirlee Latch,  Eliot Ford, MD  rosuvastatin (CRESTOR) 10 MG tablet TAKE 1 TABLET(10 MG) BY MOUTH DAILY 05/15/20   [name], MD  sacubitril-valsartan (ENTRESTO) 24-26 MG Take 1 tablet by mouth 2 (two) times daily. 10/19/20   [name], MD  sotalol (BETAPACE) 160 MG tablet TAKE 1 TABLET BY MOUTH EVERY MORNING AND 1/2 TABLET EVERY EVENING 10/09/20   [name], MD  spironolactone (ALDACTONE) 25 MG tablet Take 1 tablet (25 mg total) by mouth daily. 10/23/20   [name], MD  thyroid (ARMOUR) 120 MG tablet Take 120 mg by mouth daily before breakfast.    [provider]  traZODone (DESYREL) 50 MG tablet Take 0.5 tablets (25 mg total) by mouth at bedtime as needed for sleep. 11/26/15   Clegg, Amy D, NP  triamcinolone (NASACORT) 55 MCG/ACT AERO nasal inhaler Place 2 sprays into the nose daily.     [provider]    Allergies    Paxil [paroxetine], Morphine and related, Percocet  [oxycodone-acetaminophen], Cefdinir, and Zolpidem  Review of Systems   Review of Systems  Constitutional:  Positive for fatigue. Negative for fever.  Eyes:  Negative for visual disturbance.  Respiratory:  Negative for shortness of breath.   Cardiovascular:  Negative for chest pain.  Gastrointestinal:  Positive for abdominal pain, blood in stool, nausea and vomiting.  Neurological:  Positive for dizziness and light-headedness. Negative for syncope, speech difficulty and weakness (focal).  All other systems reviewed and are negative.  Physical Exam Updated Vital Signs BP (!) 90/54 (BP Location: Right Arm)   Pulse (!) 54   Temp 98.2 F (36.8 C) (Oral)   Resp 16   SpO2 100%   Physical Exam Vitals and nursing note reviewed.  Constitutional:      General: She is not in acute distress.    Appearance: She is well-developed. She is not toxic-appearing.  HENT:     Head: Normocephalic and atraumatic.  Eyes:     General:        Right eye: No discharge.        Left eye: No discharge.     Extraocular Movements: Extraocular movements intact.     Pupils: Pupils are equal, round, and reactive to light.     Comments: Conjunctival pallor noted. No vertical or rotational nystagmus noted.  Cardiovascular:     Rate and Rhythm: Normal rate and regular rhythm.  Pulmonary:     Effort: Pulmonary effort is normal. No respiratory distress.     Breath sounds: Normal breath sounds. No wheezing, rhonchi or rales.  Abdominal:     General: There is no distension.     Palpations: Abdomen is soft.     Tenderness: There is no abdominal tenderness. There is no guarding or rebound.  Genitourinary:    Comments: RN present as chaperone.  DRE with melena present. Musculoskeletal:     Cervical back: Neck supple.  Skin:    General: Skin is warm and dry.     Findings: No rash.  Neurological:     Mental Status: She is alert.     Comments: Clear speech.  CN II through XII grossly intact.  Sensation grossly  tact bilateral upper and lower extremities.  5 out of 5 symmetric grip strength.  Intact finger-nose.  Psychiatric:        Behavior: Behavior normal.    ED Results / Procedures / Treatments   Labs (all labs ordered are listed, but only abnormal results are displayed) Labs  Reviewed  COMPREHENSIVE METABOLIC PANEL - Abnormal; Notable for the following components:      Result Value   Sodium 134 (*)    Glucose, Bld 162 (*)    BUN 50 (*)    Calcium 8.7 (*)    Total Protein 5.8 (*)    AST 14 (*)    All other components within normal limits  CBC - Abnormal; Notable for the following components:   RBC 2.87 (*)    Hemoglobin 8.6 (*)    HCT 26.8 (*)    All other components within normal limits  POC OCCULT BLOOD, ED  TYPE AND SCREEN  ABO/RH    EKG None  Radiology No results found.  Procedures Procedures   Medications Ordered in ED Medications  pantoprazole (PROTONIX) 80 mg /NS 100 mL IVPB (has no administration in time range)  pantoprozole (PROTONIX) 80 mg /NS 100 mL infusion (has no administration in time range)  pantoprazole (PROTONIX) injection 40 mg (has no administration in time range)    ED Course  I have reviewed the triage vital signs and the nursing notes.  Pertinent labs & imaging results that were available during my care of the patient were reviewed by me and considered in my medical decision making (see chart for details).    MDM Rules/Calculators/A&P                           Patient presents to the ED with complaints of dizziness.  Patient is nontoxic, BP noted to be soft at times, however typical blood pressure runs on the lower side on chart review.  On exam patient has no focal neurologic deficits.  Her abdomen is nontender without peritoneal signs.  She does have melena on DRE which is fecal occult positive.  Additional history obtained:  Additional history obtained from chart review & nursing note review.  Echo 02/2020: EF 25-30% Upper endoscopy  05/2019 Impression: Normal esophagus, small hiatal hernia, examination was otherwise normal, S/p gastric & small bowel biopsy Colonoscopy 07/2019: Impression: Diverticulosis in the sigmoid colon, non-bleeding internal hemorrhoids, no specimens collected.  Capsule study performed 10/2019.   Lab Tests:  I Ordered, reviewed, and interpreted labs, which included:  CBC: Anemia which is new compared to most recent labs. CMP: BUN is notably elevated at 58. Fecal occult testing: Positive  ED Course:  Patient presents to the emergency department with findings consistent with GI bleed.  She takes Eliquis, however her last dose was almost 24 hours ago therefore not emergently reversing.  Her initial hemoglobin is 8.6 and hematocrit is 26.8, this was from about 6 hours ago, will obtain repeat hemoglobin and hematocrit and hold off on blood administration at this time.  We will start Protonix bolus and drip.  Plan for admission for further evaluation and monitoring. Message sent to GI service to facilitate consultation.   This is a shared visit with supervising physician Dr. Bebe Shaggy who has independently evaluated patient & provided guidance in evaluation/management/disposition, in agreement with care   05:59: CONSULT: Discussed with hospitalist Dr. Arlean Hopping- accepts admission.   Portions of this note were generated with Scientist, clinical (histocompatibility and immunogenetics). Dictation errors may occur despite best attempts at proofreading.  Final Clinical Impression(s) / ED Diagnoses Final diagnoses:  Gastrointestinal hemorrhage, unspecified gastrointestinal hemorrhage type    Rx / DC Orders ED Discharge Orders     None        Kelilah Hebard R, PA-C 11/29/20 0600  Zadie Rhine, MD 11/29/20 2321

## 2020-11-29 NOTE — Consult Note (Addendum)
Referring Provider:  [facility]         Primary Care Physician:  Benita Stabile, MD Primary Gastroenterologist:  Dr. Jena Gauss  Reason for Consultation:   anemia                ASSESSMENT / PLAN   # 55 yo female with severe acute on chronic iron deficiency anemia with melena in setting of NSAIDs and Eliquis. Followed by [facility] and H[facility]ology, last IV iron was several months ago. Hgb down from 11.8 in May to 8.6. Prior extensive evaluation a year ago with EGD with small bowel biopsies, colonoscopy and video capsule study was unrevealing. This is apparently the first time she has had overt GI bleeding. Rule out PUD in setting of NSAIDS.  --PRBC infusion in progress --Patient will need EGD tomorrow ( 2 day Eliquis washout) --Continue PPI infusion --Trend hgb, give additional PRBC for hgb < 7.  --Ferritin is pending. 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  See PMH for any additional medical history.   Patient followed by Dr. Jena Gauss and Hematology for iron deficiency anemia  ED course:  Presented to ED yesterday with severe dizziness and dark stool . Hgb 8.6, down from 11.8 in late May. BUN elevated at 50 . Liver chemistries normal. DRE in ED positive for melena.  EKG, in comparison to most recent prior from 10/19/2020 shows sinus bradycardia with heart rate 58, nonspecific intraventricular conduction delay, T wave inversion in aVL,  which is unchanged from most recent prior EKG, no evidence of ST changes, including no evidence of ST elevation.  Stool black over the last couple of days in absence of oral iron or bismuth. She has had a couple of episodes of non-bloody emesis since yesterday. She endorses recent onset of periumbilical pain which is worse with eating. With her history of IDA she hasn't ever had overt bleeding until now. She has been taking Advil a couple of times a week.   PREVIOUS ENDOSCOPIC EVALUATIONS    Sept 2019 Screening colonoscopy  --complete exam, adequate prep --Diverticulosis and hemorrhoids  April 2021 EGD for reflux and iron deficiency anemia --small hiatal hernia --Duodenal biopsies with slight intramucosal Brunner gland hyperplasia, no evidence for sprue.  --Gastric biopsies with antral and oxyntic mucosa with slight chroinc inflammation. No H.pylori on  stain   June 2021 colonoscopy for heme positive, iron deficiency anemia --Adequate prep --TI intubated for 10 cm and appeared normal.  --Left sided diverticulosis, internal hemorrhoids  Sept 2021 Video capsule study Findings: Patient swallowed capsule without difficulty.  Study overall unremarkable.  Tiny erosion noted at 50 minutes and 44 seconds.  Prominent vasculature noted at 2 hours 43 minutes and 41 seconds.  No active bleeding noted on this study.  No masses, ulcers, or findings to explain iron deficiency anemia.  Past Medical History:  Diagnosis Date   AICD (automatic cardioverter/defibrillator) present    [employer]   CAD in native artery    a. late recognition of presentation of STEMI 08/2015 s/p DES to LAD, mild residual mRCA.   Chronic systolic CHF (congestive heart failure) (HCC)    CKD (chronic kidney disease), stage III (HCC)    Hypotension    a. preventing med titration for HF.   Hypothyroidism 09/24/2015   Ischemic cardiomyopathy    Myocardial infarction [facility]) 08/2015   PAF (paroxysmal atrial fibrillation) (HCC)  09/24/2015   a. dx at time of STEMI 08/2015.   Pre-diabetes    Presence of permanent cardiac pacemaker    patient has ICD   PVC's (premature ventricular contractions)     Past Surgical History:  Procedure Laterality Date   BIOPSY  06/16/2019   Procedure: BIOPSY;  Surgeon: Corbin Ade, MD;  Location: [facility];  Service: Endoscopy;;   CARDIAC CATHETERIZATION N/A 09/20/2015   Procedure: Left Heart Cath and Coronary Angiography;  Surgeon: Kathleene Hazel, MD;  Location: [facility] INVASIVE CV LAB;  Service: Cardiovascular;  Laterality: N/A;   CARDIAC CATHETERIZATION N/A 09/20/2015   Procedure: Coronary Stent Intervention;  Surgeon: Kathleene Hazel, MD;  Location: [facility] INVASIVE CV LAB;  Service: Cardiovascular;  Laterality: N/A;   CARDIAC CATHETERIZATION N/A 09/21/2015   Procedure: Left Heart Cath and Coronary Angiography;  Surgeon: Tonny Bollman, MD;  Location: [facility] INVASIVE CV LAB;  Service: Cardiovascular;  Laterality: N/A;   CARDIAC CATHETERIZATION N/A 11/26/2015   Procedure: Right Heart Cath;  Surgeon: [name], MD;  Location: [facility] INVASIVE CV LAB;  Service: Cardiovascular;  Laterality: N/A;   COLONOSCOPY WITH PROPOFOL N/A 11/23/2017   Dr. Jena Gauss: Diverticulosis, internal hemorrhoids, next colonoscopy in 10 years   COLONOSCOPY WITH PROPOFOL N/A 08/10/2019   Procedure: COLONOSCOPY WITH PROPOFOL;  Surgeon: Corbin Ade, MD;  Diverticulosis in sigmoid colon, nonbleeding internal hemorrhoids, otherwise normal exam.     CORONARY STENT PLACEMENT  09/20/2015   EP IMPLANTABLE DEVICE N/A 01/23/2016   Procedure: ICD Implant;  Surgeon: Will Jorja Loa, MD;  Location: [facility] INVASIVE CV LAB;  Service: Cardiovascular;  Laterality: N/A;   ESOPHAGOGASTRODUODENOSCOPY (EGD) WITH PROPOFOL N/A 06/16/2019   Procedure: ESOPHAGOGASTRODUODENOSCOPY (EGD) WITH PROPOFOL;  Surgeon: Corbin Ade, MD;  Normal esophagus, small hiatal hernia, normal examined stomach, normal examined duodenum.  Gastric  biopsy with slight chronic inflammation, duodenal biopsy with slight intramucosal Brunner gland hyperplasia.   GIVENS CAPSULE STUDY N/A 11/23/2019    Surgeon: Corbin Ade, MD; essentially unremarkable except for tiny erosion in the proximal small bowel.   LEFT HEART CATH AND CORONARY ANGIOGRAPHY N/A 12/18/2017   Procedure: LEFT HEART CATH AND CORONARY ANGIOGRAPHY;  Surgeon: [name], MD;  Location: [facility] INVASIVE CV LAB;  Service: Cardiovascular;  Laterality: N/A;   PVC ABLATION N/A 04/02/2017   Procedure: PVC ABLATION;  Surgeon: Regan Lemming, MD;  Location: [facility] INVASIVE CV LAB;  Service: Cardiovascular;  Laterality: N/A;   TEE WITHOUT CARDIOVERSION N/A 10/08/2016   Procedure: TRANSESOPHAGEAL ECHOCARDIOGRAM (TEE);  Surgeon: [name], MD;  Location: [facility] ENDOSCOPY;  Service: Cardiovascular;  Laterality: N/A;   THYROIDECTOMY      Prior to Admission medications   Medication Sig Start Date End Date Taking? Authorizing Provider  acetaminophen (TYLENOL) 500 MG tablet Take 1,000 mg by mouth every 6 (six) hours as needed for mild pain.   Yes [provider]  apixaban (ELIQUIS) 5 MG TABS tablet Take 1 tablet (5 mg total) by mouth 2 (two) times daily. 03/23/20  Yes [name], MD  buPROPion (WELLBUTRIN XL) 300 MG 24 hr tablet Take 300 mg by mouth daily. 05/10/19  Yes [provider]  Calcium Carb-Cholecalciferol (CALCIUM + D3 PO) Take 1 tablet by mouth every morning.   Yes [provider]  Coenzyme Q10 (COQ10) 200 MG CAPS Take 200 mg by mouth daily.   Yes [provider]  dexlansoprazole (DEXILANT) 60 MG capsule Take 1 capsule (60 mg total) by mouth daily. 04/18/20  Yes Letta Median, PA-C  fexofenadine (ALLEGRA) 180 MG tablet Take 180 mg by mouth daily.   Yes [provider]  furosemide (LASIX) 20 MG tablet Take 20 mg by mouth daily. 10/27/20  Yes [provider]  ibuprofen (ADVIL) 200 MG tablet Take 400 mg by mouth every 6  (six) hours as needed for headache or mild pain.   Yes [provider]  losartan (COZAAR) 25 MG tablet Take 25 mg by mouth daily.   Yes [provider]  nitroGLYCERIN (NITROSTAT) 0.4 MG SL tablet PLACE 1 TABLET UNDER THE TONGUE EVERY 5 MINUTES AS NEEDED FOR CHEST PAIN Patient taking differently: Place 0.4 mg under the tongue every 5 (five) minutes as needed for chest pain. 04/13/20  Yes [name], MD  rosuvastatin (CRESTOR) 10 MG tablet TAKE 1 TABLET(10 MG) BY MOUTH DAILY Patient taking differently: Take 10 mg by mouth daily. 05/15/20  Yes [name], MD  sacubitril-valsartan (ENTRESTO) 24-26 MG Take 1 tablet by mouth 2 (two) times daily. 10/19/20  Yes [name], MD  sotalol (BETAPACE) 160 MG tablet TAKE 1 TABLET BY MOUTH EVERY MORNING AND 1/2 TABLET EVERY EVENING Patient taking differently: Take 80-160 mg by mouth 2 (two) times daily. 160mg  in the morning and 80mg  in the evening 10/09/20  Yes , MD  spironolactone (ALDACTONE) 25 MG tablet Take 1 tablet (25 mg total) by mouth daily. 10/23/20  Yes [name], MD  thyroid (ARMOUR) 120 MG tablet Take 120 mg by mouth daily before breakfast.   Yes [provider]  traZODone (DESYREL) 50 MG tablet Take 0.5 tablets (25 mg total) by mouth at bedtime as needed for sleep. 11/26/15  Yes Clegg, Amy D, NP  triamcinolone (NASACORT) 55 MCG/ACT AERO nasal inhaler Place 2 sprays into the nose daily.    Yes [provider]    Current Facility-Administered Medications  Medication Dose Route Frequency Provider Last Rate Last Admin   0.9 %  sodium chloride infusion (Manually program via Guardrails IV Fluids)   Intravenous Once Howerter, Justin B, DO   Held at 11/29/20 01/26/16   acetaminophen (TYLENOL) tablet 650 mg  650 mg Oral Q6H PRN Howerter, Justin B, DO       Or   acetaminophen (TYLENOL) suppository 650 mg  650 mg Rectal Q6H PRN Howerter, Justin B, DO       ondansetron (ZOFRAN) injection 4 mg  4 mg Intravenous Q6H PRN Howerter, Justin B, DO       [START ON 12/02/2020] pantoprazole (PROTONIX) injection 40 mg  40 mg Intravenous Q12H Petrucelli, Samantha R, PA-C       pantoprozole (PROTONIX) 80 mg /NS 100 mL infusion  8 mg/hr Intravenous Continuous Howerter, Justin B, DO 10 mL/hr at 11/29/20 1324 8 mg/hr at 11/29/20 4010   Current Outpatient Medications  Medication Sig Dispense Refill   acetaminophen (TYLENOL) 500 MG tablet Take 1,000 mg by mouth every 6 (six) hours as needed for mild pain.     apixaban (ELIQUIS) 5 MG TABS tablet Take 1 tablet (5 mg total) by mouth 2 (two) times daily. 180 tablet 3   buPROPion (WELLBUTRIN XL) 300 MG 24 hr tablet Take 300 mg by mouth daily.     Calcium Carb-Cholecalciferol (CALCIUM + D3 PO) Take 1 tablet by mouth every morning.     Coenzyme Q10 (COQ10) 200 MG CAPS Take 200 mg by mouth daily.     dexlansoprazole (DEXILANT) 60 MG capsule Take 1 capsule (60 mg total) by mouth daily. 90 capsule 3   fexofenadine (ALLEGRA) 180 MG tablet Take 180 mg by mouth daily.     furosemide (LASIX) 20 MG tablet Take 20 mg by mouth daily.     ibuprofen (ADVIL) 200 MG tablet Take 400 mg by mouth every 6 (six) hours as needed for headache or mild pain.     losartan (COZAAR) 25 MG tablet Take 25 mg by mouth daily.     nitroGLYCERIN (NITROSTAT) 0.4 MG SL tablet PLACE 1 TABLET UNDER THE TONGUE EVERY 5 MINUTES AS NEEDED FOR CHEST PAIN (Patient taking differently: Place 0.4 mg under the tongue every 5 (five) minutes as needed for chest pain.) 25 tablet 3   rosuvastatin (CRESTOR) 10 MG tablet TAKE 1 TABLET(10 MG) BY MOUTH DAILY (Patient taking differently: Take 10 mg by mouth daily.) 90 tablet 3   sacubitril-valsartan (ENTRESTO) 24-26 MG Take 1 tablet by mouth 2 (two) times daily. 60 tablet 11   sotalol (BETAPACE) 160 MG tablet TAKE 1 TABLET BY MOUTH EVERY MORNING AND 1/2 TABLET EVERY EVENING (Patient taking differently: Take 80-160 mg by mouth 2 (two) times daily. 160mg  in the  morning and 80mg  in the evening) 45 tablet 6   spironolactone (ALDACTONE) 25 MG tablet Take 1 tablet (25 mg total) by mouth daily. 90 tablet 1   thyroid (ARMOUR) 120 MG tablet Take 120 mg by mouth daily before breakfast.     traZODone (DESYREL) 50 MG tablet Take 0.5 tablets (25 mg total) by mouth at bedtime as needed for sleep. 30 tablet 0   triamcinolone (NASACORT) 55 MCG/ACT AERO nasal inhaler Place 2 sprays into the nose daily.      Facility-Administered Medications Ordered in Other Encounters  Medication Dose Route Frequency Provider Last Rate Last Admin   bivalirudin (ANGIOMAX) 250 mg in sodium chloride 0.9 % 50 mL (5 mg/mL) infusion    Continuous PRN , MD   Stopped at 09/20/15 0733   bivalirudin (ANGIOMAX) BOLUS via infusion    PRN Kathleene Hazel, MD   54.45 mg at 09/20/15 Kathleene Hazel   fentaNYL (SUBLIMAZE) injection    PRN 09/22/15, MD   25 mcg at 09/20/15 Kathleene Hazel   heparin 1,500 mL    PRN 09/22/15, MD   Given at 09/20/15 0657   heparin infusion 2 units/mL in 0.9 % sodium chloride    Continuous PRN Kathleene Hazel,  MD   1,500 mL at 09/20/15 0733   iopamidol (ISOVUE-370) 76 % injection    PRN Kathleene Hazel, MD   165 mL at 09/20/15 0733   lidocaine (PF) (XYLOCAINE) 1 % injection    PRN Kathleene Hazel, MD   2 mL at 09/20/15 6333   midazolam (VERSED) injection    PRN Kathleene Hazel, MD   1 mg at 09/20/15 5456   nitroGLYCERIN 1 mg/10 ml (100 mcg/ml) - IR/CATH LAB    PRN Kathleene Hazel, MD   200 mcg at 09/20/15 2563   Radial Cocktail/Verapamil only    PRN Kathleene Hazel, MD   10 mL at 09/20/15 8937   ticagrelor (BRILINTA) tablet    PRN Kathleene Hazel, MD   180 mg at 09/20/15 3428   tirofiban (AGGRASTAT) bolus via infusion    PRN Kathleene Hazel, MD   1,815 mcg at 09/20/15 7681   tirofiban (AGGRASTAT) infusion 50 mcg/mL 100 mL    Continuous PRN Kathleene Hazel, MD  13.1 mL/hr at 09/20/15 0651 0.15 mcg/kg/min at 09/20/15 0651    Allergies as of 11/28/2020 - Review Complete 11/28/2020  Allergen Reaction Noted   Paxil [paroxetine] Palpitations 11/23/2015   Morphine and related Nausea And Vomiting 11/23/2015   Percocet [oxycodone-acetaminophen] Nausea And Vomiting 11/23/2015   Cefdinir Nausea Only 11/23/2015   Zolpidem Other (See Comments) 11/23/2015    Family History  Problem Relation Age of Onset   Heart attack Father    Arrhythmia Mother    Lung cancer Mother        bronchiectasis   Colon cancer Neg Hx    Colon polyps Neg Hx     Social History   Socioeconomic History   Marital status: Married    Spouse name: Not on file   Number of children: Not on file   Years of education: Not on file   Highest education level: Not on file  Occupational History   Not on file  Tobacco Use   Smoking status: Never   Smokeless tobacco: Never  Vaping Use   Vaping Use: Never used  Substance and Sexual Activity   Alcohol use: No   Drug use: No   Sexual activity: Not on file  Other Topics Concern   Not on file  Social History Narrative   Not on file   Social Determinants of Health   Financial Resource Strain: Low Risk    Difficulty of Paying Living Expenses: Not hard at all  Food Insecurity: No Food Insecurity   Worried About Programme researcher, broadcasting/film/video in the Last Year: Never true   Barista in the Last Year: Never true  Transportation Needs: No Transportation Needs   Lack of Transportation (Medical): No   Lack of Transportation (Non-Medical): No  Physical Activity: Insufficiently Active   Days of Exercise per Week: 3 days   Minutes of Exercise per Session: 30 min  Stress: No Stress Concern Present   Feeling of Stress : Not at all  Social Connections: Socially Integrated   Frequency of Communication with Friends and Family: More than three times a week   Frequency of Social Gatherings with Friends and Family: More than three times a week    Attends Religious Services: More than 4 times per year   Active Member of [employer] or Organizations: Yes   Attends Engineer, structural: More than 4 times per year   Marital Status: Married  Catering manager Violence:  Not At Risk   Fear of Current or Ex-Partner: No   Emotionally Abused: No   Physically Abused: No   Sexually Abused: No    Review of Systems: All systems reviewed and negative except where noted in HPI.     OBJECTIVE    Physical Exam: Vital signs in last 24 hours: Temp:  [98.2 F (36.8 C)-98.4 F (36.9 C)] 98.3 F (36.8 C) (10/06 0531) Pulse Rate:  [54-114] 55 (10/06 0930) Resp:  [11-18] 11 (10/06 0930) BP: (79-108)/(51-73) 79/51 (10/06 0930) SpO2:  [96 %-100 %] 100 % (10/06 0930)   General:   Alert  female in NAD Psych:  Pleasant, cooperative. Normal mood and affect. Eyes:  Pupils equal, sclera clear, no icterus.   Conjunctiva pink. Ears:  Normal auditory acuity. Nose:  No deformity, discharge,  or lesions. Neck:  Supple; no masses Lungs:  Clear throughout to auscultation.   No wheezes, crackles, or rhonchi.  Heart:  Regular rate and rhythm;  no lower extremity edema Abdomen:  Soft, non-distended, nontender, BS active, no palp mass   Rectal:  Deferred  Msk:  Symmetrical without gross deformities. . Neurologic:  Alert and  oriented x4;  grossly normal neurologically. Skin:  Intact without significant lesions or rashes.   Scheduled inpatient medications  sodium chloride   Intravenous Once   [START ON 12/02/2020] pantoprazole  40 mg Intravenous Q12H      Intake/Output from previous day: No intake/output data recorded. Intake/Output this shift: Total I/O In: 100 [IV Piggyback:100] Out: -    Lab Results: Recent Labs    11/28/20 2319 11/29/20 0525  WBC 7.2  --   HGB 8.6* 8.5*  HCT 26.8* 25.7*  PLT 164  --    BMET Recent Labs    11/28/20 2319  NA 134*  K 4.3  CL 101  CO2 25  GLUCOSE 162*  BUN 50*  CREATININE 0.95   CALCIUM 8.7*   LFT Recent Labs    11/28/20 2319  PROT 5.8*  ALBUMIN 3.5  AST 14*  ALT 12  ALKPHOS 47  BILITOT 0.3   PT/INR No results for input(s): LABPROT, INR in the last 72 hours. Hepatitis Panel No results for input(s): HEPBSAG, HCVAB, HEPAIGM, HEPBIGM in the last 72 hours.   . CBC Latest Ref Rng & Units 11/29/2020 11/28/2020 07/20/2020  WBC 4.0 - 10.5 K/uL - 7.2 4.1  Hemoglobin 12.0 - 15.0 g/dL 8.6(V) 7.8(I) 11.8(L)  Hematocrit 36.0 - 46.0 % 25.7(L) 26.8(L) 36.5  Platelets 150 - 400 K/uL - 164 146(L)    . CMP Latest Ref Rng & Units 11/28/2020 10/19/2020 07/20/2020  Glucose 70 - 99 mg/dL 696(E) 952(W) 413(K)  BUN 6 - 20 mg/dL 44(W) 10(U) 72(Z)  Creatinine 0.44 - 1.00 mg/dL 3.66 4.40(H) 4.74(Q)  Sodium 135 - 145 mmol/L 134(L) 137 136  Potassium 3.5 - 5.1 mmol/L 4.3 4.4 5.0  Chloride 98 - 111 mmol/L 101 102 103  CO2 22 - 32 mmol/L 25 28 29   Calcium 8.9 - 10.3 mg/dL ) 9.2 5.9(D)  Total Protein 6.5 - 8.1 g/dL 6.3(O) - -  Total Bilirubin 0.3 - 1.2 mg/dL 0.3 - -  Alkaline Phos 38 - 126 U/L 47 - -  AST 15 - 41 U/L 14(L) - -  ALT 0 - 44 U/L 12 - -   Studies/Results: No results found.  Principal Problem:   Acute upper GI bleed Active Problems:   Hypothyroidism   PAF (paroxysmal atrial fibrillation) (HCC)   Chronic systolic  CHF (congestive heart failure) (HCC)   Acute on chronic blood loss anemia    Paula Guenther, NP-C @  11/29/2020, 10:23 AM  I have taken a history, reviewed the chart and examined the patient. I performed a substantive portion of this encounter, including complete performance of at least one of the key components, in conjunction with the APP. I agree with the APP's note, impression and recommendations  54 year old female with history of CAD, ischemic cardiomyopathy (EF-25%) with AICD and atrial fibrillation on Eliquis with history of iron deficiency anemia of unclear etiology, presented with several days of weakness and 1.5 days of melenic  stool suggestive of an upper GI bleed.  Mild abdominal discomfort and 2 episodes of nonbloody emesis yesterday and today, otherwise no other recent GI symptoms.  She does take motrin a few times a week for back pain.  Hemoglobin 8.6 down from 11.  BUN significantly elevated at 50.  Hemodynamically stable.  Patient looks well clinically, receiving 1 unit pRBCs.  Last dose of Eliquis yesterday morning.  Melena, acute blood loss anemia - Suspected upper GI bleed from peptic ulcer disease - Plan for EGD tomorrow, allow Eliquis to metabolize - Transfusing one unit now - Clear liquid diet today, NPO after midnight   Lourie Retz E. Eyal Greenhaw, MD [facility]    

## 2020-11-29 NOTE — ED Notes (Signed)
Pt ambulated to the bathroom on her own power, gait even and steady 

## 2020-11-29 NOTE — Progress Notes (Signed)
Patient admitted this morning. She is a 55 year old female with history of chronic A. fib anemia admitted with melena and low hemoglobin in the setting of Eliquis and NSAIDs.  She is followed by Dr. Jena Gauss as an outpatient. She is getting her first unit of packed RBCs today. I have ordered Feraheme.  Continue PPI.  Appreciate GI input.  Plan for EGD Friday.

## 2020-11-29 NOTE — H&P (View-Only) (Signed)
Referring Provider:  [facility]         Primary Care Physician:  Benita Stabile, MD Primary Gastroenterologist:  Dr. Jena Gauss  Reason for Consultation:   anemia                ASSESSMENT / PLAN   # 55 yo female with severe acute on chronic iron deficiency anemia with melena in setting of NSAIDs and Eliquis. Followed by [facility] and Hematology, last IV iron was several months ago. Hgb down from 11.8 in May to 8.6. Prior extensive evaluation a year ago with EGD with small bowel biopsies, colonoscopy and video capsule study was unrevealing. This is apparently the first time she has had overt GI bleeding. Rule out PUD in setting of NSAIDS.  --PRBC infusion in progress --Patient will need EGD tomorrow ( 2 day Eliquis washout) --Continue PPI infusion --Trend hgb, give additional PRBC for hgb < 7.  --Ferritin is pending. 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  See PMH for any additional medical history.   Patient followed by Dr. Jena Gauss and Hematology for iron deficiency anemia  ED course:  Presented to ED yesterday with severe dizziness and dark stool . Hgb 8.6, down from 11.8 in late May. BUN elevated at 50 . Liver chemistries normal. DRE in ED positive for melena.  EKG, in comparison to most recent prior from 10/19/2020 shows sinus bradycardia with heart rate 58, nonspecific intraventricular conduction delay, T wave inversion in aVL,  which is unchanged from most recent prior EKG, no evidence of ST changes, including no evidence of ST elevation.  Stool black over the last couple of days in absence of oral iron or bismuth. She has had a couple of episodes of non-bloody emesis since yesterday. She endorses recent onset of periumbilical pain which is worse with eating. With her history of IDA she hasn't ever had overt bleeding until now. She has been taking Advil a couple of times a week.   PREVIOUS ENDOSCOPIC EVALUATIONS    Sept 2019 Screening colonoscopy  --complete exam, adequate prep --Diverticulosis and hemorrhoids  April 2021 EGD for reflux and iron deficiency anemia --small hiatal hernia --Duodenal biopsies with slight intramucosal Brunner gland hyperplasia, no evidence for sprue.  --Gastric biopsies with antral and oxyntic mucosa with slight chroinc inflammation. No H.pylori on  stain   June 2021 colonoscopy for heme positive, iron deficiency anemia --Adequate prep --TI intubated for 10 cm and appeared normal.  --Left sided diverticulosis, internal hemorrhoids  Sept 2021 Video capsule study Findings: Patient swallowed capsule without difficulty.  Study overall unremarkable.  Tiny erosion noted at 50 minutes and 44 seconds.  Prominent vasculature noted at 2 hours 43 minutes and 41 seconds.  No active bleeding noted on this study.  No masses, ulcers, or findings to explain iron deficiency anemia.  Past Medical History:  Diagnosis Date   AICD (automatic cardioverter/defibrillator) present    [employer]   CAD in native artery    a. late recognition of presentation of STEMI 08/2015 s/p DES to LAD, mild residual mRCA.   Chronic systolic CHF (congestive heart failure) (HCC)    CKD (chronic kidney disease), stage III (HCC)    Hypotension    a. preventing med titration for HF.   Hypothyroidism 09/24/2015   Ischemic cardiomyopathy    Myocardial infarction [facility]) 08/2015   PAF (paroxysmal atrial fibrillation) (HCC)  09/24/2015   a. dx at time of STEMI 08/2015.   Pre-diabetes    Presence of permanent cardiac pacemaker    patient has ICD   PVC's (premature ventricular contractions)     Past Surgical History:  Procedure Laterality Date   BIOPSY  06/16/2019   Procedure: BIOPSY;  Surgeon: Corbin Ade, MD;  Location: [facility];  Service: Endoscopy;;   CARDIAC CATHETERIZATION N/A 09/20/2015   Procedure: Left Heart Cath and Coronary Angiography;  Surgeon: Kathleene Hazel, MD;  Location: [facility] INVASIVE CV LAB;  Service: Cardiovascular;  Laterality: N/A;   CARDIAC CATHETERIZATION N/A 09/20/2015   Procedure: Coronary Stent Intervention;  Surgeon: Kathleene Hazel, MD;  Location: [facility] INVASIVE CV LAB;  Service: Cardiovascular;  Laterality: N/A;   CARDIAC CATHETERIZATION N/A 09/21/2015   Procedure: Left Heart Cath and Coronary Angiography;  Surgeon: Tonny Bollman, MD;  Location: [facility] INVASIVE CV LAB;  Service: Cardiovascular;  Laterality: N/A;   CARDIAC CATHETERIZATION N/A 11/26/2015   Procedure: Right Heart Cath;  Surgeon: [name], MD;  Location: [facility] INVASIVE CV LAB;  Service: Cardiovascular;  Laterality: N/A;   COLONOSCOPY WITH PROPOFOL N/A 11/23/2017   Dr. Jena Gauss: Diverticulosis, internal hemorrhoids, next colonoscopy in 10 years   COLONOSCOPY WITH PROPOFOL N/A 08/10/2019   Procedure: COLONOSCOPY WITH PROPOFOL;  Surgeon: Corbin Ade, MD;  Diverticulosis in sigmoid colon, nonbleeding internal hemorrhoids, otherwise normal exam.     CORONARY STENT PLACEMENT  09/20/2015   EP IMPLANTABLE DEVICE N/A 01/23/2016   Procedure: ICD Implant;  Surgeon: Will Jorja Loa, MD;  Location: [facility] INVASIVE CV LAB;  Service: Cardiovascular;  Laterality: N/A;   ESOPHAGOGASTRODUODENOSCOPY (EGD) WITH PROPOFOL N/A 06/16/2019   Procedure: ESOPHAGOGASTRODUODENOSCOPY (EGD) WITH PROPOFOL;  Surgeon: Corbin Ade, MD;  Normal esophagus, small hiatal hernia, normal examined stomach, normal examined duodenum.  Gastric  biopsy with slight chronic inflammation, duodenal biopsy with slight intramucosal Brunner gland hyperplasia.   GIVENS CAPSULE STUDY N/A 11/23/2019    Surgeon: Corbin Ade, MD; essentially unremarkable except for tiny erosion in the proximal small bowel.   LEFT HEART CATH AND CORONARY ANGIOGRAPHY N/A 12/18/2017   Procedure: LEFT HEART CATH AND CORONARY ANGIOGRAPHY;  Surgeon: [name], MD;  Location: [facility] INVASIVE CV LAB;  Service: Cardiovascular;  Laterality: N/A;   PVC ABLATION N/A 04/02/2017   Procedure: PVC ABLATION;  Surgeon: Regan Lemming, MD;  Location: [facility] INVASIVE CV LAB;  Service: Cardiovascular;  Laterality: N/A;   TEE WITHOUT CARDIOVERSION N/A 10/08/2016   Procedure: TRANSESOPHAGEAL ECHOCARDIOGRAM (TEE);  Surgeon: [name], MD;  Location: [facility] ENDOSCOPY;  Service: Cardiovascular;  Laterality: N/A;   THYROIDECTOMY      Prior to Admission medications   Medication Sig Start Date End Date Taking? Authorizing Provider  acetaminophen (TYLENOL) 500 MG tablet Take 1,000 mg by mouth every 6 (six) hours as needed for mild pain.   Yes [provider]  apixaban (ELIQUIS) 5 MG TABS tablet Take 1 tablet (5 mg total) by mouth 2 (two) times daily. 03/23/20  Yes [name], MD  buPROPion (WELLBUTRIN XL) 300 MG 24 hr tablet Take 300 mg by mouth daily. 05/10/19  Yes [provider]  Calcium Carb-Cholecalciferol (CALCIUM + D3 PO) Take 1 tablet by mouth every morning.   Yes [provider]  Coenzyme Q10 (COQ10) 200 MG CAPS Take 200 mg by mouth daily.   Yes [provider]  dexlansoprazole (DEXILANT) 60 MG capsule Take 1 capsule (60 mg total) by mouth daily. 04/18/20  Yes Letta Median, PA-C  fexofenadine (ALLEGRA) 180 MG tablet Take 180 mg by mouth daily.   Yes [provider]  furosemide (LASIX) 20 MG tablet Take 20 mg by mouth daily. 10/27/20  Yes [provider]  ibuprofen (ADVIL) 200 MG tablet Take 400 mg by mouth every 6  (six) hours as needed for headache or mild pain.   Yes [provider]  losartan (COZAAR) 25 MG tablet Take 25 mg by mouth daily.   Yes [provider]  nitroGLYCERIN (NITROSTAT) 0.4 MG SL tablet PLACE 1 TABLET UNDER THE TONGUE EVERY 5 MINUTES AS NEEDED FOR CHEST PAIN Patient taking differently: Place 0.4 mg under the tongue every 5 (five) minutes as needed for chest pain. 04/13/20  Yes [name], MD  rosuvastatin (CRESTOR) 10 MG tablet TAKE 1 TABLET(10 MG) BY MOUTH DAILY Patient taking differently: Take 10 mg by mouth daily. 05/15/20  Yes [name], MD  sacubitril-valsartan (ENTRESTO) 24-26 MG Take 1 tablet by mouth 2 (two) times daily. 10/19/20  Yes [name], MD  sotalol (BETAPACE) 160 MG tablet TAKE 1 TABLET BY MOUTH EVERY MORNING AND 1/2 TABLET EVERY EVENING Patient taking differently: Take 80-160 mg by mouth 2 (two) times daily. 160mg  in the morning and 80mg  in the evening 10/09/20  Yes , MD  spironolactone (ALDACTONE) 25 MG tablet Take 1 tablet (25 mg total) by mouth daily. 10/23/20  Yes [name], MD  thyroid (ARMOUR) 120 MG tablet Take 120 mg by mouth daily before breakfast.   Yes [provider]  traZODone (DESYREL) 50 MG tablet Take 0.5 tablets (25 mg total) by mouth at bedtime as needed for sleep. 11/26/15  Yes Clegg, Amy D, NP  triamcinolone (NASACORT) 55 MCG/ACT AERO nasal inhaler Place 2 sprays into the nose daily.    Yes [provider]    Current Facility-Administered Medications  Medication Dose Route Frequency Provider Last Rate Last Admin   0.9 %  sodium chloride infusion (Manually program via Guardrails IV Fluids)   Intravenous Once Howerter, Justin B, DO   Held at 11/29/20 01/26/16   acetaminophen (TYLENOL) tablet 650 mg  650 mg Oral Q6H PRN Howerter, Justin B, DO       Or   acetaminophen (TYLENOL) suppository 650 mg  650 mg Rectal Q6H PRN Howerter, Justin B, DO       ondansetron (ZOFRAN) injection 4 mg  4 mg Intravenous Q6H PRN Howerter, Justin B, DO       [START ON 12/02/2020] pantoprazole (PROTONIX) injection 40 mg  40 mg Intravenous Q12H Petrucelli, Samantha R, PA-C       pantoprozole (PROTONIX) 80 mg /NS 100 mL infusion  8 mg/hr Intravenous Continuous Howerter, Justin B, DO 10 mL/hr at 11/29/20 1324 8 mg/hr at 11/29/20 4010   Current Outpatient Medications  Medication Sig Dispense Refill   acetaminophen (TYLENOL) 500 MG tablet Take 1,000 mg by mouth every 6 (six) hours as needed for mild pain.     apixaban (ELIQUIS) 5 MG TABS tablet Take 1 tablet (5 mg total) by mouth 2 (two) times daily. 180 tablet 3   buPROPion (WELLBUTRIN XL) 300 MG 24 hr tablet Take 300 mg by mouth daily.     Calcium Carb-Cholecalciferol (CALCIUM + D3 PO) Take 1 tablet by mouth every morning.     Coenzyme Q10 (COQ10) 200 MG CAPS Take 200 mg by mouth daily.     dexlansoprazole (DEXILANT) 60 MG capsule Take 1 capsule (60 mg total) by mouth daily. 90 capsule 3   fexofenadine (ALLEGRA) 180 MG tablet Take 180 mg by mouth daily.     furosemide (LASIX) 20 MG tablet Take 20 mg by mouth daily.     ibuprofen (ADVIL) 200 MG tablet Take 400 mg by mouth every 6 (six) hours as needed for headache or mild pain.     losartan (COZAAR) 25 MG tablet Take 25 mg by mouth daily.     nitroGLYCERIN (NITROSTAT) 0.4 MG SL tablet PLACE 1 TABLET UNDER THE TONGUE EVERY 5 MINUTES AS NEEDED FOR CHEST PAIN (Patient taking differently: Place 0.4 mg under the tongue every 5 (five) minutes as needed for chest pain.) 25 tablet 3   rosuvastatin (CRESTOR) 10 MG tablet TAKE 1 TABLET(10 MG) BY MOUTH DAILY (Patient taking differently: Take 10 mg by mouth daily.) 90 tablet 3   sacubitril-valsartan (ENTRESTO) 24-26 MG Take 1 tablet by mouth 2 (two) times daily. 60 tablet 11   sotalol (BETAPACE) 160 MG tablet TAKE 1 TABLET BY MOUTH EVERY MORNING AND 1/2 TABLET EVERY EVENING (Patient taking differently: Take 80-160 mg by mouth 2 (two) times daily. 160mg  in the  morning and 80mg  in the evening) 45 tablet 6   spironolactone (ALDACTONE) 25 MG tablet Take 1 tablet (25 mg total) by mouth daily. 90 tablet 1   thyroid (ARMOUR) 120 MG tablet Take 120 mg by mouth daily before breakfast.     traZODone (DESYREL) 50 MG tablet Take 0.5 tablets (25 mg total) by mouth at bedtime as needed for sleep. 30 tablet 0   triamcinolone (NASACORT) 55 MCG/ACT AERO nasal inhaler Place 2 sprays into the nose daily.      Facility-Administered Medications Ordered in Other Encounters  Medication Dose Route Frequency Provider Last Rate Last Admin   bivalirudin (ANGIOMAX) 250 mg in sodium chloride 0.9 % 50 mL (5 mg/mL) infusion    Continuous PRN , MD   Stopped at 09/20/15 0733   bivalirudin (ANGIOMAX) BOLUS via infusion    PRN Kathleene Hazel, MD   54.45 mg at 09/20/15 Kathleene Hazel   fentaNYL (SUBLIMAZE) injection    PRN 09/22/15, MD   25 mcg at 09/20/15 Kathleene Hazel   heparin 1,500 mL    PRN 09/22/15, MD   Given at 09/20/15 0657   heparin infusion 2 units/mL in 0.9 % sodium chloride    Continuous PRN Kathleene Hazel,  MD   1,500 mL at 09/20/15 0733   iopamidol (ISOVUE-370) 76 % injection    PRN Kathleene Hazel, MD   165 mL at 09/20/15 0733   lidocaine (PF) (XYLOCAINE) 1 % injection    PRN Kathleene Hazel, MD   2 mL at 09/20/15 6333   midazolam (VERSED) injection    PRN Kathleene Hazel, MD   1 mg at 09/20/15 5456   nitroGLYCERIN 1 mg/10 ml (100 mcg/ml) - IR/CATH LAB    PRN Kathleene Hazel, MD   200 mcg at 09/20/15 2563   Radial Cocktail/Verapamil only    PRN Kathleene Hazel, MD   10 mL at 09/20/15 8937   ticagrelor (BRILINTA) tablet    PRN Kathleene Hazel, MD   180 mg at 09/20/15 3428   tirofiban (AGGRASTAT) bolus via infusion    PRN Kathleene Hazel, MD   1,815 mcg at 09/20/15 7681   tirofiban (AGGRASTAT) infusion 50 mcg/mL 100 mL    Continuous PRN Kathleene Hazel, MD  13.1 mL/hr at 09/20/15 0651 0.15 mcg/kg/min at 09/20/15 0651    Allergies as of 11/28/2020 - Review Complete 11/28/2020  Allergen Reaction Noted   Paxil [paroxetine] Palpitations 11/23/2015   Morphine and related Nausea And Vomiting 11/23/2015   Percocet [oxycodone-acetaminophen] Nausea And Vomiting 11/23/2015   Cefdinir Nausea Only 11/23/2015   Zolpidem Other (See Comments) 11/23/2015    Family History  Problem Relation Age of Onset   Heart attack Father    Arrhythmia Mother    Lung cancer Mother        bronchiectasis   Colon cancer Neg Hx    Colon polyps Neg Hx     Social History   Socioeconomic History   Marital status: Married    Spouse name: Not on file   Number of children: Not on file   Years of education: Not on file   Highest education level: Not on file  Occupational History   Not on file  Tobacco Use   Smoking status: Never   Smokeless tobacco: Never  Vaping Use   Vaping Use: Never used  Substance and Sexual Activity   Alcohol use: No   Drug use: No   Sexual activity: Not on file  Other Topics Concern   Not on file  Social History Narrative   Not on file   Social Determinants of Health   Financial Resource Strain: Low Risk    Difficulty of Paying Living Expenses: Not hard at all  Food Insecurity: No Food Insecurity   Worried About Programme researcher, broadcasting/film/video in the Last Year: Never true   Barista in the Last Year: Never true  Transportation Needs: No Transportation Needs   Lack of Transportation (Medical): No   Lack of Transportation (Non-Medical): No  Physical Activity: Insufficiently Active   Days of Exercise per Week: 3 days   Minutes of Exercise per Session: 30 min  Stress: No Stress Concern Present   Feeling of Stress : Not at all  Social Connections: Socially Integrated   Frequency of Communication with Friends and Family: More than three times a week   Frequency of Social Gatherings with Friends and Family: More than three times a week    Attends Religious Services: More than 4 times per year   Active Member of [employer] or Organizations: Yes   Attends Engineer, structural: More than 4 times per year   Marital Status: Married  Catering manager Violence:  Not At Risk   Fear of Current or Ex-Partner: No   Emotionally Abused: No   Physically Abused: No   Sexually Abused: No    Review of Systems: All systems reviewed and negative except where noted in HPI.     OBJECTIVE    Physical Exam: Vital signs in last 24 hours: Temp:  [98.2 F (36.8 C)-98.4 F (36.9 C)] 98.3 F (36.8 C) (10/06 0531) Pulse Rate:  [54-114] 55 (10/06 0930) Resp:  [11-18] 11 (10/06 0930) BP: (79-108)/(51-73) 79/51 (10/06 0930) SpO2:  [96 %-100 %] 100 % (10/06 0930)   General:   Alert  female in NAD Psych:  Pleasant, cooperative. Normal mood and affect. Eyes:  Pupils equal, sclera clear, no icterus.   Conjunctiva pink. Ears:  Normal auditory acuity. Nose:  No deformity, discharge,  or lesions. Neck:  Supple; no masses Lungs:  Clear throughout to auscultation.   No wheezes, crackles, or rhonchi.  Heart:  Regular rate and rhythm;  no lower extremity edema Abdomen:  Soft, non-distended, nontender, BS active, no palp mass   Rectal:  Deferred  Msk:  Symmetrical without gross deformities. . Neurologic:  Alert and  oriented x4;  grossly normal neurologically. Skin:  Intact without significant lesions or rashes.   Scheduled inpatient medications  sodium chloride   Intravenous Once   [START ON 12/02/2020] pantoprazole  40 mg Intravenous Q12H      Intake/Output from previous day: No intake/output data recorded. Intake/Output this shift: Total I/O In: 100 [IV Piggyback:100] Out: -    Lab Results: Recent Labs    11/28/20 2319 11/29/20 0525  WBC 7.2  --   HGB 8.6* 8.5*  HCT 26.8* 25.7*  PLT 164  --    BMET Recent Labs    11/28/20 2319  NA 134*  K 4.3  CL 101  CO2 25  GLUCOSE 162*  BUN 50*  CREATININE 0.95   CALCIUM 8.7*   LFT Recent Labs    11/28/20 2319  PROT 5.8*  ALBUMIN 3.5  AST 14*  ALT 12  ALKPHOS 47  BILITOT 0.3   PT/INR No results for input(s): LABPROT, INR in the last 72 hours. Hepatitis Panel No results for input(s): HEPBSAG, HCVAB, HEPAIGM, HEPBIGM in the last 72 hours.   . CBC Latest Ref Rng & Units 11/29/2020 11/28/2020 07/20/2020  WBC 4.0 - 10.5 K/uL - 7.2 4.1  Hemoglobin 12.0 - 15.0 g/dL 8.6(V) 7.8(I) 11.8(L)  Hematocrit 36.0 - 46.0 % 25.7(L) 26.8(L) 36.5  Platelets 150 - 400 K/uL - 164 146(L)    . CMP Latest Ref Rng & Units 11/28/2020 10/19/2020 07/20/2020  Glucose 70 - 99 mg/dL 696(E) 952(W) 413(K)  BUN 6 - 20 mg/dL 44(W) 10(U) 72(Z)  Creatinine 0.44 - 1.00 mg/dL 3.66 4.40(H) 4.74(Q)  Sodium 135 - 145 mmol/L 134(L) 137 136  Potassium 3.5 - 5.1 mmol/L 4.3 4.4 5.0  Chloride 98 - 111 mmol/L 101 102 103  CO2 22 - 32 mmol/L 25 28 29   Calcium 8.9 - 10.3 mg/dL ) 9.2 5.9(D)  Total Protein 6.5 - 8.1 g/dL 6.3(O) - -  Total Bilirubin 0.3 - 1.2 mg/dL 0.3 - -  Alkaline Phos 38 - 126 U/L 47 - -  AST 15 - 41 U/L 14(L) - -  ALT 0 - 44 U/L 12 - -   Studies/Results: No results found.  Principal Problem:   Acute upper GI bleed Active Problems:   Hypothyroidism   PAF (paroxysmal atrial fibrillation) (HCC)   Chronic systolic  CHF (congestive heart failure) (HCC)   Acute on chronic blood loss anemia    Willette Cluster, NP-C @  11/29/2020, 10:23 AM  I have taken a history, reviewed the chart and examined the patient. I performed a substantive portion of this encounter, including complete performance of at least one of the key components, in conjunction with the APP. I agree with the APP's note, impression and recommendations  55 year old female with history of CAD, ischemic cardiomyopathy (EF-25%) with AICD and atrial fibrillation on Eliquis with history of iron deficiency anemia of unclear etiology, presented with several days of weakness and 1.5 days of melenic  stool suggestive of an upper GI bleed.  Mild abdominal discomfort and 2 episodes of nonbloody emesis yesterday and today, otherwise no other recent GI symptoms.  She does take motrin a few times a week for back pain.  Hemoglobin 8.6 down from 11.  BUN significantly elevated at 50.  Hemodynamically stable.  Patient looks well clinically, receiving 1 unit pRBCs.  Last dose of Eliquis yesterday morning.  Melena, acute blood loss anemia - Suspected upper GI bleed from peptic ulcer disease - Plan for EGD tomorrow, allow Eliquis to metabolize - Transfusing one unit now - Clear liquid diet today, NPO after midnight   Finian Helvey E. Tomasa Rand, MD [facility]

## 2020-11-29 NOTE — H&P (Signed)
History and Physical    PLEASE NOTE THAT DRAGON DICTATION SOFTWARE WAS USED IN THE CONSTRUCTION OF THIS NOTE.   Catherine Mays WGN:[id number] DOB: [birth year] DOA: 11/28/2020  PCP: Benita Stabile, MD Patient coming from: home   I have personally briefly reviewed patient's old medical records in [facility]  Chief Complaint: dizziness   HPI: Catherine Mays is a 55 y.o. 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   The patient reports 1 day of dizziness in the absence of any syncope or vertigo.  She notes that this is been associated by 3 days of dark-colored stool, which is new for her.  [name] reports 1 episode of nausea resulting in an episode of nonbloody, nonbilious emesis over the last time.  She also reports no associated coffee-ground appearance with this episode of emesis.  Denies any associated hematochezia.  Denies any recent abdominal pain, loose stool, or any preceding trauma.  She notes that her dizziness has not been associate with any shortness of breath, chest pain, palpitations, diaphoresis.  No recent trauma or any recent subjective fever, chills, rigors, or generalized myalgias.  Denies any recent rash, dysuria, gross materia, or change in urinary urgency/frequency.  Denies any recent cough or hemoptysis.  No recent worsening of edema in the bilateral lower extremities.  The patient reports that she had a suspected gastrointestinal bleed 2021, for which she underwent EGD in April 2021 as well as colonoscopy in June 2021.  Denies any known issues with gastrointestinal bleeding in the interval leading up to the  last few days.  Denies any regular or recent consumption of alcohol, denies any known history of underlying chronic liver disease.  In the setting of paroxysmal atrial fibrillation, she is chronically anticoagulated on Eliquis, with most recent such dose occurring on 11/28/2020.  Otherwise, she denies any use of blood thinners as an outpatient, including no aspirin. Also denies taking any of the following oral medications at home: systemic corticosteroids, supplemental potassium, supplemental iron, or bisphosphonate medications.  The setting of prior gastrointestinal bleed, it appears that the patient is on daily Dexilant.   Per chart review, it appears that patient's baseline hemoglobin is 10-12, with most recent prior hemoglobin noted to be 11.8 on 07/20/2020.   She has a history of chronic systolic/diastolic heart failure, with most recent echocardiogram in January 2022 demonstrating LVEF 25 to 30%, mildly dilated left ventricular internal cavity, grade 2 diastolic dysfunction, and no evidence of significant valvular pathology.  Associated medical management includes Entresto and spironolactone.  In the context of her history of paroxysmal atrial fibrillation she is also on sotalol.      ED Course:  Vital signs in the ED were notable for the following: Temperature max 98.4, heart rate 63-65; blood pressure 96/62 -108/73; respiratory rate 15-18, oxygen saturation 96 to 100% on room air.  Labs were notable for the following: CMP notable for the following BUN 58 compared to most recent prior value of 29 on 10/19/2020, creatinine 0.95 compared to 1.1 on 10/19/2020, and liver enzymes were found to be within normal limits.  CBC notable for white blood cell count 7200, hemoglobin 8.6 initially and associated with normocytic/normochromic findings as well as nonelevated  RDW, with repeat H&H drawn 6 hours later noted to be 8.5, platelet count 164.  Type and screen was completed in the ED this evening.  Rectal exam  revealed dark-colored stool that was found to be fecal occult positive in the absence of any hematochezia.  Screening COVID-19 PCR was performed in the ED this evening, with result currently pending.  Imaging and additional notable ED work-up: EKG, in comparison to most recent prior from 10/19/2020 shows sinus bradycardia with heart rate 58, nonspecific intraventricular conduction delay, T wave inversion in aVL, which is unchanged from most recent prior EKG, no evidence of ST changes, including no evidence of ST elevation.  EDP consulted the on-call gastroenterologist, with additional recommendations pending at this time.  While in the ED, the following were administered: Protonix 80 mg IV x1 followed by initiation of Protonix drip.  Subsequently, the patient was admitted for overnight observation to the PCU for further evaluation and management of suspected acute upper gastrointestinal bleed with associated acute on chronic blood loss anemia.     Review of Systems: As per HPI otherwise 10 point review of systems negative.   Past Medical History:  Diagnosis Date   AICD (automatic cardioverter/defibrillator) present    [employer] Jude   CAD in native artery    a. late recognition of presentation of STEMI 08/2015 s/p DES to LAD, mild residual mRCA.   Chronic systolic CHF (congestive heart failure) (HCC)    CKD (chronic kidney disease), stage III (HCC)    Hypotension    a. preventing med titration for HF.   Hypothyroidism 09/24/2015   Ischemic cardiomyopathy    Myocardial infarction [facility]) 08/2015   PAF (paroxysmal atrial fibrillation) (HCC) 09/24/2015   a. dx at time of STEMI 08/2015.   Pre-diabetes    Presence of permanent cardiac pacemaker    patient has ICD   PVC's (premature ventricular contractions)     Past Surgical History:  Procedure Laterality Date   BIOPSY  06/16/2019   Procedure: BIOPSY;  Surgeon: Corbin Ade, MD;  Location: [facility];  Service: Endoscopy;;   CARDIAC  CATHETERIZATION N/A 09/20/2015   Procedure: Left Heart Cath and Coronary Angiography;  Surgeon: Kathleene Hazel, MD;  Location: [facility] INVASIVE CV LAB;  Service: Cardiovascular;  Laterality: N/A;   CARDIAC CATHETERIZATION N/A 09/20/2015   Procedure: Coronary Stent Intervention;  Surgeon: Kathleene Hazel, MD;  Location: [facility] INVASIVE CV LAB;  Service: Cardiovascular;  Laterality: N/A;   CARDIAC CATHETERIZATION N/A 09/21/2015   Procedure: Left Heart Cath and Coronary Angiography;  Surgeon: Tonny Bollman, MD;  Location: [facility] INVASIVE CV LAB;  Service: Cardiovascular;  Laterality: N/A;   CARDIAC CATHETERIZATION N/A 11/26/2015   Procedure: Right Heart Cath;  Surgeon: [name], MD;  Location: [facility] INVASIVE CV LAB;  Service: Cardiovascular;  Laterality: N/A;   COLONOSCOPY WITH PROPOFOL N/A 11/23/2017   Dr. Jena Gauss: Diverticulosis, internal hemorrhoids, next colonoscopy in 10 years   COLONOSCOPY WITH PROPOFOL N/A 08/10/2019   Procedure: COLONOSCOPY WITH PROPOFOL;  Surgeon: Corbin Ade, MD;  Diverticulosis in sigmoid colon, nonbleeding internal hemorrhoids, otherwise normal exam.     CORONARY STENT PLACEMENT  09/20/2015   EP IMPLANTABLE DEVICE N/A 01/23/2016   Procedure: ICD Implant;  Surgeon: Will Jorja Loa, MD;  Location: [facility] INVASIVE CV LAB;  Service: Cardiovascular;  Laterality: N/A;   ESOPHAGOGASTRODUODENOSCOPY (EGD) WITH PROPOFOL N/A 06/16/2019   Procedure: ESOPHAGOGASTRODUODENOSCOPY (EGD) WITH PROPOFOL;  Surgeon: Corbin Ade, MD;  Normal esophagus, small hiatal hernia, normal  examined stomach, normal examined duodenum.  Gastric biopsy with slight chronic inflammation, duodenal biopsy with slight intramucosal Brunner gland hyperplasia.   GIVENS CAPSULE STUDY N/A 11/23/2019    Surgeon: Corbin Ade, MD; essentially unremarkable except for tiny erosion in the proximal small bowel.   LEFT HEART CATH AND CORONARY ANGIOGRAPHY N/A 12/18/2017   Procedure: LEFT HEART CATH AND CORONARY  ANGIOGRAPHY;  Surgeon: [name], MD;  Location: [facility] INVASIVE CV LAB;  Service: Cardiovascular;  Laterality: N/A;   PVC ABLATION N/A 04/02/2017   Procedure: PVC ABLATION;  Surgeon: Regan Lemming, MD;  Location: [facility] INVASIVE CV LAB;  Service: Cardiovascular;  Laterality: N/A;   TEE WITHOUT CARDIOVERSION N/A 10/08/2016   Procedure: TRANSESOPHAGEAL ECHOCARDIOGRAM (TEE);  Surgeon: [name], MD;  Location: [facility] ENDOSCOPY;  Service: Cardiovascular;  Laterality: N/A;   THYROIDECTOMY      Social History:  reports that she has never smoked. She has never used smokeless tobacco. She reports that she does not drink alcohol and does not use drugs.   Allergies  Allergen Reactions   Paxil [Paroxetine] Palpitations   Morphine And Related Nausea And Vomiting   Percocet [Oxycodone-Acetaminophen] Nausea And Vomiting   Cefdinir Nausea Only   Zolpidem Other (See Comments)    Doesn't work for patient at all    Family History  Problem Relation Age of Onset   Heart attack Father    Arrhythmia Mother    Lung cancer Mother        bronchiectasis   Colon cancer Neg Hx    Colon polyps Neg Hx     Family history reviewed and not pertinent    Prior to Admission medications   Medication Sig Start Date End Date Taking? Authorizing Provider  apixaban (ELIQUIS) 5 MG TABS tablet Take 1 tablet (5 mg total) by mouth 2 (two) times daily. 03/23/20   [name], MD  buPROPion (WELLBUTRIN XL) 300 MG 24 hr tablet Take 300 mg by mouth daily. 05/10/19   [provider]  Calcium Carb-Cholecalciferol (CALCIUM + D3 PO) Take 1 tablet by mouth every morning.    [provider]  Coenzyme Q10 (COQ10) 200 MG CAPS Take 200 mg by mouth daily.    [provider]  dexlansoprazole (DEXILANT) 60 MG capsule Take 1 capsule (60 mg total) by mouth daily. 04/18/20   Letta Median, PA-C  fexofenadine (ALLEGRA) 180 MG tablet Take 180 mg by mouth daily.    [provider]   nitroGLYCERIN (NITROSTAT) 0.4 MG SL tablet PLACE 1 TABLET UNDER THE TONGUE EVERY 5 MINUTES AS NEEDED FOR CHEST PAIN 04/13/20   [name], MD  rosuvastatin (CRESTOR) 10 MG tablet TAKE 1 TABLET(10 MG) BY MOUTH DAILY 05/15/20   [name], MD  sacubitril-valsartan (ENTRESTO) 24-26 MG Take 1 tablet by mouth 2 (two) times daily. 10/19/20   [name], MD  sotalol (BETAPACE) 160 MG tablet TAKE 1 TABLET BY MOUTH EVERY MORNING AND 1/2 TABLET EVERY EVENING 10/09/20   [name], MD  spironolactone (ALDACTONE) 25 MG tablet Take 1 tablet (25 mg total) by mouth daily. 10/23/20   [name], MD  thyroid (ARMOUR) 120 MG tablet Take 120 mg by mouth daily before breakfast.    [provider]  traZODone (DESYREL) 50 MG tablet Take 0.5 tablets (25 mg total) by mouth at bedtime as needed for sleep. 11/26/15   Clegg, Amy D, NP  triamcinolone (NASACORT) 55 MCG/ACT AERO nasal inhaler Place 2  sprays into the nose daily.     [provider]     Objective    Physical Exam: Vitals:   11/28/20 2308 11/29/20 0100 11/29/20 0318 11/29/20 0531  BP: 96/62 (!) 87/66 (!) 90/54 108/73  Pulse: (!) 114 65 (!) 54 63  Resp: 16 15 16 18   Temp: 98.4 F (36.9 C) 98.2 F (36.8 C)  98.3 F (36.8 C)  TempSrc: Oral Oral  Oral  SpO2: 100% 96% 100% 100%    General: appears to be stated age; alert, oriented Skin: warm, dry, no rash Head:  AT/[place] Mouth:  Oral mucosa membranes appear dry, normal dentition Neck: supple; trachea midline Heart:  RRR; did not appreciate any M/R/G Lungs: CTAB, did not appreciate any wheezes, rales, or rhonchi Abdomen: + BS; soft, ND, NT Vascular: 2+ pedal pulses b/l; 2+ radial pulses b/l Extremities: trace edema in b/l LE's; no muscle wasting Neuro: strength and sensation intact in upper and lower extremities b/l    Labs on Admission: I have personally reviewed following labs and imaging studies  CBC: Recent Labs  Lab 11/28/20 2319 11/29/20 0525   WBC 7.2  --   HGB 8.6* 8.5*  HCT 26.8* 25.7*  MCV 93.4  --   PLT 164  --    Basic Metabolic Panel: Recent Labs  Lab 11/28/20 2319  NA 134*  K 4.3  CL 101  CO2 25  GLUCOSE 162*  BUN 50*  CREATININE 0.95  CALCIUM 8.7*   GFR: CrCl cannot be calculated (Unknown ideal weight.). Liver Function Tests: Recent Labs  Lab 11/28/20 2319  AST 14*  ALT 12  ALKPHOS 47  BILITOT 0.3  PROT 5.8*  ALBUMIN 3.5   No results for input(s): LIPASE, AMYLASE in the last 168 hours. No results for input(s): AMMONIA in the last 168 hours. Coagulation Profile: No results for input(s): INR, PROTIME in the last 168 hours. Cardiac Enzymes: No results for input(s): CKTOTAL, CKMB, CKMBINDEX, TROPONINI in the last 168 hours. BNP (last 3 results) No results for input(s): PROBNP in the last 8760 hours. HbA1C: No results for input(s): HGBA1C in the last 72 hours. CBG: No results for input(s): GLUCAP in the last 168 hours. Lipid Profile: No results for input(s): CHOL, HDL, LDLCALC, TRIG, CHOLHDL, LDLDIRECT in the last 72 hours. Thyroid Function Tests: No results for input(s): TSH, T4TOTAL, FREET4, T3FREE, THYROIDAB in the last 72 hours. Anemia Panel: No results for input(s): VITAMINB12, FOLATE, FERRITIN, TIBC, IRON, RETICCTPCT in the last 72 hours. Urine analysis: No results found for: COLORURINE, APPEARANCEUR, LABSPEC, PHURINE, GLUCOSEU, HGBUR, BILIRUBINUR, KETONESUR, PROTEINUR, UROBILINOGEN, NITRITE, LEUKOCYTESUR  Radiological Exams on Admission: No results found.   EKG: Independently reviewed, with result as described above.    Assessment/Plan   Catherine Mays is a 55 y.o. female with medical history significant for chronic combined systolic/diastolic heart failure with LV EF 25 to 30% status post AICD, paroxysmal atrial fibrillation chronically anticoagulated on Eliquis, chronic anemia with baseline hemoglobin 10-12, who is admitted to [facility] on 11/28/2020 with suspected acute  upper gastrointestinal bleed after presenting from home to [facility] ED complaining of dizziness.    Principal Problem:   Acute upper GI bleed Active Problems:   Hypothyroidism   PAF (paroxysmal atrial fibrillation) (HCC)   Chronic systolic CHF (congestive heart failure) (HCC)   Acute on chronic blood loss anemia     #) Acute Upper GI Bleed: diagnosis on the basis of presenting 3 days of melena with evidence of dark-colored stool  on rectal exam performed in the ED today and associated with fecal occult positive finding, with elevated BUN of 50 compared to most recent prior value of 29.  In setting of paroxysmal atrial fibrillation, she is chronically anticoagulated on Eliquis, with most recent prior dose occurring on 11/28/2020.  Otherwise, no additional blood thinners as an outpatient, including no aspirin.  Denies any NSAID use.  No known history of known underlying liver disease, and denies any history of alcohol abuse or recent alcohol consumption.  Appears to have experienced an acute gastrointestinal bleed in 2021, with EGD performed in April 2021 as well as colonoscopy in June 2021, evidence of gastrointestinal bleed leading up to this evening's presentation.  She is on daily Dexilant at home.  In the absence of known liver disease, initiation of SBP prophylaxis does not appear to be warranted. Presentation and history are less suggestive of variceal bleed, and therefore there does not appear to be an indication for octreotide. At this time ddx broadly includes, but is not limited to gastritis vs erosive esophagitis vs PUD (gastric versus duodenal distribution) vs Dieulafoy lesion vs AVM.    At this time, the patient appears hemodynamically stable, with normotensive blood pressures in the absence of any associated tachycardia, will acknowledging that baseline blood pressures run slightly soft, with associated systolics in the low 100s mmHg, in the setting of chronic combined heart failure with  associated LVEF 25 to 30%.   Presentation appears to be associated with acute on chronic blood loss anemia, as further described below, with presenting hemoglobin noted to be 8.6 relative to baseline range of 10-12, as well as most recent prior hemoglobin value of 11.8 in May 2022.  In the context of suspected upper gastrointestinal source for patient's acute GI bleed, with the patient noted to be asymptomatic by way of her presenting dizziness, with a greater than three-point drop in interval hemoglobin compared to most recent prior value in May 2022, will initiate slow transfusion of 1 unit PRBC over 3 hours, with very close monitoring of ensuing evidence of volume overload given the patient's history of chronic combined heart failure.  of note, patient was typed and screened in the ED today. Given suspected upper GI source, will initiate Protonix drip.  EDP consulted on-call gastroenterologist, with additional recommendations pending at this time.      Plan: NPO. Refraining from pharmacologic DVT prophylaxis. Monitor on telemetry. Monitor continuous pulse-ox. Maintain at least 2 large bore IV's.  Add on INR in the AM. Q4H H&H's have been ordered through 1400 on 11/29/2020.  Initiate slow transfusion of 1 unit PRBC over 3 hours, with next H&H ordered to be checked approximately 30 minutes following the completion of transfusion of 1 unit.  Additionally, order Lasix 20 mg IV x1 to be administered following transfusion of this 1 unit PRBC.  Gastroenterology consulted, as above, with additional recommendations pending at this time.  Protonix drip, as above.  Holding home Eliquis.  Further evaluation and management of acute on chronic blood loss anemia, as further detailed below.     #) Acute on chronic blood loss anemia: in the setting of suspected acute upper GI bleed, presenting Hgb noted to be 8.6 relative to baseline range of 10-12, with most recent prior value of 11.8 in May 2022.  At this time,  patient appears hemodynamically stable relative to her baseline soft blood pressures, as further detailed above, although she does appear mildly symptomatic in the form of her presenting dizziness.  In correlating the upper gastrointestinal source of her presenting acute GI bleed as well as the symptomatic nature with which she presents in in the context of a greater than three-point interval drop in hemoglobin relative to May 2022, the decision has been made to initiate slow transfusion of 1 unit PRBC, as above.    Plan: work-up and management for presenting suspected acute upper GI bleed, as above, including close monitoring of Q4H H&H's.  Transfusion of 1 unit PRBC over 3 hours, as above with dose of IV Lasix afterward, as above.  Monitor on telemetry. Monitor continuous pulse-ox. NPO. Refraining from pharmacologic DVT prophylaxis.  Holding home Eliquis for now.  Add on INR in the morning.  Add on the following to initial lab specimen collected in the ED today: total iron, TIBC, ferritin, MMA, folic acid level, reticulocyte count. Gastroenterology consulted, as above.       #) Chronic combined systolic and diastolic heart failure: documented history of such, with most recent echocardiogram performed in January 2022, as further detailed above, including demonstrating LVEF 25 to 30% as well as grade 2 diastolic dysfunction.  Status post AICD.  Etiology: Ischemic cardiomyopathy. No clinical evidence to suggest acutely decompensated heart failure at this time, although she is certainly at risk for ensuing development of such, both on the basis of acute on chronic anemia as well as ensuing treatment modality to manage such, including plan for transfusion of 1 unit PBC. Patient's home diuretic regimen reportedly consists of the following: Spironolactone. Home cardiac medications also include the following: Entresto.   Plan: monitor strict I's & O's and daily weights. Check serum magnesium level.  Holding  home spironolactone for now.  Holding home [place] for now.  Monitor on telemetry.  Monitor continuous pulse oximetry.  Further evaluation management of acute on chronic anemia in the setting of acute upper gastrointestinal bleed, as above.      #) Paroxysmal atrial fibrillation: Documented history of such. In the setting of a CHA2DS2-VASc score of > 2, there is an indication for the patient to be on chronic anticoagulation for thromboembolic prophylaxis. Consistent with this, the patient is chronically anticoagulated on Eliquis.  On sotalol as an outpatient.  Most recent echocardiogram occurred in January 2022, as further detailed above. Presenting EKG suggesting sinus rhythm.    Plan: monitor strict I's & O's and daily weights. Repeat BMP and CBC in the morning. Check serum magnesium level.  In the setting of current n.p.o. status in context of presenting acute upper gastrointestinal bleed, will hold home sotalol and Eliquis for now.  Monitor on telemetry.      #) Acquired hypothyroidism: documented history of such, on Armour thyroid supplementation as an outpatient.  Plan: In the setting of current n.p.o. status, hold home Armour Thyroid for now.     DVT prophylaxis: SCDs Code Status: Full code Family Communication: none Disposition Plan: Per Rounding Team Consults called: on-call GI notified, as further described above;   Admission status: Observation; PCU   Of note, this patient was added by me to the following Admit List/Treatment Team:  mcadmits.   Of note, the Adult Admission Order Set (Multimorbid order set) was used by me in the admission process for this patient.  PLEASE NOTE THAT DRAGON DICTATION SOFTWARE WAS USED IN THE CONSTRUCTION OF THIS NOTE.   Angie Fava DO [facility] Pager [phone] From 7PM - 7AM   11/29/2020, 6:15 AM

## 2020-11-29 NOTE — ED Notes (Signed)
Called blood bank to check on status of blood. Was told that it is now ready.

## 2020-11-30 ENCOUNTER — Encounter (HOSPITAL_COMMUNITY)
Admission: EM | Disposition: A | Discharge status: Home / Self Care | Payer: Self-pay | Source: Home / Self Care | Attending: Internal Medicine

## 2020-11-30 ENCOUNTER — Observation Stay (HOSPITAL_COMMUNITY): Payer: BC Managed Care – PPO | Admitting: Certified Registered"

## 2020-11-30 DIAGNOSIS — K922 Gastrointestinal hemorrhage, unspecified: Secondary | ICD-10-CM | POA: Diagnosis not present

## 2020-11-30 DIAGNOSIS — K317 Polyp of stomach and duodenum: Secondary | ICD-10-CM | POA: Diagnosis not present

## 2020-11-30 HISTORY — PX: GIVENS CAPSULE STUDY: SHX5432

## 2020-11-30 HISTORY — PX: ESOPHAGOGASTRODUODENOSCOPY (EGD) WITH PROPOFOL: SHX5813

## 2020-11-30 LAB — FOLATE: Folate: 22 ng/mL (ref >5.9)

## 2020-11-30 LAB — HEMOGLOBIN AND HEMATOCRIT, BLOOD
HCT: 24.1 % — ABNORMAL LOW (ref 36.0–46.0)
Hemoglobin: 8 g/dL — ABNORMAL LOW (ref 12.0–15.0)

## 2020-11-30 LAB — IRON AND TIBC
Iron: 281 ug/dL — ABNORMAL HIGH (ref 28–170)
Saturation Ratios: 87 % — ABNORMAL HIGH (ref 10.4–31.8)
TIBC: 325 ug/dL (ref 250–450)
UIBC: 44 ug/dL

## 2020-11-30 LAB — HIV ANTIBODY (ROUTINE TESTING W REFLEX): HIV Screen 4th Generation wRfx: NONREACTIVE

## 2020-11-30 LAB — MAGNESIUM: Magnesium: 1.9 mg/dL (ref 1.7–2.4)

## 2020-11-30 LAB — PROTIME-INR
INR: 1.2 (ref 0.8–1.2)
Prothrombin Time: 15 seconds (ref 11.4–15.2)

## 2020-11-30 LAB — FERRITIN: Ferritin: 70 ng/mL (ref 11–307)

## 2020-11-30 SURGERY — ESOPHAGOGASTRODUODENOSCOPY (EGD) WITH PROPOFOL
Anesthesia: Monitor Anesthesia Care

## 2020-11-30 MED ORDER — SOTALOL HCL 80 MG PO TABS
80.0000 mg | ORAL_TABLET | ORAL | Status: DC
  Administered 2020-11-30 – 2020-12-03 (×4): 80 mg via ORAL
  Filled 2020-11-30 (×5): qty 1

## 2020-11-30 MED ORDER — SPIRONOLACTONE 25 MG PO TABS
25.0000 mg | ORAL_TABLET | Freq: Every day | ORAL | Status: DC
  Administered 2020-11-30 – 2020-12-03 (×4): 25 mg via ORAL
  Filled 2020-11-30 (×4): qty 1

## 2020-11-30 MED ORDER — PROPOFOL 10 MG/ML IV BOLUS
INTRAVENOUS | Status: DC | PRN
  Administered 2020-11-30: 20 mg via INTRAVENOUS

## 2020-11-30 MED ORDER — THYROID 60 MG PO TABS
120.0000 mg | ORAL_TABLET | Freq: Every day | ORAL | Status: DC
  Administered 2020-11-30 – 2020-12-04 (×5): 120 mg via ORAL
  Filled 2020-11-30 (×2): qty 2
  Filled 2020-11-30: qty 1
  Filled 2020-11-30 (×4): qty 2

## 2020-11-30 MED ORDER — PROPOFOL 500 MG/50ML IV EMUL
INTRAVENOUS | Status: DC | PRN
  Administered 2020-11-30: 125 ug/kg/min via INTRAVENOUS

## 2020-11-30 MED ORDER — SACUBITRIL-VALSARTAN 24-26 MG PO TABS
1.0000 | ORAL_TABLET | Freq: Two times a day (BID) | ORAL | Status: DC
  Administered 2020-11-30 – 2020-12-03 (×7): 1 via ORAL
  Filled 2020-11-30 (×8): qty 1

## 2020-11-30 MED ORDER — PHENYLEPHRINE 40 MCG/ML (10ML) SYRINGE FOR IV PUSH (FOR BLOOD PRESSURE SUPPORT)
PREFILLED_SYRINGE | INTRAVENOUS | Status: DC | PRN
  Administered 2020-11-30: 120 ug via INTRAVENOUS

## 2020-11-30 MED ORDER — SOTALOL HCL 80 MG PO TABS: 80.0000 mg | ORAL_TABLET | Freq: Two times a day (BID) | ORAL | Status: DC

## 2020-11-30 MED ORDER — LACTATED RINGERS IV SOLN: INTRAVENOUS | Status: DC | PRN

## 2020-11-30 MED ORDER — LACTATED RINGERS IV SOLN: Freq: Once | INTRAVENOUS | Status: AC

## 2020-11-30 MED ORDER — SOTALOL HCL 80 MG PO TABS
160.0000 mg | ORAL_TABLET | ORAL | Status: DC
  Administered 2020-11-30 – 2020-12-04 (×5): 160 mg via ORAL
  Filled 2020-11-30 (×6): qty 2

## 2020-11-30 MED ORDER — ROSUVASTATIN CALCIUM 5 MG PO TABS
10.0000 mg | ORAL_TABLET | Freq: Every day | ORAL | Status: DC
  Administered 2020-11-30 – 2020-12-04 (×5): 10 mg via ORAL
  Filled 2020-11-30 (×5): qty 2

## 2020-11-30 MED ORDER — PHENYLEPHRINE HCL-NACL 20-0.9 MG/250ML-% IV SOLN
INTRAVENOUS | Status: DC | PRN
  Administered 2020-11-30: 50 ug/min via INTRAVENOUS

## 2020-11-30 MED ORDER — BUPROPION HCL ER (XL) 150 MG PO TB24
300.0000 mg | ORAL_TABLET | Freq: Every day | ORAL | Status: DC
  Administered 2020-11-30 – 2020-12-04 (×5): 300 mg via ORAL
  Filled 2020-11-30 (×5): qty 2

## 2020-11-30 NOTE — Progress Notes (Signed)
PROGRESS NOTE    Catherine Mays  PPI:[id number] DOB: 08-07-1965 DOA: 11/28/2020 PCP: Benita Stabile, MD   Brief Narrative: HPI per Dr. Glade Nurse Ehler is a 55 y.o. female with medical history significant for chronic combined systolic/diastolic heart failure with LV EF 25 to 30% status post AICD, paroxysmal atrial fibrillation chronically anticoagulated on Eliquis, chronic anemia with baseline hemoglobin 10-12, who is admitted to [facility] on 11/28/2020 with suspected acute upper gastrointestinal bleed after presenting from home to [facility] ED complaining of dizziness.    The patient reports 1 day of dizziness in the absence of any syncope or vertigo.  She notes that this is been associated by 3 days of dark-colored stool, which is new for her.  [name] reports 1 episode of nausea resulting in an episode of nonbloody, nonbilious emesis over the last time.  She also reports no associated coffee-ground appearance with this episode of emesis.  Denies any associated hematochezia.  Denies any recent abdominal pain, loose stool, or any preceding trauma.  She notes that her dizziness has not been associate with any shortness of breath, chest pain, palpitations, diaphoresis.  No recent trauma or any recent subjective fever, chills, rigors, or generalized myalgias.  Denies any recent rash, dysuria, gross materia, or change in urinary urgency/frequency.  Denies any recent cough or hemoptysis.  No recent worsening of edema in the bilateral lower extremities.   The patient reports that she had a suspected gastrointestinal bleed 2021, for which she underwent EGD in April 2021 as well as colonoscopy in June 2021.  Denies any known issues with gastrointestinal bleeding in the interval leading up to the last few days.  Denies any regular or recent consumption of alcohol, denies any known history of underlying chronic liver disease.  In the setting of paroxysmal atrial fibrillation, she is chronically  anticoagulated on Eliquis, with most recent such dose occurring on 11/28/2020.  Otherwise, she denies any use of blood thinners as an outpatient, including no aspirin. Also denies taking any of the following oral medications at home: systemic corticosteroids, supplemental potassium, supplemental iron, or bisphosphonate medications.  The setting of prior gastrointestinal bleed, it appears that the patient is on daily Dexilant.    Per chart review, it appears that patient's baseline hemoglobin is 10-12, with most recent prior hemoglobin noted to be 11.8 on 07/20/2020.    She has a history of chronic systolic/diastolic heart failure, with most recent echocardiogram in January 2022 demonstrating LVEF 25 to 30%, mildly dilated left ventricular internal cavity, grade 2 diastolic dysfunction, and no evidence of significant valvular pathology.  Associated medical management includes Entresto and spironolactone.  In the context of her history of paroxysmal atrial fibrillation she is also on sotalol.   Assessment & Plan:   Principal Problem:   Acute upper GI bleed Active Problems:   Hypothyroidism   PAF (paroxysmal atrial fibrillation) (HCC)   Chronic systolic CHF (congestive heart failure) (HCC)   Acute on chronic blood loss anemia   GI bleed  #1 upper GI bleed patient admitted with complaints of melena for 3 days prior to admission to the hospital in the setting of Eliquis for A. fib.  She denied use of NSAIDs or alcohol. Seen by GI EGD today-shows no obvious source of bleeding other than duodenal mucosa friable.  GI recommends a tagged RBC scan or capsule endoscopy if further bleeding occurs continue to hold Eliquis today. She is status post 1 unit blood transfusion and Feraheme Continue Protonix drip  and follow GI recommendations. ?  On Rocephin for SBP prophylaxis?  Will DC if GI does not think she needs it. On admit her hemoglobin was 8.6 which is down from 11.8 in May 2022.  #2 paroxysmal atrial  fibrillation sotalol restarted. Eliquis on hold.  #3 hypothyroidism continue Armour Thyroid  #4 anxiety/depression on Wellbutrin continue  Estimated body mass index is 22.09 kg/m as calculated from the following:   Height as of 04/18/20: 5\' 9"  (1.753 m).   Weight as of 10/19/20: 67.9 kg.  DVT prophylaxis: SCD Code Status: full Family Communication: dw husband Disposition Plan:  Status is: Observation  The patient remains OBS appropriate and will d/c before 2 midnights.  Dispo: The patient is from: Home              Anticipated d/c is to: Home              Patient currently is not medically stable to d/c.   Difficult to place patient Yes   Consultants: gi  Procedures: egd 10/7 Antimicrobials: none  Subjective: No further bowel movements nausea vomiting earlier when I saw her But husband reported later she had vomiting and had black loose stools   Objective: Vitals:   11/30/20 0337 11/30/20 0755 11/30/20 1209 11/30/20 1344  BP: 96/71 106/71 (!) 91/59 (!) 80/58  Pulse:  96 75 77  Resp:  13 11 20   Temp: 97.8 F (36.6 C) 98 F (36.7 C) 97.7 F (36.5 C) 97.7 F (36.5 C)  TempSrc: Oral Oral Temporal Temporal  SpO2:  99% 100% 99%    Intake/Output Summary (Last 24 hours) at 11/30/2020 1355 Last data filed at 11/30/2020 1334 Gross per 24 hour  Intake 600 ml  Output --  Net 600 ml   There were no vitals filed for this visit.  Examination:  General exam: Appears calm and comfortable  Respiratory system: Clear to auscultation. Respiratory effort normal. Cardiovascular system: S1 & S2 heard, RRR. No JVD, murmurs, rubs, gallops or clicks. No pedal edema. Gastrointestinal system: Abdomen is nondistended, soft and nontender. No organomegaly or masses felt. Normal bowel sounds heard. Central nervous system: Alert and oriented. No focal neurological deficits. Extremities: Symmetric 5 x 5 power. Skin: No rashes, lesions or ulcers Psychiatry: Judgement and insight appear  normal. Mood & affect appropriate.     Data Reviewed: I have personally reviewed following labs and imaging studies  CBC: Recent Labs  Lab 11/28/20 2319 11/29/20 0525 11/30/20 0100  WBC 7.2  --   --   HGB 8.6* 8.5* 8.0*  HCT 26.8* 25.7* 24.1*  MCV 93.4  --   --   PLT 164  --   --    Basic Metabolic Panel: Recent Labs  Lab 11/28/20 2319 11/30/20 0108  NA 134*  --   K 4.3  --   CL 101  --   CO2 25  --   GLUCOSE 162*  --   BUN 50*  --   CREATININE 0.95  --   CALCIUM 8.7*  --   MG  --  1.9   GFR: CrCl cannot be calculated (Unknown ideal weight.). Liver Function Tests: Recent Labs  Lab 11/28/20 2319  AST 14*  ALT 12  ALKPHOS 47  BILITOT 0.3  PROT 5.8*  ALBUMIN 3.5   No results for input(s): LIPASE, AMYLASE in the last 168 hours. No results for input(s): AMMONIA in the last 168 hours. Coagulation Profile: Recent Labs  Lab 11/30/20 0108  INR 1.2  Cardiac Enzymes: No results for input(s): CKTOTAL, CKMB, CKMBINDEX, TROPONINI in the last 168 hours. BNP (last 3 results) No results for input(s): PROBNP in the last 8760 hours. HbA1C: No results for input(s): HGBA1C in the last 72 hours. CBG: No results for input(s): GLUCAP in the last 168 hours. Lipid Profile: No results for input(s): CHOL, HDL, LDLCALC, TRIG, CHOLHDL, LDLDIRECT in the last 72 hours. Thyroid Function Tests: No results for input(s): TSH, T4TOTAL, FREET4, T3FREE, THYROIDAB in the last 72 hours. Anemia Panel: Recent Labs    11/29/20 1507 11/30/20 0108  FOLATE  --  22.0  FERRITIN  --  70  TIBC  --  325  IRON  --  281*  RETICCTPCT 2.1  --    Sepsis Labs: No results for input(s): PROCALCITON, LATICACIDVEN in the last 168 hours.  Recent Results (from the past 240 hour(s))  Resp Panel by RT-PCR (Flu A&B, Covid) Nasopharyngeal Swab     Status: None   Collection Time: 11/29/20  5:26 AM   Specimen: Nasopharyngeal Swab; Nasopharyngeal(NP) swabs in vial transport medium  Result Value Ref  Range Status   SARS Coronavirus 2 by RT PCR NEGATIVE NEGATIVE Final    Comment: (NOTE) SARS-CoV-2 target nucleic acids are NOT DETECTED.  The SARS-CoV-2 RNA is generally detectable in upper respiratory specimens during the acute phase of infection. The lowest concentration of SARS-CoV-2 viral copies this assay can detect is 138 copies/mL. A negative result does not preclude SARS-Cov-2 infection and should not be used as the sole basis for treatment or other patient management decisions. A negative result may occur with  improper specimen collection/handling, submission of specimen other than nasopharyngeal swab, presence of viral mutation(s) within the areas targeted by this assay, and inadequate number of viral copies(<138 copies/mL). A negative result must be combined with clinical observations, patient history, and epidemiological information. The expected result is Negative.  Fact Sheet for Patients:  BloggerCourse.com  Fact Sheet for Healthcare Providers:  SeriousBroker.it  This test is no t yet approved or cleared by the [place] FDA and  has been authorized for detection and/or diagnosis of SARS-CoV-2 by FDA under an Emergency Use Authorization (EUA). This EUA will remain  in effect (meaning this test can be used) for the duration of the COVID-19 declaration under Section 564(b)(1) of the Act, 21 U.S.C.section 360bbb-3(b)(1), unless the authorization is terminated  or revoked sooner.       Influenza A by PCR NEGATIVE NEGATIVE Final   Influenza B by PCR NEGATIVE NEGATIVE Final    Comment: (NOTE) The Xpert Xpress SARS-CoV-2/FLU/RSV plus assay is intended as an aid in the diagnosis of influenza from Nasopharyngeal swab specimens and should not be used as a sole basis for treatment. Nasal washings and aspirates are unacceptable for Xpert Xpress SARS-CoV-2/FLU/RSV testing.  Fact Sheet for  Patients: BloggerCourse.com  Fact Sheet for Healthcare Providers: SeriousBroker.it  This test is not yet approved or cleared by the [place] FDA and has been authorized for detection and/or diagnosis of SARS-CoV-2 by FDA under an Emergency Use Authorization (EUA). This EUA will remain in effect (meaning this test can be used) for the duration of the COVID-19 declaration under Section 564(b)(1) of the Act, 21 U.S.C. section 360bbb-3(b)(1), unless the authorization is terminated or revoked.  Performed at [facility], [address] [place], [place] [address]          Radiology Studies: No results found.      Scheduled Meds:  [MAR Hold] sodium  chloride   Intravenous Once   [MAR Hold] buPROPion  300 mg Oral Daily   [MAR Hold] pantoprazole  40 mg Intravenous Q12H   [MAR Hold] rosuvastatin  10 mg Oral Daily   [MAR Hold] sacubitril-valsartan  1 tablet Oral BID   [MAR Hold] sotalol  160 mg Oral Q24H   And   [MAR Hold] sotalol  80 mg Oral Q24H   [MAR Hold] spironolactone  25 mg Oral Daily   [MAR Hold] thyroid  120 mg Oral QAC breakfast   Continuous Infusions:  pantoprazole Stopped (11/29/20 1420)     LOS: 0 days    Time spent: 25 MIN  [name], MD 11/30/2020, 1:55 PM

## 2020-11-30 NOTE — Plan of Care (Signed)
  Problem: Education: Goal: Knowledge of General Education information will improve Description Including pain rating scale, medication(s)/side effects and non-pharmacologic comfort measures Outcome: Progressing   Problem: Health Behavior/Discharge Planning: Goal: Ability to manage health-related needs will improve Outcome: Progressing   

## 2020-11-30 NOTE — Anesthesia Preprocedure Evaluation (Signed)
Anesthesia Evaluation  Patient identified by MRN, date of birth, ID band Patient awake    Reviewed: Allergy & Precautions, NPO status , Patient's Chart, lab work & pertinent test results  History of Anesthesia Complications Negative for: history of anesthetic complications  Airway Mallampati: II  TM Distance: >3 FB Neck ROM: Full    Dental  (+) Teeth Intact   Pulmonary neg pulmonary ROS,    Pulmonary exam normal        Cardiovascular + Past MI and +CHF  Normal cardiovascular exam+ dysrhythmias Atrial Fibrillation + Cardiac Defibrillator      Neuro/Psych negative neurological ROS     GI/Hepatic Neg liver ROS, GERD  ,  Endo/Other  Hypothyroidism   Renal/GU CRFRenal disease  negative genitourinary   Musculoskeletal negative musculoskeletal ROS (+)   Abdominal   Peds  Hematology  (+) Blood dyscrasia, anemia ,   Anesthesia Other Findings   Reproductive/Obstetrics                             Anesthesia Physical Anesthesia Plan  ASA: 4  Anesthesia Plan: MAC   Post-op Pain Management:    Induction: Intravenous  PONV Risk Score and Plan: Propofol infusion, TIVA and Treatment may vary due to age or medical condition  Airway Management Planned: Natural Airway, Nasal Cannula and Simple Face Mask  Additional Equipment: None  Intra-op Plan:   Post-operative Plan:   Informed Consent: I have reviewed the patients History and Physical, chart, labs and discussed the procedure including the risks, benefits and alternatives for the proposed anesthesia with the patient or authorized representative who has indicated his/her understanding and acceptance.       Plan Discussed with:   Anesthesia Plan Comments:         Anesthesia Quick Evaluation

## 2020-11-30 NOTE — Anesthesia Postprocedure Evaluation (Signed)
Anesthesia Post Note  Patient: Luba Matzen  Procedure(s) Performed: ESOPHAGOGASTRODUODENOSCOPY (EGD) WITH PROPOFOL GIVENS CAPSULE STUDY     Patient location during evaluation: Endoscopy Anesthesia Type: MAC Level of consciousness: awake and alert Pain management: pain level controlled Vital Signs Assessment: post-procedure vital signs reviewed and stable Respiratory status: spontaneous breathing, nonlabored ventilation, respiratory function stable and patient connected to nasal cannula oxygen Cardiovascular status: stable and blood pressure returned to baseline Postop Assessment: no apparent nausea or vomiting Anesthetic complications: no   No notable events documented.  Last Vitals:  Vitals:   11/30/20 1410 11/30/20 1420  BP: (!) 83/64 (!) 88/53  Pulse: 71 84  Resp: 18 (!) 24  Temp:    SpO2: 97% (!) 83%    Last Pain:  Vitals:   11/30/20 1420  TempSrc:   PainSc: 0-No pain                 Belenda Cruise P Rochele Lueck

## 2020-11-30 NOTE — Interval H&P Note (Signed)
History and Physical Interval Note:  No acute events overnight.  Patient's hgb decreased slightly to 8 from 8.5.  Still having dark stools.  Hemodynamically stable (baseline low BP)  11/30/2020 1:22 PM  Catherine Mays  has presented today for surgery, with the diagnosis of anemia, melena.  The various methods of treatment have been discussed with the patient and family. After consideration of risks, benefits and other options for treatment, the patient has consented to  Procedure(s): ESOPHAGOGASTRODUODENOSCOPY (EGD) WITH PROPOFOL (N/A) as a surgical intervention.  The patient's history has been reviewed, patient examined, no change in status, stable for surgery.  I have reviewed the patient's chart and labs.  Questions were answered to the patient's satisfaction.     Jenel Lucks

## 2020-11-30 NOTE — Op Note (Signed)
[facility] Patient Name: Catherine Mays Procedure Date : 11/30/2020 MRN: [id number] Attending MD: Dub Amis. Tomasa Rand , MD Date of Birth: 01-14-1966 CSN: [id number] Age: 55 Admit Type: Inpatient Procedure:                Upper GI endoscopy Indications:              Acute post hemorrhagic anemia, Suspected upper                            gastrointestinal bleeding; history of occult GI                            bleed in 2021 with EGD/Colo/VCE without bleeding                            source Providers:                Lorin Picket E. Tomasa Rand, MD, Roselie Awkward, RN, Lawson Radar, Technician, Rosiland Oz, CRNA Referring MD:              Medicines:                Monitored Anesthesia Care Complications:            No immediate complications. Estimated Blood Loss:     Estimated blood loss: none. Procedure:                Pre-Anesthesia Assessment:                           - Prior to the procedure, a History and Physical                            was performed, and patient medications and                            allergies were reviewed. The patient's tolerance of                            previous anesthesia was also reviewed. The risks                            and benefits of the procedure and the sedation                            options and risks were discussed with the patient.                            All questions were answered, and informed consent                            was obtained. Prior Anticoagulants: The patient has  taken Eliquis (apixaban), last dose was 3 days                            prior to procedure. ASA Grade Assessment: IV - A                            patient with severe systemic disease that is a                            constant threat to life. After reviewing the risks                            and benefits, the patient was deemed in                            satisfactory  condition to undergo the procedure.                           After obtaining informed consent, the endoscope was                            passed under direct vision. Throughout the                            procedure, the patient's blood pressure, pulse, and                            oxygen saturations were monitored continuously. The                            [id number]-[id number] ([id number]) Olympus endoscope was introduced                            through the mouth, and advanced to the third part                            of duodenum. The upper GI endoscopy was                            accomplished without difficulty. The patient                            tolerated the procedure well. Scope In: Scope Out: Findings:      The examined portions of the nasopharynx, oropharynx and larynx were       normal.      The examined esophagus was normal.      A 3 cm hiatal hernia was present.      A few small sessile polyps with no bleeding and no stigmata of recent       bleeding were found in the gastric body.      The exam of the stomach was otherwise normal.      The examined duodenum was normal, but was friable, as there was contact       injury from the  scope passing through the second portion of the duodenum       as well as with water irrigation Impression:               - The examined portions of the nasopharynx,                            oropharynx and larynx were normal.                           - Normal esophagus.                           - 3 cm hiatal hernia.                           - A few gastric polyps.                           - Friable duodenal mucosa, otherwise normal                            examined duodenum.                           - Bleeding source remains unclear at this point.                            Potential etiologies include vascular lesions                            further distal in the small intestine vs a                            dieulafoy lesion.  Based on the elevated BUN on                            presentation, I would be more suspicious for a                            gastric lesion, but no bleeding sources seen.                           - No specimens collected. Recommendation:           - Return patient to hospital ward for ongoing care.                           - Ok to resume regular diet                           - Resume Eliquis (apixaban) at prior dose tomorrow.                           - Observe overnight for evidence of ongoing  bleeding.                           - Recommend repeat capsule endoscopy vs tagged RBC                            scan if hemoglobin drops again Procedure Code(s):        --- Professional ---                           [phone], Esophagogastroduodenoscopy, flexible,                            transoral; diagnostic, including collection of                            specimen(s) by brushing or washing, when performed                            (separate procedure) Diagnosis Code(s):        --- Professional ---                           K44.9, Diaphragmatic hernia without obstruction or                            gangrene                           K31.7, Polyp of stomach and duodenum                           D62, Acute posthemorrhagic anemia CPT copyright 2019 American Medical Association. All rights reserved. The codes documented in this report are preliminary and upon coder review may  be revised to meet current compliance requirements. Arnez Stoneking E. Tomasa Rand, MD 11/30/2020 1:56:18 PM This report has been signed electronically. Number of Addenda: 0

## 2020-11-30 NOTE — Transfer of Care (Signed)
Immediate Anesthesia Transfer of Care Note  Patient: Catherine Mays  Procedure(s) Performed: ESOPHAGOGASTRODUODENOSCOPY (EGD) WITH PROPOFOL  Patient Location: Endoscopy Unit  Anesthesia Type:MAC  Level of Consciousness: drowsy and patient cooperative  Airway & Oxygen Therapy: Patient Spontanous Breathing  Post-op Assessment: Report given to RN and Post -op Vital signs reviewed and stable  Post vital signs: Reviewed and stable  Last Vitals:  Vitals Value Taken Time  BP 80/58 11/30/20 1342  Temp    Pulse 77 11/30/20 1343  Resp 20 11/30/20 1343  SpO2 99 % 11/30/20 1343  Vitals shown include unvalidated device data.  Last Pain:  Vitals:   11/30/20 1209  TempSrc: Temporal  PainSc: 0-No pain         Complications: No notable events documented.

## 2020-11-30 NOTE — Progress Notes (Signed)
Endo nurse here to transport pt down to endo for EGD.

## 2020-12-01 DIAGNOSIS — Z7901 Long term (current) use of anticoagulants: Secondary | ICD-10-CM | POA: Diagnosis not present

## 2020-12-01 DIAGNOSIS — K219 Gastro-esophageal reflux disease without esophagitis: Secondary | ICD-10-CM | POA: Diagnosis present

## 2020-12-01 DIAGNOSIS — E1122 Type 2 diabetes mellitus with diabetic chronic kidney disease: Secondary | ICD-10-CM | POA: Diagnosis present

## 2020-12-01 DIAGNOSIS — F419 Anxiety disorder, unspecified: Secondary | ICD-10-CM | POA: Diagnosis not present

## 2020-12-01 DIAGNOSIS — D62 Acute posthemorrhagic anemia: Secondary | ICD-10-CM | POA: Diagnosis present

## 2020-12-01 DIAGNOSIS — Z20822 Contact with and (suspected) exposure to covid-19: Secondary | ICD-10-CM | POA: Diagnosis present

## 2020-12-01 DIAGNOSIS — K921 Melena: Secondary | ICD-10-CM | POA: Diagnosis present

## 2020-12-01 DIAGNOSIS — I340 Nonrheumatic mitral (valve) insufficiency: Secondary | ICD-10-CM | POA: Diagnosis present

## 2020-12-01 DIAGNOSIS — I5042 Chronic combined systolic (congestive) and diastolic (congestive) heart failure: Secondary | ICD-10-CM | POA: Diagnosis present

## 2020-12-01 DIAGNOSIS — I2510 Atherosclerotic heart disease of native coronary artery without angina pectoris: Secondary | ICD-10-CM | POA: Diagnosis present

## 2020-12-01 DIAGNOSIS — E785 Hyperlipidemia, unspecified: Secondary | ICD-10-CM | POA: Diagnosis present

## 2020-12-01 DIAGNOSIS — I471 Supraventricular tachycardia: Secondary | ICD-10-CM | POA: Diagnosis not present

## 2020-12-01 DIAGNOSIS — F32A Depression, unspecified: Secondary | ICD-10-CM | POA: Diagnosis present

## 2020-12-01 DIAGNOSIS — I252 Old myocardial infarction: Secondary | ICD-10-CM | POA: Diagnosis not present

## 2020-12-01 DIAGNOSIS — D696 Thrombocytopenia, unspecified: Secondary | ICD-10-CM | POA: Diagnosis not present

## 2020-12-01 DIAGNOSIS — N183 Chronic kidney disease, stage 3 unspecified: Secondary | ICD-10-CM | POA: Diagnosis present

## 2020-12-01 DIAGNOSIS — E890 Postprocedural hypothyroidism: Secondary | ICD-10-CM | POA: Diagnosis present

## 2020-12-01 DIAGNOSIS — K5730 Diverticulosis of large intestine without perforation or abscess without bleeding: Secondary | ICD-10-CM | POA: Diagnosis present

## 2020-12-01 DIAGNOSIS — I472 Ventricular tachycardia, unspecified: Secondary | ICD-10-CM | POA: Diagnosis not present

## 2020-12-01 DIAGNOSIS — I5022 Chronic systolic (congestive) heart failure: Secondary | ICD-10-CM | POA: Diagnosis not present

## 2020-12-01 DIAGNOSIS — I959 Hypotension, unspecified: Secondary | ICD-10-CM | POA: Diagnosis present

## 2020-12-01 DIAGNOSIS — I480 Paroxysmal atrial fibrillation: Secondary | ICD-10-CM | POA: Diagnosis present

## 2020-12-01 DIAGNOSIS — I130 Hypertensive heart and chronic kidney disease with heart failure and stage 1 through stage 4 chronic kidney disease, or unspecified chronic kidney disease: Secondary | ICD-10-CM | POA: Diagnosis present

## 2020-12-01 DIAGNOSIS — K922 Gastrointestinal hemorrhage, unspecified: Secondary | ICD-10-CM | POA: Diagnosis not present

## 2020-12-01 DIAGNOSIS — B379 Candidiasis, unspecified: Secondary | ICD-10-CM | POA: Diagnosis present

## 2020-12-01 DIAGNOSIS — I255 Ischemic cardiomyopathy: Secondary | ICD-10-CM | POA: Diagnosis present

## 2020-12-01 LAB — BASIC METABOLIC PANEL
Anion gap: 7 (ref 5–15)
BUN: 34 mg/dL — ABNORMAL HIGH (ref 6–20)
CO2: 24 mmol/L (ref 22–32)
Calcium: 8.4 mg/dL — ABNORMAL LOW (ref 8.9–10.3)
Chloride: 106 mmol/L (ref 98–111)
Creatinine, Ser: 0.93 mg/dL (ref 0.44–1.00)
GFR, Estimated: >60 mL/min (ref >60)
Glucose, Bld: 110 mg/dL — ABNORMAL HIGH (ref 70–99)
Potassium: 3.6 mmol/L (ref 3.5–5.1)
Sodium: 137 mmol/L (ref 135–145)

## 2020-12-01 LAB — CBC
HCT: 21.2 % — ABNORMAL LOW (ref 36.0–46.0)
Hemoglobin: 7 g/dL — ABNORMAL LOW (ref 12.0–15.0)
MCH: 30.6 pg (ref 26.0–34.0)
MCHC: 33 g/dL (ref 30.0–36.0)
MCV: 92.6 fL (ref 80.0–100.0)
Platelets: 155 10*3/uL (ref 150–400)
RBC: 2.29 MIL/uL — ABNORMAL LOW (ref 3.87–5.11)
RDW: 14.2 % (ref 11.5–15.5)
WBC: 8.5 10*3/uL (ref 4.0–10.5)
nRBC: 0.2 % (ref 0.0–0.2)

## 2020-12-01 LAB — HEMOGLOBIN AND HEMATOCRIT, BLOOD
HCT: 25.6 % — ABNORMAL LOW (ref 36.0–46.0)
Hemoglobin: 8.4 g/dL — ABNORMAL LOW (ref 12.0–15.0)

## 2020-12-01 LAB — PREPARE RBC (CROSSMATCH)

## 2020-12-01 MED ORDER — PEG 3350-KCL-NA BICARB-NACL 420 G PO SOLR
4000.0000 mL | Freq: Once | ORAL | Status: AC
  Administered 2020-12-01: 4000 mL via ORAL
  Filled 2020-12-01 (×3): qty 4000

## 2020-12-01 MED ORDER — TRAZODONE HCL 50 MG PO TABS: 25.0000 mg | ORAL_TABLET | Freq: Every evening | ORAL | Status: DC | PRN

## 2020-12-01 MED ORDER — SODIUM CHLORIDE 0.9% IV SOLUTION: Freq: Once | INTRAVENOUS | Status: AC

## 2020-12-01 MED ORDER — LORATADINE 10 MG PO TABS
10.0000 mg | ORAL_TABLET | Freq: Every day | ORAL | Status: DC
  Administered 2020-12-01 – 2020-12-04 (×4): 10 mg via ORAL
  Filled 2020-12-01 (×4): qty 1

## 2020-12-01 NOTE — H&P (View-Only) (Signed)
The VCE was positive for blood in the mid and distal small bowel as well as the proximal colon.  The exact site of bleeding was difficult to localize.  She will prep for a colonoscopy tomorrow to see if the bleeding is in the distal TI or the proximal colon.

## 2020-12-01 NOTE — Plan of Care (Signed)
  Problem: Education: Goal: Knowledge of General Education information will improve Description Including pain rating scale, medication(s)/side effects and non-pharmacologic comfort measures Outcome: Progressing   Problem: Health Behavior/Discharge Planning: Goal: Ability to manage health-related needs will improve Outcome: Progressing   

## 2020-12-01 NOTE — Progress Notes (Signed)
The VCE was positive for blood in the mid and distal small bowel as well as the proximal colon.  The exact site of bleeding was difficult to localize.  She will prep for a colonoscopy tomorrow to see if the bleeding is in the distal TI or the proximal colon. 

## 2020-12-01 NOTE — Progress Notes (Signed)
PROGRESS NOTE    Catherine Mays  TKZ:[id number] DOB: 03-03-1965 DOA: 11/28/2020 PCP: Catherine Stabile, MD   Brief Narrative: HPI per Dr. Glade Nurse Catherine Mays is a 55 y.o. 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    The patient reports 1 day of dizziness in the absence of any syncope or vertigo.  She notes that this is been associated by 3 days of dark-colored stool, which is new for her.  Catherine Mays reports 1 episode of nausea resulting in an episode of nonbloody, nonbilious emesis over the last time.  She also reports no associated coffee-ground appearance with this episode of emesis.  Denies any associated hematochezia.  Denies any recent abdominal pain, loose stool, or any preceding trauma.  She notes that her dizziness has not been associate with any shortness of breath, chest pain, palpitations, diaphoresis.  No recent trauma or any recent subjective fever, chills, rigors, or generalized myalgias.  Denies any recent rash, dysuria, gross materia, or change in urinary urgency/frequency.  Denies any recent cough or hemoptysis.  No recent worsening of edema in the bilateral lower extremities.   The patient reports that she had a suspected gastrointestinal bleed 2021, for which she underwent Catherine Mays in April 2021 as well as colonoscopy in June 2021.  Denies any known issues with gastrointestinal bleeding in the interval leading up to the last few days.  Denies any regular or recent consumption of alcohol, denies any known history of underlying chronic liver disease.  In the setting of paroxysmal atrial fibrillation, she is chronically  anticoagulated on Catherine Mays, with most recent such dose occurring on 11/28/2020.  Otherwise, she denies any use of blood thinners as an outpatient, including no aspirin. Also denies taking any of the following oral medications at home: systemic corticosteroids, supplemental potassium, supplemental iron, or bisphosphonate medications.  The setting of prior gastrointestinal bleed, it appears that the patient is on daily Catherine Mays.    Per chart review, it appears that patient's baseline hemoglobin is 10-12, with most recent prior hemoglobin noted to be 11.8 on 07/20/2020.    She has a history of chronic systolic/diastolic heart failure, with most recent echocardiogram in January 2022 demonstrating LVEF 25 to 30%, mildly dilated left ventricular internal cavity, grade 2 diastolic dysfunction, and no evidence of significant valvular pathology.  Associated medical management includes Catherine Mays and Catherine Mays.  In the context of her history of paroxysmal atrial fibrillation she is also on Catherine Mays.   Assessment & Plan:   Principal Problem:   Acute upper Catherine Mays bleed Active Problems:   Hypothyroidism   PAF (paroxysmal atrial fibrillation) (HCC)   Chronic systolic CHF (congestive heart failure) (HCC)   Acute on chronic blood loss anemia   Catherine Mays bleed  #1 upper Catherine Mays bleed patient admitted with complaints of melena for 3 days prior to admission to the hospital in the setting of Catherine Mays for A. fib.  She denied use of NSAIDs or alcohol. Seen by Catherine Mays Catherine Mays today-shows no obvious source of bleeding other than duodenal mucosa friable.  Catherine Mays recommends a tagged RBC scan or capsule endoscopy if further bleeding occurs continue to hold Catherine Mays today. She continues to have melena and dropped her hemoglobin to 7 from 8  yesterday.  We will transfuse another unit of packed RBC today. Patient is having capsule endoscopy done today. She is status post 1 unit blood transfusion and Feraheme Continue Protonix drip and follow Catherine Mays  recommendations. ?  On Catherine Mays for SBP prophylaxis?  Will DC if Catherine Mays does not think she needs it. On admit her hemoglobin was 8.6 which is down from 11.8 in May 2022.  #2 paroxysmal atrial fibrillation Catherine Mays restarted. Catherine Mays on hold.  #3 hypothyroidism continue Catherine Mays  #4 anxiety/depression on Catherine Mays continue  Estimated body mass index is 21.49 kg/m as calculated from the following:   Height as of this encounter: 5\' 9"  (1.753 m).   Weight as of this encounter: 66 kg.  DVT prophylaxis: SCD Code Status: full Family Communication: dw husband Disposition Plan:  Status is: Inpatient  The patient remains OBS appropriate and will d/c before 2 midnights.  Dispo: The patient is from: Home              Anticipated d/c is to: Home              Patient currently is not medically stable to d/c.   Difficult to place patient Yes   Consultants: Catherine Mays  Procedures: Catherine Mays 10/7 Antimicrobials: none  Subjective: She complains of feeling tired and weak denies shortness of breath or chest pain Objective: Vitals:   12/01/20 1030 12/01/20 1037 12/01/20 1045 12/01/20 1103  BP:  (!) 72/44 (!) 72/44 (!) 82/50  Pulse:   84 69  Resp: 16 19 (!) 25 20  Temp:  98.5 F (36.9 C)  98.9 F (37.2 C)  TempSrc:  Oral  Oral  SpO2:  97% 97% 100%  Weight:      Height:        Intake/Output Summary (Last 24 hours) at 12/01/2020 1231 Last data filed at 11/30/2020 1521 Gross per 24 hour  Intake 130.67 ml  Output --  Net 130.67 ml    Filed Weights   11/30/20 1556  Weight: 66 kg    Examination:  General exam: Appears calm and comfortable  Respiratory system: Clear to auscultation. Respiratory effort normal. Cardiovascular system: S1 & S2 heard, RRR. No JVD, murmurs, rubs, gallops or clicks. No pedal edema. Gastrointestinal system: Abdomen is nondistended, soft and nontender. No organomegaly or masses felt. Normal bowel sounds heard. Central nervous system: Alert and oriented. No focal  neurological deficits. Extremities: Symmetric 5 x 5 power. Skin: No rashes, lesions or ulcers Psychiatry: Judgement and insight appear normal. Mood & affect appropriate.     Data Reviewed: I have personally reviewed following labs and imaging studies  CBC: Recent Labs  Lab 11/28/20 2319 11/29/20 0525 11/30/20 0100 12/01/20 0029  WBC 7.2  --   --  8.5  HGB 8.6* 8.5* 8.0* 7.0*  HCT 26.8* 25.7* 24.1* 21.2*  MCV 93.4  --   --  92.6  PLT 164  --   --  155    Basic Metabolic Panel: Recent Labs  Lab 11/28/20 2319 11/30/20 0108 12/01/20 0029  NA 134*  --  137  K 4.3  --  3.6  CL 101  --  106  CO2 25  --  24  GLUCOSE 162*  --  110*  BUN 50*  --  34*  CREATININE 0.95  --  0.93  CALCIUM 8.7*  --  8.4*  MG  --  1.9  --     GFR: Estimated Creatinine Clearance: 72.1 mL/min (by C-G formula based on SCr  of 0.93 mg/dL). Liver Function Tests: Recent Labs  Lab 11/28/20 2319  AST 14*  ALT 12  ALKPHOS 47  BILITOT 0.3  PROT 5.8*  ALBUMIN 3.5    No results for input(s): LIPASE, AMYLASE in the last 168 hours. No results for input(s): AMMONIA in the last 168 hours. Coagulation Profile: Recent Labs  Lab 11/30/20 0108  INR 1.2    Cardiac Enzymes: No results for input(s): CKTOTAL, CKMB, CKMBINDEX, TROPONINI in the last 168 hours. BNP (last 3 results) No results for input(s): PROBNP in the last 8760 hours. HbA1C: No results for input(s): HGBA1C in the last 72 hours. CBG: No results for input(s): GLUCAP in the last 168 hours. Lipid Profile: No results for input(s): CHOL, HDL, LDLCALC, TRIG, CHOLHDL, LDLDIRECT in the last 72 hours. Mays Function Tests: No results for input(s): TSH, T4TOTAL, FREET4, T3FREE, THYROIDAB in the last 72 hours. Anemia Panel: Recent Labs    11/29/20 1507 11/30/20 0108  FOLATE  --  22.0  FERRITIN  --  70  TIBC  --  325  IRON  --  281*  RETICCTPCT 2.1  --     Sepsis Labs: No results for input(s): PROCALCITON, LATICACIDVEN in the last  168 hours.  Recent Results (from the past 240 hour(s))  Resp Panel by RT-PCR (Flu A&B, Covid) Nasopharyngeal Swab     Status: None   Collection Time: 11/29/20  5:26 AM   Specimen: Nasopharyngeal Swab; Nasopharyngeal(NP) swabs in vial transport medium  Result Value Ref Range Status   SARS Coronavirus 2 by RT PCR NEGATIVE NEGATIVE Final    Comment: (NOTE) SARS-CoV-2 target nucleic acids are NOT DETECTED.  The SARS-CoV-2 RNA is generally detectable in upper respiratory specimens during the acute phase of infection. The lowest concentration of SARS-CoV-2 viral copies this assay can detect is 138 copies/mL. A negative result does not preclude SARS-Cov-2 infection and should not be used as the sole basis for treatment or other patient management decisions. A negative result may occur with  improper specimen collection/handling, submission of specimen other than nasopharyngeal swab, presence of viral mutation(s) within the areas targeted by this assay, and inadequate number of viral copies(<138 copies/mL). A negative result must be combined with clinical observations, patient history, and epidemiological information. The expected result is Negative.  Fact Sheet for Patients:  BloggerCourse.com  Fact Sheet for Healthcare Providers:  SeriousBroker.it  This test is no t yet approved or cleared by the [place] FDA and  has been authorized for detection and/or diagnosis of SARS-CoV-2 by FDA under an Emergency Use Authorization (EUA). This EUA will remain  in effect (meaning this test can be used) for the duration of the COVID-19 declaration under Section 564(b)(1) of the Act, 21 U.S.C.section 360bbb-3(b)(1), unless the authorization is terminated  or revoked sooner.       Influenza A by PCR NEGATIVE NEGATIVE Final   Influenza B by PCR NEGATIVE NEGATIVE Final    Comment: (NOTE) The Xpert Xpress SARS-CoV-2/FLU/RSV plus assay is  intended as an aid in the diagnosis of influenza from Nasopharyngeal swab specimens and should not be used as a sole basis for treatment. Nasal washings and aspirates are unacceptable for Xpert Xpress SARS-CoV-2/FLU/RSV testing.  Fact Sheet for Patients: BloggerCourse.com  Fact Sheet for Healthcare Providers: SeriousBroker.it  This test is not yet approved or cleared by the [place] FDA and has been authorized for detection and/or diagnosis of SARS-CoV-2 by FDA under an Emergency Use Authorization (EUA). This EUA will remain in effect (  meaning this test can be used) for the duration of the COVID-19 declaration under Section 564(b)(1) of the Act, 21 U.S.C. section 360bbb-3(b)(1), unless the authorization is terminated or revoked.  Performed at [facility], [address] [place], [place] [address]           Radiology Studies: No results found.      Scheduled Meds:  sodium chloride   Intravenous Once   sodium chloride   Intravenous Once   buPROPion  300 mg Oral Daily   loratadine  10 mg Oral Daily   [START ON 12/02/2020] pantoprazole  40 mg Intravenous Q12H   rosuvastatin  10 mg Oral Daily   sacubitril-valsartan  1 tablet Oral BID   Catherine Mays  160 mg Oral Q24H   And   Catherine Mays  80 mg Oral Q24H   Catherine Mays  25 mg Oral Daily   Mays  120 mg Oral QAC breakfast   Continuous Infusions:  pantoprazole Stopped (11/29/20 1420)     LOS: 0 days    Time spent: 41 MIN  [name], MD 12/01/2020, 12:31 PM

## 2020-12-02 ENCOUNTER — Inpatient Hospital Stay (HOSPITAL_COMMUNITY): Payer: BC Managed Care – PPO | Admitting: Certified Registered Nurse Anesthetist

## 2020-12-02 ENCOUNTER — Encounter (HOSPITAL_COMMUNITY): Payer: Self-pay | Admitting: Internal Medicine

## 2020-12-02 ENCOUNTER — Encounter (HOSPITAL_COMMUNITY)
Admission: EM | Disposition: A | Discharge status: Home / Self Care | Payer: Self-pay | Source: Home / Self Care | Attending: Internal Medicine

## 2020-12-02 HISTORY — PX: COLONOSCOPY WITH PROPOFOL: SHX5780

## 2020-12-02 LAB — CBC
HCT: 22.7 % — ABNORMAL LOW (ref 36.0–46.0)
Hemoglobin: 7.7 g/dL — ABNORMAL LOW (ref 12.0–15.0)
MCH: 30.2 pg (ref 26.0–34.0)
MCHC: 33.9 g/dL (ref 30.0–36.0)
MCV: 89 fL (ref 80.0–100.0)
Platelets: 129 10*3/uL — ABNORMAL LOW (ref 150–400)
RBC: 2.55 MIL/uL — ABNORMAL LOW (ref 3.87–5.11)
RDW: 15.4 % (ref 11.5–15.5)
WBC: 7.7 10*3/uL (ref 4.0–10.5)
nRBC: 0.5 % — ABNORMAL HIGH (ref 0.0–0.2)

## 2020-12-02 LAB — HEMOGLOBIN AND HEMATOCRIT, BLOOD
HCT: 23.3 % — ABNORMAL LOW (ref 36.0–46.0)
Hemoglobin: 7.5 g/dL — ABNORMAL LOW (ref 12.0–15.0)

## 2020-12-02 SURGERY — COLONOSCOPY WITH PROPOFOL
Anesthesia: General

## 2020-12-02 MED ORDER — ONDANSETRON HCL 4 MG/2ML IJ SOLN
INTRAMUSCULAR | Status: DC | PRN
  Administered 2020-12-02: 4 mg via INTRAVENOUS

## 2020-12-02 MED ORDER — LACTATED RINGERS IV SOLN: INTRAVENOUS | Status: DC

## 2020-12-02 MED ORDER — SUCCINYLCHOLINE CHLORIDE 200 MG/10ML IV SOSY
PREFILLED_SYRINGE | INTRAVENOUS | Status: DC | PRN
  Administered 2020-12-02: 120 mg via INTRAVENOUS

## 2020-12-02 MED ORDER — LIDOCAINE 2% (20 MG/ML) 5 ML SYRINGE
INTRAMUSCULAR | Status: DC | PRN
  Administered 2020-12-02: 40 mg via INTRAVENOUS

## 2020-12-02 MED ORDER — PROPOFOL 500 MG/50ML IV EMUL
INTRAVENOUS | Status: DC | PRN
  Administered 2020-12-02: 125 ug/kg/min via INTRAVENOUS

## 2020-12-02 MED ORDER — PROPOFOL 10 MG/ML IV BOLUS
INTRAVENOUS | Status: DC | PRN
  Administered 2020-12-02: 5 mg via INTRAVENOUS
  Administered 2020-12-02: 10 mg via INTRAVENOUS
  Administered 2020-12-02 (×3): 5 mg via INTRAVENOUS
  Administered 2020-12-02: 70 mg via INTRAVENOUS

## 2020-12-02 MED ORDER — PHENYLEPHRINE 40 MCG/ML (10ML) SYRINGE FOR IV PUSH (FOR BLOOD PRESSURE SUPPORT)
PREFILLED_SYRINGE | INTRAVENOUS | Status: DC | PRN
  Administered 2020-12-02 (×3): 40 ug via INTRAVENOUS
  Administered 2020-12-02: 120 ug via INTRAVENOUS
  Administered 2020-12-02: 40 ug via INTRAVENOUS

## 2020-12-02 SURGICAL SUPPLY — 22 items

## 2020-12-02 NOTE — Anesthesia Procedure Notes (Addendum)
Procedure Name: MAC Date/Time: 12/02/2020 10:05 AM Performed by: Janene Harvey, CRNA Pre-anesthesia Checklist: Patient identified, Emergency Drugs available, Suction available and Patient being monitored Patient Re-evaluated:Patient Re-evaluated prior to induction Oxygen Delivery Method: Nasal cannula Induction Type: IV induction Placement Confirmation: positive ETCO2 Dental Injury: Teeth and Oropharynx as per pre-operative assessment

## 2020-12-02 NOTE — Interval H&P Note (Signed)
History and Physical Interval Note:  12/02/2020 10:01 AM  Catherine Mays  has presented today for surgery, with the diagnosis of GI bleed.  The various methods of treatment have been discussed with the patient and family. After consideration of risks, benefits and other options for treatment, the patient has consented to  Procedure(s): COLONOSCOPY WITH PROPOFOL (N/A) as a surgical intervention.  The patient's history has been reviewed, patient examined, no change in status, stable for surgery.  I have reviewed the patient's chart and labs.  Questions were answered to the patient's satisfaction.     Elisa Sorlie D

## 2020-12-02 NOTE — Anesthesia Postprocedure Evaluation (Addendum)
Anesthesia Post Note  Patient: Catherine Mays  Procedure(s) Performed: COLONOSCOPY WITH PROPOFOL     Patient location during evaluation: PACU Anesthesia Type: General Level of consciousness: awake and alert Pain management: pain level controlled Vital Signs Assessment: post-procedure vital signs reviewed and stable Respiratory status: spontaneous breathing, nonlabored ventilation, respiratory function stable and patient connected to nasal cannula oxygen Cardiovascular status: stable and blood pressure returned to baseline Postop Assessment: no apparent nausea or vomiting Anesthetic complications: no   No notable events documented.  Last Vitals:  Vitals:   12/02/20 1122 12/02/20 1137  BP: (!) 83/53 (!) 88/56  Pulse: 66 65  Resp: 14 13  Temp: (!) 36.3 C 36.5 C  SpO2: 100% 100%    Last Pain:  Vitals:   12/02/20 1137  TempSrc:   PainSc: 0-No pain                 Shelton Silvas

## 2020-12-02 NOTE — Addendum Note (Signed)
Addendum  created 12/02/20 1427 by Shelton Silvas, MD   Clinical Note Signed, SmartForm saved

## 2020-12-02 NOTE — Progress Notes (Signed)
PROGRESS NOTE    Catherine Mays  PXT:[id number] DOB: 1965-11-04 DOA: 11/28/2020 PCP: Benita Stabile, MD   Brief Narrative: HPI per Dr. Glade Nurse [place] is a 55 y.o. female with medical history significant for chronic combined systolic/diastolic heart failure with LV EF 25 to 30% status post AICD, paroxysmal atrial fibrillation chronically anticoagulated on Eliquis, chronic anemia with baseline hemoglobin 10-12, who is admitted to [facility] on 11/28/2020 with suspected acute upper gastrointestinal bleed after presenting from home to [facility] ED complaining of dizziness.    The patient reports 1 day of dizziness in the absence of any syncope or vertigo.  She notes that this is been associated by 3 days of dark-colored stool, which is new for her.  Catherine Mays reports 1 episode of nausea resulting in an episode of nonbloody, nonbilious emesis over the last time.  She also reports no associated coffee-ground appearance with this episode of emesis.  Denies any associated hematochezia.  Denies any recent abdominal pain, loose stool, or any preceding trauma.  She notes that her dizziness has not been associate with any shortness of breath, chest pain, palpitations, diaphoresis.  No recent trauma or any recent subjective fever, chills, rigors, or generalized myalgias.  Denies any recent rash, dysuria, gross materia, or change in urinary urgency/frequency.  Denies any recent cough or hemoptysis.  No recent worsening of edema in the bilateral lower extremities.   The patient reports that she had a suspected gastrointestinal bleed 2021, for which she underwent EGD in April 2021 as well as colonoscopy in June 2021.  Denies any known issues with gastrointestinal bleeding in the interval leading up to the last few days.  Denies any regular or recent consumption of alcohol, denies any known history of underlying chronic liver disease.  In the setting of paroxysmal atrial fibrillation, she is chronically  anticoagulated on Eliquis, with most recent such dose occurring on 11/28/2020.  Otherwise, she denies any use of blood thinners as an outpatient, including no aspirin. Also denies taking any of the following oral medications at home: systemic corticosteroids, supplemental potassium, supplemental iron, or bisphosphonate medications.  The setting of prior gastrointestinal bleed, it appears that the patient is on daily Dexilant.    Per chart review, it appears that patient's baseline hemoglobin is 10-12, with most recent prior hemoglobin noted to be 11.8 on 07/20/2020.    She has a history of chronic systolic/diastolic heart failure, with most recent echocardiogram in January 2022 demonstrating LVEF 25 to 30%, mildly dilated left ventricular internal cavity, grade 2 diastolic dysfunction, and no evidence of significant valvular pathology.  Associated medical management includes Entresto and spironolactone.  In the context of her history of paroxysmal atrial fibrillation she is also on sotalol.   Assessment & Plan:   Principal Problem:   Acute upper GI bleed Active Problems:   Hypothyroidism   PAF (paroxysmal atrial fibrillation) (HCC)   Chronic systolic CHF (congestive heart failure) (HCC)   Acute on chronic blood loss anemia   GI bleed  #1 upper GI bleed patient admitted with complaints of melena for 3 days prior to admission to the hospital in the setting of Eliquis for A. fib.  She denied use of NSAIDs or alcohol. Seen by GI EGD -shows no obvious source of bleeding other than duodenal mucosa friable.  Colonoscopy 10/9-blood in the ileum 30 cm from the ileocecal valve.  Entire examined colon is normal. Capsule endoscopy 12/01/2020-positive for blood in the mid and distal small bowel as well  as the proximal colon.  Exact site of bleeding was difficult to localize. She is status post 2 units of blood transfusion and feraheme  during this hospital stay. On admit her hemoglobin was 8.6 which is down  from 11.8 in May 2022. H and h at 5 pm today   #2 paroxysmal atrial fibrillation- continue  sotalol  Eliquis on hold.  #3 hypothyroidism continue [place] Thyroid  #4 anxiety/depression on Wellbutrin continue  Estimated body mass index is 23.25 kg/m as calculated from the following:   Height as of this encounter: 5\' 9"  (1.753 m).   Weight as of this encounter: 71.4 kg.  DVT prophylaxis: SCD Code Status: full Family Communication: dw husband Disposition Plan:  Status is: Inpatient  The patient remains OBS appropriate and will d/c before 2 midnights.  Dispo: The patient is from: Home              Anticipated d/c is to: Home              Patient currently is not medically stable to d/c.   Difficult to place patient Yes   Consultants: gi  Procedures: egd 10/7 Antimicrobials: none  Subjective:  She denies any new c/o no further bleeding noted No melena She feels stronger Objective: Vitals:   12/02/20 0944 12/02/20 0954 12/02/20 1122 12/02/20 1137  BP: 94/61 (!) 88/53 (!) 83/53 (!) 88/56  Pulse:   66 65  Resp:   14 13  Temp:   (!) 97.4 F (36.3 C) 97.7 F (36.5 C)  TempSrc:      SpO2:   100% 100%  Weight:      Height:        Intake/Output Summary (Last 24 hours) at 12/02/2020 1322 Last data filed at 12/02/2020 1145 Gross per 24 hour  Intake 884.5 ml  Output 100 ml  Net 784.5 ml    Filed Weights   11/30/20 1556 12/02/20 0403 12/02/20 0939  Weight: 66 kg 71.4 kg 71.4 kg    Examination:  General exam: Appears calm and comfortable  Respiratory system: Clear to auscultation. Respiratory effort normal. Cardiovascular system: S1 & S2 heard, RRR. No JVD, murmurs, rubs, gallops or clicks. No pedal edema. Gastrointestinal system: Abdomen is nondistended, soft and nontender. No organomegaly or masses felt. Normal bowel sounds heard. Central nervous system: Alert and oriented. No focal neurological deficits. Extremities: Symmetric 5 x 5 power. Skin: No rashes,  lesions or ulcers Psychiatry: Judgement and insight appear normal. Mood & affect appropriate.     Data Reviewed: I have personally reviewed following labs and imaging studies  CBC: Recent Labs  Lab 11/28/20 2319 11/29/20 0525 11/30/20 0100 12/01/20 0029 12/01/20 1839 12/02/20 0119  WBC 7.2  --   --  8.5  --  7.7  HGB 8.6* 8.5* 8.0* 7.0* 8.4* 7.7*  HCT 26.8* 25.7* 24.1* 21.2* 25.6* 22.7*  MCV 93.4  --   --  92.6  --  89.0  PLT 164  --   --  155  --  129*    Basic Metabolic Panel: Recent Labs  Lab 11/28/20 2319 11/30/20 0108 12/01/20 0029  NA 134*  --  137  K 4.3  --  3.6  CL 101  --  106  CO2 25  --  24  GLUCOSE 162*  --  110*  BUN 50*  --  34*  CREATININE 0.95  --  0.93  CALCIUM 8.7*  --  8.4*  MG  --  1.9  --  GFR: Estimated Creatinine Clearance: 72.3 mL/min (by C-G formula based on SCr of 0.93 mg/dL). Liver Function Tests: Recent Labs  Lab 11/28/20 2319  AST 14*  ALT 12  ALKPHOS 47  BILITOT 0.3  PROT 5.8*  ALBUMIN 3.5    No results for input(s): LIPASE, AMYLASE in the last 168 hours. No results for input(s): AMMONIA in the last 168 hours. Coagulation Profile: Recent Labs  Lab 11/30/20 0108  INR 1.2    Cardiac Enzymes: No results for input(s): CKTOTAL, CKMB, CKMBINDEX, TROPONINI in the last 168 hours. BNP (last 3 results) No results for input(s): PROBNP in the last 8760 hours. HbA1C: No results for input(s): HGBA1C in the last 72 hours. CBG: No results for input(s): GLUCAP in the last 168 hours. Lipid Profile: No results for input(s): CHOL, HDL, LDLCALC, TRIG, CHOLHDL, LDLDIRECT in the last 72 hours. Thyroid Function Tests: No results for input(s): TSH, T4TOTAL, FREET4, T3FREE, THYROIDAB in the last 72 hours. Anemia Panel: Recent Labs    11/29/20 1507 11/30/20 0108  FOLATE  --  22.0  FERRITIN  --  70  TIBC  --  325  IRON  --  281*  RETICCTPCT 2.1  --     Sepsis Labs: No results for input(s): PROCALCITON, LATICACIDVEN in the  last 168 hours.  Recent Results (from the past 240 hour(s))  Resp Panel by RT-PCR (Flu A&B, Covid) Nasopharyngeal Swab     Status: None   Collection Time: 11/29/20  5:26 AM   Specimen: Nasopharyngeal Swab; Nasopharyngeal(NP) swabs in vial transport medium  Result Value Ref Range Status   SARS Coronavirus 2 by RT PCR NEGATIVE NEGATIVE Final    Comment: (NOTE) SARS-CoV-2 target nucleic acids are NOT DETECTED.  The SARS-CoV-2 RNA is generally detectable in upper respiratory specimens during the acute phase of infection. The lowest concentration of SARS-CoV-2 viral copies this assay can detect is 138 copies/mL. A negative result does not preclude SARS-Cov-2 infection and should not be used as the sole basis for treatment or other patient management decisions. A negative result may occur with  improper specimen collection/handling, submission of specimen other than nasopharyngeal swab, presence of viral mutation(s) within the areas targeted by this assay, and inadequate number of viral copies(<138 copies/mL). A negative result must be combined with clinical observations, patient history, and epidemiological information. The expected result is Negative.  Fact Sheet for Patients:  BloggerCourse.com  Fact Sheet for Healthcare Providers:  SeriousBroker.it  This test is no t yet approved or cleared by the [place] FDA and  has been authorized for detection and/or diagnosis of SARS-CoV-2 by FDA under an Emergency Use Authorization (EUA). This EUA will remain  in effect (meaning this test can be used) for the duration of the COVID-19 declaration under Section 564(b)(1) of the Act, 21 U.S.C.section 360bbb-3(b)(1), unless the authorization is terminated  or revoked sooner.       Influenza A by PCR NEGATIVE NEGATIVE Final   Influenza B by PCR NEGATIVE NEGATIVE Final    Comment: (NOTE) The Xpert Xpress SARS-CoV-2/FLU/RSV plus assay is  intended as an aid in the diagnosis of influenza from Nasopharyngeal swab specimens and should not be used as a sole basis for treatment. Nasal washings and aspirates are unacceptable for Xpert Xpress SARS-CoV-2/FLU/RSV testing.  Fact Sheet for Patients: BloggerCourse.com  Fact Sheet for Healthcare Providers: SeriousBroker.it  This test is not yet approved or cleared by the [place] FDA and has been authorized for detection and/or diagnosis of SARS-CoV-2 by FDA  under an Emergency Use Authorization (EUA). This EUA will remain in effect (meaning this test can be used) for the duration of the COVID-19 declaration under Section 564(b)(1) of the Act, 21 U.S.C. section 360bbb-3(b)(1), unless the authorization is terminated or revoked.  Performed at [facility], [address] [place], [place] [address]           Radiology Studies: No results found.      Scheduled Meds:  sodium chloride   Intravenous Once   buPROPion  300 mg Oral Daily   loratadine  10 mg Oral Daily   pantoprazole  40 mg Intravenous Q12H   rosuvastatin  10 mg Oral Daily   sacubitril-valsartan  1 tablet Oral BID   sotalol  160 mg Oral Q24H   And   sotalol  80 mg Oral Q24H   spironolactone  25 mg Oral Daily   thyroid  120 mg Oral QAC breakfast   Continuous Infusions:  lactated ringers 10 mL/hr at 12/02/20 0943     LOS: 1 day    Time spent: 41 MIN  [name], MD 12/02/2020, 1:22 PM

## 2020-12-02 NOTE — Transfer of Care (Signed)
Immediate Anesthesia Transfer of Care Note  Patient: Catherine Mays  Procedure(s) Performed: COLONOSCOPY WITH PROPOFOL  Patient Location: PACU  Anesthesia Type:General  Level of Consciousness: drowsy and patient cooperative  Airway & Oxygen Therapy: Patient Spontanous Breathing and Patient connected to face mask oxygen  Post-op Assessment: Report given to RN and Post -op Vital signs reviewed and stable  Post vital signs: Reviewed and stable  Last Vitals:  Vitals Value Taken Time  BP 87/48 12/02/20 1128  Temp 36.3 C 12/02/20 1122  Pulse 69 12/02/20 1131  Resp 20 12/02/20 1131  SpO2 100 % 12/02/20 1131  Vitals shown include unvalidated device data.  Last Pain:  Vitals:   12/02/20 1122  TempSrc:   PainSc: 0-No pain         Complications: No notable events documented.

## 2020-12-02 NOTE — Plan of Care (Signed)
  Problem: Education: Goal: Knowledge of General Education information will improve Description Including pain rating scale, medication(s)/side effects and non-pharmacologic comfort measures Outcome: Progressing   Problem: Health Behavior/Discharge Planning: Goal: Ability to manage health-related needs will improve Outcome: Progressing   

## 2020-12-02 NOTE — Anesthesia Procedure Notes (Signed)
Procedure Name: Intubation Date/Time: 12/02/2020 10:34 AM Performed by: Janene Harvey, CRNA Pre-anesthesia Checklist: Patient identified, Emergency Drugs available, Suction available and Patient being monitored Patient Re-evaluated:Patient Re-evaluated prior to induction Oxygen Delivery Method: Circle system utilized Preoxygenation: Pre-oxygenation with 100% oxygen Induction Type: IV induction, Rapid sequence and Cricoid Pressure applied Laryngoscope Size: Mac and 4 Grade View: Grade I Tube type: Oral Tube size: 7.0 mm Number of attempts: 1 Airway Equipment and Method: Stylet and Oral airway Placement Confirmation: ETT inserted through vocal cords under direct vision, positive ETCO2 and breath sounds checked- equal and bilateral Secured at: 22 cm Tube secured with: Tape Dental Injury: Teeth and Oropharynx as per pre-operative assessment

## 2020-12-02 NOTE — Anesthesia Preprocedure Evaluation (Addendum)
Anesthesia Evaluation  Patient identified by MRN, date of birth, ID band Patient awake    Reviewed: Allergy & Precautions, NPO status , Patient's Chart, lab work & pertinent test results  Airway Mallampati: II  TM Distance: >3 FB Neck ROM: Full    Dental  (+) Teeth Intact, Dental Advisory Given   Pulmonary neg pulmonary ROS,    breath sounds clear to auscultation       Cardiovascular hypertension, Pt. on medications + CAD, + Past MI, + Cardiac Stents and +CHF  + dysrhythmias Atrial Fibrillation + pacemaker + Cardiac Defibrillator  Rhythm:Regular Rate:Normal     Neuro/Psych negative neurological ROS     GI/Hepatic Neg liver ROS, GERD  ,  Endo/Other  Hypothyroidism   Renal/GU CRFRenal disease     Musculoskeletal negative musculoskeletal ROS (+)   Abdominal Normal abdominal exam  (+)   Peds  Hematology   Anesthesia Other Findings   Reproductive/Obstetrics                            Anesthesia Physical Anesthesia Plan  ASA: 3  Anesthesia Plan: MAC   Post-op Pain Management:    Induction: Intravenous  PONV Risk Score and Plan: 0 and Propofol infusion  Airway Management Planned: Natural Airway and Simple Face Mask  Additional Equipment: None  Intra-op Plan:   Post-operative Plan:   Informed Consent: I have reviewed the patients History and Physical, chart, labs and discussed the procedure including the risks, benefits and alternatives for the proposed anesthesia with the patient or authorized representative who has indicated his/her understanding and acceptance.       Plan Discussed with:   Anesthesia Plan Comments:        Anesthesia Quick Evaluation

## 2020-12-02 NOTE — Op Note (Signed)
[facility] Patient Name: Catherine Mays Procedure Date : 12/02/2020 MRN: [id number] Attending MD: Jeani Hawking , MD Date of Birth: 1965-04-03 CSN: [id number] Age: 55 Admit Type: Inpatient Procedure:                Colonoscopy Indications:              Melena Providers:                Jeani Hawking, MD, Brooke Person, Brion Aliment,                            Technician Referring MD:              Medicines:                General Anesthesia Complications:            No immediate complications. Estimated Blood Loss:     Estimated blood loss: none. Procedure:                Pre-Anesthesia Assessment:                           - Prior to the procedure, a History and Physical                            was performed, and patient medications and                            allergies were reviewed. The patient's tolerance of                            previous anesthesia was also reviewed. The risks                            and benefits of the procedure and the sedation                            options and risks were discussed with the patient.                            All questions were answered, and informed consent                            was obtained. Prior Anticoagulants: The patient has                            taken no previous anticoagulant or antiplatelet                            agents. ASA Grade Assessment: II - A patient with                            mild systemic disease. After reviewing the risks                            and benefits, the patient was  deemed in                            satisfactory condition to undergo the procedure.                           - Sedation was administered by an anesthesia                            professional. Deep sedation was attained.                           After obtaining informed consent, the colonoscope                            was passed under direct vision. Throughout the                             procedure, the patient's blood pressure, pulse, and                            oxygen saturations were monitored continuously. The                            CF-HQ190L ([id number] Olympus colonoscope was                            introduced through the anus and advanced to the                            30-40 cm into the ileum. The colonoscopy was                            technically difficult and complex due to                            significant looping. Successful completion of the                            procedure was aided by using manual pressure and                            straightening and shortening the scope to obtain                            bowel loop reduction. The patient tolerated the                            procedure well. The quality of the bowel                            preparation was good. The terminal ileum, ileocecal  valve, appendiceal orifice, and rectum were                            photographed. Scope In: 10:13:53 AM Scope Out: 11:04:09 AM Scope Withdrawal Time: 0 hours 45 minutes 9 seconds  Total Procedure Duration: 0 hours 50 minutes 16 seconds  Findings:      The ileum, 30 cm from the ileocecal valve contained hematin (altered       blood/coffee-ground-like material).      The entire examined colon appeared normal.      During the procedure, looping in the TI resulted in patient vomiting.       She vomited a significant amount of clear fluid and the decision was       made to intubate. It is estimated that the colonoscope was advanced to       30-50 cm into the TI. There was clear evidence of blood in the form of       hematin mixed with reddish blood and mucus. Advancement of the       colonoscope was performed in the left lateral and supine positions while       applying intermittent abdominal pressure. No further advancement was       possible and a source of the bleeding was not identified, however, it        was felt to be in the distal portion of the ileum. Impression:               - Blood in the ileum, 30 cm from the ileocecal                            valve.                           - The entire examined colon is normal.                           - No specimens collected. Recommendation:           - Return patient to hospital ward for ongoing care.                           - Resume regular diet.                           - Continue present medications.                           - Monitor HGB and transfuse as necessary.                           - The patient will require a retrograde balloon                            enteroscopy. Procedure Code(s):        --- Professional ---                           [phone], Colonoscopy, flexible; diagnostic, including  collection of specimen(s) by brushing or washing,                            when performed (separate procedure) Diagnosis Code(s):        --- Professional ---                           K92.1, Melena (includes Hematochezia)                           K92.2, Gastrointestinal hemorrhage, unspecified CPT copyright 2019 American Medical Association. All rights reserved. The codes documented in this report are preliminary and upon coder review may  be revised to meet current compliance requirements. Jeani Hawking, MD Jeani Hawking, MD 12/02/2020 11:16:30 AM This report has been signed electronically. Number of Addenda: 0

## 2020-12-03 ENCOUNTER — Other Ambulatory Visit (HOSPITAL_COMMUNITY): Payer: Self-pay | Admitting: Cardiology

## 2020-12-03 ENCOUNTER — Inpatient Hospital Stay (HOSPITAL_COMMUNITY): Payer: BC Managed Care – PPO

## 2020-12-03 DIAGNOSIS — I255 Ischemic cardiomyopathy: Secondary | ICD-10-CM

## 2020-12-03 DIAGNOSIS — I5042 Chronic combined systolic (congestive) and diastolic (congestive) heart failure: Secondary | ICD-10-CM

## 2020-12-03 LAB — CBC WITH DIFFERENTIAL/PLATELET
Abs Immature Granulocytes: 0.03 10*3/uL (ref 0.00–0.07)
Basophils Absolute: 0 10*3/uL (ref 0.0–0.1)
Basophils Relative: 0 %
Eosinophils Absolute: 0.1 10*3/uL (ref 0.0–0.5)
Eosinophils Relative: 2 %
HCT: 24.5 % — ABNORMAL LOW (ref 36.0–46.0)
Hemoglobin: 8.1 g/dL — ABNORMAL LOW (ref 12.0–15.0)
Immature Granulocytes: 0 %
Lymphocytes Relative: 23 %
Lymphs Abs: 1.5 10*3/uL (ref 0.7–4.0)
MCH: 30.7 pg (ref 26.0–34.0)
MCHC: 33.1 g/dL (ref 30.0–36.0)
MCV: 92.8 fL (ref 80.0–100.0)
Monocytes Absolute: 0.6 10*3/uL (ref 0.1–1.0)
Monocytes Relative: 8 %
Neutro Abs: 4.5 10*3/uL (ref 1.7–7.7)
Neutrophils Relative %: 67 %
Platelets: 151 10*3/uL (ref 150–400)
RBC: 2.64 MIL/uL — ABNORMAL LOW (ref 3.87–5.11)
RDW: 16.2 % — ABNORMAL HIGH (ref 11.5–15.5)
WBC: 6.8 10*3/uL (ref 4.0–10.5)
nRBC: 0 % (ref 0.0–0.2)

## 2020-12-03 LAB — TYPE AND SCREEN
ABO/RH(D): A NEG
Antibody Screen: NEGATIVE
Unit division: 0
Unit division: 0

## 2020-12-03 LAB — BPAM RBC
Blood Product Expiration Date: 202210082359
Blood Product Expiration Date: 202210132359
ISSUE DATE / TIME: 202210061027
ISSUE DATE / TIME: 202210081032
Unit Type and Rh: 600
Unit Type and Rh: 600

## 2020-12-03 MED ORDER — IOHEXOL 350 MG/ML SOLN
75.0000 mL | Freq: Once | INTRAVENOUS | Status: AC | PRN
  Administered 2020-12-03: 75 mL via INTRAVENOUS

## 2020-12-03 NOTE — Consult Note (Addendum)
Cardiology Consultation:   Patient ID: Catherine Mays MRN: [id number]; DOB: 03/26/1965  Admit date: 11/28/2020 Date of Consult: 12/03/2020  PCP:  Benita Stabile, MD   [facility] Providers Cardiologist:  Verne Carrow, MD  Electrophysiologist:  Regan Lemming, MD  {  Patient Profile:   Catherine Mays is a 55 y.o. 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 Ashley Royalty.  History of Present Illness:   Catherine Mays is a 55 year old female with the above history who is followed by Dr. Shirlee Latch and Dr. Elberta Fortis.  Admitted in 08/2015 with a late presenting STEMI.  Cardiac catheterization showed 100% stenosis in the mid LAD with mild nonobstructive disease RCA.  She underwent successful PTCA/DES to the proximal to mid LAD.  Echo at that time showed LVEF of 30-35% the apical anteroseptal and anterior myocardium as well as grade 1 diastolic dysfunction.  She also had.  MRI atrial fibrillation and was on amiodarone and Xarelto at that time.  She was readmitted in 11/2015 due to concerns for low output heart failure.  However, symptoms resolved after stopping Amiodarone.  Echo showed persistently low EF of 35%.  Right heart cath at that time showed preserved cardiac output but the PVWP was high due to prominent V waves, presumably from significant MR.  Of note, she was still in atrial fibrillation during this admission.  She underwent placement of a St. Jude ICD in 12/2015.  TEE was performed  in 09/2016 to evaluate the mitral valve and showed LVEF of 30% and moderate MR (probably functional).  She was noted to have very frequent PVCs on device interrogation in 2018.  Holter monitor in 01/2017 showed a 41% PVC burden.  Therefore, Coreg was stopped and she was started on Sotalol.  She ultimately underwent PVC ablation in 03/2017 but did have recurrence of PVCs so Sotalol was increased.  She was readmitted in 04/2017 with chest pain.  Cardiac catheterization showed patent LAD stent with otherwise nonobstructive disease.  GDMT has been limited due to soft BP.  Patient was tried in the past but had to be stopped due to chronic yeast infection.  In 2021, she was found to be anemic with iron deficiency.  Hemoglobin got as low as 8. Hemoccult was positive.  She required treatment with IV iron and a blood transfusion.  EGD, colonoscopy, and capsule endoscopy were all unremarkable.  Last echo in 02/2020 showed LVEF of 25 to 30% with akinesis of the mid apical anterior wall as well as the anterior septal and inferior septal walls, grade 2 diastolic dysfunction, and mild to moderate MR. RV normal.  Patient was last seen by Dr. Shirlee Latch in 09/2020 time she was doing relatively well from a cardiac standpoint.  She did report an episode of chest heaviness about 2 weeks prior to this visit which: Coincided with a period of high stress but denies any chest pain since then. She also reported dyspnea when walking up hills or stairs but not when walking on flat ground. In addition, she  noted significant fatigue as well as lightheadedness with quick position changes.  Weight and BP were stable at that time.  Losartan and Lasix were stopped and she was started on Entresto 24-26 mg twice daily.  She was also evaluated for the [facility] and ANTHEM studies.  Patient presented to the ED on 11/28/2020 with reports of dizziness, clammy, and episode of dark stools.  She was found to be anemic with hemoglobin of 8.6 (baseline around  10-11). Rectal exam last formed dark-colored stool was found to be positive.  BUN was elevated 50 (compared to 29 and 09/2020).  She was admitted for further evaluation. She was transfused 1 unit of PRBC on 11/29/2020. Hemoglobin then dropped to 7.0 on 12/01/2020 and patient required another unit of PRBCs.  GI was consulted and she underwent EGD on 11/30/2020 which showed no source of bleeding.  Capsule endoscopy on 12/01/2020 showed blood in the mid/distal small bowel and proximal colon.  Colonoscopy on 12/02/2020 showed blood in the terminal terminal ileum but no clear source.  It is suspected that the source of bleeding is in the distal small bowel that is not able to be reached via routine colonoscope.  GI is asked IR to consider an arteriogram to localize and treat the source of her bleeding. Cardiology has been consulted for assistance with anticoagulation.  At the time of this evaluation, patient resting comfortably in no acute distress. We discussed what led to this admission. She states she had just been feeling "worse than normal" for the last 1.5 to 2 weeks prior to admission. She reported she had been more fatigued than usual. Then on Tuesday 10/4/022 she had a couple of episodes of dark stools. Later that night after getting up from the couch she felt dizzy but this is not unusual for her so she did not think much about it. However, as she was walking the dizziness got worse. She sat down and then briefly passed out. When she regained consciousness, she states she was feeling a little nauseous but not vomiting. Given her significant heart history, she was concerned that this was due to her CHF so she went to the ED and that is when she was found to be anemic. She states she feels she has been doing well from a cardiac standpoint though. She has chronic dyspnea on exertion when walking up an incline/stairs but this is stable. No shortness of breath at rest or when walking on a flat surface. No orthopnea,  PND, or edema. Of note, she does state that she restarted her Lasix after last visit with Dr. Shirlee Latch because she started to notice a little "puffiness" in her legs. No edema since restarting this. No chest pain or palpitations. No other syncope. No ICD shocks. No recent fevers or illnesses. No other abnormal bleeding.  Past Medical History:  Diagnosis Date   AICD (automatic cardioverter/defibrillator) present    St. Jude   CAD in native artery    a. late recognition of presentation of STEMI 08/2015 s/p DES to LAD, mild residual mRCA.   Chronic systolic CHF (congestive heart failure) (HCC)    CKD (chronic kidney disease), stage III (HCC)    Hypotension    a. preventing med titration for HF.   Hypothyroidism 09/24/2015   Ischemic cardiomyopathy    Myocardial infarction [facility]) 08/2015   PAF (paroxysmal atrial fibrillation) (HCC) 09/24/2015   a. dx at time of STEMI 08/2015.   Pre-diabetes    Presence of permanent cardiac pacemaker  patient has ICD   PVC's (premature ventricular contractions)     Past Surgical History:  Procedure Laterality Date   BIOPSY  06/16/2019   Procedure: BIOPSY;  Surgeon: Corbin Ade, MD;  Location: [facility];  Service: Endoscopy;;   CARDIAC CATHETERIZATION N/A 09/20/2015   Procedure: Left Heart Cath and Coronary Angiography;  Surgeon: Kathleene Hazel, MD;  Location: [facility] INVASIVE CV LAB;  Service: Cardiovascular;  Laterality: N/A;   CARDIAC CATHETERIZATION N/A 09/20/2015   Procedure: Coronary Stent Intervention;  Surgeon: Kathleene Hazel, MD;  Location: [facility] INVASIVE CV LAB;  Service: Cardiovascular;  Laterality: N/A;   CARDIAC CATHETERIZATION N/A 09/21/2015   Procedure: Left Heart Cath and Coronary Angiography;  Surgeon: Tonny Bollman, MD;  Location: [facility] INVASIVE CV LAB;  Service: Cardiovascular;  Laterality: N/A;   CARDIAC CATHETERIZATION N/A 11/26/2015   Procedure: Right Heart Cath;  Surgeon: [name], MD;  Location: [facility] INVASIVE CV LAB;   Service: Cardiovascular;  Laterality: N/A;   COLONOSCOPY WITH PROPOFOL N/A 11/23/2017   Dr. Jena Gauss: Diverticulosis, internal hemorrhoids, next colonoscopy in 10 years   COLONOSCOPY WITH PROPOFOL N/A 08/10/2019   Procedure: COLONOSCOPY WITH PROPOFOL;  Surgeon: Corbin Ade, MD;  Diverticulosis in sigmoid colon, nonbleeding internal hemorrhoids, otherwise normal exam.     CORONARY STENT PLACEMENT  09/20/2015   EP IMPLANTABLE DEVICE N/A 01/23/2016   Procedure: ICD Implant;  Surgeon: Will Jorja Loa, MD;  Location: [facility] INVASIVE CV LAB;  Service: Cardiovascular;  Laterality: N/A;   ESOPHAGOGASTRODUODENOSCOPY (EGD) WITH PROPOFOL N/A 06/16/2019   Procedure: ESOPHAGOGASTRODUODENOSCOPY (EGD) WITH PROPOFOL;  Surgeon: Corbin Ade, MD;  Normal esophagus, small hiatal hernia, normal examined stomach, normal examined duodenum.  Gastric biopsy with slight chronic inflammation, duodenal biopsy with slight intramucosal Brunner gland hyperplasia.   ESOPHAGOGASTRODUODENOSCOPY (EGD) WITH PROPOFOL N/A 11/30/2020   Procedure: ESOPHAGOGASTRODUODENOSCOPY (EGD) WITH PROPOFOL;  Surgeon: Jenel Lucks, MD;  Location: [facility] ENDOSCOPY;  Service: Gastroenterology;  Laterality: N/A;   GIVENS CAPSULE STUDY N/A 11/23/2019    Surgeon: Corbin Ade, MD; essentially unremarkable except for tiny erosion in the proximal small bowel.   GIVENS CAPSULE STUDY  11/30/2020   Procedure: GIVENS CAPSULE STUDY;  Surgeon: Jenel Lucks, MD;  Location: [facility] ENDOSCOPY;  Service: Gastroenterology;;   LEFT HEART CATH AND CORONARY ANGIOGRAPHY N/A 12/18/2017   Procedure: LEFT HEART CATH AND CORONARY ANGIOGRAPHY;  Surgeon: [name], MD;  Location: [facility] INVASIVE CV LAB;  Service: Cardiovascular;  Laterality: N/A;   PVC ABLATION N/A 04/02/2017   Procedure: PVC ABLATION;  Surgeon: Regan Lemming, MD;  Location: [facility] INVASIVE CV LAB;  Service: Cardiovascular;  Laterality: N/A;   TEE WITHOUT CARDIOVERSION N/A 10/08/2016   Procedure:  TRANSESOPHAGEAL ECHOCARDIOGRAM (TEE);  Surgeon: [name], MD;  Location: [facility] ENDOSCOPY;  Service: Cardiovascular;  Laterality: N/A;   THYROIDECTOMY       Home Medications:  Prior to Admission medications   Medication Sig Start Date End Date Taking? Authorizing Provider  acetaminophen (TYLENOL) 500 MG tablet Take 1,000 mg by mouth every 6 (six) hours as needed for mild pain.   Yes [provider]  apixaban (ELIQUIS) 5 MG TABS tablet Take 1 tablet (5 mg total) by mouth 2 (two) times daily. 03/23/20  Yes [name], MD  buPROPion (WELLBUTRIN XL) 300 MG 24 hr tablet Take 300 mg by mouth daily. 05/10/19  Yes [provider]  Calcium Carb-Cholecalciferol (CALCIUM + D3 PO) Take 1 tablet by mouth every morning.  Yes [provider]  Coenzyme Q10 (COQ10) 200 MG CAPS Take 200 mg by mouth daily.   Yes [provider]  dexlansoprazole (DEXILANT) 60 MG capsule Take 1 capsule (60 mg total) by mouth daily. 04/18/20  Yes Letta Median, PA-C  fexofenadine (ALLEGRA) 180 MG tablet Take 180 mg by mouth daily.   Yes [provider]  furosemide (LASIX) 20 MG tablet Take 20 mg by mouth daily. 10/27/20  Yes [provider]  ibuprofen (ADVIL) 200 MG tablet Take 400 mg by mouth every 6 (six) hours as needed for headache or mild pain.   Yes [provider]  losartan (COZAAR) 25 MG tablet Take 25 mg by mouth daily.   Yes [provider]  nitroGLYCERIN (NITROSTAT) 0.4 MG SL tablet PLACE 1 TABLET UNDER THE TONGUE EVERY 5 MINUTES AS NEEDED FOR CHEST PAIN Patient taking differently: Place 0.4 mg under the tongue every 5 (five) minutes as needed for chest pain. 04/13/20  Yes [name], MD  rosuvastatin (CRESTOR) 10 MG tablet TAKE 1 TABLET(10 MG) BY MOUTH DAILY Patient taking differently: Take 10 mg by mouth daily. 05/15/20  Yes [name], MD  sacubitril-valsartan (ENTRESTO) 24-26 MG Take 1 tablet by mouth 2 (two) times daily.  10/19/20  Yes [name], MD  sotalol (BETAPACE) 160 MG tablet TAKE 1 TABLET BY MOUTH EVERY MORNING AND 1/2 TABLET EVERY EVENING Patient taking differently: Take 80-160 mg by mouth 2 (two) times daily. 160mg  in the morning and 80mg  in the evening 10/09/20  Yes [name], MD  spironolactone (ALDACTONE) 25 MG tablet Take 1 tablet (25 mg total) by mouth daily. 10/23/20  Yes [name], MD  thyroid (ARMOUR) 120 MG tablet Take 120 mg by mouth daily before breakfast.   Yes [provider]  traZODone (DESYREL) 50 MG tablet Take 0.5 tablets (25 mg total) by mouth at bedtime as needed for sleep. 11/26/15  Yes Clegg, Amy D, NP  triamcinolone (NASACORT) 55 MCG/ACT AERO nasal inhaler Place 2 sprays into the nose daily.    Yes [provider]    Inpatient Medications: Scheduled Meds:  sodium chloride   Intravenous Once   buPROPion  300 mg Oral Daily   loratadine  10 mg Oral Daily   pantoprazole  40 mg Intravenous Q12H   rosuvastatin  10 mg Oral Daily   sacubitril-valsartan  1 tablet Oral BID   sotalol  160 mg Oral Q24H   And   sotalol  80 mg Oral Q24H   spironolactone  25 mg Oral Daily   thyroid  120 mg Oral QAC breakfast   Continuous Infusions:  lactated ringers 10 mL/hr at 12/02/20 0943   PRN Meds: acetaminophen **OR** acetaminophen, ondansetron (ZOFRAN) IV, traZODone  Allergies:    Allergies  Allergen Reactions   Paxil [Paroxetine] Palpitations   Morphine And Related Nausea And Vomiting   Percocet [Oxycodone-Acetaminophen] Nausea And Vomiting   Cefdinir Nausea Only   Zolpidem Other (See Comments)    Doesn't work for patient at all    Social History:   Social History   Socioeconomic History   Marital status: Married    Spouse name: Not on file   Number of children: Not on file   Years of education: Not on file   Highest education level: Not on file  Occupational History   Not on file  Tobacco Use   Smoking status: Never   Smokeless tobacco:  Never  Vaping Use  Vaping Use: Never used  Substance and Sexual Activity   Alcohol use: No   Drug use: No   Sexual activity: Not on file  Other Topics Concern   Not on file  Social History Narrative   Not on file   Social Determinants of Health   Financial Resource Strain: Low Risk    Difficulty of Paying Living Expenses: Not hard at all  Food Insecurity: No Food Insecurity   Worried About Programme researcher, broadcasting/film/video in the Last Year: Never true   Ran Out of Food in the Last Year: Never true  Transportation Needs: No Transportation Needs   Lack of Transportation (Medical): No   Lack of Transportation (Non-Medical): No  Physical Activity: Insufficiently Active   Days of Exercise per Week: 3 days   Minutes of Exercise per Session: 30 min  Stress: No Stress Concern Present   Feeling of Stress : Not at all  Social Connections: Socially Integrated   Frequency of Communication with Friends and Family: More than three times a week   Frequency of Social Gatherings with Friends and Family: More than three times a week   Attends Religious Services: More than 4 times per year   Active Member of [employer] or Organizations: Yes   Attends Engineer, structural: More than 4 times per year   Marital Status: Married  Catering manager Violence: Not At Risk   Fear of Current or Ex-Partner: No   Emotionally Abused: No   Physically Abused: No   Sexually Abused: No    Family History:   Family History  Problem Relation Age of Onset   Heart attack Father    Arrhythmia Mother    Lung cancer Mother        bronchiectasis   Colon cancer Neg Hx    Colon polyps Neg Hx      ROS:  Please see the history of present illness.  Review of Systems  Constitutional:  Positive for malaise/fatigue. Negative for chills and fever.  HENT:  Negative for congestion.   Respiratory:  Positive for shortness of breath. Negative for cough and hemoptysis.   Cardiovascular:  Negative for chest pain, palpitations,  orthopnea, leg swelling and PND.  Gastrointestinal:  Positive for nausea. Negative for blood in stool, melena and vomiting.  Genitourinary:  Negative for hematuria.  Musculoskeletal:  Negative for myalgias.  Neurological:  Positive for dizziness and loss of consciousness.  Endo/Heme/Allergies:  Does not bruise/bleed easily.  Psychiatric/Behavioral:  Negative for substance abuse.    Physical Exam/Data:   Vitals:   12/02/20 2332 12/03/20 0327 12/03/20 0806 12/03/20 1154  BP: (!) 100/50 (!) 97/43 (!) 91/59 (!) 88/55  Pulse: 64 67 82 68  Resp: Temp: 98.2 F (36.8 C) 98.9 F (37.2 C) 99 F (37.2 C) 98.9 F (37.2 C)  TempSrc: Oral Oral Oral Oral  SpO2: 98% 98% 97% 99%  Weight:  70.6 kg    Height:        Intake/Output Summary (Last 24 hours) at 12/03/2020 1505 Last data filed at 12/03/2020 1300 Gross per 24 hour  Intake 480 ml  Output --  Net 480 ml   Last 3 Weights 12/03/2020 12/02/2020 12/02/2020  Weight (lbs) 155 lb 10.3 oz 157 lb 6.5 oz 157 lb 6.5 oz  Weight (kg) 70.6 kg 71.4 kg 71.4 kg     Body mass index is 22.98 kg/m.  General: 55 y.o. thin Caucasian female resting comfortably in no acute distress. HEENT:  Normocephalic and atraumatic. Sclera clear.  Neck: Supple. No carotid bruits. No JVD. Heart: RRR. Distinct S1 and S2. No murmurs, gallops, or rubs. Radial pulses 2+ and equal bilaterally. Lungs: No increased work of breathing. Clear to ausculation bilaterally. No wheezes, rhonchi, or rales.  Abdomen: Soft, non-distended, and non-tender to palpation. Bowel sounds present.  MSK: Normal strength and tone for age. Extremities: No lower extremity edema.    Skin: Warm and dry. Neuro: Alert and oriented x3. No focal deficits. Psych: Normal affect. Responds appropriately.  EKG:  The EKG was personally reviewed and demonstrates: Sinus bradycardia, rate 58 bpm, with left axis deviation and non-specific IVCD but no acute ST/T changes compared to prior  tracing.  Telemetry:  Telemetry was personally reviewed and demonstrates:  Normal sinus rhythm with rate in the 60s to 70s. One 6 beat run of NSVT noted.  Relevant CV Studies:  Echocardiogram 03/23/2020:  1. Left ventricular ejection fraction, by estimation, is 25 to 30%. The  left ventricle has severely decreased function. The left ventricle  demonstrates regional wall motion abnormalities the anteroseptal and  inferoseptal walls are akinetic. The apex is  akinetic. The mid to apical anterior wall is akinetic. The left  ventricular internal cavity size was mildly dilated. Left ventricular  diastolic parameters are consistent with Grade II diastolic dysfunction  (pseudonormalization).   2. Right ventricular systolic function is normal. The right ventricular  size is normal. There is normal pulmonary artery systolic pressure. The  estimated right ventricular systolic pressure is 33.5 mmHg.   3. Left atrial size was mild to moderately dilated.   4. The mitral valve is normal in structure. Mild to moderate mitral valve  regurgitation. No evidence of mitral stenosis.   5. The aortic valve is tricuspid. Aortic valve regurgitation is not  visualized. No aortic stenosis is present.   6. The inferior vena cava is normal in size with greater than 50%  respiratory variability, suggesting right atrial pressure of 3 mmHg.   Laboratory Data:  High Sensitivity Troponin:  No results for input(s): TROPONINIHS in the last 720 hours.   Chemistry Recent Labs  Lab 11/28/20 2319 11/30/20 0108 12/01/20 0029  NA 134*  --  137  K 4.3  --  3.6  CL 101  --  106  CO2 25  --  24  GLUCOSE 162*  --  110*  BUN 50*  --  34*  CREATININE 0.95  --  0.93  CALCIUM 8.7*  --  8.4*  MG  --  1.9  --   GFRNONAA >60  --  >60  ANIONGAP 8  --  7    Recent Labs  Lab 11/28/20 2319  PROT 5.8*  ALBUMIN 3.5  AST 14*  ALT 12  ALKPHOS 47  BILITOT 0.3   Lipids No results for input(s): CHOL, TRIG, HDL, LABVLDL,  LDLCALC, CHOLHDL in the last 168 hours.  Hematology Recent Labs  Lab 12/01/20 0029 12/01/20 1839 12/02/20 0119 12/02/20 2108 12/03/20 1011  WBC 8.5  --  7.7  --  6.8  RBC 2.29*  --  2.55*  --  2.64*  HGB 7.0*   < > 7.7* 7.5* 8.1*  HCT 21.2*   < > 22.7* 23.3* 24.5*  MCV 92.6  --  89.0  --  92.8  MCH 30.6  --  30.2  --  30.7  MCHC 33.0  --  33.9  --  33.1  RDW 14.2  --  15.4  --  16.2*  PLT 155  --  129*  --  151   < > = values in this interval not displayed.   Thyroid No results for input(s): TSH, FREET4 in the last 168 hours.  BNPNo results for input(s): BNP, PROBNP in the last 168 hours.  DDimer No results for input(s): DDIMER in the last 168 hours.   Radiology/Studies:  CT Angio Abd/Pel w/ and/or w/o  Result Date: 12/03/2020 CLINICAL DATA:  Abdominal pain, GI bleed EXAM: CTA ABDOMEN AND PELVIS WITHOUT AND WITH CONTRAST TECHNIQUE: Multidetector CT imaging of the abdomen and pelvis was performed using the standard protocol during bolus administration of intravenous contrast. Multiplanar reconstructed images and MIPs were obtained and reviewed to evaluate the vascular anatomy. CONTRAST:  9mL OMNIPAQUE IOHEXOL 350 MG/ML SOLN COMPARISON:  None. FINDINGS: VASCULAR Aorta: Normal caliber aorta without aneurysm, dissection, vasculitis or significant stenosis. Celiac: Patent without evidence of aneurysm, dissection, vasculitis or significant stenosis. The common hepatic artery arises directly from the aorta, an anatomic variant. SMA: Patent without evidence of aneurysm, dissection, vasculitis or significant stenosis. Renals: Both renal arteries are patent without evidence of aneurysm, dissection, vasculitis, fibromuscular dysplasia or significant stenosis. IMA: Patent without evidence of aneurysm, dissection, vasculitis or significant stenosis. Inflow: Patent without evidence of aneurysm, dissection, vasculitis or significant stenosis. Proximal Outflow: Bilateral common femoral and visualized  portions of the superficial and profunda femoral arteries are patent without evidence of aneurysm, dissection, vasculitis or significant stenosis. Veins: Iliac venous system and IVC patent. Renal veins patent. There is mild narrowing of the left renal vein centrally. Patent hepatic veins portal vein, SM V, splenic vein. Review of the MIP images confirms the above findings. NON-VASCULAR Lower chest: Transvenous pacing lead extends towards the RV apex. No pleural or pericardial effusion. Hepatobiliary: No focal liver abnormality is seen. No gallstones, gallbladder wall thickening, or biliary dilatation. Pancreas: Unremarkable. No pancreatic ductal dilatation or surrounding inflammatory changes. Spleen: Normal in size without focal abnormality. Adrenals/Urinary Tract: Adrenal glands unremarkable. 1 cm probable cyst in the mid left kidney. No urolithiasis or hydronephrosis. Urinary bladder physiologically distended. Stomach/Bowel: No evidence of acute/active hemorrhage into the bowel. The stomach is physiologically distended, unremarkable. The small bowel is decompressed. Nondilated appendix containing small appendicoliths without adjacent inflammatory change. The colon is nondilated, unremarkable. Lymphatic: No abdominal or pelvic adenopathy. Reproductive: Uterus and bilateral adnexa are unremarkable. Other: Bilateral pelvic phleboliths.  No ascites.  No free air. Musculoskeletal: No acute or significant osseous findings. IMPRESSION: No evidence of active GI bleed or other acute finding. Electronically Signed   By: Corlis Leak M.D.   On: 12/03/2020 13:30     Assessment and Plan:   Chronic Combined CHF Ischemic Cardiomyopathy S/p ICD - Most recent Echo in 02/2020 showed 25 to 30% with akinesis of the mid apical anterior wall as well as the anterior septal and inferior septal walls, grade 2 diastolic dysfunction, and mild to moderate MR. RV normal. - Euvolemic on exam. BP is soft with systolic BP in the high 80s  to 90s. Per review of office notice, systolic BP usually ranges from 90 to low 100s so within baseline. Patient asymptomatic. - Discussed with Dr. Gala Romney. Will hold Entresto for now. OK to continue Spironolactone 25mg  daily but will place hold parameters to hold if systolic BP  <90. - Continue Sotalol for frequent PVCs as below. - Continue to monitor volume status closely.  Of note, patient is scheduled to meet with , RN with the [facility] research study, on Wednesday 12/05/2020  and is scheduled to have repeat Echo and carotid dopplers done on that day as well. She is very excited about this and does not want to miss. Reassured her that if she is still here on Wednesday, we can arrange for her to go ahead an get Echo and carotid ultrasounds done.  CAD - History of late presenting anterior STEMI in 2017 s/p DES to LAD.  - No angina.  - Not on aspirin at home due to need for DOAC (but this is currently on hold due to acute anemia). - Continue beta-blocker and high-intensity statin.   Paroxysmal Atrial Fibrillation - Maintaining sinus rhythm. - Continue Sotalol 160mg  in the morning and 80mg  in the evening - On Eliquis at home but currently on hold due to acute anemia with GI bleed. Restart when OK with GI. Of note, she has had GI bleeding in the best. May be a candidate for Watchman procedure in the future.  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 S/p PVC ablation in 2019 with recurrence. Has been maintained with Sotalol. - One 6 beat run of SVT noted on telemetry but otherwise not much ventricular ectopy. - Potassium 3.6 yesterday. Magnesium 1.9 on 10/7. - Continue Sotalol 160mg  in the morning and 80mg  in the evening.   Hyperlipidemia - Continue Crestor 10mg  daily.  Acute Anemia  GI Bleed - Patient present with dark stools.  - Hemoglobin 8.6 on admission (baseline 10-11)but dropped to 7.0 on 12/01/2020. Hemoccult positive. - GI following. EGD on  11/30/2020 which showed no source of bleeding.  Capsule endoscopy on 12/01/2020 showed blood in the mid/distal small bowel and proximal colon.  Colonoscopy on 12/02/2020 showed blood in the terminal terminal ileum but no clear source.  It is suspected that the source of bleeding is in the distal small bowel that is not able to be reached via routine colonoscope.  GI is asked IR to consider an arteriogram to localize and treat the source of her bleeding.  Hypothyroidism - Continue Armour per primary team.   Risk Assessment/Risk Scores:   New York Heart Association (NYHA) Functional Class NYHA Class II  CHA2DS2-VASc Score = 3  This indicates a 3.2% annual risk of stroke. The patient's score is based upon: CHF History: 1 HTN History: 0 Diabetes History: 0 Stroke History: 0 Vascular Disease History: 1 Age Score: 0 Gender Score: 1   For questions or updates, please contact [facility] Please consult [email or web address] for contact info under    Signed, , PA-C  12/03/2020 3:05 PM  Patient seen and examined with the above-signed Advanced Practice Provider and/or Housestaff. I personally reviewed laboratory data, imaging studies and relevant notes. I independently examined the patient and formulated the important aspects of the plan. I have edited the note to reflect any of my changes or salient points. I have personally discussed the plan with the patient and/or family.  55 y/o woman with h/o systolic HF due to ischemic CM, PAF on sotalol and Eliquis admitted with acute GI bleed   At baseline NYHA II-III. GDMT has been limited by low BPs.  Now s/p 2u RBCs and EGD/colon/capsule. Plan for possible IR embolization distal SB & proximal colon vasculature  General:  Lying in bed  No resp difficulty HEENT: normal Neck: supple. no JVD. Carotids 2+ bilat; no bruits. No lymphadenopathy or thryomegaly appreciated. Cor: PMI nondisplaced. Regular rate & rhythm. No rubs, gallops or  murmurs. Lungs: clear Abdomen: soft, nontender, nondistended. No hepatosplenomegaly.  No bruits or masses. Good bowel sounds. Extremities: no cyanosis, clubbing, rash, edema Neuro: alert & orientedx3, cranial nerves grossly intact. moves all 4 extremities w/o difficulty. Affect pleasant  Agree with holding Eliquis until cleared by GI. Continue sotalol. Given SBP in 7o-80 range range would stop Entresto and spiro. Her baseline SBP at home is 85-95. I took her BP personally and was 89/71 so will not start midodrine currently. Can start as needed. Volume status looks replete. Can diurese as needed as well. If she is felt to be at high risk for rebleeding can consider [place] down the road.    Arvilla Meres, MD  3:42 PM

## 2020-12-03 NOTE — Progress Notes (Signed)
Daily Rounding Note  12/03/2020, 11:59 AM  LOS: 2 days   SUBJECTIVE:   Chief complaint:    GIB, melena.  No black or bloody stools.  No N/V.  Vague esoph/gastric sensation (not pain).  Overall feels well  OBJECTIVE:         Vital signs in last 24 hours:    Temp:  [98.2 F (36.8 C)-99.4 F (37.4 C)] 98.9 F (37.2 C) (10/10 1154) Pulse Rate:  [64-82] 68 (10/10 1154) Resp:  [17-19] 17 (10/10 1154) BP: (85-100)/(43-59) 88/55 (10/10 1154) SpO2:  [97 %-99 %] 99 % (10/10 1154) Weight:  [70.6 kg] 70.6 kg (10/10 0327) Last BM Date: 12/01/20 Filed Weights   12/02/20 0403 12/02/20 0939 12/03/20 0327  Weight: 71.4 kg 71.4 kg 70.6 kg   General: NAD   Heart: RRR Chest: no dyspnea Abdomen: soft, NT.    Extremities: no CCE Neuro/Psych:  oriented x 3.  Appropriate.    Intake/Output from previous day: 10/09 0701 - 10/10 0700 In: 450 [I.V.:450] Out: 100   Intake/Output this shift: Total I/O In: 240 [P.O.:240] Out: -   Lab Results: Recent Labs    12/01/20 0029 12/01/20 1839 12/02/20 0119 12/02/20 2108 12/03/20 1011  WBC 8.5  --  7.7  --  6.8  HGB 7.0*   < > 7.7* 7.5* 8.1*  HCT 21.2*   < > 22.7* 23.3* 24.5*  PLT 155  --  129*  --  151   < > = values in this interval not displayed.   BMET Recent Labs    12/01/20 0029  NA 137  K 3.6  CL 106  CO2 24  GLUCOSE 110*  BUN 34*  CREATININE 0.93  CALCIUM 8.4*   LFT No results for input(s): PROT, ALBUMIN, AST, ALT, ALKPHOS, BILITOT, BILIDIR, IBILI in the last 72 hours. PT/INR No results for input(s): LABPROT, INR in the last 72 hours. Hepatitis Panel No results for input(s): HEPBSAG, HCVAB, HEPAIGM, HEPBIGM in the last 72 hours.  Studies/Results: No results found.  Scheduled Meds:  sodium chloride   Intravenous Once   buPROPion  300 mg Oral Daily   loratadine  10 mg Oral Daily   pantoprazole  40 mg Intravenous Q12H   rosuvastatin  10 mg Oral Daily    sacubitril-valsartan  1 tablet Oral BID   sotalol  160 mg Oral Q24H   And   sotalol  80 mg Oral Q24H   spironolactone  25 mg Oral Daily   thyroid  120 mg Oral QAC breakfast   Continuous Infusions:  lactated ringers 10 mL/hr at 12/02/20 0943   PRN Meds:.acetaminophen **OR** acetaminophen, ondansetron (ZOFRAN) IV, traZODone  ASSESMENT:      Melena, GI bleed. 11/30/2020 EGD: 3 cm hiatal hernia.  Gastric polyps without stigmata of bleeding, not removed.  Friable duodenum but otherwise normal.  Bleeding source unclear. 12/02/2020 colonoscopy with hematin at the ileum, 30 cm from IC valve.  Extent of exam was 30 to 50 cm into the ileum.  Colon itself normal.  Developed nonbloody vomiting and subsequently intubated for airway protection during procedure.  Blood loss anemia.  Background IDA.  S/p 2 PRBCs, 10/6 and 10/8. Hgb 8.6... 7.0... 8.4 ... 7.5 >> 8.1 iron and iron saturations elevated.  Ferritin 70.  Elevated iron saturation.  Folate, B12 normal.    Isolated thrombocytopenia to 129K, rebounded to 151 today.  Azotemia w BUN to 50 on 10/5, 34 on 10/8.  Chronic Eliquis. On hold.  Hx A fib, CAD and stenting, diastolic CHF.     PLAN     CTAP w angio completed.  Await reading.   ? Cardiac consult re: risk/benefit of Eliquis?      Catherine Mays  12/03/2020, 11:59 AM Phone [phone]

## 2020-12-03 NOTE — Progress Notes (Signed)
PROGRESS NOTE    Kora Groom  TIR:[id number] DOB: 03/20/1965 DOA: 11/28/2020 PCP: Benita Stabile, MD   Brief Narrative: HPI per Dr. Glade Nurse Peyser is a 55 y.o. female with medical history significant for chronic combined systolic/diastolic heart failure with LV EF 25 to 30% status post AICD, paroxysmal atrial fibrillation chronically anticoagulated on Eliquis, chronic anemia with baseline hemoglobin 10-12, who is admitted to [facility] on 11/28/2020 with suspected acute upper gastrointestinal bleed after presenting from home to [facility] ED complaining of dizziness.    The patient reports 1 day of dizziness in the absence of any syncope or vertigo.  She notes that this is been associated by 3 days of dark-colored stool, which is new for her.  [name] reports 1 episode of nausea resulting in an episode of nonbloody, nonbilious emesis over the last time.  She also reports no associated coffee-ground appearance with this episode of emesis.  Denies any associated hematochezia.  Denies any recent abdominal pain, loose stool, or any preceding trauma.  She notes that her dizziness has not been associate with any shortness of breath, chest pain, palpitations, diaphoresis.  No recent trauma or any recent subjective fever, chills, rigors, or generalized myalgias.  Denies any recent rash, dysuria, gross materia, or change in urinary urgency/frequency.  Denies any recent cough or hemoptysis.  No recent worsening of edema in the bilateral lower extremities.   The patient reports that she had a suspected gastrointestinal bleed 2021, for which she underwent EGD in April 2021 as well as colonoscopy in June 2021.  Denies any known issues with gastrointestinal bleeding in the interval leading up to the last few days.  Denies any regular or recent consumption of alcohol, denies any known history of underlying chronic liver disease.  In the setting of paroxysmal atrial fibrillation, she is chronically  anticoagulated on Eliquis, with most recent such dose occurring on 11/28/2020.  Otherwise, she denies any use of blood thinners as an outpatient, including no aspirin. Also denies taking any of the following oral medications at home: systemic corticosteroids, supplemental potassium, supplemental iron, or bisphosphonate medications.  The setting of prior gastrointestinal bleed, it appears that the patient is on daily Dexilant.    Per chart review, it appears that patient's baseline hemoglobin is 10-12, with most recent prior hemoglobin noted to be 11.8 on 07/20/2020.    She has a history of chronic systolic/diastolic heart failure, with most recent echocardiogram in January 2022 demonstrating LVEF 25 to 30%, mildly dilated left ventricular internal cavity, grade 2 diastolic dysfunction, and no evidence of significant valvular pathology.  Associated medical management includes Entresto and spironolactone.  In the context of her history of paroxysmal atrial fibrillation she is also on sotalol.   Assessment & Plan:   Principal Problem:   Acute upper GI bleed Active Problems:   Hypothyroidism   PAF (paroxysmal atrial fibrillation) (HCC)   Chronic systolic CHF (congestive heart failure) (HCC)   Acute on chronic blood loss anemia   GI bleed  #1 upper GI bleed patient admitted with complaints of melena for 3 days prior to admission to the hospital in the setting of Eliquis for A. fib.  She denied use of NSAIDs or alcohol. Seen by GI EGD -shows no obvious source of bleeding other than duodenal mucosa friable.  Colonoscopy 10/9-blood in the ileum 30 cm from the ileocecal valve.  Entire examined colon is normal. Capsule endoscopy 12/01/2020-positive for blood in the mid and distal small bowel as well  as the proximal colon.  Exact site of bleeding was difficult to localize. She is status post 2 units of blood transfusion and feraheme  during this hospital stay. On admit her hemoglobin was 8.6 which is down  from 11.8 in May 2022. Check hemoglobin in a.m CT angiogram of the abdomen does not show any evidence of acute bleeding  #2 paroxysmal atrial fibrillation- continue  sotalol  Eliquis on hold.  Consulted cardiology regarding Eliquis  #3 hypothyroidism continue Armour Thyroid  #4 anxiety/depression on Wellbutrin continue  Estimated body mass index is 22.98 kg/m as calculated from the following:   Height as of this encounter: 5\' 9"  (1.753 m).   Weight as of this encounter: 70.6 kg.  DVT prophylaxis: SCD Code Status: full Family Communication: dw husband Disposition Plan:  Status is: Inpatient  The patient remains OBS appropriate and will d/c before 2 midnights.  Dispo: The patient is from: Home              Anticipated d/c is to: Home              Patient currently is not medically stable to d/c.   Difficult to place patient Yes   Consultants: gi  Procedures: egd 10/7 Antimicrobials: none  Subjective:  She feels better she has not had any more melena or bowel movements denies nausea vomiting she feels stronger she feels her breathing is better  Objective: Vitals:   12/02/20 2332 12/03/20 0327 12/03/20 0806 12/03/20 1154  BP: (!) 100/50 (!) 97/43 (!) 91/59 (!) 88/55  Pulse: 64 67 82 68  Resp: 19 17 17 17   Temp: 98.2 F (36.8 C) 98.9 F (37.2 C) 99 F (37.2 C) 98.9 F (37.2 C)  TempSrc: Oral Oral Oral Oral  SpO2: 98% 98% 97% 99%  Weight:  70.6 kg    Height:        Intake/Output Summary (Last 24 hours) at 12/03/2020 1422 Last data filed at 12/03/2020 1300 Gross per 24 hour  Intake 480 ml  Output --  Net 480 ml    Filed Weights   12/02/20 0403 12/02/20 0939 12/03/20 0327  Weight: 71.4 kg 71.4 kg 70.6 kg    Examination:  General exam: Appears calm and comfortable  Respiratory system: Clear to auscultation. Respiratory effort normal. Cardiovascular system: S1 & S2 heard, RRR. No JVD, murmurs, rubs, gallops or clicks. No pedal edema. Gastrointestinal  system: Abdomen is nondistended, soft and nontender. No organomegaly or masses felt. Normal bowel sounds heard. Central nervous system: Alert and oriented. No focal neurological deficits. Extremities: Symmetric 5 x 5 power. Skin: No rashes, lesions or ulcers Psychiatry: Judgement and insight appear normal. Mood & affect appropriate.     Data Reviewed: I have personally reviewed following labs and imaging studies  CBC: Recent Labs  Lab 11/28/20 2319 11/29/20 0525 12/01/20 0029 12/01/20 1839 12/02/20 0119 12/02/20 2108 12/03/20 1011  WBC 7.2  --  8.5  --  7.7  --  6.8  NEUTROABS  --   --   --   --   --   --  4.5  HGB 8.6*   < > 7.0* 8.4* 7.7* 7.5* 8.1*  HCT 26.8*   < > 21.2* 25.6* 22.7* 23.3* 24.5*  MCV 93.4  --  92.6  --  89.0  --  92.8  PLT 164  --  155  --  129*  --  151   < > = values in this interval not displayed.  Basic Metabolic Panel: Recent Labs  Lab 11/28/20 2319 11/30/20 0108 12/01/20 0029  NA 134*  --  137  K 4.3  --  3.6  CL 101  --  106  CO2 25  --  24  GLUCOSE 162*  --  110*  BUN 50*  --  34*  CREATININE 0.95  --  0.93  CALCIUM 8.7*  --  8.4*  MG  --  1.9  --     GFR: Estimated Creatinine Clearance: 72.3 mL/min (by C-G formula based on SCr of 0.93 mg/dL). Liver Function Tests: Recent Labs  Lab 11/28/20 2319  AST 14*  ALT 12  ALKPHOS 47  BILITOT 0.3  PROT 5.8*  ALBUMIN 3.5    No results for input(s): LIPASE, AMYLASE in the last 168 hours. No results for input(s): AMMONIA in the last 168 hours. Coagulation Profile: Recent Labs  Lab 11/30/20 0108  INR 1.2    Cardiac Enzymes: No results for input(s): CKTOTAL, CKMB, CKMBINDEX, TROPONINI in the last 168 hours. BNP (last 3 results) No results for input(s): PROBNP in the last 8760 hours. HbA1C: No results for input(s): HGBA1C in the last 72 hours. CBG: No results for input(s): GLUCAP in the last 168 hours. Lipid Profile: No results for input(s): CHOL, HDL, LDLCALC, TRIG, CHOLHDL,  LDLDIRECT in the last 72 hours. Thyroid Function Tests: No results for input(s): TSH, T4TOTAL, FREET4, T3FREE, THYROIDAB in the last 72 hours. Anemia Panel: No results for input(s): VITAMINB12, FOLATE, FERRITIN, TIBC, IRON, RETICCTPCT in the last 72 hours.  Sepsis Labs: No results for input(s): PROCALCITON, LATICACIDVEN in the last 168 hours.  Recent Results (from the past 240 hour(s))  Resp Panel by RT-PCR (Flu A&B, Covid) Nasopharyngeal Swab     Status: None   Collection Time: 11/29/20  5:26 AM   Specimen: Nasopharyngeal Swab; Nasopharyngeal(NP) swabs in vial transport medium  Result Value Ref Range Status   SARS Coronavirus 2 by RT PCR NEGATIVE NEGATIVE Final    Comment: (NOTE) SARS-CoV-2 target nucleic acids are NOT DETECTED.  The SARS-CoV-2 RNA is generally detectable in upper respiratory specimens during the acute phase of infection. The lowest concentration of SARS-CoV-2 viral copies this assay can detect is 138 copies/mL. A negative result does not preclude SARS-Cov-2 infection and should not be used as the sole basis for treatment or other patient management decisions. A negative result may occur with  improper specimen collection/handling, submission of specimen other than nasopharyngeal swab, presence of viral mutation(s) within the areas targeted by this assay, and inadequate number of viral copies(<138 copies/mL). A negative result must be combined with clinical observations, patient history, and epidemiological information. The expected result is Negative.  Fact Sheet for Patients:  BloggerCourse.com  Fact Sheet for Healthcare Providers:  SeriousBroker.it  This test is no t yet approved or cleared by the [place] FDA and  has been authorized for detection and/or diagnosis of SARS-CoV-2 by FDA under an Emergency Use Authorization (EUA). This EUA will remain  in effect (meaning this test can be used) for the  duration of the COVID-19 declaration under Section 564(b)(1) of the Act, 21 U.S.C.section 360bbb-3(b)(1), unless the authorization is terminated  or revoked sooner.       Influenza A by PCR NEGATIVE NEGATIVE Final   Influenza B by PCR NEGATIVE NEGATIVE Final    Comment: (NOTE) The Xpert Xpress SARS-CoV-2/FLU/RSV plus assay is intended as an aid in the diagnosis of influenza from Nasopharyngeal swab specimens and should not be used as  a sole basis for treatment. Nasal washings and aspirates are unacceptable for Xpert Xpress SARS-CoV-2/FLU/RSV testing.  Fact Sheet for Patients: BloggerCourse.com  Fact Sheet for Healthcare Providers: SeriousBroker.it  This test is not yet approved or cleared by the [place] FDA and has been authorized for detection and/or diagnosis of SARS-CoV-2 by FDA under an Emergency Use Authorization (EUA). This EUA will remain in effect (meaning this test can be used) for the duration of the COVID-19 declaration under Section 564(b)(1) of the Act, 21 U.S.C. section 360bbb-3(b)(1), unless the authorization is terminated or revoked.  Performed at [facility], [address], [place], [place] [address]           Radiology Studies: CT Angio Abd/Pel w/ and/or w/o  Result Date: 12/03/2020 CLINICAL DATA:  Abdominal pain, GI bleed EXAM: CTA ABDOMEN AND PELVIS WITHOUT AND WITH CONTRAST TECHNIQUE: Multidetector CT imaging of the abdomen and pelvis was performed using the standard protocol during bolus administration of intravenous contrast. Multiplanar reconstructed images and MIPs were obtained and reviewed to evaluate the vascular anatomy. CONTRAST:  43mL OMNIPAQUE IOHEXOL 350 MG/ML SOLN COMPARISON:  None. FINDINGS: VASCULAR Aorta: Normal caliber aorta without aneurysm, dissection, vasculitis or significant stenosis. Celiac: Patent without evidence of aneurysm, dissection, vasculitis or significant  stenosis. The common hepatic artery arises directly from the aorta, an anatomic variant. SMA: Patent without evidence of aneurysm, dissection, vasculitis or significant stenosis. Renals: Both renal arteries are patent without evidence of aneurysm, dissection, vasculitis, fibromuscular dysplasia or significant stenosis. IMA: Patent without evidence of aneurysm, dissection, vasculitis or significant stenosis. Inflow: Patent without evidence of aneurysm, dissection, vasculitis or significant stenosis. Proximal Outflow: Bilateral common femoral and visualized portions of the superficial and profunda femoral arteries are patent without evidence of aneurysm, dissection, vasculitis or significant stenosis. Veins: Iliac venous system and IVC patent. Renal veins patent. There is mild narrowing of the left renal vein centrally. Patent hepatic veins portal vein, SM V, splenic vein. Review of the MIP images confirms the above findings. NON-VASCULAR Lower chest: Transvenous pacing lead extends towards the RV apex. No pleural or pericardial effusion. Hepatobiliary: No focal liver abnormality is seen. No gallstones, gallbladder wall thickening, or biliary dilatation. Pancreas: Unremarkable. No pancreatic ductal dilatation or surrounding inflammatory changes. Spleen: Normal in size without focal abnormality. Adrenals/Urinary Tract: Adrenal glands unremarkable. 1 cm probable cyst in the mid left kidney. No urolithiasis or hydronephrosis. Urinary bladder physiologically distended. Stomach/Bowel: No evidence of acute/active hemorrhage into the bowel. The stomach is physiologically distended, unremarkable. The small bowel is decompressed. Nondilated appendix containing small appendicoliths without adjacent inflammatory change. The colon is nondilated, unremarkable. Lymphatic: No abdominal or pelvic adenopathy. Reproductive: Uterus and bilateral adnexa are unremarkable. Other: Bilateral pelvic phleboliths.  No ascites.  No free air.  Musculoskeletal: No acute or significant osseous findings. IMPRESSION: No evidence of active GI bleed or other acute finding. Electronically Signed   By: Corlis Leak M.D.   On: 12/03/2020 13:30        Scheduled Meds:  sodium chloride   Intravenous Once   buPROPion  300 mg Oral Daily   loratadine  10 mg Oral Daily   pantoprazole  40 mg Intravenous Q12H   rosuvastatin  10 mg Oral Daily   sacubitril-valsartan  1 tablet Oral BID   sotalol  160 mg Oral Q24H   And   sotalol  80 mg Oral Q24H   spironolactone  25 mg Oral Daily   thyroid  120 mg Oral QAC breakfast  Continuous Infusions:  lactated ringers 10 mL/hr at 12/02/20 0943     LOS: 2 days    Time spent: 13 MIN  [name], MD 12/03/2020, 2:22 PM

## 2020-12-03 NOTE — Telephone Encounter (Signed)
Opened in error

## 2020-12-04 ENCOUNTER — Encounter (HOSPITAL_COMMUNITY): Payer: Self-pay | Admitting: Gastroenterology

## 2020-12-04 ENCOUNTER — Other Ambulatory Visit: Payer: Self-pay | Admitting: Internal Medicine

## 2020-12-04 DIAGNOSIS — K921 Melena: Secondary | ICD-10-CM

## 2020-12-04 DIAGNOSIS — D649 Anemia, unspecified: Secondary | ICD-10-CM

## 2020-12-04 LAB — COMPREHENSIVE METABOLIC PANEL
ALT: 10 U/L (ref 0–44)
AST: 15 U/L (ref 15–41)
Albumin: 2.8 g/dL — ABNORMAL LOW (ref 3.5–5.0)
Alkaline Phosphatase: 35 U/L — ABNORMAL LOW (ref 38–126)
Anion gap: 7 (ref 5–15)
BUN: 12 mg/dL (ref 6–20)
CO2: 26 mmol/L (ref 22–32)
Calcium: 8.2 mg/dL — ABNORMAL LOW (ref 8.9–10.3)
Chloride: 106 mmol/L (ref 98–111)
Creatinine, Ser: 1.02 mg/dL — ABNORMAL HIGH (ref 0.44–1.00)
GFR, Estimated: >60 mL/min (ref >60)
Glucose, Bld: 96 mg/dL (ref 70–99)
Potassium: 3.9 mmol/L (ref 3.5–5.1)
Sodium: 139 mmol/L (ref 135–145)
Total Bilirubin: 0.4 mg/dL (ref 0.3–1.2)
Total Protein: 4.8 g/dL — ABNORMAL LOW (ref 6.5–8.1)

## 2020-12-04 LAB — CBC WITH DIFFERENTIAL/PLATELET
Abs Immature Granulocytes: 0.03 10*3/uL (ref 0.00–0.07)
Basophils Absolute: 0 10*3/uL (ref 0.0–0.1)
Basophils Relative: 1 %
Eosinophils Absolute: 0.1 10*3/uL (ref 0.0–0.5)
Eosinophils Relative: 2 %
HCT: 21.7 % — ABNORMAL LOW (ref 36.0–46.0)
Hemoglobin: 7.1 g/dL — ABNORMAL LOW (ref 12.0–15.0)
Immature Granulocytes: 1 %
Lymphocytes Relative: 33 %
Lymphs Abs: 1.8 10*3/uL (ref 0.7–4.0)
MCH: 30.6 pg (ref 26.0–34.0)
MCHC: 32.7 g/dL (ref 30.0–36.0)
MCV: 93.5 fL (ref 80.0–100.0)
Monocytes Absolute: 0.5 10*3/uL (ref 0.1–1.0)
Monocytes Relative: 9 %
Neutro Abs: 3.1 10*3/uL (ref 1.7–7.7)
Neutrophils Relative %: 54 %
Platelets: 138 10*3/uL — ABNORMAL LOW (ref 150–400)
RBC: 2.32 MIL/uL — ABNORMAL LOW (ref 3.87–5.11)
RDW: 16.6 % — ABNORMAL HIGH (ref 11.5–15.5)
WBC: 5.6 10*3/uL (ref 4.0–10.5)
nRBC: 0 % (ref 0.0–0.2)

## 2020-12-04 LAB — METHYLMALONIC ACID, SERUM: Methylmalonic Acid, Quantitative: 242 nmol/L (ref 0–378)

## 2020-12-04 LAB — PREPARE RBC (CROSSMATCH)

## 2020-12-04 MED ORDER — TRAZODONE HCL 50 MG PO TABS: 25.0000 mg | ORAL_TABLET | Freq: Every evening | ORAL | 2 refills | Status: DC | PRN

## 2020-12-04 MED ORDER — SPIRONOLACTONE 25 MG PO TABS: 12.5000 mg | ORAL_TABLET | Freq: Every day | ORAL | 2 refills | Status: DC

## 2020-12-04 MED ORDER — SODIUM CHLORIDE 0.9% IV SOLUTION: Freq: Once | INTRAVENOUS | Status: DC

## 2020-12-04 NOTE — Discharge Summary (Signed)
Physician Discharge Summary  Catherine Mays WGY:[id number] DOB: December 18, 1965 DOA: 11/28/2020  PCP: Benita Stabile, MD  Admit date: 11/28/2020 Discharge date: 12/04/2020  Admitted From: home Disposition: home  Recommendations for Outpatient Follow-up:  Follow up with PCP in 1-2 weeks Please obtain BMP/CBC in one week Follow-up with cardiology on 12/14/2020   Home Health: None Equipment/Devices: None Discharge Condition: Stable CODE STATUS: Full code Diet recommendation: Cardiac Brief/Interim Summary: Catherine Mays is a 55 y.o. 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    The patient reports 1 day of dizziness in the absence of any syncope or vertigo.  She notes that this is been associated by 3 days of dark-colored stool, which is new for her.  She reports 1 episode of nausea resulting in an episode of nonbloody, nonbilious emesis over the last time.  She also reports no associated coffee-ground appearance with this episode of emesis.  Denies any associated hematochezia.  Denies any recent abdominal pain, loose stool, or any preceding trauma.  She notes that her dizziness has not been associate with any shortness of breath, chest pain, palpitations, diaphoresis.  No recent trauma or any recent subjective fever, chills, rigors, or generalized myalgias.  Denies any recent rash, dysuria, gross materia, or change in urinary urgency/frequency.  Denies any recent cough or hemoptysis.  No recent worsening of edema in the bilateral lower extremities.   The patient reports that she had a suspected gastrointestinal bleed 2021, for which she  underwent EGD in April 2021 as well as colonoscopy in June 2021.  Denies any known issues with gastrointestinal bleeding in the interval leading up to the last few days.  Denies any regular or recent consumption of alcohol, denies any known history of underlying chronic liver disease.  In the setting of paroxysmal atrial fibrillation, she is chronically anticoagulated on Eliquis, with most recent such dose occurring on 11/28/2020.  Otherwise, she denies any use of blood thinners as an outpatient, including no aspirin. Also denies taking any of the following oral medications at home: systemic corticosteroids, supplemental potassium, supplemental iron, or bisphosphonate medications.  The setting of prior gastrointestinal bleed, it appears that the patient is on daily Dexilant.    Per chart review, it appears that patient's baseline hemoglobin is 10-12, with most recent prior hemoglobin noted to be 11.8 on 07/20/2020.    She has a history of chronic systolic/diastolic heart failure, with most recent echocardiogram in January 2022 demonstrating LVEF 25 to 30%, mildly dilated left ventricular internal cavity, grade 2 diastolic dysfunction, and no evidence of significant valvular pathology.  Associated medical management includes Entresto and spironolactone.  In the context of her history of paroxysmal atrial fibrillation she is also on sotalol.     Discharge Diagnoses:  Principal Problem:   Acute upper GI bleed Active Problems:   Hypothyroidism   PAF (paroxysmal atrial fibrillation) (HCC)   Chronic systolic CHF (congestive heart failure) (HCC)   Acute on chronic blood loss anemia   GI bleed  #1 GI bleed patient admitted with complaints of melena for 3 days prior to admission to the hospital in the setting of Eliquis for A. fib.  She denied use of NSAIDs or alcohol. Seen by  GI EGD -shows no obvious source of bleeding other than duodenal mucosa friable.  Colonoscopy 10/9-blood in the ileum 30 cm from the  ileocecal valve.  Entire examined colon is normal. Capsule endoscopy 12/01/2020-positive for blood in the mid and distal small bowel as well as the proximal colon.  Exact site of bleeding was difficult to localize. She is status post 3 units of blood transfusion and feraheme  during this hospital stay. On admit her hemoglobin was 8.6 which is down from 11.8 in May 2022. CT angiogram of the abdomen does not show any evidence of acute bleeding She is asked to discontinue Eliquis until followed by cardiology on 12/14/2020.   #2 paroxysmal atrial fibrillation- continue  sotalol.  Patient was intolerant to amiodarone due to GI side effects.  Cardiology considering watchman device. She has appointment on 12/05/2020 with a cardiology for carotid ultrasound and echo for Bartostim research    #3 hypothyroidism continue Armour Thyroid   #4 anxiety/depression on Wellbutrin continue  #5 chronic systolic heart failure ejection fraction 30 to 35%.  Entresto and Lasix have been stopped she has been started on Aldactone.  This was stopped due to very soft blood pressure.  Continue sotalol.    Estimated body mass index is 22.98 kg/m as calculated from the following:   Height as of this encounter: 5\' 9"  (1.753 m).   Weight as of this encounter: 70.6 kg.  Discharge Instructions  Discharge Instructions     Avoid straining   Complete by: As directed    Diet - low sodium heart healthy   Complete by: As directed    Increase activity slowly   Complete by: As directed    STOP any activity that causes chest pain, shortness of breath, dizziness, sweating, or exessive weakness   Complete by: As directed       Allergies as of 12/04/2020       Reactions   Paxil [paroxetine] Palpitations   Morphine And Related Nausea And Vomiting   Percocet [oxycodone-acetaminophen] Nausea And Vomiting   Cefdinir Nausea Only   Zolpidem Other (See Comments)   Doesn't work for patient at all        Medication List      STOP taking these medications    apixaban 5 MG Tabs tablet Commonly known as: ELIQUIS   dexlansoprazole 60 MG capsule Commonly known as: DEXILANT   Entresto 24-26 MG Generic drug: sacubitril-valsartan   furosemide 20 MG tablet Commonly known as: LASIX   ibuprofen 200 MG tablet Commonly known as: ADVIL   losartan 25 MG tablet Commonly known as: COZAAR       TAKE these medications    acetaminophen 500 MG tablet Commonly known as: TYLENOL Take 1,000 mg by mouth every 6 (six) hours as needed for mild pain.   buPROPion 300 MG 24 hr tablet Commonly known as: WELLBUTRIN XL Take 300 mg by mouth daily.   CALCIUM + D3 PO Take 1 tablet by mouth every morning.   CoQ10 200 MG Caps Take 200 mg by mouth daily.   fexofenadine 180 MG tablet Commonly known as: ALLEGRA Take 180 mg by mouth daily.   nitroGLYCERIN 0.4 MG SL tablet Commonly known as: NITROSTAT PLACE 1 TABLET UNDER THE TONGUE EVERY 5 MINUTES AS NEEDED FOR CHEST PAIN What changed: See the new instructions.   rosuvastatin 10 MG tablet Commonly known as: CRESTOR TAKE 1 TABLET(10 MG) BY MOUTH DAILY What changed: See the new instructions.   sotalol 160 MG tablet Commonly  known as: BETAPACE TAKE 1 TABLET BY MOUTH EVERY MORNING AND 1/2 TABLET EVERY EVENING What changed: See the new instructions.   spironolactone 25 MG tablet Commonly known as: ALDACTONE Take 0.5 tablets (12.5 mg total) by mouth daily. START 10/12 What changed:  how much to take additional instructions   thyroid 120 MG tablet Commonly known as: ARMOUR Take 120 mg by mouth daily before breakfast.   traZODone 50 MG tablet Commonly known as: DESYREL Take 0.5 tablets (25 mg total) by mouth at bedtime as needed for sleep.   triamcinolone 55 MCG/ACT Aero nasal inhaler Commonly known as: NASACORT Place 2 sprays into the nose daily.        Follow-up Information     [facility] Follow up.    Specialty: Cardiology Why: [facility]  10/21 at 3:00 PM Parking Garage Code 3333 Contact information: [address] [id number] [address] [id number] [id number] [phone]        Benita Stabile, MD Follow up.   Specialty: Internal Medicine Contact information: [address] [place] [facility] [address] [phone]         Regan Lemming, MD .   Specialty: Cardiology Contact information: [address] [place] [address] [place] [address] [phone]         Kathleene Hazel, MD .   Specialty: Cardiology Contact information: [address] [place] [address] [phone]                Allergies  Allergen Reactions   Paxil [Paroxetine] Palpitations   Morphine And Related Nausea And Vomiting   Percocet [Oxycodone-Acetaminophen] Nausea And Vomiting   Cefdinir Nausea Only   Zolpidem Other (See Comments)    Doesn't work for patient at all    Consultations: Cardiology   Procedures/Studies: CT Angio Abd/Pel w/ and/or w/o  Result Date: 12/03/2020 CLINICAL DATA:  Abdominal pain, GI bleed EXAM: CTA ABDOMEN AND PELVIS WITHOUT AND WITH CONTRAST TECHNIQUE: Multidetector CT imaging of the abdomen and pelvis was performed using the standard protocol during bolus administration of intravenous contrast. Multiplanar reconstructed images and MIPs were obtained and reviewed to evaluate the vascular anatomy. CONTRAST:  75mL OMNIPAQUE IOHEXOL 350 MG/ML SOLN COMPARISON:  None. FINDINGS: VASCULAR Aorta: Normal caliber aorta without aneurysm, dissection, vasculitis or significant stenosis. Celiac: Patent without evidence of aneurysm, dissection, vasculitis or significant stenosis. The common hepatic artery arises directly from the aorta, an anatomic variant. SMA: Patent without evidence of aneurysm, dissection, vasculitis or significant stenosis. Renals: Both renal arteries are patent without evidence of aneurysm,  dissection, vasculitis, fibromuscular dysplasia or significant stenosis. IMA: Patent without evidence of aneurysm, dissection, vasculitis or significant stenosis. Inflow: Patent without evidence of aneurysm, dissection, vasculitis or significant stenosis. Proximal Outflow: Bilateral common femoral and visualized portions of the superficial and profunda femoral arteries are patent without evidence of aneurysm, dissection, vasculitis or significant stenosis. Veins: Iliac venous system and IVC patent. Renal veins patent. There is mild narrowing of the left renal vein centrally. Patent hepatic veins portal vein, SM V, splenic vein. Review of the MIP images confirms the above findings. NON-VASCULAR Lower chest: Transvenous pacing lead extends towards the RV apex. No pleural or pericardial effusion. Hepatobiliary: No focal liver abnormality is seen. No gallstones, gallbladder wall thickening, or biliary dilatation. Pancreas: Unremarkable. No pancreatic ductal dilatation or surrounding inflammatory changes. Spleen: Normal in size without focal abnormality. Adrenals/Urinary Tract: Adrenal glands unremarkable. 1 cm probable cyst  in the mid left kidney. No urolithiasis or hydronephrosis. Urinary bladder physiologically distended. Stomach/Bowel: No evidence of acute/active hemorrhage into the bowel. The stomach is physiologically distended, unremarkable. The small bowel is decompressed. Nondilated appendix containing small appendicoliths without adjacent inflammatory change. The colon is nondilated, unremarkable. Lymphatic: No abdominal or pelvic adenopathy. Reproductive: Uterus and bilateral adnexa are unremarkable. Other: Bilateral pelvic phleboliths.  No ascites.  No free air. Musculoskeletal: No acute or significant osseous findings. IMPRESSION: No evidence of active GI bleed or other acute finding. Electronically Signed   By: Corlis Leak M.D.   On: 12/03/2020 13:30   (Echo, Carotid, EGD, Colonoscopy, ERCP)     Subjective:  Patient resting in bed anxious to go home she reports her stools were brown today denies any nausea vomiting or shortness of breath or chest pain her hemoglobin was 7.1 today she got 1 unit of packed RBC today. Discharge Exam: Vitals:   12/04/20 1552 12/04/20 1553  BP: (!) 89/59 (!) 89/59  Pulse: 67 70  Resp: 16 15  Temp:  98.7 F (37.1 C)  SpO2: 97% 98%   Vitals:   12/04/20 1200 12/04/20 1220 12/04/20 1552 12/04/20 1553  BP: (!) 79/58 93/78 (!) 89/59 (!) 89/59  Pulse:   67 70  Resp: 15 16 16 15   Temp: 98.3 F (36.8 C) 98.1 F (36.7 C)  98.7 F (37.1 C)  TempSrc: Oral Oral  Oral  SpO2: 99% 100% 97% 98%  Weight:      Height:        General: Pt is alert, awake, not in acute distress Cardiovascular: RRR, S1/S2 +, no rubs, no gallops Respiratory: CTA bilaterally, no wheezing, no rhonchi Abdominal: Soft, NT, ND, bowel sounds + Extremities: no edema, no cyanosis    The results of significant diagnostics from this hospitalization (including imaging, microbiology, ancillary and laboratory) are listed below for reference.     Microbiology: Recent Results (from the past 240 hour(s))  Resp Panel by RT-PCR (Flu A&B, Covid) Nasopharyngeal Swab     Status: None   Collection Time: 11/29/20  5:26 AM   Specimen: Nasopharyngeal Swab; Nasopharyngeal(NP) swabs in vial transport medium  Result Value Ref Range Status   SARS Coronavirus 2 by RT PCR NEGATIVE NEGATIVE Final    Comment: (NOTE) SARS-CoV-2 target nucleic acids are NOT DETECTED.  The SARS-CoV-2 RNA is generally detectable in upper respiratory specimens during the acute phase of infection. The lowest concentration of SARS-CoV-2 viral copies this assay can detect is 138 copies/mL. A negative result does not preclude SARS-Cov-2 infection and should not be used as the sole basis for treatment or other patient management decisions. A negative result may occur with  improper specimen collection/handling,  submission of specimen other than nasopharyngeal swab, presence of viral mutation(s) within the areas targeted by this assay, and inadequate number of viral copies(<138 copies/mL). A negative result must be combined with clinical observations, patient history, and epidemiological information. The expected result is Negative.  Fact Sheet for Patients:  01/29/21  Fact Sheet for Healthcare Providers:  BloggerCourse.com  This test is no t yet approved or cleared by the [place] FDA and  has been authorized for detection and/or diagnosis of SARS-CoV-2 by FDA under an Emergency Use Authorization (EUA). This EUA will remain  in effect (meaning this test can be used) for the duration of the COVID-19 declaration under Section 564(b)(1) of the Act, 21 U.S.C.section 360bbb-3(b)(1), unless the authorization is terminated  or revoked sooner.  Influenza A by PCR NEGATIVE NEGATIVE Final   Influenza B by PCR NEGATIVE NEGATIVE Final    Comment: (NOTE) The Xpert Xpress SARS-CoV-2/FLU/RSV plus assay is intended as an aid in the diagnosis of influenza from Nasopharyngeal swab specimens and should not be used as a sole basis for treatment. Nasal washings and aspirates are unacceptable for Xpert Xpress SARS-CoV-2/FLU/RSV testing.  Fact Sheet for Patients: BloggerCourse.com  Fact Sheet for Healthcare Providers: SeriousBroker.it  This test is not yet approved or cleared by the [place] FDA and has been authorized for detection and/or diagnosis of SARS-CoV-2 by FDA under an Emergency Use Authorization (EUA). This EUA will remain in effect (meaning this test can be used) for the duration of the COVID-19 declaration under Section 564(b)(1) of the Act, 21 U.S.C. section 360bbb-3(b)(1), unless the authorization is terminated or revoked.  Performed at [facility], [address], [place], [place] [address]      Labs: BNP (last 3 results) Recent Labs    06/19/20 1043 10/19/20 1151  BNP 278.5* 219.9*   Basic Metabolic Panel: Recent Labs  Lab 11/28/20 2319 11/30/20 0108 12/01/20 0029 12/04/20 0705  NA 134*  --  137 139  K 4.3  --  3.6 3.9  CL 101  --  106 106  CO2 25  --  24 26  GLUCOSE 162*  --  110* 96  BUN 50*  --  34* 12  CREATININE 0.95  --  0.93 1.02*  CALCIUM 8.7*  --  8.4* 8.2*  MG  --  1.9  --   --    Liver Function Tests: Recent Labs  Lab 11/28/20 2319 12/04/20 0705  AST 14* 15  ALT 12 10  ALKPHOS 47 35*  BILITOT 0.3 0.4  PROT 5.8* 4.8*  ALBUMIN 3.5 2.8*   No results for input(s): LIPASE, AMYLASE in the last 168 hours. No results for input(s): AMMONIA in the last 168 hours. CBC: Recent Labs  Lab 11/28/20 2319 11/29/20 0525 12/01/20 0029 12/01/20 1839 12/02/20 0119 12/02/20 2108 12/03/20 1011 12/04/20 0705  WBC 7.2  --  8.5  --  7.7  --  6.8 5.6  NEUTROABS  --   --   --   --   --   --  4.5 3.1  HGB 8.6*   < > 7.0* 8.4* 7.7* 7.5* 8.1* 7.1*  HCT 26.8*   < > 21.2* 25.6* 22.7* 23.3* 24.5* 21.7*  MCV 93.4  --  92.6  --  89.0  --  92.8 93.5  PLT 164  --  155  --  129*  --  151 138*   < > = values in this interval not displayed.   Cardiac Enzymes: No results for input(s): CKTOTAL, CKMB, CKMBINDEX, TROPONINI in the last 168 hours. BNP: Invalid input(s): POCBNP CBG: No results for input(s): GLUCAP in the last 168 hours. D-Dimer No results for input(s): DDIMER in the last 72 hours. Hgb A1c No results for input(s): HGBA1C in the last 72 hours. Lipid Profile No results for input(s): CHOL, HDL, LDLCALC, TRIG, CHOLHDL, LDLDIRECT in the last 72 hours. Thyroid function studies No results for input(s): TSH, T4TOTAL, T3FREE, THYROIDAB in the last 72 hours.  Invalid input(s): FREET3 Anemia work up No results for input(s): VITAMINB12, FOLATE, FERRITIN, TIBC, IRON, RETICCTPCT in the last 72 hours. Urinalysis No results  found for: COLORURINE, APPEARANCEUR, LABSPEC, PHURINE, GLUCOSEU, HGBUR, BILIRUBINUR, KETONESUR, PROTEINUR, UROBILINOGEN, NITRITE, LEUKOCYTESUR Sepsis Labs Invalid input(s): PROCALCITONIN,  WBC,  LACTICIDVEN  Microbiology Recent Results (from the past 240 hour(s))  Resp Panel by RT-PCR (Flu A&B, Covid) Nasopharyngeal Swab     Status: None   Collection Time: 11/29/20  5:26 AM   Specimen: Nasopharyngeal Swab; Nasopharyngeal(NP) swabs in vial transport medium  Result Value Ref Range Status   SARS Coronavirus 2 by RT PCR NEGATIVE NEGATIVE Final    Comment: (NOTE) SARS-CoV-2 target nucleic acids are NOT DETECTED.  The SARS-CoV-2 RNA is generally detectable in upper respiratory specimens during the acute phase of infection. The lowest concentration of SARS-CoV-2 viral copies this assay can detect is 138 copies/mL. A negative result does not preclude SARS-Cov-2 infection and should not be used as the sole basis for treatment or other patient management decisions. A negative result may occur with  improper specimen collection/handling, submission of specimen other than nasopharyngeal swab, presence of viral mutation(s) within the areas targeted by this assay, and inadequate number of viral copies(<138 copies/mL). A negative result must be combined with clinical observations, patient history, and epidemiological information. The expected result is Negative.  Fact Sheet for Patients:  BloggerCourse.com  Fact Sheet for Healthcare Providers:  [employer]iousBroker.it  This test is no t yet approved or cleared by the [place] FDA and  has been authorized for detection and/or diagnosis of SARS-CoV-2 by FDA under an Emergency Use Authorization (EUA). This EUA will remain  in effect (meaning this test can be used) for the duration of the COVID-19 declaration under Section 564(b)(1) of the Act, 21 U.S.C.section 360bbb-3(b)(1), unless the authorization  is terminated  or revoked sooner.       Influenza A by PCR NEGATIVE NEGATIVE Final   Influenza B by PCR NEGATIVE NEGATIVE Final    Comment: (NOTE) The Xpert Xpress SARS-CoV-2/FLU/RSV plus assay is intended as an aid in the diagnosis of influenza from Nasopharyngeal swab specimens and should not be used as a sole basis for treatment. Nasal washings and aspirates are unacceptable for Xpert Xpress SARS-CoV-2/FLU/RSV testing.  Fact Sheet for Patients: BloggerCourse.com  Fact Sheet for Healthcare Providers: SeriousBroker.it  This test is not yet approved or cleared by the [place] FDA and has been authorized for detection and/or diagnosis of SARS-CoV-2 by FDA under an Emergency Use Authorization (EUA). This EUA will remain in effect (meaning this test can be used) for the duration of the COVID-19 declaration under Section 564(b)(1) of the Act, 21 U.S.C. section 360bbb-3(b)(1), unless the authorization is terminated or revoked.  Performed at [facility], [address] [place], [place] [address]      Time coordinating discharge: 39 minutes  SIGNED:   [name], MD  [facility] 12/04/2020, 4:58 PM

## 2020-12-04 NOTE — Progress Notes (Signed)
Discharge instructions reviewed with pt and husband.  Pt aware of scripts sent to her home pharmacy.  Copy of instructions given to pt.  Pt d/c'd via wheelchair with her belongings, with her husband, escorted by unit NT.

## 2020-12-04 NOTE — Progress Notes (Signed)
Daily Rounding Note  12/04/2020, 3:23 PM  LOS: 3 days   SUBJECTIVE:   Denies bleeding. Feeling well.  OBJECTIVE:         Vital signs in last 24 hours:    Temp:  [97.5 F (36.4 C)-98.9 F (37.2 C)] 98.1 F (36.7 C) (10/11 1220) Pulse Rate:  [66-89] 66 (10/11 1135) Resp:  [14-17] 16 (10/11 1220) BP: (79-93)/(53-78) 93/78 (10/11 1220) SpO2:  [97 %-100 %] 100 % (10/11 1220) Last BM Date: 12/03/20 (yesterday pt states (10/10)) Filed Weights   12/02/20 0403 12/02/20 0939 12/03/20 0327  Weight: 71.4 kg 71.4 kg 70.6 kg   General: NAD   Heart: regular rate Chest: no increased WOB, clear anteriorly Abdomen: soft, NT.    Extremities: no CCE Neuro/Psych:  oriented x 3.  Appropriate.    Intake/Output from previous day: 10/10 0701 - 10/11 0700 In: 720 [P.O.:720] Out: -   Intake/Output this shift: Total I/O In: 390 [P.O.:380; I.V.:10] Out: -   Lab Results: Recent Labs    12/02/20 0119 12/02/20 2108 12/03/20 1011 12/04/20 0705  WBC 7.7  --  6.8 5.6  HGB 7.7* 7.5* 8.1* 7.1*  HCT 22.7* 23.3* 24.5* 21.7*  PLT 129*  --  151 138*   BMET Recent Labs    12/04/20 0705  NA 139  K 3.9  CL 106  CO2 26  GLUCOSE 96  BUN 12  CREATININE 1.02*  CALCIUM 8.2*   LFT Recent Labs    12/04/20 0705  PROT 4.8*  ALBUMIN 2.8*  AST 15  ALT 10  ALKPHOS 35*  BILITOT 0.4   PT/INR No results for input(s): LABPROT, INR in the last 72 hours. Hepatitis Panel No results for input(s): HEPBSAG, HCVAB, HEPAIGM, HEPBIGM in the last 72 hours.  Studies/Results: CT Angio Abd/Pel w/ and/or w/o  Result Date: 12/03/2020 CLINICAL DATA:  Abdominal pain, GI bleed EXAM: CTA ABDOMEN AND PELVIS WITHOUT AND WITH CONTRAST TECHNIQUE: Multidetector CT imaging of the abdomen and pelvis was performed using the standard protocol during bolus administration of intravenous contrast. Multiplanar reconstructed images and MIPs were obtained and  reviewed to evaluate the vascular anatomy. CONTRAST:  64mL OMNIPAQUE IOHEXOL 350 MG/ML SOLN COMPARISON:  None. FINDINGS: VASCULAR Aorta: Normal caliber aorta without aneurysm, dissection, vasculitis or significant stenosis. Celiac: Patent without evidence of aneurysm, dissection, vasculitis or significant stenosis. The common hepatic artery arises directly from the aorta, an anatomic variant. SMA: Patent without evidence of aneurysm, dissection, vasculitis or significant stenosis. Renals: Both renal arteries are patent without evidence of aneurysm, dissection, vasculitis, fibromuscular dysplasia or significant stenosis. IMA: Patent without evidence of aneurysm, dissection, vasculitis or significant stenosis. Inflow: Patent without evidence of aneurysm, dissection, vasculitis or significant stenosis. Proximal Outflow: Bilateral common femoral and visualized portions of the superficial and profunda femoral arteries are patent without evidence of aneurysm, dissection, vasculitis or significant stenosis. Veins: Iliac venous system and IVC patent. Renal veins patent. There is mild narrowing of the left renal vein centrally. Patent hepatic veins portal vein, SM V, splenic vein. Review of the MIP images confirms the above findings. NON-VASCULAR Lower chest: Transvenous pacing lead extends towards the RV apex. No pleural or pericardial effusion. Hepatobiliary: No focal liver abnormality is seen. No gallstones, gallbladder wall thickening, or biliary dilatation. Pancreas: Unremarkable. No pancreatic ductal dilatation or surrounding inflammatory changes. Spleen: Normal in size without focal abnormality. Adrenals/Urinary Tract: Adrenal glands unremarkable. 1 cm probable cyst in the mid left kidney. No  urolithiasis or hydronephrosis. Urinary bladder physiologically distended. Stomach/Bowel: No evidence of acute/active hemorrhage into the bowel. The stomach is physiologically distended, unremarkable. The small bowel is  decompressed. Nondilated appendix containing small appendicoliths without adjacent inflammatory change. The colon is nondilated, unremarkable. Lymphatic: No abdominal or pelvic adenopathy. Reproductive: Uterus and bilateral adnexa are unremarkable. Other: Bilateral pelvic phleboliths.  No ascites.  No free air. Musculoskeletal: No acute or significant osseous findings. IMPRESSION: No evidence of active GI bleed or other acute finding. Electronically Signed   By: Corlis Leak M.D.   On: 12/03/2020 13:30    Scheduled Meds:  sodium chloride   Intravenous Once   sodium chloride   Intravenous Once   buPROPion  300 mg Oral Daily   loratadine  10 mg Oral Daily   pantoprazole  40 mg Intravenous Q12H   rosuvastatin  10 mg Oral Daily   sotalol  160 mg Oral Q24H   And   sotalol  80 mg Oral Q24H   thyroid  120 mg Oral QAC breakfast   Continuous Infusions:  lactated ringers 10 mL/hr at 12/02/20 0943   PRN Meds:.acetaminophen **OR** acetaminophen, ondansetron (ZOFRAN) IV, traZODone  ASSESMENT:   55 year old female with history of CAD s/p PCI, HFrEF (EF 25-30%) s/p AICD, PAF on Eliquis p/w IDA and melena. EGD 10/7 w/o a source of anemia. VCE 10/8 showed blood in the mid/distal SB and proximal colon. Details of VCE are 1st gastric image 00:00:41, 1st duodenal image 01:21:02, darker blood starts 3:30:27, blood clot 3:58:05, first cecum image 4:17:56. Colonoscopy 10/9 showed blood in the terminal ileum but no clear source despite advancing the colonoscope about 30 cm into the terminal ileum. It is suspected that the source of bleeding is in the distal small bowel that is not able to be reached via a routine colonoscope. CTA A/P 10/10 was performed that did not reveal a source of bleeding. The patient has not had any melena for days and Hb has remained relatively stable/slight downtrend. Discussed this case with IR and Dr. Theodore Demark with the [facility] small bowel team. Offered the patient the option of staying in the hospital  to get tagged RBC scan vs. waiting for transfer to [place] for DBE vs. outpatient DBE. Patient preferred outpatient DBE. Would plan to hold her Eliquis until that outpatient procedure. I placed referral to [facility] for double balloon enteroscopy and contacted Dr. Theodore Demark who will assist with expediting this referral. Patient should continue to get her blood counts checked as an outpatient with her PCP to ensure that she is not dropping too low.  PLAN   Outpatient DBE at [facility] Continue to trend blood counts as an outpatient   [name]  12/04/2020, 3:23 PM Phone [phone]

## 2020-12-04 NOTE — TOC Initial Note (Signed)
Transition of Care [facility]) - Initial/Assessment Note    Patient Details  Name: Catherine Mays MRN: [id number] Date of Birth: September 07, 1965  [facility]) CM/SW Contact:    Lockie Pares, RN Phone Number: 12/04/2020, 9:32 AM  Clinical Narrative:                  55 YO female on eliquis has history of HF EF 25% in with GI bleed. EGD done, bleeding scan, consult to IR for possible embolization. Required several units of PC. Patient employed at [employer] ( from [place])   NO needs identified at this time, but CM will follow for recommendations and transitions.  Expected Discharge Plan: Home/Self Care Barriers to Discharge: Continued Medical Work up   Patient Goals and CMS Choice        Expected Discharge Plan and Services Expected Discharge Plan: Home/Self Care       Living arrangements for the past 2 months: Single Family Home                                      Prior Living Arrangements/Services Living arrangements for the past 2 months: Single Family Home Lives with:: Spouse Patient language and need for interpreter reviewed:: Yes        Need for Family Participation in Patient Care: Yes (Comment) Care giver support system in place?: Yes (comment)   Criminal Activity/Legal Involvement Pertinent to Current Situation/Hospitalization: No - Comment as needed  Activities of Daily Living Home Assistive Devices/Equipment: Eyeglasses ADL Screening (condition at time of admission) Patient's cognitive ability adequate to safely complete daily activities?: Yes Is the patient deaf or have difficulty hearing?: No Does the patient have difficulty seeing, even when wearing glasses/contacts?: No Does the patient have difficulty concentrating, remembering, or making decisions?: No Patient able to express need for assistance with ADLs?: No Does the patient have difficulty dressing or bathing?: No Independently performs ADLs?: Yes (appropriate for developmental  age) Does the patient have difficulty walking or climbing stairs?: Yes (SOB with stairs) Weakness of Legs: None Weakness of Arms/Hands: None  Permission Sought/Granted                  Emotional Assessment       Orientation: : Oriented to Self, Oriented to Place, Oriented to  Time, Oriented to Situation Alcohol / Substance Use: Not Applicable Psych Involvement: No (comment)  Admission diagnosis:  GI bleed [K92.2] Acute upper GI bleed [K92.2] Gastrointestinal hemorrhage, unspecified gastrointestinal hemorrhage type [K92.2] Patient Active Problem List   Diagnosis Date Noted   Acute upper GI bleed 11/29/2020   Acute on chronic blood loss anemia 11/29/2020   GI bleed 11/29/2020   IDA (iron deficiency anemia) 05/23/2019   GERD (gastroesophageal reflux disease) 05/23/2019   Nausea without vomiting 05/23/2019   Weakness 12/18/2017   Sensation of chest pressure 12/17/2017   Encounter for screening colonoscopy 10/19/2017   PVC (premature ventricular contraction) 04/02/2017   Mitral regurgitation 04/17/2016   CHF (congestive heart failure) (HCC) 01/23/2016   PVC's (premature ventricular contractions)    Pre-diabetes    Chronic systolic CHF (congestive heart failure) (HCC)    Ischemic cardiomyopathy    Hypotension    CAD in native artery    Hypothyroidism 09/24/2015   PAF (paroxysmal atrial fibrillation) (HCC) 09/24/2015   PCP:  Benita Stabile, MD Pharmacy:   [id number] [place], [place] -  [address] AT [facility] [address] [place] [address] Phone: [phone] Fax: [phone]     Social Determinants of Health (SDOH) Interventions    Readmission Risk Interventions No flowsheet data found.

## 2020-12-04 NOTE — Discharge Instructions (Signed)
Cbc 10/13

## 2020-12-04 NOTE — Progress Notes (Addendum)
Advanced Heart Failure Rounding Note  PCP-Cardiologist: Verne Carrow, MD  [facility]: Dr. Shirlee Latch  EP: Dr. Elberta Fortis   Patient Profile   55 y.o. female a h/o CAD with a late presenting STEMI in 08/2015 s/p DES to LAD, ischemic cardiomyopathy/ chronic combined CHF (EF of 25-30%), s/p ICD, PAF on Eliquis, frequent PVCs s/p ablation and prior h/o GIB/anemia, admitted for recurrent GIB w/ ABLA and hypotension.   Subjective:   Admit Hgb 8.6 (baseline ~11). FOBT +  Transfused 2U RBCs total this admit.  EGD 10/7: unremarkable  VCE 10/8: showed blood in the mid/distal SB and proximal colon Colonoscopy 10/9: showed blood in the terminal ileum but no clear source  Suspected that the source of bleeding is in the distal small bowel. Awaiting IR arteriogram/embolization   Hgb continues to trend down, 7.1 today. Had small BM last night w/ dark stool.   BP remains soft, low 90s systolic. Remains on sotolol. All other HF meds on hold. Denies any current orthostatic symptoms.  6 beat run NSVT on tele. No Afib.   Objective:   Weight Range: 70.6 kg Body mass index is 22.98 kg/m.   Vital Signs:   Temp:  [97.5 F (36.4 C)-98.9 F (37.2 C)] 98.9 F (37.2 C) (10/11 0804) Pulse Rate:  [66-89] 78 (10/11 0804) Resp:  [14-17] 17 (10/11 0804) BP: (81-91)/(53-62) 91/62 (10/11 0804) SpO2:  [97 %-99 %] 98 % (10/11 0804) Last BM Date: 12/01/20  Weight change: Filed Weights   12/02/20 0403 12/02/20 0939 12/03/20 0327  Weight: 71.4 kg 71.4 kg 70.6 kg    Intake/Output:   Intake/Output Summary (Last 24 hours) at 12/04/2020 0810 Last data filed at 12/03/2020 1824 Gross per 24 hour  Intake 720 ml  Output --  Net 720 ml      Physical Exam    General:  Well appearing. No resp difficulty HEENT: Normal Neck: Supple. No JVP . Carotids 2+ bilat; no bruits. No lymphadenopathy or thyromegaly appreciated. Cor: PMI nondisplaced. Regular rate & rhythm. No rubs, gallops or murmurs. Lungs:  Clear Abdomen: Soft, nontender, nondistended. No hepatosplenomegaly. No bruits or masses. Good bowel sounds. Extremities: No cyanosis, clubbing, rash, edema Neuro: Alert & orientedx3, cranial nerves grossly intact. moves all 4 extremities w/o difficulty. Affect pleasant   Telemetry   NSR 80s, 1 6 beat run of NSVT, personally reviewed   EKG    No new EKG to review   Labs    CBC Recent Labs    12/03/20 1011 12/04/20 0705  WBC 6.8 5.6  NEUTROABS 4.5 3.1  HGB 8.1* 7.1*  HCT 24.5* 21.7*  MCV 92.8 93.5  PLT 151 138*   Basic Metabolic Panel Recent Labs    16/10/96 0705  NA 139  K 3.9  CL 106  CO2 26  GLUCOSE 96  BUN 12  CREATININE 1.02*  CALCIUM 8.2*   Liver Function Tests Recent Labs    12/04/20 0705  AST 15  ALT 10  ALKPHOS 35*  BILITOT 0.4  PROT 4.8*  ALBUMIN 2.8*   No results for input(s): LIPASE, AMYLASE in the last 72 hours. Cardiac Enzymes No results for input(s): CKTOTAL, CKMB, CKMBINDEX, TROPONINI in the last 72 hours.  BNP: BNP (last 3 results) Recent Labs    06/19/20 1043 10/19/20 1151  BNP 278.5* 219.9*    ProBNP (last 3 results) No results for input(s): PROBNP in the last 8760 hours.   D-Dimer No results for input(s): DDIMER in the last 72 hours. Hemoglobin  A1C No results for input(s): HGBA1C in the last 72 hours. Fasting Lipid Panel No results for input(s): CHOL, HDL, LDLCALC, TRIG, CHOLHDL, LDLDIRECT in the last 72 hours. Thyroid Function Tests No results for input(s): TSH, T4TOTAL, T3FREE, THYROIDAB in the last 72 hours.  Invalid input(s): FREET3  Other results:   Imaging    CT Angio Abd/Pel w/ and/or w/o  Result Date: 12/03/2020 CLINICAL DATA:  Abdominal pain, GI bleed EXAM: CTA ABDOMEN AND PELVIS WITHOUT AND WITH CONTRAST TECHNIQUE: Multidetector CT imaging of the abdomen and pelvis was performed using the standard protocol during bolus administration of intravenous contrast. Multiplanar reconstructed images and MIPs  were obtained and reviewed to evaluate the vascular anatomy. CONTRAST:  68mL OMNIPAQUE IOHEXOL 350 MG/ML SOLN COMPARISON:  None. FINDINGS: VASCULAR Aorta: Normal caliber aorta without aneurysm, dissection, vasculitis or significant stenosis. Celiac: Patent without evidence of aneurysm, dissection, vasculitis or significant stenosis. The common hepatic artery arises directly from the aorta, an anatomic variant. SMA: Patent without evidence of aneurysm, dissection, vasculitis or significant stenosis. Renals: Both renal arteries are patent without evidence of aneurysm, dissection, vasculitis, fibromuscular dysplasia or significant stenosis. IMA: Patent without evidence of aneurysm, dissection, vasculitis or significant stenosis. Inflow: Patent without evidence of aneurysm, dissection, vasculitis or significant stenosis. Proximal Outflow: Bilateral common femoral and visualized portions of the superficial and profunda femoral arteries are patent without evidence of aneurysm, dissection, vasculitis or significant stenosis. Veins: Iliac venous system and IVC patent. Renal veins patent. There is mild narrowing of the left renal vein centrally. Patent hepatic veins portal vein, SM V, splenic vein. Review of the MIP images confirms the above findings. NON-VASCULAR Lower chest: Transvenous pacing lead extends towards the RV apex. No pleural or pericardial effusion. Hepatobiliary: No focal liver abnormality is seen. No gallstones, gallbladder wall thickening, or biliary dilatation. Pancreas: Unremarkable. No pancreatic ductal dilatation or surrounding inflammatory changes. Spleen: Normal in size without focal abnormality. Adrenals/Urinary Tract: Adrenal glands unremarkable. 1 cm probable cyst in the mid left kidney. No urolithiasis or hydronephrosis. Urinary bladder physiologically distended. Stomach/Bowel: No evidence of acute/active hemorrhage into the bowel. The stomach is physiologically distended, unremarkable. The small  bowel is decompressed. Nondilated appendix containing small appendicoliths without adjacent inflammatory change. The colon is nondilated, unremarkable. Lymphatic: No abdominal or pelvic adenopathy. Reproductive: Uterus and bilateral adnexa are unremarkable. Other: Bilateral pelvic phleboliths.  No ascites.  No free air. Musculoskeletal: No acute or significant osseous findings. IMPRESSION: No evidence of active GI bleed or other acute finding. Electronically Signed   By: Corlis Leak M.D.   On: 12/03/2020 13:30     Medications:     Scheduled Medications:  sodium chloride   Intravenous Once   buPROPion  300 mg Oral Daily   loratadine  10 mg Oral Daily   pantoprazole  40 mg Intravenous Q12H   rosuvastatin  10 mg Oral Daily   sotalol  160 mg Oral Q24H   And   sotalol  80 mg Oral Q24H   thyroid  120 mg Oral QAC breakfast    Infusions:  lactated ringers 10 mL/hr at 12/02/20 0943    PRN Medications: acetaminophen **OR** acetaminophen, ondansetron (ZOFRAN) IV, traZODone   Assessment/Plan   GIB/ABLA  - Admit Hgb 8.6 (baseline ~11). - FOBT+. Eliquis held  - Transfused 2U RBCs - EGD 10/7: unremarkable  - VCE 10/8: showed blood in the mid/distal SB and proximal colon - Colonoscopy 10/9: showed blood in the terminal ileum but no clear source  - Suspected that  the source of bleeding is in the distal small bowel. Awaiting IR arteriogram/embolization - Hgb 7.1 today, transfuse per IM   2. Hypotension  - 2/2 ABLA, SBPs low 90s but no orthostatic symptoms - continue to hold HF/BP active meds - plan to continue sotolol given h/o PVCs/NSVT  - can add midodrine if needed   3. Chronic Systolic Heart Failure - ICM. EF 25% on echo in 10/17, 30-35% on echo in 11/17, 30% on TEE in 8/18. She has extensive LAD-territory scar. 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  Echo in 1/22 showed that EF remained 25-30% with LAD-territory scar.  - Euvolemic on exam today. Hold diuretic  - Chronically NYHA Class II-III w/  prominent fatigue  - Low BP has made adjustment of GDMT difficult. Entresto and spiro currently on hold per above  - Did not tolerate SGLT2i due to yeast infections  - narrow QRS, not candidate for CRT-D - Being considered for Bartostim   4. PAF - intolerant to amio due to GI side effects - on Sotalol, maintaining NSR - Eliqus on hold for GIB - Given h/o recurrent GIB, will need to consider WatchMan Device. Will need f/u w/ EP post d/c   5. CAD  - S/p anterior MI in 7/17 with DES to RCA.  Cath in 10/19 with no obstructive CAD. - Stable w/o CP  - not on ASA/Plavix given use of Eliquis for Afib  - on Crestor 10  - Last LP w/ LDL 53   6. Mitral Regurgitation  - Moderate to severe on 10/17 echo, severe on 11/17 echo. TEE 8/18 only mod MR, appeared functional. Echo in 1/22 showed mild-moderate MR.   7. PVCs - 41% burden on holter 12/18, s/p PVC ablation but developed recurrent PVCs, now suppressed w/ sotalol    Length of Stay: 3  Brittainy Simmons, PA-C  12/04/2020, 8:10 AM  Advanced Heart Failure Team Pager [phone] (M-F; 7a - 5p)  Please contact [facility] for night-coverage after hours (5p -7a ) and weekends on [email or web address]  Patient seen with PA, agree with the above note.    Hgb 7.1 today, she is getting a unit PRBCs currently.  She is off her cardiac meds except for sotalol and Crestor.  SBP 80s-90s.  No lightheadedness or dyspnea.  No chest pain.   Capsule endoscopy showed likely source of bleeding in distal small bowel, cannot be reached by traditional endoscopy.    General: NAD Neck: No JVD, no thyromegaly or thyroid nodule.  Lungs: Clear to auscultation bilaterally with normal respiratory effort. CV: Nondisplaced PMI.  Heart regular S1/S2, no S3/S4, no murmur.  No peripheral edema.   Abdomen: Soft, nontender, no hepatosplenomegaly, no distention.  Skin: Intact without lesions or rashes.  Neurologic: Alert and oriented x 3.  Psych: Normal affect. Extremities: No  clubbing or cyanosis.  HEENT: Normal.   Patient is off apixaban for now due to GI bleeding.  Will need eventual plan for resumption (?try after holding for another week or so). She would likely be a Watchman candidate.  This is her 2nd significant GI bleed, unfortunately the source appears to be distal small bowel which is difficult to reach.   She does not seem to be briskly bleeding, not sure IR embolization would be successful. Sounds like plan will be to watch her for now with referral to [facility] for double balloon endoscopy if bleeding returns.   She will continue sotalol, remains on NSR.  Entresto and spironolactone on hold. She runs  low BP at baseline.  Would try starting back spironolactone tomorrow if stable.  She will be talking about barostim device with our research nurse tomorrow.   Marca Ancona 12/04/2020 1:42 PM

## 2020-12-05 ENCOUNTER — Encounter (HOSPITAL_COMMUNITY): Payer: BC Managed Care – PPO | Admitting: Cardiology

## 2020-12-05 ENCOUNTER — Ambulatory Visit (HOSPITAL_COMMUNITY): Payer: BC Managed Care – PPO

## 2020-12-05 LAB — BPAM RBC
Blood Product Expiration Date: 202210182359
ISSUE DATE / TIME: 202210111156
Unit Type and Rh: 600

## 2020-12-05 LAB — TYPE AND SCREEN
ABO/RH(D): A NEG
Antibody Screen: NEGATIVE
Unit division: 0

## 2020-12-06 ENCOUNTER — Other Ambulatory Visit (HOSPITAL_COMMUNITY): Payer: Self-pay | Admitting: Cardiology

## 2020-12-07 ENCOUNTER — Encounter (HOSPITAL_COMMUNITY): Payer: Self-pay | Admitting: Gastroenterology

## 2020-12-07 NOTE — Addendum Note (Signed)
Addendum  created 12/07/20 1018 by Grove Defina, Nelle Don, DO   Intraprocedure Event edited, Intraprocedure Staff edited

## 2020-12-12 ENCOUNTER — Encounter (HOSPITAL_COMMUNITY): Payer: BC Managed Care – PPO | Admitting: Cardiology

## 2020-12-13 ENCOUNTER — Telehealth: Payer: Self-pay | Admitting: Internal Medicine

## 2020-12-13 NOTE — Telephone Encounter (Signed)
Patient called wanting to know if you'd heard from [facility] about scheduling her appointment.  Please call and advise.  Thank you.

## 2020-12-13 NOTE — Telephone Encounter (Signed)
Thanks Ammie, I told the patient to call me if she didn't hear back from [facility]. Called the [facility] appt line. They told me that the patient is scheduled for DBE with Dr. Faylene Million on 01/01/21 at 9:30 AM (patient must arrive at 8:30 AM). I then called the patient who said she just heard back from [facility] about this appointment. She is doing well, has not seen any more GI bleeding. She is looking forward to getting her DBE procedure.

## 2020-12-14 ENCOUNTER — Encounter (HOSPITAL_COMMUNITY): Payer: Self-pay | Admitting: *Deleted

## 2020-12-14 ENCOUNTER — Other Ambulatory Visit: Payer: Self-pay

## 2020-12-14 ENCOUNTER — Ambulatory Visit (HOSPITAL_COMMUNITY)
Admit: 2020-12-14 | Discharge: 2020-12-14 | Disposition: A | Discharge status: Home / Self Care | Payer: BC Managed Care – PPO | Source: Ambulatory Visit | Attending: Family Medicine | Admitting: Family Medicine

## 2020-12-14 ENCOUNTER — Encounter (HOSPITAL_COMMUNITY): Payer: Self-pay

## 2020-12-14 VITALS — BP 89/64 | HR 62 | Wt 152.4 lb

## 2020-12-14 DIAGNOSIS — I5022 Chronic systolic (congestive) heart failure: Secondary | ICD-10-CM

## 2020-12-14 DIAGNOSIS — Z8249 Family history of ischemic heart disease and other diseases of the circulatory system: Secondary | ICD-10-CM | POA: Diagnosis not present

## 2020-12-14 DIAGNOSIS — Z95810 Presence of automatic (implantable) cardiac defibrillator: Secondary | ICD-10-CM | POA: Diagnosis not present

## 2020-12-14 DIAGNOSIS — I340 Nonrheumatic mitral (valve) insufficiency: Secondary | ICD-10-CM | POA: Diagnosis not present

## 2020-12-14 DIAGNOSIS — I252 Old myocardial infarction: Secondary | ICD-10-CM | POA: Insufficient documentation

## 2020-12-14 DIAGNOSIS — I255 Ischemic cardiomyopathy: Secondary | ICD-10-CM | POA: Diagnosis not present

## 2020-12-14 DIAGNOSIS — I959 Hypotension, unspecified: Secondary | ICD-10-CM

## 2020-12-14 DIAGNOSIS — I2510 Atherosclerotic heart disease of native coronary artery without angina pectoris: Secondary | ICD-10-CM | POA: Diagnosis not present

## 2020-12-14 DIAGNOSIS — Z8719 Personal history of other diseases of the digestive system: Secondary | ICD-10-CM

## 2020-12-14 DIAGNOSIS — I480 Paroxysmal atrial fibrillation: Secondary | ICD-10-CM | POA: Diagnosis not present

## 2020-12-14 DIAGNOSIS — I493 Ventricular premature depolarization: Secondary | ICD-10-CM | POA: Diagnosis not present

## 2020-12-14 DIAGNOSIS — Z955 Presence of coronary angioplasty implant and graft: Secondary | ICD-10-CM | POA: Diagnosis not present

## 2020-12-14 LAB — BASIC METABOLIC PANEL
Anion gap: 6 (ref 5–15)
BUN: 18 mg/dL (ref 6–20)
CO2: 26 mmol/L (ref 22–32)
Calcium: 8.7 mg/dL — ABNORMAL LOW (ref 8.9–10.3)
Chloride: 105 mmol/L (ref 98–111)
Creatinine, Ser: 0.98 mg/dL (ref 0.44–1.00)
GFR, Estimated: >60 mL/min (ref >60)
Glucose, Bld: 106 mg/dL — ABNORMAL HIGH (ref 70–99)
Potassium: 4.4 mmol/L (ref 3.5–5.1)
Sodium: 137 mmol/L (ref 135–145)

## 2020-12-14 LAB — CBC
HCT: 30.9 % — ABNORMAL LOW (ref 36.0–46.0)
Hemoglobin: 9.6 g/dL — ABNORMAL LOW (ref 12.0–15.0)
MCH: 30 pg (ref 26.0–34.0)
MCHC: 31.1 g/dL (ref 30.0–36.0)
MCV: 96.6 fL (ref 80.0–100.0)
Platelets: 235 10*3/uL (ref 150–400)
RBC: 3.2 MIL/uL — ABNORMAL LOW (ref 3.87–5.11)
RDW: 15.9 % — ABNORMAL HIGH (ref 11.5–15.5)
WBC: 4.7 10*3/uL (ref 4.0–10.5)
nRBC: 0 % (ref 0.0–0.2)

## 2020-12-14 NOTE — Patient Instructions (Signed)
Labs done today, your results will be available in MyChart, we will contact you for abnormal readings.  You have been referred to Structural Heart Team for [facility], they will call  you for an appointment   Your physician recommends that you schedule a follow-up appointment in: 12/28/20 with Dr Shirlee Latch as scheduled  If you have any questions or concerns before your next appointment please send [place] a message through [facility] or call our office at [phone].    TO LEAVE A MESSAGE FOR THE NURSE SELECT OPTION 2, PLEASE LEAVE A MESSAGE INCLUDING: YOUR NAME DATE OF BIRTH CALL BACK NUMBER REASON FOR CALL**this is important as we prioritize the call backs  YOU WILL RECEIVE A CALL BACK THE SAME DAY AS LONG AS YOU CALL BEFORE 4:00 PM  At the Advanced [facility] Failure Clinic, you and your health needs are our priority. As part of our continuing mission to provide you with exceptional heart care, we have created designated Provider Care Teams. These Care Teams include your primary Cardiologist (physician) and Advanced Practice Providers (APPs- Physician Assistants and Nurse Practitioners) who all work together to provide you with the care you need, when you need it.   You may see any of the following providers on your designated Care Team at your next follow up: Dr Arvilla Meres Dr Carron Curie, NP Robbie Lis, [place] [facility] [place], [place] Karle Plumber, PharmD   Please be sure to bring in all your medications bottles to every appointment.

## 2020-12-14 NOTE — Progress Notes (Addendum)
PCP: Dr. Margo Aye HF Cardiology: Dr. Shirlee Latch  55 y.o. with history of CAD s/p anterior STEMI with DES to LAD, ischemic cardiomyopathy, mitral regurgitation, and paroxysmal atrial fibrillation presents for followup of CHF and CAD.  She was admitted in 7/17 with anterior STEMI.  She had a late presentation to the cath lab.  Echo in 10/17 showed EF about 25% with LAD-territory wall motion abnormalities.  She had peri-MI atrial fibrillation and was started on Xarelto.  She was put on amiodarone.   After discharge, she continued to feel poorly.  She developed nausea, anorexia, and significant fatigue.  She was admitted for evaluation in 10/17 given concern for low output heart failure.  However, stopping amiodarone essentially resolved her symptoms.  She had RHC showing preserved cardiac output but the PCWP was high due to prominent v-waves, presumably from significant mitral regurgitation.  Of note, she was in atrial fibrillation for a time during this admission.   She had [employer] ICD placed.  She is back at work for the [employer].   TEE in 8/18 to evaluate the mitral valve showed EF 30%, moderate MR (probably functional).    Patient was seen by Dr. Elberta Fortis and noted to have very frequent PVCs on device interrogation.  Holter in 12/18 showed 41% PVCs.  She was started on sotalol 80 mg bid and titrated up to 120 mg bid.  Coreg was stopped with the addition of sotalol and losartan was decreased to 12.5 mg daily due to low BP.  She had PVC ablation in 2/19 but PVCs recurred. Sotalol was increased to 160 mg bid.   Echo 8/19 showed EF 30% with regional wall motion abnormalities, normal RV size and systolic function, moderate MR.   She was admitted in 10/19 with chest pain.  Cath showed patent LAD stent and no obstructive CAD.  She was found to be anemic with Fe deficiency (hgb down to 8).  FOBT+. She has had IV Fe and blood transfusion.  EGD and colonoscopy this year were fairly unremarkable.  She  had diverticulosis.  Capsule endoscopy negative in 9/21.   Echo in 1/22 showed EF 25-30%, wall motion abnormalities in LAD distribution, mild-moderate MR, normal RV.   Patient was last seen by Dr. Shirlee Latch in 09/2020 time she was doing relatively well from a cardiac standpoint.  Losartan and Lasix were stopped and she was started on Entresto 24-26 mg twice daily.  She was also evaluated for the [facility] and ANTHEM studies.   Admitted 11/28/2020 with anemia, hemoglobin of 8.6 (baseline around 10-11). She was transfused 1 unit of PRBC on 11/29/2020. GI was consulted and she underwent EGD on 11/30/2020 which showed no source of bleeding.  Capsule endoscopy on 12/01/2020 showed blood in the mid/distal small bowel and proximal colon.  Colonoscopy on 12/02/2020 showed blood in the terminal terminal ileum but no clear source.  It is suspected that the source of bleeding is in the distal small bowel that is not able to be reached via routine colonoscope.  GI asked IR to consider an arteriogram to localize and treat the source of her bleeding. Cardiology consulted for assistance with anticoagulation. GDMT limited by low BP, Entresto and spiro held. Eliquis held with plans to resume in a couple weeks. Would need to consider Watchman if bleeding returns.  Today she returns for post hospitalization HF follow up with her husband. She remains fatigued but notices improvement since discharge. No abnormal bleeding.. Her dizziness is about the same but no  falls. Denies increasing SOB, CP, edema, or PND/Orthopnea. Appetite ok. No fever or chills.  Taking all medications. Planning on DBE 01/01/[facility].at [facility]. Asking about BaroStim.  ECG (personally reviewed): SR LBBB  Device Interrogation: CoreVue stable suggesting euvolemia  Labs (10/17): K 3.9, creatinine 1.24, hgb 10.3 Labs (11/17): K 4, creatinine 1.27 Labs (12/17): K 4.2, creatinine 1.12, BNP 439 Labs (2/18): K 4.7, creatinine 1.37 Labs (7/18): K 3.9, creatinine 1.14, hgb  11.7 Labs (12/18): K 4.5, creatinine 1.23 Labs (1/19): K 4.4, creatinine 1.14 Labs (2/19): LDL 97 (off atorvastatin), HDL 56 Labs (5/19): K 4.3, creatinine 1.1 Labs (10/19): K 4.2, creatinine 1.2, LDL 70, hgb 11.8 Labs (11/19): K 4.9, creatinine 1.17, hgb 11.8, LDL 68, low TSH  Labs (9/20): K 4.4, creatinine 1.34, LDL 52 Labs (12/20): K 4.6, creatinine 1.19 Labs (6/21): LDL 73, HDL 62, hgb 11.9, K 4.2, creatinine 1.14 Labs (7/21): hgb 11.2, K 3.8, creatinine 1.12 Labs (10/21): K 4.5, creatinine 1.38, LDL 53, HDL 50 Labs (1/22): K 4.9, creatinine 0.99 Labs (2/22): hgb 11.6, Fe studies normal Labs (5/22): K 5, creatinine 1.05, hgb 11.8 Labs (10/22): K 3.9, creatinine 1.02, hgb 7.1  PMH:  1. CAD: Anterior STEMI in 7/17 with late presentation to the cath lab. She had DES to proximal LAD.  No significant disease in other vessels.  - LHC (10/19): Patent LAD stent, no obstructive disease.  2. Chronic systolic CHF: Ischemic cardiomyopathy.  - Echo 10/17 with EF 25%, akinesis of the mid-apical anteroseptal and anterior walls, apical dyskinesis, moderate to severe MR with incomplete coaptation.  - RHC (10/17): mean RA 4, PA 59/17 mean 38, mean PCWP 27 with prominent V waves suggesting significant MR, CI 3.06, PVR 2 WU.  - ACEI cough.  - Echo (11/17): EF 30-35%, normal RV size and systolic function, severe MR with restriction of posterior leaflet.   - Echo (8/19): EF 30% with regional wall motion abnormalities, normal RV size and systolic function, moderate MR.  - Echo (12/20): EF 30%, LAD wall motion abnormalities, normal RV, PASP 35, moderate functional MR.  - Echo (1/22): EF 25-30%, wall motion abnormalities in LAD distribution, mild-moderate MR, normal RV.  3. Atrial fibrillation: Paroxysmal.  Noted 7/17 admission peri-MI, also noted again 10/17 admission. She did not tolerate amiodarone due to nausea/anorexia.  4. Mitral regurgitation: Moderate to severe on 10/17 echo, incomplete coaptation.   ?Etiology, her MI (anterior) does not typically lead to ischemic MR. - Echo in 11/17 with severe MR, posterior leaflet restricted.  - TEE (8/18): EF 30%, moderately dilated LV with akinetic septal and anterior walls, moderate functional MR, normal RV size and systolic function.  - Echo (8/19): Moderate MR 5. Hyperlipidemia: Myalgias with atorvastatin.  6. PVCs: Holter 12/18 with 41% PVCs.  - PVC ablation in 2/19 with recurrence of PVCs.  7. GI bleeding:  - EGD (4/21): unremarkable.  - Colonoscopy (7/21): Diverticulosis.  - Capsule endoscopy (9/21): negative.   SH: Married, lives in [place], works for the [employer], no smoking or ETOH.   Family History  Problem Relation Age of Onset   Heart attack Father    Arrhythmia Mother    Lung cancer Mother        bronchiectasis   Colon cancer Neg Hx    Colon polyps Neg Hx    ROS: All systems reviewed and negative except as per HPI.   Current Outpatient Medications on File Prior to Encounter  Medication Sig Dispense Refill   acetaminophen (TYLENOL) 500 MG tablet Take  1,000 mg by mouth every 6 (six) hours as needed for mild pain.     buPROPion (WELLBUTRIN XL) 300 MG 24 hr tablet Take 300 mg by mouth daily.     Calcium Carb-Cholecalciferol (CALCIUM + D3 PO) Take 1 tablet by mouth every morning.     Coenzyme Q10 (COQ10) 200 MG CAPS Take 200 mg by mouth daily.     dexlansoprazole (DEXILANT) 60 MG capsule Take 1 capsule (60 mg total) by mouth daily. 90 capsule 3   fexofenadine (ALLEGRA) 180 MG tablet Take 180 mg by mouth daily.     nitroGLYCERIN (NITROSTAT) 0.4 MG SL tablet PLACE 1 TABLET UNDER THE TONGUE EVERY 5 MINUTES AS NEEDED FOR CHEST PAIN 25 tablet 3   rosuvastatin (CRESTOR) 10 MG tablet TAKE 1 TABLET(10 MG) BY MOUTH DAILY 90 tablet 3   sotalol (BETAPACE) 160 MG tablet TAKE 1 TABLET BY MOUTH EVERY MORNING AND 1/2 TABLET EVERY EVENING 45 tablet 6   spironolactone (ALDACTONE) 25 MG tablet Take 0.5 tablets (12.5 mg total) by mouth daily. START 10/12  90 tablet 2   thyroid (ARMOUR) 120 MG tablet Take 120 mg by mouth daily before breakfast.     traZODone (DESYREL) 50 MG tablet Take 0.5 tablets (25 mg total) by mouth at bedtime as needed for sleep. 30 tablet 2   triamcinolone (NASACORT) 55 MCG/ACT AERO nasal inhaler Place 2 sprays into the nose daily.      No current facility-administered medications on file prior to encounter.   Wt Readings from Last 3 Encounters:  12/14/20 69.1 kg (152 lb 6.4 oz)  12/03/20 70.6 kg (155 lb 10.3 oz)  10/19/20 67.9 kg (149 lb 9.6 oz)   BP (!) 89/64   Pulse 62   Wt 69.1 kg (152 lb 6.4 oz)   SpO2 99%   BMI 22.51 kg/m   General:  NAD. No resp difficulty HEENT: Normal Neck: Supple. No JVD. Carotids 2+ bilat; no bruits. No lymphadenopathy or thryomegaly appreciated. Cor: PMI nondisplaced. Regular rate & rhythm. No rubs, gallops or murmurs. Lungs: Clear Abdomen: Soft, nontender, nondistended. No hepatosplenomegaly. No bruits or masses. Good bowel sounds. Extremities: No cyanosis, clubbing, rash, edema Neuro: Alert & oriented x 3, cranial nerves grossly intact. Moves all 4 extremities w/o difficulty. Affect pleasant.  Assessment/Plan: H/o GIB - Admit Hgb 8.6 (baseline ~11). FOBT+. Eliquis held. - EGD 10/7: unremarkable  - VCE 10/8: showed blood in the mid/distal SB and proximal colon - Colonoscopy 10/9: showed blood in the terminal ileum but no clear source  - Suspected that the source of bleeding is in the distal small bowel. Awaiting IR arteriogram/embolization - No abnormal bleeding. - CBC today. - Per GI she is to remain off Eliquis until DBE at [facility] next month. Discussed with Dr. Shirlee Latch.   2. Hypotension  - 2/2 recent GIB/ABLA, SBPs low 90s but no orthostatic symptoms - Continue to hold Entresto - Continue sotolol given h/o PVCs/NSVT    3. Chronic Systolic Heart Failure - ICM. EF 25% on echo in 10/17, 30-35% on echo in 11/17, 30% on TEE in 8/18. She has extensive LAD-territory scar. She has  a [employer] ICD.  Echo in 1/22 showed that EF remained 25-30% with LAD-territory scar.  - Chronically NYHA Class II-III w/ prominent fatigue. She is not volume overloaded today on exam or by device interrogation.  - GDMT imited by low BP. - Continue spiro 12.5 mg daily. - No BP room for ARB/ARNi - Did not tolerate SGLT2i due  to yeast infections  - narrow QRS, not candidate for CRT-D - Being considered for Bartostim, will have to wait 3 months post Watchman, discussed with research RN. - BMET today.    4. PAF - Intolerant to amio due to GI side effects - Continue Sotalol. - SR on ECG today. - Off Eliquis w/ recent recurrent GIB. - Refer to EP for [facility].    5. CAD  - S/p anterior MI in 7/17 with DES to RCA.  Cath in 10/19 with no obstructive CAD. - Previously on Eliquis. - Continue Crestor 10  - Last LP w/ LDL 53  - No chest pain.   6. Mitral Regurgitation  - Moderate to severe on 10/17 echo, severe on 11/17 echo. TEE 8/18 only mod MR, appeared functional. Echo in 1/22 showed mild-moderate MR.    7. PVCs - 41% burden on holter 12/18, s/p PVC ablation but developed recurrent PVCs, now suppressed w/ sotalol   Follow up with Dr. Shirlee Latch next month as scheduled.  Prince Rome, FNP-BC 12/14/20

## 2020-12-14 NOTE — Progress Notes (Signed)
Pt's disability forms completed during visit today with expected RTW 02/26/21, forms faxed to [employer] [phone], pt provided copy along with a letter

## 2020-12-18 ENCOUNTER — Telehealth: Payer: Self-pay

## 2020-12-18 ENCOUNTER — Ambulatory Visit (INDEPENDENT_AMBULATORY_CARE_PROVIDER_SITE_OTHER): Payer: BC Managed Care – PPO

## 2020-12-18 DIAGNOSIS — I255 Ischemic cardiomyopathy: Secondary | ICD-10-CM | POA: Diagnosis not present

## 2020-12-18 LAB — CUP PACEART REMOTE DEVICE CHECK
Battery Remaining Longevity: 61 mo
Battery Remaining Percentage: 60 %
Battery Voltage: 2.95 V
Brady Statistic RV Percent Paced: 1 %
Date Time Interrogation Session: 20221025151053
HighPow Impedance: 70 Ohm
HighPow Impedance: 70 Ohm
Implantable Lead Implant Date: 20171129
Implantable Lead Location: 753860
Implantable Pulse Generator Implant Date: 20171129
Lead Channel Impedance Value: 450 Ohm
Lead Channel Pacing Threshold Amplitude: 1.5 V
Lead Channel Pacing Threshold Pulse Width: 0.5 ms
Lead Channel Sensing Intrinsic Amplitude: 12 mV
Lead Channel Setting Pacing Amplitude: 2.5 V
Lead Channel Setting Pacing Pulse Width: 0.5 ms
Lead Channel Setting Sensing Sensitivity: 0.5 mV
Pulse Gen Serial Number: 7377983

## 2020-12-18 NOTE — Telephone Encounter (Signed)
The patient is scheduled for DBE at [facility] 01/01/2021 and a/c will be on hold.  The patient is in agreement to [facility] consult 01/15/2021 to get through DBE procedure. She was grateful for assistance.

## 2020-12-18 NOTE — Telephone Encounter (Signed)
Per [facility], called to arrange LAAO consult with Dr. Lalla Brothers.   Left message to call back.

## 2020-12-25 NOTE — Telephone Encounter (Signed)
I spoke with [facility] Pre Anesthesia.  They are requesting recent HF records and recent echo for upcoming endoscopy be faxed to [phone].  Unable to view in CE.  Routed via [employer] function.

## 2020-12-25 NOTE — Telephone Encounter (Signed)
Calling to speak with nurse/dr regarding patient upcoming appt

## 2020-12-27 NOTE — Progress Notes (Signed)
Remote ICD transmission.   

## 2020-12-28 ENCOUNTER — Other Ambulatory Visit: Payer: Self-pay

## 2020-12-28 ENCOUNTER — Ambulatory Visit (HOSPITAL_COMMUNITY)
Admission: RE | Admit: 2020-12-28 | Discharge: 2020-12-28 | Disposition: A | Discharge status: Home / Self Care | Payer: BC Managed Care – PPO | Source: Ambulatory Visit | Attending: Cardiology | Admitting: Cardiology

## 2020-12-28 VITALS — BP 110/70 | HR 70 | Ht 69.0 in | Wt 151.8 lb

## 2020-12-28 DIAGNOSIS — I2510 Atherosclerotic heart disease of native coronary artery without angina pectoris: Secondary | ICD-10-CM | POA: Insufficient documentation

## 2020-12-28 DIAGNOSIS — I480 Paroxysmal atrial fibrillation: Secondary | ICD-10-CM | POA: Diagnosis not present

## 2020-12-28 DIAGNOSIS — I340 Nonrheumatic mitral (valve) insufficiency: Secondary | ICD-10-CM | POA: Insufficient documentation

## 2020-12-28 DIAGNOSIS — Z8719 Personal history of other diseases of the digestive system: Secondary | ICD-10-CM

## 2020-12-28 DIAGNOSIS — I252 Old myocardial infarction: Secondary | ICD-10-CM | POA: Diagnosis not present

## 2020-12-28 DIAGNOSIS — Z8249 Family history of ischemic heart disease and other diseases of the circulatory system: Secondary | ICD-10-CM | POA: Diagnosis not present

## 2020-12-28 DIAGNOSIS — I493 Ventricular premature depolarization: Secondary | ICD-10-CM | POA: Insufficient documentation

## 2020-12-28 DIAGNOSIS — Z955 Presence of coronary angioplasty implant and graft: Secondary | ICD-10-CM | POA: Insufficient documentation

## 2020-12-28 DIAGNOSIS — Z95810 Presence of automatic (implantable) cardiac defibrillator: Secondary | ICD-10-CM | POA: Insufficient documentation

## 2020-12-28 DIAGNOSIS — I5022 Chronic systolic (congestive) heart failure: Secondary | ICD-10-CM | POA: Diagnosis present

## 2020-12-28 DIAGNOSIS — Z7901 Long term (current) use of anticoagulants: Secondary | ICD-10-CM | POA: Diagnosis not present

## 2020-12-28 DIAGNOSIS — I959 Hypotension, unspecified: Secondary | ICD-10-CM

## 2020-12-28 DIAGNOSIS — I255 Ischemic cardiomyopathy: Secondary | ICD-10-CM | POA: Insufficient documentation

## 2020-12-28 LAB — CBC
HCT: 36 % (ref 36.0–46.0)
Hemoglobin: 11.5 g/dL — ABNORMAL LOW (ref 12.0–15.0)
MCH: 29.9 pg (ref 26.0–34.0)
MCHC: 31.9 g/dL (ref 30.0–36.0)
MCV: 93.5 fL (ref 80.0–100.0)
Platelets: 191 10*3/uL (ref 150–400)
RBC: 3.85 MIL/uL — ABNORMAL LOW (ref 3.87–5.11)
RDW: 14.5 % (ref 11.5–15.5)
WBC: 5 10*3/uL (ref 4.0–10.5)
nRBC: 0 % (ref 0.0–0.2)

## 2020-12-28 LAB — BASIC METABOLIC PANEL
Anion gap: 6 (ref 5–15)
BUN: 23 mg/dL — ABNORMAL HIGH (ref 6–20)
CO2: 28 mmol/L (ref 22–32)
Calcium: 9 mg/dL (ref 8.9–10.3)
Chloride: 103 mmol/L (ref 98–111)
Creatinine, Ser: 0.93 mg/dL (ref 0.44–1.00)
GFR, Estimated: >60 mL/min (ref >60)
Glucose, Bld: 92 mg/dL (ref 70–99)
Potassium: 4.1 mmol/L (ref 3.5–5.1)
Sodium: 137 mmol/L (ref 135–145)

## 2020-12-28 LAB — BRAIN NATRIURETIC PEPTIDE: B Natriuretic Peptide: 346.7 pg/mL — ABNORMAL HIGH (ref 0.0–100.0)

## 2020-12-28 MED ORDER — APIXABAN 5 MG PO TABS: 5.0000 mg | ORAL_TABLET | Freq: Two times a day (BID) | ORAL | 3 refills | Status: DC

## 2020-12-28 NOTE — Patient Instructions (Signed)
Labs done today. We will contact you only if your labs are abnormal.  RESTART Eliquis 5mg  (1 tablet) by mouth 2 times daily.   No other medication changes were made. Please continue all current medications as prescribed.  Your physician recommends that you schedule a follow-up appointment in: 2 weeks for a lab only appointment and in 4 months with Dr.  If you have any questions or concerns before your next appointment please send Shirlee Latch a message through [facility] or call our office at [phone].    TO LEAVE A MESSAGE FOR THE NURSE SELECT OPTION 2, PLEASE LEAVE A MESSAGE INCLUDING: YOUR NAME DATE OF BIRTH CALL BACK NUMBER REASON FOR CALL**this is important as we prioritize the call backs  YOU WILL RECEIVE A CALL BACK THE SAME DAY AS LONG AS YOU CALL BEFORE 4:00 PM   Do the following things EVERYDAY: Weigh yourself in the morning before breakfast. Write it down and keep it in a log. Take your medicines as prescribed Eat low salt foods--Limit salt (sodium) to 2000 mg per day.  Stay as active as you can everyday Limit all fluids for the day to less than 2 liters   At the [facility], you and your health needs are our priority. As part of our continuing mission to provide you with exceptional heart care, we have created designated Provider Care Teams. These Care Teams include your primary Cardiologist (physician) and Advanced Practice Providers (APPs- Physician Assistants and Nurse Practitioners) who all work together to provide you with the care you need, when you need it.   You may see any of the following providers on your designated Care Team at your next follow up: Dr [phone] Dr Arvilla Meres, NP Carron Curie, Robbie Lis [place], PharmD   Please be sure to bring in all your medications bottles to every appointment.

## 2020-12-28 NOTE — Progress Notes (Addendum)
\   ADVANCED HF CLINIC NOTE  PCP: Dr. Nevada Crane HF Cardiology: Dr. Aundra Dubin  [name] is a 55 y.o. with history of CAD s/p anterior STEMI with DES to LAD, ischemic cardiomyopathy, mitral regurgitation, and paroxysmal atrial fibrillation.  She was admitted in 7/17 with anterior STEMI.  She had a late presentation to the cath lab.  Echo in 10/17 showed EF about 25% with LAD-territory wall motion abnormalities.  She had peri-MI atrial fibrillation and was started on Xarelto.  She was put on amiodarone.   After discharge, she continued to feel poorly.  She developed nausea, anorexia, and significant fatigue.  She was admitted for evaluation in 10/17 given concern for low output heart failure.  However, stopping amiodarone essentially resolved her symptoms.  She had RHC showing preserved cardiac output but the PCWP was high due to prominent v-waves, presumably from significant mitral regurgitation.  Of note, she was in atrial fibrillation for a time during this admission.   She had St Jude ICD placed.  She is back at work for the [employer].   TEE in 8/18 to evaluate the mitral valve showed EF 30%, moderate MR (probably functional).    Patient was seen by Dr. Curt Bears and noted to have very frequent PVCs on device interrogation.  Holter in 12/18 showed 41% PVCs.  She was started on sotalol 80 mg bid and titrated up to 120 mg bid.  Coreg was stopped with the addition of sotalol and losartan was decreased to 12.5 mg daily due to low BP.  She had PVC ablation in 2/19 but PVCs recurred. Sotalol was increased to 160 mg bid.   Echo 8/19 showed EF 30% with regional wall motion abnormalities, normal RV size and systolic function, moderate MR.   She was admitted in 10/19 with chest pain.  Cath showed patent LAD stent and no obstructive CAD.  She was found to be anemic with Fe deficiency (hgb down to 8).  FOBT+. She has had IV Fe and blood transfusion.  EGD and colonoscopy this year were fairly  unremarkable.  She had diverticulosis.  Capsule endoscopy negative in 9/21.   Echo in 1/22 showed EF 25-30%, wall motion abnormalities in LAD distribution, mild-moderate MR, normal RV.   Seen in 09/2020 time she was doing relatively well from a cardiac standpoint.  Losartan and Lasix were stopped and she was started on Entresto 24-26 mg twice daily.  She was also evaluated for the [facility] and ANTHEM studies.   Admitted 11/28/2020 with anemia, hemoglobin of 8.6 (baseline around 10-11). She was transfused 1 unit of PRBC on 11/29/2020. GI was consulted and she underwent EGD on 11/30/2020 which showed no source of bleeding.  Capsule endoscopy on 12/01/2020 showed blood in the mid/distal small bowel and proximal colon.  Colonoscopy on 12/02/2020 showed blood in the terminal ileum but no clear source.  It is suspected that the source of bleeding is in the distal small bowel that is not able to be reached via routine colonoscope.  GI asked IR to consider an arteriogram to localize and treat the source of her bleeding. CT done and felt best option was DBE with [facility]. [facility]. That procedure now scheduled for 12/20.   Says she is doing fairly well. Denies any recurrent bleeding hgb 9.6 -> 10.4. Still with easy fatigue. No CP, edema or orthopnea. Has been off Entresto. SBP remains in 80-90s. Very concerned about holding her blood thinner   ICD interrogated personally in clinic: No  VT/AF noted. Volume ok. Personally reviewed   Labs (10/17): K 3.9, creatinine 1.24, hgb 10.3 Labs (11/17): K 4, creatinine 1.27 Labs (12/17): K 4.2, creatinine 1.12, BNP 439 Labs (2/18): K 4.7, creatinine 1.37 Labs (7/18): K 3.9, creatinine 1.14, hgb 11.7 Labs (12/18): K 4.5, creatinine 1.23 Labs (1/19): K 4.4, creatinine 1.14 Labs (2/19): LDL 97 (off atorvastatin), HDL 56 Labs (5/19): K 4.3, creatinine 1.1 Labs (10/19): K 4.2, creatinine 1.2, LDL 70, hgb 11.8 Labs (11/19): K 4.9, creatinine 1.17, hgb 11.8, LDL 68, low TSH  Labs  (9/20): K 4.4, creatinine 1.34, LDL 52 Labs (12/20): K 4.6, creatinine 1.19 Labs (6/21): LDL 73, HDL 62, hgb 11.9, K 4.2, creatinine 1.14 Labs (7/21): hgb 11.2, K 3.8, creatinine 1.12 Labs (10/21): K 4.5, creatinine 1.38, LDL 53, HDL 50 Labs (1/22): K 4.9, creatinine 0.99 Labs (2/22): hgb 11.6, Fe studies normal Labs (5/22): K 5, creatinine 1.05, hgb 11.8 Labs (10/22): K 3.9, creatinine 1.02, hgb 7.1  PMH:  1. CAD: Anterior STEMI in 7/17 with late presentation to the cath lab. She had DES to proximal LAD.  No significant disease in other vessels.  - LHC (10/19): Patent LAD stent, no obstructive disease.  2. Chronic systolic CHF: Ischemic cardiomyopathy.  - Echo 10/17 with EF 25%, akinesis of the mid-apical anteroseptal and anterior walls, apical dyskinesis, moderate to severe MR with incomplete coaptation.  - RHC (10/17): mean RA 4, PA 59/17 mean 38, mean PCWP 27 with prominent V waves suggesting significant MR, CI 3.06, PVR 2 WU.  - ACEI cough.  - Echo (11/17): EF 30-35%, normal RV size and systolic function, severe MR with restriction of posterior leaflet.   - Echo (8/19): EF 30% with regional wall motion abnormalities, normal RV size and systolic function, moderate MR.  - Echo (12/20): EF 30%, LAD wall motion abnormalities, normal RV, PASP 35, moderate functional MR.  - Echo (1/22): EF 25-30%, wall motion abnormalities in LAD distribution, mild-moderate MR, normal RV.  3. Atrial fibrillation: Paroxysmal.  Noted 7/17 admission peri-MI, also noted again 10/17 admission. She did not tolerate amiodarone due to nausea/anorexia.  4. Mitral regurgitation: Moderate to severe on 10/17 echo, incomplete coaptation.  ?Etiology, her MI (anterior) does not typically lead to ischemic MR. - Echo in 11/17 with severe MR, posterior leaflet restricted.  - TEE (8/18): EF 30%, moderately dilated LV with akinetic septal and anterior walls, moderate functional MR, normal RV size and systolic function.  - Echo  (8/19): Moderate MR 5. Hyperlipidemia: Myalgias with atorvastatin.  6. PVCs: Holter 12/18 with 41% PVCs.  - PVC ablation in 2/19 with recurrence of PVCs.  7. GI bleeding:  - EGD (4/21): unremarkable.  - Colonoscopy (7/21): Diverticulosis.  - Capsule endoscopy (9/21): negative.   SH: Married, lives in [place], works for the [employer], no smoking or ETOH.   Family History  Problem Relation Age of Onset   Heart attack Father    Arrhythmia Mother    Lung cancer Mother        bronchiectasis   Colon cancer Neg Hx    Colon polyps Neg Hx    ROS: All systems reviewed and negative except as per HPI.   Current Outpatient Medications on File Prior to Encounter  Medication Sig Dispense Refill   acetaminophen (TYLENOL) 500 MG tablet Take 1,000 mg by mouth every 6 (six) hours as needed for mild pain.     buPROPion (WELLBUTRIN XL) 300 MG 24 hr tablet Take 300 mg by mouth daily.  Calcium Carb-Cholecalciferol (CALCIUM + D3 PO) Take 1 tablet by mouth every morning.     Coenzyme Q10 (COQ10) 200 MG CAPS Take 200 mg by mouth daily.     dexlansoprazole (DEXILANT) 60 MG capsule Take 1 capsule (60 mg total) by mouth daily. 90 capsule 3   fexofenadine (ALLEGRA) 180 MG tablet Take 180 mg by mouth daily.     nitroGLYCERIN (NITROSTAT) 0.4 MG SL tablet PLACE 1 TABLET UNDER THE TONGUE EVERY 5 MINUTES AS NEEDED FOR CHEST PAIN 25 tablet 3   rosuvastatin (CRESTOR) 10 MG tablet TAKE 1 TABLET(10 MG) BY MOUTH DAILY 90 tablet 3   sotalol (BETAPACE) 160 MG tablet TAKE 1 TABLET BY MOUTH EVERY MORNING AND 1/2 TABLET EVERY EVENING 45 tablet 6   spironolactone (ALDACTONE) 25 MG tablet Take 0.5 tablets (12.5 mg total) by mouth daily. START 10/12 90 tablet 2   thyroid (ARMOUR) 120 MG tablet Take 120 mg by mouth daily before breakfast.     traZODone (DESYREL) 50 MG tablet Take 0.5 tablets (25 mg total) by mouth at bedtime as needed for sleep. 30 tablet 2   triamcinolone (NASACORT) 55 MCG/ACT AERO nasal inhaler Place 2 sprays  into the nose daily.      polyethylene glycol-electrolytes (NULYTELY) 420 g solution Take by mouth. (Patient not taking: Reported on 12/28/2020)     No current facility-administered medications on file prior to encounter.   Wt Readings from Last 3 Encounters:  12/28/20 68.9 kg (151 lb 12.8 oz)  12/14/20 69.1 kg (152 lb 6.4 oz)  12/03/20 70.6 kg (155 lb 10.3 oz)   BP 110/70   Pulse 70   Ht 5\' 9"  (1.753 m)   Wt 68.9 kg (151 lb 12.8 oz)   SpO2 99%   BMI 22.42 kg/m   General:  NAD. No resp difficulty HEENT: Normal Neck: Supple. No JVD. Carotids 2+ bilat; no bruits. No lymphadenopathy or thryomegaly appreciated. Cor: PMI nondisplaced. Regular rate & rhythm. No rubs, gallops or murmurs. Lungs: Clear Abdomen: Soft, nontender, nondistended. No hepatosplenomegaly. No bruits or masses. Good bowel sounds. Extremities: No cyanosis, clubbing, rash, edema Neuro: Alert & oriented x 3, cranial nerves grossly intact. Moves all 4 extremities w/o difficulty. Affect pleasant.  Assessment/Plan:  Recent GIB in 10/22 - FOBT+. - EGD 10/7: unremarkable  - VCE 10/8: showed blood in the mid/distal SB and proximal colon - Colonoscopy 10/9: showed blood in the terminal ileum but no clear source  - Scheduled for [facility] at [facility] in 12[facility]22 - Remains of Eliquis. No evidence of recent bleeding. Hgb up to 10 - We discussed the risks/benefits of stroke vs bleeding both off/on Eliquis. Given CHADsVASc = 3 (female, CAD, HF) risk of stroke low over 1 month but she would prefer to restart. I think this is reasonable and I also d/w Dr. Lorenso Courier in GI who agreed. She will let us know immediately if she notices any bleeding or melena - she is cleared from CV standpoint for DBE   2. Hypotension  - BP tends to be low at baseline but had been tolerating Entresto prior to GIB  - Continue to hold Entresto for now - Continue sotolol given h/o PVCs/NSVT    3. Chronic Systolic Heart Failure - ICM. EF 25% on echo in 10/17, 30-35%  on echo in 11/17, 30% on TEE in 8/18. She has extensive LAD-territory scar. She has a [employer] ICD.  Echo in 1/22 showed that EF remained 25-30% with LAD-territory scar.  - Chronically NYHA  Class II-III w/ prominent fatigue. She is not volume overloaded today on exam or by ICD interrogation - GDMT imited by low BP. - Off Entresto as above - Continue spiro 12.5 mg daily. - No BP room for ARB/ARNi - Did not tolerate SGLT2i due to yeast infections  - narrow QRS, not candidate for CRT-D - Being considered for Bartostim, will have to wait 3 months post Watchman - BMET today.    4. PAF - In sinus today.  0 Intolerant to amio due to GI side effects - Continue Sotalol. - Resume Eliquis as above - Has been referred to EP for [facility] Device.    5. CAD  - S/p anterior MI in 7/17 with DES to RCA.  Cath in 10/19 with no obstructive CAD. - Previously on Eliquis. - Continue Crestor 10  - Last LP w/ LDL 53  - No s/s angina   6. Mitral Regurgitation  - Moderate to severe on 10/17 echo, severe on 11/17 echo. TEE 8/18 only mod MR, appeared functional. Echo in 1/22 showed mild-moderate MR.    7. PVCs - 41% burden on holter 12/18, s/p PVC ablation but developed recurrent PVCs, now suppressed w/ sotalol  Total time spent 35 minutes. Over half that time spent discussing above.   Arvilla Meres, MD  5:40 PM

## 2021-01-15 ENCOUNTER — Other Ambulatory Visit (HOSPITAL_COMMUNITY): Payer: Self-pay | Admitting: *Deleted

## 2021-01-15 ENCOUNTER — Ambulatory Visit (HOSPITAL_COMMUNITY)
Admission: RE | Admit: 2021-01-15 | Discharge: 2021-01-15 | Disposition: A | Discharge status: Home / Self Care | Payer: BC Managed Care – PPO | Source: Ambulatory Visit | Attending: Family Medicine | Admitting: Family Medicine

## 2021-01-15 ENCOUNTER — Encounter: Payer: Self-pay | Admitting: Cardiology

## 2021-01-15 ENCOUNTER — Other Ambulatory Visit: Payer: Self-pay

## 2021-01-15 ENCOUNTER — Encounter (HOSPITAL_COMMUNITY): Payer: Self-pay | Admitting: *Deleted

## 2021-01-15 ENCOUNTER — Ambulatory Visit (INDEPENDENT_AMBULATORY_CARE_PROVIDER_SITE_OTHER): Payer: BC Managed Care – PPO | Admitting: Cardiology

## 2021-01-15 VITALS — BP 126/72 | HR 63 | Ht 69.0 in | Wt 151.0 lb

## 2021-01-15 DIAGNOSIS — I5022 Chronic systolic (congestive) heart failure: Secondary | ICD-10-CM

## 2021-01-15 DIAGNOSIS — I2510 Atherosclerotic heart disease of native coronary artery without angina pectoris: Secondary | ICD-10-CM | POA: Diagnosis not present

## 2021-01-15 DIAGNOSIS — D509 Iron deficiency anemia, unspecified: Secondary | ICD-10-CM

## 2021-01-15 DIAGNOSIS — I480 Paroxysmal atrial fibrillation: Secondary | ICD-10-CM | POA: Diagnosis not present

## 2021-01-15 DIAGNOSIS — D62 Acute posthemorrhagic anemia: Secondary | ICD-10-CM

## 2021-01-15 DIAGNOSIS — I340 Nonrheumatic mitral (valve) insufficiency: Secondary | ICD-10-CM

## 2021-01-15 DIAGNOSIS — I255 Ischemic cardiomyopathy: Secondary | ICD-10-CM

## 2021-01-15 LAB — BASIC METABOLIC PANEL
Anion gap: 7 (ref 5–15)
BUN: 17 mg/dL (ref 6–20)
CO2: 27 mmol/L (ref 22–32)
Calcium: 8.8 mg/dL — ABNORMAL LOW (ref 8.9–10.3)
Chloride: 106 mmol/L (ref 98–111)
Creatinine, Ser: 1.02 mg/dL — ABNORMAL HIGH (ref 0.44–1.00)
GFR, Estimated: >60 mL/min (ref >60)
Glucose, Bld: 149 mg/dL — ABNORMAL HIGH (ref 70–99)
Potassium: 3.7 mmol/L (ref 3.5–5.1)
Sodium: 140 mmol/L (ref 135–145)

## 2021-01-15 LAB — CBC
HCT: 37.2 % (ref 36.0–46.0)
Hemoglobin: 11.9 g/dL — ABNORMAL LOW (ref 12.0–15.0)
MCH: 29.4 pg (ref 26.0–34.0)
MCHC: 32 g/dL (ref 30.0–36.0)
MCV: 91.9 fL (ref 80.0–100.0)
Platelets: 190 10*3/uL (ref 150–400)
RBC: 4.05 MIL/uL (ref 3.87–5.11)
RDW: 13.4 % (ref 11.5–15.5)
WBC: 5.2 10*3/uL (ref 4.0–10.5)
nRBC: 0 % (ref 0.0–0.2)

## 2021-01-15 NOTE — Progress Notes (Signed)
Electrophysiology Office Note:    Date:  01/15/2021   ID:  Catherine Mays, DOB [birth year], MRN [id number]  PCP:  Celene Squibb, MD  [facility] Cardiologist:  Lauree Chandler, MD  [facility] Electrophysiologist:  Will Meredith Leeds, MD   Referring MD: Celene Squibb, MD   Chief Complaint: Atrial fibrillation/watchman consult  History of Present Illness:    Catherine Mays is a 55 y.o. female who presents for an evaluation of atrial fibrillation at the request of Dr. Nevada Crane. Their medical history includes chronic systolic heart failure secondary to ischemic cardiomyopathy.  She has a history of an anterior STEMI.  She has a ICD in situ that was placed by Dr. Curt Bears.  The patient was last seen by Dr. Aundra Dubin in the heart failure clinic on December 28, 2020.  She was last seen by Dr. Curt Bears February 23, 2020. She was last seen by Dr. Aundra Dubin on December 28, 2020.  She also has a history of frequent PVCs on device interrogation with a burden of over 40% at 1 point.  She underwent a PVC ablation which was not successful so she was started on sotalol.  She has a history of significant anemia requiring transfusion.  She was recently admitted in October of this year with symptomatic anemia requiring transfusion.  EGD and colonoscopy were unrevealing.  Given her history of recurrent GI bleeding, the patient has a double-balloon enteroscopy scheduled at [facility] for December 20.  She presents today to discuss the watchman implant as a mechanism to avoid long-term exposure to anticoagulation.  She is with her husband today in clinic.      Past Medical History:  Diagnosis Date   AICD (automatic cardioverter/defibrillator) present    St. Jude   CAD in native artery    a. late recognition of presentation of STEMI 08/2015 s/p DES to LAD, mild residual mRCA.   Chronic systolic CHF (congestive heart failure) (HCC)    CKD (chronic kidney disease), stage III (HCC)    Hypotension    a.  preventing med titration for HF.   Hypothyroidism 09/24/2015   Ischemic cardiomyopathy    Myocardial infarction [facility]) 08/2015   PAF (paroxysmal atrial fibrillation) ([place]) 09/24/2015   a. dx at time of STEMI 08/2015.   Pre-diabetes    Presence of permanent cardiac pacemaker    patient has ICD   PVC's (premature ventricular contractions)     Past Surgical History:  Procedure Laterality Date   BIOPSY  06/16/2019   Procedure: BIOPSY;  Surgeon: Daneil Dolin, MD;  [facility]: [facility];  Service: Endoscopy;;   CARDIAC CATHETERIZATION N/A 09/20/2015   Procedure: Left Heart Cath and Coronary Angiography;  Surgeon: Burnell Blanks, MD;  Location: [place] CV LAB;  Service: Cardiovascular;  Laterality: N/A;   CARDIAC CATHETERIZATION N/A 09/20/2015   Procedure: Coronary Stent Intervention;  Surgeon: Burnell Blanks, MD;  Location: [place] CV LAB;  Service: Cardiovascular;  Laterality: N/A;   CARDIAC CATHETERIZATION N/A 09/21/2015   Procedure: Left Heart Cath and Coronary Angiography;  Surgeon: Sherren Mocha, MD;  Location: [place] CV LAB;  Service: Cardiovascular;  Laterality: N/A;   CARDIAC CATHETERIZATION N/A 11/26/2015   Procedure: Right Heart Cath;  Surgeon: Larey Dresser, MD;  Location: [place] CV LAB;  Service: Cardiovascular;  Laterality: N/A;   COLONOSCOPY WITH PROPOFOL N/A 11/23/2017   Dr. Gala Romney: Diverticulosis, internal hemorrhoids, next colonoscopy in 10 years   COLONOSCOPY WITH PROPOFOL N/A 08/10/2019   Procedure: COLONOSCOPY WITH  PROPOFOL;  Surgeon: Corbin Ade, MD;  Diverticulosis in sigmoid colon, nonbleeding internal hemorrhoids, otherwise normal exam.     COLONOSCOPY WITH PROPOFOL N/A 12/02/2020   Procedure: COLONOSCOPY WITH PROPOFOL;  Surgeon: Jeani Hawking, MD;  Location: [facility] ENDOSCOPY;  Service: Endoscopy;  Laterality: N/A;   CORONARY STENT PLACEMENT  09/20/2015   EP IMPLANTABLE DEVICE N/A 01/23/2016   Procedure: ICD Implant;  Surgeon: Will  Jorja Loa, MD;  Location: [facility] INVASIVE CV LAB;  Service: Cardiovascular;  Laterality: N/A;   ESOPHAGOGASTRODUODENOSCOPY (EGD) WITH PROPOFOL N/A 06/16/2019   Procedure: ESOPHAGOGASTRODUODENOSCOPY (EGD) WITH PROPOFOL;  Surgeon: Corbin Ade, MD;  Normal esophagus, small hiatal hernia, normal examined stomach, normal examined duodenum.  Gastric biopsy with slight chronic inflammation, duodenal biopsy with slight intramucosal Brunner gland hyperplasia.   ESOPHAGOGASTRODUODENOSCOPY (EGD) WITH PROPOFOL N/A 11/30/2020   Procedure: ESOPHAGOGASTRODUODENOSCOPY (EGD) WITH PROPOFOL;  Surgeon: Jenel Lucks, MD;  Location: [facility] ENDOSCOPY;  Service: Gastroenterology;  Laterality: N/A;   GIVENS CAPSULE STUDY N/A 11/23/2019    Surgeon: Corbin Ade, MD; essentially unremarkable except for tiny erosion in the proximal small bowel.   GIVENS CAPSULE STUDY  11/30/2020   Procedure: GIVENS CAPSULE STUDY;  Surgeon: Jenel Lucks, MD;  Location: [facility] ENDOSCOPY;  Service: Gastroenterology;;   LEFT HEART CATH AND CORONARY ANGIOGRAPHY N/A 12/18/2017   Procedure: LEFT HEART CATH AND CORONARY ANGIOGRAPHY;  Surgeon: [name], MD;  Location: [facility] INVASIVE CV LAB;  Service: Cardiovascular;  Laterality: N/A;   PVC ABLATION N/A 04/02/2017   Procedure: PVC ABLATION;  Surgeon: Regan Lemming, MD;  Location: [facility] INVASIVE CV LAB;  Service: Cardiovascular;  Laterality: N/A;   TEE WITHOUT CARDIOVERSION N/A 10/08/2016   Procedure: TRANSESOPHAGEAL ECHOCARDIOGRAM (TEE);  Surgeon: [name], MD;  Location: [facility] ENDOSCOPY;  Service: Cardiovascular;  Laterality: N/A;   THYROIDECTOMY      Current Medications: Current Meds  Medication Sig   acetaminophen (TYLENOL) 500 MG tablet Take 1,000 mg by mouth every 6 (six) hours as needed for mild pain.   apixaban (ELIQUIS) 5 MG TABS tablet Take 1 tablet (5 mg total) by mouth 2 (two) times daily.   buPROPion (WELLBUTRIN XL) 300 MG 24 hr tablet Take 300 mg by mouth daily.    Calcium Carb-Cholecalciferol (CALCIUM + D3 PO) Take 1 tablet by mouth every morning.   Coenzyme Q10 (COQ10) 200 MG CAPS Take 200 mg by mouth daily.   dexlansoprazole (DEXILANT) 60 MG capsule Take 1 capsule (60 mg total) by mouth daily.   fexofenadine (ALLEGRA) 180 MG tablet Take 180 mg by mouth daily.   nitroGLYCERIN (NITROSTAT) 0.4 MG SL tablet PLACE 1 TABLET UNDER THE TONGUE EVERY 5 MINUTES AS NEEDED FOR CHEST PAIN   polyethylene glycol-electrolytes (NULYTELY) 420 g solution Take by mouth.   rosuvastatin (CRESTOR) 10 MG tablet TAKE 1 TABLET(10 MG) BY MOUTH DAILY   sotalol (BETAPACE) 160 MG tablet TAKE 1 TABLET BY MOUTH EVERY MORNING AND 1/2 TABLET EVERY EVENING   spironolactone (ALDACTONE) 25 MG tablet Take 0.5 tablets (12.5 mg total) by mouth daily. START 10/12   thyroid (ARMOUR) 120 MG tablet Take 120 mg by mouth daily before breakfast.   traZODone (DESYREL) 50 MG tablet Take 0.5 tablets (25 mg total) by mouth at bedtime as needed for sleep.   triamcinolone (NASACORT) 55 MCG/ACT AERO nasal inhaler Place 2 sprays into the nose daily.      Allergies:   Paxil [paroxetine], Morphine and related, Percocet [oxycodone-acetaminophen], Cefdinir, and Zolpidem   Social  History   Socioeconomic History   Marital status: Married    Spouse name: Not on file   Number of children: Not on file   Years of education: Not on file   Highest education level: Not on file  Occupational History   Not on file  Tobacco Use   Smoking status: Never   Smokeless tobacco: Never  Vaping Use   Vaping Use: Never used  Substance and Sexual Activity   Alcohol use: No   Drug use: No   Sexual activity: Not on file  Other Topics Concern   Not on file  Social History Narrative   Not on file   Social Determinants of Health   Financial Resource Strain: Low Risk    Difficulty of Paying Living Expenses: Not hard at all  Food Insecurity: No Food Insecurity   Worried About Running Out of Food in the Last Year:  Never true   [place] in the Last Year: Never true  Transportation Needs: No Transportation Needs   Lack of Transportation (Medical): No   Lack of Transportation (Non-Medical): No  Physical Activity: Insufficiently Active   Days of Exercise per Week: 3 days   Minutes of Exercise per Session: 30 min  Stress: No Stress Concern Present   Feeling of Stress : Not at all  Social Connections: Socially Integrated   Frequency of Communication with Friends and Family: More than three times a week   Frequency of Social Gatherings with Friends and Family: More than three times a week   Attends Religious Services: More than 4 times per year   Active Member of [employer] or Organizations: Yes   Attends Music therapist: More than 4 times per year   Marital Status: Married     Family History: The patient's family history includes Arrhythmia in her mother; Heart attack in her father; Lung cancer in her mother. There is no history of Colon cancer or Colon polyps.  ROS:   Please see the history of present illness.    All other systems reviewed and are negative.  EKGs/Labs/Other Studies Reviewed:    The following studies were reviewed today:  Prior records  March 23, 2020 echo Left ventricular function severely decreased, 25% Regional wall motion abnormalities RV function is normal Mild to moderately dilated left atrium Mild to moderate MR   EKG:  The ekg ordered today demonstrates sinus rhythm, poor R wave progression, left axis deviation.   Recent Labs: 02/10/2020: TSH 0.415 11/30/2020: Magnesium 1.9 12/04/2020: ALT 10 12/28/2020: B Natriuretic Peptide 346.7 01/15/2021: BUN 17; Creatinine, Ser 1.02; Hemoglobin 11.9; Platelets 190; Potassium 3.7; Sodium 140  Recent Lipid Panel    Component Value Date/Time   CHOL 124 12/21/2019 0947   TRIG 105 12/21/2019 0947   HDL 50 12/21/2019 0947   CHOLHDL 2.5 12/21/2019 0947   VLDL 21 12/21/2019 0947   LDLCALC 53 12/21/2019  0947    Physical Exam:    VS:  BP 126/72   Pulse 63   Ht 5\' 9"  (1.753 m)   Wt 151 lb (68.5 kg)   SpO2 98%   BMI 22.30 kg/m     Wt Readings from Last 3 Encounters:  01/15/21 151 lb (68.5 kg)  12/28/20 151 lb 12.8 oz (68.9 kg)  12/14/20 152 lb 6.4 oz (69.1 kg)     GEN:  Well nourished, well developed in no acute distress HEENT: Normal NECK: No JVD; No carotid bruits LYMPHATICS: No lymphadenopathy CARDIAC: RRR, no murmurs,  rubs, gallops.  ICD pocket well-healed RESPIRATORY:  Clear to auscultation without rales, wheezing or rhonchi  ABDOMEN: Soft, non-tender, non-distended MUSCULOSKELETAL:  No edema; No deformity  SKIN: Warm and dry NEUROLOGIC:  Alert and oriented x 3 PSYCHIATRIC:  Normal affect       ASSESSMENT:    1. PAF (paroxysmal atrial fibrillation) ([place])   2. CAD in native artery   3. Chronic systolic CHF (congestive heart failure) ([place])   4. Ischemic cardiomyopathy   5. Mitral valve insufficiency, unspecified etiology   6. Iron deficiency anemia, unspecified iron deficiency anemia type   7. Acute on chronic blood loss anemia    PLAN:    In order of problems listed above:  1. PAF (paroxysmal atrial fibrillation) (HCC) Low burden.  On Eliquis for stroke prophylaxis.  She is very interested in avoiding long-term exposure to anticoagulation given history of recurrent GI bleeding.  She has an upcoming balloon enteroscopy scheduled at [facility] in December.  We discussed the watchman procedure in detail during today's visit including the risks, recovery and need for short-term anticoagulation.  I would like to touch base with her after her DBE to review the findings of the study and to ensure that left atrial appendage closure is still indicated.  We will plan to touch base in early January to accomplish this.  At that appointment we will plan to review the procedure again and potentially get her scheduled for closure of the left atrial appendage.  2. CAD in native  artery No ischemic symptoms today.  3. Chronic systolic CHF (congestive heart failure) (HCC) NYHA class II today.  Followed by the heart failure clinic here.  4. Ischemic cardiomyopathy See #3  5. Mitral valve insufficiency, unspecified etiology See #3  6. Iron deficiency anemia, unspecified iron deficiency anemia type Watchman evaluation as above  7. Acute on chronic blood loss anemia Watchman evaluation as above   Follow-up with me in January to discuss results of DBE and to determine timing of watchman implant.  Total time spent with patient today 45 minutes. This includes reviewing records, evaluating the patient and coordinating care.  Medication Adjustments/Labs and Tests Ordered: Current medicines are reviewed at length with the patient today.  Concerns regarding medicines are outlined above.  Orders Placed This Encounter  Procedures   EKG 12-Lead   No orders of the defined types were placed in this encounter.    Signed, Hilton Cork. Quentin Ore, MD, [facility], [facility] 01/15/2021 8:07 PM    Electrophysiology [facility]

## 2021-01-15 NOTE — Patient Instructions (Signed)
Medication Instructions:  Your physician recommends that you continue on your current medications as directed. Please refer to the Current Medication list given to you today. *If you need a refill on your cardiac medications before your next appointment, please call your pharmacy*  Lab Work: None ordered. If you have labs (blood work) drawn today and your tests are completely normal, you will receive your results only by: MyChart Message (if you have MyChart) OR A paper copy in the mail If you have any lab test that is abnormal or we need to change your treatment, we will call you to review the results.  Testing/Procedures: None ordered.  Follow-Up: At [facility], you and your health needs are our priority.  As part of our continuing mission to provide you with exceptional heart care, we have created designated Provider Care Teams.  These Care Teams include your primary Cardiologist (physician) and Advanced Practice Providers (APPs -  Physician Assistants and Nurse Practitioners) who all work together to provide you with the care you need, when you need it.  Your next appointment:   Your physician wants you to follow-up in: January 2023 with Lanier Prude, MD

## 2021-01-22 ENCOUNTER — Other Ambulatory Visit (HOSPITAL_COMMUNITY): Payer: Self-pay | Admitting: Cardiology

## 2021-01-29 ENCOUNTER — Ambulatory Visit (INDEPENDENT_AMBULATORY_CARE_PROVIDER_SITE_OTHER): Payer: BC Managed Care – PPO | Admitting: Cardiology

## 2021-01-29 ENCOUNTER — Other Ambulatory Visit: Payer: Self-pay

## 2021-01-29 ENCOUNTER — Encounter: Payer: Self-pay | Admitting: Cardiology

## 2021-01-29 VITALS — BP 102/66 | HR 64 | Ht 69.0 in | Wt 138.6 lb

## 2021-01-29 DIAGNOSIS — I255 Ischemic cardiomyopathy: Secondary | ICD-10-CM | POA: Diagnosis not present

## 2021-01-29 NOTE — Progress Notes (Signed)
Electrophysiology Office Note   Date:  01/29/2021   ID:  Catherine Mays, DOB 1965/10/01, MRN [id number]  PCP:  Celene Squibb, MD  Cardiologist:  Rubye Beach Primary Electrophysiologist:  Constance Haw, MD    No chief complaint on file.    History of Present Illness: Catherine Mays is a 55 y.o. female who presents today for electrophysiology evaluation.     She has a history sooner for coronary artery disease status post STEMI in 2017 with drug-eluting stent to the mid LAD, ischemic cardiomyopathy, chronic systolic heart failure, paroxysmal atrial fibrillation found at the time of STEMI.  She presented to [facility] with chest pain and was sent home.  She came back later that night with chest pain was found to have anterior ST elevations.  She was emergently transferred to [facility] and had an occluded mid LAD.  She had drug-eluting stent x1 on 09/20/2015.  She was discharged with a LifeVest.  She was started on amiodarone for atrial fibrillation.  Amiodarone was stopped due to poor diet and weight loss.  She has Tina ICD implanted 01/23/2016.  She had a high burden of PVCs found on Holter monitor at 41%.  She is status post PVC ablation.  She had a decrease in her PVC burden.  She is now on sotalol.  Today, denies symptoms of palpitations, chest pain, shortness of breath, orthopnea, PND, lower extremity edema, claudication, dizziness, presyncope, syncope, bleeding, or neurologic sequela. The patient is tolerating medications without difficulties.  She is doing well today.  Unfortunately she had a GI bleed recently.  She has plans to go to [facility] later this month for a double-balloon endoscopy.  Otherwise, she has no major complaints.  She has been worked up for [employer] with continued work-up after her endoscopy.  Past Medical History:  Diagnosis Date   AICD (automatic cardioverter/defibrillator) present    St. Jude   CAD in native artery    a. late recognition of  presentation of STEMI 08/2015 s/p DES to LAD, mild residual mRCA.   Chronic systolic CHF (congestive heart failure) (HCC)    CKD (chronic kidney disease), stage III (HCC)    Hypotension    a. preventing med titration for HF.   Hypothyroidism 09/24/2015   Ischemic cardiomyopathy    Myocardial infarction [facility]) 08/2015   PAF (paroxysmal atrial fibrillation) ([place]) 09/24/2015   a. dx at time of STEMI 08/2015.   Pre-diabetes    Presence of permanent cardiac pacemaker    patient has ICD   PVC's (premature ventricular contractions)    Past Surgical History:  Procedure Laterality Date   BIOPSY  06/16/2019   Procedure: BIOPSY;  Surgeon: Daneil Dolin, MD;  [facility];  Service: Endoscopy;;   CARDIAC CATHETERIZATION N/A 09/20/2015   Procedure: Left Heart Cath and Coronary Angiography;  Surgeon: Burnell Blanks, MD;  Location: [place] CV LAB;  Service: Cardiovascular;  Laterality: N/A;   CARDIAC CATHETERIZATION N/A 09/20/2015   Procedure: Coronary Stent Intervention;  Surgeon: Burnell Blanks, MD;  Location: [place] CV LAB;  Service: Cardiovascular;  Laterality: N/A;   CARDIAC CATHETERIZATION N/A 09/21/2015   Procedure: Left Heart Cath and Coronary Angiography;  Surgeon: Sherren Mocha, MD;  Location: [place] CV LAB;  Service: Cardiovascular;  Laterality: N/A;   CARDIAC CATHETERIZATION N/A 11/26/2015   Procedure: Right Heart Cath;  Surgeon: Larey Dresser, MD;  Location: [place] CV LAB;  Service: Cardiovascular;  Laterality: N/A;  COLONOSCOPY WITH PROPOFOL N/A 11/23/2017   Dr. Gala Romney: Diverticulosis, internal hemorrhoids, next colonoscopy in 10 years   COLONOSCOPY WITH PROPOFOL N/A 08/10/2019   Procedure: COLONOSCOPY WITH PROPOFOL;  Surgeon: Daneil Dolin, MD;  Diverticulosis in sigmoid colon, nonbleeding internal hemorrhoids, otherwise normal exam.     COLONOSCOPY WITH PROPOFOL N/A 12/02/2020   Procedure: COLONOSCOPY WITH PROPOFOL;  Surgeon: Carol Ada,  MD;  Location: [place];  Service: Endoscopy;  Laterality: N/A;   CORONARY STENT PLACEMENT  09/20/2015   EP IMPLANTABLE DEVICE N/A 01/23/2016   Procedure: ICD Implant;  Surgeon: Fraser Busche Meredith Leeds, MD;  Location: [place] CV LAB;  Service: Cardiovascular;  Laterality: N/A;   ESOPHAGOGASTRODUODENOSCOPY (EGD) WITH PROPOFOL N/A 06/16/2019   Procedure: ESOPHAGOGASTRODUODENOSCOPY (EGD) WITH PROPOFOL;  Surgeon: Daneil Dolin, MD;  Normal esophagus, small hiatal hernia, normal examined stomach, normal examined duodenum.  Gastric biopsy with slight chronic inflammation, duodenal biopsy with slight intramucosal Brunner gland hyperplasia.   ESOPHAGOGASTRODUODENOSCOPY (EGD) WITH PROPOFOL N/A 11/30/2020   Procedure: ESOPHAGOGASTRODUODENOSCOPY (EGD) WITH PROPOFOL;  Surgeon: Daryel November, MD;  Location: [place];  Service: Gastroenterology;  Laterality: N/A;   GIVENS CAPSULE STUDY N/A 11/23/2019    Surgeon: Daneil Dolin, MD; essentially unremarkable except for tiny erosion in the proximal small bowel.   GIVENS CAPSULE STUDY  11/30/2020   Procedure: GIVENS CAPSULE STUDY;  Surgeon: Daryel November, MD;  Location: [facility] ENDOSCOPY;  Service: Gastroenterology;;   LEFT HEART CATH AND CORONARY ANGIOGRAPHY N/A 12/18/2017   Procedure: LEFT HEART CATH AND CORONARY ANGIOGRAPHY;  Surgeon: Larey Dresser, MD;  Location: [place] CV LAB;  Service: Cardiovascular;  Laterality: N/A;   PVC ABLATION N/A 04/02/2017   Procedure: PVC ABLATION;  Surgeon: Constance Haw, MD;  Location: [place] CV LAB;  Service: Cardiovascular;  Laterality: N/A;   TEE WITHOUT CARDIOVERSION N/A 10/08/2016   Procedure: TRANSESOPHAGEAL ECHOCARDIOGRAM (TEE);  Surgeon: Larey Dresser, MD;  Location: [facility] ENDOSCOPY;  Service: Cardiovascular;  Laterality: N/A;   THYROIDECTOMY       Current Outpatient Medications  Medication Sig Dispense Refill   acetaminophen (TYLENOL) 500 MG tablet Take 1,000 mg by mouth every 6 (six)  hours as needed for mild pain.     apixaban (ELIQUIS) 5 MG TABS tablet Take 1 tablet (5 mg total) by mouth 2 (two) times daily. 180 tablet 3   buPROPion (WELLBUTRIN XL) 300 MG 24 hr tablet Take 300 mg by mouth daily.     Calcium Carb-Cholecalciferol (CALCIUM + D3 PO) Take 1 tablet by mouth every morning.     Coenzyme Q10 (COQ10) 200 MG CAPS Take 200 mg by mouth daily.     dexlansoprazole (DEXILANT) 60 MG capsule Take 1 capsule (60 mg total) by mouth daily. 90 capsule 3   fexofenadine (ALLEGRA) 180 MG tablet Take 180 mg by mouth daily.     nitroGLYCERIN (NITROSTAT) 0.4 MG SL tablet PLACE 1 TABLET UNDER THE TONGUE EVERY 5 MINUTES AS NEEDED FOR CHEST PAIN 25 tablet 3   polyethylene glycol-electrolytes (NULYTELY) 420 g solution Take by mouth.     rosuvastatin (CRESTOR) 10 MG tablet TAKE 1 TABLET(10 MG) BY MOUTH DAILY 90 tablet 3   sotalol (BETAPACE) 160 MG tablet TAKE 1 TABLET BY MOUTH EVERY MORNING AND 1/2 TABLET EVERY EVENING 45 tablet 6   spironolactone (ALDACTONE) 25 MG tablet Take 0.5 tablets (12.5 mg total) by mouth daily. 45 tablet 2   thyroid (ARMOUR) 120 MG tablet Take 120 mg by mouth daily before  breakfast.     traZODone (DESYREL) 50 MG tablet Take 0.5 tablets (25 mg total) by mouth at bedtime as needed for sleep. 30 tablet 2   triamcinolone (NASACORT) 55 MCG/ACT AERO nasal inhaler Place 2 sprays into the nose daily.      No current facility-administered medications for this visit.    Allergies:   Paxil [paroxetine], Morphine and related, Percocet [oxycodone-acetaminophen], Cefdinir, and Zolpidem   Social History:  The patient  reports that she has never smoked. She has never used smokeless tobacco. She reports that she does not drink alcohol and does not use drugs.   Family History:  The patient's family history includes Arrhythmia in her mother; Heart attack in her father; Lung cancer in her mother.   ROS:  Please see the history of present illness.   Otherwise, review of systems is  positive for none.   All other systems are reviewed and negative.   PHYSICAL EXAM: VS:  BP 102/66   Pulse 64   Ht 5\' 9"  (1.753 m)   Wt 138 lb 9.6 oz (62.9 kg)   SpO2 94%   BMI 20.47 kg/m  , BMI Body mass index is 20.47 kg/m. GEN: Well nourished, well developed, in no acute distress  HEENT: normal  Neck: no JVD, carotid bruits, or masses Cardiac: RRR; no murmurs, rubs, or gallops,no edema  Respiratory:  clear to auscultation bilaterally, normal work of breathing GI: soft, nontender, nondistended, + BS MS: no deformity or atrophy  Skin: warm and dry, device site well healed Neuro:  Strength and sensation are intact Psych: euthymic mood, full affect  EKG:  EKG is not ordered today. Personal review of the ekg ordered 12/14/20 shows sinus rhythm, rate 59, left bundle branch block  Personal review of the device interrogation today. Results in [place]: 02/10/2020: TSH 0.415 11/30/2020: Magnesium 1.9 12/04/2020: ALT 10 12/28/2020: B Natriuretic Peptide 346.7 01/15/2021: BUN 17; Creatinine, Ser 1.02; Hemoglobin 11.9; Platelets 190; Potassium 3.7; Sodium 140    Lipid Panel     Component Value Date/Time   CHOL 124 12/21/2019 0947   TRIG 105 12/21/2019 0947   HDL 50 12/21/2019 0947   CHOLHDL 2.5 12/21/2019 0947   VLDL 21 12/21/2019 0947   LDLCALC 53 12/21/2019 0947     Wt Readings from Last 3 Encounters:  01/29/21 138 lb 9.6 oz (62.9 kg)  01/15/21 151 lb (68.5 kg)  12/28/20 151 lb 12.8 oz (68.9 kg)      Other studies Reviewed: Additional studies/ records that were reviewed today include: TEE 10/05/17 Review of the above records today demonstrates:  - Left ventricle: The cavity size was mildly dilated. Wall   thickness was normal. The estimated ejection fraction was 30%.   Akinesis of the inferoseptum and anteroseptum. Apical inferior   and apical anterior akinesis. Akinesis of the apex. Features are   consistent with a pseudonormal left ventricular filling  pattern,   with concomitant abnormal relaxation and increased filling   pressure (grade 2 diastolic dysfunction). - Aortic valve: There was no stenosis. - Mitral valve: There was moderate regurgitation. - Left atrium: The atrium was moderately dilated. - Right ventricle: The cavity size was normal. Pacer wire or   catheter noted in right ventricle. Systolic function was normal. - Tricuspid valve: Peak RV-RA gradient (S): 34 mm Hg. - Pulmonary arteries: PA peak pressure: 37 mm Hg (S). - Inferior vena cava: The vessel was normal in size. The   respirophasic diameter changes were  in the normal range (>= 50%),   consistent with normal central venous pressure.  Holter 01/26/17 - personally reviewed Minimum HR: 58 BPM at 4:28:50 AM(2) Maximum HR: 113 BPM at 10:19:38 AM Average HR: 86 BPM Ventricular Beats: 100073 (41%) Sinus rhythm with PVCs and atrial runs   LHC 12/18/17 No obstructive coronary disease, patent LAD stent.   ASSESSMENT AND PLAN:  1.  Coronary artery disease: No current chest pain.  Continue current management.  2.  Paroxysmal atrial fibrillation: Currently on Eliquis.  CHA2DS2-VASc of 3.  Currently being evaluated for watchman.  3.  Ischemic cardiomyopathy: Status post [facility] Jude ICD.  Device functioning appropriately.  No changes at this time.  4.  Nonrheumatic mitral regurgitation: Likely functional due to ischemic cardiomyopathy.  No changes.  5.  PVCs: Holter monitor with a 41% burden.  Ablation in the posterior medial papillary muscle with decrease in burden.  Currently on sotalol.  High risk medication monitoring via ECG and labs.  No changes at this time.  Current medicines are reviewed at length with the patient today.   The patient does not have concerns regarding her medicines.  The following changes were made today: none  Labs/ tests ordered today include:  No orders of the defined types were placed in this encounter.    Disposition:   FU with Stevin Bielinski 12 months  Signed, Luciann Gossett Jorja Loa, MD  01/29/2021 10:23 AM     [facility] [address] [place] [address] [phone] (office) [phone] (fax)

## 2021-01-30 ENCOUNTER — Institutional Professional Consult (permissible substitution): Payer: BC Managed Care – PPO | Admitting: Cardiology

## 2021-02-12 ENCOUNTER — Encounter (HOSPITAL_COMMUNITY): Payer: Self-pay | Admitting: *Deleted

## 2021-02-12 NOTE — Progress Notes (Unsigned)
Pt's disability forms completed w/est rtw of 04/30/21, forms signed by Dr Shirlee Latch and faxed back to [employer] at [phone]

## 2021-03-05 ENCOUNTER — Encounter: Payer: Self-pay | Admitting: Cardiology

## 2021-03-05 ENCOUNTER — Ambulatory Visit: Payer: BC Managed Care – PPO | Admitting: Cardiology

## 2021-03-05 ENCOUNTER — Other Ambulatory Visit: Payer: Self-pay

## 2021-03-05 VITALS — BP 120/70 | HR 51 | Ht 69.0 in | Wt 154.8 lb

## 2021-03-05 DIAGNOSIS — I2510 Atherosclerotic heart disease of native coronary artery without angina pectoris: Secondary | ICD-10-CM | POA: Diagnosis not present

## 2021-03-05 DIAGNOSIS — Z79899 Other long term (current) drug therapy: Secondary | ICD-10-CM

## 2021-03-05 DIAGNOSIS — D649 Anemia, unspecified: Secondary | ICD-10-CM

## 2021-03-05 DIAGNOSIS — I480 Paroxysmal atrial fibrillation: Secondary | ICD-10-CM | POA: Diagnosis not present

## 2021-03-05 DIAGNOSIS — I5022 Chronic systolic (congestive) heart failure: Secondary | ICD-10-CM | POA: Diagnosis not present

## 2021-03-05 NOTE — Patient Instructions (Signed)
Medication Instructions:  Your physician recommends that you continue on your current medications as directed. Please refer to the Current Medication list given to you today. *If you need a refill on your cardiac medications before your next appointment, please call your pharmacy*  Lab Work: None. If you have labs (blood work) drawn today and your tests are completely normal, you will receive your results only by: MyChart Message (if you have MyChart) OR A paper copy in the mail If you have any lab test that is abnormal or we need to change your treatment, we will call you to review the results.  Testing/Procedures: None.  Follow-Up: At [facility], you and your health needs are our priority.  As part of our continuing mission to provide you with exceptional heart care, we have created designated Provider Care Teams.  These Care Teams include your primary Cardiologist (physician) and Advanced Practice Providers (APPs -  Physician Assistants and Nurse Practitioners) who all work together to provide you with the care you need, when you need it.  Your physician wants you to follow-up in: As needed with Cameron Lambert, MD   We recommend signing up for the patient portal called "MyChart".  Sign up information is provided on this After Visit Summary.  MyChart is used to connect with patients for Virtual Visits (Telemedicine).  Patients are able to view lab/test results, encounter notes, upcoming appointments, etc.  Non-urgent messages can be sent to your provider as well.   To learn more about what you can do with MyChart, go to [email or web address].    Any Other Special Instructions Will Be Listed Below (If Applicable).         

## 2021-03-05 NOTE — Progress Notes (Signed)
Electrophysiology Office Follow up Visit Note:    Date:  03/05/2021   ID:  Catherine Mays, DOB 18-Jan-1966, MRN [id number]  PCP:  Celene Squibb, MD  [facility] Cardiologist:  Lauree Chandler, MD  [place] Electrophysiologist:  Will Meredith Leeds, MD    Interval History:    Catherine Mays is a 56 y.o. female who presents for a follow up visit. They were last seen in clinic 01/15/2021 to discuss watchman implant. She had a double-balloon enteroscopy scheduled at [facility] for December 20.  This procedure showed no readily identifiable cause to her previous GI bleeding episodes.  She is currently taking her Eliquis with a stable hemoglobin.  She is with her husband in clinic who I previously met.       Past Medical History:  Diagnosis Date   AICD (automatic cardioverter/defibrillator) present    St. Jude   CAD in native artery    a. late recognition of presentation of STEMI 08/2015 s/p DES to LAD, mild residual mRCA.   Chronic systolic CHF (congestive heart failure) (HCC)    CKD (chronic kidney disease), stage III (HCC)    Hypotension    a. preventing med titration for HF.   Hypothyroidism 09/24/2015   Ischemic cardiomyopathy    Myocardial infarction [facility]) 08/2015   PAF (paroxysmal atrial fibrillation) ([place]) 09/24/2015   a. dx at time of STEMI 08/2015.   Pre-diabetes    Presence of permanent cardiac pacemaker    patient has ICD   PVC's (premature ventricular contractions)     Past Surgical History:  Procedure Laterality Date   BIOPSY  06/16/2019   Procedure: BIOPSY;  Surgeon: Daneil Dolin, MD;  [facility];  Service: Endoscopy;;   CARDIAC CATHETERIZATION N/A 09/20/2015   Procedure: Left Heart Cath and Coronary Angiography;  Surgeon: Burnell Blanks, MD;  Location: [place] CV LAB;  Service: Cardiovascular;  Laterality: N/A;   CARDIAC CATHETERIZATION N/A 09/20/2015   Procedure: Coronary Stent Intervention;  Surgeon: Burnell Blanks, MD;   Location: [place] CV LAB;  Service: Cardiovascular;  Laterality: N/A;   CARDIAC CATHETERIZATION N/A 09/21/2015   Procedure: Left Heart Cath and Coronary Angiography;  Surgeon: Sherren Mocha, MD;  Location: [place] CV LAB;  Service: Cardiovascular;  Laterality: N/A;   CARDIAC CATHETERIZATION N/A 11/26/2015   Procedure: Right Heart Cath;  Surgeon: Larey Dresser, MD;  Location: [place] CV LAB;  Service: Cardiovascular;  Laterality: N/A;   COLONOSCOPY WITH PROPOFOL N/A 11/23/2017   Dr. Gala Romney: Diverticulosis, internal hemorrhoids, next colonoscopy in 10 years   COLONOSCOPY WITH PROPOFOL N/A 08/10/2019   Procedure: COLONOSCOPY WITH PROPOFOL;  Surgeon: Daneil Dolin, MD;  Diverticulosis in sigmoid colon, nonbleeding internal hemorrhoids, otherwise normal exam.     COLONOSCOPY WITH PROPOFOL N/A 12/02/2020   Procedure: COLONOSCOPY WITH PROPOFOL;  Surgeon: Carol Ada, MD;  Location: [place];  Service: Endoscopy;  Laterality: N/A;   CORONARY STENT PLACEMENT  09/20/2015   EP IMPLANTABLE DEVICE N/A 01/23/2016   Procedure: ICD Implant;  Surgeon: Will Meredith Leeds, MD;  Location: [place] CV LAB;  Service: Cardiovascular;  Laterality: N/A;   ESOPHAGOGASTRODUODENOSCOPY (EGD) WITH PROPOFOL N/A 06/16/2019   Procedure: ESOPHAGOGASTRODUODENOSCOPY (EGD) WITH PROPOFOL;  Surgeon: Daneil Dolin, MD;  Normal esophagus, small hiatal hernia, normal examined stomach, normal examined duodenum.  Gastric biopsy with slight chronic inflammation, duodenal biopsy with slight intramucosal Brunner gland hyperplasia.   ESOPHAGOGASTRODUODENOSCOPY (EGD) WITH PROPOFOL N/A 11/30/2020   Procedure: ESOPHAGOGASTRODUODENOSCOPY (EGD) WITH PROPOFOL;  Surgeon: Daryel November, MD;  Location: [place];  Service: Gastroenterology;  Laterality: N/A;   GIVENS CAPSULE STUDY N/A 11/23/2019    Surgeon: Daneil Dolin, MD; essentially unremarkable except for tiny erosion in the proximal small bowel.   GIVENS CAPSULE  STUDY  11/30/2020   Procedure: GIVENS CAPSULE STUDY;  Surgeon: Daryel November, MD;  Location: [facility] ENDOSCOPY;  Service: Gastroenterology;;   LEFT HEART CATH AND CORONARY ANGIOGRAPHY N/A 12/18/2017   Procedure: LEFT HEART CATH AND CORONARY ANGIOGRAPHY;  Surgeon: Larey Dresser, MD;  Location: [place] CV LAB;  Service: Cardiovascular;  Laterality: N/A;   PVC ABLATION N/A 04/02/2017   Procedure: PVC ABLATION;  Surgeon: Constance Haw, MD;  Location: [place] CV LAB;  Service: Cardiovascular;  Laterality: N/A;   TEE WITHOUT CARDIOVERSION N/A 10/08/2016   Procedure: TRANSESOPHAGEAL ECHOCARDIOGRAM (TEE);  Surgeon: Larey Dresser, MD;  Location: [facility] ENDOSCOPY;  Service: Cardiovascular;  Laterality: N/A;   THYROIDECTOMY      Current Medications: Current Meds  Medication Sig   acetaminophen (TYLENOL) 500 MG tablet Take 1,000 mg by mouth every 6 (six) hours as needed for mild pain.   apixaban (ELIQUIS) 5 MG TABS tablet Take 1 tablet (5 mg total) by mouth 2 (two) times daily.   buPROPion (WELLBUTRIN XL) 300 MG 24 hr tablet Take 300 mg by mouth daily.   Calcium Carb-Cholecalciferol (CALCIUM + D3 PO) Take 1 tablet by mouth every morning.   Coenzyme Q10 (COQ10) 200 MG CAPS Take 200 mg by mouth daily.   dexlansoprazole (DEXILANT) 60 MG capsule Take 1 capsule (60 mg total) by mouth daily.   fexofenadine (ALLEGRA) 180 MG tablet Take 180 mg by mouth daily.   nitroGLYCERIN (NITROSTAT) 0.4 MG SL tablet PLACE 1 TABLET UNDER THE TONGUE EVERY 5 MINUTES AS NEEDED FOR CHEST PAIN   rosuvastatin (CRESTOR) 10 MG tablet TAKE 1 TABLET(10 MG) BY MOUTH DAILY   sotalol (BETAPACE) 160 MG tablet TAKE 1 TABLET BY MOUTH EVERY MORNING AND 1/2 TABLET EVERY EVENING   spironolactone (ALDACTONE) 25 MG tablet Take 0.5 tablets (12.5 mg total) by mouth daily.   thyroid (ARMOUR) 120 MG tablet Take 120 mg by mouth daily before breakfast.   traZODone (DESYREL) 50 MG tablet Take 0.5 tablets (25 mg total) by mouth at bedtime  as needed for sleep.   triamcinolone (NASACORT) 55 MCG/ACT AERO nasal inhaler Place 2 sprays into the nose daily.      Allergies:   Paxil [paroxetine], Morphine and related, Percocet [oxycodone-acetaminophen], Cefdinir, and Zolpidem   Social History   Socioeconomic History   Marital status: Married    Spouse name: Not on file   Number of children: Not on file   Years of education: Not on file   Highest education level: Not on file  Occupational History   Not on file  Tobacco Use   Smoking status: Never   Smokeless tobacco: Never  Vaping Use   Vaping Use: Never used  Substance and Sexual Activity   Alcohol use: No   Drug use: No   Sexual activity: Not on file  Other Topics Concern   Not on file  Social History Narrative   Not on file   Social Determinants of Health   Financial Resource Strain: Not on file  Food Insecurity: Not on file  Transportation Needs: Not on file  Physical Activity: Not on file  Stress: Not on file  Social Connections: Not on file     Family History: The  patient's family history includes Arrhythmia in her mother; Heart attack in her father; Lung cancer in her mother. There is no history of Colon cancer or Colon polyps.  ROS:   Please see the history of present illness.    All other systems reviewed and are negative.  EKGs/Labs/Other Studies Reviewed:    The following studies were reviewed today:  02/12/2021 DBE at [facility] Impression: - The entire examined colon is normal. - The examined portion of the ileum was normal.  Tattooed. - No blood or bleeding source detected. - No specimens collected.      EKG:  The ekg ordered today demonstrates sinus rhythm, left axis deviation, poor R wave progression in the precordial leads.  QTC is 430 ms.  Recent Labs: 11/30/2020: Magnesium 1.9 12/04/2020: ALT 10 12/28/2020: B Natriuretic Peptide 346.7 01/15/2021: BUN 17; Creatinine, Ser 1.02; Hemoglobin 11.9; Platelets 190; Potassium 3.7; Sodium 140   Recent Lipid Panel    Component Value Date/Time   CHOL 124 12/21/2019 0947   TRIG 105 12/21/2019 0947   HDL 50 12/21/2019 0947   CHOLHDL 2.5 12/21/2019 0947   VLDL 21 12/21/2019 0947   LDLCALC 53 12/21/2019 0947    Physical Exam:    VS:  BP 120/70    Pulse (!) 51    Ht _0  (1.753 m)    Wt 154 lb 12.8 oz (70.2 kg)    SpO2 97%    BMI 22.86 kg/m     Wt Readings from Last 3 Encounters:  03/05/21 154 lb 12.8 oz (70.2 kg)  01/29/21 138 lb 9.6 oz (62.9 kg)  01/15/21 151 lb (68.5 kg)     GEN:  Well nourished, well developed in no acute distress HEENT: Normal NECK: No JVD; No carotid bruits LYMPHATICS: No lymphadenopathy CARDIAC: RRR, no murmurs, rubs, gallops RESPIRATORY:  Clear to auscultation without rales, wheezing or rhonchi  ABDOMEN: Soft, non-tender, non-distended MUSCULOSKELETAL:  No edema; No deformity  SKIN: Warm and dry NEUROLOGIC:  Alert and oriented x 3 PSYCHIATRIC:  Normal affect        ASSESSMENT:    1. Chronic systolic CHF (congestive heart failure) ([place])   2. PAF (paroxysmal atrial fibrillation) ([place])   3. Coronary artery disease involving native coronary artery of native heart without angina pectoris   4. Symptomatic anemia   5. Encounter for long-term (current) use of high-risk medication    PLAN:    In order of problems listed above:  #Chronic systolic heart failure NYHA class II today.  Warm and dry on exam.  Continue current medical therapy.  Follows with Dr. Aundra Dubin in the heart failure clinic.  #Paroxysmal atrial fibrillation #Symptomatic anemia The patient has had significant trouble with symptomatic anemia in the past.  Her hemoglobin is currently stable around 11.  She recently had work-up at [facility] including a double-balloon enteroscopy which was unrevealing. We discussed the implications of a normal balloon enteroscopy during today's clinic appointment.  Given the history of severe anemia without an identifiable source, I think she is  certainly at risk of having recurrent episodes.  1 strategy would be to continue with a watchful waiting approach while taking Eliquis.  If we pursue this strategy and she were to have a recurrent GI bleed we would not be able to immediately proceed with watchman implant.  This was discussed with the patient.  If this were to happen she would need to recover from the acute anemia and demonstrate stability on Eliquis because of the required short  course of Eliquis after watchman implant.  Another approach would be to proceed now with watchman implant because of the lack of identifiable source of bleeding in the GI tract.  This would help avoid long-term exposure anticoagulation and avoid the associated risks.  Both of these strategies were discussed at length with the patient and her husband during today today's clinic appointment.  They will think over the options and discuss them with Dr. Aundra Dubin at their upcoming appointment.  They will let us know how they would like to proceed.  She will continue to follow-up with Dr. Curt Bears for her routine electrophysiologic care including monitoring of her sotalol.  #Coronary artery disease No ischemic symptoms.  Continue current medical therapy.   Follow-up with me on an as-needed basis.   Medication Adjustments/Labs and Tests Ordered: Current medicines are reviewed at length with the patient today.  Concerns regarding medicines are outlined above.  Orders Placed This Encounter  Procedures   EKG 12-Lead   No orders of the defined types were placed in this encounter.    Signed, Lars Mage, MD, [facility], [facility] 03/05/2021 10:53 PM    Electrophysiology [facility]

## 2021-03-13 ENCOUNTER — Other Ambulatory Visit: Payer: Self-pay | Admitting: Gastroenterology

## 2021-03-18 ENCOUNTER — Ambulatory Visit: Payer: BC Managed Care – PPO | Admitting: Cardiovascular Disease

## 2021-03-18 ENCOUNTER — Other Ambulatory Visit: Payer: Self-pay

## 2021-03-18 ENCOUNTER — Encounter: Payer: Self-pay | Admitting: Cardiovascular Disease

## 2021-03-18 VITALS — BP 82/60 | HR 58 | Ht 69.0 in | Wt 153.0 lb

## 2021-03-18 DIAGNOSIS — I480 Paroxysmal atrial fibrillation: Secondary | ICD-10-CM | POA: Diagnosis not present

## 2021-03-18 DIAGNOSIS — I5022 Chronic systolic (congestive) heart failure: Secondary | ICD-10-CM | POA: Diagnosis not present

## 2021-03-18 DIAGNOSIS — I2510 Atherosclerotic heart disease of native coronary artery without angina pectoris: Secondary | ICD-10-CM

## 2021-03-18 DIAGNOSIS — I255 Ischemic cardiomyopathy: Secondary | ICD-10-CM

## 2021-03-18 DIAGNOSIS — I340 Nonrheumatic mitral (valve) insufficiency: Secondary | ICD-10-CM

## 2021-03-18 NOTE — Patient Instructions (Signed)
Medication Instructions:  °Your physician recommends that you continue on your current medications as directed. Please refer to the Current Medication list given to you today.  °*If you need a refill on your cardiac medications before your next appointment, please call your pharmacy* ° ° °Lab Work: °None °If you have labs (blood work) drawn today and your tests are completely normal, you will receive your results only by: °MyChart Message (if you have MyChart) OR °A paper copy in the mail °If you have any lab test that is abnormal or we need to change your treatment, we will call you to review the results. ° ° °Testing/Procedures: °None ° ° °Follow-Up: °At [facility], you and your health needs are our priority.  As part of our continuing mission to provide you with exceptional heart care, we have created designated Provider Care Teams.  These Care Teams include your primary Cardiologist (physician) and Advanced Practice Providers (APPs -  Physician Assistants and Nurse Practitioners) who all work together to provide you with the care you need, when you need it. ° ° °Your next appointment:   °12 month(s) ° °The format for your next appointment:   °In Person ° °Provider:   °Christopher McAlhany, MD   ° °

## 2021-03-18 NOTE — Progress Notes (Signed)
Chief Complaint  Patient presents with   Follow-up    CAD    History of Present Illness: 56 yo female with history of CAD with anterior STEMI in July 2017, ischemic cardiomyopathy s/p ICD, chronic systolic CHF, paroxysmal atrial fib, severe mitral regurgitation here today for follow up. She presented to the [facility] ED 09/19/15 with chest pain and workup was delayed leading to late recognition of her MI. Cardiac cath 08/2715 with occluded mid LAD treated with a single drug eluting stent. She was found to have severe LV systolic dysfunction with LVEF around 30% immediately post cath. Continued chest pain and relook cath 09/21/15 demonstrated patency of the LAD stent. Her hospitalization was complicated by atrial fibrillation. She has been on Xarelto. She did not tolerate amiodarone. Echo November 2017 with LVEF 30-35%, severe MR. ICD implanted November 2017. She has been followed in the advanced CHF clinic by Dr. Aundra Dubin.TEE August 2018 showed LVEF=30% with moderate MR. She was found to have frequent PVCs on her device and Holter December 2018 showed 41% PVCs. She was started on Sotalol and Coreg was stopped. She had a PVC ablation in February 2019 but the PVCs returned. Echo August 2019 with LVEF=30% with normal RV size and function, moderate MR. She was admitted to [facility] in October 2019 with chest pain. Cardiac cath with patent LAD stent and no obstructive disease in the other vessels. She has had ongoing issues with iron deficiency anemia and GI workup including double-balloon enteroscopy at [facility] December 2022 with no clear source of bleeding. She has been seen by Dr. Quentin Ore in the EP clinic to discuss Watchman implantation. She is tolerating Eliquis. She is followed in the EP clinic by Dr. Lennie Odor and in the [facility] by Dr. Aundra Dubin. Echo January 2022 with LVEF=25-30%, mild to moderate MR.   She is here today for follow up. The patient denies any dyspnea, palpitations, lower  extremity edema, orthopnea, PND, dizziness, near syncope or syncope. She has occasional chest pressure that lasts for several minutes without associated dyspnea. No pattern to this. Can happen at rest or with exertion.   Primary Care Physician: Celene Squibb, MD   Past Medical History:  Diagnosis Date   AICD (automatic cardioverter/defibrillator) present    [employer]   CAD in native artery    a. late recognition of presentation of STEMI 08/2015 s/p DES to LAD, mild residual mRCA.   Chronic systolic CHF (congestive heart failure) (HCC)    CKD (chronic kidney disease), stage III (HCC)    Hypotension    a. preventing med titration for HF.   Hypothyroidism 09/24/2015   Ischemic cardiomyopathy    Myocardial infarction [facility]) 08/2015   PAF (paroxysmal atrial fibrillation) ([place]) 09/24/2015   a. dx at time of STEMI 08/2015.   Pre-diabetes    Presence of permanent cardiac pacemaker    patient has ICD   PVC's (premature ventricular contractions)     Past Surgical History:  Procedure Laterality Date   BIOPSY  06/16/2019   Procedure: BIOPSY;  Surgeon: Daneil Dolin, MD;  [facility];  Service: Endoscopy;;   CARDIAC CATHETERIZATION N/A 09/20/2015   Procedure: Left Heart Cath and Coronary Angiography;  Surgeon: Burnell Blanks, MD;  Location: [place] CV LAB;  Service: Cardiovascular;  Laterality: N/A;   CARDIAC CATHETERIZATION N/A 09/20/2015   Procedure: Coronary Stent Intervention;  Surgeon: Burnell Blanks, MD;  Location: [place] CV LAB;  Service: Cardiovascular;  Laterality: N/A;  CARDIAC CATHETERIZATION N/A 09/21/2015   Procedure: Left Heart Cath and Coronary Angiography;  Surgeon: Sherren Mocha, MD;  Location: [place] CV LAB;  Service: Cardiovascular;  Laterality: N/A;   CARDIAC CATHETERIZATION N/A 11/26/2015   Procedure: Right Heart Cath;  Surgeon: Larey Dresser, MD;  Location: [place] CV LAB;  Service: Cardiovascular;  Laterality: N/A;    COLONOSCOPY WITH PROPOFOL N/A 11/23/2017   Dr. Gala Romney: Diverticulosis, internal hemorrhoids, next colonoscopy in 10 years   COLONOSCOPY WITH PROPOFOL N/A 08/10/2019   Procedure: COLONOSCOPY WITH PROPOFOL;  Surgeon: Daneil Dolin, MD;  Diverticulosis in sigmoid colon, nonbleeding internal hemorrhoids, otherwise normal exam.     COLONOSCOPY WITH PROPOFOL N/A 12/02/2020   Procedure: COLONOSCOPY WITH PROPOFOL;  Surgeon: Carol Ada, MD;  Location: [place];  Service: Endoscopy;  Laterality: N/A;   CORONARY STENT PLACEMENT  09/20/2015   EP IMPLANTABLE DEVICE N/A 01/23/2016   Procedure: ICD Implant;  Surgeon: Will Meredith Leeds, MD;  Location: [place] CV LAB;  Service: Cardiovascular;  Laterality: N/A;   ESOPHAGOGASTRODUODENOSCOPY (EGD) WITH PROPOFOL N/A 06/16/2019   Procedure: ESOPHAGOGASTRODUODENOSCOPY (EGD) WITH PROPOFOL;  Surgeon: Daneil Dolin, MD;  Normal esophagus, small hiatal hernia, normal examined stomach, normal examined duodenum.  Gastric biopsy with slight chronic inflammation, duodenal biopsy with slight intramucosal Brunner gland hyperplasia.   ESOPHAGOGASTRODUODENOSCOPY (EGD) WITH PROPOFOL N/A 11/30/2020   Procedure: ESOPHAGOGASTRODUODENOSCOPY (EGD) WITH PROPOFOL;  Surgeon: Daryel November, MD;  Location: [place];  Service: Gastroenterology;  Laterality: N/A;   GIVENS CAPSULE STUDY N/A 11/23/2019    Surgeon: Daneil Dolin, MD; essentially unremarkable except for tiny erosion in the proximal small bowel.   GIVENS CAPSULE STUDY  11/30/2020   Procedure: GIVENS CAPSULE STUDY;  Surgeon: Daryel November, MD;  Location[facility] END[facility]COPY;  Service: Gastroenterology;;   LEFT HEART CATH AND CORONARY ANGIOGRAPHY N/A 12/18/2017   Procedure: LEFT HEART CATH AND CORONARY ANGIOGRAPHY;  Surgeon: Larey Dresser, MD;  Location: [place] CV LAB;  Service: Cardiovascular;  Laterality: N/A;   PVC ABLATION N/A 04/02/2017   Procedure: PVC ABLATION;  Surgeon: Constance Haw, MD;   Location: [place] CV LAB;  Service: Cardiovascular;  Laterality: N/A;   TEE WITHOUT CARDIOVERSION N/A 10/08/2016   Procedure: TRANSESOPHAGEAL ECHOCARDIOGRAM (TEE);  Surgeon: Larey Dresser, MD;  Location: [facility] ENDOSCOPY;  Service: Cardiovascular;  Laterality: N/A;   THYROIDECTOMY      Home Meds:  Current Meds  Medication Sig   acetaminophen (TYLENOL) 500 MG tablet Take 1,000 mg by mouth every 6 (six) hours as needed for mild pain.   apixaban (ELIQUIS) 5 MG TABS tablet Take 1 tablet (5 mg total) by mouth 2 (two) times daily.   buPROPion (WELLBUTRIN XL) 300 MG 24 hr tablet Take 1 tablet by mouth daily.   Calcium Carb-Cholecalciferol (CALCIUM + D3 PO) Take 1 tablet by mouth every morning.   Coenzyme Q10 (COQ10) 200 MG CAPS Take 200 mg by mouth daily.   dexlansoprazole (DEXILANT) 60 MG capsule TAKE 1 CAPSULE(60 MG) BY MOUTH DAILY   fexofenadine (ALLEGRA) 180 MG tablet Take 180 mg by mouth daily.   furosemide (LASIX) 20 MG tablet Take 1 tablet by mouth every morning.   nitroGLYCERIN (NITROSTAT) 0.4 MG SL tablet PLACE 1 TABLET UNDER THE TONGUE EVERY 5 MINUTES AS NEEDED FOR CHEST PAIN   rosuvastatin (CRESTOR) 10 MG tablet TAKE 1 TABLET(10 MG) BY MOUTH DAILY   sotalol (BETAPACE) 160 MG tablet TAKE 1 TABLET BY MOUTH EVERY MORNING AND 1/2 TABLET  EVERY EVENING   spironolactone (ALDACTONE) 25 MG tablet Take 0.5 tablets (12.5 mg total) by mouth daily.   thyroid (ARMOUR) 120 MG tablet Take 120 mg by mouth daily before breakfast.   traZODone (DESYREL) 50 MG tablet Take 0.5 tablets (25 mg total) by mouth at bedtime as needed for sleep.   triamcinolone (NASACORT) 55 MCG/ACT AERO nasal inhaler Place 2 sprays into the nose daily.    [DISCONTINUED] buPROPion (WELLBUTRIN XL) 300 MG 24 hr tablet Take 300 mg by mouth daily.   [DISCONTINUED] polyethylene glycol-electrolytes (NULYTELY) 420 g solution Take by mouth.    Allergies  Allergen Reactions   Paxil [Paroxetine] Palpitations   Morphine And Related  Nausea And Vomiting   Percocet [Oxycodone-Acetaminophen] Nausea And Vomiting   Cefdinir Nausea Only   Zolpidem Other (See Comments)    Doesn't work for patient at all    Social History   Socioeconomic History   Marital status: Married    Spouse name: Not on file   Number of children: Not on file   Years of education: Not on file   Highest education level: Not on file  Occupational History   Not on file  Tobacco Use   Smoking status: Never   Smokeless tobacco: Never  Vaping Use   Vaping Use: Never used  Substance and Sexual Activity   Alcohol use: No   Drug use: No   Sexual activity: Not on file  Other Topics Concern   Not on file  Social History Narrative   Not on file   Social Determinants of Health   Financial Resource Strain: Not on file  Food Insecurity: Not on file  Transportation Needs: Not on file  Physical Activity: Not on file  Stress: Not on file  Social Connections: Not on file  Intimate Partner Violence: Not on file    Family History  Problem Relation Age of Onset   Heart attack Father    Arrhythmia Mother    Lung cancer Mother        bronchiectasis   Colon cancer Neg Hx    Colon polyps Neg Hx     Review of Systems:  As stated in the HPI and otherwise negative.   BP (!) 82/60    Pulse (!) 58    Ht 5\' 9"  (1.753 m)    Wt 153 lb (69.4 kg)    SpO2 98%    BMI 22.59 kg/m   Physical Examination:  General: Well developed, well nourished, NAD  HEENT: OP clear, mucus membranes moist  SKIN: warm, dry. No rashes. Neuro: No focal deficits  Musculoskeletal: Muscle strength 5/5 all ext  Psychiatric: Mood and affect normal  Neck: No JVD, no carotid bruits, no thyromegaly, no lymphadenopathy.  Lungs:Clear bilaterally, no wheezes, rhonci, crackles Cardiovascular: Regular rate and rhythm. No murmurs, gallops or rubs. Abdomen:Soft. Bowel sounds present. Non-tender.  Extremities: No lower extremity edema. Pulses are 2 + in the bilateral DP/PT.  Echo  January 2022:  1. Left ventricular ejection fraction, by estimation, is 25 to 30%. The  left ventricle has severely decreased function. The left ventricle  demonstrates regional wall motion abnormalities the anteroseptal and  inferoseptal walls are akinetic. The apex is  akinetic. The mid to apical anterior wall is akinetic. The left  ventricular internal cavity size was mildly dilated. Left ventricular  diastolic parameters are consistent with Grade II diastolic dysfunction  (pseudonormalization).   2. Right ventricular systolic function is normal. The right ventricular  size is normal. There  is normal pulmonary artery systolic pressure. The  estimated right ventricular systolic pressure is [id number] mmHg.   3. Left atrial size was mild to moderately dilated.   4. The mitral valve is normal in structure. Mild to moderate mitral valve  regurgitation. No evidence of mitral stenosis.   5. The aortic valve is tricuspid. Aortic valve regurgitation is not  visualized. No aortic stenosis is present.   6. The inferior vena cava is normal in size with greater than 50%  respiratory variability, suggesting right atrial pressure of 3 mmHg.   EKG:  EKG is not ordered today. The ekg ordered today demonstrates   Recent Labs: 11/30/2020: Magnesium 1.9 12/04/2020: ALT 10 12/28/2020: B Natriuretic Peptide 346.7 01/15/2021: BUN 17; Creatinine, Ser 1.02; Hemoglobin 11.9; Platelets 190; Potassium 3.7; Sodium 140   Lipid Panel    Component Value Date/Time   CHOL 124 12/21/2019 0947   TRIG 105 12/21/2019 0947   HDL 50 12/21/2019 0947   CHOLHDL 2.5 12/21/2019 0947   VLDL 21 12/21/2019 0947   LDLCALC 53 12/21/2019 0947     Wt Readings from Last 3 Encounters:  03/18/21 153 lb (69.4 kg)  03/05/21 154 lb 12.8 oz (70.2 kg)  01/29/21 138 lb 9.6 oz (62.9 kg)     Assessment and Plan:    1. CAD without angina: She is known to have CAD with anterior MI in July 2017 with placement of a single DES in the mid  LAD. She had prolonged ischemic time and now has LV dysfunction. Cardiac cath October 2019 with patent LAD stent and no disease in the other vessels. No chest pain concerning for angina. I do not think her occasional chest pressure is cardiac related.She will call if this becomes more severe or more predictable with exertion. No ASA since she is on Eliquis. Continue statin. She is on sotalol for PVCs and Xarelto for PAF. LDL 49 in August 2022.   2. PAF/PVCs: Sinus today. She is followed by Dr. Lennie Odor in the EP clinic. Of note, she did not tolerate amiodarone.  Continue Sotalol and Eliquis.   3. Chronic systolic CHF/Ischemic cardiomyopathy: Weight up 7 lbs over the past year but no volume overload on exam. She admits to dietary changes. LVEF=30% by echo January 2022. ICD in place. She is followed in the advanced heart failure clinic by Dr. Aundra Dubin. Apical akinesis but she is on Elqiuis for PAF.   4. Mitral regurgitation: Mild to moderate by echo January 2022.   Follow up with me in one year  Signed, Lauree Chandler, MD 03/18/2021 4:27 PM    [address], [place], [place]  [address] Phone: [phone]; Fax: [phone]

## 2021-03-19 ENCOUNTER — Ambulatory Visit (INDEPENDENT_AMBULATORY_CARE_PROVIDER_SITE_OTHER): Payer: BC Managed Care – PPO

## 2021-03-19 DIAGNOSIS — I255 Ischemic cardiomyopathy: Secondary | ICD-10-CM

## 2021-03-19 LAB — CUP PACEART REMOTE DEVICE CHECK
Battery Remaining Longevity: 60 mo
Battery Remaining Percentage: 57 %
Battery Voltage: 2.95 V
Brady Statistic RV Percent Paced: 1 %
Date Time Interrogation Session: 20230124044814
HighPow Impedance: 79 Ohm
HighPow Impedance: 79 Ohm
Implantable Lead Implant Date: 20171129
Implantable Lead Location: 753860
Implantable Pulse Generator Implant Date: 20171129
Lead Channel Impedance Value: 550 Ohm
Lead Channel Pacing Threshold Amplitude: 1.25 V
Lead Channel Pacing Threshold Pulse Width: 0.5 ms
Lead Channel Sensing Intrinsic Amplitude: 12 mV
Lead Channel Setting Pacing Amplitude: 2.5 V
Lead Channel Setting Pacing Pulse Width: 0.5 ms
Lead Channel Setting Sensing Sensitivity: 0.5 mV
Pulse Gen Serial Number: 7377983

## 2021-03-29 NOTE — Progress Notes (Signed)
Remote ICD transmission.   

## 2021-04-19 ENCOUNTER — Telehealth: Payer: Self-pay | Admitting: Internal Medicine

## 2021-04-19 MED ORDER — DEXLANSOPRAZOLE 60 MG PO CPDR: DELAYED_RELEASE_CAPSULE | ORAL | 3 refills | Status: DC

## 2021-04-19 NOTE — Telephone Encounter (Signed)
Hi Dr. Leonides Schanz,   Patient called requested a transfer of care request over to you said you did a procedure for her at the hospital and she really liked you would like to remain with you if possible.  Records are in Epic for review.    Please advise on scheduling.   Thanks

## 2021-04-19 NOTE — Telephone Encounter (Signed)
I spoke with patient to advise her of your approval. The patient is asking if she can get a refill on the Dexilant.

## 2021-04-19 NOTE — Telephone Encounter (Signed)
Rx for Dexilant sent to pharmacy as requested.

## 2021-04-24 ENCOUNTER — Other Ambulatory Visit (HOSPITAL_COMMUNITY): Payer: Self-pay

## 2021-04-24 MED ORDER — SOTALOL HCL 160 MG PO TABS: ORAL_TABLET | ORAL | 0 refills | Status: DC

## 2021-04-30 ENCOUNTER — Ambulatory Visit (HOSPITAL_COMMUNITY)
Admission: RE | Admit: 2021-04-30 | Discharge: 2021-04-30 | Disposition: A | Discharge status: Home / Self Care | Payer: BC Managed Care – PPO | Source: Ambulatory Visit | Attending: Cardiology | Admitting: Cardiology

## 2021-04-30 ENCOUNTER — Encounter (HOSPITAL_COMMUNITY): Payer: Self-pay | Admitting: Cardiology

## 2021-04-30 ENCOUNTER — Other Ambulatory Visit: Payer: Self-pay

## 2021-04-30 VITALS — BP 92/60 | HR 53 | Wt 151.8 lb

## 2021-04-30 DIAGNOSIS — I340 Nonrheumatic mitral (valve) insufficiency: Secondary | ICD-10-CM | POA: Insufficient documentation

## 2021-04-30 DIAGNOSIS — I2510 Atherosclerotic heart disease of native coronary artery without angina pectoris: Secondary | ICD-10-CM | POA: Insufficient documentation

## 2021-04-30 DIAGNOSIS — Z79899 Other long term (current) drug therapy: Secondary | ICD-10-CM | POA: Insufficient documentation

## 2021-04-30 DIAGNOSIS — Z95810 Presence of automatic (implantable) cardiac defibrillator: Secondary | ICD-10-CM | POA: Diagnosis not present

## 2021-04-30 DIAGNOSIS — I480 Paroxysmal atrial fibrillation: Secondary | ICD-10-CM | POA: Insufficient documentation

## 2021-04-30 DIAGNOSIS — I255 Ischemic cardiomyopathy: Secondary | ICD-10-CM | POA: Diagnosis not present

## 2021-04-30 DIAGNOSIS — I509 Heart failure, unspecified: Secondary | ICD-10-CM

## 2021-04-30 DIAGNOSIS — I5022 Chronic systolic (congestive) heart failure: Secondary | ICD-10-CM | POA: Insufficient documentation

## 2021-04-30 DIAGNOSIS — I493 Ventricular premature depolarization: Secondary | ICD-10-CM | POA: Insufficient documentation

## 2021-04-30 DIAGNOSIS — E785 Hyperlipidemia, unspecified: Secondary | ICD-10-CM | POA: Diagnosis not present

## 2021-04-30 DIAGNOSIS — I252 Old myocardial infarction: Secondary | ICD-10-CM | POA: Diagnosis not present

## 2021-04-30 DIAGNOSIS — R9431 Abnormal electrocardiogram [ECG] [EKG]: Secondary | ICD-10-CM | POA: Diagnosis not present

## 2021-04-30 DIAGNOSIS — D500 Iron deficiency anemia secondary to blood loss (chronic): Secondary | ICD-10-CM | POA: Insufficient documentation

## 2021-04-30 DIAGNOSIS — Z7901 Long term (current) use of anticoagulants: Secondary | ICD-10-CM | POA: Insufficient documentation

## 2021-04-30 DIAGNOSIS — Z955 Presence of coronary angioplasty implant and graft: Secondary | ICD-10-CM | POA: Diagnosis not present

## 2021-04-30 LAB — CBC
HCT: 40.8 % (ref 36.0–46.0)
Hemoglobin: 13.1 g/dL (ref 12.0–15.0)
MCH: 28.2 pg (ref 26.0–34.0)
MCHC: 32.1 g/dL (ref 30.0–36.0)
MCV: 87.9 fL (ref 80.0–100.0)
Platelets: 189 10*3/uL (ref 150–400)
RBC: 4.64 MIL/uL (ref 3.87–5.11)
RDW: 14.5 % (ref 11.5–15.5)
WBC: 5.2 10*3/uL (ref 4.0–10.5)
nRBC: 0 % (ref 0.0–0.2)

## 2021-04-30 LAB — BASIC METABOLIC PANEL
Anion gap: 6 (ref 5–15)
BUN: 21 mg/dL — ABNORMAL HIGH (ref 6–20)
CO2: 28 mmol/L (ref 22–32)
Calcium: 9.2 mg/dL (ref 8.9–10.3)
Chloride: 103 mmol/L (ref 98–111)
Creatinine, Ser: 1.14 mg/dL — ABNORMAL HIGH (ref 0.44–1.00)
GFR, Estimated: 57 mL/min — ABNORMAL LOW (ref >60)
Glucose, Bld: 102 mg/dL — ABNORMAL HIGH (ref 70–99)
Potassium: 4.5 mmol/L (ref 3.5–5.1)
Sodium: 137 mmol/L (ref 135–145)

## 2021-04-30 LAB — IRON AND TIBC
Iron: 73 ug/dL (ref 28–170)
Saturation Ratios: 18 % (ref 10.4–31.8)
TIBC: 402 ug/dL (ref 250–450)
UIBC: 329 ug/dL

## 2021-04-30 LAB — FERRITIN: Ferritin: 67 ng/mL (ref 11–307)

## 2021-04-30 MED ORDER — ENTRESTO 24-26 MG PO TABS
1.0000 | ORAL_TABLET | Freq: Two times a day (BID) | ORAL | 11 refills | Status: DC
Start: 2021-04-30 — End: 2021-10-21

## 2021-04-30 MED ORDER — SPIRONOLACTONE 25 MG PO TABS: 25.0000 mg | ORAL_TABLET | Freq: Every day | ORAL | 11 refills | Status: DC

## 2021-04-30 NOTE — Progress Notes (Signed)
\   ADVANCED HF CLINIC NOTE  PCP: Dr. Margo Aye HF Cardiology: Dr. Shirlee Latch  Ms. Jodie Echevaria is a 56 y.o. with history of CAD s/p anterior STEMI with DES to LAD, ischemic cardiomyopathy, mitral regurgitation, and paroxysmal atrial fibrillation.  She was admitted in 7/17 with anterior STEMI.  She had a late presentation to the cath lab.  Echo in 10/17 showed EF about 25% with LAD-territory wall motion abnormalities.  She had peri-MI atrial fibrillation and was started on Xarelto.  She was put on amiodarone.   After discharge, she continued to feel poorly.  She developed nausea, anorexia, and significant fatigue.  She was admitted for evaluation in 10/17 given concern for low output heart failure.  However, stopping amiodarone essentially resolved her symptoms.  She had RHC showing preserved cardiac output but the PCWP was high due to prominent v-waves, presumably from significant mitral regurgitation.  Of note, she was in atrial fibrillation for a time during this admission.   She had [employer] Jude ICD placed.  She is back at work for the [employer]   TEE in 8/18 to evaluate the mitral valve showed EF 30%, moderate MR (probably functional).    Patient was seen by Dr. Elberta Fortis and noted to have very frequent PVCs on device interrogation.  Holter in 12/18 showed 41% PVCs.  She was started on sotalol 80 mg bid and titrated up to 120 mg bid.  Coreg was stopped with the addition of sotalol and losartan was decreased to 12.5 mg daily due to low BP.  She had PVC ablation in 2/19 but PVCs recurred. Sotalol was increased to 160 mg bid.   Echo 8/19 showed EF 30% with regional wall motion abnormalities, normal RV size and systolic function, moderate MR.   She was admitted in 10/19 with chest pain.  Cath showed patent LAD stent and no obstructive CAD.  She was found to be anemic with Fe deficiency (hgb down to 8).  FOBT+. She has had IV Fe and blood transfusion.  EGD and colonoscopy this year were fairly  unremarkable.  She had diverticulosis.  Capsule endoscopy negative in 9/21.   Echo in 1/22 showed EF 25-30%, wall motion abnormalities in LAD distribution, mild-moderate MR, normal RV.   Admitted 11/28/2020 with anemia, hemoglobin of 8.6 (baseline around 10-11). She was transfused 1 unit of PRBC on 11/29/2020. GI was consulted and she underwent EGD on 11/30/2020 which showed no source of bleeding.  Capsule endoscopy on 12/01/2020 showed blood in the mid/distal small bowel and proximal colon.  Colonoscopy on 12/02/2020 showed blood in the terminal ileum but no clear source.  It is suspected that the source of bleeding is in the distal small bowel that is not able to be reached via routine colonoscope.  GI asked IR to consider an arteriogram to localize and treat the source of her bleeding. CT done and felt best option was DBE with Dr. Theodore Demark at [facility]. Double balloon endoscopy was done in 12/22, no definite cause of GI bleeding was found.   She returns for followup of CHF.  She has been off Entresto since her GI bleed.  She has been exercising more, gets very fatigued walking on the treadmill.  Dyspnea/fatigue with hills/inclines.  She can get up a flight of stairs without much trouble. No BRBPR/melena.  No chest pain.  No lightheadedness.    ECG (personally reviewed): NSR, LAFB, RBBB, QTc 412  Labs (10/17): K 3.9, creatinine 1.24, hgb 10.3 Labs (11/17): K 4, creatinine  1.27 Labs (12/17): K 4.2, creatinine 1.12, BNP 439 Labs (2/18): K 4.7, creatinine 1.37 Labs (7/18): K 3.9, creatinine 1.14, hgb 11.7 Labs (12/18): K 4.5, creatinine 1.23 Labs (1/19): K 4.4, creatinine 1.14 Labs (2/19): LDL 97 (off atorvastatin), HDL 56 Labs (5/19): K 4.3, creatinine 1.1 Labs (10/19): K 4.2, creatinine 1.2, LDL 70, hgb 11.8 Labs (11/19): K 4.9, creatinine 1.17, hgb 11.8, LDL 68, low TSH  Labs (9/20): K 4.4, creatinine 1.34, LDL 52 Labs (12/20): K 4.6, creatinine 1.19 Labs (6/21): LDL 73, HDL 62, hgb 11.9, K 4.2,  creatinine 1.14 Labs (7/21): hgb 11.2, K 3.8, creatinine 1.12 Labs (10/21): K 4.5, creatinine 1.38, LDL 53, HDL 50 Labs (1/22): K 4.9, creatinine 0.99 Labs (2/22): hgb 11.6, Fe studies normal Labs (5/22): K 5, creatinine 1.05, hgb 11.8 Labs (10/22): K 3.9, creatinine 1.02, hgb 7.1 Labs (11/22): hgb 11.9, K 3.7, creatinine 1.02, BNP 347  PMH:  1. CAD: Anterior STEMI in 7/17 with late presentation to the cath lab. She had DES to proximal LAD.  No significant disease in other vessels.  - LHC (10/19): Patent LAD stent, no obstructive disease.  2. Chronic systolic CHF: Ischemic cardiomyopathy.  - Echo 10/17 with EF 25%, akinesis of the mid-apical anteroseptal and anterior walls, apical dyskinesis, moderate to severe MR with incomplete coaptation.  - RHC (10/17): mean RA 4, PA 59/17 mean 38, mean PCWP 27 with prominent V waves suggesting significant MR, CI 3.06, PVR 2 WU.  - ACEI cough.  - Echo (11/17): EF 30-35%, normal RV size and systolic function, severe MR with restriction of posterior leaflet.   - Echo (8/19): EF 30% with regional wall motion abnormalities, normal RV size and systolic function, moderate MR.  - Echo (12/20): EF 30%, LAD wall motion abnormalities, normal RV, PASP 35, moderate functional MR.  - Echo (1/22): EF 25-30%, wall motion abnormalities in LAD distribution, mild-moderate MR, normal RV.  3. Atrial fibrillation: Paroxysmal.  Noted 7/17 admission peri-MI, also noted again 10/17 admission. She did not tolerate amiodarone due to nausea/anorexia.  4. Mitral regurgitation: Moderate to severe on 10/17 echo, incomplete coaptation.  ?Etiology, her MI (anterior) does not typically lead to ischemic MR. - Echo in 11/17 with severe MR, posterior leaflet restricted.  - TEE (8/18): EF 30%, moderately dilated LV with akinetic septal and anterior walls, moderate functional MR, normal RV size and systolic function.  - Echo (8/19): Moderate MR 5. Hyperlipidemia: Myalgias with atorvastatin.   6. PVCs: Holter 12/18 with 41% PVCs.  - PVC ablation in 2/19 with recurrence of PVCs.  7. GI bleeding:  - EGD (4/21): unremarkable.  - Colonoscopy (7/21): Diverticulosis.  - Capsule endoscopy (9/21): negative.  - Double balloon endoscopy at [facility] in 12/22 did not show cause for bleeding.   SH: Married, lives in [place], works for the [employer], no smoking or ETOH.   Family History  Problem Relation Age of Onset   Heart attack Father    Arrhythmia Mother    Lung cancer Mother        bronchiectasis   Colon cancer Neg Hx    Colon polyps Neg Hx    ROS: All systems reviewed and negative except as per HPI.   Current Outpatient Medications on File Prior to Encounter  Medication Sig Dispense Refill   acetaminophen (TYLENOL) 500 MG tablet Take 1,000 mg by mouth every 6 (six) hours as needed for mild pain.     apixaban (ELIQUIS) 5 MG TABS tablet Take 1 tablet (5 mg  total) by mouth 2 (two) times daily. 180 tablet 3   buPROPion (WELLBUTRIN XL) 300 MG 24 hr tablet Take 1 tablet by mouth daily.     Calcium Carb-Cholecalciferol (CALCIUM + D3 PO) Take 1 tablet by mouth every morning.     Coenzyme Q10 (COQ10) 200 MG CAPS Take 200 mg by mouth daily.     dexlansoprazole (DEXILANT) 60 MG capsule TAKE 1 CAPSULE(60 MG) BY MOUTH DAILY 90 capsule 3   fexofenadine (ALLEGRA) 180 MG tablet Take 180 mg by mouth daily.     nitroGLYCERIN (NITROSTAT) 0.4 MG SL tablet PLACE 1 TABLET UNDER THE TONGUE EVERY 5 MINUTES AS NEEDED FOR CHEST PAIN 25 tablet 3   rosuvastatin (CRESTOR) 10 MG tablet TAKE 1 TABLET(10 MG) BY MOUTH DAILY 90 tablet 3   sotalol (BETAPACE) 160 MG tablet TAKE 1 TABLET BY MOUTH EVERY MORNING AND 1/2 TABLET EVERY EVENING 90 tablet 0   thyroid (ARMOUR) 120 MG tablet Take 120 mg by mouth daily before breakfast.     traZODone (DESYREL) 50 MG tablet Take 0.5 tablets (25 mg total) by mouth at bedtime as needed for sleep. 30 tablet 2   triamcinolone (NASACORT) 55 MCG/ACT AERO nasal inhaler Place 2 sprays into  the nose daily.      No current facility-administered medications on file prior to encounter.   Wt Readings from Last 3 Encounters:  04/30/21 68.9 kg (151 lb 12.8 oz)  03/18/21 69.4 kg (153 lb)  03/05/21 70.2 kg (154 lb 12.8 oz)   BP 92/60    Pulse (!) 53    Wt 68.9 kg (151 lb 12.8 oz)    SpO2 100%    BMI 22.42 kg/m  General: NAD Neck: No JVD, no thyromegaly or thyroid nodule.  Lungs: Clear to auscultation bilaterally with normal respiratory effort. CV: Nondisplaced PMI.  Heart regular S1/S2, no S3/S4, no murmur.  No peripheral edema.  No carotid bruit.  Normal pedal pulses.  Abdomen: Soft, nontender, no hepatosplenomegaly, no distention.  Skin: Intact without lesions or rashes.  Neurologic: Alert and oriented x 3.  Psych: Normal affect. Extremities: No clubbing or cyanosis.  HEENT: Normal.   Assessment/Plan: 1. CAD: S/p anterior MI in 7/17 with DES to RCA.  Cath in 10/19 with no obstructive CAD.  She had an episode of CP related to emotional stress about 3-4 weeks ago, no chest pain since that time.  - She is off Plavix now and taking apixaban 5 mg bid.   - She is tolerating Crestor without myalgias.  Check lipids next appt.      2. Chronic systolic CHF: Ischemic cardiomyopathy.  EF 25% on echo in 10/17, 30-35% on echo in 11/17, 30% on TEE in 8/18. She has extensive LAD-territory scar. 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  Echo in 1/22 showed that EF remained 25-30% with LAD-territory scar.  She is not volume overloaded on exam, still with NYHA class III symptoms with prominent fatigue.  Low BP has made adjustment of GDMT difficult.  She denies significant lightheadedness currently.   - Off Coreg with low BP and addition of sotalol.   - I am going to try her back on Entresto 24/26 bid.  She took this without difficulty before GI bleed.  I will also have her stop Lasix. BMET today and again in 10 days.  - She did not tolerate dapagliflozin due to yeast infections.   - Continue spironolactone 25 mg  daily.   - QRS does not appear to be  wide enough that she would have benefit from CRT.  - I will have her evaluated for Batwire, I think she would be a good candidate.  - Repeat echo at followup in 2 months.  3. Mitral regurgitation: Moderate to severe on 10/17 echo, severe on 11/17 echo.  I did a TEE in 8/18.  This showed moderate MR that appeared to be functional.  No indication for surgery or Mitraclip. Echo in 1/22 showed mild-moderate MR.  4. Atrial fibrillation: Unable to tolerate amiodarone due to GI side effects.  She is in NSR today on Eliquis and sotalol. QTc interval is acceptable on sotalol on today's ECG.    5. PVCs: Very frequent on 12/18 holter (41% beats), she has had a PVC ablation in 2/19. Due to recurrence of PVCs post-ablation, she is now on sotalol. She does not seem to have many PVCs.  - Continue sotalol, QTc ok on ECG today.  6. Hyperlipidemia: Tolerating Crestor, check lipids next appt.  7. GI bleeding: With anemia.  She had episode in 10/22, has not had overt recurrence. She has been considered for Watchman.   - She wants to hold off on Watchman for now, will to consider with a close recurrence of GI bleeding.  - Check CBC and Fe studies today.   Marca Ancona 04/30/2021

## 2021-04-30 NOTE — Progress Notes (Signed)
Disability form completed and signed by Dr Shirlee Latch. Pt aware and form mailed to her home per her request ? ?Meredith Staggers, RN, BSN, CHFN ?Specialty Coordinator ?[facility] ? ?

## 2021-04-30 NOTE — Patient Instructions (Signed)
Medication Changes: ? ?Stop Lasix. ? ?Start Entresto 24/26 Twice daily ? ? ?Lab Work: ? ?Labs done today, your results will be available in MyChart, we will contact you for abnormal readings. ? ? ?Testing/Procedures: ? ?Your physician has requested that you have an echocardiogram. Echocardiography is a painless test that uses sound waves to create images of your heart. It provides your doctor with information about the size and shape of your heart and how well your heart?s chambers and valves are working. This procedure takes approximately one hour. There are no restrictions for this procedure. ? ?Repeat blood work in [place] in 10-14 days ? ?Referrals: ? ?none ? ?Special Instructions // Education: ? ?none ? ?Follow-Up in: 2 months with Echocardiogram  ? ?At the [facility], you and your health needs are our priority. We have a designated team specialized in the treatment of Heart Failure. This Care Team includes your primary Heart Failure Specialized Cardiologist (physician), Advanced Practice Providers (APPs- Physician Assistants and Nurse Practitioners), and Pharmacist who all work together to provide you with the care you need, when you need it.  ? ?You may see any of the following providers on your designated Care Team at your next follow up: ? ?Dr Arvilla Meres ?Dr Marca Ancona ?Tonye Becket, NP ?Robbie Lis, PA ?Jessica Milford,NP ?Anna Genre, PA ?Karle Plumber, PharmD ? ? ?Please be sure to bring in all your medications bottles to every appointment.  ? ?Need to Contact us: ? ?If you have any questions or concerns before your next appointment please send [place] a message through [place] or call our office at [phone].   ? ?TO LEAVE A MESSAGE FOR THE NURSE SELECT OPTION 2, PLEASE LEAVE A MESSAGE INCLUDING: ?YOUR NAME ?DATE OF BIRTH ?CALL BACK NUMBER ?REASON FOR CALL**this is important as we prioritize the call backs ? ?YOU WILL RECEIVE A CALL BACK THE SAME DAY AS LONG AS YOU CALL  BEFORE 4:00 PM ? ? ?

## 2021-05-01 ENCOUNTER — Other Ambulatory Visit: Payer: Self-pay | Admitting: *Deleted

## 2021-05-01 DIAGNOSIS — Z006 Encounter for examination for normal comparison and control in clinical research program: Secondary | ICD-10-CM

## 2021-05-01 NOTE — Progress Notes (Signed)
Orders for batwire echo and carotid  ?

## 2021-05-02 ENCOUNTER — Encounter (HOSPITAL_COMMUNITY): Payer: Self-pay | Admitting: Hematology

## 2021-05-07 ENCOUNTER — Encounter (HOSPITAL_COMMUNITY): Payer: Self-pay | Admitting: Hematology

## 2021-05-08 ENCOUNTER — Ambulatory Visit (HOSPITAL_BASED_OUTPATIENT_CLINIC_OR_DEPARTMENT_OTHER)
Admission: RE | Admit: 2021-05-08 | Discharge: 2021-05-08 | Disposition: A | Discharge status: Home / Self Care | Payer: BC Managed Care – PPO | Source: Ambulatory Visit | Attending: Internal Medicine | Admitting: Internal Medicine

## 2021-05-08 ENCOUNTER — Encounter: Payer: Self-pay | Admitting: *Deleted

## 2021-05-08 ENCOUNTER — Ambulatory Visit (HOSPITAL_COMMUNITY)
Admission: RE | Admit: 2021-05-08 | Discharge: 2021-05-08 | Disposition: A | Discharge status: Home / Self Care | Payer: BC Managed Care – PPO | Source: Ambulatory Visit | Attending: Internal Medicine | Admitting: Internal Medicine

## 2021-05-08 ENCOUNTER — Other Ambulatory Visit: Payer: Self-pay | Admitting: *Deleted

## 2021-05-08 ENCOUNTER — Other Ambulatory Visit: Payer: Self-pay

## 2021-05-08 VITALS — BP 94/64 | HR 63 | Wt 149.0 lb

## 2021-05-08 DIAGNOSIS — Z006 Encounter for examination for normal comparison and control in clinical research program: Secondary | ICD-10-CM

## 2021-05-08 LAB — ECHOCARDIOGRAM COMPLETE
Area-P 1/2: 2.33 cm2
Calc EF: 28.6 %
S' Lateral: 5.3 cm
Single Plane A2C EF: 23 %
Single Plane A4C EF: 28.5 %

## 2021-05-08 NOTE — Progress Notes (Signed)
Orders placed for CTA

## 2021-05-08 NOTE — Research (Signed)
Batwire Informed Consent  ? ?Subject Name: Catherine Mays ? ?Subject met inclusion and exclusion criteria.  The informed consent form, study requirements and expectations were reviewed with the subject and questions and concerns were addressed prior to the signing of the consent form.  The subject verbalized understanding of the trial requirements.  The subject agreed to participate in the [facility] trial and signed the informed consent on 05/08/2021.  The informed consent was obtained prior to performance of any protocol-specific procedures for the subject.  A copy of the signed informed consent was given to the subject and a copy was placed in the subject's medical record.  ? ?[name] ? ? ?

## 2021-05-08 NOTE — Progress Notes (Signed)
?  Echocardiogram ?2D Echocardiogram has been performed. ? Catherine Mays ?05/08/2021, 10:44 AM ?

## 2021-05-09 ENCOUNTER — Encounter (HOSPITAL_COMMUNITY): Payer: BC Managed Care – PPO

## 2021-05-09 ENCOUNTER — Ambulatory Visit (HOSPITAL_COMMUNITY): Payer: Self-pay

## 2021-05-09 LAB — BASIC METABOLIC PANEL
BUN/Creatinine Ratio: 20 (ref 9–23)
BUN: 21 mg/dL (ref 6–24)
CO2: 24 mmol/L (ref 20–29)
Calcium: 9.2 mg/dL (ref 8.7–10.2)
Chloride: 99 mmol/L (ref 96–106)
Creatinine, Ser: 1.04 mg/dL — ABNORMAL HIGH (ref 0.57–1.00)
Glucose: 104 mg/dL — ABNORMAL HIGH (ref 70–99)
Potassium: 4.7 mmol/L (ref 3.5–5.2)
Sodium: 138 mmol/L (ref 134–144)
eGFR: 63 mL/min/{1.73_m2} (ref >59)

## 2021-05-09 LAB — PRO B NATRIURETIC PEPTIDE: NT-Pro BNP: 2329 pg/mL — ABNORMAL HIGH (ref 0–287)

## 2021-05-09 NOTE — Progress Notes (Signed)
Batwire screening 025. Will give patient results.

## 2021-05-09 NOTE — Progress Notes (Signed)
Batwire screening 025. Patient aware of results.

## 2021-05-09 NOTE — Progress Notes (Signed)
Batwire 025 screening. This is an exclusion the NT has to be below 1600. We will let the patient know. Dr Shirlee Latch please review.

## 2021-05-09 NOTE — Research (Addendum)
Batwire screening:  Patient screened fail to due to high pro NT BNP.   Section A:  Administrative Section  Subject ID: _[id number]_ - _025_ - 015 Subject Initials: _D_ _-_ _K_  Date subject signed informed consent  _15_/_mar_/_2023______      (DD /  MMM      / YYYY)  Has the subject been previously screened?    [x]   No     []    Yes                                                                                  Previous Subject ID   __ __ __ __ - __ __ __ - 015       Has the subject been randomized in the BeAT-HF Trial?    [x]   No     []    Yes  Previous Subject ID   __ __ __ __ - __ __ __ - 013  Section B:  Enrollment Criteria  In the investigator's opinion does the subject meet the FDA Indication for Use:  [x]  Yes    []  No (STOP - subject does not qualify for the study)          Has the subject received cardiac resynchronization therapy (CRT) within six months of enrollment, or is actively receiving CRT? []  Yes (STOP - subject does not qualify for the study)   [x]  No  Section C:  Serum Biomarkers   NT-proBNP: _2329___ NT-pro BNP: _1966______ NT-pro BNP __2121_____ Date:    _15__/_mar_/_2023______                 24/Mar/2023                 05/Apr/2023                  (DD / MMM / Annamarie Major)  eGFR:  __63__ mL/min/1.18m2 Date:    _215__/_mar_/_2023___                  (DD / MMM / Annamarie Major)  Negative Pregnancy Test? []  N/A Date:    _15_/_mar_/_2023___                  (DD / MMM / Annamarie Major)   [x]  Yes     []  No   Section D:  Appropriate Surgical/Study Candidate:   Is the subject able to discontinue the use of antiplatelet drugs (e.g. aspirin) in advance of the procedure, if required? [x]  Yes   []  No  Assessed by:  Durene Cal, MD __ Implanting Physician [x]  Yes Date:    _15_/_mar_/_2023___                 (DD / MMM / Annamarie Major)   []  No   Assessed by:   ________________________ Vascular / Other Surgeon []  Yes Date:    _____/_______/_______                 (DD / MMM / Annamarie Major)     [x]  No    Section E:  Inclusion/Exclusion Criteria  Does the subject meet all Inclusion Criteria? []  Yes   [x]  No (STOP - subject does not  qualify for the study)   Check all that apply   []  Age at least 21 years and no more than 80 years at the time of enrollment.   []  Appropriate candidate for the surgery as determined by an evaluation from the implanting physician using a carotid duplex ultrasound (CDU) [or Computed Tomography Angiography (CTA) if CDU inconclusive or incomplete] and a review of medical history (including existence of infections that may increase implant risk).  Evaluation must confirm the following within 45 days of the BAROSTIM NEO implant: Appropriate medical condition and medical history for implantation of the BAROSTIM NEO System AND Anatomy that enables this implant procedure, with no vascular structures or orientations or neck anomalies that would be obstructive to the implantation path AND The artery planned for the BAROSTIM NEO implant must have: A carotid bifurcation below the level of the mandible AND No ulcerative carotid arterial plaques AND No carotid atherosclerosis producing a 30% or greater stenosis in linear diameter in the internal carotid AND No carotid atherosclerosis producing a 30% or greater stenosis in linear diameter in the distal common carotid AND Have had no prior surgery, radiation, or endovascular stent placement in the carotid artery or the carotid sinus region AND Able to discontinue the use of antiplatelet drugs (e.g. aspirin) in advance of the procedure, if required   []  Six-minute hall walk ( ) >= 150 m AND <= 400 m within 45 days prior to implant.   []  Serum estimated glomerular filtration rate (eGFR) >= 25 mL/min/1.73 m2 using the CKD-EPI method within 45 days prior to the [facility] NEO implant.   []  Body mass index <= 40 kg/m2 within 45 days prior to the [facility] NEO implant.   []  If female and of childbearing potential, must use a medically  accepted method of birth control (e.g., barrier method with spermicide, oral contraceptive, or abstinence) and agree to continue use of this method for the duration of the study. Women of childbearing potential must have a negative pregnancy test within 14 days prior to the [facility] NEO implant.   []  Subjects implanted with a cardiac rhythm management device that does not utilize an intracardiac lead, or implanted with a neurostimulation device, must be approved by the [facility] Clinical department.   [x]  At the end of screening/baseline, subject still meets the Barostim Neo Indication for Use   []  Signed a [facility]-approved informed consent form for participation in this study.      Does the subject meet any Exclusion Criteria? []  Yes (STOP - subject does not qualify for the study)   [x]  No     List criteria # met: __________________________   []  Received cardiac resynchronization therapy (CRT) within six months of enrollment or is actively receiving CRT.   []  Any of the following contraindications: Baroreflex failure or autonomic neuropathy  Uncontrolled, symptomatic cardiac bradyarrhythmias Known allergy to silicone or titanium   []  Unstable ventricular arrhythmias.   []  Presence of baseline cranial nerve dysfunction at risk from cervical interventions on the carotid bifurcation determined by the Ear, Nose and Throat (ENT) examination.   []  Subjects with any surgery that has occurred, or is planned to occur, within 45 days of the [facility] NEO implant.   []  Recent history (within 6 months of implant) of significant and uncontrolled bleeding.   []  Known and untreated hypercoagulability state.   []  An inappropriate study candidate as evidenced by: Solid organ or hematologic transplant, or currently being evaluated for an organ transplant. Has received or is receiving  LVAD therapy or chronic dialysis. Current or planned treatment with intravenous positive inotrope therapy. Primary pulmonary  hypertension. Severe COPD or severe restrictive lung disease (e.g. requires chronic oral steroid use or home oxygen use).  Heart failure secondary to a reversible cause, such as cardiac structural valvular disease, acute myocarditis and pericardial constriction. Clinically significant cardiac structural valvular disease.  Unable or unwilling to fulfill the protocol medication compliance and follow-up requirements, for reasons including but not limited to an unresolved history of alcohol or substance abuse or psychiatric disorder. Active malignancy.  Any other serious medical condition that may adversely affect the safety of the participant or validity of the study, in the opinion of the investigator. Life expectancy less than one year.   []  Any of the following within 3 months prior to the [facility] NEO implant. Myocardial infarction Unstable angina Coronary intervention (e.g. CABG or PTCA)  Cerebral vascular accident or transient ischemic attack Sudden cardiac death  Surgical cardiac intervention (e.g. cardiac ablation, valve replacement)   []  Enrolled and active in another (e.g. device, pharmaceutical, or biological) clinical study unless approved by the [facility] Clinical department.  Section F:  Adverse Events  Were there any Adverse Events that occurred since consent? []  Yes (complete AE form)   [x]  No  Section H:  Medication Changes   Have there been any changes to the subject's home use medications for Arrhythmia, Antiplatelet/Anticoagulation and Heart failure medications during screening and baseline? []  Yes (update Med form)   [x]  No  Section I:  Signature    Person completing form (Print Name): ___Kimberly Ivory Broad :) _______________________  Signature: Mercer Pod :) ____ Date: ___03/15/2023__________________     Section A:  Administrative Section  Subject ID: _[id number]_ - _025_ - 015 Subject Initials: _D_ _-_ _K_  Visit Interval:    [x]   Screening/Baseline    []  Activation    []  0.5 Month       []  1 Month     []  2 Month     []  3 Month       []  6 Month     []  12 Month         []  Unscheduled, reason for visit: _________________________________________  Section B:  Physical Assessment   Date:    _15_/_Mar_/_2023______   (DD / MMM / YYYY)  Weight: __149_  []  kg    [x]  pounds Height (Screening Visit Only): _175_______  []  cm []  inches  Blood Pressure: _94_  / _64_mmHg Heart Rate: _63_ bpm  Section E:  Signature    Person completing form (Print Name): Mercer Pod :) ______________  Signature: Mercer Pod :) _____ Date: __03/15/2023________________________     SECTION A:  Administrative Section  Patient ID: _[id number]_ - _025_ - 015 Patient Initials: _D_ _-_ _K__  Visit Interval:    [x]   Screening/Baseline*    []  Activation   []  0.5 Month       []  1 Month     []  2 Month     []  3 Month       []  6 Month     []  12 Month         []  Unscheduled, reason for visit: _________________________________________  * For Screening / Baseline only the questions are based on 30 days prior to consent.  SECTION B:  COVID-19 Like Illness Symptoms   Has the subject experienced any cold, flu or COVID-19 symptoms since the last study visit? [x]   No  (skip to section C)     []   Yes, date of onset:  _____/_____ MMM/YYYY    If yes, check all symptoms that apply:   []  Fevers or chills   []   New or Worsening Cough  []  Productive  []  Dry     If yes to cough, indicate severity  []  Constant  []  Occasional, several per hour   []  New or worsened shortness of breath   []  Diarrhea   []  Altered or reduced sense of smell or taste   []  Muscle aches/Severe Fatigue   []  Chest pain or tightness   []  Sore throat   []  Nausea or vomiting  SECTION C:  COVID-19 Like Illness Testing   Has the patient been tested for COVID-19 since the last study visit? [x]  No     []  Yes, date of test:  _____/_____ MMM/YYYY    If yes, test results:   []  Positive []  Negative []  Unknown  Has the  patient been tested for COVID-19 Antibodies since the last study visit? [x]  No     []  Yes, date of test:  _____/_____ MMM/YYYY    If yes, test results:   []  Positive []  Negative []  Unknown  Has the patient been vaccinated for COVID-19 since the last study visit? []  No     [x]  Yes, date of test: _01_/_Feb_/_2022_ MMM/YYYY  Has the patient been tested for Influenza ("flu") since the last study visit? [x]  No     []  Yes, date of test:  _____/_____ MMM/YYYY    If yes, test results:   []  Positive []  Negative []  Unknown  Has the patient been vaccinated for Influenza ("flu") since the last study visit? []  No     [x]  Yes, date of test: _01_/_Oct_ /_2022_ MMM/YYYY  SECTION D:  COVID-19 Like Illness Exposure  Has the subject been told that they might have had COVID-19/have symptoms suggestive of COVID-19 since the last study visit? [x]    No   []  Yes   []   Unknown  Has the subject been exposed to anyone with known or suspected COVID-19 since the last study visit? [x]    No   []  Yes   []   Unknown  Has the subject been told that they might have had the "flu" or influenza since the last study visit? [x]    No   []  Yes   []   Unknown  SECTION E:  Effects of COVID Pandemic on Subject's Healthcare Interactions  Since the last study visit, did the subject feel the need to go to an emergency department or hospital for their heart failure but decided not to because of concerns about COVID-19? [x]   No   []  Yes   []  Unknown    If yes, how did they seek care (check all that apply):   []  Telemedicine visit []  In-person []  Clinic []  Urgent Care   []  Subject did not change hospital/ER use due to COVID-19 []  Other, specify: ________________________  Since the last study visit, did the subject have a cardiology/HF related appointment cancelled/rescheduled due to COVID-19 pandemic? [x]   No   []  Yes, how many? ___________   []  Unknown  Since the last study visit, did the subject have any cardiology/HF related  telemedicine visit due to COVID-19 pandemic? [x]   No   []  Yes, how many? ___________   []  Unknown  Since the last study visit, did the subject have a cardiology/HF procedure cancelled/rescheduled due to COVID-19 pandemic? [x]   No   []  Yes, how many? ___________   []  Unknown  SECTION F:  Effects of COVID Pandemic on Subject's Medications  Since the last visit, did any of their heart failure medications change or stop, even for a short time?   If yes, enter change into medication eCRF [x]   No   []  Yes, how many? ___________   []  Unknown    If yes, why:   []  Instructed by Doctor []  Self-discontinued []  Unknown []  Other: ________________  Since the last visit, was the subject prescribed any medications for COVID-19? [x]   No   []  Yes   []  Unknown    If yes, what medications (generic name):  SECTION G:  Effects of COVID Pandemic on Subject's Lifestyle  How has the subject's activity/exercise level changed due to COVID-19 pandemic? [x]   No change   []  More activity/exercise   []  Less activity/exercise  How has the subject's smoking habits changed due to COVID-19? [x]   Does not smoke   []  No change   []  Smoke more   []  Smoke less  How has the subject's alcohol drinking habits changed due to COVID-19? [x]   Does not drink   []  No change   []  Drink more   []  Drink less  Section H:  Signature  Person completing form (Print Name): __Kimberly Fredric Slabach :) ___________________  Signature: Mercer Pod :) _________ Date: __03/15/2023________________________    Section A:  Administrative Section  Subject ID: _[id number]_ - _025_ - 015 Subject Initials: _D_ _-_ _K_ Visit Interval:  [x]   Baseline     []  6 Month     []  12 Month  Section B:  Cardiac Echo   Cardiac Echo Date:  _15_/_Mar__/_2023__ (DD / MMM / Annamarie Major)  [x]  Images sent to [facility]  Heart rate ___58_______ bpm Echo type: [x]  2D Images Captured: (4-Chamber and Biplane required) [x]  PS-LAX  Blood Pressure _92_/_60_ mmHg  []  3D  [x]  Biplane         [x]  Apical 4-Chamber        []  Other, specify:_______   LV End Diastolic Dimension __61_____ mm   LV End Systolic Dimension __53_____ mm   LA Volume __23_____ mL/m2   LV End-Diastolic Volume __160____ mL   LV End-Systolic Volume __114____ mL   LV Ejection Fraction ___30____ %  Section C:  Comments  N/a          Section C:  Signature   Person completing form (Print Name): Mercer Pod :) _____________________  Signature: Mercer Pod :) ________ Date: __03/15/2023______________   Section A:  Administrative Section  Subject ID: _[id number]_ - _025_ - 015 Subject Initials: _D_ _-_ _K_ Visit Interval:  [x]   Baseline     []  6 Month  Section B:  NYHA   NYHA Classification Date:    _15_/_Mar_/_2023_  (DD / MMM / Annamarie Major)  Select One Class Subject Symptoms  []  I No limitations of physical activity, No undue fatigue, palpitation or dyspnea  []  II Slight limitation of physical activity, Comfortable at rest, Less than ordinary activity results in fatigue, Palpitation, or dyspnea  [x]  III Marked limitation of physical activity, Comfortable at rest, Less than ordinary activity results in fatigue, palpitation, or dyspnea  []  IV Unable to carry out any physical activity without discomfort, Symptoms of cardiac insufficiency at rest, Physical activity causes increased discomfort  Name of person conducting NYHA: Mercer Pod :) ____________________  Section C:    Six Minute Hall Walk Test Date:    _15_/_Mar_/_2023___  (DD / MMM / Annamarie Major)  Distance Walked _396_ meters  Were there any devices used to assist the subject in walking (e.g. cane, walker, etc.)? [x]  No   []  Yes specify:_______________________  Was the walk terminated before 6 minutes? [x]  No   []  Yes specify reason: (select all that apply)    []  Angina    []   Dyspnea    []   Fatigue    []   Dizziness    []   Syncope    []   Other, specify:  Name of person conducting 6-Minute Hall Walk:  ______________________________________________  Section D:  Signature    Person completing form (Print Name): Mercer Pod :) ____________  Signature: Mercer Pod :) ____ Date: _03/15/2023_________________    Section A:  Demographic Section  Subject ID: _[id number]_ - _025_ - 015 Subject Initials: _D_ _-_ _K_  Date of Birth: _[birth year]_/_[birth year]_/_1967____      (DD / MMM / Annamarie Major) Gender: [] Female    [x]  Female  Ethnicity  [] Asian    [] Black/African Descent [] Middle Eastern        [x] White/Caucasian     [] Other/Refused   Section B:  Medical History  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 []       If no, date LV lead turned off________/________ (MMM / YYYY)   PA pressure device (e.g. CardioMEMS) Specify Device: ____________ Implant Date: ________/________ (MMM / Annamarie Major) [x]  []    Myocardial Infarction [x]  []  []    CABG []  [x]  []    Coronary Intervention [x]  []  []    Aortic Valve Disease []  [x]  []    Mitral Valve Disease []  [x]  []    Left Ventricular Hypertrophy []  [x]  []   Cardiac Arrhythmia Atrial fibrillation [x]  []  []    Ventricular Tachycardia/Fibrillation []  [x]  []    PVC [x]  []  []    Pacemaker []  [x]  []    ICD [x]  []  []    Ablation Procedure Specify: [] atrial [x]  ventricular Last procedure date:         __07_/ _Feb_/_2019__ (MMM / Annamarie Major) []  []    Cardioversion Procedure Specify: [] Shock []  Meds Last procedure date:         ________/________ (MMM / Annamarie Major) [x]  []    Sudden Cardiac Death []  [x]  []     Yes No Unknown  Peripheral Vascular Peripheral Vascular Disease []  [x]  []     PVD Intervention (e.g. stent, angioplasty, etc.). []  [x]  []   Cerebrovascular / Neurological Stroke []  [x]  []    TIA []  [x]  []   Renal / Hepatic Prior Renal Denervation Date of Denervation:        ________/________ (MMM / Annamarie Major) [x]  []    Chronic Kidney Disease Stage (1-5): ________   [x]  []    Chronic Dialysis []  [x]  []    Kidney Transplant []  [x]  []   Metabolic / Nutritional Diabetes Mellitus  []   Type 1  []   Type 2 [x]  []    Hypokalemia []  [x]  []   Other, specify               Section C:  Comments  N/A        Section D:  Signature   Person completing form (Print Name): Mercer Pod :) _______________________  Signature: Mercer Pod :) ______ Date: ___03/28/2023_______________________      Section A:  Administrative Section   Subject ID: _[id number]_ - _025_ - 015  Subject Initials: _D_ _-_ _K__  Section B:  Carotid Duplex Ultrasound (CDU) [x]  Done []  Not Done  Date: _15_/_Mar_/_2023__ (DD / MMM / YYYY)  [x]  Images sent to [facility]  % atherosclerosis in the internal and distal common carotid and duplex measurements:  Right Side Measurements    Both internal and distal common carotids are less than 30% []  Yes  []  No (cannot be used for implant)   Comments on determination of <30% stenosis:  Internal Carotid (ICA) []   Normal (no plaque) PSV (peak systolic velocity) __88_______ cm/sec   [x]   <=15% (*see CTA) EDV (end diastolic velocity) __34_______ cm/sec   []   16-49% (*see CTA) Linear diameter __61_______ mm   []   50-69% Normal spectral waveform  [x]  Yes   []   >=70%  []  No, specify pattern: __________________  Distal Common Carotid (DCC) []   Normal (no plaque) PSV (peak systolic velocity) _65______ cm/sec   [x]   <=15% (*see CTA) EDV (end diastolic velocity) _23____ cm/sec   []   16-49% (*see CTA) Linear diameter __61____ mm   []   50-69% Normal spectral waveform  [x]  Yes   []   >=70%  []  No, specify pattern: __________________  Left Side Measurements   Both internal  and distal common carotids are less than 30% [x]  Yes  []  No (cannot be used for implant)   Comments on determination of <30% stenosis:  Internal Carotid (ICA)  []   Stenosis Normal (no plaque) PSV (peak systolic velocity) __57_____ cm/sec   [x]   <=15% (*see CTA) EDV (end diastolic velocity) ____27___ cm/sec   []   16-49% (*see CTA) Linear diameter __47_____ mm   []   50-69%  Normal spectral waveform  [x]  Yes   []   >=70%   []  No, specify pattern: __________________  Distal Common Carotid [facility])  []   Stenosis Normal (no plaque) PSV (peak systolic velocity) __111____ cm/sec   [x]   <=15% (*see CTA) EDV (end diastolic velocity) ___43____ cm/sec   []   16-49% (*see CTA) Linear diameter _____87_ mm   []   50-69%  Normal spectral waveform  [x]  Yes   []   >=70%   []  No, specify pattern: __________________  Name of person reading CDU:  Gerarda Fraction  Section C:  Computed Tomography Angiogram (CTA) (if required) []  Done [x]  Not Done  []  CDU was inconclusive (e.g. *stenosis greater than 0%) Date: _____/_______/_______  (DD / MMM / Annamarie Major)  []  Images sent to [facility]  []  CDU was incomplete/not done    []  CTA requested by [facility]    % atherosclerosis Internal Carotid (ICA) Distal Common Carotid [facility])  Right Side _______% _______%  Left Side _______% _______%  Name of person reading CTA:

## 2021-05-10 ENCOUNTER — Other Ambulatory Visit: Payer: Self-pay | Admitting: *Deleted

## 2021-05-10 MED ORDER — FUROSEMIDE 20 MG PO TABS: 40.0000 mg | ORAL_TABLET | Freq: Every day | ORAL | 3 refills | Status: DC

## 2021-05-10 NOTE — Progress Notes (Signed)
Verbal order from Einar Crow, pt to double lasix to 40mg  daily  ?Lasix 40 mg daily will update med list in patient chart   ?repeat BMP and BNP next week. Patient aware of med change and lab draw scheduled for next Friday. Will re-screen patient for Batwire at that time.

## 2021-05-10 NOTE — Progress Notes (Signed)
Order entered for lasix 40 mg daily  ?Patient aware  ?

## 2021-05-13 ENCOUNTER — Other Ambulatory Visit (HOSPITAL_COMMUNITY): Payer: Self-pay

## 2021-05-13 NOTE — Progress Notes (Signed)
Patient aware of results.

## 2021-05-14 ENCOUNTER — Other Ambulatory Visit (HOSPITAL_COMMUNITY): Payer: BC Managed Care – PPO

## 2021-05-16 ENCOUNTER — Ambulatory Visit (HOSPITAL_COMMUNITY): Payer: BC Managed Care – PPO

## 2021-05-17 ENCOUNTER — Encounter: Payer: BC Managed Care – PPO | Admitting: *Deleted

## 2021-05-17 ENCOUNTER — Other Ambulatory Visit: Payer: Self-pay

## 2021-05-17 VITALS — Wt 147.2 lb

## 2021-05-17 DIAGNOSIS — Z006 Encounter for examination for normal comparison and control in clinical research program: Secondary | ICD-10-CM

## 2021-05-19 LAB — BASIC METABOLIC PANEL
BUN/Creatinine Ratio: 19 (ref 9–23)
BUN: 21 mg/dL (ref 6–24)
CO2: 24 mmol/L (ref 20–29)
Calcium: 9.2 mg/dL (ref 8.7–10.2)
Chloride: 100 mmol/L (ref 96–106)
Creatinine, Ser: 1.13 mg/dL — ABNORMAL HIGH (ref 0.57–1.00)
Glucose: 107 mg/dL — ABNORMAL HIGH (ref 70–99)
Potassium: 4.4 mmol/L (ref 3.5–5.2)
Sodium: 139 mmol/L (ref 134–144)
eGFR: 57 mL/min/{1.73_m2} — ABNORMAL LOW (ref >59)

## 2021-05-19 LAB — PRO B NATRIURETIC PEPTIDE: NT-Pro BNP: 1966 pg/mL — ABNORMAL HIGH (ref 0–287)

## 2021-05-20 ENCOUNTER — Other Ambulatory Visit: Payer: Self-pay | Admitting: *Deleted

## 2021-05-20 ENCOUNTER — Encounter (HOSPITAL_COMMUNITY): Payer: Self-pay | Admitting: Hematology

## 2021-05-20 NOTE — Progress Notes (Signed)
Batwire 025 screening labs, please review.

## 2021-05-20 NOTE — Progress Notes (Signed)
Pt aware and will return April 2 @ 9am for re-cheek

## 2021-05-20 NOTE — Research (Signed)
Batwire Rescreen.    ? ? ?Catherine Mays came in for [facility] rescreen ? ?Outpatient Encounter Medications as of 05/17/2021  ?Medication Sig  ? acetaminophen (TYLENOL) 500 MG tablet Take 1,000 mg by mouth every 6 (six) hours as needed for mild pain.  ? apixaban (ELIQUIS) 5 MG TABS tablet Take 1 tablet (5 mg total) by mouth 2 (two) times daily.  ? buPROPion (WELLBUTRIN XL) 300 MG 24 hr tablet Take 1 tablet by mouth daily.  ? Calcium Carb-Cholecalciferol (CALCIUM + D3 PO) Take 1 tablet by mouth every morning.  ? Coenzyme Q10 (COQ10) 200 MG CAPS Take 200 mg by mouth daily.  ? dexlansoprazole (DEXILANT) 60 MG capsule TAKE 1 CAPSULE(60 MG) BY MOUTH DAILY  ? fexofenadine (ALLEGRA) 180 MG tablet Take 180 mg by mouth daily.  ? furosemide (LASIX) 20 MG tablet Take 2 tablets (40 mg total) by mouth daily.  ? nitroGLYCERIN (NITROSTAT) 0.4 MG SL tablet PLACE 1 TABLET UNDER THE TONGUE EVERY 5 MINUTES AS NEEDED FOR CHEST PAIN  ? rosuvastatin (CRESTOR) 10 MG tablet TAKE 1 TABLET(10 MG) BY MOUTH DAILY  ? sacubitril-valsartan (ENTRESTO) 24-26 MG Take 1 tablet by mouth 2 (two) times daily.  ? sotalol (BETAPACE) 160 MG tablet TAKE 1 TABLET BY MOUTH EVERY MORNING AND 1/2 TABLET EVERY EVENING  ? spironolactone (ALDACTONE) 25 MG tablet Take 1 tablet (25 mg total) by mouth daily.  ? thyroid (ARMOUR) 120 MG tablet Take 120 mg by mouth daily before breakfast.  ? traZODone (DESYREL) 50 MG tablet Take 0.5 tablets (25 mg total) by mouth at bedtime as needed for sleep.  ? triamcinolone (NASACORT) 55 MCG/ACT AERO nasal inhaler Place 2 sprays into the nose daily.   ? ?No facility-administered encounter medications on file as of 05/17/2021.  ? ? ?

## 2021-05-20 NOTE — Progress Notes (Signed)
Open in error

## 2021-05-27 ENCOUNTER — Encounter (HOSPITAL_COMMUNITY): Payer: Self-pay | Admitting: Hematology

## 2021-05-27 ENCOUNTER — Other Ambulatory Visit (HOSPITAL_COMMUNITY): Payer: Self-pay

## 2021-05-27 MED ORDER — ROSUVASTATIN CALCIUM 10 MG PO TABS: ORAL_TABLET | ORAL | 1 refills | Status: DC

## 2021-05-29 ENCOUNTER — Encounter: Payer: BLUE CROSS/BLUE SHIELD | Admitting: *Deleted

## 2021-05-29 ENCOUNTER — Encounter: Payer: Self-pay | Admitting: *Deleted

## 2021-05-29 DIAGNOSIS — Z006 Encounter for examination for normal comparison and control in clinical research program: Secondary | ICD-10-CM

## 2021-05-29 NOTE — Research (Signed)
Redraw NT Pro BNP for BatWire screening ?

## 2021-05-29 NOTE — Addendum Note (Signed)
Addended by: Wendy Poet on: 05/29/2021 09:37 AM ? ? Modules accepted: Orders ? ?

## 2021-05-30 ENCOUNTER — Other Ambulatory Visit (HOSPITAL_COMMUNITY): Payer: Self-pay

## 2021-05-30 ENCOUNTER — Encounter: Payer: Self-pay | Admitting: *Deleted

## 2021-05-30 DIAGNOSIS — Z006 Encounter for examination for normal comparison and control in clinical research program: Secondary | ICD-10-CM

## 2021-05-30 LAB — PRO B NATRIURETIC PEPTIDE: NT-Pro BNP: 2121 pg/mL — ABNORMAL HIGH (ref 0–287)

## 2021-05-30 MED ORDER — FUROSEMIDE 20 MG PO TABS: 40.0000 mg | ORAL_TABLET | Freq: Every day | ORAL | 3 refills | Status: DC

## 2021-05-30 NOTE — Research (Signed)
Lab work came back and Pro NT BNP to high  ?Spoke with patient  ?Thanked her for participating.  ?

## 2021-05-30 NOTE — Progress Notes (Signed)
Batwire screen fail - cannot recheck at this time. Patient aware.

## 2021-05-30 NOTE — Progress Notes (Signed)
BATwire Rescreen BNP

## 2021-06-06 LAB — BASIC METABOLIC PANEL
BUN/Creatinine Ratio: 20 (ref 9–23)
BUN: 23 mg/dL (ref 6–24)
Calcium: 9 mg/dL (ref 8.7–10.2)
Chloride: 99 mmol/L (ref 96–106)
Creatinine, Ser: 1.16 mg/dL — ABNORMAL HIGH (ref 0.57–1.00)
Glucose: 110 mg/dL — ABNORMAL HIGH (ref 70–99)
Potassium: 4.1 mmol/L (ref 3.5–5.2)
Sodium: 140 mmol/L (ref 134–144)
eGFR: 56 mL/min/{1.73_m2} — ABNORMAL LOW (ref >59)

## 2021-06-07 NOTE — Progress Notes (Signed)
Please review BMP patient screen failed. Patient has follow up in may with you.

## 2021-06-18 ENCOUNTER — Ambulatory Visit (INDEPENDENT_AMBULATORY_CARE_PROVIDER_SITE_OTHER): Payer: BC Managed Care – PPO

## 2021-06-18 DIAGNOSIS — I255 Ischemic cardiomyopathy: Secondary | ICD-10-CM

## 2021-06-20 LAB — CUP PACEART REMOTE DEVICE CHECK
Battery Remaining Longevity: 59 mo
Battery Remaining Percentage: 56 %
Battery Voltage: 2.93 V
Brady Statistic RV Percent Paced: 1 %
Date Time Interrogation Session: 20230427121716
HighPow Impedance: 74 Ohm
HighPow Impedance: 74 Ohm
Implantable Lead Implant Date: 20171129
Implantable Lead Location: 753860
Implantable Pulse Generator Implant Date: 20171129
Lead Channel Impedance Value: 580 Ohm
Lead Channel Pacing Threshold Amplitude: 1.25 V
Lead Channel Pacing Threshold Pulse Width: 0.5 ms
Lead Channel Sensing Intrinsic Amplitude: 12 mV
Lead Channel Setting Pacing Amplitude: 2.5 V
Lead Channel Setting Pacing Pulse Width: 0.5 ms
Lead Channel Setting Sensing Sensitivity: 0.5 mV
Pulse Gen Serial Number: 7377983

## 2021-06-24 NOTE — Research (Signed)
Late entry:  ?Section A.   Administrative Section  ?Patient ID: [id number] - _[id number]_ - [id number] Patient Initials: _D_ _-_ _K_  ?Exit / Termination Date: _05_/_APR_/_2023_ ?                                           (DD / MMM / [place])  ?Section B.   Reason for Exit / Termination  ?[x]  Screening/baseline failure  ?[]  Failed Implant Attempt(s)  ?[]  Completed all Study Required Visits  ?[]  PI Withdrew  ?[]  Subject withdrew consent (add comment below)  ?[]  Subject refuses further follow-up testing (add comment below)  ?[]  Lost to Follow-up  ?[]  System Explanted (Complete Additional Procedure Worksheet)  ?[]  Deceased (Complete Subject Death Worksheet)  ?[]  Other, please specify: ________________________  ?Comments  ?NT pro BNP 2121  ?  ?  ?  ?Section C. Signature  ? ? ?Person completing form (Print Name): __Kimberly Chicken :) __________ ? ?Signature: Philemon Kingdom :) ____ Date: __05/01/2023____________________ ?  ? ? ?

## 2021-07-02 ENCOUNTER — Other Ambulatory Visit (HOSPITAL_COMMUNITY): Payer: BC Managed Care – PPO

## 2021-07-02 ENCOUNTER — Encounter (HOSPITAL_COMMUNITY): Payer: Self-pay | Admitting: Cardiology

## 2021-07-02 ENCOUNTER — Ambulatory Visit (HOSPITAL_COMMUNITY)
Admission: RE | Admit: 2021-07-02 | Discharge: 2021-07-02 | Disposition: A | Discharge status: Home / Self Care | Payer: BC Managed Care – PPO | Source: Ambulatory Visit | Attending: Cardiology | Admitting: Cardiology

## 2021-07-02 VITALS — BP 90/50 | HR 50 | Wt 150.0 lb

## 2021-07-02 DIAGNOSIS — I252 Old myocardial infarction: Secondary | ICD-10-CM | POA: Diagnosis present

## 2021-07-02 DIAGNOSIS — Z95810 Presence of automatic (implantable) cardiac defibrillator: Secondary | ICD-10-CM | POA: Diagnosis not present

## 2021-07-02 DIAGNOSIS — I480 Paroxysmal atrial fibrillation: Secondary | ICD-10-CM | POA: Diagnosis not present

## 2021-07-02 DIAGNOSIS — Z7901 Long term (current) use of anticoagulants: Secondary | ICD-10-CM | POA: Insufficient documentation

## 2021-07-02 DIAGNOSIS — E785 Hyperlipidemia, unspecified: Secondary | ICD-10-CM | POA: Insufficient documentation

## 2021-07-02 DIAGNOSIS — D500 Iron deficiency anemia secondary to blood loss (chronic): Secondary | ICD-10-CM | POA: Insufficient documentation

## 2021-07-02 DIAGNOSIS — Z79899 Other long term (current) drug therapy: Secondary | ICD-10-CM | POA: Insufficient documentation

## 2021-07-02 DIAGNOSIS — I5022 Chronic systolic (congestive) heart failure: Secondary | ICD-10-CM | POA: Diagnosis not present

## 2021-07-02 DIAGNOSIS — I340 Nonrheumatic mitral (valve) insufficiency: Secondary | ICD-10-CM | POA: Insufficient documentation

## 2021-07-02 DIAGNOSIS — I255 Ischemic cardiomyopathy: Secondary | ICD-10-CM | POA: Diagnosis not present

## 2021-07-02 DIAGNOSIS — I2510 Atherosclerotic heart disease of native coronary artery without angina pectoris: Secondary | ICD-10-CM | POA: Insufficient documentation

## 2021-07-02 LAB — BASIC METABOLIC PANEL
Anion gap: 6 (ref 5–15)
BUN: 25 mg/dL — ABNORMAL HIGH (ref 6–20)
CO2: 27 mmol/L (ref 22–32)
Calcium: 9 mg/dL (ref 8.9–10.3)
Chloride: 105 mmol/L (ref 98–111)
Creatinine, Ser: 0.98 mg/dL (ref 0.44–1.00)
GFR, Estimated: >60 mL/min (ref >60)
Glucose, Bld: 90 mg/dL (ref 70–99)
Potassium: 4.1 mmol/L (ref 3.5–5.1)
Sodium: 138 mmol/L (ref 135–145)

## 2021-07-02 LAB — LIPID PANEL
Cholesterol: 133 mg/dL (ref 0–200)
HDL: 47 mg/dL (ref >40)
LDL Cholesterol: 71 mg/dL (ref 0–99)
Total CHOL/HDL Ratio: 2.8 RATIO
Triglycerides: 74 mg/dL (ref <150)
VLDL: 15 mg/dL (ref 0–40)

## 2021-07-02 LAB — CBC
HCT: 37 % (ref 36.0–46.0)
Hemoglobin: 12.2 g/dL (ref 12.0–15.0)
MCH: 29.8 pg (ref 26.0–34.0)
MCHC: 33 g/dL (ref 30.0–36.0)
MCV: 90.2 fL (ref 80.0–100.0)
Platelets: 162 10*3/uL (ref 150–400)
RBC: 4.1 MIL/uL (ref 3.87–5.11)
RDW: 13 % (ref 11.5–15.5)
WBC: 4.1 10*3/uL (ref 4.0–10.5)
nRBC: 0 % (ref 0.0–0.2)

## 2021-07-02 NOTE — Progress Notes (Signed)
\ ? ? ?ADVANCED HF CLINIC NOTE ? ?PCP: Dr. Nevada Crane ?HF Cardiology: Dr. Aundra Dubin ? ?Catherine Mays is a 56 y.o. with history of CAD s/p anterior STEMI with DES to LAD, ischemic cardiomyopathy, mitral regurgitation, and paroxysmal atrial fibrillation. ? ?She was admitted in 7/17 with anterior STEMI.  She had a late presentation to the cath lab.  Echo in 10/17 showed EF about 25% with LAD-territory wall motion abnormalities.  She had peri-MI atrial fibrillation and was started on Xarelto.  She was put on amiodarone.  ? ?After discharge, she continued to feel poorly.  She developed nausea, anorexia, and significant fatigue.  She was admitted for evaluation in 10/17 given concern for low output heart failure.  However, stopping amiodarone essentially resolved her symptoms.  She had RHC showing preserved cardiac output but the PCWP was high due to prominent v-waves, presumably from significant mitral regurgitation.  Of note, she was in atrial fibrillation for a time during this admission.  ? ?She had St Jude ICD placed.  She is back at work for the [employer].  ? ?TEE in 8/18 to evaluate the mitral valve showed EF 30%, moderate MR (probably functional).   ? ?Patient was seen by Dr. Curt Bears and noted to have very frequent PVCs on device interrogation.  Holter in 12/18 showed 41% PVCs.  She was started on sotalol 80 mg bid and titrated up to 120 mg bid.  Coreg was stopped with the addition of sotalol and losartan was decreased to 12.5 mg daily due to low BP.  She had PVC ablation in 2/19 but PVCs recurred. Sotalol was increased to 160 mg bid.  ? ?Echo 8/19 showed EF 30% with regional wall motion abnormalities, normal RV size and systolic function, moderate MR.  ? ?She was admitted in 10/19 with chest pain.  Cath showed patent LAD stent and no obstructive CAD. ? ?She was found to be anemic with Fe deficiency (hgb down to 8).  FOBT+. She has had IV Fe and blood transfusion.  EGD and colonoscopy this year were fairly  unremarkable.  She had diverticulosis.  Capsule endoscopy negative in 9/21.  ? ?Echo in 1/22 showed EF 25-30%, wall motion abnormalities in LAD distribution, mild-moderate MR, normal RV.   ? ?Admitted 11/28/2020 with anemia, hemoglobin of 8.6 (baseline around 10-11). She was transfused 1 unit of PRBC on 11/29/2020. GI was consulted and she underwent EGD on 11/30/2020 which showed no source of bleeding.  Capsule endoscopy on 12/01/2020 showed blood in the mid/distal small bowel and proximal colon.  Colonoscopy on 12/02/2020 showed blood in the terminal ileum but no clear source.  It is suspected that the source of bleeding is in the distal small bowel that is not able to be reached via routine colonoscope.  GI asked IR to consider an arteriogram to localize and treat the source of her bleeding. CT done and felt best option was DBE with Dr. Malissa Hippo at [place]. Double balloon endoscopy was done in 12/22, no definite cause of GI bleeding was found.  ? ?Echo in 3/23 showed EF 25-30%, apical akinesis, mild MR, normal RV, normal IVC.  ? ?She returns for followup of CHF.  She is lightheaded if she stands up too fast but generally not dizzy.  Overall feels good.  She is short of breath walking up hills or up stairs.  No chest pain.  No orthopnea/PND.  No BRBPR/melena. Weight down 1 lb.  ? ?ECG (personally reviewed): NSR, LAFB, IVCD QRS 134 msec, QTc  409 ? ?Labs (10/17): K 3.9, creatinine 1.24, hgb 10.3 ?Labs (11/17): K 4, creatinine 1.27 ?Labs (12/17): K 4.2, creatinine 1.12, BNP 439 ?Labs (2/18): K 4.7, creatinine 1.37 ?Labs (7/18): K 3.9, creatinine 1.14, hgb 11.7 ?Labs (12/18): K 4.5, creatinine 1.23 ?Labs (1/19): K 4.4, creatinine 1.14 ?Labs (2/19): LDL 97 (off atorvastatin), HDL 56 ?Labs (5/19): K 4.3, creatinine 1.1 ?Labs (10/19): K 4.2, creatinine 1.2, LDL 70, hgb 11.8 ?Labs (11/19): K 4.9, creatinine 1.17, hgb 11.8, LDL 68, low TSH  ?Labs (9/20): K 4.4, creatinine 1.34, LDL 52 ?Labs (12/20): K 4.6, creatinine 1.19 ?Labs (6/21):  LDL 73, HDL 62, hgb 11.9, K 4.2, creatinine 1.14 ?Labs (7/21): hgb 11.2, K 3.8, creatinine 1.12 ?Labs (10/21): K 4.5, creatinine 1.38, LDL 53, HDL 50 ?Labs (1/22): K 4.9, creatinine 0.99 ?Labs (2/22): hgb 11.6, Fe studies normal ?Labs (5/22): K 5, creatinine 1.05, hgb 11.8 ?Labs (10/22): K 3.9, creatinine 1.02, hgb 7.1 ?Labs (11/22): hgb 11.9, K 3.7, creatinine 1.02, BNP 347 ?Labs (3/23): hgb 13.1 ?Labs (4/23): K 4.1, creatinine 1.16, pro-BNP 2121 ? ?PMH:  ?1. CAD: Anterior STEMI in 7/17 with late presentation to the cath lab. She had DES to proximal LAD.  No significant disease in other vessels.  ?- LHC (10/19): Patent LAD stent, no obstructive disease.  ?2. Chronic systolic CHF: Ischemic cardiomyopathy.  ?- Echo 10/17 with EF 25%, akinesis of the mid-apical anteroseptal and anterior walls, apical dyskinesis, moderate to severe MR with incomplete coaptation.  ?- RHC (10/17): mean RA 4, PA 59/17 mean 38, mean PCWP 27 with prominent V waves suggesting significant MR, CI 3.06, PVR 2 WU.  ?- ACEI cough.  ?- Echo (11/17): EF 30-35%, normal RV size and systolic function, severe MR with restriction of posterior leaflet.   ?- Echo (8/19): EF 30% with regional wall motion abnormalities, normal RV size and systolic function, moderate MR.  ?- Echo (12/20): EF 30%, LAD wall motion abnormalities, normal RV, PASP 35, moderate functional MR.  ?- Echo (1/22): EF 25-30%, wall motion abnormalities in LAD distribution, mild-moderate MR, normal RV.  ?- Echo (3/23): EF 25-30%, apical akinesis, mild MR, normal RV, normal IVC.  ?3. Atrial fibrillation: Paroxysmal.  Noted 7/17 admission peri-MI, also noted again 10/17 admission. She did not tolerate amiodarone due to nausea/anorexia.  ?4. Mitral regurgitation: Moderate to severe on 10/17 echo, incomplete coaptation.  ?Etiology, her MI (anterior) does not typically lead to ischemic MR.  ?- Echo in 11/17 with severe MR, posterior leaflet restricted.  ?- TEE (8/18): EF 30%, moderately  dilated LV with akinetic septal and anterior walls, moderate functional MR, normal RV size and systolic function.  ?- Echo (8/19): Moderate MR ?- Echo (3/23): Mild MR.  ?5. Hyperlipidemia: Myalgias with atorvastatin.  ?6. PVCs: Holter 12/18 with 41% PVCs.  ?- PVC ablation in 2/19 with recurrence of PVCs.  ?7. GI bleeding:  ?- EGD (4/21): unremarkable.  ?- Colonoscopy (7/21): Diverticulosis.  ?- Capsule endoscopy (9/21): negative.  ?- Double balloon endoscopy at [facility] in 12/22 did not show cause for bleeding.  ? ?SH: Married, lives in [place], works for the [employer], no smoking or ETOH.  ? ?Family History  ?Problem Relation Age of Onset  ? Heart attack Father   ? Arrhythmia Mother   ? Lung cancer Mother   ?     bronchiectasis  ? Colon cancer Neg Hx   ? Colon polyps Neg Hx   ? ?ROS: All systems reviewed and negative except as per HPI.  ? ?Current  Outpatient Medications on File Prior to Encounter  ?Medication Sig Dispense Refill  ? acetaminophen (TYLENOL) 500 MG tablet Take 1,000 mg by mouth every 6 (six) hours as needed for mild pain.    ? apixaban (ELIQUIS) 5 MG TABS tablet Take 1 tablet (5 mg total) by mouth 2 (two) times daily. 180 tablet 3  ? buPROPion (WELLBUTRIN XL) 300 MG 24 hr tablet Take 1 tablet by mouth daily.    ? Calcium Carb-Cholecalciferol (CALCIUM + D3 PO) Take 1 tablet by mouth every morning.    ? Coenzyme Q10 (COQ10) 200 MG CAPS Take 200 mg by mouth daily.    ? dexlansoprazole (DEXILANT) 60 MG capsule TAKE 1 CAPSULE(60 MG) BY MOUTH DAILY 90 capsule 3  ? fexofenadine (ALLEGRA) 180 MG tablet Take 180 mg by mouth daily.    ? furosemide (LASIX) 20 MG tablet Take 2 tablets (40 mg total) by mouth daily. Make take an add. Tab. If needed 90 tablet 3  ? nitroGLYCERIN (NITROSTAT) 0.4 MG SL tablet PLACE 1 TABLET UNDER THE TONGUE EVERY 5 MINUTES AS NEEDED FOR CHEST PAIN 25 tablet 3  ? rosuvastatin (CRESTOR) 10 MG tablet TAKE 1 TABLET(10 MG) BY MOUTH DAILY 90 tablet 1  ? sacubitril-valsartan (ENTRESTO) 24-26 MG Take 1  tablet by mouth 2 (two) times daily. 60 tablet 11  ? sotalol (BETAPACE) 160 MG tablet TAKE 1 TABLET BY MOUTH EVERY MORNING AND 1/2 TABLET EVERY EVENING 90 tablet 0  ? spironolactone (ALDACTONE) 25 MG tablet Tak

## 2021-07-02 NOTE — Patient Instructions (Signed)
There has been no changes to your mediations. ? ?Labs done today, your results will be available in MyChart, we will contact you for abnormal readings. ? ?Your physician recommends that you schedule a follow-up appointment in: 3 months. ? ?If you have any questions or concerns before your next appointment please send [place] a message through [place] or call our office at [phone].   ? ?TO LEAVE A MESSAGE FOR THE NURSE SELECT OPTION 2, PLEASE LEAVE A MESSAGE INCLUDING: ?YOUR NAME ?DATE OF BIRTH ?CALL BACK NUMBER ?REASON FOR CALL**this is important as we prioritize the call backs ? ?YOU WILL RECEIVE A CALL BACK THE SAME DAY AS LONG AS YOU CALL BEFORE 4:00 PM ? ?At the [facility], you and your health needs are our priority. As part of our continuing mission to provide you with exceptional heart care, we have created designated Provider Care Teams. These Care Teams include your primary Cardiologist (physician) and Advanced Practice Providers (APPs- Physician Assistants and Nurse Practitioners) who all work together to provide you with the care you need, when you need it.  ? ?You may see any of the following providers on your designated Care Team at your next follow up: ?Dr Arvilla Meres ?Dr Marca Ancona ?Tonye Becket, NP ?Robbie Lis, PA ?Jessica Milford,NP ?Anna Genre, PA ?Karle Plumber, PharmD ? ? ?Please be sure to bring in all your medications bottles to every appointment.  ? ? ?

## 2021-07-03 LAB — MISC LABCORP TEST (SEND OUT): Labcorp test code: 143000

## 2021-07-04 NOTE — Progress Notes (Signed)
Remote ICD transmission.   

## 2021-07-24 ENCOUNTER — Other Ambulatory Visit (HOSPITAL_COMMUNITY): Payer: Self-pay | Admitting: Cardiology

## 2021-08-02 ENCOUNTER — Other Ambulatory Visit (HOSPITAL_COMMUNITY): Payer: Self-pay

## 2021-08-02 MED ORDER — SOTALOL HCL 160 MG PO TABS: ORAL_TABLET | ORAL | 1 refills | Status: DC

## 2021-09-17 ENCOUNTER — Ambulatory Visit (INDEPENDENT_AMBULATORY_CARE_PROVIDER_SITE_OTHER): Payer: BC Managed Care – PPO

## 2021-09-17 DIAGNOSIS — I255 Ischemic cardiomyopathy: Secondary | ICD-10-CM | POA: Diagnosis not present

## 2021-09-17 LAB — CUP PACEART REMOTE DEVICE CHECK
Battery Remaining Longevity: 58 mo
Battery Remaining Percentage: 54 %
Battery Voltage: 2.95 V
Brady Statistic RV Percent Paced: 1 %
Date Time Interrogation Session: 20230725025103
HighPow Impedance: 88 Ohm
HighPow Impedance: 88 Ohm
Implantable Lead Implant Date: 20171129
Implantable Lead Location: 753860
Implantable Pulse Generator Implant Date: 20171129
Lead Channel Impedance Value: 1025 Ohm
Lead Channel Pacing Threshold Amplitude: 1.25 V
Lead Channel Pacing Threshold Pulse Width: 0.5 ms
Lead Channel Sensing Intrinsic Amplitude: 12 mV
Lead Channel Setting Pacing Amplitude: 2.5 V
Lead Channel Setting Pacing Pulse Width: 0.5 ms
Lead Channel Setting Sensing Sensitivity: 0.5 mV
Pulse Gen Serial Number: 7377983

## 2021-10-04 ENCOUNTER — Ambulatory Visit (HOSPITAL_COMMUNITY)
Admission: RE | Admit: 2021-10-04 | Discharge: 2021-10-04 | Disposition: A | Discharge status: Home / Self Care | Payer: BC Managed Care – PPO | Source: Ambulatory Visit | Attending: Cardiology | Admitting: Cardiology

## 2021-10-04 ENCOUNTER — Encounter (HOSPITAL_COMMUNITY): Payer: Self-pay | Admitting: Cardiology

## 2021-10-04 VITALS — BP 100/78 | HR 53 | Wt 147.0 lb

## 2021-10-04 DIAGNOSIS — Z01818 Encounter for other preprocedural examination: Secondary | ICD-10-CM | POA: Insufficient documentation

## 2021-10-04 DIAGNOSIS — I5022 Chronic systolic (congestive) heart failure: Secondary | ICD-10-CM | POA: Diagnosis not present

## 2021-10-04 DIAGNOSIS — E785 Hyperlipidemia, unspecified: Secondary | ICD-10-CM

## 2021-10-04 LAB — BASIC METABOLIC PANEL
Anion gap: 8 (ref 5–15)
BUN: 16 mg/dL (ref 6–20)
CO2: 28 mmol/L (ref 22–32)
Calcium: 9.2 mg/dL (ref 8.9–10.3)
Chloride: 103 mmol/L (ref 98–111)
Creatinine, Ser: 1.02 mg/dL — ABNORMAL HIGH (ref 0.44–1.00)
GFR, Estimated: >60 mL/min (ref >60)
Glucose, Bld: 102 mg/dL — ABNORMAL HIGH (ref 70–99)
Potassium: 3.8 mmol/L (ref 3.5–5.1)
Sodium: 139 mmol/L (ref 135–145)

## 2021-10-04 LAB — LIPID PANEL
Cholesterol: 135 mg/dL (ref 0–200)
HDL: 51 mg/dL (ref >40)
LDL Cholesterol: 62 mg/dL (ref 0–99)
Total CHOL/HDL Ratio: 2.6 RATIO
Triglycerides: 108 mg/dL (ref <150)
VLDL: 22 mg/dL (ref 0–40)

## 2021-10-04 LAB — CBC
HCT: 37.7 % (ref 36.0–46.0)
Hemoglobin: 12.7 g/dL (ref 12.0–15.0)
MCH: 30.3 pg (ref 26.0–34.0)
MCHC: 33.7 g/dL (ref 30.0–36.0)
MCV: 90 fL (ref 80.0–100.0)
Platelets: 201 10*3/uL (ref 150–400)
RBC: 4.19 MIL/uL (ref 3.87–5.11)
RDW: 12.5 % (ref 11.5–15.5)
WBC: 5.7 10*3/uL (ref 4.0–10.5)
nRBC: 0 % (ref 0.0–0.2)

## 2021-10-04 NOTE — Progress Notes (Signed)
\   ADVANCED HF CLINIC NOTE  PCP: Dr. Margo Aye HF Cardiology: Dr. Shirlee Latch  [name] is a 56 y.o. with history of CAD s/p anterior STEMI with DES to LAD, ischemic cardiomyopathy, mitral regurgitation, and paroxysmal atrial fibrillation.  She was admitted in 7/17 with anterior STEMI.  She had a late presentation to the cath lab.  Echo in 10/17 showed EF about 25% with LAD-territory wall motion abnormalities.  She had peri-MI atrial fibrillation and was started on Xarelto.  She was put on amiodarone.   After discharge, she continued to feel poorly.  She developed nausea, anorexia, and significant fatigue.  She was admitted for evaluation in 10/17 given concern for low output heart failure.  However, stopping amiodarone essentially resolved her symptoms.  She had RHC showing preserved cardiac output but the PCWP was high due to prominent v-waves, presumably from significant mitral regurgitation.  Of note, she was in atrial fibrillation for a time during this admission.   She had St Jude ICD placed.  She is back at work for the [employer].   TEE in 8/18 to evaluate the mitral valve showed EF 30%, moderate MR (probably functional).    Patient was seen by Dr. Elberta Fortis and noted to have very frequent PVCs on device interrogation.  Holter in 12/18 showed 41% PVCs.  She was started on sotalol 80 mg bid and titrated up to 120 mg bid.  Coreg was stopped with the addition of sotalol and losartan was decreased to 12.5 mg daily due to low BP.  She had PVC ablation in 2/19 but PVCs recurred. Sotalol was increased to 160 mg bid.   Echo 8/19 showed EF 30% with regional wall motion abnormalities, normal RV size and systolic function, moderate MR.   She was admitted in 10/19 with chest pain.  Cath showed patent LAD stent and no obstructive CAD.  She was found to be anemic with Fe deficiency (hgb down to 8).  FOBT+. She has had IV Fe and blood transfusion.  EGD and colonoscopy this year were fairly  unremarkable.  She had diverticulosis.  Capsule endoscopy negative in 9/21.   Echo in 1/22 showed EF 25-30%, wall motion abnormalities in LAD distribution, mild-moderate MR, normal RV.    Admitted 11/28/2020 with anemia, hemoglobin of 8.6 (baseline around 10-11). She was transfused 1 unit of PRBC on 11/29/2020. GI was consulted and she underwent EGD on 11/30/2020 which showed no source of bleeding.  Capsule endoscopy on 12/01/2020 showed blood in the mid/distal small bowel and proximal colon.  Colonoscopy on 12/02/2020 showed blood in the terminal ileum but no clear source.  It is suspected that the source of bleeding is in the distal small bowel that is not able to be reached via routine colonoscope.  GI asked IR to consider an arteriogram to localize and treat the source of her bleeding. CT done and felt best option was DBE with Dr. Theodore Demark at [place]. Double balloon endoscopy was done in 12/22, no definite cause of GI bleeding was found.   Echo in 3/23 showed EF 25-30%, apical akinesis, mild MR, normal RV, normal IVC.   She returns for followup of CHF.  She is lightheaded bending over and then standing up, but otherwise does ok.  Weight down 3 lbs.  Short of breath walking up stairs or long distances.  No chest pain.  No palpitations.  No orthopnea/PND.   ECG (personally reviewed): NSR, LAFB, IVCD QRS 136 msec, QTc 437  Labs (10/17): K  3.9, creatinine 1.24, hgb 10.3 Labs (11/17): K 4, creatinine 1.27 Labs (12/17): K 4.2, creatinine 1.12, BNP 439 Labs (2/18): K 4.7, creatinine 1.37 Labs (7/18): K 3.9, creatinine 1.14, hgb 11.7 Labs (12/18): K 4.5, creatinine 1.23 Labs (1/19): K 4.4, creatinine 1.14 Labs (2/19): LDL 97 (off atorvastatin), HDL 56 Labs (5/19): K 4.3, creatinine 1.1 Labs (10/19): K 4.2, creatinine 1.2, LDL 70, hgb 11.8 Labs (11/19): K 4.9, creatinine 1.17, hgb 11.8, LDL 68, low TSH  Labs (9/20): K 4.4, creatinine 1.34, LDL 52 Labs (12/20): K 4.6, creatinine 1.19 Labs (6/21): LDL 73, HDL  62, hgb 11.9, K 4.2, creatinine 1.14 Labs (7/21): hgb 11.2, K 3.8, creatinine 1.12 Labs (10/21): K 4.5, creatinine 1.38, LDL 53, HDL 50 Labs (1/22): K 4.9, creatinine 0.99 Labs (2/22): hgb 11.6, Fe studies normal Labs (5/22): K 5, creatinine 1.05, hgb 11.8 Labs (10/22): K 3.9, creatinine 1.02, hgb 7.1 Labs (11/22): hgb 11.9, K 3.7, creatinine 1.02, BNP 347 Labs (3/23): hgb 13.1 Labs (4/23): K 4.1, creatinine 1.16, pro-BNP 2121 Labs (5/23): LDL 71, TGs 74, K 4.1, creatinine 0.98  PMH:  1. CAD: Anterior STEMI in 7/17 with late presentation to the cath lab. She had DES to proximal LAD.  No significant disease in other vessels.  - LHC (10/19): Patent LAD stent, no obstructive disease.  2. Chronic systolic CHF: Ischemic cardiomyopathy.  - Echo 10/17 with EF 25%, akinesis of the mid-apical anteroseptal and anterior walls, apical dyskinesis, moderate to severe MR with incomplete coaptation.  - RHC (10/17): mean RA 4, PA 59/17 mean 38, mean PCWP 27 with prominent V waves suggesting significant MR, CI 3.06, PVR 2 WU.  - ACEI cough.  - Echo (11/17): EF 30-35%, normal RV size and systolic function, severe MR with restriction of posterior leaflet.   - Echo (8/19): EF 30% with regional wall motion abnormalities, normal RV size and systolic function, moderate MR.  - Echo (12/20): EF 30%, LAD wall motion abnormalities, normal RV, PASP 35, moderate functional MR.  - Echo (1/22): EF 25-30%, wall motion abnormalities in LAD distribution, mild-moderate MR, normal RV.  - Echo (3/23): EF 25-30%, apical akinesis, mild MR, normal RV, normal IVC.  3. Atrial fibrillation: Paroxysmal.  Noted 7/17 admission peri-MI, also noted again 10/17 admission. She did not tolerate amiodarone due to nausea/anorexia.  4. Mitral regurgitation: Moderate to severe on 10/17 echo, incomplete coaptation.  ?Etiology, her MI (anterior) does not typically lead to ischemic MR.  - Echo in 11/17 with severe MR, posterior leaflet restricted.   - TEE (8/18): EF 30%, moderately dilated LV with akinetic septal and anterior walls, moderate functional MR, normal RV size and systolic function.  - Echo (8/19): Moderate MR - Echo (3/23): Mild MR.  5. Hyperlipidemia: Myalgias with atorvastatin.  6. PVCs: Holter 12/18 with 41% PVCs.  - PVC ablation in 2/19 with recurrence of PVCs.  7. GI bleeding:  - EGD (4/21): unremarkable.  - Colonoscopy (7/21): Diverticulosis.  - Capsule endoscopy (9/21): negative.  - Double balloon endoscopy at [facility] in 12/22 did not show cause for bleeding.   SH: Married, lives in [place], works for the [employer], no smoking or ETOH.   Family History  Problem Relation Age of Onset   Heart attack Father    Arrhythmia Mother    Lung cancer Mother        bronchiectasis   Colon cancer Neg Hx    Colon polyps Neg Hx    ROS: All systems reviewed and negative except as  per HPI.   Current Outpatient Medications on File Prior to Encounter  Medication Sig Dispense Refill   acetaminophen (TYLENOL) 500 MG tablet Take 1,000 mg by mouth every 6 (six) hours as needed for mild pain.     apixaban (ELIQUIS) 5 MG TABS tablet Take 1 tablet (5 mg total) by mouth 2 (two) times daily. 180 tablet 3   buPROPion (WELLBUTRIN XL) 300 MG 24 hr tablet Take 1 tablet by mouth daily.     Calcium Carb-Cholecalciferol (CALCIUM + D3 PO) Take 1 tablet by mouth every morning.     Coenzyme Q10 (COQ10) 200 MG CAPS Take 200 mg by mouth daily.     dexlansoprazole (DEXILANT) 60 MG capsule TAKE 1 CAPSULE(60 MG) BY MOUTH DAILY 90 capsule 3   fexofenadine (ALLEGRA) 180 MG tablet Take 180 mg by mouth daily.     furosemide (LASIX) 20 MG tablet Take 2 tablets (40 mg total) by mouth daily. Make take an add. Tab. If needed 90 tablet 3   nitroGLYCERIN (NITROSTAT) 0.4 MG SL tablet PLACE 1 TABLET UNDER THE TONGUE EVERY 5 MINUTES AS NEEDED FOR CHEST PAIN 25 tablet 3   rosuvastatin (CRESTOR) 10 MG tablet TAKE 1 TABLET(10 MG) BY MOUTH DAILY 90 tablet 1    sacubitril-valsartan (ENTRESTO) 24-26 MG Take 1 tablet by mouth 2 (two) times daily. 60 tablet 11   sotalol (BETAPACE) 160 MG tablet TAKE 1 TABLET BY MOUTH EVERY MORNING AND 1/2 TABLET EVERY EVENING 126 tablet 1   spironolactone (ALDACTONE) 25 MG tablet Take 1 tablet (25 mg total) by mouth daily. 30 tablet 11   thyroid (ARMOUR) 120 MG tablet Take 120 mg by mouth daily before breakfast.     traZODone (DESYREL) 50 MG tablet Take 0.5 tablets (25 mg total) by mouth at bedtime as needed for sleep. 30 tablet 2   triamcinolone (NASACORT) 55 MCG/ACT AERO nasal inhaler Place 2 sprays into the nose daily.      No current facility-administered medications on file prior to encounter.   Wt Readings from Last 3 Encounters:  10/04/21 66.7 kg (147 lb)  07/02/21 68 kg (150 lb)  05/20/21 66.8 kg (147 lb 3.2 oz)   BP 100/78   Pulse (!) 53   Wt 66.7 kg (147 lb)   SpO2 99%   BMI 21.71 kg/m  General: NAD Neck: No JVD, no thyromegaly or thyroid nodule.  Lungs: Clear to auscultation bilaterally with normal respiratory effort. CV: Nondisplaced PMI.  Heart regular S1/S2, no S3/S4, no murmur.  No peripheral edema.  No carotid bruit.  Normal pedal pulses.  Abdomen: Soft, nontender, no hepatosplenomegaly, no distention.  Skin: Intact without lesions or rashes.  Neurologic: Alert and oriented x 3.  Psych: Normal affect. Extremities: No clubbing or cyanosis.  HEENT: Normal.   Assessment/Plan: 1. CAD: S/p anterior MI in 7/17 with DES to RCA.  Cath in 10/19 with no obstructive CAD.  No chest pain.  - She is off Plavix now and taking apixaban 5 mg bid.   - She is tolerating Crestor without myalgias.  Good lipids in 5/23.      2. Chronic systolic CHF: Ischemic cardiomyopathy.  EF 25% on echo in 10/17, 30-35% on echo in 11/17, 30% on TEE in 8/18. She has extensive LAD-territory scar. 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  Echo in 3/23 showed that EF remained 25-30% with LAD-territory scar.  She is not volume overloaded on exam,  still with NYHA class III symptoms with prominent fatigue.  Low  BP has made adjustment of GDMT difficult.  She denies significant lightheadedness currently.  Not volume overloaded on exam.  - Continue Lasix 40 mg daily. BMET today.  - Off Coreg with low BP and addition of sotalol.   - Continue Entresto 24/26 bid, no BP room to increase.  - She did not tolerate dapagliflozin due to yeast infections.   - Continue spironolactone 25 mg daily.   - QRS does not appear to be wide enough that she would have benefit from CRT.  - We will re-evaluate for Batwire, I think she would be a good candidate. Check pro-BNP again today.  If lower, can go in [place] trial.  If still high, will need to aim for commercial barostimulator.  We are still waiting to hear about this.  3. Mitral regurgitation: Moderate to severe on 10/17 echo, severe on 11/17 echo.  I did a TEE in 8/18.  This showed moderate MR that appeared to be functional.  No indication for surgery or Mitraclip. Echo in 3/23 showed mild MR.  4. Atrial fibrillation: Unable to tolerate amiodarone due to GI side effects.  She is in NSR today on Eliquis and sotalol. QTc interval is acceptable on sotalol on today's ECG.    5. PVCs: Very frequent on 12/18 holter (41% beats), she has had a PVC ablation in 2/19. Due to recurrence of PVCs post-ablation, she is now on sotalol. She does not seem to have many PVCs.  - Continue sotalol, QTc ok on ECG today.  6. Hyperlipidemia: Tolerating Crestor, good lipids.  7. GI bleeding: With anemia.  She had episode in 10/22, has not had overt recurrence. She has been considered for Watchman.   - She wants to hold off on Watchman for now, will to consider with a close recurrence of GI bleeding.  - Check CBC today.   Followup in 3 months.   Loralie Champagne 10/04/2021

## 2021-10-04 NOTE — Patient Instructions (Signed)
There has been no changes to your medications. ? ?Labs done today, your results will be available in MyChart, we will contact you for abnormal readings. ? ? ?Your physician recommends that you schedule a follow-up appointment in: 3 months.   ? ?If you have any questions or concerns before your next appointment please send us a message through mychart or call our office at [phone].   ? ?TO LEAVE A MESSAGE FOR THE NURSE SELECT OPTION 2, PLEASE LEAVE A MESSAGE INCLUDING: ?YOUR NAME ?DATE OF BIRTH ?CALL BACK NUMBER ?REASON FOR CALL**this is important as we prioritize the call backs ? ?YOU WILL RECEIVE A CALL BACK THE SAME DAY AS LONG AS YOU CALL BEFORE 4:00 PM ? ?At the [facility], you and your health needs are our priority. As part of our continuing mission to provide you with exceptional heart care, we have created designated Provider Care Teams. These Care Teams include your primary Cardiologist (physician) and Advanced Practice Providers (APPs- Physician Assistants and Nurse Practitioners) who all work together to provide you with the care you need, when you need it.  ? ?You may see any of the following providers on your designated Care Team at your next follow up: ?Dr Daniel Bensimhon ?Dr Dalton McLean ?Amy Clegg, NP ?Brittainy Simmons, PA ?Jessica Milford,NP ?Lindsay Finch, PA ?Lauren Kemp, PharmD ? ? ?Please be sure to bring in all your medications bottles to every appointment.  ? ? ?

## 2021-10-05 LAB — MISC LABCORP TEST (SEND OUT): Labcorp test code: 143000

## 2021-10-15 NOTE — Progress Notes (Signed)
Remote ICD transmission.   

## 2021-10-21 ENCOUNTER — Other Ambulatory Visit (HOSPITAL_COMMUNITY): Payer: Self-pay | Admitting: Cardiology

## 2021-10-22 ENCOUNTER — Encounter: Payer: Self-pay | Admitting: Cardiology

## 2021-10-30 ENCOUNTER — Telehealth: Payer: Self-pay | Admitting: *Deleted

## 2021-10-30 NOTE — Telephone Encounter (Signed)
Received approval letter for Dexilant. Scanned into chart.

## 2021-11-13 ENCOUNTER — Other Ambulatory Visit (HOSPITAL_COMMUNITY): Payer: Self-pay | Admitting: Cardiology

## 2021-11-18 ENCOUNTER — Other Ambulatory Visit (HOSPITAL_COMMUNITY): Payer: Self-pay

## 2021-11-18 MED ORDER — ROSUVASTATIN CALCIUM 10 MG PO TABS: ORAL_TABLET | ORAL | 3 refills | Status: DC

## 2021-12-04 ENCOUNTER — Other Ambulatory Visit: Payer: Self-pay | Admitting: Cardiology

## 2021-12-17 ENCOUNTER — Ambulatory Visit (INDEPENDENT_AMBULATORY_CARE_PROVIDER_SITE_OTHER): Payer: BC Managed Care – PPO

## 2021-12-17 DIAGNOSIS — I255 Ischemic cardiomyopathy: Secondary | ICD-10-CM | POA: Diagnosis not present

## 2021-12-19 LAB — CUP PACEART REMOTE DEVICE CHECK
Battery Remaining Longevity: 56 mo
Battery Remaining Percentage: 52 %
Battery Voltage: 2.93 V
Brady Statistic RV Percent Paced: 1 %
Date Time Interrogation Session: 20231024020015
HighPow Impedance: 80 Ohm
HighPow Impedance: 80 Ohm
Implantable Lead Connection Status: 753985
Implantable Lead Implant Date: 20171129
Implantable Lead Location: 753860
Implantable Pulse Generator Implant Date: 20171129
Lead Channel Impedance Value: 1650 Ohm
Lead Channel Pacing Threshold Amplitude: 1.25 V
Lead Channel Pacing Threshold Pulse Width: 0.5 ms
Lead Channel Sensing Intrinsic Amplitude: 12 mV
Lead Channel Setting Pacing Amplitude: 2.5 V
Lead Channel Setting Pacing Pulse Width: 0.5 ms
Lead Channel Setting Sensing Sensitivity: 0.5 mV
Pulse Gen Serial Number: 7377983
Zone Setting Status: 755011

## 2022-01-03 ENCOUNTER — Ambulatory Visit (HOSPITAL_COMMUNITY)
Admission: RE | Admit: 2022-01-03 | Discharge: 2022-01-03 | Disposition: A | Discharge status: Home / Self Care | Payer: BC Managed Care – PPO | Source: Ambulatory Visit | Attending: Cardiology | Admitting: Cardiology

## 2022-01-03 ENCOUNTER — Encounter (HOSPITAL_COMMUNITY): Payer: Self-pay | Admitting: Cardiology

## 2022-01-03 VITALS — BP 90/50 | HR 56 | Wt 159.6 lb

## 2022-01-03 DIAGNOSIS — I5022 Chronic systolic (congestive) heart failure: Secondary | ICD-10-CM | POA: Diagnosis present

## 2022-01-03 DIAGNOSIS — Z79899 Other long term (current) drug therapy: Secondary | ICD-10-CM | POA: Diagnosis not present

## 2022-01-03 DIAGNOSIS — I252 Old myocardial infarction: Secondary | ICD-10-CM | POA: Diagnosis not present

## 2022-01-03 DIAGNOSIS — E785 Hyperlipidemia, unspecified: Secondary | ICD-10-CM | POA: Insufficient documentation

## 2022-01-03 DIAGNOSIS — D500 Iron deficiency anemia secondary to blood loss (chronic): Secondary | ICD-10-CM | POA: Insufficient documentation

## 2022-01-03 DIAGNOSIS — I454 Nonspecific intraventricular block: Secondary | ICD-10-CM | POA: Diagnosis not present

## 2022-01-03 DIAGNOSIS — I480 Paroxysmal atrial fibrillation: Secondary | ICD-10-CM

## 2022-01-03 DIAGNOSIS — I255 Ischemic cardiomyopathy: Secondary | ICD-10-CM | POA: Diagnosis not present

## 2022-01-03 DIAGNOSIS — Z95810 Presence of automatic (implantable) cardiac defibrillator: Secondary | ICD-10-CM | POA: Diagnosis not present

## 2022-01-03 DIAGNOSIS — I2510 Atherosclerotic heart disease of native coronary artery without angina pectoris: Secondary | ICD-10-CM | POA: Diagnosis not present

## 2022-01-03 DIAGNOSIS — I340 Nonrheumatic mitral (valve) insufficiency: Secondary | ICD-10-CM | POA: Insufficient documentation

## 2022-01-03 DIAGNOSIS — Z7901 Long term (current) use of anticoagulants: Secondary | ICD-10-CM | POA: Insufficient documentation

## 2022-01-03 LAB — BASIC METABOLIC PANEL
Anion gap: 10 (ref 5–15)
BUN: 17 mg/dL (ref 6–20)
CO2: 29 mmol/L (ref 22–32)
Calcium: 9.3 mg/dL (ref 8.9–10.3)
Chloride: 100 mmol/L (ref 98–111)
Creatinine, Ser: 1.17 mg/dL — ABNORMAL HIGH (ref 0.44–1.00)
GFR, Estimated: 55 mL/min — ABNORMAL LOW (ref >60)
Glucose, Bld: 107 mg/dL — ABNORMAL HIGH (ref 70–99)
Potassium: 4 mmol/L (ref 3.5–5.1)
Sodium: 139 mmol/L (ref 135–145)

## 2022-01-03 NOTE — Patient Instructions (Signed)
There has been no changes to your medications.  Labs done today, your results will be available in MyChart, we will contact you for abnormal readings.  You have been referred to Dr. Lalla Brothers. His office will call you to arrange the appointment.  Your physician recommends that you schedule a follow-up appointment in: 3 months.  If you have any questions or concerns before your next appointment please send [place] a message through Globe or call our office at [phone].    TO LEAVE A MESSAGE FOR THE NURSE SELECT OPTION 2, PLEASE LEAVE A MESSAGE INCLUDING: YOUR NAME DATE OF BIRTH CALL BACK NUMBER REASON FOR CALL**this is important as we prioritize the call backs  YOU WILL RECEIVE A CALL BACK THE SAME DAY AS LONG AS YOU CALL BEFORE 4:00 PM  At the [facility], you and your health needs are our priority. As part of our continuing mission to provide you with exceptional heart care, we have created designated Provider Care Teams. These Care Teams include your primary Cardiologist (physician) and Advanced Practice Providers (APPs- Physician Assistants and Nurse Practitioners) who all work together to provide you with the care you need, when you need it.   You may see any of the following providers on your designated Care Team at your next follow up: Dr Arvilla Meres Dr Marca Ancona Dr. Marcos Eke, NP Robbie Lis, [place] [facility] [place], [place] Brynda Peon, NP Karle Plumber, PharmD   Please be sure to bring in all your medications bottles to every appointment.

## 2022-01-05 NOTE — Progress Notes (Signed)
\   ADVANCED HF CLINIC NOTE  PCP: Dr. Margo Aye HF Cardiology: Dr. Shirlee Latch  [name] is a 56 y.o. with history of CAD s/p anterior STEMI with DES to LAD, ischemic cardiomyopathy, mitral regurgitation, and paroxysmal atrial fibrillation.  She was admitted in 7/17 with anterior STEMI.  She had a late presentation to the cath lab.  Echo in 10/17 showed EF about 25% with LAD-territory wall motion abnormalities.  She had peri-MI atrial fibrillation and was started on Xarelto.  She was put on amiodarone.   After discharge, she continued to feel poorly.  She developed nausea, anorexia, and significant fatigue.  She was admitted for evaluation in 10/17 given concern for low output heart failure.  However, stopping amiodarone essentially resolved her symptoms.  She had RHC showing preserved cardiac output but the PCWP was high due to prominent v-waves, presumably from significant mitral regurgitation.  Of note, she was in atrial fibrillation for a time during this admission.   She had St Jude ICD placed.  She is back at work for the [employer].   TEE in 8/18 to evaluate the mitral valve showed EF 30%, moderate MR (probably functional).    Patient was seen by Dr. Elberta Fortis and noted to have very frequent PVCs on device interrogation.  Holter in 12/18 showed 41% PVCs.  She was started on sotalol 80 mg bid and titrated up to 120 mg bid.  Coreg was stopped with the addition of sotalol and losartan was decreased to 12.5 mg daily due to low BP.  She had PVC ablation in 2/19 but PVCs recurred. Sotalol was increased to 160 mg bid.   Echo 8/19 showed EF 30% with regional wall motion abnormalities, normal RV size and systolic function, moderate MR.   She was admitted in 10/19 with chest pain.  Cath showed patent LAD stent and no obstructive CAD.  She was found to be anemic with Fe deficiency (hgb down to 8).  FOBT+. She has had IV Fe and blood transfusion.  EGD and colonoscopy this year were fairly  unremarkable.  She had diverticulosis.  Capsule endoscopy negative in 9/21.   Echo in 1/22 showed EF 25-30%, wall motion abnormalities in LAD distribution, mild-moderate MR, normal RV.    Admitted 11/28/2020 with anemia, hemoglobin of 8.6 (baseline around 10-11). She was transfused 1 unit of PRBC on 11/29/2020. GI was consulted and she underwent EGD on 11/30/2020 which showed no source of bleeding.  Capsule endoscopy on 12/01/2020 showed blood in the mid/distal small bowel and proximal colon.  Colonoscopy on 12/02/2020 showed blood in the terminal ileum but no clear source.  It is suspected that the source of bleeding is in the distal small bowel that is not able to be reached via routine colonoscope.  GI asked IR to consider an arteriogram to localize and treat the source of her bleeding. CT done and felt best option was DBE with Dr. Theodore Demark at [place]. Double balloon endoscopy was done in 12/22, no definite cause of GI bleeding was found.   Echo in 3/23 showed EF 25-30%, apical akinesis, mild MR, normal RV, normal IVC.   She returns for followup of CHF.  BP remains low but she denies lightheadedness unless she stands too fast or bends over for a long time then stands up.  Weight is up, she attributes this to adjustments in her thyroid medications recently.  No significant exertional dyspnea.  No orthopnea/PND.  No chest pain. Fatigues easily.   ECG (personally reviewed):  NSR, IVCD QRS 130 msec, QTc 418 msec  St Jude device interrogation: No VT  Labs (10/17): K 3.9, creatinine 1.24, hgb 10.3 Labs (11/17): K 4, creatinine 1.27 Labs (12/17): K 4.2, creatinine 1.12, BNP 439 Labs (2/18): K 4.7, creatinine 1.37 Labs (7/18): K 3.9, creatinine 1.14, hgb 11.7 Labs (12/18): K 4.5, creatinine 1.23 Labs (1/19): K 4.4, creatinine 1.14 Labs (2/19): LDL 97 (off atorvastatin), HDL 56 Labs (5/19): K 4.3, creatinine 1.1 Labs (10/19): K 4.2, creatinine 1.2, LDL 70, hgb 11.8 Labs (11/19): K 4.9, creatinine 1.17, hgb  11.8, LDL 68, low TSH  Labs (9/20): K 4.4, creatinine 1.34, LDL 52 Labs (12/20): K 4.6, creatinine 1.19 Labs (6/21): LDL 73, HDL 62, hgb 11.9, K 4.2, creatinine 1.14 Labs (7/21): hgb 11.2, K 3.8, creatinine 1.12 Labs (10/21): K 4.5, creatinine 1.38, LDL 53, HDL 50 Labs (1/22): K 4.9, creatinine 0.99 Labs (2/22): hgb 11.6, Fe studies normal Labs (5/22): K 5, creatinine 1.05, hgb 11.8 Labs (10/22): K 3.9, creatinine 1.02, hgb 7.1 Labs (11/22): hgb 11.9, K 3.7, creatinine 1.02, BNP 347 Labs (3/23): hgb 13.1 Labs (4/23): K 4.1, creatinine 1.16, pro-BNP 2121 Labs (5/23): LDL 71, TGs 74, K 4.1, creatinine 0.98 Labs (8/23): LDL 62, NT-proBNP 2843, K 3.8, creatinine 1.02  PMH:  1. CAD: Anterior STEMI in 7/17 with late presentation to the cath lab. She had DES to proximal LAD.  No significant disease in other vessels.  - LHC (10/19): Patent LAD stent, no obstructive disease.  2. Chronic systolic CHF: Ischemic cardiomyopathy.  - Echo 10/17 with EF 25%, akinesis of the mid-apical anteroseptal and anterior walls, apical dyskinesis, moderate to severe MR with incomplete coaptation.  - RHC (10/17): mean RA 4, PA 59/17 mean 38, mean PCWP 27 with prominent V waves suggesting significant MR, CI 3.06, PVR 2 WU.  - ACEI cough.  - Echo (11/17): EF 30-35%, normal RV size and systolic function, severe MR with restriction of posterior leaflet.   - Echo (8/19): EF 30% with regional wall motion abnormalities, normal RV size and systolic function, moderate MR.  - Echo (12/20): EF 30%, LAD wall motion abnormalities, normal RV, PASP 35, moderate functional MR.  - Echo (1/22): EF 25-30%, wall motion abnormalities in LAD distribution, mild-moderate MR, normal RV.  - Echo (3/23): EF 25-30%, apical akinesis, mild MR, normal RV, normal IVC.  3. Atrial fibrillation: Paroxysmal.  Noted 7/17 admission peri-MI, also noted again 10/17 admission. She did not tolerate amiodarone due to nausea/anorexia.  4. Mitral  regurgitation: Moderate to severe on 10/17 echo, incomplete coaptation.  ?Etiology, her MI (anterior) does not typically lead to ischemic MR.  - Echo in 11/17 with severe MR, posterior leaflet restricted.  - TEE (8/18): EF 30%, moderately dilated LV with akinetic septal and anterior walls, moderate functional MR, normal RV size and systolic function.  - Echo (8/19): Moderate MR - Echo (3/23): Mild MR.  5. Hyperlipidemia: Myalgias with atorvastatin.  6. PVCs: Holter 12/18 with 41% PVCs.  - PVC ablation in 2/19 with recurrence of PVCs.  7. GI bleeding:  - EGD (4/21): unremarkable.  - Colonoscopy (7/21): Diverticulosis.  - Capsule endoscopy (9/21): negative.  - Double balloon endoscopy at [facility] in 12/22 did not show cause for bleeding.   SH: Married, lives in [place], works for the [employer], no smoking or ETOH.   Family History  Problem Relation Age of Onset   Heart attack Father    Arrhythmia Mother    Lung cancer Mother  bronchiectasis   Colon cancer Neg Hx    Colon polyps Neg Hx    ROS: All systems reviewed and negative except as per HPI.   Current Outpatient Medications on File Prior to Encounter  Medication Sig Dispense Refill   acetaminophen (TYLENOL) 500 MG tablet Take 1,000 mg by mouth every 6 (six) hours as needed for mild pain.     apixaban (ELIQUIS) 5 MG TABS tablet Take 1 tablet (5 mg total) by mouth 2 (two) times daily. 180 tablet 3   buPROPion (WELLBUTRIN XL) 300 MG 24 hr tablet Take 1 tablet by mouth daily.     Calcium Carb-Cholecalciferol (CALCIUM + D3 PO) Take 1 tablet by mouth every morning.     Co-Enzyme Q10 100 MG CAPS Take 100 mg by mouth daily.     dexlansoprazole (DEXILANT) 60 MG capsule TAKE 1 CAPSULE(60 MG) BY MOUTH DAILY 90 capsule 3   ENTRESTO 24-26 MG TAKE 1 TABLET BY MOUTH TWICE DAILY 60 tablet 11   fexofenadine (ALLEGRA) 180 MG tablet Take 180 mg by mouth daily.     furosemide (LASIX) 20 MG tablet TAKE 2 TABLETS(40 MG) BY MOUTH DAILY 90 tablet 3    nitroGLYCERIN (NITROSTAT) 0.4 MG SL tablet PLACE 1 TABLET UNDER THE TONGUE EVERY 5 MINUTES AS NEEDED FOR CHEST PAIN 25 tablet 3   rosuvastatin (CRESTOR) 10 MG tablet TAKE 1 TABLET(10 MG) BY MOUTH DAILY 90 tablet 3   sotalol (BETAPACE) 160 MG tablet TAKE 1 TABLET BY MOUTH EVERY MORNING AND 1/2 TABLET EVERY EVENING 126 tablet 1   spironolactone (ALDACTONE) 25 MG tablet Take 1 tablet (25 mg total) by mouth daily. 30 tablet 11   thyroid (ARMOUR) 120 MG tablet Take 120 mg by mouth daily before breakfast.     traZODone (DESYREL) 50 MG tablet Take 0.5 tablets (25 mg total) by mouth at bedtime as needed for sleep. 30 tablet 2   triamcinolone (NASACORT) 55 MCG/ACT AERO nasal inhaler Place 2 sprays into the nose daily.      No current facility-administered medications on file prior to encounter.   Wt Readings from Last 3 Encounters:  01/03/22 72.4 kg (159 lb 9.6 oz)  10/04/21 66.7 kg (147 lb)  07/02/21 68 kg (150 lb)   BP (!) 90/50   Pulse (!) 56   Wt 72.4 kg (159 lb 9.6 oz)   SpO2 99%   BMI 23.57 kg/m  General: NAD Neck: No JVD, no thyromegaly or thyroid nodule.  Lungs: Clear to auscultation bilaterally with normal respiratory effort. CV: Nondisplaced PMI.  Heart regular S1/S2, no S3/S4, no murmur.  No peripheral edema.  No carotid bruit.  Normal pedal pulses.  Abdomen: Soft, nontender, no hepatosplenomegaly, no distention.  Skin: Intact without lesions or rashes.  Neurologic: Alert and oriented x 3.  Psych: Normal affect. Extremities: No clubbing or cyanosis.  HEENT: Normal.   Assessment/Plan: 1. CAD: S/p anterior MI in 7/17 with DES to RCA.  Cath in 10/19 with no obstructive CAD.  No chest pain.  - She is off Plavix now and taking apixaban 5 mg bid.   - She is tolerating Crestor without myalgias.  Good lipids in 8/23.      2. Chronic systolic CHF: Ischemic cardiomyopathy.  EF 25% on echo in 10/17, 30-35% on echo in 11/17, 30% on TEE in 8/18. She has extensive LAD-territory scar. She has  a [employer] ICD.  Echo in 3/23 showed that EF remained 25-30% with LAD-territory scar.  She is not  volume overloaded on exam, still with NYHA class III symptoms with prominent fatigue.  Low BP has made adjustment of GDMT difficult.  Not volume overloaded on exam.  - Continue Lasix 40 mg daily. BMET today.  - Off Coreg with low BP and addition of sotalol.   - Continue Entresto 24/26 bid, no BP room to increase.  - She did not tolerate dapagliflozin due to yeast infections.   - Continue spironolactone 25 mg daily.   - QRS does not appear to be wide enough that she would have benefit from CRT.  - Insurance did not approve her for baroreceptor activation therapy.   - I will see if I can get her approved for cardiac contractility modulation, will send to Dr. Lalla Brothers.   3. Mitral regurgitation: Moderate to severe on 10/17 echo, severe on 11/17 echo.  I did a TEE in 8/18.  This showed moderate MR that appeared to be functional.  No indication for surgery or Mitraclip. Echo in 3/23 showed mild MR.  4. Atrial fibrillation: Unable to tolerate amiodarone due to GI side effects.  She is in NSR today on Eliquis and sotalol. QTc interval is acceptable on sotalol on today's ECG.    5. PVCs: Very frequent on 12/18 holter (41% beats), she has had a PVC ablation in 2/19. Due to recurrence of PVCs post-ablation, she is now on sotalol. She does not seem to have many PVCs.  - Continue sotalol, QTc ok today.   6. Hyperlipidemia: Tolerating Crestor, good lipids in 8/23.  7. GI bleeding: With anemia.  She had episode in 10/22, has not had overt recurrence. She has been considered for Watchman.   - She wants to hold off on Watchman for now, will to consider with a close recurrence of GI bleeding.   Followup in 3 months APP.   Marca Ancona 01/05/2022

## 2022-01-06 NOTE — Progress Notes (Signed)
Remote ICD transmission.   

## 2022-02-05 ENCOUNTER — Other Ambulatory Visit (HOSPITAL_COMMUNITY): Payer: Self-pay | Admitting: Cardiology

## 2022-02-26 ENCOUNTER — Encounter: Payer: Self-pay | Admitting: Cardiology

## 2022-02-26 ENCOUNTER — Ambulatory Visit: Payer: BC Managed Care – PPO | Attending: Cardiology | Admitting: Cardiology

## 2022-02-26 VITALS — BP 118/72 | HR 50 | Ht 69.0 in | Wt 161.8 lb

## 2022-02-26 DIAGNOSIS — I493 Ventricular premature depolarization: Secondary | ICD-10-CM | POA: Diagnosis not present

## 2022-02-26 DIAGNOSIS — D6869 Other thrombophilia: Secondary | ICD-10-CM

## 2022-02-26 DIAGNOSIS — I480 Paroxysmal atrial fibrillation: Secondary | ICD-10-CM

## 2022-02-26 DIAGNOSIS — I5022 Chronic systolic (congestive) heart failure: Secondary | ICD-10-CM | POA: Diagnosis not present

## 2022-02-26 DIAGNOSIS — I2510 Atherosclerotic heart disease of native coronary artery without angina pectoris: Secondary | ICD-10-CM | POA: Diagnosis not present

## 2022-02-26 DIAGNOSIS — Z79899 Other long term (current) drug therapy: Secondary | ICD-10-CM

## 2022-02-26 LAB — CUP PACEART INCLINIC DEVICE CHECK
Battery Remaining Longevity: 54 mo
Brady Statistic RV Percent Paced: 0.02 %
Date Time Interrogation Session: 20240103165034
HighPow Impedance: 79.875
Implantable Lead Connection Status: 753985
Implantable Lead Implant Date: 20171129
Implantable Lead Location: 753860
Implantable Pulse Generator Implant Date: 20171129
Lead Channel Impedance Value: 1925 Ohm
Lead Channel Pacing Threshold Amplitude: 7.5 V
Lead Channel Pacing Threshold Amplitude: 7.5 V
Lead Channel Pacing Threshold Pulse Width: 0.5 ms
Lead Channel Pacing Threshold Pulse Width: 0.5 ms
Lead Channel Sensing Intrinsic Amplitude: 12 mV
Lead Channel Setting Pacing Amplitude: 2.5 V
Lead Channel Setting Pacing Pulse Width: 0.5 ms
Lead Channel Setting Sensing Sensitivity: 0.5 mV
Pulse Gen Serial Number: 7377983
Zone Setting Status: 755011

## 2022-02-26 NOTE — Progress Notes (Signed)
Electrophysiology Office Note   Date:  02/26/2022   ID:  Catherine Mays, DOB [birth year], MRN [id number]  PCP:  Celene Squibb, MD  Cardiologist:  Rubye Beach Primary Electrophysiologist:  Constance Haw, MD    No chief complaint on file.     History of Present Illness: Catherine Mays is a 57 y.o. female who presents today for electrophysiology evaluation.     The history significant for coronary artery disease post STEMI in 2017 with drug-eluting stent to the mid LAD, ischemic cardiomyopathy, chronic systolic heart failure, paroxysmal atrial fibrillation at the time of STEMI.  She presented to [facility] with chest pain and was sent home.  She came back later and was noted to have anterior ST elevations.  She was transferred to the hospital and had DES to the occluded mid LAD 09/20/2015.  She was discharged with a LifeVest.  She is post [place] ICD implanted 01/23/2016.  She was noted to have an elevated PVC burden.  She is post PVC ablation.  She had a decrease in her PVCs but was started on sotalol.  She was admitted to the hospital 11/28/2020 with anemia and was found to have a GI bleed.  She has undergone capsule endoscopy and double-balloon endoscopy with no definite source.  Today, denies symptoms of palpitations, chest pain, shortness of breath, orthopnea, PND, lower extremity edema, claudication, dizziness, presyncope, syncope, bleeding, or neurologic sequela. The patient is tolerating medications without difficulties.  She feels well today.  She has an upcoming appointment for cardiac contractility modulation with Dr. Quentin Ore.   Past Medical History:  Diagnosis Date   AICD (automatic cardioverter/defibrillator) present    [employer] Jude   CAD in native artery    a. late recognition of presentation of STEMI 08/2015 s/p DES to LAD, mild residual mRCA.   Chronic systolic CHF (congestive heart failure) (HCC)    CKD (chronic kidney disease), stage III (HCC)     Hypotension    a. preventing med titration for HF.   Hypothyroidism 09/24/2015   Ischemic cardiomyopathy    Myocardial infarction [facility]) 08/2015   PAF (paroxysmal atrial fibrillation) ([place]) 09/24/2015   a. dx at time of STEMI 08/2015.   Pre-diabetes    Presence of permanent cardiac pacemaker    patient has ICD   PVC's (premature ventricular contractions)    Past Surgical History:  Procedure Laterality Date   BIOPSY  06/16/2019   Procedure: BIOPSY;  Surgeon: Daneil Dolin, MD;  Location: [facility];  Service: Endoscopy;;   CARDIAC CATHETERIZATION N/A 09/20/2015   Procedure: Left Heart Cath and Coronary Angiography;  Surgeon: Burnell Blanks, MD;  Location: [place] CV LAB;  Service: Cardiovascular;  Laterality: N/A;   CARDIAC CATHETERIZATION N/A 09/20/2015   Procedure: Coronary Stent Intervention;  Surgeon: Burnell Blanks, MD;  Location: [place] CV LAB;  Service: Cardiovascular;  Laterality: N/A;   CARDIAC CATHETERIZATION N/A 09/21/2015   Procedure: Left Heart Cath and Coronary Angiography;  Surgeon: Sherren Mocha, MD;  Location: [place] CV LAB;  Service: Cardiovascular;  Laterality: N/A;   CARDIAC CATHETERIZATION N/A 11/26/2015   Procedure: Right Heart Cath;  Surgeon: Larey Dresser, MD;  Location: [place] CV LAB;  Service: Cardiovascular;  Laterality: N/A;   COLONOSCOPY WITH PROPOFOL N/A 11/23/2017   Dr. Gala Romney: Diverticulosis, internal hemorrhoids, next colonoscopy in 10 years   COLONOSCOPY WITH PROPOFOL N/A 08/10/2019   Procedure: COLONOSCOPY WITH PROPOFOL;  Surgeon: Daneil Dolin, MD;  Diverticulosis in  sigmoid colon, nonbleeding internal hemorrhoids, otherwise normal exam.     COLONOSCOPY WITH PROPOFOL N/A 12/02/2020   Procedure: COLONOSCOPY WITH PROPOFOL;  Surgeon: Jeani Hawking, MD;  Location: [facility] ENDOSCOPY;  Service: Endoscopy;  Laterality: N/A;   CORONARY STENT PLACEMENT  09/20/2015   EP IMPLANTABLE DEVICE N/A 01/23/2016   Procedure: ICD Implant;   Surgeon: Marylou Wages Jorja Loa, MD;  Location: [facility] INVASIVE CV LAB;  Service: Cardiovascular;  Laterality: N/A;   ESOPHAGOGASTRODUODENOSCOPY (EGD) WITH PROPOFOL N/A 06/16/2019   Procedure: ESOPHAGOGASTRODUODENOSCOPY (EGD) WITH PROPOFOL;  Surgeon: Corbin Ade, MD;  Normal esophagus, small hiatal hernia, normal examined stomach, normal examined duodenum.  Gastric biopsy with slight chronic inflammation, duodenal biopsy with slight intramucosal Brunner gland hyperplasia.   ESOPHAGOGASTRODUODENOSCOPY (EGD) WITH PROPOFOL N/A 11/30/2020   Procedure: ESOPHAGOGASTRODUODENOSCOPY (EGD) WITH PROPOFOL;  Surgeon: Jenel Lucks, MD;  Location: [facility] ENDOSCOPY;  Service: Gastroenterology;  Laterality: N/A;   GIVENS CAPSULE STUDY N/A 11/23/2019    Surgeon: Corbin Ade, MD; essentially unremarkable except for tiny erosion in the proximal small bowel.   GIVENS CAPSULE STUDY  11/30/2020   Procedure: GIVENS CAPSULE STUDY;  Surgeon: Jenel Lucks, MD;  Location: [facility] ENDOSCOPY;  Service: Gastroenterology;;   LEFT HEART CATH AND CORONARY ANGIOGRAPHY N/A 12/18/2017   Procedure: LEFT HEART CATH AND CORONARY ANGIOGRAPHY;  Surgeon: [name], MD;  Location: [facility] INVASIVE CV LAB;  Service: Cardiovascular;  Laterality: N/A;   PVC ABLATION N/A 04/02/2017   Procedure: PVC ABLATION;  Surgeon: Regan Lemming, MD;  Location: [facility] INVASIVE CV LAB;  Service: Cardiovascular;  Laterality: N/A;   TEE WITHOUT CARDIOVERSION N/A 10/08/2016   Procedure: TRANSESOPHAGEAL ECHOCARDIOGRAM (TEE);  Surgeon: [name], MD;  Location: [facility] ENDOSCOPY;  Service: Cardiovascular;  Laterality: N/A;   THYROIDECTOMY       Current Outpatient Medications  Medication Sig Dispense Refill   acetaminophen (TYLENOL) 500 MG tablet Take 1,000 mg by mouth every 6 (six) hours as needed for mild pain.     buPROPion (WELLBUTRIN XL) 300 MG 24 hr tablet Take 1 tablet by mouth daily.     Calcium Carb-Cholecalciferol (CALCIUM + D3 PO) Take 1 tablet  by mouth every morning.     Co-Enzyme Q10 100 MG CAPS Take 100 mg by mouth daily.     dexlansoprazole (DEXILANT) 60 MG capsule TAKE 1 CAPSULE(60 MG) BY MOUTH DAILY 90 capsule 3   ELIQUIS 5 MG TABS tablet TAKE 1 TABLET(5 MG) BY MOUTH TWICE DAILY 180 tablet 3   ENTRESTO 24-26 MG TAKE 1 TABLET BY MOUTH TWICE DAILY 60 tablet 11   escitalopram (LEXAPRO) 10 MG tablet Take 1 tablet by mouth daily.     fexofenadine (ALLEGRA) 180 MG tablet Take 180 mg by mouth daily.     furosemide (LASIX) 20 MG tablet TAKE 2 TABLETS(40 MG) BY MOUTH DAILY 90 tablet 3   nitroGLYCERIN (NITROSTAT) 0.4 MG SL tablet PLACE 1 TABLET UNDER THE TONGUE EVERY 5 MINUTES AS NEEDED FOR CHEST PAIN 25 tablet 3   rosuvastatin (CRESTOR) 10 MG tablet TAKE 1 TABLET(10 MG) BY MOUTH DAILY 90 tablet 3   sotalol (BETAPACE) 160 MG tablet TAKE 1 TABLET BY MOUTH EVERY MORNING AND 1/2 TABLET EVERY EVENING 126 tablet 1   spironolactone (ALDACTONE) 25 MG tablet Take 1 tablet (25 mg total) by mouth daily. 30 tablet 11   thyroid (ARMOUR) 120 MG tablet Take 120 mg by mouth daily before breakfast.     traZODone (DESYREL) 50 MG tablet Take 0.5 tablets (  25 mg total) by mouth at bedtime as needed for sleep. 30 tablet 2   triamcinolone (NASACORT) 55 MCG/ACT AERO nasal inhaler Place 2 sprays into the nose daily.      No current facility-administered medications for this visit.    Allergies:   Paxil [paroxetine], Morphine and related, Percocet [oxycodone-acetaminophen], Cefdinir, and Zolpidem   Social History:  The patient  reports that she has never smoked. She has never used smokeless tobacco. She reports that she does not drink alcohol and does not use drugs.   Family History:  The patient's family history includes Arrhythmia in her mother; Heart attack in her father; Lung cancer in her mother.   ROS:  Please see the history of present illness.   Otherwise, review of systems is positive for none.   All other systems are reviewed and negative.    PHYSICAL EXAM: VS:  BP 118/72   Pulse (!) 50   Ht 5\' 9"  (1.753 m)   Wt 161 lb 12.8 oz (73.4 kg)   SpO2 98%   BMI 23.89 kg/m  , BMI Body mass index is 23.89 kg/m. GEN: Well nourished, well developed, in no acute distress  HEENT: normal  Neck: no JVD, carotid bruits, or masses Cardiac: RRR; no murmurs, rubs, or gallops,no edema  Respiratory:  clear to auscultation bilaterally, normal work of breathing GI: soft, nontender, nondistended, + BS MS: no deformity or atrophy  Skin: warm and dry, device site well healed Neuro:  Strength and sensation are intact Psych: euthymic mood, full affect  EKG:  EKG is ordered today. Personal review of the ekg ordered shows sinus rhythm, anterior infarct, rate 50  Personal review of the device interrogation today. Results in [facility]    Recent Labs: 05/29/2021: NT-Pro BNP 2,121 10/04/2021: Hemoglobin 12.7; Platelets 201 01/03/2022: BUN 17; Creatinine, Ser 1.17; Potassium 4.0; Sodium 139    Lipid Panel     Component Value Date/Time   CHOL 135 10/04/2021 1112   TRIG 108 10/04/2021 1112   HDL 51 10/04/2021 1112   CHOLHDL 2.6 10/04/2021 1112   VLDL 22 10/04/2021 1112   LDLCALC 62 10/04/2021 1112     Wt Readings from Last 3 Encounters:  02/26/22 161 lb 12.8 oz (73.4 kg)  01/03/22 159 lb 9.6 oz (72.4 kg)  10/04/21 147 lb (66.7 kg)      Other studies Reviewed: Additional studies/ records that were reviewed today include: TEE 10/05/17 Review of the above records today demonstrates:  - Left ventricle: The cavity size was mildly dilated. Wall   thickness was normal. The estimated ejection fraction was 30%.   Akinesis of the inferoseptum and anteroseptum. Apical inferior   and apical anterior akinesis. Akinesis of the apex. Features are   consistent with a pseudonormal left ventricular filling pattern,   with concomitant abnormal relaxation and increased filling   pressure (grade 2 diastolic dysfunction). - Aortic valve: There was no  stenosis. - Mitral valve: There was moderate regurgitation. - Left atrium: The atrium was moderately dilated. - Right ventricle: The cavity size was normal. Pacer wire or   catheter noted in right ventricle. Systolic function was normal. - Tricuspid valve: Peak RV-RA gradient (S): 34 mm Hg. - Pulmonary arteries: PA peak pressure: 37 mm Hg (S). - Inferior vena cava: The vessel was normal in size. The   respirophasic diameter changes were in the normal range (>= 50%),   consistent with normal central venous pressure.  Holter 01/26/17 - personally reviewed Minimum HR: 58 BPM  at 4:28:50 AM(2) Maximum HR: 113 BPM at 10:19:38 AM Average HR: 86 BPM Ventricular Beats: [id number] (41%) Sinus rhythm with PVCs and atrial runs   LHC 12/18/17 No obstructive coronary disease, patent LAD stent.   ASSESSMENT AND PLAN:  1..  Coronary artery disease: No current chest pain.  Continue with current management  2.  Paroxysmal atrial fibrillation: Currently on Eliquis 5 mg twice daily.  CHA2DS2-VASc of 3.  Minimal episodes.  No changes.  3.  Chronic systolic heart failure: Due to ischemic cardiomyopathy.  Status post [place] ICD.  Lead impedance and threshold has significantly gone up.  High-voltage impedance is normal.  This is a primary prevention ICD, therapy has been turned off as there are no possibility of discriminators.  Gavriel Holzhauer have her evaluated at her appointment Dr. Quentin Ore for RV lead explant and reimplantation.  She may benefit from a dual-chamber device due to bradycardia.  4.  Nonrheumatic mitral regurgitation: Likely functional due to ischemic cardiomyopathy.  No changes.  5.  PVCs: Monitor with a 41% burden.  Ablation of the posterior medial papillary muscle with decreased burden.  Currently on sotalol, dose above.  Significant reduction in burden.  No changes.  6.  Secondary hypercoagulable state: Currently on Eliquis for atrial fibrillation as above  7.  High risk medication monitoring:  Currently on sotalol for PVCs.  QT interval remained stable.  Current medicines are reviewed at length with the patient today.   The patient does not have concerns regarding her medicines.  The following changes were made today: none  Labs/ tests ordered today include:  Orders Placed This Encounter  Procedures   EKG 12-Lead      Disposition:   FU 6 months  Signed, Emlyn Maves Meredith Leeds, MD  02/26/2022 4:21 PM     [place] [address] [phone] (office) [phone] (fax)

## 2022-03-18 ENCOUNTER — Ambulatory Visit: Payer: BC Managed Care – PPO | Attending: Cardiology

## 2022-03-18 DIAGNOSIS — I255 Ischemic cardiomyopathy: Secondary | ICD-10-CM | POA: Diagnosis not present

## 2022-03-18 LAB — CUP PACEART REMOTE DEVICE CHECK
Battery Remaining Longevity: 56 mo
Battery Remaining Percentage: 50 %
Battery Voltage: 2.92 V
Brady Statistic RV Percent Paced: 1 %
Date Time Interrogation Session: 20240123020024
HighPow Impedance: 79 Ohm
HighPow Impedance: 79 Ohm
Implantable Lead Connection Status: 753985
Implantable Lead Implant Date: 20171129
Implantable Lead Location: 753860
Implantable Pulse Generator Implant Date: 20171129
Lead Channel Impedance Value: 1900 Ohm
Lead Channel Pacing Threshold Amplitude: 7.5 V
Lead Channel Pacing Threshold Pulse Width: 0.5 ms
Lead Channel Sensing Intrinsic Amplitude: 12 mV
Lead Channel Setting Pacing Amplitude: 2.5 V
Lead Channel Setting Pacing Pulse Width: 0.5 ms
Lead Channel Setting Sensing Sensitivity: 0.5 mV
Pulse Gen Serial Number: 7377983

## 2022-03-21 ENCOUNTER — Encounter: Payer: Self-pay | Admitting: Cardiology

## 2022-03-21 ENCOUNTER — Ambulatory Visit: Payer: BC Managed Care – PPO | Attending: Cardiology | Admitting: Cardiology

## 2022-03-21 VITALS — BP 94/68 | HR 59 | Ht 69.0 in | Wt 161.0 lb

## 2022-03-21 DIAGNOSIS — Z95810 Presence of automatic (implantable) cardiac defibrillator: Secondary | ICD-10-CM

## 2022-03-21 DIAGNOSIS — I5022 Chronic systolic (congestive) heart failure: Secondary | ICD-10-CM | POA: Diagnosis not present

## 2022-03-21 DIAGNOSIS — T82198A Other mechanical complication of other cardiac electronic device, initial encounter: Secondary | ICD-10-CM | POA: Diagnosis not present

## 2022-03-21 DIAGNOSIS — I480 Paroxysmal atrial fibrillation: Secondary | ICD-10-CM

## 2022-03-21 DIAGNOSIS — Z79899 Other long term (current) drug therapy: Secondary | ICD-10-CM

## 2022-03-21 DIAGNOSIS — I493 Ventricular premature depolarization: Secondary | ICD-10-CM

## 2022-03-21 LAB — CUP PACEART INCLINIC DEVICE CHECK
Battery Remaining Longevity: 55 mo
Brady Statistic RV Percent Paced: 0.02 %
Date Time Interrogation Session: 20240126155144
HighPow Impedance: 75 Ohm
HighPow Impedance: 75.375
Implantable Lead Connection Status: 753985
Implantable Lead Implant Date: 20171129
Implantable Lead Location: 753860
Implantable Pulse Generator Implant Date: 20171129
Lead Channel Impedance Value: 1900 Ohm
Lead Channel Pacing Threshold Amplitude: 4.75 V
Lead Channel Pacing Threshold Amplitude: 4.75 V
Lead Channel Pacing Threshold Pulse Width: 0.5 ms
Lead Channel Pacing Threshold Pulse Width: 0.5 ms
Lead Channel Sensing Intrinsic Amplitude: 12 mV
Lead Channel Setting Pacing Amplitude: 2.5 V
Lead Channel Setting Pacing Pulse Width: 0.5 ms
Lead Channel Setting Sensing Sensitivity: 0.5 mV
Pulse Gen Serial Number: 7377983

## 2022-03-21 NOTE — Patient Instructions (Addendum)
Medication Instructions:  Your physician recommends that you continue on your current medications as directed. Please refer to the Current Medication list given to you today.  *If you need a refill on your cardiac medications before your next appointment, please call your pharmacy*   Lab Work: TODAY: BMET and CBC If you have labs (blood work) drawn today and your tests are completely normal, you will receive your results only by: [place] (if you have MyChart) OR A paper copy in the mail If you have any lab test that is abnormal or we need to change your treatment, we will call you to review the results.   Testing/Procedures: Non-Cardiac CT scanning, (CAT scanning), is a noninvasive, special x-ray that produces cross-sectional images of the body using x-rays and a computer. CT scans help physicians diagnose and treat medical conditions. For some CT exams, a contrast material is used to enhance visibility in the area of the body being studied. CT scans provide greater clarity and reveal more details than regular x-ray exams.   Follow-Up: At [facility], you and your health needs are our priority.  As part of our continuing mission to provide you with exceptional heart care, we have created designated Provider Care Teams.  These Care Teams include your primary Cardiologist (physician) and Advanced Practice Providers (APPs -  Physician Assistants and Nurse Practitioners) who all work together to provide you with the care you need, when you need it.  We will be in touch with you to schedule your lead extraction and follow up appointments.

## 2022-03-21 NOTE — Progress Notes (Deleted)
Electrophysiology Office Follow up Visit Note:    Date:  03/21/2022   ID:  Catherine Mays, DOB 04-11-1965, MRN [id number]  PCP:  Celene Squibb, MD  [facility] Cardiologist:  Lauree Chandler, MD  [place] Electrophysiologist:  Will Meredith Leeds, MD    Interval History:    Catherine Mays is a 57 y.o. female who presents for a follow up visit.  She has been seen in the past by Dr. Curt Bears.  She has a history of coronary artery disease with a STEMI in 2017.  She has a chronic systolic heart failure and frequent PVCs.  She had a Environmental health practitioner ICD implanted in November 2017.  She takes sotalol for her PVCs and has had a prior PVC ablation.  She has been referred by the heart failure team for consideration of cardiac contractility modulation.  Her RV lead impedance and pacing threshold have increased significantly on monitoring.  She is being referred to discuss extraction and reimplant of her ICD lead.  Tachycardia therapies are off for her ICD.  Her ICD is a Environmental health practitioner system with a [place] lead implanted January 23, 2016.       Past Medical History:  Diagnosis Date   AICD (automatic cardioverter/defibrillator) present    St. Jude   CAD in native artery    a. late recognition of presentation of STEMI 08/2015 s/p DES to LAD, mild residual mRCA.   Chronic systolic CHF (congestive heart failure) (HCC)    CKD (chronic kidney disease), stage III (HCC)    Hypotension    a. preventing med titration for HF.   Hypothyroidism 09/24/2015   Ischemic cardiomyopathy    Myocardial infarction [facility]) 08/2015   PAF (paroxysmal atrial fibrillation) ([place]) 09/24/2015   a. dx at time of STEMI 08/2015.   Pre-diabetes    Presence of permanent cardiac pacemaker    patient has ICD   PVC's (premature ventricular contractions)     Past Surgical History:  Procedure Laterality Date   BIOPSY  06/16/2019   Procedure: BIOPSY;  Surgeon: Daneil Dolin, MD;  [facility];   Service: Endoscopy;;   CARDIAC CATHETERIZATION N/A 09/20/2015   Procedure: Left Heart Cath and Coronary Angiography;  Surgeon: Burnell Blanks, MD;  Location: [place] CV LAB;  Service: Cardiovascular;  Laterality: N/A;   CARDIAC CATHETERIZATION N/A 09/20/2015   Procedure: Coronary Stent Intervention;  Surgeon: Burnell Blanks, MD;  Location: [place] CV LAB;  Service: Cardiovascular;  Laterality: N/A;   CARDIAC CATHETERIZATION N/A 09/21/2015   Procedure: Left Heart Cath and Coronary Angiography;  Surgeon: Sherren Mocha, MD;  Location: [place] CV LAB;  Service: Cardiovascular;  Laterality: N/A;   CARDIAC CATHETERIZATION N/A 11/26/2015   Procedure: Right Heart Cath;  Surgeon: Larey Dresser, MD;  Location: [place] CV LAB;  Service: Cardiovascular;  Laterality: N/A;   COLONOSCOPY WITH PROPOFOL N/A 11/23/2017   Dr. Gala Romney: Diverticulosis, internal hemorrhoids, next colonoscopy in 10 years   COLONOSCOPY WITH PROPOFOL N/A 08/10/2019   Procedure: COLONOSCOPY WITH PROPOFOL;  Surgeon: Daneil Dolin, MD;  Diverticulosis in sigmoid colon, nonbleeding internal hemorrhoids, otherwise normal exam.     COLONOSCOPY WITH PROPOFOL N/A 12/02/2020   Procedure: COLONOSCOPY WITH PROPOFOL;  Surgeon: Carol Ada, MD;  Location: [place];  Service: Endoscopy;  Laterality: N/A;   CORONARY STENT PLACEMENT  09/20/2015   EP IMPLANTABLE DEVICE N/A 01/23/2016   Procedure: ICD Implant;  Surgeon: Will Meredith Leeds, MD;  Location: [place]  CV LAB;  Service: Cardiovascular;  Laterality: N/A;   ESOPHAGOGASTRODUODENOSCOPY (EGD) WITH PROPOFOL N/A 06/16/2019   Procedure: ESOPHAGOGASTRODUODENOSCOPY (EGD) WITH PROPOFOL;  Surgeon: Daneil Dolin, MD;  Normal esophagus, small hiatal hernia, normal examined stomach, normal examined duodenum.  Gastric biopsy with slight chronic inflammation, duodenal biopsy with slight intramucosal Brunner gland hyperplasia.   ESOPHAGOGASTRODUODENOSCOPY (EGD) WITH  PROPOFOL N/A 11/30/2020   Procedure: ESOPHAGOGASTRODUODENOSCOPY (EGD) WITH PROPOFOL;  Surgeon: Daryel November, MD;  Location: [place];  Service: Gastroenterology;  Laterality: N/A;   GIVENS CAPSULE STUDY N/A 11/23/2019    Surgeon: Daneil Dolin, MD; essentially unremarkable except for tiny erosion in the proximal small bowel.   GIVENS CAPSULE STUDY  11/30/2020   Procedure: GIVENS CAPSULE STUDY;  Surgeon: Daryel November, MD;  Location: [facility] ENDOSCOPY;  Service: Gastroenterology;;   LEFT HEART CATH AND CORONARY ANGIOGRAPHY N/A 12/18/2017   Procedure: LEFT HEART CATH AND CORONARY ANGIOGRAPHY;  Surgeon: Larey Dresser, MD;  Location: [place] CV LAB;  Service: Cardiovascular;  Laterality: N/A;   PVC ABLATION N/A 04/02/2017   Procedure: PVC ABLATION;  Surgeon: Constance Haw, MD;  Location: [place] CV LAB;  Service: Cardiovascular;  Laterality: N/A;   TEE WITHOUT CARDIOVERSION N/A 10/08/2016   Procedure: TRANSESOPHAGEAL ECHOCARDIOGRAM (TEE);  Surgeon: Larey Dresser, MD;  Location: [facility] ENDOSCOPY;  Service: Cardiovascular;  Laterality: N/A;   THYROIDECTOMY      Current Medications: No outpatient medications have been marked as taking for the 03/21/22 encounter (Appointment) with Catherine Epley, MD.     Allergies:   Paxil [paroxetine], Morphine and related, Percocet [oxycodone-acetaminophen], Cefdinir, and Zolpidem   Social History   Socioeconomic History   Marital status: Married    Spouse name: Not on file   Number of children: Not on file   Years of education: Not on file   Highest education level: Not on file  Occupational History   Not on file  Tobacco Use   Smoking status: Never   Smokeless tobacco: Never  Vaping Use   Vaping Use: Never used  Substance and Sexual Activity   Alcohol use: No   Drug use: No   Sexual activity: Not on file  Other Topics Concern   Not on file  Social History Narrative   Not on file   Social Determinants of Health    Financial Resource Strain: Low Risk  (02/10/2020)   Overall Financial Resource Strain (CARDIA)    Difficulty of Paying Living Expenses: Not hard at all  Food Insecurity: No Food Insecurity (02/10/2020)   Hunger Vital Sign    Worried About Running Out of Food in the Last Year: Never true    [place] in the Last Year: Never true  Transportation Needs: No Transportation Needs (02/10/2020)   PRAPARE - Hydrologist (Medical): No    Lack of Transportation (Non-Medical): No  Physical Activity: Insufficiently Active (02/10/2020)   Exercise Vital Sign    Days of Exercise per Week: 3 days    Minutes of Exercise per Session: 30 min  Stress: No Stress Concern Present (02/10/2020)   [place]    Feeling of Stress : Not at all  Social Connections: [place] (02/10/2020)   Social Connection and Isolation Panel [NHANES]    Frequency of Communication with Friends and Family: More than three times a week    Frequency of Social Gatherings with Friends and Family: More than  three times a week    Attends Religious Services: More than 4 times per year    Active Member of Clubs or Organizations: Yes    Attends Engineer, structural: More than 4 times per year    Marital Status: Married     Family History: The patient's family history includes Arrhythmia in her mother; Heart attack in her father; Lung cancer in her mother. There is no history of Colon cancer or Colon polyps.  ROS:   Please see the history of present illness.    All other systems reviewed and are negative.  EKGs/Labs/Other Studies Reviewed:    The following studies were reviewed today:  March 21, 2022 in clinic device interrogation personally reviewed ***    Recent Labs: 05/29/2021: NT-Pro BNP 2,121 10/04/2021: Hemoglobin 12.7; Platelets 201 01/03/2022: BUN 17; Creatinine, Ser 1.17; Potassium 4.0; Sodium  139  Recent Lipid Panel    Component Value Date/Time   CHOL 135 10/04/2021 1112   TRIG 108 10/04/2021 1112   HDL 51 10/04/2021 1112   CHOLHDL 2.6 10/04/2021 1112   VLDL 22 10/04/2021 1112   LDLCALC 62 10/04/2021 1112    Physical Exam:    VS:  There were no vitals taken for this visit.    Wt Readings from Last 3 Encounters:  02/26/22 161 lb 12.8 oz (73.4 kg)  01/03/22 159 lb 9.6 oz (72.4 kg)  10/04/21 147 lb (66.7 kg)     GEN: *** Well nourished, well developed in no acute distress CARDIAC: ***RRR, no murmurs, rubs, gallops.  ICD pocket well-healed RESPIRATORY:  Clear to auscultation without rales, wheezing or rhonchi        ASSESSMENT:    No diagnosis found. PLAN:    In order of problems listed above:   #Chronic systolic heart failure #ICD #ICD lead malfunction The patient has chronic systolic heart failure secondary to his an ischemic cardiomyopathy.  She also has frequent PVCs.  Her ICD lead is no longer functional with an elevated pacing impedance and capture threshold.  She follows with Dr. Shirlee Latch.  Given persistently reduced ejection fraction while on GDMT, she is referred to discuss CCM.  I discussed the lead extraction procedure in detail including the risks and recovery.  We discussed the need for cardiothoracic surgical backup.  The patient has an ischemic CM (EF 25%), NYHA Class III CHF, and CAD.  He is referred by Dr Shirlee Latch and [facility] for risk stratification of sudden death and consideration of ICD explant and reimplant due to a RV lead malfunction.  I have had a thorough discussion with the patient reviewing options.  The patient and their family (if available) have had opportunities to ask questions and have them answered. The patient and I have decided together through a shared decision making process to proceed with ICD extraction and implant at this time.    Risks, benefits, alternatives to ICD extraction and reimplant were discussed in detail with  the patient today. The patient understands that the risks include but are not limited to bleeding, infection, pneumothorax, perforation, tamponade, vascular damage, renal failure, MI, stroke, death, inappropriate shocks, and lead dislodgement.  She also understands her risk of severe bleeding requiring emergent open heart surgery and wishes to proceed.    We need to revise her ICD system first and let this heal.  We will plan to meet back in clinic after she is healed from the lead extraction and reimplant to discuss CCM therapy in more detail.  The CCM  implant should be done under light sedation to confirm no stimulation during CCM therapy.     Medication Adjustments/Labs and Tests Ordered: Current medicines are reviewed at length with the patient today.  Concerns regarding medicines are outlined above.  No orders of the defined types were placed in this encounter.  No orders of the defined types were placed in this encounter.    Signed, Steffanie Dunn, MD, [facility], [facility] 03/21/2022 5:42 AM    Electrophysiology [facility]

## 2022-03-21 NOTE — Progress Notes (Signed)
Electrophysiology Office Follow up Visit Note:    Date:  03/21/2022   ID:  Catherine Mays, DOB [birth year], MRN [id number]  PCP:  Celene Squibb, MD  [facility] Cardiologist:  Lauree Chandler, MD  [place] Electrophysiologist:  Will Meredith Leeds, MD    Interval History:    Catherine Mays is a 57 y.o. female who presents for a follow up visit.  She has been seen in the past by Dr. Curt Bears.  She has a history of coronary artery disease with a STEMI in 2017.  She has a chronic systolic heart failure and frequent PVCs.  She had a Environmental health practitioner ICD implanted in November 2017.  She takes sotalol for her PVCs and has had a prior PVC ablation.  She has been referred by the heart failure team for consideration of cardiac contractility modulation.  Her RV lead impedance and pacing threshold have increased significantly on monitoring.  She is being referred to discuss extraction and reimplant of her ICD lead.  Tachycardia therapies are off for her ICD.  Her ICD is a Environmental health practitioner system with a [place] lead implanted January 23, 2016.     Today, she is accompanied by a family member. Overall she appears well at this time.  She is compliant with her Eliquis. No bleeding issues.  She denies any palpitations, chest pain, shortness of breath, or peripheral edema. No lightheadedness, headaches, syncope, orthopnea, or PND.      Past Medical History:  Diagnosis Date   AICD (automatic cardioverter/defibrillator) present    St. Jude   CAD in native artery    a. late recognition of presentation of STEMI 08/2015 s/p DES to LAD, mild residual mRCA.   Chronic systolic CHF (congestive heart failure) (HCC)    CKD (chronic kidney disease), stage III (HCC)    Hypotension    a. preventing med titration for HF.   Hypothyroidism 09/24/2015   Ischemic cardiomyopathy    Myocardial infarction [facility]) 08/2015   PAF (paroxysmal atrial fibrillation) ([place]) 09/24/2015   a. dx at time of STEMI  08/2015.   Pre-diabetes    Presence of permanent cardiac pacemaker    patient has ICD   PVC's (premature ventricular contractions)     Past Surgical History:  Procedure Laterality Date   BIOPSY  06/16/2019   Procedure: BIOPSY;  Surgeon: Daneil Dolin, MD;  [facility];  Service: Endoscopy;;   CARDIAC CATHETERIZATION N/A 09/20/2015   Procedure: Left Heart Cath and Coronary Angiography;  Surgeon: Burnell Blanks, MD;  Location: [place] CV LAB;  Service: Cardiovascular;  Laterality: N/A;   CARDIAC CATHETERIZATION N/A 09/20/2015   Procedure: Coronary Stent Intervention;  Surgeon: Burnell Blanks, MD;  Location: [place] CV LAB;  Service: Cardiovascular;  Laterality: N/A;   CARDIAC CATHETERIZATION N/A 09/21/2015   Procedure: Left Heart Cath and Coronary Angiography;  Surgeon: Sherren Mocha, MD;  Location: [place] CV LAB;  Service: Cardiovascular;  Laterality: N/A;   CARDIAC CATHETERIZATION N/A 11/26/2015   Procedure: Right Heart Cath;  Surgeon: Larey Dresser, MD;  Location: [place] CV LAB;  Service: Cardiovascular;  Laterality: N/A;   COLONOSCOPY WITH PROPOFOL N/A 11/23/2017   Dr. Gala Romney: Diverticulosis, internal hemorrhoids, next colonoscopy in 10 years   COLONOSCOPY WITH PROPOFOL N/A 08/10/2019   Procedure: COLONOSCOPY WITH PROPOFOL;  Surgeon: Daneil Dolin, MD;  Diverticulosis in sigmoid colon, nonbleeding internal hemorrhoids, otherwise normal exam.     COLONOSCOPY WITH PROPOFOL N/A 12/02/2020  Procedure: COLONOSCOPY WITH PROPOFOL;  Surgeon: Carol Ada, MD;  Location: [place];  Service: Endoscopy;  Laterality: N/A;   CORONARY STENT PLACEMENT  09/20/2015   EP IMPLANTABLE DEVICE N/A 01/23/2016   Procedure: ICD Implant;  Surgeon: Will Meredith Leeds, MD;  Location: [place] CV LAB;  Service: Cardiovascular;  Laterality: N/A;   ESOPHAGOGASTRODUODENOSCOPY (EGD) WITH PROPOFOL N/A 06/16/2019   Procedure: ESOPHAGOGASTRODUODENOSCOPY (EGD) WITH  PROPOFOL;  Surgeon: Daneil Dolin, MD;  Normal esophagus, small hiatal hernia, normal examined stomach, normal examined duodenum.  Gastric biopsy with slight chronic inflammation, duodenal biopsy with slight intramucosal Brunner gland hyperplasia.   ESOPHAGOGASTRODUODENOSCOPY (EGD) WITH PROPOFOL N/A 11/30/2020   Procedure: ESOPHAGOGASTRODUODENOSCOPY (EGD) WITH PROPOFOL;  Surgeon: Daryel November, MD;  Location: [place];  Service: Gastroenterology;  Laterality: N/A;   GIVENS CAPSULE STUDY N/A 11/23/2019    Surgeon: Daneil Dolin, MD; essentially unremarkable except for tiny erosion in the proximal small bowel.   GIVENS CAPSULE STUDY  11/30/2020   Procedure: GIVENS CAPSULE STUDY;  Surgeon: Daryel November, MD;  Location: [facility] ENDOSCOPY;  Service: Gastroenterology;;   LEFT HEART CATH AND CORONARY ANGIOGRAPHY N/A 12/18/2017   Procedure: LEFT HEART CATH AND CORONARY ANGIOGRAPHY;  Surgeon: Larey Dresser, MD;  Location: [place] CV LAB;  Service: Cardiovascular;  Laterality: N/A;   PVC ABLATION N/A 04/02/2017   Procedure: PVC ABLATION;  Surgeon: Constance Haw, MD;  Location: [place] CV LAB;  Service: Cardiovascular;  Laterality: N/A;   TEE WITHOUT CARDIOVERSION N/A 10/08/2016   Procedure: TRANSESOPHAGEAL ECHOCARDIOGRAM (TEE);  Surgeon: Larey Dresser, MD;  Location: [facility] ENDOSCOPY;  Service: Cardiovascular;  Laterality: N/A;   THYROIDECTOMY      Current Medications: Current Meds  Medication Sig   acetaminophen (TYLENOL) 500 MG tablet Take 1,000 mg by mouth every 6 (six) hours as needed for mild pain.   buPROPion (WELLBUTRIN XL) 300 MG 24 hr tablet Take 1 tablet by mouth daily.   Calcium Carb-Cholecalciferol (CALCIUM + D3 PO) Take 1 tablet by mouth every morning.   Co-Enzyme Q10 100 MG CAPS Take 100 mg by mouth daily.   dexlansoprazole (DEXILANT) 60 MG capsule TAKE 1 CAPSULE(60 MG) BY MOUTH DAILY   ELIQUIS 5 MG TABS tablet TAKE 1 TABLET(5 MG) BY MOUTH TWICE DAILY   ENTRESTO  24-26 MG TAKE 1 TABLET BY MOUTH TWICE DAILY   escitalopram (LEXAPRO) 10 MG tablet Take 1 tablet by mouth daily.   fexofenadine (ALLEGRA) 180 MG tablet Take 180 mg by mouth daily.   furosemide (LASIX) 20 MG tablet TAKE 2 TABLETS(40 MG) BY MOUTH DAILY   nitroGLYCERIN (NITROSTAT) 0.4 MG SL tablet PLACE 1 TABLET UNDER THE TONGUE EVERY 5 MINUTES AS NEEDED FOR CHEST PAIN   rosuvastatin (CRESTOR) 10 MG tablet TAKE 1 TABLET(10 MG) BY MOUTH DAILY   sotalol (BETAPACE) 160 MG tablet TAKE 1 TABLET BY MOUTH EVERY MORNING AND 1/2 TABLET EVERY EVENING   spironolactone (ALDACTONE) 25 MG tablet Take 1 tablet (25 mg total) by mouth daily.   thyroid (ARMOUR) 120 MG tablet Take 120 mg by mouth daily before breakfast.   traZODone (DESYREL) 50 MG tablet Take 0.5 tablets (25 mg total) by mouth at bedtime as needed for sleep.   triamcinolone (NASACORT) 55 MCG/ACT AERO nasal inhaler Place 2 sprays into the nose daily.      Allergies:   Paxil [paroxetine], Morphine and related, Percocet [oxycodone-acetaminophen], Cefdinir, and Zolpidem   Social History   Socioeconomic History   Marital status: Married  Spouse name: Not on file   Number of children: Not on file   Years of education: Not on file   Highest education level: Not on file  Occupational History   Not on file  Tobacco Use   Smoking status: Never   Smokeless tobacco: Never  Vaping Use   Vaping Use: Never used  Substance and Sexual Activity   Alcohol use: No   Drug use: No   Sexual activity: Not on file  Other Topics Concern   Not on file  Social History Narrative   Not on file   Social Determinants of Health   Financial Resource Strain: Low Risk  (02/10/2020)   Overall Financial Resource Strain (CARDIA)    Difficulty of Paying Living Expenses: Not hard at all  Food Insecurity: No Food Insecurity (02/10/2020)   Hunger Vital Sign    Worried About Running Out of Food in the Last Year: Never true    Ran Out of Food in the Last Year: Never  true  Transportation Needs: No Transportation Needs (02/10/2020)   PRAPARE - Hydrologist (Medical): No    Lack of Transportation (Non-Medical): No  Physical Activity: Insufficiently Active (02/10/2020)   Exercise Vital Sign    Days of Exercise per Week: 3 days    Minutes of Exercise per Session: 30 min  Stress: No Stress Concern Present (02/10/2020)   [place]    Feeling of Stress : Not at all  Social Connections: [place] (02/10/2020)   Social Connection and Isolation Panel [NHANES]    Frequency of Communication with Friends and Family: More than three times a week    Frequency of Social Gatherings with Friends and Family: More than three times a week    Attends Religious Services: More than 4 times per year    Active Member of [employer] or Organizations: Yes    Attends Music therapist: More than 4 times per year    Marital Status: Married     Family History: The patient's family history includes Arrhythmia in her mother; Heart attack in her father; Lung cancer in her mother. There is no history of Colon cancer or Colon polyps.  ROS:   Please see the history of present illness.    All other systems reviewed and are negative.  EKGs/Labs/Other Studies Reviewed:    The following studies were reviewed today:  March 21, 2022 in clinic device interrogation personally reviewed ICD therapies OFF upon arrival. I turned ON HV therapies with shocks only today. No ATP. RV capture threshold high.    Recent Labs: 05/29/2021: NT-Pro BNP 2,121 10/04/2021: Hemoglobin 12.7; Platelets 201 01/03/2022: BUN 17; Creatinine, Ser 1.17; Potassium 4.0; Sodium 139   Recent Lipid Panel    Component Value Date/Time   CHOL 135 10/04/2021 1112   TRIG 108 10/04/2021 1112   HDL 51 10/04/2021 1112   CHOLHDL 2.6 10/04/2021 1112   VLDL 22 10/04/2021 1112   LDLCALC 62 10/04/2021 1112     Physical Exam:    VS:  BP 94/68   Pulse (!) 59   Ht 5' 9"$  (1.753 m)   Wt 161 lb (73 kg)   SpO2 98%   BMI 23.78 kg/m     Wt Readings from Last 3 Encounters:  03/21/22 161 lb (73 kg)  02/26/22 161 lb 12.8 oz (73.4 kg)  01/03/22 159 lb 9.6 oz (72.4 kg)     GEN: Well nourished,  well developed in no acute distress CARDIAC: RRR, no murmurs, rubs, gallops.  ICD pocket well-healed. RESPIRATORY:  Clear to auscultation without rales, wheezing or rhonchi        ASSESSMENT:    1. Chronic systolic (congestive) heart failure ([place])   2. Implantable cardioverter-defibrillator (ICD) in situ   3. Malfunction of implantable defibrillator ventricular (ICD) lead   4. Paroxysmal atrial fibrillation (HCC)   5. PVC's (premature ventricular contractions)   6. Encounter for long-term (current) use of high-risk medication    PLAN:    In order of problems listed above:   #Chronic systolic heart failure #ICD #ICD lead malfunction The patient has chronic systolic heart failure secondary to her ischemic cardiomyopathy.  She also has frequent PVCs.  Her ICD lead is no longer functional with an elevated pacing impedance and capture threshold.  She follows with Dr. Aundra Dubin.  Given persistently reduced ejection fraction while on GDMT, she is referred to discuss CCM.  I discussed the lead extraction procedure in detail including the risks and recovery.  We discussed the need for cardiothoracic surgical backup.  The patient has an ischemic CM (EF 25%), NYHA Class III CHF, and CAD.  He is referred by Dr Aundra Dubin and [facility] for risk stratification of sudden death and consideration of ICD explant and reimplant due to a RV lead malfunction.  I have had a thorough discussion with the patient reviewing options.  The patient and their family (if available) have had opportunities to ask questions and have them answered. The patient and I have decided together through a shared decision making process to proceed  with ICD extraction and implant at this time.    Risks, benefits, alternatives to ICD extraction and reimplant were discussed in detail with the patient today. The patient understands that the risks include but are not limited to bleeding, infection, pneumothorax, perforation, tamponade, vascular damage, renal failure, MI, stroke, death, inappropriate shocks, and lead dislodgement.  She also understands her risk of severe bleeding requiring emergent open heart surgery and wishes to proceed.    We need to revise her ICD system first and let this heal.  We will plan to meet back in clinic after she is healed from the lead extraction and reimplant to discuss CCM therapy in more detail.  The CCM implant should be done under light sedation to confirm no stimulation during CCM therapy.  Plan for CT extraction protocol prior to procedure.  Medication Adjustments/Labs and Tests Ordered: Current medicines are reviewed at length with the patient today.  Concerns regarding medicines are outlined above.   No orders of the defined types were placed in this encounter.  No orders of the defined types were placed in this encounter.  I,Mathew Stumpf,acting as a Education administrator for Vickie Epley, MD.,have documented all relevant documentation on the behalf of Vickie Epley, MD,as directed by  Vickie Epley, MD while in the presence of Vickie Epley, MD.  I, Vickie Epley, MD, have reviewed all documentation for this visit. The documentation on 03/21/22 for the exam, diagnosis, procedures, and orders are all accurate and complete.   Signed, Lars Mage, MD, [facility], [facility] 03/21/2022 3:05 PM    Electrophysiology [facility]

## 2022-03-21 NOTE — H&P (View-Only) (Signed)
Electrophysiology Office Follow up Visit Note:    Date:  03/21/2022   ID:  Catherine Mays, DOB [birth year], MRN [id number]  PCP:  Celene Squibb, MD  [facility] Cardiologist:  Lauree Chandler, MD  [place] Electrophysiologist:  Will Meredith Leeds, MD    Interval History:    Catherine Mays is a 57 y.o. female who presents for a follow up visit.  She has been seen in the past by Dr. Curt Bears.  She has a history of coronary artery disease with a STEMI in 2017.  She has a chronic systolic heart failure and frequent PVCs.  She had a Environmental health practitioner ICD implanted in November 2017.  She takes sotalol for her PVCs and has had a prior PVC ablation.  She has been referred by the heart failure team for consideration of cardiac contractility modulation.  Her RV lead impedance and pacing threshold have increased significantly on monitoring.  She is being referred to discuss extraction and reimplant of her ICD lead.  Tachycardia therapies are off for her ICD.  Her ICD is a Environmental health practitioner system with a [place] lead implanted January 23, 2016.     Today, she is accompanied by a family member. Overall she appears well at this time.  She is compliant with her Eliquis. No bleeding issues.  She denies any palpitations, chest pain, shortness of breath, or peripheral edema. No lightheadedness, headaches, syncope, orthopnea, or PND.      Past Medical History:  Diagnosis Date   AICD (automatic cardioverter/defibrillator) present    [employer]   CAD in native artery    a. late recognition of presentation of STEMI 08/2015 s/p DES to LAD, mild residual mRCA.   Chronic systolic CHF (congestive heart failure) (HCC)    CKD (chronic kidney disease), stage III (HCC)    Hypotension    a. preventing med titration for HF.   Hypothyroidism 09/24/2015   Ischemic cardiomyopathy    Myocardial infarction [facility]) 08/2015   PAF (paroxysmal atrial fibrillation) ([place]) 09/24/2015   a. dx at time of STEMI  08/2015.   Pre-diabetes    Presence of permanent cardiac pacemaker    patient has ICD   PVC's (premature ventricular contractions)     Past Surgical History:  Procedure Laterality Date   BIOPSY  06/16/2019   Procedure: BIOPSY;  Surgeon: Daneil Dolin, MD;  [facility];  Service: Endoscopy;;   CARDIAC CATHETERIZATION N/A 09/20/2015   Procedure: Left Heart Cath and Coronary Angiography;  Surgeon: Burnell Blanks, MD;  Location: [place] [facility];  Service: Cardiovascular;  Laterality: N/A;   CARDIAC CATHETERIZATION N/A 09/20/2015   Procedure: Coronary Stent Intervention;  Surgeon: Burnell Blanks, MD;  Location: [place] [facility];  Service: Cardiovascular;  Laterality: N/A;   CARDIAC CATHETERIZATION N/A 09/21/2015   Procedure: Left Heart Cath and Coronary Angiography;  Surgeon: Sherren Mocha, MD;  Location: [place] [facility];  Service: Cardiovascular;  Laterality: N/A;   CARDIAC CATHETERIZATION N/A 11/26/2015   Procedure: Right Heart Cath;  Surgeon: Larey Dresser, MD;  Location: [place] [facility];  Service: Cardiovascular;  Laterality: N/A;   COLONOSCOPY WITH PROPOFOL N/A 11/23/2017   Dr. Gala Romney: Diverticulosis, internal hemorrhoids, next colonoscopy in 10 years   COLONOSCOPY WITH PROPOFOL N/A 08/10/2019   Procedure: COLONOSCOPY WITH PROPOFOL;  Surgeon: Daneil Dolin, MD;  Diverticulosis in sigmoid colon, nonbleeding internal hemorrhoids, otherwise normal exam.     COLONOSCOPY WITH PROPOFOL N/A 12/02/2020  Procedure: COLONOSCOPY WITH PROPOFOL;  Surgeon: Carol Ada, MD;  Location: [place];  Service: Endoscopy;  Laterality: N/A;   CORONARY STENT PLACEMENT  09/20/2015   EP IMPLANTABLE DEVICE N/A 01/23/2016   Procedure: ICD Implant;  Surgeon: Will Meredith Leeds, MD;  Location: [place] CV LAB;  Service: Cardiovascular;  Laterality: N/A;   ESOPHAGOGASTRODUODENOSCOPY (EGD) WITH PROPOFOL N/A 06/16/2019   Procedure: ESOPHAGOGASTRODUODENOSCOPY (EGD) WITH  PROPOFOL;  Surgeon: Daneil Dolin, MD;  Normal esophagus, small hiatal hernia, normal examined stomach, normal examined duodenum.  Gastric biopsy with slight chronic inflammation, duodenal biopsy with slight intramucosal Brunner gland hyperplasia.   ESOPHAGOGASTRODUODENOSCOPY (EGD) WITH PROPOFOL N/A 11/30/2020   Procedure: ESOPHAGOGASTRODUODENOSCOPY (EGD) WITH PROPOFOL;  Surgeon: Daryel November, MD;  Location: [place];  Service: Gastroenterology;  Laterality: N/A;   GIVENS CAPSULE STUDY N/A 11/23/2019    Surgeon: Daneil Dolin, MD; essentially unremarkable except for tiny erosion in the proximal small bowel.   GIVENS CAPSULE STUDY  11/30/2020   Procedure: GIVENS CAPSULE STUDY;  Surgeon: Daryel November, MD;  Location: [facility] ENDOSCOPY;  Service: Gastroenterology;;   LEFT HEART CATH AND CORONARY ANGIOGRAPHY N/A 12/18/2017   Procedure: LEFT HEART CATH AND CORONARY ANGIOGRAPHY;  Surgeon: Larey Dresser, MD;  Location: [place] [facility];  Service: Cardiovascular;  Laterality: N/A;   PVC ABLATION N/A 04/02/2017   Procedure: PVC ABLATION;  Surgeon: Constance Haw, MD;  Location: [place] [facility];  Service: Cardiovascular;  Laterality: N/A;   TEE WITHOUT CARDIOVERSION N/A 10/08/2016   Procedure: TRANSESOPHAGEAL ECHOCARDIOGRAM (TEE);  Surgeon: Larey Dresser, MD;  Location[facility];  Service: Cardiovascular;  Laterality: N/A;   THYROIDECTOMY      Current Medications: Current Meds  Medication Sig   acetaminophen (TYLENOL) 500 MG tablet Take 1,000 mg by mouth every 6 (six) hours as needed for mild pain.   buPROPion (WELLBUTRIN XL) 300 MG 24 hr tablet Take 1 tablet by mouth daily.   Calcium Carb-Cholecalciferol (CALCIUM + D3 PO) Take 1 tablet by mouth every morning.   Co-Enzyme Q10 100 MG CAPS Take 100 mg by mouth daily.   dexlansoprazole (DEXILANT) 60 MG capsule TAKE 1 CAPSULE(60 MG) BY MOUTH DAILY   ELIQUIS 5 MG TABS tablet TAKE 1 TABLET(5 MG) BY MOUTH TWICE DAILY   ENTRESTO  24-26 MG TAKE 1 TABLET BY MOUTH TWICE DAILY   escitalopram (LEXAPRO) 10 MG tablet Take 1 tablet by mouth daily.   fexofenadine (ALLEGRA) 180 MG tablet Take 180 mg by mouth daily.   furosemide (LASIX) 20 MG tablet TAKE 2 TABLETS(40 MG) BY MOUTH DAILY   nitroGLYCERIN (NITROSTAT) 0.4 MG SL tablet PLACE 1 TABLET UNDER THE TONGUE EVERY 5 MINUTES AS NEEDED FOR CHEST PAIN   rosuvastatin (CRESTOR) 10 MG tablet TAKE 1 TABLET(10 MG) BY MOUTH DAILY   sotalol (BETAPACE) 160 MG tablet TAKE 1 TABLET BY MOUTH EVERY MORNING AND 1/2 TABLET EVERY EVENING   spironolactone (ALDACTONE) 25 MG tablet Take 1 tablet (25 mg total) by mouth daily.   thyroid (ARMOUR) 120 MG tablet Take 120 mg by mouth daily before breakfast.   traZODone (DESYREL) 50 MG tablet Take 0.5 tablets (25 mg total) by mouth at bedtime as needed for sleep.   triamcinolone (NASACORT) 55 MCG/ACT AERO nasal inhaler Place 2 sprays into the nose daily.      Allergies:   Paxil [paroxetine], Morphine and related, Percocet [oxycodone-acetaminophen], Cefdinir, and Zolpidem   Social History   Socioeconomic History   Marital status: Married  Spouse name: Not on file   Number of children: Not on file   Years of education: Not on file   Highest education level: Not on file  Occupational History   Not on file  Tobacco Use   Smoking status: Never   Smokeless tobacco: Never  Vaping Use   Vaping Use: Never used  Substance and Sexual Activity   Alcohol use: No   Drug use: No   Sexual activity: Not on file  Other Topics Concern   Not on file  Social History Narrative   Not on file   Social Determinants of Health   Financial Resource Strain: Low Risk  (02/10/2020)   Overall Financial Resource Strain (CARDIA)    Difficulty of Paying Living Expenses: Not hard at all  Food Insecurity: No Food Insecurity (02/10/2020)   Hunger Vital Sign    Worried About Running Out of Food in the Last Year: Never true    Ran Out of Food in the Last Year: Never  true  Transportation Needs: No Transportation Needs (02/10/2020)   PRAPARE - Hydrologist (Medical): No    Lack of Transportation (Non-Medical): No  Physical Activity: Insufficiently Active (02/10/2020)   Exercise Vital Sign    Days of Exercise per Week: 3 days    Minutes of Exercise per Session: 30 min  Stress: No Stress Concern Present (02/10/2020)   [place]    Feeling of Stress : Not at all  Social Connections: [place] (02/10/2020)   Social Connection and Isolation Panel [NHANES]    Frequency of Communication with Friends and Family: More than three times a week    Frequency of Social Gatherings with Friends and Family: More than three times a week    Attends Religious Services: More than 4 times per year    Active Member of [employer] or Organizations: Yes    Attends Music therapist: More than 4 times per year    Marital Status: Married     Family History: The patient's family history includes Arrhythmia in her mother; Heart attack in her father; Lung cancer in her mother. There is no history of Colon cancer or Colon polyps.  ROS:   Please see the history of present illness.    All other systems reviewed and are negative.  EKGs/Labs/Other Studies Reviewed:    The following studies were reviewed today:  March 21, 2022 in clinic device interrogation personally reviewed ICD therapies OFF upon arrival. I turned ON HV therapies with shocks only today. No ATP. RV capture threshold high.    Recent Labs: 05/29/2021: NT-Pro BNP 2,121 10/04/2021: Hemoglobin 12.7; Platelets 201 01/03/2022: BUN 17; Creatinine, Ser 1.17; Potassium 4.0; Sodium 139   Recent Lipid Panel    Component Value Date/Time   CHOL 135 10/04/2021 1112   TRIG 108 10/04/2021 1112   HDL 51 10/04/2021 1112   CHOLHDL 2.6 10/04/2021 1112   VLDL 22 10/04/2021 1112   LDLCALC 62 10/04/2021 1112     Physical Exam:    VS:  BP 94/68   Pulse (!) 59   Ht 5' 9"$  (1.753 m)   Wt 161 lb (73 kg)   SpO2 98%   BMI 23.78 kg/m     Wt Readings from Last 3 Encounters:  03/21/22 161 lb (73 kg)  02/26/22 161 lb 12.8 oz (73.4 kg)  01/03/22 159 lb 9.6 oz (72.4 kg)     GEN: Well nourished,  well developed in no acute distress CARDIAC: RRR, no murmurs, rubs, gallops.  ICD pocket well-healed. RESPIRATORY:  Clear to auscultation without rales, wheezing or rhonchi        ASSESSMENT:    1. Chronic systolic (congestive) heart failure ([place])   2. Implantable cardioverter-defibrillator (ICD) in situ   3. Malfunction of implantable defibrillator ventricular (ICD) lead   4. Paroxysmal atrial fibrillation (HCC)   5. PVC's (premature ventricular contractions)   6. Encounter for long-term (current) use of high-risk medication    PLAN:    In order of problems listed above:   #Chronic systolic heart failure #ICD #ICD lead malfunction The patient has chronic systolic heart failure secondary to her ischemic cardiomyopathy.  She also has frequent PVCs.  Her ICD lead is no longer functional with an elevated pacing impedance and capture threshold.  She follows with Dr. Aundra Dubin.  Given persistently reduced ejection fraction while on GDMT, she is referred to discuss CCM.  I discussed the lead extraction procedure in detail including the risks and recovery.  We discussed the need for cardiothoracic surgical backup.  The patient has an ischemic CM (EF 25%), NYHA Class III CHF, and CAD.  He is referred by Dr Aundra Dubin and [facility] for risk stratification of sudden death and consideration of ICD explant and reimplant due to a RV lead malfunction.  I have had a thorough discussion with the patient reviewing options.  The patient and their family (if available) have had opportunities to ask questions and have them answered. The patient and I have decided together through a shared decision making process to proceed  with ICD extraction and implant at this time.    Risks, benefits, alternatives to ICD extraction and reimplant were discussed in detail with the patient today. The patient understands that the risks include but are not limited to bleeding, infection, pneumothorax, perforation, tamponade, vascular damage, renal failure, MI, stroke, death, inappropriate shocks, and lead dislodgement.  She also understands her risk of severe bleeding requiring emergent open heart surgery and wishes to proceed.    We need to revise her ICD system first and let this heal.  We will plan to meet back in clinic after she is healed from the lead extraction and reimplant to discuss CCM therapy in more detail.  The CCM implant should be done under light sedation to confirm no stimulation during CCM therapy.  Plan for CT extraction protocol prior to procedure.  Medication Adjustments/Labs and Tests Ordered: Current medicines are reviewed at length with the patient today.  Concerns regarding medicines are outlined above.   No orders of the defined types were placed in this encounter.  No orders of the defined types were placed in this encounter.  I,Mathew Stumpf,acting as a Education administrator for Vickie Epley, MD.,have documented all relevant documentation on the behalf of Vickie Epley, MD,as directed by  Vickie Epley, MD while in the presence of Vickie Epley, MD.  I, Vickie Epley, MD, have reviewed all documentation for this visit. The documentation on 03/21/22 for the exam, diagnosis, procedures, and orders are all accurate and complete.   Signed, Lars Mage, MD, [facility], [facility] 03/21/2022 3:05 PM    Electrophysiology [facility]

## 2022-03-22 LAB — CBC WITH DIFFERENTIAL/PLATELET
Basophils Absolute: 0 10*3/uL (ref 0.0–0.2)
Basos: 0 %
EOS (ABSOLUTE): 0.1 10*3/uL (ref 0.0–0.4)
Eos: 2 %
Hematocrit: 36 % (ref 34.0–46.6)
Hemoglobin: 12.4 g/dL (ref 11.1–15.9)
Immature Grans (Abs): 0 10*3/uL (ref 0.0–0.1)
Immature Granulocytes: 1 %
Lymphocytes Absolute: 1.6 10*3/uL (ref 0.7–3.1)
Lymphs: 31 %
MCH: 31.3 pg (ref 26.6–33.0)
MCHC: 34.4 g/dL (ref 31.5–35.7)
MCV: 91 fL (ref 79–97)
Monocytes Absolute: 0.5 10*3/uL (ref 0.1–0.9)
Monocytes: 10 %
Neutrophils Absolute: 2.9 10*3/uL (ref 1.4–7.0)
Neutrophils: 56 %
Platelets: 175 10*3/uL (ref 150–450)
RBC: 3.96 x10E6/uL (ref 3.77–5.28)
RDW: 12.7 % (ref 11.7–15.4)
WBC: 5.2 10*3/uL (ref 3.4–10.8)

## 2022-03-22 LAB — BASIC METABOLIC PANEL
BUN/Creatinine Ratio: 23 (ref 9–23)
BUN: 24 mg/dL (ref 6–24)
CO2: 23 mmol/L (ref 20–29)
Calcium: 8.7 mg/dL (ref 8.7–10.2)
Chloride: 102 mmol/L (ref 96–106)
Creatinine, Ser: 1.06 mg/dL — ABNORMAL HIGH (ref 0.57–1.00)
Glucose: 86 mg/dL (ref 70–99)
Potassium: 4.7 mmol/L (ref 3.5–5.2)
Sodium: 139 mmol/L (ref 134–144)
eGFR: 62 mL/min/{1.73_m2} (ref >59)

## 2022-03-26 ENCOUNTER — Ambulatory Visit: Payer: BC Managed Care – PPO | Attending: Cardiovascular Disease | Admitting: Cardiovascular Disease

## 2022-03-26 ENCOUNTER — Encounter: Payer: Self-pay | Admitting: Cardiovascular Disease

## 2022-03-26 VITALS — BP 100/70 | HR 57 | Ht 69.0 in | Wt 160.8 lb

## 2022-03-26 DIAGNOSIS — I493 Ventricular premature depolarization: Secondary | ICD-10-CM

## 2022-03-26 DIAGNOSIS — I480 Paroxysmal atrial fibrillation: Secondary | ICD-10-CM | POA: Diagnosis not present

## 2022-03-26 DIAGNOSIS — I340 Nonrheumatic mitral (valve) insufficiency: Secondary | ICD-10-CM

## 2022-03-26 DIAGNOSIS — I2510 Atherosclerotic heart disease of native coronary artery without angina pectoris: Secondary | ICD-10-CM | POA: Diagnosis not present

## 2022-03-26 DIAGNOSIS — I255 Ischemic cardiomyopathy: Secondary | ICD-10-CM

## 2022-03-26 DIAGNOSIS — I5022 Chronic systolic (congestive) heart failure: Secondary | ICD-10-CM

## 2022-03-26 NOTE — Progress Notes (Signed)
Chief Complaint  Patient presents with   Follow-up    CAD    History of Present Illness: 57 yo female with history of CAD with anterior STEMI in July 2017, ischemic cardiomyopathy s/p ICD, chronic systolic CHF, paroxysmal atrial fib, severe mitral regurgitation here today for follow up. She presented to the [facility] ED 09/19/15 with chest pain and workup was delayed leading to late recognition of her MI. Cardiac cath 08/2715 with occluded mid LAD treated with a single drug eluting stent. She was found to have severe LV systolic dysfunction with LVEF around 30% immediately post cath. Continued chest pain and relook cath 09/21/15 demonstrated patency of the LAD stent. Her hospitalization was complicated by atrial fibrillation. She has been on Xarelto. She did not tolerate amiodarone. Echo November 2017 with LVEF 30-35%, severe MR. ICD implanted November 2017. She has been followed in the advanced CHF clinic by Dr. Aundra Dubin.TEE August 2018 showed LVEF=30% with moderate MR. She was found to have frequent PVCs on her device and Holter December 2018 showed 41% PVCs. She was started on Sotalol and Coreg was stopped. She had a PVC ablation in February 2019 but the PVCs returned. Echo August 2019 with LVEF=30% with normal RV size and function, moderate MR. She was admitted to [facility] in October 2019 with chest pain. Cardiac cath with patent LAD stent and no obstructive disease in the other vessels. She has had ongoing issues with iron deficiency anemia and GI workup including double-balloon enteroscopy at [facility] December 2022 with no clear source of bleeding. She has been seen by Dr. Quentin Ore in the EP clinic to discuss Watchman implantation. She is tolerating Eliquis. She is followed in the EP clinic by Dr. Lennie Odor and in the [facility] by Dr. Aundra Dubin. Echo March 2023 with with LVEF=25-30%, mild MR.   She is here today for follow up. The patient denies any chest pain, dyspnea, palpitations, lower  extremity edema, orthopnea, PND, dizziness, near syncope or syncope.   Primary Care Physician: Celene Squibb, MD  Past Medical History:  Diagnosis Date   AICD (automatic cardioverter/defibrillator) present    [employer]   CAD in native artery    a. late recognition of presentation of STEMI 08/2015 s/p DES to LAD, mild residual mRCA.   Chronic systolic CHF (congestive heart failure) (HCC)    CKD (chronic kidney disease), stage III (HCC)    Hypotension    a. preventing med titration for HF.   Hypothyroidism 09/24/2015   Ischemic cardiomyopathy    Myocardial infarction [facility]) 08/2015   PAF (paroxysmal atrial fibrillation) ([place]) 09/24/2015   a. dx at time of STEMI 08/2015.   Pre-diabetes    Presence of permanent cardiac pacemaker    patient has ICD   PVC's (premature ventricular contractions)     Past Surgical History:  Procedure Laterality Date   BIOPSY  06/16/2019   Procedure: BIOPSY;  Surgeon: Daneil Dolin, MD;  [facility];  Service: Endoscopy;;   CARDIAC CATHETERIZATION N/A 09/20/2015   Procedure: Left Heart Cath and Coronary Angiography;  Surgeon: Burnell Blanks, MD;  Location: [place] CV LAB;  Service: Cardiovascular;  Laterality: N/A;   CARDIAC CATHETERIZATION N/A 09/20/2015   Procedure: Coronary Stent Intervention;  Surgeon: Burnell Blanks, MD;  Location: [place] CV LAB;  Service: Cardiovascular;  Laterality: N/A;   CARDIAC CATHETERIZATION N/A 09/21/2015   Procedure: Left Heart Cath and Coronary Angiography;  Surgeon: Sherren Mocha, MD;  Location: [place]  CV LAB;  Service: Cardiovascular;  Laterality: N/A;   CARDIAC CATHETERIZATION N/A 11/26/2015   Procedure: Right Heart Cath;  Surgeon: Larey Dresser, MD;  Location: [place] CV LAB;  Service: Cardiovascular;  Laterality: N/A;   COLONOSCOPY WITH PROPOFOL N/A 11/23/2017   Dr. Gala Romney: Diverticulosis, internal hemorrhoids, next colonoscopy in 10 years   COLONOSCOPY WITH PROPOFOL N/A  08/10/2019   Procedure: COLONOSCOPY WITH PROPOFOL;  Surgeon: Daneil Dolin, MD;  Diverticulosis in sigmoid colon, nonbleeding internal hemorrhoids, otherwise normal exam.     COLONOSCOPY WITH PROPOFOL N/A 12/02/2020   Procedure: COLONOSCOPY WITH PROPOFOL;  Surgeon: Carol Ada, MD;  Location: [place];  Service: Endoscopy;  Laterality: N/A;   CORONARY STENT PLACEMENT  09/20/2015   EP IMPLANTABLE DEVICE N/A 01/23/2016   Procedure: ICD Implant;  Surgeon: Will Meredith Leeds, MD;  Location: [place] CV LAB;  Service: Cardiovascular;  Laterality: N/A;   ESOPHAGOGASTRODUODENOSCOPY (EGD) WITH PROPOFOL N/A 06/16/2019   Procedure: ESOPHAGOGASTRODUODENOSCOPY (EGD) WITH PROPOFOL;  Surgeon: Daneil Dolin, MD;  Normal esophagus, small hiatal hernia, normal examined stomach, normal examined duodenum.  Gastric biopsy with slight chronic inflammation, duodenal biopsy with slight intramucosal Brunner gland hyperplasia.   ESOPHAGOGASTRODUODENOSCOPY (EGD) WITH PROPOFOL N/A 11/30/2020   Procedure: ESOPHAGOGASTRODUODENOSCOPY (EGD) WITH PROPOFOL;  Surgeon: Daryel November, MD;  Location: [place];  Service: Gastroenterology;  Laterality: N/A;   GIVENS CAPSULE STUDY N/A 11/23/2019    Surgeon: Daneil Dolin, MD; essentially unremarkable except for tiny erosion in the proximal small bowel.   GIVENS CAPSULE STUDY  11/30/2020   Procedure: GIVENS CAPSULE STUDY;  Surgeon: Daryel November, MD;  Location: [facility] ENDOSCOPY;  Service: Gastroenterology;;   LEFT HEART CATH AND CORONARY ANGIOGRAPHY N/A 12/18/2017   Procedure: LEFT HEART CATH AND CORONARY ANGIOGRAPHY;  Surgeon: Larey Dresser, MD;  Location: [place] CV LAB;  Service: Cardiovascular;  Laterality: N/A;   PVC ABLATION N/A 04/02/2017   Procedure: PVC ABLATION;  Surgeon: Constance Haw, MD;  Location: [place] CV LAB;  Service: Cardiovascular;  Laterality: N/A;   TEE WITHOUT CARDIOVERSION N/A 10/08/2016   Procedure: TRANSESOPHAGEAL  ECHOCARDIOGRAM (TEE);  Surgeon: Larey Dresser, MD;  Location: [facility] ENDOSCOPY;  Service: Cardiovascular;  Laterality: N/A;   THYROIDECTOMY      Home Meds:  Current Meds  Medication Sig   acetaminophen (TYLENOL) 500 MG tablet Take 1,000 mg by mouth every 6 (six) hours as needed for mild pain.   buPROPion (WELLBUTRIN XL) 300 MG 24 hr tablet Take 1 tablet by mouth daily.   Calcium Carb-Cholecalciferol (CALCIUM + D3 PO) Take 1 tablet by mouth every morning.   Co-Enzyme Q10 100 MG CAPS Take 100 mg by mouth daily.   dexlansoprazole (DEXILANT) 60 MG capsule TAKE 1 CAPSULE(60 MG) BY MOUTH DAILY   ELIQUIS 5 MG TABS tablet TAKE 1 TABLET(5 MG) BY MOUTH TWICE DAILY   ENTRESTO 24-26 MG TAKE 1 TABLET BY MOUTH TWICE DAILY   escitalopram (LEXAPRO) 10 MG tablet Take 1 tablet by mouth daily.   fexofenadine (ALLEGRA) 180 MG tablet Take 180 mg by mouth daily.   furosemide (LASIX) 20 MG tablet TAKE 2 TABLETS(40 MG) BY MOUTH DAILY   nitroGLYCERIN (NITROSTAT) 0.4 MG SL tablet PLACE 1 TABLET UNDER THE TONGUE EVERY 5 MINUTES AS NEEDED FOR CHEST PAIN   rosuvastatin (CRESTOR) 10 MG tablet TAKE 1 TABLET(10 MG) BY MOUTH DAILY   sotalol (BETAPACE) 160 MG tablet TAKE 1 TABLET BY MOUTH EVERY MORNING AND 1/2 TABLET EVERY EVENING  spironolactone (ALDACTONE) 25 MG tablet Take 1 tablet (25 mg total) by mouth daily.   thyroid (ARMOUR) 120 MG tablet Take 120 mg by mouth daily before breakfast.   traZODone (DESYREL) 50 MG tablet Take 0.5 tablets (25 mg total) by mouth at bedtime as needed for sleep.   triamcinolone (NASACORT) 55 MCG/ACT AERO nasal inhaler Place 2 sprays into the nose daily.     Allergies  Allergen Reactions   Paxil [Paroxetine] Palpitations   Morphine And Related Nausea And Vomiting   Percocet [Oxycodone-Acetaminophen] Nausea And Vomiting   Cefdinir Nausea Only   Zolpidem Other (See Comments)    Doesn't work for patient at all    Social History   Socioeconomic History   Marital status: Married     Spouse name: Not on file   Number of children: Not on file   Years of education: Not on file   Highest education level: Not on file  Occupational History   Not on file  Tobacco Use   Smoking status: Never   Smokeless tobacco: Never  Vaping Use   Vaping Use: Never used  Substance and Sexual Activity   Alcohol use: No   Drug use: No   Sexual activity: Not on file  Other Topics Concern   Not on file  Social History Narrative   Not on file   Social Determinants of Health   Financial Resource Strain: Low Risk  (02/10/2020)   Overall Financial Resource Strain (CARDIA)    Difficulty of Paying Living Expenses: Not hard at all  Food Insecurity: No Food Insecurity (02/10/2020)   Hunger Vital Sign    Worried About Running Out of Food in the Last Year: Never true    [place] in the Last Year: Never true  Transportation Needs: No Transportation Needs (02/10/2020)   PRAPARE - Hydrologist (Medical): No    Lack of Transportation (Non-Medical): No  Physical Activity: Insufficiently Active (02/10/2020)   Exercise Vital Sign    Days of Exercise per Week: 3 days    Minutes of Exercise per Session: 30 min  Stress: No Stress Concern Present (02/10/2020)   [place]    Feeling of Stress : Not at all  Social Connections: [place] (02/10/2020)   Social Connection and Isolation Panel [NHANES]    Frequency of Communication with Friends and Family: More than three times a week    Frequency of Social Gatherings with Friends and Family: More than three times a week    Attends Religious Services: More than 4 times per year    Active Member of [employer] or Organizations: Yes    Attends Music therapist: More than 4 times per year    Marital Status: Married  Human resources officer Violence: Not At Risk (02/10/2020)   Humiliation, Afraid, Rape, and Kick questionnaire    Fear of Current  or Ex-Partner: No    Emotionally Abused: No    Physically Abused: No    Sexually Abused: No    Family History  Problem Relation Age of Onset   Heart attack Father    Arrhythmia Mother    Lung cancer Mother        bronchiectasis   Colon cancer Neg Hx    Colon polyps Neg Hx     Review of Systems:  As stated in the HPI and otherwise negative.   BP 100/70   Pulse (!) 57  Ht 5\' 9"  (1.753 m)   Wt 72.9 kg   SpO2 98%   BMI 23.75 kg/m   Physical Examination:  General: Well developed, well nourished, NAD  HEENT: OP clear, mucus membranes moist  SKIN: warm, dry. No rashes. Neuro: No focal deficits  Musculoskeletal: Muscle strength 5/5 all ext  Psychiatric: Mood and affect normal  Neck: No JVD, no carotid bruits, no thyromegaly, no lymphadenopathy.  Lungs:Clear bilaterally, no wheezes, rhonci, crackles Cardiovascular: Regular rate and rhythm. No murmurs, gallops or rubs. Abdomen:Soft. Bowel sounds present. Non-tender.  Extremities: No lower extremity edema. Pulses are 2 + in the bilateral DP/PT.  Echo March 2023: 1. Left ventricular ejection fraction, by estimation, is 25 to 30%. The  left ventricle has severely decreased function. The left ventricle  demonstrates regional wall motion abnormalities with akinesis of the  inferoseptal and anteroseptal walls, akinesis   of the apical lateral wall, the apical inferior wall, and the apical  anterior wall. Akinesis of the apex. No LV thrombus. The left ventricular  internal cavity size was mildly dilated. Left ventricular diastolic  parameters are consistent with Grade I  diastolic dysfunction (impaired relaxation).   2. Right ventricular systolic function is normal. The right ventricular  size is normal. Tricuspid regurgitation signal is inadequate for assessing  PA pressure.   3. The mitral valve is normal in structure. Mild mitral valve  regurgitation. No evidence of mitral stenosis.   4. The aortic valve is tricuspid. Aortic  valve regurgitation is not  visualized. No aortic stenosis is present.   5. The inferior vena cava is normal in size with greater than 50%  respiratory variability, suggesting right atrial pressure of 3 mmHg.   EKG:  EKG is not ordered today. The ekg ordered today demonstrates   Recent Labs: 05/29/2021: NT-Pro BNP 2,121 03/21/2022: BUN 24; Creatinine, Ser 1.06; Hemoglobin 12.4; Platelets 175; Potassium 4.7; Sodium 139   Lipid Panel    Component Value Date/Time   CHOL 135 10/04/2021 1112   TRIG 108 10/04/2021 1112   HDL 51 10/04/2021 1112   CHOLHDL 2.6 10/04/2021 1112   VLDL 22 10/04/2021 1112   LDLCALC 62 10/04/2021 1112     Wt Readings from Last 3 Encounters:  03/26/22 72.9 kg  03/21/22 73 kg  02/26/22 73.4 kg     Assessment and Plan:    1. CAD without angina: She is known to have CAD with anterior MI in July 2017 with placement of a single DES in the mid LAD. She had prolonged ischemic time and now has LV dysfunction. Cardiac cath October 2019 with patent LAD stent and no disease in the other vessels. No chest pain suggestive of angina. No ASA since she is on Eliquis.Continue statin.  She is on sotalol for PVCs and Xarelto for PAF. LDL 62 in August 2023  2. PAF/PVCs: She is in sinus today. She is followed in the EP clinic. Of note, she did not tolerate amiodarone.  Continue Sotalol and Eliquis.   3. Chronic systolic CHF/Ischemic cardiomyopathy:  LVEF=30% by echo March 2023. ICD in place. She is followed in the advanced heart failure clinic by Dr. April 2023. Apical akinesis but she is on Elqiuis for PAF. Weight is stable. No volume overload on exam. She saw Dr. Shirlee Latch last week to discuss cardiac contractility modulation. Planning underway for lead extraction and reimplant of her ICD.   4. Mitral regurgitation: Mild by echo March 2023.   Follow up with me in one year  Signed, April 2023  Clifton James, MD 03/26/2022 4:45 PM    [facility] [address] [place],  [place], [place]  [address] Phone: [phone]; Fax: [phone]

## 2022-03-26 NOTE — Patient Instructions (Signed)
Medication Instructions:  No changes *If you need a refill on your cardiac medications before your next appointment, please call your pharmacy*   Lab Work: none   Testing/Procedures: none   Follow-Up: At [facility], you and your health needs are our priority.  As part of our continuing mission to provide you with exceptional heart care, we have created designated Provider Care Teams.  These Care Teams include your primary Cardiologist (physician) and Advanced Practice Providers (APPs -  Physician Assistants and Nurse Practitioners) who all work together to provide you with the care you need, when you need it.   Your next appointment:   12 month(s)  Provider:   Christopher McAlhany, MD      

## 2022-04-01 IMAGING — CT CT CTA ABD/PEL W/CM AND/OR W/O CM
2 of 16 series · 10 of 46 positions shown, 17 images · IV contrast (APPLIED)
Comparison: None.

CLINICAL DATA: Abdominal pain, GI bleed

EXAM:
CTA ABDOMEN AND PELVIS WITHOUT AND WITH CONTRAST
TECHNIQUE: Multidetector CT imaging of the abdomen and pelvis was performed
using the standard protocol during bolus administration of
intravenous contrast. Multiplanar reconstructed images and MIPs were
obtained and reviewed to evaluate the vascular anatomy.
CONTRAST:  75mL OMNIPAQUE IOHEXOL 350 MG/ML SOLN

[Series 14: venous thins · axial · portal-venous · 0.71mm/px · z∈[+1079,+1439]mm · 9 of 1102 slices shown, 15 images]
[im 101/1102  soft-tissue]
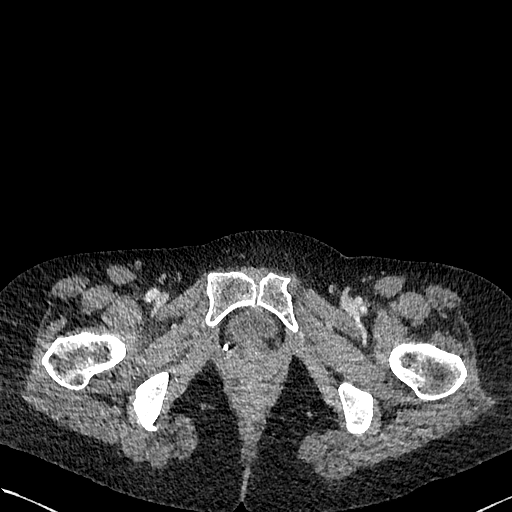
[im 101/1102  bone]
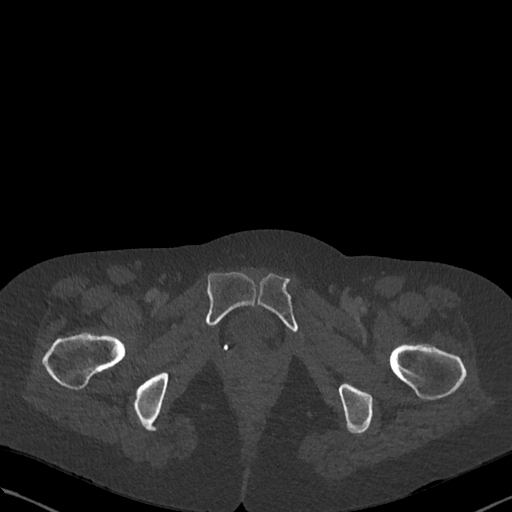
[im 201/1102  soft-tissue]
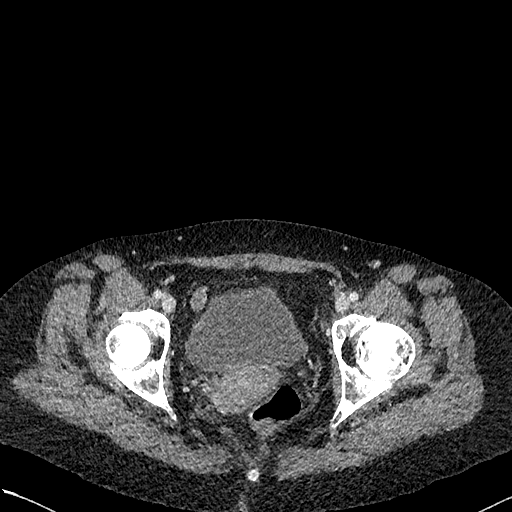
[im 301/1102  soft-tissue]
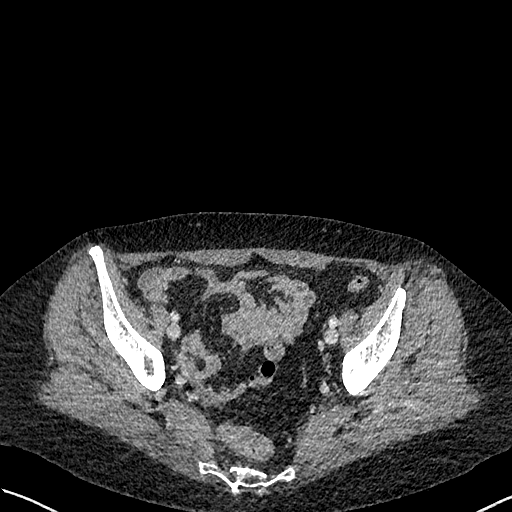
[im 401/1102  soft-tissue]
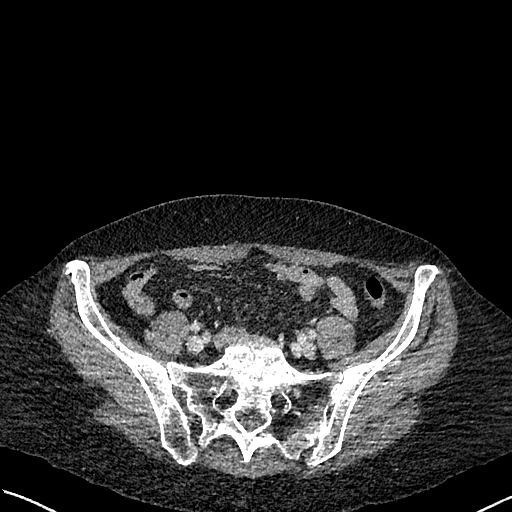
[im 601/1102  soft-tissue]
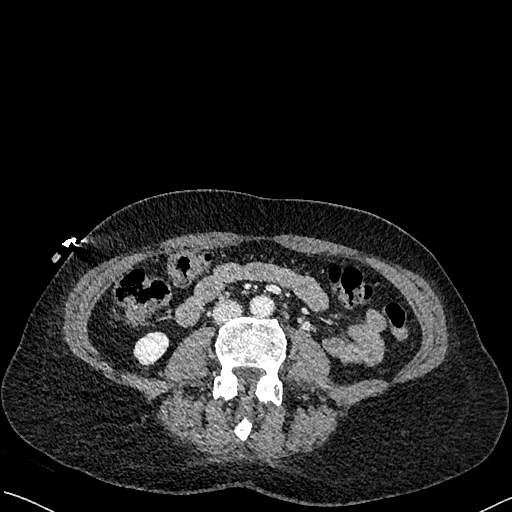
[im 701/1102  soft-tissue]
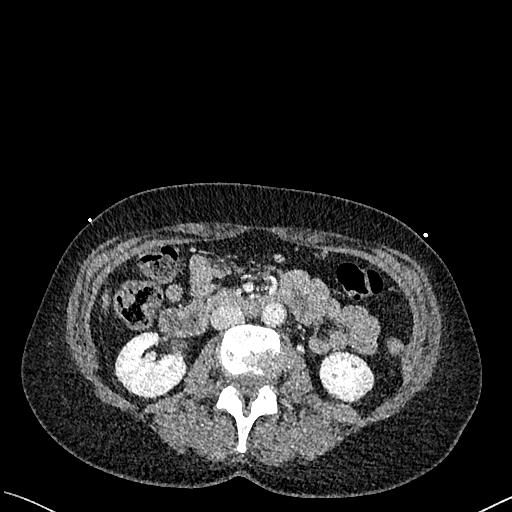
[im 701/1102  lung]
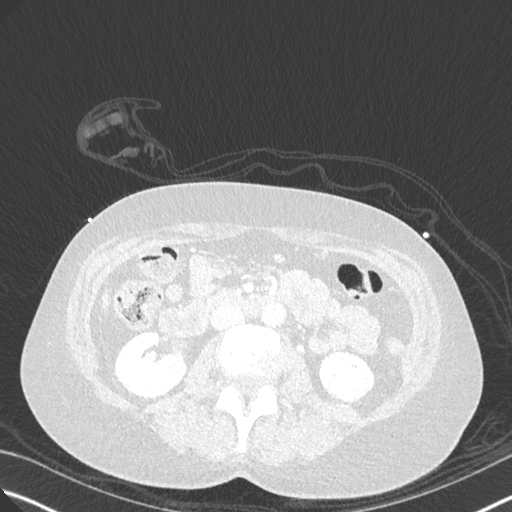
[im 801/1102  soft-tissue]
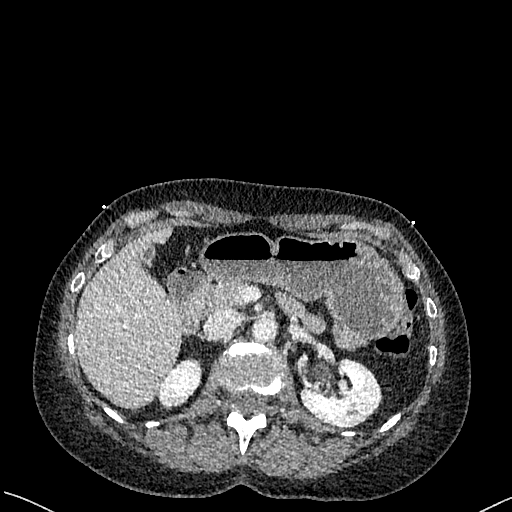
[im 801/1102  lung]
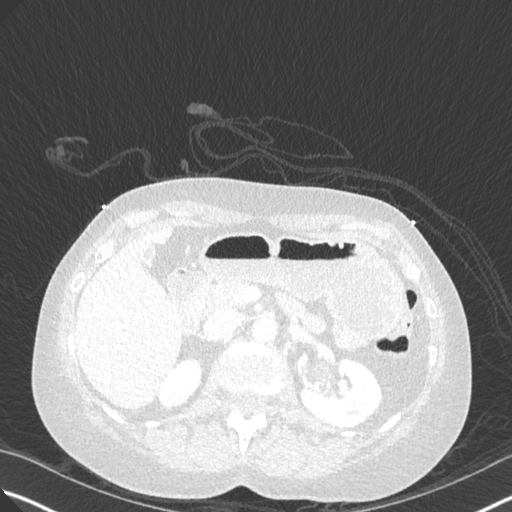
[im 901/1102  soft-tissue]
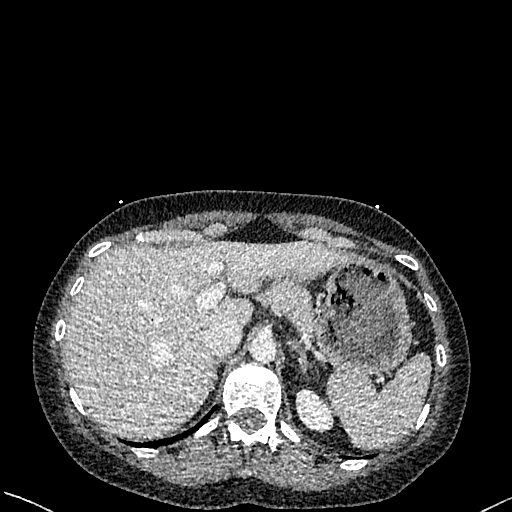
[im 901/1102  lung]
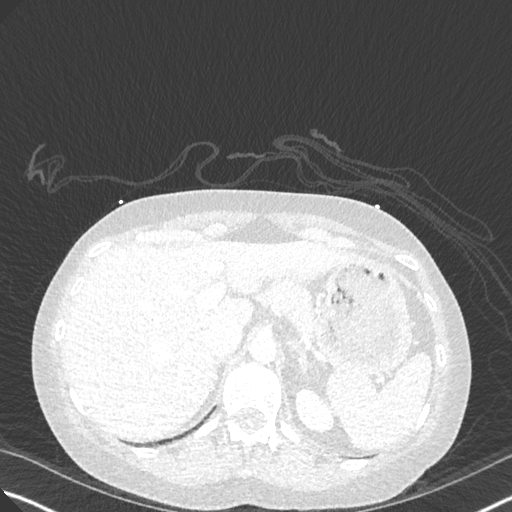
[im 1001/1102  soft-tissue]
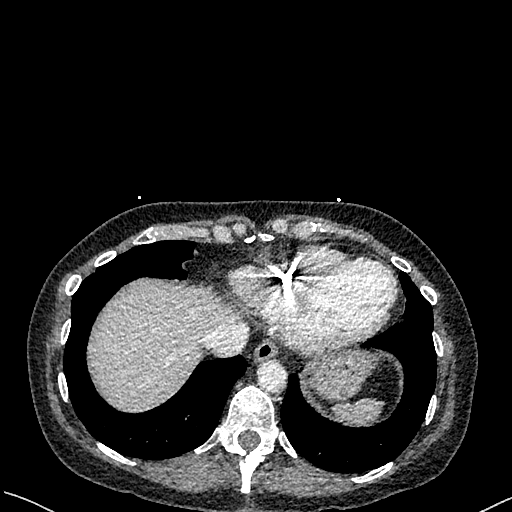
[im 1001/1102  lung]
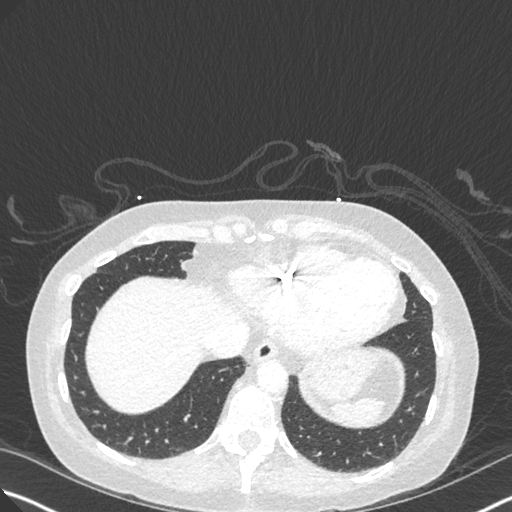
[im 1001/1102  bone]
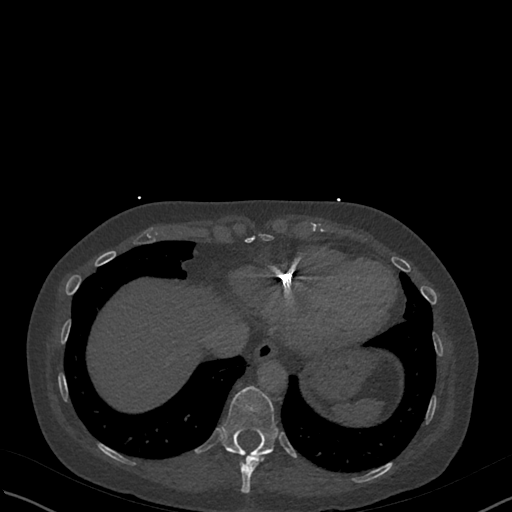

[Series 16: cor · coronal · 0.65mm/px · 1 of 129 slices shown, 2 images]
[im 65/129  soft-tissue]
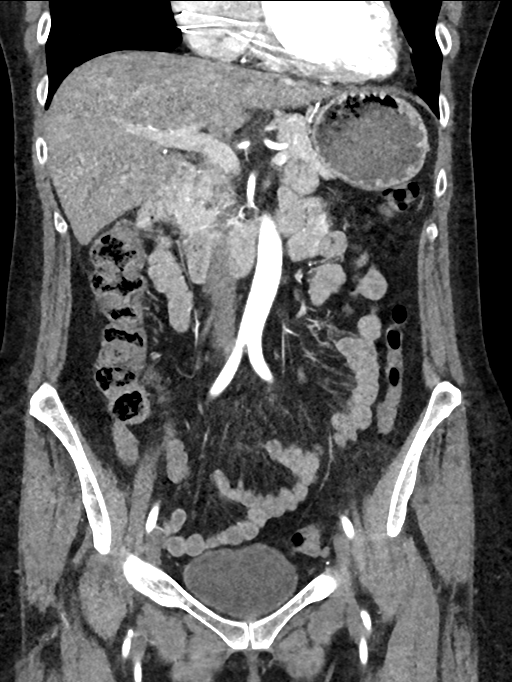
[im 65/129  bone]
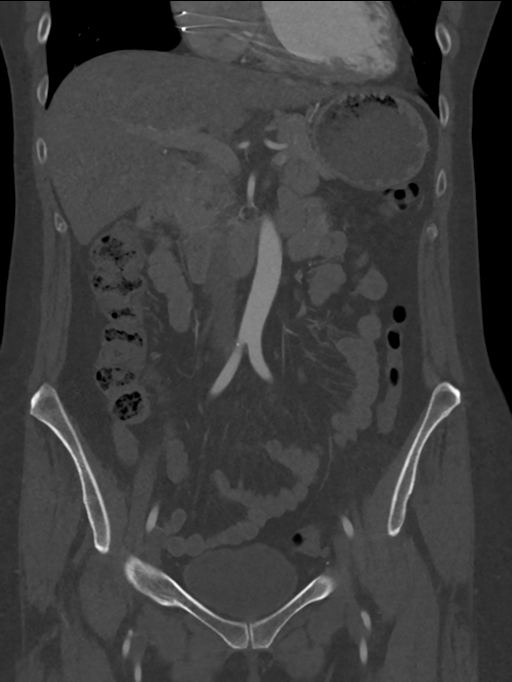

[10 of 46 positions shown; findings below may reference images not displayed]

FINDINGS: VASCULAR

Aorta: Normal caliber aorta without aneurysm, dissection, vasculitis
or significant stenosis.

Celiac: Patent without evidence of aneurysm, dissection, vasculitis
or significant stenosis. The common hepatic artery arises directly
from the aorta, an anatomic variant.

SMA: Patent without evidence of aneurysm, dissection, vasculitis or
significant stenosis.

Renals: Both renal arteries are patent without evidence of aneurysm,
dissection, vasculitis, fibromuscular dysplasia or significant
stenosis.

IMA: Patent without evidence of aneurysm, dissection, vasculitis or
significant stenosis.

Inflow: Patent without evidence of aneurysm, dissection, vasculitis
or significant stenosis.

Proximal Outflow: Bilateral common femoral and visualized portions
of the superficial and profunda femoral arteries are patent without
evidence of aneurysm, dissection, vasculitis or significant
stenosis.

Veins: Iliac venous system and IVC patent. Renal veins patent. There
is mild narrowing of the left renal vein centrally. Patent hepatic
veins portal vein, [REDACTED] V, splenic vein.

Review of the MIP images confirms the above findings.

NON-VASCULAR

Lower chest: Transvenous pacing lead extends towards the RV apex. No
pleural or pericardial effusion.

Hepatobiliary: No focal liver abnormality is seen. No gallstones,
gallbladder wall thickening, or biliary dilatation.

Pancreas: Unremarkable. No pancreatic ductal dilatation or
surrounding inflammatory changes.

Spleen: Normal in size without focal abnormality.

Adrenals/Urinary Tract: Adrenal glands unremarkable. 1 cm probable
cyst in the mid left kidney. No urolithiasis or hydronephrosis.
Urinary bladder physiologically distended.

Stomach/Bowel: No evidence of acute/active hemorrhage into the
bowel. The stomach is physiologically distended, unremarkable. The
small bowel is decompressed. Nondilated appendix containing small
appendicoliths without adjacent inflammatory change. The colon is
nondilated, unremarkable.

Lymphatic: No abdominal or pelvic adenopathy.

Reproductive: Uterus and bilateral adnexa are unremarkable.

Other: Bilateral pelvic phleboliths.  No ascites.  No free air.

Musculoskeletal: No acute or significant osseous findings.
IMPRESSION: No evidence of active GI bleed or other acute finding.

## 2022-04-01 NOTE — Progress Notes (Signed)
ADVANCED HF CLINIC NOTE  PCP: Dr. Nevada Crane HF Cardiology: Dr. Aundra Dubin  Catherine Mays is a 57 y.o. with history of CAD s/p anterior STEMI with DES to LAD, ischemic cardiomyopathy, mitral regurgitation, and paroxysmal atrial fibrillation.  She was admitted in 7/17 with anterior STEMI.  She had a late presentation to the cath lab.  Echo in 10/17 showed EF about 25% with LAD-territory wall motion abnormalities.  She had peri-MI atrial fibrillation and was started on Xarelto.  She was put on amiodarone.   After discharge, she continued to feel poorly.  She developed nausea, anorexia, and significant fatigue.  She was admitted for evaluation in 10/17 given concern for low output heart failure.  However, stopping amiodarone essentially resolved her symptoms.  She had RHC showing preserved cardiac output but the PCWP was high due to prominent v-waves, presumably from significant mitral regurgitation.  Of note, she was in atrial fibrillation for a time during this admission.   She had St Jude ICD placed.  She is back at work for the [employer].   TEE in 8/18 to evaluate the mitral valve showed EF 30%, moderate MR (probably functional).    Patient was seen by Dr. Curt Bears and noted to have very frequent PVCs on device interrogation.  Holter in 12/18 showed 41% PVCs.  She was started on sotalol 80 mg bid and titrated up to 120 mg bid.  Coreg was stopped with the addition of sotalol and losartan was decreased to 12.5 mg daily due to low BP.  She had PVC ablation in 2/19 but PVCs recurred. Sotalol was increased to 160 mg bid.   Echo 8/19 showed EF 30% with regional wall motion abnormalities, normal RV size and systolic function, moderate MR.   She was admitted in 10/19 with chest pain.  Cath showed patent LAD stent and no obstructive CAD.  She was found to be anemic with Fe deficiency (hgb down to 8).  FOBT+. She has had IV Fe and blood transfusion.  EGD and colonoscopy this year were fairly  unremarkable.  She had diverticulosis.  Capsule endoscopy negative in 9/21.   Echo in 1/22 showed EF 25-30%, wall motion abnormalities in LAD distribution, mild-moderate MR, normal RV.    Admitted 11/28/2020 with anemia, hemoglobin of 8.6 (baseline around 10-11). She was transfused 1 unit of PRBC on 11/29/2020. GI was consulted and she underwent EGD on 11/30/2020 which showed no source of bleeding.  Capsule endoscopy on 12/01/2020 showed blood in the mid/distal small bowel and proximal colon.  Colonoscopy on 12/02/2020 showed blood in the terminal ileum but no clear source.  It is suspected that the source of bleeding is in the distal small bowel that is not able to be reached via routine colonoscope.  GI asked IR to consider an arteriogram to localize and treat the source of her bleeding. CT done and felt best option was DBE with Dr. Malissa Hippo at [place]. Double balloon endoscopy was done in 12/22, no definite cause of GI bleeding was found.   Echo in 3/23 showed EF 25-30%, apical akinesis, mild MR, normal RV, normal IVC.   Follow up 11/23, She returns for followup of CHF.  BP remains low but she denies lightheadedness unless she stands too fast or bends over for a long time then stands up.  Weight is up, she attributes this to adjustments in her thyroid medications recently.  No significant exertional dyspnea.  No orthopnea/PND.  No chest pain. Fatigues easily.   Follow  up with Dr. Quentin Ore (1/24) to discuss CCM. Plan to extract ICD and and re-implant.   Today she returns for HF follow up. Overall feeling fine. She remains fatigued, dizzy when bending over. This is her baseline. She has SOB walking long distances on flat gorund, or up inclines. She has dyspnea walking up 4 steps. Denies palpitations, abnormal bleeding, CP, edema, or PND/Orthopnea. Appetite ok. No fever or chills. Weight at home 158 pounds. Taking all medications. BP at home 94/60s.   ECG (personally reviewed from 02/26/22): SB 50 bpm, QTc 408  msec  St Jude device interrogation: No VT, < 1 % v paced.  Labs (1/19): K 4.4, creatinine 1.14 Labs (2/19): LDL 97 (off atorvastatin), HDL 56 Labs (5/19): K 4.3, creatinine 1.1 Labs (10/19): K 4.2, creatinine 1.2, LDL 70, hgb 11.8 Labs (11/19): K 4.9, creatinine 1.17, hgb 11.8, LDL 68, low TSH  Labs (9/20): K 4.4, creatinine 1.34, LDL 52 Labs (12/20): K 4.6, creatinine 1.19 Labs (6/21): LDL 73, HDL 62, hgb 11.9, K 4.2, creatinine 1.14 Labs (7/21): hgb 11.2, K 3.8, creatinine 1.12 Labs (10/21): K 4.5, creatinine 1.38, LDL 53, HDL 50 Labs (1/22): K 4.9, creatinine 0.99 Labs (2/22): hgb 11.6, Fe studies normal Labs (5/22): K 5, creatinine 1.05, hgb 11.8 Labs (10/22): K 3.9, creatinine 1.02, hgb 7.1 Labs (11/22): hgb 11.9, K 3.7, creatinine 1.02, BNP 347 Labs (3/23): hgb 13.1 Labs (4/23): K 4.1, creatinine 1.16, pro-BNP 2121 Labs (5/23): LDL 71, TGs 74, K 4.1, creatinine 0.98 Labs (8/23): LDL 62, NT-proBNP 2843, K 3.8, creatinine 1.02 Labs (1/24): K 4.7, creatinine 1.06  PMH:  1. CAD: Anterior STEMI in 7/17 with late presentation to the cath lab. She had DES to proximal LAD.  No significant disease in other vessels.  - LHC (10/19): Patent LAD stent, no obstructive disease.  2. Chronic systolic CHF: Ischemic cardiomyopathy.  - Echo 10/17 with EF 25%, akinesis of the mid-apical anteroseptal and anterior walls, apical dyskinesis, moderate to severe MR with incomplete coaptation.  - RHC (10/17): mean RA 4, PA 59/17 mean 38, mean PCWP 27 with prominent V waves suggesting significant MR, CI 3.06, PVR 2 WU.  - ACEI cough.  - Echo (11/17): EF 30-35%, normal RV size and systolic function, severe MR with restriction of posterior leaflet.   - Echo (8/19): EF 30% with regional wall motion abnormalities, normal RV size and systolic function, moderate MR.  - Echo (12/20): EF 30%, LAD wall motion abnormalities, normal RV, PASP 35, moderate functional MR.  - Echo (1/22): EF 25-30%, wall motion  abnormalities in LAD distribution, mild-moderate MR, normal RV.  - Echo (3/23): EF 25-30%, apical akinesis, mild MR, normal RV, normal IVC.  3. Atrial fibrillation: Paroxysmal.  Noted 7/17 admission peri-MI, also noted again 10/17 admission. She did not tolerate amiodarone due to nausea/anorexia.  4. Mitral regurgitation: Moderate to severe on 10/17 echo, incomplete coaptation.  ?Etiology, her MI (anterior) does not typically lead to ischemic MR.  - Echo in 11/17 with severe MR, posterior leaflet restricted.  - TEE (8/18): EF 30%, moderately dilated LV with akinetic septal and anterior walls, moderate functional MR, normal RV size and systolic function.  - Echo (8/19): Moderate MR - Echo (3/23): Mild MR.  5. Hyperlipidemia: Myalgias with atorvastatin.  6. PVCs: Holter 12/18 with 41% PVCs.  - PVC ablation in 2/19 with recurrence of PVCs.  7. GI bleeding:  - EGD (4/21): unremarkable.  - Colonoscopy (7/21): Diverticulosis.  - Capsule endoscopy (9/21): negative.  -  Double balloon endoscopy at [facility] in 12/22 did not show cause for bleeding.   SH: Married, lives in [place], works for the [employer], no smoking or ETOH.   Family History  Problem Relation Age of Onset   Heart attack Father    Arrhythmia Mother    Lung cancer Mother        bronchiectasis   Colon cancer Neg Hx    Colon polyps Neg Hx    ROS: All systems reviewed and negative except as per HPI.   Current Outpatient Medications on File Prior to Encounter  Medication Sig Dispense Refill   acetaminophen (TYLENOL) 500 MG tablet Take 1,000 mg by mouth every 6 (six) hours as needed for mild pain.     buPROPion (WELLBUTRIN XL) 300 MG 24 hr tablet Take 1 tablet by mouth daily.     Calcium Carb-Cholecalciferol (CALCIUM + D3 PO) Take 1 tablet by mouth every morning.     Co-Enzyme Q10 100 MG CAPS Take 100 mg by mouth daily.     dexlansoprazole (DEXILANT) 60 MG capsule TAKE 1 CAPSULE(60 MG) BY MOUTH DAILY 90 capsule 3   ELIQUIS 5 MG TABS  tablet TAKE 1 TABLET(5 MG) BY MOUTH TWICE DAILY 180 tablet 3   ENTRESTO 24-26 MG TAKE 1 TABLET BY MOUTH TWICE DAILY 60 tablet 11   escitalopram (LEXAPRO) 10 MG tablet Take 1 tablet by mouth daily.     fexofenadine (ALLEGRA) 180 MG tablet Take 180 mg by mouth daily.     furosemide (LASIX) 20 MG tablet TAKE 2 TABLETS(40 MG) BY MOUTH DAILY 90 tablet 3   nitroGLYCERIN (NITROSTAT) 0.4 MG SL tablet PLACE 1 TABLET UNDER THE TONGUE EVERY 5 MINUTES AS NEEDED FOR CHEST PAIN 25 tablet 3   rosuvastatin (CRESTOR) 10 MG tablet TAKE 1 TABLET(10 MG) BY MOUTH DAILY 90 tablet 3   sotalol (BETAPACE) 160 MG tablet TAKE 1 TABLET BY MOUTH EVERY MORNING AND 1/2 TABLET EVERY EVENING 126 tablet 1   spironolactone (ALDACTONE) 25 MG tablet Take 1 tablet (25 mg total) by mouth daily. 30 tablet 11   thyroid (ARMOUR) 120 MG tablet Take 120 mg by mouth daily before breakfast.     traZODone (DESYREL) 50 MG tablet Take 0.5 tablets (25 mg total) by mouth at bedtime as needed for sleep. 30 tablet 2   triamcinolone (NASACORT) 55 MCG/ACT AERO nasal inhaler Place 2 sprays into the nose daily.      No current facility-administered medications on file prior to encounter.   Wt Readings from Last 3 Encounters:  04/04/22 73.9 kg (163 lb)  03/26/22 72.9 kg (160 lb 12.8 oz)  03/21/22 73 kg (161 lb)   BP 112/78   Pulse (!) 54   Ht 5' 9"$  (1.753 m)   Wt 73.9 kg (163 lb)   SpO2 99%   BMI 24.07 kg/m  Physical Exam General:  NAD. No resp difficulty, walked into clinic HEENT: Normal Neck: Supple. No JVD. Carotids 2+ bilat; no bruits. No lymphadenopathy or thryomegaly appreciated. Cor: PMI nondisplaced. Brady rate & rhythm. No rubs, gallops or murmurs. Lungs: Clear Abdomen: Soft, nontender, nondistended. No hepatosplenomegaly. No bruits or masses. Good bowel sounds. Extremities: No cyanosis, clubbing, rash, edema Neuro: Alert & oriented x 3, cranial nerves grossly intact. Moves all 4 extremities w/o difficulty. Affect  pleasant.  Assessment/Plan: 1. CAD: S/p anterior MI in 7/17 with DES to RCA.  Cath in 10/19 with no obstructive CAD.  No chest pain.  - She is off Plavix now  and taking apixaban 5 mg bid.  Recent CBC stable, no bleeding issues. - She is tolerating Crestor without myalgias.  Good lipids in 8/23.      2. Chronic systolic CHF: Ischemic cardiomyopathy.  EF 25% on echo in 10/17, 30-35% on echo in 11/17, 30% on TEE in 8/18. She has extensive LAD-territory scar. She has a [employer] ICD.  Echo in 3/23 showed that EF remained 25-30% with LAD-territory scar.  She is not volume overloaded on exam, still with NYHA III symptoms, continues with prominent fatigue.  GDMT limited by dizziness and low BP.   - Continue Lasix 40 mg daily. Recent labs stable, K 4.7, creatinine 1.06 - Continue Entresto 24/26 bid, no BP room to increase.  If symptoms not improved after CCM, consider switch to losartan. - Continue spironolactone 25 mg daily. - Off Coreg with low BP and addition of sotalol.   - She did not tolerate dapagliflozin due to yeast infections.   - QRS does not appear to be wide enough that she would have benefit from CRT.  - Insurance did not approve her for baroreceptor activation therapy.   - Planning on CCM implant 6-8 weeks after RV lead revision. 3. Mitral regurgitation: Moderate to severe on 10/17 echo, severe on 11/17 echo.  I did a TEE in 8/18.  This showed moderate MR that appeared to be functional.  No indication for surgery or Mitraclip. Echo in 3/23 showed mild MR.  4. Atrial fibrillation: Unable to tolerate amiodarone due to GI side effects.  She is regular on exam today on Eliquis and sotalol. QTc interval is acceptable on sotalol on ECG reviewed from 02/26/22.    5. PVCs: Very frequent on 12/18 holter (41% beats), she has had a PVC ablation in 2/19. Due to recurrence of PVCs post-ablation, she is now on sotalol. She does not seem to have many PVCs.  - Continue sotalol. 6. Hyperlipidemia: Tolerating  Crestor, good lipids in 8/23.  7. GI bleeding: With anemia.  She had episode in 10/22, has not had overt recurrence. She has been considered for Watchman.   - She wants to hold off on Watchman for now, will to consider with a close recurrence of GI bleeding.   Follow up in 3-4 months with Dr. Aundra Dubin.  Maricela Bo [facility] FNP-BC 04/04/2022

## 2022-04-04 ENCOUNTER — Encounter (HOSPITAL_COMMUNITY): Payer: Self-pay

## 2022-04-04 ENCOUNTER — Ambulatory Visit (HOSPITAL_COMMUNITY)
Admission: RE | Admit: 2022-04-04 | Discharge: 2022-04-04 | Disposition: A | Discharge status: Home / Self Care | Payer: BC Managed Care – PPO | Source: Ambulatory Visit | Attending: Family Medicine | Admitting: Family Medicine

## 2022-04-04 VITALS — BP 112/78 | HR 54 | Ht 69.0 in | Wt 163.0 lb

## 2022-04-04 DIAGNOSIS — Z955 Presence of coronary angioplasty implant and graft: Secondary | ICD-10-CM | POA: Diagnosis not present

## 2022-04-04 DIAGNOSIS — I340 Nonrheumatic mitral (valve) insufficiency: Secondary | ICD-10-CM | POA: Diagnosis not present

## 2022-04-04 DIAGNOSIS — I2510 Atherosclerotic heart disease of native coronary artery without angina pectoris: Secondary | ICD-10-CM | POA: Insufficient documentation

## 2022-04-04 DIAGNOSIS — Z8249 Family history of ischemic heart disease and other diseases of the circulatory system: Secondary | ICD-10-CM | POA: Diagnosis not present

## 2022-04-04 DIAGNOSIS — I5022 Chronic systolic (congestive) heart failure: Secondary | ICD-10-CM

## 2022-04-04 DIAGNOSIS — I252 Old myocardial infarction: Secondary | ICD-10-CM | POA: Insufficient documentation

## 2022-04-04 DIAGNOSIS — D649 Anemia, unspecified: Secondary | ICD-10-CM | POA: Insufficient documentation

## 2022-04-04 DIAGNOSIS — Z7901 Long term (current) use of anticoagulants: Secondary | ICD-10-CM | POA: Insufficient documentation

## 2022-04-04 DIAGNOSIS — I493 Ventricular premature depolarization: Secondary | ICD-10-CM

## 2022-04-04 DIAGNOSIS — I255 Ischemic cardiomyopathy: Secondary | ICD-10-CM | POA: Diagnosis not present

## 2022-04-04 DIAGNOSIS — Z95810 Presence of automatic (implantable) cardiac defibrillator: Secondary | ICD-10-CM | POA: Diagnosis not present

## 2022-04-04 DIAGNOSIS — Z79899 Other long term (current) drug therapy: Secondary | ICD-10-CM | POA: Insufficient documentation

## 2022-04-04 DIAGNOSIS — Z8719 Personal history of other diseases of the digestive system: Secondary | ICD-10-CM

## 2022-04-04 DIAGNOSIS — I480 Paroxysmal atrial fibrillation: Secondary | ICD-10-CM | POA: Diagnosis not present

## 2022-04-04 DIAGNOSIS — E785 Hyperlipidemia, unspecified: Secondary | ICD-10-CM | POA: Diagnosis not present

## 2022-04-04 NOTE — Patient Instructions (Signed)
Thank you for coming in today  Your physician recommends that you schedule a follow-up appointment in:  3-4 months with Dr. Kendall Flack will receive a reminder letter in the mail a few months in advance. If you don't receive a letter, please call our office to schedule the follow-up appointment.     Do the following things EVERYDAY: Weigh yourself in the morning before breakfast. Write it down and keep it in a log. Take your medicines as prescribed Eat low salt foods--Limit salt (sodium) to 2000 mg per day.  Stay as active as you can everyday Limit all fluids for the day to less than 2 liters  At the [facility], you and your health needs are our priority. As part of our continuing mission to provide you with exceptional heart care, we have created designated Provider Care Teams. These Care Teams include your primary Cardiologist (physician) and Advanced Practice Providers (APPs- Physician Assistants and Nurse Practitioners) who all work together to provide you with the care you need, when you need it.   You may see any of the following providers on your designated Care Team at your next follow up: Dr Glori Bickers Dr Loralie Champagne Dr. Roxana Hires, NP Lyda Jester, [place] [facility] [place], [place] [name], NP Audry Riles, PharmD   Please be sure to bring in all your medications bottles to every appointment.    Thank you for choosing [facility]   If you have any questions or concerns before your next appointment please send [place] a message through [place] or call our office at [phone].    TO LEAVE A MESSAGE FOR THE NURSE SELECT OPTION 2, PLEASE LEAVE A MESSAGE INCLUDING: YOUR NAME DATE OF BIRTH CALL BACK NUMBER REASON FOR CALL**this is important as we prioritize the call backs  YOU WILL RECEIVE A CALL BACK THE SAME DAY AS LONG AS YOU CALL BEFORE 4:00 PM

## 2022-04-07 ENCOUNTER — Telehealth (HOSPITAL_COMMUNITY): Payer: Self-pay | Admitting: *Deleted

## 2022-04-07 NOTE — Telephone Encounter (Signed)
Reaching out to patient to offer assistance regarding upcoming cardiac imaging study; pt verbalizes understanding of appt date/time, parking situation and where to check in, pre-test NPO status, and verified current allergies; name and call back number provided for further questions should they arise  Gordy Clement RN Navigator Cardiac [place] [facility] V[facility] [phone] office [phone] cell  Patient aware to arrive at 10:30am. Verified her ICD is in her LEFT chest.

## 2022-04-08 ENCOUNTER — Ambulatory Visit (HOSPITAL_COMMUNITY)
Admission: RE | Admit: 2022-04-08 | Discharge: 2022-04-08 | Disposition: A | Discharge status: Home / Self Care | Payer: BC Managed Care – PPO | Source: Ambulatory Visit | Attending: Cardiology | Admitting: Cardiology

## 2022-04-08 DIAGNOSIS — I480 Paroxysmal atrial fibrillation: Secondary | ICD-10-CM | POA: Insufficient documentation

## 2022-04-08 DIAGNOSIS — Z79899 Other long term (current) drug therapy: Secondary | ICD-10-CM | POA: Diagnosis present

## 2022-04-08 DIAGNOSIS — Z95810 Presence of automatic (implantable) cardiac defibrillator: Secondary | ICD-10-CM | POA: Insufficient documentation

## 2022-04-08 DIAGNOSIS — I5022 Chronic systolic (congestive) heart failure: Secondary | ICD-10-CM | POA: Diagnosis present

## 2022-04-08 DIAGNOSIS — T82198A Other mechanical complication of other cardiac electronic device, initial encounter: Secondary | ICD-10-CM | POA: Diagnosis present

## 2022-04-08 DIAGNOSIS — I493 Ventricular premature depolarization: Secondary | ICD-10-CM | POA: Diagnosis present

## 2022-04-08 MED ORDER — IOHEXOL 350 MG/ML SOLN
100.0000 mL | Freq: Once | INTRAVENOUS | Status: AC | PRN
  Administered 2022-04-08: 100 mL via INTRAVENOUS

## 2022-04-11 ENCOUNTER — Encounter (HOSPITAL_COMMUNITY): Payer: Self-pay | Admitting: Cardiology

## 2022-04-11 ENCOUNTER — Other Ambulatory Visit: Payer: Self-pay

## 2022-04-11 NOTE — Anesthesia Preprocedure Evaluation (Signed)
Anesthesia Evaluation  Patient identified by MRN, date of birth, ID band Patient awake    Reviewed: Allergy & Precautions, H&P , NPO status , Patient's Chart, lab work & pertinent test results  Airway Mallampati: II   Neck ROM: full    Dental   Pulmonary neg pulmonary ROS   breath sounds clear to auscultation       Cardiovascular hypertension, Pt. on medications + CAD, + Past MI and +CHF  + pacemaker + Cardiac Defibrillator  Rhythm:regular Rate:Normal  Ischemic cardiomyopathy. EF 25-30%. Akinesis of septum and apex   Neuro/Psych  PSYCHIATRIC DISORDERS  Depression       GI/Hepatic ,GERD  ,,  Endo/Other  Hypothyroidism    Renal/GU Renal InsufficiencyRenal disease     Musculoskeletal   Abdominal   Peds  Hematology   Anesthesia Other Findings   Reproductive/Obstetrics                             Anesthesia Physical Anesthesia Plan  ASA: 4  Anesthesia Plan: General   Post-op Pain Management: Ofirmev IV (intra-op)* and Minimal or no pain anticipated   Induction: Intravenous  PONV Risk Score and Plan: 3 and Ondansetron, Dexamethasone, Midazolam and Treatment may vary due to age or medical condition  Airway Management Planned: Oral ETT  Additional Equipment: Arterial line and TEE  Intra-op Plan:   Post-operative Plan: Extubation in OR  Informed Consent: I have reviewed the patients History and Physical, chart, labs and discussed the procedure including the risks, benefits and alternatives for the proposed anesthesia with the patient or authorized representative who has indicated his/her understanding and acceptance.     Dental advisory given  Plan Discussed with: CRNA, Anesthesiologist and Surgeon  Anesthesia Plan Comments: (  )       Anesthesia Quick Evaluation

## 2022-04-11 NOTE — Progress Notes (Signed)
PCP - Allyn Kenner MD Cardiologist - Peri Maris Lean MD  EKG - 02/26/2022 Chest x-ray - 12/17/2017 ECHO - 05/08/2021 Cardiac Cath - First 09/20/2015.  Last 12/18/2017 CPAP - Denies  DM- Denies.  Hx of pre daibetes  Blood Thinner Instructions: Eliquis.  Per MD note to hold Eliquis las dose to be taken on Thursday Feb 15. Pt verified that last dose was taken yesterday.  Aspirin Instructions: N/A  ERAS Protcol - No orders at this time.  Per MD Note NPO COVID TEST- DOS  Anesthesia review: Yes, cardiac hx.  -------------  SDW INSTRUCTIONS:  Your procedure is scheduled on Monday Feb 16 th. Please report to [facility]trance "A" at 12:30 P.M., and check in at the Admitting office. Call this number if you have problems the morning of surgery: [phone]   Remember: Do not eat or drink after midnight the night before your surgery     Medications to take morning of surgery with a sip of water include: Per MD note to hold all medications DOS  As of today, STOP taking any Aspirin (unless otherwise instructed by your surgeon), Aleve, Naproxen, Ibuprofen, Motrin, Advil, Goody's, BC's, all herbal medications, fish oil, and all vitamins.    The Morning of Surgery Do not wear jewelry, make-up or nail polish. Do not wear lotions, powders, or perfumes/colognes, or deodorant Do not bring valuables to the hospital. [facility] is not responsible for any belongings or valuables.  If you are a smoker, DO NOT Smoke 24 hours prior to surgery  If you wear a CPAP at night please bring your mask the morning of surgery   Remember that you must have someone to transport you home after your surgery, and remain with you for 24 hours if you are discharged the same day.  Please bring cases for contacts, glasses, hearing aids, dentures or bridgework because it cannot be worn into surgery.   Patients discharged the day of surgery will not be allowed to drive home.   Please shower the NIGHT  BEFORE/MORNING OF SURGERY (use antibacterial soap like DIAL soap if possible). Wear comfortable clothes the morning of surgery. Oral Hygiene is also important to reduce your risk of infection.  Remember - BRUSH YOUR TEETH THE MORNING OF SURGERY WITH YOUR REGULAR TOOTHPASTE  Patient denies shortness of breath, fever, cough and chest pain.

## 2022-04-11 NOTE — Progress Notes (Signed)
Anesthesia Chart Review: Catherine Mays  Case: [id number] Date/Time: 04/14/22 1530   Procedures:      LEAD EXTRACTION     ICD GENERATOR CHANGEOUT   Anesthesia type: General   Pre-op diagnosis: lead failure   Location: [place] CATH LAB 6 / [place] INVASIVE CV LAB   Providers: Vickie Epley, MD       DISCUSSION: Patient is a 57 year old female scheduled for the above procedure. She had an ICD placed 01/23/16 for ischemic CM post anterior STEMI. Per 03/21/22 EP note, "Her RV lead impedance and pacing threshold have increased significantly on monitoring. She is being referred to discuss extraction and reimplant of her ICD lead."  History includes never smoker, CAD (anterior STEMI, s/p DES mid LAD 09/20/15), ischemic cardiomyopathy, HFrEF, ICD (St. Jude 01/23/16), PAF (peri-MI 08/2015), PVCs (s/p ablation 04/02/17), mitral regurgitation, GI bleed 11/2020 (no definite source found), thyroidectomy, hypothyroidism, hypotension, pre-diabetes, CKD (stage III).   Patient reported last Eliquis 04/10/2022.  She is suppose to arrive 3 hours prior to procedure. Anesthesia team to evaluate on the day of surgery. She has a Sport and exercise psychologist. Jude device. CT Surgeon Dr. Kipp Brood is back-up surgeon for case.   VS:  BP Readings from Last 3 Encounters:  04/08/22 (!) 89/69  04/04/22 112/78  03/26/22 100/70   Pulse Readings from Last 3 Encounters:  04/08/22 (!) 53  04/04/22 (!) 54  03/26/22 (!) 57     PROVIDERS: Celene Squibb, MD is PCP Loralie Champagne, MD is HF cardiologist Lars Mage, MD is EP   LABS: Most recent labs results in [facility] include: Lab Results  Component Value Date   WBC 5.2 03/21/2022   HGB 12.4 03/21/2022   HCT 36.0 03/21/2022   PLT 175 03/21/2022   GLUCOSE 86 03/21/2022   NA 139 03/21/2022   K 4.7 03/21/2022   CL 102 03/21/2022   CREATININE 1.06 (H) 03/21/2022   BUN 24 03/21/2022   CO2 23 03/21/2022    EKG: 02/26/2022: Sinus bradycardia at 50 bpm.  Left axis deviation.  Nonspecific  intraventricular block.  Cannot rule out anterior infarct, age undetermined.   CV: Echo 05/08/21: IMPRESSIONS   1. Left ventricular ejection fraction, by estimation, is 25 to 30%. The  left ventricle has severely decreased function. The left ventricle  demonstrates regional wall motion abnormalities with akinesis of the  inferoseptal and anteroseptal walls, akinesis   of the apical lateral wall, the apical inferior wall, and the apical  anterior wall. Akinesis of the apex. No LV thrombus. The left ventricular  internal cavity size was mildly dilated. Left ventricular diastolic  parameters are consistent with Grade I  diastolic dysfunction (impaired relaxation).   2. Right ventricular systolic function is normal. The right ventricular  size is normal. Tricuspid regurgitation signal is inadequate for assessing  PA pressure.   3. The mitral valve is normal in structure. Mild mitral valve  regurgitation. No evidence of mitral stenosis.   4. The aortic valve is tricuspid. Aortic valve regurgitation is not  visualized. No aortic stenosis is present.   5. The inferior vena cava is normal in size with greater than 50%  respiratory variability, suggesting right atrial pressure of 3 mmHg.   US Carotid 05/08/21: Summary:  - Right Carotid: The extracranial vessels were near-normal with only minimal wall thickening or plaque.  - Left Carotid: The extracranial vessels were near-normal with only minimal wall thickening or plaque.  - Vertebrals:  Bilateral vertebral arteries demonstrate antegrade flow.  - Subclavians:  Normal flow hemodynamics were seen in bilateral subclavian arteries.   LHC 12/18/17: No obstructive coronary disease, patent LAD stent.   [place] 11/26/15: 1. Elevated PCWP with prominent v-waves suggestive of significant mitral regurgitation.  2. Normal right atrial pressure.  3. Pulmonary venous hypertension.  4. Preserved cardiac output.   Past Medical History:  Diagnosis Date    AICD (automatic cardioverter/defibrillator) present    St. Jude   Anemia    CAD in native artery    a. late recognition of presentation of STEMI 08/2015 s/p DES to LAD, mild residual mRCA.   Chronic systolic CHF (congestive heart failure) (HCC)    CKD (chronic kidney disease), stage III (HCC)    Depression    Hypertension    Hypotension    a. preventing med titration for HF.   Hypothyroidism 09/24/2015   Ischemic cardiomyopathy    Myocardial infarction [facility]) 08/2015   PAF (paroxysmal atrial fibrillation) ([place]) 09/24/2015   a. dx at time of STEMI 08/2015.   Pre-diabetes    Presence of permanent cardiac pacemaker    patient has ICD   PVC's (premature ventricular contractions)     Past Surgical History:  Procedure Laterality Date   BIOPSY  06/16/2019   Procedure: BIOPSY;  Surgeon: Daneil Dolin, MD;  [facility];  Service: Endoscopy;;   CARDIAC CATHETERIZATION N/A 09/20/2015   Procedure: Left Heart Cath and Coronary Angiography;  Surgeon: Burnell Blanks, MD;  Location: [place] CV LAB;  Service: Cardiovascular;  Laterality: N/A;   CARDIAC CATHETERIZATION N/A 09/20/2015   Procedure: Coronary Stent Intervention;  Surgeon: Burnell Blanks, MD;  Location: [place] CV LAB;  Service: Cardiovascular;  Laterality: N/A;   CARDIAC CATHETERIZATION N/A 09/21/2015   Procedure: Left Heart Cath and Coronary Angiography;  Surgeon: Sherren Mocha, MD;  Location: [place] CV LAB;  Service: Cardiovascular;  Laterality: N/A;   CARDIAC CATHETERIZATION N/A 11/26/2015   Procedure: Right Heart Cath;  Surgeon: Larey Dresser, MD;  Location: [place] CV LAB;  Service: Cardiovascular;  Laterality: N/A;   COLONOSCOPY WITH PROPOFOL N/A 11/23/2017   Dr. Gala Romney: Diverticulosis, internal hemorrhoids, next colonoscopy in 10 years   COLONOSCOPY WITH PROPOFOL N/A 08/10/2019   Procedure: COLONOSCOPY WITH PROPOFOL;  Surgeon: Daneil Dolin, MD;  Diverticulosis in sigmoid colon,  nonbleeding internal hemorrhoids, otherwise normal exam.     COLONOSCOPY WITH PROPOFOL N/A 12/02/2020   Procedure: COLONOSCOPY WITH PROPOFOL;  Surgeon: Carol Ada, MD;  Location: [place];  Service: Endoscopy;  Laterality: N/A;   CORONARY STENT PLACEMENT  09/20/2015   EP IMPLANTABLE DEVICE N/A 01/23/2016   Procedure: ICD Implant;  Surgeon: Will Meredith Leeds, MD;  Location: [place] CV LAB;  Service: Cardiovascular;  Laterality: N/A;   ESOPHAGOGASTRODUODENOSCOPY (EGD) WITH PROPOFOL N/A 06/16/2019   Procedure: ESOPHAGOGASTRODUODENOSCOPY (EGD) WITH PROPOFOL;  Surgeon: Daneil Dolin, MD;  Normal esophagus, small hiatal hernia, normal examined stomach, normal examined duodenum.  Gastric biopsy with slight chronic inflammation, duodenal biopsy with slight intramucosal Brunner gland hyperplasia.   ESOPHAGOGASTRODUODENOSCOPY (EGD) WITH PROPOFOL N/A 11/30/2020   Procedure: ESOPHAGOGASTRODUODENOSCOPY (EGD) WITH PROPOFOL;  Surgeon: Daryel November, MD;  Location: [place];  Service: Gastroenterology;  Laterality: N/A;   GIVENS CAPSULE STUDY N/A 11/23/2019    Surgeon: Daneil Dolin, MD; essentially unremarkable except for tiny erosion in the proximal small bowel.   GIVENS CAPSULE STUDY  11/30/2020   Procedure: GIVENS CAPSULE STUDY;  Surgeon: Daryel November, MD;  Location: [place];  Service:  Gastroenterology;;   LEFT HEART CATH AND CORONARY ANGIOGRAPHY N/A 12/18/2017   Procedure: LEFT HEART CATH AND CORONARY ANGIOGRAPHY;  Surgeon: Larey Dresser, MD;  Location: [place] CV LAB;  Service: Cardiovascular;  Laterality: N/A;   PVC ABLATION N/A 04/02/2017   Procedure: PVC ABLATION;  Surgeon: Constance Haw, MD;  Location: [place] CV LAB;  Service: Cardiovascular;  Laterality: N/A;   TEE WITHOUT CARDIOVERSION N/A 10/08/2016   Procedure: TRANSESOPHAGEAL ECHOCARDIOGRAM (TEE);  Surgeon: Larey Dresser, MD;  Location: [facility] ENDOSCOPY;  Service: Cardiovascular;  Laterality: N/A;    THYROIDECTOMY      MEDICATIONS: No current facility-administered medications for this encounter.    acetaminophen (TYLENOL) 500 MG tablet   buPROPion (WELLBUTRIN XL) 300 MG 24 hr tablet   Calcium Carb-Cholecalciferol (CALCIUM + D3 PO)   Coenzyme Q10 (COQ-10) 200 MG CAPS   dexlansoprazole (DEXILANT) 60 MG capsule   ELIQUIS 5 MG TABS tablet   ENTRESTO 24-26 MG   escitalopram (LEXAPRO) 10 MG tablet   ferrous sulfate 325 (65 FE) MG EC tablet   fexofenadine (ALLEGRA) 180 MG tablet   furosemide (LASIX) 20 MG tablet   nitroGLYCERIN (NITROSTAT) 0.4 MG SL tablet   rosuvastatin (CRESTOR) 10 MG tablet   sotalol (BETAPACE) 160 MG tablet   spironolactone (ALDACTONE) 25 MG tablet   thyroid (ARMOUR) 120 MG tablet   traZODone (DESYREL) 50 MG tablet   triamcinolone (NASACORT) 55 MCG/ACT AERO nasal inhaler    Myra Gianotti, PA-C Surgical Short Stay/Anesthesiology [facility] Phone [phone] [facility] Phone [phone] 04/11/2022 1:37 PM

## 2022-04-14 ENCOUNTER — Ambulatory Visit (HOSPITAL_COMMUNITY): Payer: BC Managed Care – PPO | Admitting: Vascular Surgery

## 2022-04-14 ENCOUNTER — Encounter (HOSPITAL_COMMUNITY): Payer: Self-pay | Admitting: Cardiology

## 2022-04-14 ENCOUNTER — Ambulatory Visit (HOSPITAL_COMMUNITY): Payer: BC Managed Care – PPO

## 2022-04-14 ENCOUNTER — Ambulatory Visit (HOSPITAL_COMMUNITY)
Admission: RE | Admit: 2022-04-14 | Discharge: 2022-04-15 | Disposition: A | Discharge status: Home / Self Care | Payer: BC Managed Care – PPO | Attending: Cardiology | Admitting: Cardiology

## 2022-04-14 ENCOUNTER — Other Ambulatory Visit: Payer: Self-pay

## 2022-04-14 ENCOUNTER — Encounter (HOSPITAL_COMMUNITY)
Admission: RE | Disposition: A | Discharge status: Home / Self Care | Payer: Self-pay | Source: Home / Self Care | Attending: Cardiology

## 2022-04-14 DIAGNOSIS — I480 Paroxysmal atrial fibrillation: Secondary | ICD-10-CM | POA: Insufficient documentation

## 2022-04-14 DIAGNOSIS — I509 Heart failure, unspecified: Secondary | ICD-10-CM

## 2022-04-14 DIAGNOSIS — Z95810 Presence of automatic (implantable) cardiac defibrillator: Secondary | ICD-10-CM | POA: Diagnosis present

## 2022-04-14 DIAGNOSIS — I2510 Atherosclerotic heart disease of native coronary artery without angina pectoris: Secondary | ICD-10-CM | POA: Insufficient documentation

## 2022-04-14 DIAGNOSIS — Z1152 Encounter for screening for COVID-19: Secondary | ICD-10-CM | POA: Diagnosis not present

## 2022-04-14 DIAGNOSIS — I255 Ischemic cardiomyopathy: Secondary | ICD-10-CM | POA: Diagnosis not present

## 2022-04-14 DIAGNOSIS — I252 Old myocardial infarction: Secondary | ICD-10-CM | POA: Insufficient documentation

## 2022-04-14 DIAGNOSIS — I5022 Chronic systolic (congestive) heart failure: Secondary | ICD-10-CM | POA: Insufficient documentation

## 2022-04-14 DIAGNOSIS — I493 Ventricular premature depolarization: Secondary | ICD-10-CM | POA: Diagnosis not present

## 2022-04-14 DIAGNOSIS — T82110A Breakdown (mechanical) of cardiac electrode, initial encounter: Secondary | ICD-10-CM | POA: Insufficient documentation

## 2022-04-14 DIAGNOSIS — Z01818 Encounter for other preprocedural examination: Secondary | ICD-10-CM

## 2022-04-14 DIAGNOSIS — Z8249 Family history of ischemic heart disease and other diseases of the circulatory system: Secondary | ICD-10-CM | POA: Diagnosis not present

## 2022-04-14 DIAGNOSIS — Y831 Surgical operation with implant of artificial internal device as the cause of abnormal reaction of the patient, or of later complication, without mention of misadventure at the time of the procedure: Secondary | ICD-10-CM | POA: Diagnosis not present

## 2022-04-14 DIAGNOSIS — T82110D Breakdown (mechanical) of cardiac electrode, subsequent encounter: Secondary | ICD-10-CM

## 2022-04-14 HISTORY — PX: ICD GENERATOR CHANGEOUT: EP1231

## 2022-04-14 HISTORY — DX: Essential (primary) hypertension: I10

## 2022-04-14 HISTORY — DX: Depression, unspecified: F32.A

## 2022-04-14 HISTORY — DX: Anemia, unspecified: D64.9

## 2022-04-14 HISTORY — PX: LEAD EXTRACTION: EP1211

## 2022-04-14 LAB — ECHO INTRAOPERATIVE TEE
Height: 69 in
Weight: 2560 oz

## 2022-04-14 LAB — SURGICAL PCR SCREEN
MRSA, PCR: NEGATIVE
Staphylococcus aureus: NEGATIVE

## 2022-04-14 LAB — ECHO TEE: Weight: 2560 oz

## 2022-04-14 LAB — SARS CORONAVIRUS 2 BY RT PCR: SARS Coronavirus 2 by RT PCR: NEGATIVE

## 2022-04-14 LAB — PREPARE RBC (CROSSMATCH)

## 2022-04-14 SURGERY — LEAD EXTRACTION
Anesthesia: General

## 2022-04-14 MED ORDER — SOTALOL HCL 80 MG PO TABS
160.0000 mg | ORAL_TABLET | Freq: Every morning | ORAL | Status: DC
  Administered 2022-04-15: 160 mg via ORAL
  Filled 2022-04-14: qty 2

## 2022-04-14 MED ORDER — SODIUM CHLORIDE 0.9% IV SOLUTION: Freq: Once | INTRAVENOUS | Status: DC

## 2022-04-14 MED ORDER — HEPARIN (PORCINE) IN NACL 1000-0.9 UT/500ML-% IV SOLN
INTRAVENOUS | Status: DC | PRN
  Administered 2022-04-14: 500 mL

## 2022-04-14 MED ORDER — VANCOMYCIN HCL IN DEXTROSE 1-5 GM/200ML-% IV SOLN
1000.0000 mg | INTRAVENOUS | Status: AC
  Administered 2022-04-14: 1000 mg via INTRAVENOUS

## 2022-04-14 MED ORDER — NALBUPHINE HCL 10 MG/ML IJ SOLN
5.0000 mg | Freq: Once | INTRAMUSCULAR | Status: AC
  Administered 2022-04-14: 5 mg via INTRAVENOUS
  Filled 2022-04-14: qty 0.5

## 2022-04-14 MED ORDER — CHLORHEXIDINE GLUCONATE 0.12 % MT SOLN
OROMUCOSAL | Status: AC
  Administered 2022-04-14: 15 mL via OROMUCOSAL
  Filled 2022-04-14: qty 15

## 2022-04-14 MED ORDER — VANCOMYCIN HCL IN DEXTROSE 1-5 GM/200ML-% IV SOLN
1000.0000 mg | Freq: Two times a day (BID) | INTRAVENOUS | Status: AC
  Filled 2022-04-14: qty 200

## 2022-04-14 MED ORDER — DIPHENHYDRAMINE HCL 50 MG/ML IJ SOLN
12.5000 mg | Freq: Once | INTRAMUSCULAR | Status: AC
  Administered 2022-04-14: 12.5 mg via INTRAVENOUS

## 2022-04-14 MED ORDER — ESCITALOPRAM OXALATE 10 MG PO TABS
10.0000 mg | ORAL_TABLET | Freq: Every day | ORAL | Status: DC
  Administered 2022-04-15: 10 mg via ORAL
  Filled 2022-04-14: qty 1

## 2022-04-14 MED ORDER — ACETAMINOPHEN 10 MG/ML IV SOLN
1000.0000 mg | Freq: Once | INTRAVENOUS | Status: AC
  Filled 2022-04-14: qty 100

## 2022-04-14 MED ORDER — BUPROPION HCL ER (XL) 150 MG PO TB24
150.0000 mg | ORAL_TABLET | Freq: Every day | ORAL | Status: DC
  Administered 2022-04-14 – 2022-04-15 (×2): 150 mg via ORAL
  Filled 2022-04-14 (×2): qty 1

## 2022-04-14 MED ORDER — DIPHENHYDRAMINE HCL 50 MG/ML IJ SOLN
INTRAMUSCULAR | Status: AC
  Filled 2022-04-14: qty 1

## 2022-04-14 MED ORDER — HYDROMORPHONE HCL 1 MG/ML IJ SOLN
INTRAMUSCULAR | Status: AC
  Administered 2022-04-14: 0.5 mg via INTRAVENOUS
  Filled 2022-04-14: qty 1

## 2022-04-14 MED ORDER — PROPOFOL 10 MG/ML IV BOLUS
INTRAVENOUS | Status: DC | PRN
  Administered 2022-04-14: 20 mg via INTRAVENOUS
  Administered 2022-04-14: 30 mg via INTRAVENOUS
  Administered 2022-04-14: 130 mg via INTRAVENOUS

## 2022-04-14 MED ORDER — ROSUVASTATIN CALCIUM 5 MG PO TABS
10.0000 mg | ORAL_TABLET | Freq: Every day | ORAL | Status: DC
  Administered 2022-04-15: 10 mg via ORAL
  Filled 2022-04-14: qty 2

## 2022-04-14 MED ORDER — VANCOMYCIN HCL IN DEXTROSE 1-5 GM/200ML-% IV SOLN
INTRAVENOUS | Status: AC
  Administered 2022-04-15: 1000 mg via INTRAVENOUS
  Filled 2022-04-14: qty 200

## 2022-04-14 MED ORDER — FENTANYL CITRATE (PF) 100 MCG/2ML IJ SOLN
INTRAMUSCULAR | Status: AC
  Administered 2022-04-14: 25 ug via INTRAVENOUS
  Filled 2022-04-14: qty 2

## 2022-04-14 MED ORDER — SODIUM CHLORIDE 0.9 % IV SOLN: INTRAVENOUS | Status: DC

## 2022-04-14 MED ORDER — POVIDONE-IODINE 10 % EX SWAB
2.0000 | Freq: Once | CUTANEOUS | Status: AC
  Administered 2022-04-14: 2 via TOPICAL

## 2022-04-14 MED ORDER — DEXAMETHASONE SODIUM PHOSPHATE 10 MG/ML IJ SOLN
INTRAMUSCULAR | Status: DC | PRN
  Administered 2022-04-14: 5 mg via INTRAVENOUS

## 2022-04-14 MED ORDER — LIDOCAINE 2% (20 MG/ML) 5 ML SYRINGE
INTRAMUSCULAR | Status: DC | PRN
  Administered 2022-04-14: 75 mg via INTRAVENOUS

## 2022-04-14 MED ORDER — SODIUM CHLORIDE 0.9 % IV SOLN
INTRAVENOUS | Status: AC
  Filled 2022-04-14: qty 2

## 2022-04-14 MED ORDER — CHLORHEXIDINE GLUCONATE 4 % EX LIQD
4.0000 | Freq: Once | CUTANEOUS | Status: DC
  Filled 2022-04-14: qty 60

## 2022-04-14 MED ORDER — EPHEDRINE SULFATE-NACL 50-0.9 MG/10ML-% IV SOSY
PREFILLED_SYRINGE | INTRAVENOUS | Status: DC | PRN
  Administered 2022-04-14: 10 mg via INTRAVENOUS

## 2022-04-14 MED ORDER — SPIRONOLACTONE 25 MG PO TABS
25.0000 mg | ORAL_TABLET | Freq: Every day | ORAL | Status: DC
  Administered 2022-04-15: 25 mg via ORAL
  Filled 2022-04-14: qty 1

## 2022-04-14 MED ORDER — HYDROMORPHONE HCL 1 MG/ML IJ SOLN
0.5000 mg | INTRAMUSCULAR | Status: DC | PRN
  Administered 2022-04-14: 0.5 mg via INTRAVENOUS

## 2022-04-14 MED ORDER — ACETAMINOPHEN 10 MG/ML IV SOLN
INTRAVENOUS | Status: AC
  Administered 2022-04-14: 1000 mg via INTRAVENOUS
  Filled 2022-04-14: qty 100

## 2022-04-14 MED ORDER — SACUBITRIL-VALSARTAN 24-26 MG PO TABS
1.0000 | ORAL_TABLET | Freq: Two times a day (BID) | ORAL | Status: DC
  Administered 2022-04-15: 1 via ORAL
  Filled 2022-04-14: qty 1

## 2022-04-14 MED ORDER — CHLORHEXIDINE GLUCONATE 0.12 % MT SOLN
15.0000 mL | Freq: Once | OROMUCOSAL | Status: AC
  Filled 2022-04-14: qty 15

## 2022-04-14 MED ORDER — SODIUM CHLORIDE 0.9 % IV SOLN
80.0000 mg | INTRAVENOUS | Status: AC
  Administered 2022-04-14: 80 mg

## 2022-04-14 MED ORDER — FENTANYL CITRATE (PF) 100 MCG/2ML IJ SOLN
INTRAMUSCULAR | Status: AC
  Filled 2022-04-14: qty 2

## 2022-04-14 MED ORDER — SODIUM CHLORIDE 0.9 % IV SOLN: INTRAVENOUS | Status: DC | PRN

## 2022-04-14 MED ORDER — ONDANSETRON HCL 4 MG/2ML IJ SOLN: 4.0000 mg | Freq: Four times a day (QID) | INTRAMUSCULAR | Status: DC | PRN

## 2022-04-14 MED ORDER — SOTALOL HCL 80 MG PO TABS
80.0000 mg | ORAL_TABLET | Freq: Every day | ORAL | Status: DC
  Administered 2022-04-14: 80 mg via ORAL
  Filled 2022-04-14: qty 1

## 2022-04-14 MED ORDER — ROCURONIUM BROMIDE 10 MG/ML (PF) SYRINGE
PREFILLED_SYRINGE | INTRAVENOUS | Status: DC | PRN
  Administered 2022-04-14: 40 mg via INTRAVENOUS

## 2022-04-14 MED ORDER — FENTANYL CITRATE (PF) 100 MCG/2ML IJ SOLN
INTRAMUSCULAR | Status: DC | PRN
  Administered 2022-04-14 (×2): 50 ug via INTRAVENOUS

## 2022-04-14 MED ORDER — ONDANSETRON HCL 4 MG/2ML IJ SOLN
INTRAMUSCULAR | Status: DC | PRN
  Administered 2022-04-14: 4 mg via INTRAVENOUS

## 2022-04-14 MED ORDER — FUROSEMIDE 40 MG PO TABS
40.0000 mg | ORAL_TABLET | Freq: Every day | ORAL | Status: DC
  Administered 2022-04-15: 40 mg via ORAL
  Filled 2022-04-14: qty 1

## 2022-04-14 MED ORDER — FENTANYL CITRATE (PF) 100 MCG/2ML IJ SOLN
25.0000 ug | INTRAMUSCULAR | Status: DC | PRN
  Administered 2022-04-14 (×3): 25 ug via INTRAVENOUS

## 2022-04-14 MED ORDER — LACTATED RINGERS IV SOLN: INTRAVENOUS | Status: DC

## 2022-04-14 SURGICAL SUPPLY — 19 items
BRIDGE PREP KIT (KITS) ×1
CABLE SURGICAL S-101-97-12 (CABLE) ×1 IMPLANT
COVER DOME SNAP 22 D (MISCELLANEOUS) IMPLANT
DEVICE LCKNG LEAD CARDIAC (CATHETERS) IMPLANT
ICD GALLANT VR CDVRA500Q (ICD Generator) IMPLANT
KIT BRIDGE PREP (KITS) IMPLANT
LEAD DURATA 7122Q-58CM (Lead) IMPLANT
PAD DEFIB RADIO PHYSIO CONN (PAD) ×1 IMPLANT
POUCH AIGIS-R ANTIBACT ICD (Mesh General) ×1 IMPLANT
POUCH AIGIS-R ANTIBACT ICD LRG (Mesh General) IMPLANT
REMOVAL LLD CARDIAC LEAD EZ (CATHETERS) ×1
SHEATH 7FR PRELUDE SNAP 13 (SHEATH) IMPLANT
SHEATH 8FR PRELUDE SNAP 25 (SHEATH) IMPLANT
SHEATH LASER 14 FR GLIDELIGHT (SHEATH) IMPLANT
SHEATH PINNACLE 6F 10CM (SHEATH) IMPLANT
SHEATH PROBE COVER 6X72 (BAG) IMPLANT
SHEATH TIGHTRAIL MECH 13F (SHEATH) IMPLANT
TRAY PACEMAKER INSERTION (PACKS) ×1 IMPLANT
WIRE HI TORQ VERSACORE-J 145CM (WIRE) IMPLANT

## 2022-04-14 NOTE — Transfer of Care (Signed)
Immediate Anesthesia Transfer of Care Note  Patient: Catherine Mays  Procedure(s) Performed: LEAD EXTRACTION ICD GENERATOR CHANGEOUT  Patient Location: PACU  Anesthesia Type:General  Level of Consciousness: drowsy and patient cooperative  Airway & Oxygen Therapy: Patient Spontanous Breathing and Patient connected to nasal cannula oxygen  Post-op Assessment: Report given to RN, Post -op Vital signs reviewed and stable, and Patient moving all extremities  Post vital signs: Reviewed and stable  Last Vitals:  Vitals Value Taken Time  BP 115/80 04/14/22 1822  Temp    Pulse 74 04/14/22 1827  Resp 14 04/14/22 1827  SpO2 95 % 04/14/22 1827  Vitals shown include unvalidated device data.  Last Pain:  Vitals:   04/14/22 1252  TempSrc:   PainSc: 0-No pain         Complications: There were no known notable events for this encounter.

## 2022-04-14 NOTE — Anesthesia Procedure Notes (Signed)
Procedure Name: Intubation Date/Time: 04/14/2022 3:36 PM  Performed by: Moshe Salisbury, CRNAPre-anesthesia Checklist: Patient identified, Emergency Drugs available, Suction available and Patient being monitored Patient Re-evaluated:Patient Re-evaluated prior to induction Oxygen Delivery Method: Circle System Utilized Preoxygenation: Pre-oxygenation with 100% oxygen Induction Type: IV induction Ventilation: Mask ventilation without difficulty Laryngoscope Size: Mac and 3 Grade View: Grade I Tube type: Oral Tube size: 7.5 mm Number of attempts: 1 Airway Equipment and Method: Stylet Placement Confirmation: ETT inserted through vocal cords under direct vision, positive ETCO2 and breath sounds checked- equal and bilateral Secured at: 22 cm Tube secured with: Tape Dental Injury: Teeth and Oropharynx as per pre-operative assessment

## 2022-04-14 NOTE — Anesthesia Postprocedure Evaluation (Signed)
Anesthesia Post Note  Patient: Catherine Mays  Procedure(s) Performed: LEAD EXTRACTION ICD [place]     Patient location during evaluation: PACU Anesthesia Type: General Level of consciousness: awake and alert Pain management: pain level controlled Vital Signs Assessment: post-procedure vital signs reviewed and stable Respiratory status: spontaneous breathing, nonlabored ventilation, respiratory function stable and patient connected to nasal cannula oxygen Cardiovascular status: blood pressure returned to baseline and stable Postop Assessment: no apparent nausea or vomiting Anesthetic complications: no   There were no known notable events for this encounter.  Last Vitals:  Vitals:   04/14/22 2000 04/14/22 2050  BP: 113/74 98/64  Pulse: 69 67  Resp: 13   Temp:  37.1 C  SpO2: 92% 95%    Last Pain:  Vitals:   04/14/22 2050  TempSrc: Oral  PainSc:                  [place]

## 2022-04-14 NOTE — Anesthesia Procedure Notes (Signed)
Arterial Line Insertion Start/End2/19/2024 3:00 PM, 04/14/2022 3:10 PM Performed by: Moshe Salisbury, CRNA, CRNA  Patient location: Pre-op. Preanesthetic checklist: patient identified, IV checked, site marked, risks and benefits discussed, surgical consent, monitors and equipment checked, pre-op evaluation, timeout performed and anesthesia consent Lidocaine 1% used for infiltration Left, radial was placed Catheter size: 20 G Hand hygiene performed , maximum sterile barriers used  and Seldinger technique used  Attempts: 1 Procedure performed without using ultrasound guided technique. Following insertion, dressing applied and Biopatch. Post procedure assessment: normal  Patient tolerated the procedure well with no immediate complications.

## 2022-04-14 NOTE — Progress Notes (Signed)
Patient complaining of itching all over her body after using CHG wipes. Per Dr. Marcie Bal verbal order patient received Benadryl 12.5 mg iv. Will continue to monitor.

## 2022-04-14 NOTE — Progress Notes (Signed)
  Echocardiogram Echocardiogram Transesophageal has been performed.  Catherine Mays 04/14/2022, 5:30 PM

## 2022-04-14 NOTE — Interval H&P Note (Signed)
History and Physical Interval Note:  04/14/2022 2:02 PM  Catherine Mays  has presented today for surgery, with the diagnosis of lead failure.  The various methods of treatment have been discussed with the patient and family. After consideration of risks, benefits and other options for treatment, the patient has consented to  Procedure(s): LEAD EXTRACTION (N/A) ICD [place] (N/A) as a surgical intervention.  The patient's history has been reviewed, patient examined, no change in status, stable for surgery.  I have reviewed the patient's chart and labs.  Questions were answered to the patient's satisfaction.     Catherine Mays

## 2022-04-15 ENCOUNTER — Ambulatory Visit (HOSPITAL_COMMUNITY): Payer: BC Managed Care – PPO

## 2022-04-15 ENCOUNTER — Encounter (HOSPITAL_COMMUNITY): Payer: Self-pay | Admitting: Cardiology

## 2022-04-15 DIAGNOSIS — Z95810 Presence of automatic (implantable) cardiac defibrillator: Secondary | ICD-10-CM | POA: Diagnosis not present

## 2022-04-15 DIAGNOSIS — T82110A Breakdown (mechanical) of cardiac electrode, initial encounter: Secondary | ICD-10-CM | POA: Diagnosis not present

## 2022-04-15 MED ORDER — DIPHENHYDRAMINE HCL 25 MG PO CAPS
25.0000 mg | ORAL_CAPSULE | Freq: Four times a day (QID) | ORAL | Status: DC | PRN
  Administered 2022-04-15: 25 mg via ORAL
  Filled 2022-04-15: qty 1

## 2022-04-15 NOTE — Progress Notes (Signed)
Orthopedic Tech Progress Note Patient Details:  Catherine Mays 1966-02-08 [id number]  Patient ID: Catherine Mays, female   DOB: 06/26/1965, 57 y.o.   MRN: [id number] Patient has the arm sling according to rn. Catherine Mays 04/15/2022, 2:45 AM

## 2022-04-15 NOTE — Discharge Instructions (Signed)
Post procedure care instructions (GROIN site) Keep procedure site clean & dry. If you notice increased pain, swelling, bleeding or pus, call/return!  Y      Supplemental Discharge Instructions for  Pacemaker/Defibrillator Patients    Activity No heavy lifting or vigorous activity with your left/right arm for 6 to 8 weeks.  Do not raise your left/right arm above your head for one week.  Gradually raise your affected arm as drawn below.             04/19/22                    04/20/22                    04/21/22                  04/22/22 __  NO DRIVING for  1 week   ; you may begin driving on  [id number]   .  WOUND CARE Keep the wound area clean and dry.  Do not get this area wet , no showers for one week; you may shower on  04/22/22   . The tape/steri-strips on your wound will fall off; do not pull them off.  No bandage is needed on the site.  DO  NOT apply any creams, oils, or ointments to the wound area. If you notice any drainage or discharge from the wound, any swelling or bruising at the site, or you develop a fever > 101? F after you are discharged home, call the office at once.  Special Instructions You are still able to use cellular telephones; use the ear opposite the side where you have your pacemaker/defibrillator.  Avoid carrying your cellular phone near your device. When traveling through airports, show security personnel your identification card to avoid being screened in the metal detectors.  Ask the security personnel to use the hand wand. Avoid arc welding equipment, MRI testing (magnetic resonance imaging), TENS units (transcutaneous nerve stimulators).  Call the office for questions about other devices. Avoid electrical appliances that are in poor condition or are not properly grounded. Microwave ovens are safe to be near or to operate.  Additional information for defibrillator patients should your device go off: If your device goes off ONCE and you feel fine afterward,  notify the device clinic nurses. If your device goes off ONCE and you do not feel well afterward, call 911. If your device goes off TWICE, call 911. If your device goes off THREE times in one day, call 911.  DO NOT DRIVE YOURSELF OR A FAMILY MEMBER WITH A DEFIBRILLATOR TO THE HOSPITAL--CALL 911.

## 2022-04-15 NOTE — Progress Notes (Signed)
Assumed pt care at 0700. Aox4, tele monitored, no complaints of CP/SOB. Groin site intact, +CSM, no hematoma. PPM L chest site intact, restrictions known. 2 view and interrogation complete. D/c education and documentation to be complete by d/c RN. Full assessment per flowsheets, Meds given per [facility]. Safety maintained, call bell in reach, all needs made know.

## 2022-04-15 NOTE — Plan of Care (Signed)
  Problem: Education: Goal: Knowledge of cardiac device and self-care will improve Outcome: Not Progressing Goal: Ability to safely manage health related needs after discharge will improve Outcome: Not Progressing Goal: Individualized Educational Video(s) Outcome: Not Progressing   Problem: Cardiac: Goal: Ability to achieve and maintain adequate cardiopulmonary perfusion will improve Outcome: Not Progressing   Problem: Education: Goal: Knowledge of General Education information will improve Description: Including pain rating scale, medication(s)/side effects and non-pharmacologic comfort measures Outcome: Not Progressing   Problem: Health Behavior/Discharge Planning: Goal: Ability to manage health-related needs will improve Outcome: Not Progressing   Problem: Clinical Measurements: Goal: Ability to maintain clinical measurements within normal limits will improve Outcome: Not Progressing Goal: Will remain free from infection Outcome: Not Progressing Goal: Diagnostic test results will improve Outcome: Not Progressing Goal: Respiratory complications will improve Outcome: Not Progressing Goal: Cardiovascular complication will be avoided Outcome: Not Progressing   Problem: Activity: Goal: Risk for activity intolerance will decrease Outcome: Not Progressing   Problem: Nutrition: Goal: Adequate nutrition will be maintained Outcome: Not Progressing   Problem: Coping: Goal: Level of anxiety will decrease Outcome: Not Progressing   Problem: Elimination: Goal: Will not experience complications related to bowel motility Outcome: Not Progressing Goal: Will not experience complications related to urinary retention Outcome: Not Progressing   Problem: Pain Managment: Goal: General experience of comfort will improve Outcome: Not Progressing   Problem: Safety: Goal: Ability to remain free from injury will improve Outcome: Not Progressing   Problem: Skin Integrity: Goal: Risk  for impaired skin integrity will decrease Outcome: Not Progressing

## 2022-04-15 NOTE — Discharge Summary (Signed)
ELECTROPHYSIOLOGY PROCEDURE DISCHARGE SUMMARY    Patient ID: Catherine Mays,  MRN: [id number], DOB/AGE: June 07, 1965 57 y.o.  Admit date: 04/14/2022 Discharge date: 04/15/2022  Primary Care Physician: Celene Squibb, MD  Primary Cardiologist: Dr. Angelena Form AHF: Dr. Aundra Dubin Electrophysiologist: Dr. Curt Bears  Primary Discharge Diagnosis:  ICD battery depletion 2. ICD lead failure  Secondary Discharge Diagnosis:  CAD Remte STEMI ICM Chronic CHF (systolic) Currently compensated PVCs  Hx of ablation Paroxysmal AFib CHA2DS2Vasc is 3, on Eliquis  Allergies  Allergen Reactions   Paxil [Paroxetine] Palpitations   Chlorhexidine Gluconate Itching   Morphine And Related Nausea And Vomiting   Percocet [Oxycodone-Acetaminophen] Nausea And Vomiting   Cefdinir Nausea Only   Zolpidem Other (See Comments)    Doesn't work for patient at all     Procedures This Admission:  04/14/22: ICD lead extraction with new lead implant and generator change CONCLUSIONS:   1. Chronic systolic heart failure due to ischemic cardiomyopathy  2. ICD lead malfunction  3. Successful ICD lead extraction and replacement with ICD generator replacement  4.  No early apparent complications.   5. Hold anticoagulation for 5 days (OK to restart with 04/20/2022 AM dose) CXR on 04/15/22 demonstrated no pneumothorax status post device implantation.   Brief HPI: Catherine Mays is a 57 y.o. female is followed out patient, noting a steady increase in RV lead thresholds and impedance, and battery depletion, recommended lead extraction with new implant as well and generator change. Risks, benefits, and alternatives to ICD implantation were reviewed with the patient who wished to proceed.   Hospital Course:  The patient was admitted and underwent the planned procedure with details as outlined in her procedure report.  She was monitored on telemetry overnight which demonstrated SR/SB 50's-60's.  Left chest was without  hematoma or ecchymosis, groin site also stable.  The device was interrogated and found to be functioning normally.  CXR was obtained and demonstrated no pneumothorax status post device implantation.  Wound care, arm mobility, and restrictions were reviewed with the patient.  The patient  feels well, she denies any CP or SOB,  minimal implant site discomfort, she was examined by Dr. Quentin Ore and considered stable for discharge to home.   The patient's discharge medications include an ACE/ARB (Entresto) and beta blocker (sotalol).   She developed sporadic, perhaps diffuse itching,  no overt rash that benadryl helped, unclear what from, included scalp itching  Resume Eliquis 04/20/22  Physical Exam: Vitals:   04/14/22 2000 04/14/22 2050 04/15/22 0029 04/15/22 0710  BP: 113/74 98/64 99/68 $ 93/64  Pulse: 69 67 (!) 57 (!) 53  Resp: 13   18  Temp:  98.7 F (37.1 C) 98.7 F (37.1 C) 98.1 F (36.7 C)  TempSrc:  Oral Oral Oral  SpO2: 92% 95% 94% 97%  Weight:  73.9 kg 73.9 kg   Height:        GEN- The patient is well appearing, alert and oriented x 3 today.   HEENT: normocephalic, atraumatic; sclera clear, conjunctiva pink; hearing intact; oropharynx clear Lungs-  CTA b/l, normal work of breathing.  No wheezes, rales, rhonchi Heart-  RRR, no murmurs, rubs or gallops, PMI not laterally displaced GI- soft, non-tender, non-distended Extremities- no clubbing, cyanosis, or edema, groin site is stable, no hematoma, bleeding or tenderness MS- no significant deformity or atrophy Skin- warm and dry, no rash or lesion, left  chest without hematoma/ecchymosis Psych- euthymic mood, full affect Neuro- no gross defecits  Labs:  Lab Results  Component Value Date   WBC 5.2 03/21/2022   HGB 12.4 03/21/2022   HCT 36.0 03/21/2022   MCV 91 03/21/2022   PLT 175 03/21/2022   No results for input(s): "NA", "K", "CL", "CO2", "BUN", "CREATININE", "CALCIUM", "PROT", "BILITOT", "ALKPHOS", "ALT", "AST", "GLUCOSE"  in the last 168 hours.  Invalid input(s): "LABALBU"  Discharge Medications:  Allergies as of 04/15/2022       Reactions   Paxil [paroxetine] Palpitations   Chlorhexidine Gluconate Itching   Morphine And Related Nausea And Vomiting   Percocet [oxycodone-acetaminophen] Nausea And Vomiting   Cefdinir Nausea Only   Zolpidem Other (See Comments)   Doesn't work for patient at all        Medication List     TAKE these medications    acetaminophen 500 MG tablet Commonly known as: TYLENOL Take 1,000 mg by mouth every 6 (six) hours as needed for mild pain.   buPROPion 300 MG 24 hr tablet Commonly known as: WELLBUTRIN XL Take 150 mg by mouth daily.   CALCIUM + D3 PO Take 1 tablet by mouth every morning.   CoQ-10 200 MG Caps Take 200 mg by mouth daily.   dexlansoprazole 60 MG capsule Commonly known as: DEXILANT TAKE 1 CAPSULE(60 MG) BY MOUTH DAILY   Eliquis 5 MG Tabs tablet Generic drug: apixaban TAKE 1 TABLET(5 MG) BY MOUTH TWICE DAILY Notes to patient: Do not resume until 04/20/22 morning dose   Entresto 24-26 MG Generic drug: sacubitril-valsartan TAKE 1 TABLET BY MOUTH TWICE DAILY   escitalopram 10 MG tablet Commonly known as: LEXAPRO Take 1 tablet by mouth daily.   ferrous sulfate 325 (65 FE) MG EC tablet Take 325 mg by mouth daily.   fexofenadine 180 MG tablet Commonly known as: ALLEGRA Take 180 mg by mouth daily.   furosemide 20 MG tablet Commonly known as: LASIX TAKE 2 TABLETS(40 MG) BY MOUTH DAILY   nitroGLYCERIN 0.4 MG SL tablet Commonly known as: NITROSTAT PLACE 1 TABLET UNDER THE TONGUE EVERY 5 MINUTES AS NEEDED FOR CHEST PAIN   rosuvastatin 10 MG tablet Commonly known as: CRESTOR TAKE 1 TABLET(10 MG) BY MOUTH DAILY   sotalol 160 MG tablet Commonly known as: BETAPACE TAKE 1 TABLET BY MOUTH EVERY MORNING AND 1/2 TABLET EVERY EVENING   spironolactone 25 MG tablet Commonly known as: ALDACTONE Take 1 tablet (25 mg total) by mouth daily.    thyroid 120 MG tablet Commonly known as: ARMOUR Take 120 mg by mouth daily before breakfast.   traZODone 50 MG tablet Commonly known as: DESYREL Take 0.5 tablets (25 mg total) by mouth at bedtime as needed for sleep.   triamcinolone 55 MCG/ACT Aero nasal inhaler Commonly known as: NASACORT Place 2 sprays into the nose daily.        Disposition: Home Discharge Instructions     Diet - low sodium heart healthy   Complete by: As directed    Increase activity slowly   Complete by: As directed         Duration of Discharge Encounter: Greater than 30 minutes including physician time.  Venetia Night, PA-C 04/15/2022 11:01 AM

## 2022-04-15 NOTE — Progress Notes (Signed)
Remote ICD transmission.   

## 2022-04-15 NOTE — TOC Initial Note (Signed)
Transition of Care [facility]) - Initial/Assessment Note    Patient Details  Name: Catherine Mays MRN: [id number] Date of Birth: 07-23-1965  [facility] CM/SW Contact:    Erenest Rasher, RN Phone Number: [phone] 04/15/2022, 11:24 AM  Clinical Narrative:                 Spoke to pt and husband at bedside. Pt states she is retired. She has scale at home. Educated on importance of daily weights. No dc needs identified.   Expected Discharge Plan: Home/Self Care Barriers to Discharge: No Barriers Identified   Patient Goals and CMS Choice Patient states their goals for this hospitalization and ongoing recovery are:: wants to stay independent    Expected Discharge Plan and Services   Discharge Planning Services: CM Consult  Expected Discharge Date: 04/15/22                   Prior Living Arrangements/Services     Patient language and need for interpreter reviewed:: Yes Do you feel safe going back to the place where you live?: Yes      Need for Family Participation in Patient Care: No (Comment) Care giver support system in place?: Yes (comment)   Criminal Activity/Legal Involvement Pertinent to Current Situation/Hospitalization: No - Comment as needed  Activities of Daily Living Home Assistive Devices/Equipment: Eyeglasses, Scales ADL Screening (condition at time of admission) Patient's cognitive ability adequate to safely complete daily activities?: Yes Is the patient deaf or have difficulty hearing?: No Does the patient have difficulty seeing, even when wearing glasses/contacts?: No Does the patient have difficulty concentrating, remembering, or making decisions?: No Patient able to express need for assistance with ADLs?: Yes Does the patient have difficulty dressing or bathing?: No Independently performs ADLs?: Yes (appropriate for developmental age) Does the patient have difficulty walking or climbing stairs?: No Weakness of Legs: None Weakness of  Arms/Hands: None  Permission Sought/Granted Permission sought to share information with : Case Manager, Family Supports, PCP Permission granted to share information with : Yes, Verbal Permission Granted  Share Information with NAME: Dlynn Hataway     Permission granted to share info w Relationship: husband  Permission granted to share info w Contact Information: [phone]  Emotional Assessment Appearance:: Appears stated age Attitude/Demeanor/Rapport: Engaged Affect (typically observed): Accepting Orientation: : Oriented to Self, Oriented to Place, Oriented to  Time, Oriented to Situation   Psych Involvement: No (comment)  Admission diagnosis:  ICD (implantable cardioverter-defibrillator) in place [Z95.810] Patient Active Problem List   Diagnosis Date Noted   ICD (implantable cardioverter-defibrillator) in place 04/14/2022   Acute upper GI bleed 11/29/2020   Acute on chronic blood loss anemia 11/29/2020   GI bleed 11/29/2020   IDA (iron deficiency anemia) 05/23/2019   GERD (gastroesophageal reflux disease) 05/23/2019   Nausea without vomiting 05/23/2019   Weakness 12/18/2017   Sensation of chest pressure 12/17/2017   Encounter for screening colonoscopy 10/19/2017   PVC (premature ventricular contraction) 04/02/2017   Mitral regurgitation 04/17/2016   CHF (congestive heart failure) ([place]) 01/23/2016   PVC's (premature ventricular contractions)    Pre-diabetes    Chronic systolic CHF (congestive heart failure) ([place])    Ischemic cardiomyopathy    Hypotension    CAD in native artery    Hypothyroidism 09/24/2015   PAF (paroxysmal atrial fibrillation) ([place]) 09/24/2015   PCP:  Celene Squibb, MD Pharmacy:   [place], [place] AT [facility]  [address] [place] [address] Phone: [phone] Fax: [phone]     Social Determinants of Health (SDOH) Social History: SDOH Screenings   Food Insecurity: No Food  Insecurity (02/10/2020)  Housing: Low Risk  (02/10/2020)  Transportation Needs: No Transportation Needs (02/10/2020)  Alcohol Screen: Low Risk  (02/10/2020)  Depression (PHQ2-9): Low Risk  (02/10/2020)  Financial Resource Strain: Low Risk  (02/10/2020)  Physical Activity: Insufficiently Active (02/10/2020)  Social Connections: Socially Integrated (02/10/2020)  Stress: No Stress Concern Present (02/10/2020)  Tobacco Use: Low Risk  (04/15/2022)   SDOH Interventions:     Readmission Risk Interventions     No data to display

## 2022-04-15 NOTE — Progress Notes (Signed)
   Notified by RN that patient was feeling itchy this AM. RN informed me that patient was very itchy yesterday after her procedure, and it is suspected that she is having a mild reaction to the chlorhexidine wipes used. Yesterday, she received IV benadryl with improvement in symptoms. Today, itching is more mild. I ordered PRN benadryl PO for itching.   Margie Billet, PA-C 04/15/2022 6:30 AM

## 2022-04-18 LAB — BPAM RBC
Blood Product Expiration Date: 202403082359
Blood Product Expiration Date: 202403082359
Unit Type and Rh: 600
Unit Type and Rh: 600

## 2022-04-18 LAB — TYPE AND SCREEN
ABO/RH(D): A NEG
Antibody Screen: NEGATIVE
Unit division: 0
Unit division: 0

## 2022-04-25 ENCOUNTER — Ambulatory Visit: Payer: BC Managed Care – PPO | Attending: Cardiovascular Disease

## 2022-04-25 DIAGNOSIS — I255 Ischemic cardiomyopathy: Secondary | ICD-10-CM

## 2022-04-25 NOTE — Patient Instructions (Signed)
   After Your ICD (Implantable Cardiac Defibrillator)    Monitor your defibrillator site for redness, swelling, and drainage. Call the device clinic at [phone] if you experience these symptoms or fever/chills.  Your incision was closed with Steri-strips or staples:  You may shower 7 days after your procedure and wash your incision with soap and water. Avoid lotions, ointments, or perfumes over your incision until it is well-healed.  You may use a hot tub or a pool after your wound check appointment if the incision is completely closed.  Do not lift, push or pull greater than 10 pounds with the affected arm until 6 weeks after your procedure. UNTIL AFTER APRIL 1. There are no other restrictions in arm movement after your wound check appointment.   Your ICD is designed to protect you from life threatening heart rhythms. Because of this, you may receive a shock.   1 shock with no symptoms:  Call the office during business hours. 1 shock with symptoms (chest pain, chest pressure, dizziness, lightheadedness, shortness of breath, overall feeling unwell):  Call 911. If you experience 2 or more shocks in 24 hours:  Call 911. If you receive a shock, you should not drive.  [place] DMV - no driving for 6 months if you receive appropriate therapy from your ICD.   ICD Alerts:  Some alerts are vibratory and others beep. These are NOT emergencies. Please call our office to let us know. If this occurs at night or on weekends, it can wait until the next business day. Send a remote transmission.  If your device is capable of reading fluid status (for heart failure), you will be offered monthly monitoring to review this with you.   Remote monitoring is used to monitor your ICD from home. This monitoring is scheduled every 91 days by our office. It allows [place] to keep an eye on the functioning of your device to ensure it is working properly. You will routinely see your Electrophysiologist annually (more often  if necessary).

## 2022-04-27 LAB — CUP PACEART INCLINIC DEVICE CHECK
Battery Remaining Longevity: 118 mo
Brady Statistic RV Percent Paced: 0.03 %
Date Time Interrogation Session: 20240301130000
HighPow Impedance: 65.25 Ohm
Implantable Lead Connection Status: 753985
Implantable Lead Implant Date: 20240219
Implantable Lead Location: 753860
Implantable Pulse Generator Implant Date: 20240219
Lead Channel Impedance Value: 437.5 Ohm
Lead Channel Pacing Threshold Amplitude: 0.75 V
Lead Channel Pacing Threshold Amplitude: 1.25 V
Lead Channel Pacing Threshold Amplitude: 1.25 V
Lead Channel Pacing Threshold Pulse Width: 0.5 ms
Lead Channel Pacing Threshold Pulse Width: 0.5 ms
Lead Channel Pacing Threshold Pulse Width: 0.5 ms
Lead Channel Sensing Intrinsic Amplitude: 11.7 mV
Lead Channel Setting Pacing Amplitude: 3.5 V
Lead Channel Setting Pacing Pulse Width: 0.5 ms
Lead Channel Setting Sensing Sensitivity: 0.5 mV
Pulse Gen Serial Number: 810054849
Zone Setting Status: 755011

## 2022-04-27 NOTE — Progress Notes (Signed)
Wound check appointment. Steri-strips removed. Wound without redness or edema. Incision edges approximated, wound well healed. Normal device function. Thresholds, sensing, and impedances consistent with implant measurements. Device programmed at 3.5V for extra safety margin until 3 month visit. Histogram distribution appropriate for patient and level of activity. No mode switches or ventricular arrhythmias noted. Patient educated about wound care, arm mobility, lifting restrictions, shock plan. ROV in 3 months with implanting physician. NOTE:  PATIENT IS VERY SYMPTOMATIC WITH PACING THRESHOLD TESTING: Patient has accelerated V rates with pacing threshold test. She is very sensitive to loss of AV synchrony. Performed test with Gaspar Bidding from [facility] assistance and had to do a reverse threshold test to obtain loss of capture. An alert note has been placed in [place] for future checks.

## 2022-05-09 ENCOUNTER — Other Ambulatory Visit: Payer: Self-pay | Admitting: Internal Medicine

## 2022-05-10 ENCOUNTER — Other Ambulatory Visit: Payer: Self-pay | Admitting: Gastroenterology

## 2022-05-18 ENCOUNTER — Other Ambulatory Visit (HOSPITAL_COMMUNITY): Payer: Self-pay | Admitting: Cardiology

## 2022-06-09 ENCOUNTER — Other Ambulatory Visit (HOSPITAL_COMMUNITY): Payer: Self-pay

## 2022-06-09 ENCOUNTER — Other Ambulatory Visit (HOSPITAL_COMMUNITY): Payer: Self-pay | Admitting: Cardiology

## 2022-06-09 MED ORDER — FUROSEMIDE 20 MG PO TABS: ORAL_TABLET | ORAL | 3 refills | Status: DC

## 2022-06-17 ENCOUNTER — Ambulatory Visit: Payer: BC Managed Care – PPO

## 2022-07-22 ENCOUNTER — Other Ambulatory Visit (HOSPITAL_COMMUNITY): Payer: Self-pay

## 2022-07-22 MED ORDER — SOTALOL HCL 160 MG PO TABS: ORAL_TABLET | ORAL | 3 refills | Status: DC

## 2022-07-24 ENCOUNTER — Ambulatory Visit (INDEPENDENT_AMBULATORY_CARE_PROVIDER_SITE_OTHER): Payer: BC Managed Care – PPO

## 2022-07-24 ENCOUNTER — Encounter: Payer: BC Managed Care – PPO | Admitting: Cardiology

## 2022-07-24 DIAGNOSIS — I255 Ischemic cardiomyopathy: Secondary | ICD-10-CM

## 2022-07-24 LAB — CUP PACEART REMOTE DEVICE CHECK
Battery Remaining Longevity: 106 mo
Battery Remaining Percentage: 91 %
Battery Voltage: 3.02 V
Brady Statistic RV Percent Paced: 1 %
Date Time Interrogation Session: 20240530112021
HighPow Impedance: 73 Ohm
Implantable Lead Connection Status: 753985
Implantable Lead Implant Date: 20240219
Implantable Lead Location: 753860
Implantable Pulse Generator Implant Date: 20240219
Lead Channel Impedance Value: 430 Ohm
Lead Channel Pacing Threshold Amplitude: 1.25 V
Lead Channel Pacing Threshold Pulse Width: 0.5 ms
Lead Channel Sensing Intrinsic Amplitude: 11.7 mV
Lead Channel Setting Pacing Amplitude: 3.5 V
Lead Channel Setting Pacing Pulse Width: 0.5 ms
Lead Channel Setting Sensing Sensitivity: 0.5 mV
Pulse Gen Serial Number: 810054849
Zone Setting Status: 755011

## 2022-08-12 NOTE — Progress Notes (Signed)
Remote ICD transmission.   

## 2022-08-19 ENCOUNTER — Ambulatory Visit (HOSPITAL_COMMUNITY)
Admission: RE | Admit: 2022-08-19 | Discharge: 2022-08-19 | Disposition: A | Discharge status: Home / Self Care | Payer: BC Managed Care – PPO | Source: Ambulatory Visit | Attending: Cardiology | Admitting: Cardiology

## 2022-08-19 ENCOUNTER — Encounter (HOSPITAL_COMMUNITY): Payer: Self-pay | Admitting: Cardiology

## 2022-08-19 VITALS — BP 90/58 | HR 60 | Wt 154.4 lb

## 2022-08-19 DIAGNOSIS — I480 Paroxysmal atrial fibrillation: Secondary | ICD-10-CM | POA: Diagnosis not present

## 2022-08-19 DIAGNOSIS — I340 Nonrheumatic mitral (valve) insufficiency: Secondary | ICD-10-CM | POA: Insufficient documentation

## 2022-08-19 DIAGNOSIS — I252 Old myocardial infarction: Secondary | ICD-10-CM | POA: Insufficient documentation

## 2022-08-19 DIAGNOSIS — Z8249 Family history of ischemic heart disease and other diseases of the circulatory system: Secondary | ICD-10-CM | POA: Diagnosis not present

## 2022-08-19 DIAGNOSIS — I5022 Chronic systolic (congestive) heart failure: Secondary | ICD-10-CM | POA: Insufficient documentation

## 2022-08-19 DIAGNOSIS — Z7901 Long term (current) use of anticoagulants: Secondary | ICD-10-CM | POA: Insufficient documentation

## 2022-08-19 DIAGNOSIS — I493 Ventricular premature depolarization: Secondary | ICD-10-CM | POA: Insufficient documentation

## 2022-08-19 DIAGNOSIS — D500 Iron deficiency anemia secondary to blood loss (chronic): Secondary | ICD-10-CM | POA: Insufficient documentation

## 2022-08-19 DIAGNOSIS — Z955 Presence of coronary angioplasty implant and graft: Secondary | ICD-10-CM | POA: Insufficient documentation

## 2022-08-19 DIAGNOSIS — Z95810 Presence of automatic (implantable) cardiac defibrillator: Secondary | ICD-10-CM | POA: Insufficient documentation

## 2022-08-19 DIAGNOSIS — I255 Ischemic cardiomyopathy: Secondary | ICD-10-CM | POA: Diagnosis not present

## 2022-08-19 DIAGNOSIS — Z79899 Other long term (current) drug therapy: Secondary | ICD-10-CM | POA: Diagnosis not present

## 2022-08-19 DIAGNOSIS — E785 Hyperlipidemia, unspecified: Secondary | ICD-10-CM | POA: Insufficient documentation

## 2022-08-19 DIAGNOSIS — I2510 Atherosclerotic heart disease of native coronary artery without angina pectoris: Secondary | ICD-10-CM | POA: Diagnosis not present

## 2022-08-19 LAB — BASIC METABOLIC PANEL
Anion gap: 8 (ref 5–15)
BUN: 24 mg/dL — ABNORMAL HIGH (ref 6–20)
CO2: 26 mmol/L (ref 22–32)
Calcium: 8.8 mg/dL — ABNORMAL LOW (ref 8.9–10.3)
Chloride: 101 mmol/L (ref 98–111)
Creatinine, Ser: 1.21 mg/dL — ABNORMAL HIGH (ref 0.44–1.00)
GFR, Estimated: 53 mL/min — ABNORMAL LOW (ref >60)
Glucose, Bld: 103 mg/dL — ABNORMAL HIGH (ref 70–99)
Potassium: 4 mmol/L (ref 3.5–5.1)
Sodium: 135 mmol/L (ref 135–145)

## 2022-08-19 LAB — BRAIN NATRIURETIC PEPTIDE: B Natriuretic Peptide: 304.2 pg/mL — ABNORMAL HIGH (ref 0.0–100.0)

## 2022-08-19 MED ORDER — ROSUVASTATIN CALCIUM 20 MG PO TABS: ORAL_TABLET | ORAL | 3 refills | Status: DC

## 2022-08-19 NOTE — Progress Notes (Signed)
\   ADVANCED HF CLINIC NOTE  PCP: Dr. Margo Aye HF Cardiology: Dr. Shirlee Latch  [name] is a 57 y.o. with history of CAD s/p anterior STEMI with DES to LAD, ischemic cardiomyopathy, mitral regurgitation, and paroxysmal atrial fibrillation.  She was admitted in 7/17 with anterior STEMI.  She had a late presentation to the cath lab.  Echo in 10/17 showed EF about 25% with LAD-territory wall motion abnormalities.  She had peri-MI atrial fibrillation and was started on Xarelto.  She was put on amiodarone.   After discharge, she continued to feel poorly.  She developed nausea, anorexia, and significant fatigue.  She was admitted for evaluation in 10/17 given concern for low output heart failure.  However, stopping amiodarone essentially resolved her symptoms.  She had RHC showing preserved cardiac output but the PCWP was high due to prominent v-waves, presumably from significant mitral regurgitation.  Of note, she was in atrial fibrillation for a time during this admission.   She had St Jude ICD placed.  She is back at work for the [employer].   TEE in 8/18 to evaluate the mitral valve showed EF 30%, moderate MR (probably functional).    Patient was seen by Dr. Elberta Fortis and noted to have very frequent PVCs on device interrogation.  Holter in 12/18 showed 41% PVCs.  She was started on sotalol 80 mg bid and titrated up to 120 mg bid.  Coreg was stopped with the addition of sotalol and losartan was decreased to 12.5 mg daily due to low BP.  She had PVC ablation in 2/19 but PVCs recurred. Sotalol was increased to 160 mg bid.   Echo 8/19 showed EF 30% with regional wall motion abnormalities, normal RV size and systolic function, moderate MR.   She was admitted in 10/19 with chest pain.  Cath showed patent LAD stent and no obstructive CAD.  She was found to be anemic with Fe deficiency (hgb down to 8).  FOBT+. She has had IV Fe and blood transfusion.  EGD and colonoscopy this year were fairly  unremarkable.  She had diverticulosis.  Capsule endoscopy negative in 9/21.   Echo in 1/22 showed EF 25-30%, wall motion abnormalities in LAD distribution, mild-moderate MR, normal RV.    Admitted 11/28/2020 with anemia, hemoglobin of 8.6 (baseline around 10-11). She was transfused 1 unit of PRBC on 11/29/2020. GI was consulted and she underwent EGD on 11/30/2020 which showed no source of bleeding.  Capsule endoscopy on 12/01/2020 showed blood in the mid/distal small bowel and proximal colon.  Colonoscopy on 12/02/2020 showed blood in the terminal ileum but no clear source.  It is suspected that the source of bleeding is in the distal small bowel that is not able to be reached via routine colonoscope.  GI asked IR to consider an arteriogram to localize and treat the source of her bleeding. CT done and felt best option was DBE with Dr. Theodore Demark at [place]. Double balloon endoscopy was done in 12/22, no definite cause of GI bleeding was found.   Echo in 3/23 showed EF 25-30%, apical akinesis, mild MR, normal RV, normal IVC.   She returns for followup of CHF.  BP remains low but she denies lightheadedness unless she stands too fast or bends over for a long time then stands up.  Weight is down 9 lbs.  No chest pain.  She is short of breath and fatigued walking up stairs but does ok on flat ground.  No orthopnea/PND.  She walks  for exercise.   ECG (personally reviewed): NSR, LAFB, IVCD QRS 132 msec, QTc 430 msec  St Jude device interrogation: No VF/VT  Labs (10/17): K 3.9, creatinine 1.24, hgb 10.3 Labs (11/17): K 4, creatinine 1.27 Labs (12/17): K 4.2, creatinine 1.12, BNP 439 Labs (2/18): K 4.7, creatinine 1.37 Labs (7/18): K 3.9, creatinine 1.14, hgb 11.7 Labs (12/18): K 4.5, creatinine 1.23 Labs (1/19): K 4.4, creatinine 1.14 Labs (2/19): LDL 97 (off atorvastatin), HDL 56 Labs (5/19): K 4.3, creatinine 1.1 Labs (10/19): K 4.2, creatinine 1.2, LDL 70, hgb 11.8 Labs (11/19): K 4.9, creatinine 1.17, hgb  11.8, LDL 68, low TSH  Labs (9/20): K 4.4, creatinine 1.34, LDL 52 Labs (12/20): K 4.6, creatinine 1.19 Labs (6/21): LDL 73, HDL 62, hgb 11.9, K 4.2, creatinine 1.14 Labs (7/21): hgb 11.2, K 3.8, creatinine 1.12 Labs (10/21): K 4.5, creatinine 1.38, LDL 53, HDL 50 Labs (1/22): K 4.9, creatinine 0.99 Labs (2/22): hgb 11.6, Fe studies normal Labs (5/22): K 5, creatinine 1.05, hgb 11.8 Labs (10/22): K 3.9, creatinine 1.02, hgb 7.1 Labs (11/22): hgb 11.9, K 3.7, creatinine 1.02, BNP 347 Labs (3/23): hgb 13.1 Labs (4/23): K 4.1, creatinine 1.16, pro-BNP 2121 Labs (5/23): LDL 71, TGs 74, K 4.1, creatinine 0.98 Labs (8/23): LDL 62, NT-proBNP 2843, K 3.8, creatinine 1.02 Labs (3/24): LDL 79, TGs 102, K 4.5, creatinine 1.07  PMH:  1. CAD: Anterior STEMI in 7/17 with late presentation to the cath lab. She had DES to proximal LAD.  No significant disease in other vessels.  - LHC (10/19): Patent LAD stent, no obstructive disease.  2. Chronic systolic CHF: Ischemic cardiomyopathy.  - Echo 10/17 with EF 25%, akinesis of the mid-apical anteroseptal and anterior walls, apical dyskinesis, moderate to severe MR with incomplete coaptation.  - RHC (10/17): mean RA 4, PA 59/17 mean 38, mean PCWP 27 with prominent V waves suggesting significant MR, CI 3.06, PVR 2 WU.  - ACEI cough.  - Echo (11/17): EF 30-35%, normal RV size and systolic function, severe MR with restriction of posterior leaflet.   - Echo (8/19): EF 30% with regional wall motion abnormalities, normal RV size and systolic function, moderate MR.  - Echo (12/20): EF 30%, LAD wall motion abnormalities, normal RV, PASP 35, moderate functional MR.  - Echo (1/22): EF 25-30%, wall motion abnormalities in LAD distribution, mild-moderate MR, normal RV.  - Echo (3/23): EF 25-30%, apical akinesis, mild MR, normal RV, normal IVC.  3. Atrial fibrillation: Paroxysmal.  Noted 7/17 admission peri-MI, also noted again 10/17 admission. She did not tolerate  amiodarone due to nausea/anorexia.  4. Mitral regurgitation: Moderate to severe on 10/17 echo, incomplete coaptation.  ?Etiology, her MI (anterior) does not typically lead to ischemic MR.  - Echo in 11/17 with severe MR, posterior leaflet restricted.  - TEE (8/18): EF 30%, moderately dilated LV with akinetic septal and anterior walls, moderate functional MR, normal RV size and systolic function.  - Echo (8/19): Moderate MR - Echo (3/23): Mild MR.  5. Hyperlipidemia: Myalgias with atorvastatin.  6. PVCs: Holter 12/18 with 41% PVCs.  - PVC ablation in 2/19 with recurrence of PVCs.  7. GI bleeding:  - EGD (4/21): unremarkable.  - Colonoscopy (7/21): Diverticulosis.  - Capsule endoscopy (9/21): negative.  - Double balloon endoscopy at [facility] in 12/22 did not show cause for bleeding.   SH: Married, lives in [place], works for the [employer], no smoking or ETOH.   Family History  Problem Relation Age of Onset  Heart attack Father    Arrhythmia Mother    Lung cancer Mother        bronchiectasis   Colon cancer Neg Hx    Colon polyps Neg Hx    ROS: All systems reviewed and negative except as per HPI.   Current Outpatient Medications on File Prior to Encounter  Medication Sig Dispense Refill   acetaminophen (TYLENOL) 500 MG tablet Take 1,000 mg by mouth every 6 (six) hours as needed for mild pain.     buPROPion (WELLBUTRIN XL) 300 MG 24 hr tablet Take 150 mg by mouth daily.     Calcium Carb-Cholecalciferol (CALCIUM + D3 PO) Take 1 tablet by mouth every morning.     Coenzyme Q10 (COQ-10) 200 MG CAPS Take 200 mg by mouth daily.     dexlansoprazole (DEXILANT) 60 MG capsule TAKE 1 CAPSULE(60 MG) BY MOUTH DAILY 90 capsule 3   ELIQUIS 5 MG TABS tablet TAKE 1 TABLET(5 MG) BY MOUTH TWICE DAILY 180 tablet 3   ENTRESTO 24-26 MG TAKE 1 TABLET BY MOUTH TWICE DAILY 60 tablet 11   escitalopram (LEXAPRO) 10 MG tablet Take 1 tablet by mouth daily.     ferrous sulfate 325 (65 FE) MG EC tablet Take 325 mg by  mouth daily.     fexofenadine (ALLEGRA) 180 MG tablet Take 180 mg by mouth daily.     furosemide (LASIX) 20 MG tablet TAKE 2 TABLETS(40 MG) BY MOUTH DAILY 90 tablet 3   nitroGLYCERIN (NITROSTAT) 0.4 MG SL tablet PLACE 1 TABLET UNDER THE TONGUE EVERY 5 MINUTES AS NEEDED FOR CHEST PAIN 25 tablet 3   sotalol (BETAPACE) 160 MG tablet Take1 tablet by mouth every morning and 1/2 tablet every evening. 135 tablet 3   spironolactone (ALDACTONE) 25 MG tablet TAKE 1 TABLET(25 MG) BY MOUTH DAILY 30 tablet 11   thyroid (ARMOUR) 120 MG tablet Take 120 mg by mouth daily before breakfast.     traZODone (DESYREL) 50 MG tablet Take 0.5 tablets (25 mg total) by mouth at bedtime as needed for sleep. 30 tablet 2   triamcinolone (NASACORT) 55 MCG/ACT AERO nasal inhaler Place 2 sprays into the nose daily.      No current facility-administered medications on file prior to encounter.   Wt Readings from Last 3 Encounters:  08/19/22 70 kg (154 lb 6.4 oz)  04/15/22 73.9 kg (162 lb 14.7 oz)  04/04/22 73.9 kg (163 lb)   BP (!) 90/58   Pulse 60   Wt 70 kg (154 lb 6.4 oz)   SpO2 98%   BMI 22.80 kg/m  General: NAD Neck: No JVD, no thyromegaly or thyroid nodule.  Lungs: Clear to auscultation bilaterally with normal respiratory effort. CV: Nondisplaced PMI.  Heart regular S1/S2, no S3/S4, no murmur.  No peripheral edema.  No carotid bruit.  Normal pedal pulses.  Abdomen: Soft, nontender, no hepatosplenomegaly, no distention.  Skin: Intact without lesions or rashes.  Neurologic: Alert and oriented x 3.  Psych: Normal affect. Extremities: No clubbing or cyanosis.  HEENT: Normal.   Assessment/Plan: 1. CAD: S/p anterior MI in 7/17 with DES to RCA.  Cath in 10/19 with no obstructive CAD.  No chest pain.  - She is off Plavix now and taking apixaban 5 mg bid.   - She is tolerating Crestor without myalgias.       2. Chronic systolic CHF: Ischemic cardiomyopathy.  EF 25% on echo in 10/17, 30-35% on echo in 11/17, 30% on  TEE in 8/18.  She has extensive LAD-territory scar. 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  Echo in 3/23 showed that EF remained 25-30% with LAD-territory scar.  She is not volume overloaded on exam, still with NYHA class III symptoms with prominent fatigue.  Low BP has made adjustment of GDMT difficult. Not volume overloaded on exam.   - Continue Lasix 40 mg daily. BMET today.  - Off Coreg with low BP and addition of sotalol.   - Continue Entresto 24/26 bid, no BP room to increase.  - She did not tolerate dapagliflozin due to yeast infections.   - Continue spironolactone 25 mg daily.   - QRS does not appear to be wide enough that she would have benefit from CRT.  - Insurance did not approve her for baroreceptor activation therapy.   - I am going to have her evaluated for cardiac contractility modulation, has appointment on 09/25/22 with Dr. Lalla Brothers.    - I will arrange for repeat echo at followup in 3 months.  3. Mitral regurgitation: Moderate to severe on 10/17 echo, severe on 11/17 echo.  I did a TEE in 8/18.  This showed moderate MR that appeared to be functional.  No indication for surgery or Mitraclip. Echo in 3/23 showed mild MR.  4. Atrial fibrillation: Unable to tolerate amiodarone due to GI side effects.  She is in NSR today on Eliquis and sotalol. QTc interval is acceptable on sotalol on today's ECG.    5. PVCs: Very frequent on 12/18 holter (41% beats), she has had a PVC ablation in 2/19. Due to recurrence of PVCs post-ablation, she is now on sotalol. She does not seem to have many PVCs.  - Continue sotalol, QTc ok today.   6. Hyperlipidemia: Tolerating Crestor. Goal LDL < 55.  - Increase Crestor to 20 mg daily with lipids/LFTs in 2 months.   7. GI bleeding: With anemia.  She had episode in 10/22, has not had overt recurrence. She has been considered for Watchman.   - She wants to hold off on Watchman for now, will to consider with a close recurrence of GI bleeding.   Followup in 3 months with  echo  Marca Ancona 08/19/2022

## 2022-08-19 NOTE — Patient Instructions (Addendum)
Good to see you today  INCREASE Crestor to 20 mg daily  Labs done today, your results will be available in MyChart, we will contact you for abnormal readings.  Lab work in 2 months as scheduled  Your physician has requested that you have an echocardiogram. Echocardiography is a painless test that uses sound waves to create images of your heart. It provides your doctor with information about the size and shape of your heart and how well your heart's chambers and valves are working. This procedure takes approximately one hour. There are no restrictions for this procedure. Please do NOT wear cologne, perfume, aftershave, or lotions (deodorant is allowed). Please arrive 15 minutes prior to your appointment time.  Your physician recommends that you schedule a follow-up appointment in: 3 months with echocardiogram  If you have any questions or concerns before your next appointment please send [place] a message through [place] or call our office at [phone].    TO LEAVE A MESSAGE FOR THE NURSE SELECT OPTION 2, PLEASE LEAVE A MESSAGE INCLUDING: YOUR NAME DATE OF BIRTH CALL BACK NUMBER REASON FOR CALL**this is important as we prioritize the call backs  YOU WILL RECEIVE A CALL BACK THE SAME DAY AS LONG AS YOU CALL BEFORE 4:00 PM  At the [facility], you and your health needs are our priority. As part of our continuing mission to provide you with exceptional heart care, we have created designated Provider Care Teams. These Care Teams include your primary Cardiologist (physician) and Advanced Practice Providers (APPs- Physician Assistants and Nurse Practitioners) who all work together to provide you with the care you need, when you need it.   You may see any of the following providers on your designated Care Team at your next follow up: Dr Arvilla Meres Dr Marca Ancona Dr. Marcos Eke, NP Robbie Lis, [place] [facility] [place], [place] Brynda Peon,  NP Karle Plumber, PharmD   Please be sure to bring in all your medications bottles to every appointment.    Thank you for choosing [facility]

## 2022-08-26 ENCOUNTER — Telehealth (HOSPITAL_COMMUNITY): Payer: Self-pay

## 2022-08-26 MED ORDER — ROSUVASTATIN CALCIUM 20 MG PO TABS: ORAL_TABLET | ORAL | 3 refills | Status: DC

## 2022-08-26 NOTE — Telephone Encounter (Signed)
Patient's Rosuvastatin medication has been sent to her pharmacy with the appropriate refills.

## 2022-08-27 ENCOUNTER — Encounter: Payer: Self-pay | Admitting: Cardiology

## 2022-08-27 ENCOUNTER — Other Ambulatory Visit (HOSPITAL_COMMUNITY): Payer: Self-pay

## 2022-08-27 ENCOUNTER — Other Ambulatory Visit (HOSPITAL_COMMUNITY): Payer: Self-pay | Admitting: Cardiology

## 2022-08-27 MED ORDER — ROSUVASTATIN CALCIUM 20 MG PO TABS: 20.0000 mg | ORAL_TABLET | Freq: Every day | ORAL | 3 refills | Status: DC

## 2022-08-27 NOTE — Telephone Encounter (Signed)
Pharmacy called to request clarification for crestor. Reports multiple refills for 20 mg daily and 10 mg daily in SIG line  Correct dose of crestor 20 mg daily sent to pharmacy

## 2022-09-16 ENCOUNTER — Ambulatory Visit: Payer: BC Managed Care – PPO

## 2022-09-25 ENCOUNTER — Encounter: Payer: Self-pay | Admitting: Cardiology

## 2022-09-25 ENCOUNTER — Ambulatory Visit: Payer: BC Managed Care – PPO | Attending: Cardiology | Admitting: Cardiology

## 2022-09-25 VITALS — BP 96/66 | HR 59 | Ht 69.0 in | Wt 157.8 lb

## 2022-09-25 DIAGNOSIS — I5022 Chronic systolic (congestive) heart failure: Secondary | ICD-10-CM | POA: Diagnosis not present

## 2022-09-25 DIAGNOSIS — Z95810 Presence of automatic (implantable) cardiac defibrillator: Secondary | ICD-10-CM

## 2022-09-25 DIAGNOSIS — I493 Ventricular premature depolarization: Secondary | ICD-10-CM | POA: Diagnosis not present

## 2022-09-25 NOTE — Patient Instructions (Signed)
Medication Instructions:  Your physician recommends that you continue on your current medications as directed. Please refer to the Current Medication list given to you today.  *If you need a refill on your cardiac medications before your next appointment, please call your pharmacy*  Testing/Procedures: Your provider has recommended a Contract Contractility Modulation device. We will call you to schedule the procedure once we get approval from your insurance company.   Follow-Up: At [facility], you and your health needs are our priority.  As part of our continuing mission to provide you with exceptional heart care, we have created designated Provider Care Teams.  These Care Teams include your primary Cardiologist (physician) and Advanced Practice Providers (APPs -  Physician Assistants and Nurse Practitioners) who all work together to provide you with the care you need, when you need it.  Your next appointment:   We will call you to arrange follow up appointments.

## 2022-09-25 NOTE — Progress Notes (Signed)
Electrophysiology Office Follow up Visit Note:    Date:  09/25/2022   ID:  Catherine Mays, DOB 1965/08/30, MRN [id number]  PCP:  Benita Stabile, MD  [facility] Cardiologist:  Verne Carrow, MD  [facility] Electrophysiologist:  Will Jorja Loa, MD    Interval History:    Catherine Mays is a 57 y.o. female who presents for a follow up visit.   On April 14, 2022 I extracted a malfunctioning RV ICD lead and replaced this with a new wire.  She tolerated the procedure well. Remote interrogations following system revision of shown stable device function. She has done well since her RV ICD lead extraction and replacement.  Minimal pain in the shoulder after the surgery that is now completely resolved.    Past medical, surgical, social and family history were reviewed.  ROS:   Please see the history of present illness.    All other systems reviewed and are negative.  EKGs/Labs/Other Studies Reviewed:    The following studies were reviewed today:  September 25, 2022 in clinic device interrogation personally reviewed Battery longevity 9.6 years Lead parameter stable No high-voltage therapies delivered Ventricular pacing less than 1%  EKG Interpretation Date/Time:  Thursday September 25 2022 15:50:28 EDT Ventricular Rate:  59 PR Interval:  190 QRS Duration:  130 QT Interval:  420 QTC Calculation: 415 R Axis:   -73  Text Interpretation: Sinus bradycardia with sinus arrhythmia Left axis deviation Non-specific intra-ventricular conduction block Confirmed by Steffanie Dunn [phone]) on 09/25/2022 4:35:38 PM    Physical Exam:    VS:  BP 96/66   Pulse (!) 59   Ht 5\' 9"  (1.753 m)   Wt 157 lb 12.8 oz (71.6 kg)   SpO2 98%   BMI 23.30 kg/m     Wt Readings from Last 3 Encounters:  09/25/22 157 lb 12.8 oz (71.6 kg)  08/19/22 154 lb 6.4 oz (70 kg)  04/15/22 162 lb 14.7 oz (73.9 kg)     GEN:  Well nourished, well developed in no acute distress CARDIAC: RRR, no murmurs,  rubs, gallops.  Left prepectoral pocket well-healed RESPIRATORY:  Clear to auscultation without rales, wheezing or rhonchi       ASSESSMENT:    1. Chronic systolic CHF (congestive heart failure) (HCC)   2. Implantable cardioverter-defibrillator (ICD) in situ   3. PVC's (premature ventricular contractions)    PLAN:    In order of problems listed above:  #Chronic systolic heart failure NYHA class II.  Warm and dry on exam.  Continue spironolactone, Lasix, Entresto Had a long discussion with the patient today about alternative therapies for her heart failure including Verastem and cardiac contractility modulation.  We discussed the CCM implant procedure in detail including the recovery and risks.  She is interested in pursuing either CCM or barostim.  She is apparently close to getting approved for Medicare/disability which may impact the approval process for barostim therapy.  She has an appointment coming up soon with Dr. Shirlee Latch and will discuss the 2 options with him.  If she elects to proceed with CCM, we can get this scheduled.  She would need to hold her anticoagulant for 48 hours prior to the procedure with plans to restart it 5 days after.  #ICD in situ Device functioning appropriately now that I have revised the RV ICD lead.  Continue remote monitoring.  #PVCs Continue sotalol.  QTc today acceptable for ongoing sotalol use.   Total time of encounter: 52 minutes total time of  encounter, including face-to-face patient care, coordination of care and counseling regarding high complexity medical decision making re: CHF.    Signed, Steffanie Dunn, MD, [facility], [facility] 09/25/2022 4:35 PM    Electrophysiology [facility]

## 2022-10-10 ENCOUNTER — Other Ambulatory Visit (HOSPITAL_COMMUNITY): Payer: Self-pay | Admitting: Cardiology

## 2022-10-20 ENCOUNTER — Ambulatory Visit (HOSPITAL_COMMUNITY)
Admission: RE | Admit: 2022-10-20 | Discharge: 2022-10-20 | Disposition: A | Discharge status: Home / Self Care | Payer: BC Managed Care – PPO | Source: Ambulatory Visit | Attending: Cardiology | Admitting: Cardiology

## 2022-10-20 DIAGNOSIS — I5022 Chronic systolic (congestive) heart failure: Secondary | ICD-10-CM | POA: Diagnosis present

## 2022-10-20 LAB — LIPID PANEL
Cholesterol: 122 mg/dL (ref 0–200)
HDL: 41 mg/dL (ref >40)
LDL Cholesterol: 55 mg/dL (ref 0–99)
Total CHOL/HDL Ratio: 3 RATIO
Triglycerides: 130 mg/dL (ref <150)
VLDL: 26 mg/dL (ref 0–40)

## 2022-10-20 LAB — HEPATIC FUNCTION PANEL
ALT: 14 U/L (ref 0–44)
AST: 18 U/L (ref 15–41)
Albumin: 3.7 g/dL (ref 3.5–5.0)
Alkaline Phosphatase: 65 U/L (ref 38–126)
Bilirubin, Direct: <0.1 mg/dL (ref 0.0–0.2)
Total Bilirubin: 0.6 mg/dL (ref 0.3–1.2)
Total Protein: 6.7 g/dL (ref 6.5–8.1)

## 2022-10-23 ENCOUNTER — Ambulatory Visit (INDEPENDENT_AMBULATORY_CARE_PROVIDER_SITE_OTHER): Payer: BC Managed Care – PPO

## 2022-10-23 DIAGNOSIS — I255 Ischemic cardiomyopathy: Secondary | ICD-10-CM | POA: Diagnosis not present

## 2022-10-23 LAB — CUP PACEART REMOTE DEVICE CHECK
Battery Remaining Longevity: 112 mo
Battery Remaining Percentage: 89 %
Battery Voltage: 3.02 V
Brady Statistic RV Percent Paced: 1 %
Date Time Interrogation Session: 20240829020127
HighPow Impedance: 73 Ohm
Implantable Lead Connection Status: 753985
Implantable Lead Implant Date: 20240219
Implantable Lead Location: 753860
Implantable Pulse Generator Implant Date: 20240219
Lead Channel Impedance Value: 440 Ohm
Lead Channel Pacing Threshold Amplitude: 1.5 V
Lead Channel Pacing Threshold Pulse Width: 0.5 ms
Lead Channel Sensing Intrinsic Amplitude: 11.7 mV
Lead Channel Setting Pacing Amplitude: 2.5 V
Lead Channel Setting Pacing Pulse Width: 0.4 ms
Lead Channel Setting Sensing Sensitivity: 0.5 mV
Pulse Gen Serial Number: 810054849
Zone Setting Status: 755011

## 2022-10-27 ENCOUNTER — Other Ambulatory Visit (HOSPITAL_COMMUNITY): Payer: Self-pay | Admitting: Cardiology

## 2022-10-30 NOTE — Progress Notes (Signed)
Remote ICD transmission.   

## 2022-11-04 ENCOUNTER — Other Ambulatory Visit (HOSPITAL_COMMUNITY): Payer: Self-pay | Admitting: Cardiology

## 2022-11-18 ENCOUNTER — Telehealth: Payer: Self-pay | Admitting: *Deleted

## 2022-11-18 NOTE — Telephone Encounter (Signed)
Echo auth  Order ID: [id number]       Authorized  Approval Valid Through: 11/18/2022 - 12/17/2022

## 2022-11-20 ENCOUNTER — Ambulatory Visit (HOSPITAL_COMMUNITY)
Admission: RE | Admit: 2022-11-20 | Discharge: 2022-11-20 | Disposition: A | Discharge status: Home / Self Care | Payer: BC Managed Care – PPO | Source: Ambulatory Visit | Attending: Cardiology | Admitting: Cardiology

## 2022-11-20 ENCOUNTER — Encounter (HOSPITAL_COMMUNITY): Payer: Self-pay | Admitting: Cardiology

## 2022-11-20 ENCOUNTER — Ambulatory Visit (HOSPITAL_BASED_OUTPATIENT_CLINIC_OR_DEPARTMENT_OTHER)
Admission: RE | Admit: 2022-11-20 | Discharge: 2022-11-20 | Disposition: A | Discharge status: Home / Self Care | Payer: BC Managed Care – PPO | Source: Ambulatory Visit | Attending: Cardiology | Admitting: Cardiology

## 2022-11-20 VITALS — BP 90/58 | HR 57 | Wt 156.0 lb

## 2022-11-20 DIAGNOSIS — I5042 Chronic combined systolic (congestive) and diastolic (congestive) heart failure: Secondary | ICD-10-CM | POA: Insufficient documentation

## 2022-11-20 DIAGNOSIS — I5022 Chronic systolic (congestive) heart failure: Secondary | ICD-10-CM | POA: Diagnosis not present

## 2022-11-20 DIAGNOSIS — R0609 Other forms of dyspnea: Secondary | ICD-10-CM | POA: Insufficient documentation

## 2022-11-20 DIAGNOSIS — I340 Nonrheumatic mitral (valve) insufficiency: Secondary | ICD-10-CM | POA: Insufficient documentation

## 2022-11-20 DIAGNOSIS — I428 Other cardiomyopathies: Secondary | ICD-10-CM | POA: Insufficient documentation

## 2022-11-20 LAB — ECHOCARDIOGRAM COMPLETE
AR max vel: 1.55 cm2
AV Area VTI: 1.4 cm2
AV Area mean vel: 1.67 cm2
AV Mean grad: 4 mmHg
AV Peak grad: 7 mmHg
Ao pk vel: 1.32 m/s
Area-P 1/2: 2.82 cm2
S' Lateral: 4.8 cm

## 2022-11-20 LAB — BASIC METABOLIC PANEL
Anion gap: 6 (ref 5–15)
BUN: 20 mg/dL (ref 6–20)
CO2: 28 mmol/L (ref 22–32)
Calcium: 8.6 mg/dL — ABNORMAL LOW (ref 8.9–10.3)
Chloride: 102 mmol/L (ref 98–111)
Creatinine, Ser: 1.12 mg/dL — ABNORMAL HIGH (ref 0.44–1.00)
GFR, Estimated: 58 mL/min — ABNORMAL LOW (ref >60)
Glucose, Bld: 108 mg/dL — ABNORMAL HIGH (ref 70–99)
Potassium: 4.4 mmol/L (ref 3.5–5.1)
Sodium: 136 mmol/L (ref 135–145)

## 2022-11-20 LAB — BRAIN NATRIURETIC PEPTIDE: B Natriuretic Peptide: 318.5 pg/mL — ABNORMAL HIGH (ref 0.0–100.0)

## 2022-11-20 MED ORDER — PERFLUTREN LIPID MICROSPHERE
1.0000 mL | INTRAVENOUS | Status: DC | PRN
  Administered 2022-11-20: 3 mL via INTRAVENOUS
  Filled 2022-11-20: qty 10

## 2022-11-20 NOTE — Patient Instructions (Signed)
Medication Changes:  No Changes In Medications at this time.   Lab Work:  Labs done today, your results will be available in MyChart, we will contact you for abnormal readings.  Referrals:  YOU HAVE BEEN REFERRED TO ENDOCRINOLOGY THEY WILL REACH OUT TO YOU OR CALL TO ARRANGE THIS. PLEASE CALL us WITH ANY CONCERNS   YOU HAVE BEEN REFERRED TO DR. Lalla Brothers THEY WILL REACH OUT TO YOU OR CALL TO ARRANGE THIS. PLEASE CALL us WITH ANY CONCERNS   Follow-Up in: 3 MONS AS SCHEDULED WITH APP   At the [facility], you and your health needs are our priority. We have a designated team specialized in the treatment of Heart Failure. This Care Team includes your primary Heart Failure Specialized Cardiologist (physician), Advanced Practice Providers (APPs- Physician Assistants and Nurse Practitioners), and Pharmacist who all work together to provide you with the care you need, when you need it.   You may see any of the following providers on your designated Care Team at your next follow up:  Dr. Arvilla Meres Dr. Marca Ancona Dr. Marcos Eke, NP Robbie Lis, [place] [facility] [place], [place] Brynda Peon, NP Karle Plumber, PharmD   Please be sure to bring in all your medications bottles to every appointment.   Need to Contact us:  If you have any questions or concerns before your next appointment please send [place] a message through [place] or call our office at [phone].    TO LEAVE A MESSAGE FOR THE NURSE SELECT OPTION 2, PLEASE LEAVE A MESSAGE INCLUDING: YOUR NAME DATE OF BIRTH CALL BACK NUMBER REASON FOR CALL**this is important as we prioritize the call backs  YOU WILL RECEIVE A CALL BACK THE SAME DAY AS LONG AS YOU CALL BEFORE 4:00 PM

## 2022-11-20 NOTE — Addendum Note (Signed)
Encounter addended by: [name], MD on: 11/20/2022 9:55 PM  Actions taken: Level of Service modified

## 2022-11-20 NOTE — Progress Notes (Signed)
\   ADVANCED HF CLINIC NOTE  PCP: Dr. Margo Aye HF Cardiology: Dr. Shirlee Latch  Catherine Mays is a 57 y.o. with history of CAD s/p anterior STEMI with DES to LAD, ischemic cardiomyopathy, mitral regurgitation, and paroxysmal atrial fibrillation.  She was admitted in 7/17 with anterior STEMI.  She had a late presentation to the cath lab.  Echo in 10/17 showed EF about 25% with LAD-territory wall motion abnormalities.  She had peri-MI atrial fibrillation and was started on Xarelto.  She was put on amiodarone.   After discharge, she continued to feel poorly.  She developed nausea, anorexia, and significant fatigue.  She was admitted for evaluation in 10/17 given concern for low output heart failure.  However, stopping amiodarone essentially resolved her symptoms.  She had RHC showing preserved cardiac output but the PCWP was high due to prominent v-waves, presumably from significant mitral regurgitation.  Of note, she was in atrial fibrillation for a time during this admission.   She had St Jude ICD placed.  She is back at work for the [employer]   TEE in 8/18 to evaluate the mitral valve showed EF 30%, moderate MR (probably functional).    Patient was seen by Dr. Elberta Fortis and noted to have very frequent PVCs on device interrogation.  Holter in 12/18 showed 41% PVCs.  She was started on sotalol 80 mg bid and titrated up to 120 mg bid.  Coreg was stopped with the addition of sotalol and losartan was decreased to 12.5 mg daily due to low BP.  She had PVC ablation in 2/19 but PVCs recurred. Sotalol was increased to 160 mg bid.   Echo 8/19 showed EF 30% with regional wall motion abnormalities, normal RV size and systolic function, moderate MR.   She was admitted in 10/19 with chest pain.  Cath showed patent LAD stent and no obstructive CAD.  She was found to be anemic with Fe deficiency (hgb down to 8).  FOBT+. She has had IV Fe and blood transfusion.  EGD and colonoscopy this year were fairly  unremarkable.  She had diverticulosis.  Capsule endoscopy negative in 9/21.   Echo in 1/22 showed EF 25-30%, wall motion abnormalities in LAD distribution, mild-moderate MR, normal RV.    Admitted 11/28/2020 with anemia, hemoglobin of 8.6 (baseline around 10-11). She was transfused 1 unit of PRBC on 11/29/2020. GI was consulted and she underwent EGD on 11/30/2020 which showed no source of bleeding.  Capsule endoscopy on 12/01/2020 showed blood in the mid/distal small bowel and proximal colon.  Colonoscopy on 12/02/2020 showed blood in the terminal ileum but no clear source.  It is suspected that the source of bleeding is in the distal small bowel that is not able to be reached via routine colonoscope.  GI asked IR to consider an arteriogram to localize and treat the source of her bleeding. CT done and felt best option was DBE with Dr. Theodore Demark at [place]. Double balloon endoscopy was done in 12/22, no definite cause of GI bleeding was found.   Echo in 3/23 showed EF 25-30%, apical akinesis, mild MR, normal RV, normal IVC. Echo was done today and reviewed, EF 25% with LAD territory akinesis, mild LV dilation, mild MR, RV normal, IVC normal.   She returns for followup of CHF.  BP continues to run low, she gets lightheaded if she bends over then stands up.  She gets tired and short of breath walking up stairs.  No dyspnea walking on flat ground. Weight  up 2 lbs. No orthopnea/PND.  No chest pain.   ECG (personally reviewed): NSR, LBBB 164 msec, QTc 451  St Jude device interrogation: No VT, stable thoracic impedance.   Labs (10/17): K 3.9, creatinine 1.24, hgb 10.3 Labs (11/17): K 4, creatinine 1.27 Labs (12/17): K 4.2, creatinine 1.12, BNP 439 Labs (2/18): K 4.7, creatinine 1.37 Labs (7/18): K 3.9, creatinine 1.14, hgb 11.7 Labs (12/18): K 4.5, creatinine 1.23 Labs (1/19): K 4.4, creatinine 1.14 Labs (2/19): LDL 97 (off atorvastatin), HDL 56 Labs (5/19): K 4.3, creatinine 1.1 Labs (10/19): K 4.2, creatinine  1.2, LDL 70, hgb 11.8 Labs (11/19): K 4.9, creatinine 1.17, hgb 11.8, LDL 68, low TSH  Labs (9/20): K 4.4, creatinine 1.34, LDL 52 Labs (12/20): K 4.6, creatinine 1.19 Labs (6/21): LDL 73, HDL 62, hgb 11.9, K 4.2, creatinine 1.14 Labs (7/21): hgb 11.2, K 3.8, creatinine 1.12 Labs (10/21): K 4.5, creatinine 1.38, LDL 53, HDL 50 Labs (1/22): K 4.9, creatinine 0.99 Labs (2/22): hgb 11.6, Fe studies normal Labs (5/22): K 5, creatinine 1.05, hgb 11.8 Labs (10/22): K 3.9, creatinine 1.02, hgb 7.1 Labs (11/22): hgb 11.9, K 3.7, creatinine 1.02, BNP 347 Labs (3/23): hgb 13.1 Labs (4/23): K 4.1, creatinine 1.16, pro-BNP 2121 Labs (5/23): LDL 71, TGs 74, K 4.1, creatinine 0.98 Labs (8/23): LDL 62, NT-proBNP 2843, K 3.8, creatinine 1.02 Labs (3/24): LDL 79, TGs 102, K 4.5, creatinine 1.07 Labs (6/24): K 4, creatinine 1.21, BNP 304 Labs (8/24): LDL 55  PMH:  1. CAD: Anterior STEMI in 7/17 with late presentation to the cath lab. She had DES to proximal LAD.  No significant disease in other vessels.  - LHC (10/19): Patent LAD stent, no obstructive disease.  2. Chronic systolic CHF: Ischemic cardiomyopathy.  - Echo 10/17 with EF 25%, akinesis of the mid-apical anteroseptal and anterior walls, apical dyskinesis, moderate to severe MR with incomplete coaptation.  - RHC (10/17): mean RA 4, PA 59/17 mean 38, mean PCWP 27 with prominent V waves suggesting significant MR, CI 3.06, PVR 2 WU.  - ACEI cough.  - Echo (11/17): EF 30-35%, normal RV size and systolic function, severe MR with restriction of posterior leaflet.   - Echo (8/19): EF 30% with regional wall motion abnormalities, normal RV size and systolic function, moderate MR.  - Echo (12/20): EF 30%, LAD wall motion abnormalities, normal RV, PASP 35, moderate functional MR.  - Echo (1/22): EF 25-30%, wall motion abnormalities in LAD distribution, mild-moderate MR, normal RV.  - Echo (3/23): EF 25-30%, apical akinesis, mild MR, normal RV, normal IVC.   - Echo (9/24): EF 25% with LAD territory akinesis, mild LV dilation, mild MR, RV normal, IVC normal.  3. Atrial fibrillation: Paroxysmal.  Noted 7/17 admission peri-MI, also noted again 10/17 admission. She did not tolerate amiodarone due to nausea/anorexia.  4. Mitral regurgitation: Moderate to severe on 10/17 echo, incomplete coaptation.  ?Etiology, her MI (anterior) does not typically lead to ischemic MR.  - Echo in 11/17 with severe MR, posterior leaflet restricted.  - TEE (8/18): EF 30%, moderately dilated LV with akinetic septal and anterior walls, moderate functional MR, normal RV size and systolic function.  - Echo (8/19): Moderate MR - Echo (3/23): Mild MR.  5. Hyperlipidemia: Myalgias with atorvastatin.  6. PVCs: Holter 12/18 with 41% PVCs.  - PVC ablation in 2/19 with recurrence of PVCs.  7. GI bleeding:  - EGD (4/21): unremarkable.  - Colonoscopy (7/21): Diverticulosis.  - Capsule endoscopy (9/21): negative.  -  [facility]oscopy at [facility] in 12/22 did not show cause for bleeding.   SH: Married, lives in [place], works for the [employer], no smoking or ETOH.   Family History  Problem Relation Age of Onset   Heart attack Father    Arrhythmia Mother    Lung cancer Mother        bronchiectasis   Colon cancer Neg Hx    Colon polyps Neg Hx    ROS: All systems reviewed and negative except as per HPI.   Current Outpatient Medications on File Prior to Encounter  Medication Sig Dispense Refill   acetaminophen (TYLENOL) 500 MG tablet Take 1,000 mg by mouth every 6 (six) hours as needed for mild pain.     buPROPion (WELLBUTRIN XL) 300 MG 24 hr tablet Take 150 mg by mouth daily.     Calcium Carb-Cholecalciferol (CALCIUM + D3 PO) Take 1 tablet by mouth every morning.     Coenzyme Q10 (COQ-10) 200 MG CAPS Take 200 mg by mouth daily.     dexlansoprazole (DEXILANT) 60 MG capsule TAKE 1 CAPSULE(60 MG) BY MOUTH DAILY 90 capsule 3   ELIQUIS 5 MG TABS tablet TAKE 1 TABLET(5 MG) BY MOUTH  TWICE DAILY 180 tablet 3   ENTRESTO 24-26 MG TAKE 1 TABLET BY MOUTH TWICE DAILY 60 tablet 11   escitalopram (LEXAPRO) 10 MG tablet Take 1 tablet by mouth daily.     ferrous sulfate 325 (65 FE) MG EC tablet Take 325 mg by mouth daily.     fexofenadine (ALLEGRA) 180 MG tablet Take 180 mg by mouth daily.     furosemide (LASIX) 20 MG tablet TAKE 2 TABLETS(40 MG) BY MOUTH DAILY 90 tablet 3   nitroGLYCERIN (NITROSTAT) 0.4 MG SL tablet PLACE 1 TABLET UNDER THE TONGUE EVERY 5 MINUTES AS NEEDED FOR CHEST PAIN 25 tablet 3   rosuvastatin (CRESTOR) 20 MG tablet Take 1 tablet (20 mg total) by mouth daily. 90 tablet 3   sotalol (BETAPACE) 160 MG tablet Take 1 tablet (160 mg total) by mouth every morning AND 0.5 tablets (80 mg total) every evening. TAKE 1 TABLET BY MOUTH EVERY MORNING AND 1/2 TABLET EVERY EVENING. 135 tablet 3   spironolactone (ALDACTONE) 25 MG tablet TAKE 1 TABLET(25 MG) BY MOUTH DAILY 30 tablet 11   thyroid (ARMOUR) 120 MG tablet Take 120 mg by mouth daily before breakfast.     traZODone (DESYREL) 50 MG tablet Take 0.5 tablets (25 mg total) by mouth at bedtime as needed for sleep. 30 tablet 2   triamcinolone (NASACORT) 55 MCG/ACT AERO nasal inhaler Place 2 sprays into the nose daily.      No current facility-administered medications on file prior to encounter.   Wt Readings from Last 3 Encounters:  11/20/22 70.8 kg (156 lb)  09/25/22 71.6 kg (157 lb 12.8 oz)  08/19/22 70 kg (154 lb 6.4 oz)   BP (!) 90/58   Pulse (!) 57   Wt 70.8 kg (156 lb)   SpO2 99%   BMI 23.04 kg/m  General: NAD Neck: No JVD, no thyromegaly or thyroid nodule.  Lungs: Clear to auscultation bilaterally with normal respiratory effort. CV: Nondisplaced PMI.  Heart regular S1/S2, no S3/S4, no murmur.  No peripheral edema.  No carotid bruit.  Normal pedal pulses.  Abdomen: Soft, nontender, no hepatosplenomegaly, no distention.  Skin: Intact without lesions or rashes.  Neurologic: Alert and oriented x 3.  Psych:  Normal affect. Extremities: No clubbing or cyanosis.  HEENT: Normal.  Assessment/Plan: 1. CAD: S/p anterior MI in 7/17 with DES to RCA.  Cath in 10/19 with no obstructive CAD.  No chest pain.  - She is off Plavix now and taking apixaban 5 mg bid.   - She is tolerating Crestor without myalgias.       2. Chronic systolic CHF: Ischemic cardiomyopathy.  EF 25% on echo in 10/17, 30-35% on echo in 11/17, 30% on TEE in 8/18. She has extensive LAD-territory scar. 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  Echo in 3/23 showed that EF remained 25-30% with LAD-territory scar. Echo today was stable, showing EF 25% with LAD territory akinesis, mild LV dilation, mild MR, RV normal, IVC normal.  She is not volume overloaded on exam, still with NYHA class III symptoms with prominent fatigue.  Low BP has made adjustment of GDMT difficult.  - Continue Lasix 40 mg daily. BMET today.  - Off Coreg with low BP and addition of sotalol.   - Continue Entresto 24/26 bid, no BP room to increase.  - She did not tolerate dapagliflozin due to yeast infections.   - Continue spironolactone 25 mg daily.   - Insurance did not approve her for baroreceptor activation therapy.   - We have discussed cardiac contractility modulation. However, QRS today is significantly wider at 164 msec with LBBB and she would be a candidate for upgrade of device to CRT.  Will discuss with Dr. Lalla Brothers.    3. Mitral regurgitation: Moderate to severe on 10/17 echo, severe on 11/17 echo.  I did a TEE in 8/18.  This showed moderate MR that appeared to be functional.  No indication for surgery or Mitraclip. Echo in 3/23 showed mild MR. Echo today with mild MR.  4. Atrial fibrillation: Unable to tolerate amiodarone due to GI side effects.  She is in NSR today on Eliquis and sotalol. QTc interval is acceptable on sotalol on today's ECG.    5. PVCs: Very frequent on 12/18 holter (41% beats), she has had a PVC ablation in 2/19. Due to recurrence of PVCs post-ablation, she is now  on sotalol. She does not seem to have many PVCs.  - Continue sotalol, QTc ok today.   6. Hyperlipidemia: Tolerating Crestor. Goal LDL < 55.  Good LDL in 8/24.  7. GI bleeding: With anemia.  She had episode in 10/22, has not had overt recurrence. She has been considered for Watchman.   - She wants to hold off on Watchman for now, will to consider with a close recurrence of GI bleeding.   Followup in 3 months with APP  Marca Ancona 11/20/2022

## 2022-11-29 ENCOUNTER — Other Ambulatory Visit: Payer: Self-pay | Admitting: Cardiology

## 2022-12-01 ENCOUNTER — Other Ambulatory Visit (HOSPITAL_COMMUNITY): Payer: Self-pay

## 2022-12-01 MED ORDER — FUROSEMIDE 20 MG PO TABS: ORAL_TABLET | ORAL | 3 refills | Status: DC

## 2022-12-09 ENCOUNTER — Encounter: Payer: Self-pay | Admitting: Cardiology

## 2022-12-11 ENCOUNTER — Other Ambulatory Visit (HOSPITAL_COMMUNITY): Payer: Self-pay | Admitting: Cardiology

## 2023-01-05 ENCOUNTER — Encounter: Payer: BC Managed Care – PPO | Admitting: Cardiology

## 2023-01-16 ENCOUNTER — Encounter (HOSPITAL_COMMUNITY): Payer: Self-pay

## 2023-01-16 ENCOUNTER — Inpatient Hospital Stay (HOSPITAL_COMMUNITY)
Admission: EM | Admit: 2023-01-16 | Discharge: 2023-01-19 | DRG: 309 | Disposition: A | Discharge status: Home / Self Care | Payer: BC Managed Care – PPO | Attending: Internal Medicine | Admitting: Internal Medicine

## 2023-01-16 ENCOUNTER — Emergency Department (HOSPITAL_COMMUNITY): Payer: BC Managed Care – PPO

## 2023-01-16 ENCOUNTER — Telehealth (HOSPITAL_COMMUNITY): Payer: Self-pay | Admitting: Cardiology

## 2023-01-16 DIAGNOSIS — R002 Palpitations: Secondary | ICD-10-CM

## 2023-01-16 DIAGNOSIS — Z955 Presence of coronary angioplasty implant and graft: Secondary | ICD-10-CM

## 2023-01-16 DIAGNOSIS — R7303 Prediabetes: Secondary | ICD-10-CM | POA: Diagnosis present

## 2023-01-16 DIAGNOSIS — Z8249 Family history of ischemic heart disease and other diseases of the circulatory system: Secondary | ICD-10-CM

## 2023-01-16 DIAGNOSIS — N1831 Chronic kidney disease, stage 3a: Secondary | ICD-10-CM

## 2023-01-16 DIAGNOSIS — I252 Old myocardial infarction: Secondary | ICD-10-CM

## 2023-01-16 DIAGNOSIS — Z888 Allergy status to other drugs, medicaments and biological substances status: Secondary | ICD-10-CM

## 2023-01-16 DIAGNOSIS — E039 Hypothyroidism, unspecified: Secondary | ICD-10-CM | POA: Diagnosis present

## 2023-01-16 DIAGNOSIS — R55 Syncope and collapse: Principal | ICD-10-CM

## 2023-01-16 DIAGNOSIS — E785 Hyperlipidemia, unspecified: Secondary | ICD-10-CM | POA: Diagnosis present

## 2023-01-16 DIAGNOSIS — K219 Gastro-esophageal reflux disease without esophagitis: Secondary | ICD-10-CM | POA: Diagnosis present

## 2023-01-16 DIAGNOSIS — I2510 Atherosclerotic heart disease of native coronary artery without angina pectoris: Secondary | ICD-10-CM | POA: Diagnosis present

## 2023-01-16 DIAGNOSIS — I130 Hypertensive heart and chronic kidney disease with heart failure and stage 1 through stage 4 chronic kidney disease, or unspecified chronic kidney disease: Secondary | ICD-10-CM | POA: Diagnosis present

## 2023-01-16 DIAGNOSIS — I4510 Unspecified right bundle-branch block: Secondary | ICD-10-CM | POA: Diagnosis present

## 2023-01-16 DIAGNOSIS — Z825 Family history of asthma and other chronic lower respiratory diseases: Secondary | ICD-10-CM

## 2023-01-16 DIAGNOSIS — I4819 Other persistent atrial fibrillation: Principal | ICD-10-CM | POA: Diagnosis present

## 2023-01-16 DIAGNOSIS — Z7901 Long term (current) use of anticoagulants: Secondary | ICD-10-CM

## 2023-01-16 DIAGNOSIS — Z885 Allergy status to narcotic agent status: Secondary | ICD-10-CM

## 2023-01-16 DIAGNOSIS — I480 Paroxysmal atrial fibrillation: Secondary | ICD-10-CM | POA: Diagnosis present

## 2023-01-16 DIAGNOSIS — Z95810 Presence of automatic (implantable) cardiac defibrillator: Secondary | ICD-10-CM

## 2023-01-16 DIAGNOSIS — F32A Depression, unspecified: Secondary | ICD-10-CM

## 2023-01-16 DIAGNOSIS — D509 Iron deficiency anemia, unspecified: Secondary | ICD-10-CM | POA: Diagnosis present

## 2023-01-16 DIAGNOSIS — I340 Nonrheumatic mitral (valve) insufficiency: Secondary | ICD-10-CM | POA: Diagnosis present

## 2023-01-16 DIAGNOSIS — I5022 Chronic systolic (congestive) heart failure: Secondary | ICD-10-CM | POA: Diagnosis present

## 2023-01-16 DIAGNOSIS — Z79899 Other long term (current) drug therapy: Secondary | ICD-10-CM

## 2023-01-16 DIAGNOSIS — I959 Hypotension, unspecified: Secondary | ICD-10-CM | POA: Diagnosis present

## 2023-01-16 DIAGNOSIS — I255 Ischemic cardiomyopathy: Secondary | ICD-10-CM | POA: Diagnosis present

## 2023-01-16 LAB — CBC
HCT: 37.5 % (ref 36.0–46.0)
Hemoglobin: 11.8 g/dL — ABNORMAL LOW (ref 12.0–15.0)
MCH: 28.8 pg (ref 26.0–34.0)
MCHC: 31.5 g/dL (ref 30.0–36.0)
MCV: 91.5 fL (ref 80.0–100.0)
Platelets: 189 10*3/uL (ref 150–400)
RBC: 4.1 MIL/uL (ref 3.87–5.11)
RDW: 12.9 % (ref 11.5–15.5)
WBC: 6.5 10*3/uL (ref 4.0–10.5)
nRBC: 0 % (ref 0.0–0.2)

## 2023-01-16 LAB — BASIC METABOLIC PANEL
Anion gap: 9 (ref 5–15)
BUN: 27 mg/dL — ABNORMAL HIGH (ref 6–20)
CO2: 24 mmol/L (ref 22–32)
Calcium: 8.9 mg/dL (ref 8.9–10.3)
Chloride: 105 mmol/L (ref 98–111)
Creatinine, Ser: 1.37 mg/dL — ABNORMAL HIGH (ref 0.44–1.00)
GFR, Estimated: 45 mL/min — ABNORMAL LOW (ref >60)
Glucose, Bld: 114 mg/dL — ABNORMAL HIGH (ref 70–99)
Potassium: 4.9 mmol/L (ref 3.5–5.1)
Sodium: 138 mmol/L (ref 135–145)

## 2023-01-16 LAB — BRAIN NATRIURETIC PEPTIDE: B Natriuretic Peptide: 1043.4 pg/mL — ABNORMAL HIGH (ref 0.0–100.0)

## 2023-01-16 LAB — TROPONIN I (HIGH SENSITIVITY)
Troponin I (High Sensitivity): 7 ng/L (ref <18)
Troponin I (High Sensitivity): 7 ng/L (ref <18)

## 2023-01-16 NOTE — ED Provider Notes (Signed)
Catherine Mays   CSN: [id number] Arrival date & time: 01/16/23  1526     History  Chief Complaint  Patient presents with  . Palpitations    Catherine Mays is a 57 y.o. female.  Patient presents to the ED complaining of chest pain, palpitations, dizziness.  She states she has had a 1 week history of generalized symptoms of palpitations, fatigue, dizziness.  Previous history of AF and heart failure with an EF of 25%.  On blood thinners.  Her symptoms have gotten worse over the last 3 days including worsening chest pain radiating to her back between her shoulder blades, blurred vision and dizziness happening every time she stands up or walk short distance.  Endorses 1 episode of shortness of breath yesterday which lasted 30 seconds and  also endorses nausea without vomiting .  Denies lower extremity edema, abdominal pain, urinary symptoms, constipation.  The history is provided by the patient and the spouse.  Palpitations Palpitations quality:  Irregular      Home Medications Prior to Admission medications   Medication Sig Start Date End Date Taking? Authorizing Provider  acetaminophen (TYLENOL) 500 MG tablet Take 1,000 mg by mouth every 6 (six) hours as needed for mild pain.   Yes [provider]  buPROPion (WELLBUTRIN XL) 300 MG 24 hr tablet Take 150 mg by mouth daily. 11/07/20  Yes [provider]  Calcium Carb-Cholecalciferol (CALCIUM + D3 PO) Take 1 tablet by mouth every morning.   Yes [provider]  Coenzyme Q10 (COQ-10) 200 MG CAPS Take 200 mg by mouth daily.   Yes [provider]  dexlansoprazole (DEXILANT) 60 MG capsule TAKE 1 CAPSULE(60 MG) BY MOUTH DAILY 05/09/22  Yes Catherine Burn, MD  ELIQUIS 5 MG TABS tablet TAKE 1 TABLET(5 MG) BY MOUTH TWICE DAILY 02/05/22  Yes [name], MD  ENTRESTO 24-26 MG TAKE 1 TABLET BY MOUTH TWICE DAILY 10/10/22  Yes [name], MD   escitalopram (LEXAPRO) 10 MG tablet Take 1 tablet by mouth daily. 12/22/21  Yes [provider]  ferrous sulfate 325 (65 FE) MG EC tablet Take 325 mg by mouth daily.   Yes [provider]  fexofenadine (ALLEGRA) 180 MG tablet Take 180 mg by mouth daily.   Yes [provider]  furosemide (LASIX) 20 MG tablet TAKE 2 TABLETS(40 MG) BY MOUTH DAILY 12/01/22  Yes [name], MD  nitroGLYCERIN (NITROSTAT) 0.4 MG SL tablet PLACE 1 TABLET UNDER THE TONGUE EVERY 5 MINUTES AS NEEDED FOR CHEST PAIN 04/13/20  Yes [name], MD  rosuvastatin (CRESTOR) 20 MG tablet Take 1 tablet (20 mg total) by mouth daily. 08/27/22  Yes [name], MD  sotalol (BETAPACE) 160 MG tablet Take 1 tablet (160 mg total) by mouth every morning AND 0.5 tablets (80 mg total) every evening. TAKE 1 TABLET BY MOUTH EVERY MORNING AND 1/2 TABLET EVERY EVENING. 10/30/22  Yes [name], MD  spironolactone (ALDACTONE) 25 MG tablet TAKE 1 TABLET(25 MG) BY MOUTH DAILY 05/19/22  Yes [name], MD  thyroid (ARMOUR) 120 MG tablet Take 120 mg by mouth daily before breakfast.   Yes [provider]  traZODone (DESYREL) 50 MG tablet Take 0.5 tablets (25 mg total) by mouth at bedtime as needed for sleep. Patient taking differently: Take 25 mg by mouth at bedtime. 12/04/20  Yes [name], MD  triamcinolone (NASACORT) 55 MCG/ACT AERO nasal inhaler  Place 2 sprays into the nose daily.    Yes [provider]  furosemide (LASIX) 20 MG tablet TAKE 2 TABLETS(40 MG) BY MOUTH DAILY 12/01/22   [name], MD      Allergies    Paxil [paroxetine], Chlorhexidine gluconate, Morphine and codeine, Percocet [oxycodone-acetaminophen], Cefdinir, and Zolpidem    Review of Systems   Review of Systems  Cardiovascular:  Positive for palpitations.    Physical Exam Updated Vital Signs BP 97/76   Pulse 85   Temp 98.9 F (37.2 C) (Oral)   Resp 17   SpO2 98%  Physical Exam Vitals  and nursing Mays reviewed.  Constitutional:      General: She is not in acute distress.    Appearance: Normal appearance. She is not ill-appearing, toxic-appearing or diaphoretic.  HENT:     Head: Normocephalic and atraumatic.     Mouth/Throat:     Mouth: Mucous membranes are moist.     Pharynx: Oropharynx is clear.  Eyes:     Extraocular Movements: Extraocular movements intact.     Conjunctiva/sclera: Conjunctivae normal.     Pupils: Pupils are equal, round, and reactive to light.  Cardiovascular:     Rate and Rhythm: Normal rate. Rhythm irregular.     Pulses: Normal pulses.     Heart sounds: Murmur (Previously diagnosed MR noted) heard.  Pulmonary:     Effort: Pulmonary effort is normal. No respiratory distress.     Breath sounds: Normal breath sounds. No stridor. No wheezing, rhonchi or rales.  Abdominal:     General: Abdomen is flat.     Palpations: Abdomen is soft. There is no mass.     Tenderness: There is no abdominal tenderness. There is no guarding.  Musculoskeletal:        General: Normal range of motion.  Neurological:     General: No focal deficit present.     Mental Status: She is alert. Mental status is at baseline.     Cranial Nerves: No cranial nerve deficit.     Sensory: No sensory deficit.     Motor: No weakness.     Coordination: Coordination normal.     ED Results / Procedures / Treatments   Labs (all labs ordered are listed, but only abnormal results are displayed) Labs Reviewed  BASIC METABOLIC PANEL - Abnormal; Notable for the following components:      Result Value   Glucose, Bld 114 (*)    BUN 27 (*)    Creatinine, Ser 1.37 (*)    GFR, Estimated 45 (*)    All other components within normal limits  CBC - Abnormal; Notable for the following components:   Hemoglobin 11.8 (*)    All other components within normal limits  BRAIN NATRIURETIC PEPTIDE - Abnormal; Notable for the following components:   B Natriuretic Peptide 1,043.4 (*)    All other  components within normal limits  HIV ANTIBODY (ROUTINE TESTING W REFLEX)  BASIC METABOLIC PANEL  CBC  MAGNESIUM  PHOSPHORUS  BRAIN NATRIURETIC PEPTIDE  TSH  TROPONIN I (HIGH SENSITIVITY)  TROPONIN I (HIGH SENSITIVITY)    EKG EKG Interpretation Date/Time:  Friday January 16 2023 16:41:04 EST Ventricular Rate:  95 PR Interval:    QRS Duration:  162 QT Interval:  384 QTC Calculation: 482 R Axis:   -67  Text Interpretation: Atrial fibrillation with premature ventricular or aberrantly conducted complexes Left axis deviation Right bundle branch block Possible Lateral infarct , age undetermined Abnormal ECG When  compared with ECG of 16-Jan-2023 16:36, PREVIOUS ECG IS PRESENT Confirmed by Alona Bene [id number]) on 01/16/2023 11:11:45 PM  Radiology CT Head Wo Contrast  Result Date: 01/16/2023 CLINICAL DATA:  Dizziness and weakness for a week worsening today EXAM: CT HEAD WITHOUT CONTRAST TECHNIQUE: Contiguous axial images were obtained from the base of the skull through the vertex without intravenous contrast. RADIATION DOSE REDUCTION: This exam was performed according to the departmental dose-optimization program which includes automated exposure control, adjustment of the mA and/or kV according to patient size and/or use of iterative reconstruction technique. COMPARISON:  None Available. FINDINGS: Brain: No intracranial hemorrhage, mass effect, or evidence of acute infarct. No hydrocephalus. No extra-axial fluid collection. Vascular: No hyperdense vessel or unexpected calcification. Skull: No fracture or focal lesion. Sinuses/Orbits: No acute finding. Other: None. IMPRESSION: No acute intracranial abnormality. Electronically Signed   By: Minerva Fester M.D.   On: 01/16/2023 19:02   DG Chest 2 View  Result Date: 01/16/2023 CLINICAL DATA:  Chest pain. EXAM: CHEST - 2 VIEW COMPARISON:  04/15/2022. FINDINGS: Bilateral lung fields are clear. Bilateral costophrenic angles are clear. Normal  cardio-mediastinal silhouette.  There is a left-sided ICD. No acute osseous abnormalities. The soft tissues are within normal limits. IMPRESSION: *No active cardiopulmonary disease. Electronically Signed   By: Jules Schick M.D.   On: 01/16/2023 17:41    Procedures Procedures    Medications Ordered in ED Medications  sodium chloride flush (NS) 0.9 % injection 3 mL (3 mLs Intravenous Given 01/17/23 0158)  acetaminophen (TYLENOL) tablet 1,000 mg (has no administration in time range)  rosuvastatin (CRESTOR) tablet 20 mg (has no administration in time range)  sotalol (BETAPACE) tablet 160 mg (has no administration in time range)    And  sotalol (BETAPACE) tablet 80 mg (has no administration in time range)  buPROPion (WELLBUTRIN XL) 24 hr tablet 150 mg (has no administration in time range)  escitalopram (LEXAPRO) tablet 10 mg (has no administration in time range)  traZODone (DESYREL) tablet 25 mg (has no administration in time range)  thyroid (ARMOUR) tablet 120 mg (has no administration in time range)  pantoprazole (PROTONIX) EC tablet 40 mg (has no administration in time range)  apixaban (ELIQUIS) tablet 5 mg (has no administration in time range)  ferrous sulfate tablet 325 mg (has no administration in time range)  furosemide (LASIX) injection 40 mg (40 mg Intravenous Given 01/17/23 0057)    ED Course/ Medical Decision Making/ A&P Clinical Course as of 01/17/23 0450  Sat Jan 17, 2023  0447 ED EKG [CB]    Clinical Course User Index [CB] Lavonia Drafts   }                              Medical Decision Making Amount and/or Complexity of Data Reviewed Labs: ordered. Radiology: ordered.  Risk Prescription drug management. Decision regarding hospitalization.     Patient presents to the ED for concern of near syncope, this involves an extensive number of treatment options, and is a complaint that carries with it a high risk of complications and morbidity.  The differential  diagnosis includes worsening heart failure, ACS, stroke, metabolic disorder   Co morbidities that complicate the patient evaluation  PAF, CHF, PVCs, MR, ischemic cardiomyopathy, GERD, ICD in place   Additional history obtained:  Additional history obtained from  Family, Nursing, Outside Medical Records, and Past Admission     Lab Tests:  I Ordered,  and personally interpreted labs.  The pertinent results include:    BNP 1043.4 BUN 27 Creatinine 1.37 GFR 45  Imaging Studies ordered:  I ordered imaging studies including CT head, chest x-ray I independently visualized and interpreted imaging which showed no acute intracranial pathology, unremarkable, no acute cardiopulmonary pathology, unremarkable. I agree with the radiologist interpretation   Cardiac Monitoring:  The patient was maintained on a cardiac monitor.    Medicines ordered and prescription drug management:  I ordered medication including Lasix for diuresis Reevaluation of the patient after these medicines showed that the patient stayed the same I have reviewed the patients home medicines and have made adjustments as needed   Test Considered:  TEE --better done and in hospital after admission    Consultations Obtained:  I requested consultation with the internal medicine and Dr. Jacqulyn Bath,  and discussed lab and imaging findings as well as pertinent plan - they recommend: Admission with light diuresis and observation.   Problem List / ED Course:  Near syncope --patient is 57 year old female who presents to the ER today due to worsening chest pain, near syncope, dizziness, blurry vision.  She stated the symptoms started 7 days ago.  However was followed up yesterday with a transient episode of shortness of breath.  She states that she also has diarrhea but that is not a new symptom.  She has a previous history of heart failure with an EF of 25% and atrial fibrillation.  On blood thinners.  BNP was elevated from  normal and creatinine and BUN also abnormal.  Consulted with Dr. Jacqulyn Bath and with cardiology who agreed that patient should be admitted for light diuresis and observation.  Patient care was then transferred to cardiology for observation   Reevaluation:  After the interventions noted above, I reevaluated the patient and found that they have :stayed the same    Dispostion:  After consideration of the diagnostic results and the patients response to treatment, I feel that the patent would benefit from admission.   Final Clinical Impression(s) / ED Diagnoses Final diagnoses:  Near syncope  Palpitations    Rx / DC Orders ED Discharge Orders     None         Lunette Stands, [place] 01/17/23 0450    Maia Plan, MD 01/18/23 [phone]

## 2023-01-16 NOTE — Telephone Encounter (Signed)
Pts spouse called to report pt has not felt well for some time however over the past few days reports tightness in chest, tightness in throat, chest pressure, dizziness, nausea, HA that has increased throughout the day  B/p 74/51   Above reviewed with Prince Rome, NP Pt should report to nearest ER for further evaluation    Husband aware and voiced undestanding

## 2023-01-16 NOTE — ED Provider Triage Note (Signed)
Emergency Medicine Provider Triage Evaluation Note  Catherine Mays , a 56 y.o. female  was evaluated in triage.  Pt complains of dizziness.  States she always has dizziness.  Today is much worse.  Endorses associated loss of balance.  Also feeling some tightness in her upper mid chest.  States her blood pressure normally in the 90s but today was closer to the 70s.  Has history of CHF secondary to cardiomyopathy and ICD in place.  Review of Systems  Positive: See above Negative: See above  Physical Exam  BP 92/71   Pulse (!) 54   Temp 98.1 F (36.7 C)   Resp 19   SpO2 100%  Gen:   Awake, no distress   Resp:  Normal effort  MSK:   Moves extremities without difficulty  Other:    Medical Decision Making  Medically screening exam initiated at 4:39 PM.  Appropriate orders placed.  Selest Yox was informed that the remainder of the evaluation will be completed by another provider, this initial triage assessment does not replace that evaluation, and the importance of remaining in the ED until their evaluation is complete.  Work up started   Gareth Eagle, PA-C 01/16/23 1641

## 2023-01-16 NOTE — ED Triage Notes (Addendum)
Pt is coming in for Dizziness that is abnormal outside of her normal dizziness as well as palpitations she is able to feel today. She mentions at home her blood pressure was 74/51 and 78/61. She also mentions when she stands up and goes to walk around she gets this tight feeling in her upper chest that radiates to her upper back . She mentons having a Mi in the past as well as an EF of 25%, and a ongoing headache she has had for around 1 week now.

## 2023-01-16 NOTE — ED Provider Notes (Incomplete)
[place] EMERGENCY DEPARTMENT AT [facility] Provider Note   CSN: [id number] Arrival date & time: 01/16/23  1526     History {Add pertinent medical, surgical, social history, OB history to HPI:1} Chief Complaint  Patient presents with  . Palpitations    Catherine Mays is a 57 y.o. female.  Patient presents to the ED complaining of chest pain, palpitations, dizziness.  She states she has had a 1 week history of generalized symptoms of palpitations, fatigue, dizziness.  Previous history of AF and heart failure with an EF of 25%.  On blood thinners.  Her symptoms have gotten worse over the last 3 days including worsening chest pain radiating to her back between her shoulder blades, blurred vision and dizziness happening every time she stands up or walk short distance.  Endorses 1 episode of shortness of breath yesterday which lasted 30 seconds or so.  The history is provided by the patient and the spouse.  Palpitations Palpitations quality:  Irregular      Home Medications Prior to Admission medications   Medication Sig Start Date End Date Taking? Authorizing Provider  acetaminophen (TYLENOL) 500 MG tablet Take 1,000 mg by mouth every 6 (six) hours as needed for mild pain.    [provider]  buPROPion (WELLBUTRIN XL) 300 MG 24 hr tablet Take 150 mg by mouth daily. 11/07/20   [provider]  Calcium Carb-Cholecalciferol (CALCIUM + D3 PO) Take 1 tablet by mouth every morning.    [provider]  Coenzyme Q10 (COQ-10) 200 MG CAPS Take 200 mg by mouth daily.    [provider]  dexlansoprazole (DEXILANT) 60 MG capsule TAKE 1 CAPSULE(60 MG) BY MOUTH DAILY 05/09/22   [name], MD  ELIQUIS 5 MG TABS tablet TAKE 1 TABLET(5 MG) BY MOUTH TWICE DAILY 02/05/22   [name], MD  ENTRESTO 24-26 MG TAKE 1 TABLET BY MOUTH TWICE DAILY 10/10/22   [name], MD  escitalopram (LEXAPRO) 10 MG tablet Take 1 tablet by mouth daily. 12/22/21    [provider]  ferrous sulfate 325 (65 FE) MG EC tablet Take 325 mg by mouth daily.    [provider]  fexofenadine (ALLEGRA) 180 MG tablet Take 180 mg by mouth daily.    [provider]  furosemide (LASIX) 20 MG tablet TAKE 2 TABLETS(40 MG) BY MOUTH DAILY 12/01/22   [name], MD  furosemide (LASIX) 20 MG tablet TAKE 2 TABLETS(40 MG) BY MOUTH DAILY 12/01/22   [name], MD  nitroGLYCERIN (NITROSTAT) 0.4 MG SL tablet PLACE 1 TABLET UNDER THE TONGUE EVERY 5 MINUTES AS NEEDED FOR CHEST PAIN 04/13/20   [name], MD  rosuvastatin (CRESTOR) 20 MG tablet Take 1 tablet (20 mg total) by mouth daily. 08/27/22   [name], MD  sotalol (BETAPACE) 160 MG tablet Take 1 tablet (160 mg total) by mouth every morning AND 0.5 tablets (80 mg total) every evening. TAKE 1 TABLET BY MOUTH EVERY MORNING AND 1/2 TABLET EVERY EVENING. 10/30/22   [name], MD  spironolactone (ALDACTONE) 25 MG tablet TAKE 1 TABLET(25 MG) BY MOUTH DAILY 05/19/22   [name], MD  thyroid (ARMOUR) 120 MG tablet Take 120 mg by mouth daily before breakfast.    [provider]  traZODone (DESYREL) 50 MG tablet Take 0.5 tablets (25 mg total) by mouth at bedtime as needed for sleep. 12/04/20   [name], MD  triamcinolone (NASACORT) 55 MCG/ACT  AERO nasal inhaler Place 2 sprays into the nose daily.     [provider]      Allergies    Paxil [paroxetine], Chlorhexidine gluconate, Morphine and codeine, Percocet [oxycodone-acetaminophen], Cefdinir, and Zolpidem    Review of Systems   Review of Systems  Cardiovascular:  Positive for palpitations.    Physical Exam Updated Vital Signs BP 100/66 (BP Location: Right Arm)   Pulse 76   Temp 98.7 F (37.1 C) (Oral)   Resp 16   SpO2 100%  Physical Exam  ED Results / Procedures / Treatments   Labs (all labs ordered are listed, but only abnormal results are displayed) Labs Reviewed  BASIC METABOLIC  PANEL - Abnormal; Notable for the following components:      Result Value   Glucose, Bld 114 (*)    BUN 27 (*)    Creatinine, Ser 1.37 (*)    GFR, Estimated 45 (*)    All other components within normal limits  CBC - Abnormal; Notable for the following components:   Hemoglobin 11.8 (*)    All other components within normal limits  BRAIN NATRIURETIC PEPTIDE - Abnormal; Notable for the following components:   B Natriuretic Peptide 1,043.4 (*)    All other components within normal limits  TROPONIN I (HIGH SENSITIVITY)  TROPONIN I (HIGH SENSITIVITY)    EKG None  Radiology CT Head Wo Contrast  Result Date: 01/16/2023 CLINICAL DATA:  Dizziness and weakness for a week worsening today EXAM: CT HEAD WITHOUT CONTRAST TECHNIQUE: Contiguous axial images were obtained from the base of the skull through the vertex without intravenous contrast. RADIATION DOSE REDUCTION: This exam was performed according to the departmental dose-optimization program which includes automated exposure control, adjustment of the mA and/or kV according to patient size and/or use of iterative reconstruction technique. COMPARISON:  None Available. FINDINGS: Brain: No intracranial hemorrhage, mass effect, or evidence of acute infarct. No hydrocephalus. No extra-axial fluid collection. Vascular: No hyperdense vessel or unexpected calcification. Skull: No fracture or focal lesion. Sinuses/Orbits: No acute finding. Other: None. IMPRESSION: No acute intracranial abnormality. Electronically Signed   By: Minerva Fester M.D.   On: 01/16/2023 19:02   DG Chest 2 View  Result Date: 01/16/2023 CLINICAL DATA:  Chest pain. EXAM: CHEST - 2 VIEW COMPARISON:  04/15/2022. FINDINGS: Bilateral lung fields are clear. Bilateral costophrenic angles are clear. Normal cardio-mediastinal silhouette.  There is a left-sided ICD. No acute osseous abnormalities. The soft tissues are within normal limits. IMPRESSION: *No active cardiopulmonary disease.  Electronically Signed   By: Jules Schick M.D.   On: 01/16/2023 17:41    Procedures Procedures  {Document cardiac monitor, telemetry assessment procedure when appropriate:1}  Medications Ordered in ED Medications - No data to display  ED Course/ Medical Decision Making/ A&P   {   Click here for ABCD2, HEART and other calculatorsREFRESH Note before signing :1}                              Medical Decision Making Amount and/or Complexity of Data Reviewed Labs: ordered. Radiology: ordered.   ***  {Document critical care time when appropriate:1} {Document review of labs and clinical decision tools ie heart score, Chads2Vasc2 etc:1}  {Document your independent review of radiology images, and any outside records:1} {Document your discussion with family members, caretakers, and with consultants:1} {Document social determinants of health affecting pt's care:1} {Document your decision making why or why not admission, treatments  were needed:1} Final Clinical Impression(s) / ED Diagnoses Final diagnoses:  None    Rx / DC Orders ED Discharge Orders     None

## 2023-01-17 ENCOUNTER — Other Ambulatory Visit: Payer: Self-pay

## 2023-01-17 ENCOUNTER — Other Ambulatory Visit (HOSPITAL_COMMUNITY): Payer: BC Managed Care – PPO

## 2023-01-17 DIAGNOSIS — K219 Gastro-esophageal reflux disease without esophagitis: Secondary | ICD-10-CM | POA: Diagnosis present

## 2023-01-17 DIAGNOSIS — E039 Hypothyroidism, unspecified: Secondary | ICD-10-CM

## 2023-01-17 DIAGNOSIS — I5022 Chronic systolic (congestive) heart failure: Secondary | ICD-10-CM

## 2023-01-17 DIAGNOSIS — Z7901 Long term (current) use of anticoagulants: Secondary | ICD-10-CM | POA: Diagnosis not present

## 2023-01-17 DIAGNOSIS — I5043 Acute on chronic combined systolic (congestive) and diastolic (congestive) heart failure: Secondary | ICD-10-CM | POA: Diagnosis not present

## 2023-01-17 DIAGNOSIS — Z95810 Presence of automatic (implantable) cardiac defibrillator: Secondary | ICD-10-CM

## 2023-01-17 DIAGNOSIS — I480 Paroxysmal atrial fibrillation: Secondary | ICD-10-CM | POA: Diagnosis not present

## 2023-01-17 DIAGNOSIS — I255 Ischemic cardiomyopathy: Secondary | ICD-10-CM | POA: Diagnosis present

## 2023-01-17 DIAGNOSIS — R002 Palpitations: Secondary | ICD-10-CM | POA: Diagnosis present

## 2023-01-17 DIAGNOSIS — F32A Depression, unspecified: Secondary | ICD-10-CM | POA: Diagnosis present

## 2023-01-17 DIAGNOSIS — Z8249 Family history of ischemic heart disease and other diseases of the circulatory system: Secondary | ICD-10-CM | POA: Diagnosis not present

## 2023-01-17 DIAGNOSIS — N1831 Chronic kidney disease, stage 3a: Secondary | ICD-10-CM | POA: Diagnosis present

## 2023-01-17 DIAGNOSIS — Z955 Presence of coronary angioplasty implant and graft: Secondary | ICD-10-CM | POA: Diagnosis not present

## 2023-01-17 DIAGNOSIS — D509 Iron deficiency anemia, unspecified: Secondary | ICD-10-CM

## 2023-01-17 DIAGNOSIS — Z885 Allergy status to narcotic agent status: Secondary | ICD-10-CM | POA: Diagnosis not present

## 2023-01-17 DIAGNOSIS — R7303 Prediabetes: Secondary | ICD-10-CM | POA: Diagnosis present

## 2023-01-17 DIAGNOSIS — I130 Hypertensive heart and chronic kidney disease with heart failure and stage 1 through stage 4 chronic kidney disease, or unspecified chronic kidney disease: Secondary | ICD-10-CM | POA: Diagnosis present

## 2023-01-17 DIAGNOSIS — Z79899 Other long term (current) drug therapy: Secondary | ICD-10-CM | POA: Diagnosis not present

## 2023-01-17 DIAGNOSIS — E785 Hyperlipidemia, unspecified: Secondary | ICD-10-CM | POA: Diagnosis present

## 2023-01-17 DIAGNOSIS — I2510 Atherosclerotic heart disease of native coronary artery without angina pectoris: Secondary | ICD-10-CM

## 2023-01-17 DIAGNOSIS — I4891 Unspecified atrial fibrillation: Secondary | ICD-10-CM | POA: Diagnosis not present

## 2023-01-17 DIAGNOSIS — I959 Hypotension, unspecified: Secondary | ICD-10-CM | POA: Diagnosis present

## 2023-01-17 DIAGNOSIS — I340 Nonrheumatic mitral (valve) insufficiency: Secondary | ICD-10-CM | POA: Diagnosis present

## 2023-01-17 DIAGNOSIS — I509 Heart failure, unspecified: Secondary | ICD-10-CM | POA: Insufficient documentation

## 2023-01-17 DIAGNOSIS — I4510 Unspecified right bundle-branch block: Secondary | ICD-10-CM | POA: Diagnosis present

## 2023-01-17 DIAGNOSIS — I4819 Other persistent atrial fibrillation: Secondary | ICD-10-CM | POA: Diagnosis present

## 2023-01-17 DIAGNOSIS — Z825 Family history of asthma and other chronic lower respiratory diseases: Secondary | ICD-10-CM | POA: Diagnosis not present

## 2023-01-17 DIAGNOSIS — I252 Old myocardial infarction: Secondary | ICD-10-CM | POA: Diagnosis not present

## 2023-01-17 DIAGNOSIS — I5023 Acute on chronic systolic (congestive) heart failure: Secondary | ICD-10-CM | POA: Diagnosis not present

## 2023-01-17 DIAGNOSIS — Z888 Allergy status to other drugs, medicaments and biological substances status: Secondary | ICD-10-CM | POA: Diagnosis not present

## 2023-01-17 LAB — CBC
HCT: 39.3 % (ref 36.0–46.0)
Hemoglobin: 12.7 g/dL (ref 12.0–15.0)
MCH: 29.1 pg (ref 26.0–34.0)
MCHC: 32.3 g/dL (ref 30.0–36.0)
MCV: 89.9 fL (ref 80.0–100.0)
Platelets: 161 10*3/uL (ref 150–400)
RBC: 4.37 MIL/uL (ref 3.87–5.11)
RDW: 12.8 % (ref 11.5–15.5)
WBC: 7 10*3/uL (ref 4.0–10.5)
nRBC: 0 % (ref 0.0–0.2)

## 2023-01-17 LAB — HIV ANTIBODY (ROUTINE TESTING W REFLEX): HIV Screen 4th Generation wRfx: NONREACTIVE

## 2023-01-17 LAB — BASIC METABOLIC PANEL
Anion gap: 10 (ref 5–15)
BUN: 22 mg/dL — ABNORMAL HIGH (ref 6–20)
CO2: 24 mmol/L (ref 22–32)
Calcium: 9 mg/dL (ref 8.9–10.3)
Chloride: 104 mmol/L (ref 98–111)
Creatinine, Ser: 1.14 mg/dL — ABNORMAL HIGH (ref 0.44–1.00)
GFR, Estimated: 56 mL/min — ABNORMAL LOW (ref >60)
Glucose, Bld: 101 mg/dL — ABNORMAL HIGH (ref 70–99)
Potassium: 4.1 mmol/L (ref 3.5–5.1)
Sodium: 138 mmol/L (ref 135–145)

## 2023-01-17 LAB — PHOSPHORUS: Phosphorus: 3.6 mg/dL (ref 2.5–4.6)

## 2023-01-17 LAB — MAGNESIUM: Magnesium: 2.1 mg/dL (ref 1.7–2.4)

## 2023-01-17 LAB — T4, FREE: Free T4: 1.04 ng/dL (ref 0.61–1.12)

## 2023-01-17 LAB — TSH: TSH: 0.081 u[IU]/mL — ABNORMAL LOW (ref 0.350–4.500)

## 2023-01-17 LAB — BRAIN NATRIURETIC PEPTIDE: B Natriuretic Peptide: 1308.6 pg/mL — ABNORMAL HIGH (ref 0.0–100.0)

## 2023-01-17 MED ORDER — PANTOPRAZOLE SODIUM 40 MG PO TBEC
40.0000 mg | DELAYED_RELEASE_TABLET | Freq: Every day | ORAL | Status: DC
  Administered 2023-01-17 – 2023-01-19 (×3): 40 mg via ORAL
  Filled 2023-01-17 (×3): qty 1

## 2023-01-17 MED ORDER — FUROSEMIDE 10 MG/ML IJ SOLN: 40.0000 mg | Freq: Every day | INTRAMUSCULAR | Status: DC

## 2023-01-17 MED ORDER — FUROSEMIDE 10 MG/ML IJ SOLN
40.0000 mg | Freq: Once | INTRAMUSCULAR | Status: AC
  Administered 2023-01-17: 40 mg via INTRAVENOUS
  Filled 2023-01-17: qty 4

## 2023-01-17 MED ORDER — ESCITALOPRAM OXALATE 10 MG PO TABS
10.0000 mg | ORAL_TABLET | Freq: Every day | ORAL | Status: DC
  Administered 2023-01-17 – 2023-01-19 (×3): 10 mg via ORAL
  Filled 2023-01-17 (×3): qty 1

## 2023-01-17 MED ORDER — THYROID 60 MG PO TABS
120.0000 mg | ORAL_TABLET | Freq: Every day | ORAL | Status: DC
  Filled 2023-01-17: qty 2

## 2023-01-17 MED ORDER — APIXABAN 5 MG PO TABS
5.0000 mg | ORAL_TABLET | Freq: Two times a day (BID) | ORAL | Status: DC
  Administered 2023-01-17 – 2023-01-19 (×5): 5 mg via ORAL
  Filled 2023-01-17 (×5): qty 1

## 2023-01-17 MED ORDER — SOTALOL HCL 80 MG PO TABS
160.0000 mg | ORAL_TABLET | Freq: Every morning | ORAL | Status: DC
  Administered 2023-01-17 – 2023-01-19 (×3): 160 mg via ORAL
  Filled 2023-01-17 (×4): qty 2

## 2023-01-17 MED ORDER — FERROUS SULFATE 325 (65 FE) MG PO TABS
325.0000 mg | ORAL_TABLET | Freq: Every day | ORAL | Status: DC
  Administered 2023-01-17 – 2023-01-19 (×3): 325 mg via ORAL
  Filled 2023-01-17 (×3): qty 1

## 2023-01-17 MED ORDER — THYROID 60 MG PO TABS: 120.0000 mg | ORAL_TABLET | Freq: Every day | ORAL | Status: DC

## 2023-01-17 MED ORDER — ACETAMINOPHEN 500 MG PO TABS
1000.0000 mg | ORAL_TABLET | Freq: Four times a day (QID) | ORAL | Status: DC | PRN
  Administered 2023-01-17 – 2023-01-19 (×3): 1000 mg via ORAL
  Filled 2023-01-17 (×3): qty 2

## 2023-01-17 MED ORDER — SOTALOL HCL 80 MG PO TABS
80.0000 mg | ORAL_TABLET | Freq: Every evening | ORAL | Status: DC
  Administered 2023-01-18: 80 mg via ORAL
  Filled 2023-01-17 (×2): qty 1

## 2023-01-17 MED ORDER — BUPROPION HCL ER (XL) 150 MG PO TB24
150.0000 mg | ORAL_TABLET | Freq: Every day | ORAL | Status: DC
  Administered 2023-01-17 – 2023-01-19 (×3): 150 mg via ORAL
  Filled 2023-01-17 (×3): qty 1

## 2023-01-17 MED ORDER — TRAZODONE HCL 50 MG PO TABS
25.0000 mg | ORAL_TABLET | Freq: Every day | ORAL | Status: DC
  Administered 2023-01-17 – 2023-01-18 (×2): 25 mg via ORAL
  Filled 2023-01-17 (×2): qty 1

## 2023-01-17 MED ORDER — SODIUM CHLORIDE 0.9% FLUSH
3.0000 mL | Freq: Two times a day (BID) | INTRAVENOUS | Status: DC
  Administered 2023-01-17 – 2023-01-19 (×6): 3 mL via INTRAVENOUS

## 2023-01-17 MED ORDER — ROSUVASTATIN CALCIUM 20 MG PO TABS
20.0000 mg | ORAL_TABLET | Freq: Every day | ORAL | Status: DC
  Administered 2023-01-17 – 2023-01-19 (×3): 20 mg via ORAL
  Filled 2023-01-17 (×3): qty 1

## 2023-01-17 NOTE — Progress Notes (Signed)
Patient was admitted from the ED at 10:30 am breathing spontaneously. Alert and orientated, CCMD notified, skin assessment done and skin intact. Patient orientated to the unit, call light and room phone within reach.

## 2023-01-17 NOTE — ED Notes (Signed)
ED TO INPATIENT HANDOFF REPORT  ED Nurse Name and Phone #: Jeanice Lim [phone]  S Name/Age/Gender Catherine Mays 57 y.o. female Room/Bed: 022C/022C  Code Status   Code Status: Full Code  Home/SNF/Other Home Patient oriented to: self, place, time, and situation Is this baseline? Yes   Triage Complete: Triage complete  Chief Complaint Acute exacerbation of CHF (congestive heart failure) (HCC) [I50.9]  Triage Note Pt is coming in for Dizziness that is abnormal outside of her normal dizziness as well as palpitations she is able to feel today. She mentions at home her blood pressure was 74/51 and 78/61. She also mentions when she stands up and goes to walk around she gets this tight feeling in her upper chest that radiates to her upper back . She mentons having a Mi in the past as well as an EF of 25%, and a ongoing headache she has had for around 1 week now.    Allergies Allergies  Allergen Reactions   Paxil [Paroxetine] Palpitations   Chlorhexidine Gluconate Itching   Morphine And Codeine Nausea And Vomiting   Percocet [Oxycodone-Acetaminophen] Nausea And Vomiting   Cefdinir Nausea Only   Zolpidem Other (See Comments)    Doesn't work for patient at all    Level of Care/Admitting Diagnosis ED Disposition     ED Disposition  Admit   Condition  --   Comment  Hospital Area: [facility] [[id number]]  Level of Care: Telemetry Cardiac [[id number]]  May admit patient to [facility] or [facility] if equivalent level of care is available:: No  Covid Evaluation: Asymptomatic - no recent exposure (last 10 days) testing not required  Diagnosis: Acute exacerbation of CHF (congestive heart failure) [facility]) [[id number]]  Admitting Physician: Dolly Rias [[id number]]  Attending Physician: Dolly Rias [[id number]]  Certification:: I certify this patient will need inpatient services for at least 2 midnights  Expected Medical Readiness: 01/19/2023          B Medical/Surgery  History Past Medical History:  Diagnosis Date   AICD (automatic cardioverter/defibrillator) present    [employer]   Anemia    CAD in native artery    a. late recognition of presentation of STEMI 08/2015 s/p DES to LAD, mild residual mRCA.   Chronic systolic CHF (congestive heart failure) (HCC)    CKD (chronic kidney disease), stage III (HCC)    Depression    Hypertension    Hypotension    a. preventing med titration for HF.   Hypothyroidism 09/24/2015   Ischemic cardiomyopathy    Myocardial infarction [facility]) 08/2015   PAF (paroxysmal atrial fibrillation) (HCC) 09/24/2015   a. dx at time of STEMI 08/2015.   Pre-diabetes    Presence of permanent cardiac pacemaker    patient has ICD   PVC's (premature ventricular contractions)    Past Surgical History:  Procedure Laterality Date   BIOPSY  06/16/2019   Procedure: BIOPSY;  Surgeon: Corbin Ade, MD;  [facility];  Service: Endoscopy;;   CARDIAC CATHETERIZATION N/A 09/20/2015   Procedure: Left Heart Cath and Coronary Angiography;  Surgeon: Kathleene Hazel, MD;  Location: [facility] INVASIVE CV LAB;  Service: Cardiovascular;  Laterality: N/A;   CARDIAC CATHETERIZATION N/A 09/20/2015   Procedure: Coronary Stent Intervention;  Surgeon: Kathleene Hazel, MD;  Location: [facility] INVASIVE CV LAB;  Service: Cardiovascular;  Laterality: N/A;   CARDIAC CATHETERIZATION N/A 09/21/2015   Procedure: Left Heart Cath and Coronary Angiography;  Surgeon: Tonny Bollman, MD;  Location: [facility] INVASIVE  CV LAB;  Service: Cardiovascular;  Laterality: N/A;   CARDIAC CATHETERIZATION N/A 11/26/2015   Procedure: Right Heart Cath;  Surgeon: [name], MD;  Location: [facility] INVASIVE CV LAB;  Service: Cardiovascular;  Laterality: N/A;   COLONOSCOPY WITH PROPOFOL N/A 11/23/2017   Dr. Jena Gauss: Diverticulosis, internal hemorrhoids, next colonoscopy in 10 years   COLONOSCOPY WITH PROPOFOL N/A 08/10/2019   Procedure: COLONOSCOPY WITH PROPOFOL;  Surgeon: Corbin Ade, MD;  Diverticulosis in sigmoid colon, nonbleeding internal hemorrhoids, otherwise normal exam.     COLONOSCOPY WITH PROPOFOL N/A 12/02/2020   Procedure: COLONOSCOPY WITH PROPOFOL;  Surgeon: Jeani Hawking, MD;  Location: [facility] ENDOSCOPY;  Service: Endoscopy;  Laterality: N/A;   CORONARY STENT PLACEMENT  09/20/2015   EP IMPLANTABLE DEVICE N/A 01/23/2016   Procedure: ICD Implant;  Surgeon: Will Jorja Loa, MD;  Location: [facility] INVASIVE CV LAB;  Service: Cardiovascular;  Laterality: N/A;   ESOPHAGOGASTRODUODENOSCOPY (EGD) WITH PROPOFOL N/A 06/16/2019   Procedure: ESOPHAGOGASTRODUODENOSCOPY (EGD) WITH PROPOFOL;  Surgeon: Corbin Ade, MD;  Normal esophagus, small hiatal hernia, normal examined stomach, normal examined duodenum.  Gastric biopsy with slight chronic inflammation, duodenal biopsy with slight intramucosal Brunner gland hyperplasia.   ESOPHAGOGASTRODUODENOSCOPY (EGD) WITH PROPOFOL N/A 11/30/2020   Procedure: ESOPHAGOGASTRODUODENOSCOPY (EGD) WITH PROPOFOL;  Surgeon: Jenel Lucks, MD;  Location: [facility] ENDOSCOPY;  Service: Gastroenterology;  Laterality: N/A;   GIVENS CAPSULE STUDY N/A 11/23/2019    Surgeon: Corbin Ade, MD; essentially unremarkable except for tiny erosion in the proximal small bowel.   GIVENS CAPSULE STUDY  11/30/2020   Procedure: GIVENS CAPSULE STUDY;  Surgeon: Jenel Lucks, MD;  Location: [facility] ENDOSCOPY;  Service: Gastroenterology;;   ICD GENERATOR CHANGEOUT N/A 04/14/2022   Procedure: ICD GENERATOR CHANGEOUT;  Surgeon: Lanier Prude, MD;  Location: [facility] INVASIVE CV LAB;  Service: Cardiovascular;  Laterality: N/A;   LEAD EXTRACTION N/A 04/14/2022   Procedure: LEAD EXTRACTION;  Surgeon: Lanier Prude, MD;  Location: [facility] INVASIVE CV LAB;  Service: Cardiovascular;  Laterality: N/A;   LEFT HEART CATH AND CORONARY ANGIOGRAPHY N/A 12/18/2017   Procedure: LEFT HEART CATH AND CORONARY ANGIOGRAPHY;  Surgeon: [name], MD;  Location: [facility] INVASIVE CV LAB;   Service: Cardiovascular;  Laterality: N/A;   PVC ABLATION N/A 04/02/2017   Procedure: PVC ABLATION;  Surgeon: Regan Lemming, MD;  Location: [facility] INVASIVE CV LAB;  Service: Cardiovascular;  Laterality: N/A;   TEE WITHOUT CARDIOVERSION N/A 10/08/2016   Procedure: TRANSESOPHAGEAL ECHOCARDIOGRAM (TEE);  Surgeon: [name], MD;  Location: [facility] ENDOSCOPY;  Service: Cardiovascular;  Laterality: N/A;   THYROIDECTOMY       A IV Location/Drains/Wounds Patient Lines/Drains/Airways Status     Active Line/Drains/Airways     Name Placement date Placement time Site Days   Peripheral IV 01/17/23 20 G Anterior;Left;Proximal Forearm 01/17/23  0048  Forearm  less than 1            Intake/Output Last 24 hours No intake or output data in the 24 hours ending 01/17/23 0153  Labs/Imaging Results for orders placed or performed during the hospital encounter of 01/16/23 (from the past 48 hour(s))  Basic metabolic panel     Status: Abnormal   Collection Time: 01/16/23  4:35 PM  Result Value Ref Range   Sodium 138 135 - 145 mmol/L   Potassium 4.9 3.5 - 5.1 mmol/L   Chloride 105 98 - 111 mmol/L   CO2 24 22 - 32 mmol/L   Glucose, Bld 114 (H) 70 -  99 mg/dL    Comment: Glucose reference range applies only to samples taken after fasting for at least 8 hours.   BUN 27 (H) 6 - 20 mg/dL   Creatinine, Ser 3.66 (H) 0.44 - 1.00 mg/dL   Calcium 8.9 8.9 - 44.0 mg/dL   GFR, Estimated 45 (L) >60 mL/min    Comment: (NOTE) Calculated using the CKD-EPI Creatinine Equation (2021)    Anion gap 9 5 - 15    Comment: Performed at [facility] Lab, [address] [place], [place] [address]  CBC     Status: Abnormal   Collection Time: 01/16/23  4:35 PM  Result Value Ref Range   WBC 6.5 4.0 - 10.5 K/uL   RBC 4.10 3.87 - 5.11 MIL/uL   Hemoglobin 11.8 (L) 12.0 - 15.0 g/dL   HCT 59.5 63.8 - 75.6 %   MCV 91.5 80.0 - 100.0 fL   MCH 28.8 26.0 - 34.0 pg   MCHC 31.5 30.0 - 36.0 g/dL   RDW 43.3 29.5 - 18.8 %    Platelets 189 150 - 400 K/uL   nRBC 0.0 0.0 - 0.2 %    Comment: Performed at [facility], [address], [place], [place] [address]  Troponin I (High Sensitivity)     Status: None   Collection Time: 01/16/23  4:35 PM  Result Value Ref Range   Troponin I (High Sensitivity) 7 <18 ng/L    Comment: (NOTE) Elevated high sensitivity troponin I (hsTnI) values and significant  changes across serial measurements may suggest ACS but many other  chronic and acute conditions are known to elevate hsTnI results.  Refer to the "Links" section for chest pain algorithms and additional  guidance. Performed at [facility], [address], [place], [place] [address]   Brain natriuretic peptide     Status: Abnormal   Collection Time: 01/16/23  4:42 PM  Result Value Ref Range   B Natriuretic Peptide 1,043.4 (H) 0.0 - 100.0 pg/mL    Comment: Performed at [facility], [address], [place], [place] [address]  Troponin I (High Sensitivity)     Status: None   Collection Time: 01/16/23  8:30 PM  Result Value Ref Range   Troponin I (High Sensitivity) 7 <18 ng/L    Comment: (NOTE) Elevated high sensitivity troponin I (hsTnI) values and significant  changes across serial measurements may suggest ACS but many other  chronic and acute conditions are known to elevate hsTnI results.  Refer to the "Links" section for chest pain algorithms and additional  guidance. Performed at [facility], [address], [place], [place] [address]    CT Head Wo Contrast  Result Date: 01/16/2023 CLINICAL DATA:  Dizziness and weakness for a week worsening today EXAM: CT HEAD WITHOUT CONTRAST TECHNIQUE: Contiguous axial images were obtained from the base of the skull through the vertex without intravenous contrast. RADIATION DOSE REDUCTION: This exam was performed according to the departmental dose-optimization program which includes automated exposure control, adjustment of the mA and/or kV according  to patient size and/or use of iterative reconstruction technique. COMPARISON:  None Available. FINDINGS: Brain: No intracranial hemorrhage, mass effect, or evidence of acute infarct. No hydrocephalus. No extra-axial fluid collection. Vascular: No hyperdense vessel or unexpected calcification. Skull: No fracture or focal lesion. Sinuses/Orbits: No acute finding. Other: None. IMPRESSION: No acute intracranial abnormality. Electronically Signed   By: Minerva Fester M.D.   On: 01/16/2023 19:02  DG Chest 2 View  Result Date: 01/16/2023 CLINICAL DATA:  Chest pain. EXAM: CHEST - 2 VIEW COMPARISON:  04/15/2022. FINDINGS: Bilateral lung fields are clear. Bilateral costophrenic angles are clear. Normal cardio-mediastinal silhouette.  There is a left-sided ICD. No acute osseous abnormalities. The soft tissues are within normal limits. IMPRESSION: *No active cardiopulmonary disease. Electronically Signed   By: Jules Schick M.D.   On: 01/16/2023 17:41    Pending Labs Unresulted Labs (From admission, onward)     Start     Ordered   01/17/23 0500  HIV Antibody (routine testing w rflx)  (HIV Antibody (Routine testing w reflex) panel)  Tomorrow morning,   R        01/17/23 0140   01/17/23 0500  Basic metabolic panel  Tomorrow morning,   R        01/17/23 0140   01/17/23 0500  CBC  Tomorrow morning,   R        01/17/23 0140   01/17/23 0500  Magnesium  Tomorrow morning,   R        01/17/23 0140   01/17/23 0500  Phosphorus  Tomorrow morning,   R        01/17/23 0140   01/17/23 0500  Brain natriuretic peptide  Tomorrow morning,   R        01/17/23 0140   01/17/23 0500  TSH  Tomorrow morning,   R        01/17/23 0140            Vitals/Pain Today's Vitals   01/16/23 2012 01/16/23 2300 01/16/23 2315 01/16/23 2330  BP: 100/66 100/79 107/73 100/75  Pulse: 76 78 73 62  Resp: 16 18 18 15   Temp: 98.7 F (37.1 C)     TempSrc: Oral     SpO2: 100% 100% 99% 100%  PainSc:        Isolation  Precautions No active isolations  Medications Medications  sodium chloride flush (NS) 0.9 % injection 3 mL (has no administration in time range)  acetaminophen (TYLENOL) tablet 1,000 mg (has no administration in time range)  rosuvastatin (CRESTOR) tablet 20 mg (has no administration in time range)  sotalol (BETAPACE) tablet 160 mg (has no administration in time range)    And  sotalol (BETAPACE) tablet 80 mg (has no administration in time range)  buPROPion (WELLBUTRIN XL) 24 hr tablet 150 mg (has no administration in time range)  escitalopram (LEXAPRO) tablet 10 mg (has no administration in time range)  traZODone (DESYREL) tablet 25 mg (has no administration in time range)  thyroid (ARMOUR) tablet 120 mg (has no administration in time range)  pantoprazole (PROTONIX) EC tablet 40 mg (has no administration in time range)  apixaban (ELIQUIS) tablet 5 mg (has no administration in time range)  ferrous sulfate EC tablet 325 mg (has no administration in time range)  furosemide (LASIX) injection 40 mg (40 mg Intravenous Given 01/17/23 0057)    Mobility walks     Focused Assessments Cardiac Assessment Handoff:  Cardiac Rhythm: Atrial fibrillation Lab Results  Component Value Date   TROPONINI <0.03 12/18/2017   No results found for: "DDIMER" Does the Patient currently have chest pain? No   , Pulmonary Assessment Handoff:  Lung sounds: Bilateral Breath Sounds: Clear O2 Device: Room Air      R Recommendations: See Admitting Provider Note  Report given to:   Additional Notes:  ICD/pacemaker ([employer])

## 2023-01-17 NOTE — Progress Notes (Addendum)
  Progress Note   Patient: Catherine Mays XBM:[id number] DOB: 02/22/1966 DOA: 01/16/2023     0 DOS: the patient was seen and examined on 01/17/2023   Brief hospital course: Mrs. Resko was admitted to the hospital with the working of hypotension.   57 yo female with the past medical history of coronary artery disease, heart failure, atrial fibrillation, hyperlipidemia, CKD and hypothyroid who presented with dyspnea. Patient reported one to two weeks of dyspnea and orthopnea, worsening symptoms over last 2 days, along with chest pressure. Her blood pressure has been low at home with systolic in the 70's.  On her initial physical examination her blood pressure was 97/76, HR 85, RR 13 and 02 saturation 98%, lungs with no wheezing or rales, heart with S1 and S2 present and regular, positive systolic murmur, abdomen with no distention and no lower extremity edema.  Chest radiograph with no cardiomegaly, no effusions or infiltrates, mild bilateral hilar vascular congestion, defibrillator in place with one right ventricular lead.   EKG 98 bpm, left axis deviation, qtc 505, left bundle branch block, ventricular paced rhythm vs 1st degree AV block with PAC vs underlying atrial fibrillation with no significant ST segment or T wave changes.    Assessment and Plan: * Chronic systolic CHF (congestive heart failure) (HCC) Echocardiogram with reduced LV systolic function with LV 25 to 30%, anterior, apical, inferoapical and anterospetal walls are thinned and akinetic.  Mild LV cavity dilatation, RV systolic function with mild reduction, RVSP 28,8 mmHg, LA with moderate dilatation, left atrial size dilatation moderate.   Volume status seems to be stable today.  She received IV furosemide on admission.  Positive orthostatics today,  Supine 117/84 (89), HR 109 Sitting 125/108 (91), HR 125 Standing 93/69 (78), HR 134  Plan to hold on further diuresis. Plan to resume [employer].  Continue with  sotalol.    CAD in native artery No chest pain, no signs of acute coronary syndrome.   PAF (paroxysmal atrial fibrillation) (HCC) Continue sotalol.  Anticoagulation with apixaban.   Hypothyroidism Continue with levothyroxine.   IDA (iron deficiency anemia) Stable cell counts.         Subjective: Patient is feeling better, but continue to have headache and feeling weak, no chest pain, no dyspnea, no orthopnea.   Physical Exam: Vitals:   01/17/23 0845 01/17/23 0900 01/17/23 0915 01/17/23 1030  BP:    103/78  Pulse:    76  Resp: (!) 22 (!) 25 13 12   Temp:    97.8 F (36.6 C)  TempSrc:    Oral  SpO2:    98%  Weight:      Height:       Neurology awake and alert ENT with no pallor Cardiovascular with S1 and S2 present and regular with no gallops, rubs or murmurs No JVD No lower extremity edema Respiratory with no rales or wheezing, no rhonchi Abdomen with no distention   Data Reviewed:    Family Communication: no family at the bedside   Disposition: Status is: Inpatient Remains inpatient appropriate because: hypotension   Planned Discharge Destination: Home      Author: Coralie Keens, MD 01/17/2023 10:42 AM  For on call review [email or web address].

## 2023-01-17 NOTE — Assessment & Plan Note (Addendum)
Echocardiogram with reduced LV systolic function with LV 25 to 30%, anterior, apical, inferoapical and anterospetal walls are thinned and akinetic.  Mild LV cavity dilatation, RV systolic function with mild reduction, RVSP 28,8 mmHg, LA with moderate dilatation, left atrial size dilatation moderate.   Volume status seems to be stable today.  She received IV furosemide on admission.  Positive orthostatics today,  Supine 117/84 (89), HR 109 Sitting 125/108 (91), HR 125 Standing 93/69 (78), HR 134  Plan to hold on further diuresis. Plan to resume [employer].  Continue with sotalol.

## 2023-01-17 NOTE — Hospital Course (Addendum)
Catherine Mays was admitted to the hospital with the working of hypotension.   57 yo female with the past medical history of coronary artery disease, heart failure, atrial fibrillation, hyperlipidemia, CKD and hypothyroid who presented with dyspnea. Patient reported one to two weeks of dyspnea and orthopnea, worsening symptoms over last 2 days, along with chest pressure. Her blood pressure has been low at home with systolic in the 70's.  On her initial physical examination her blood pressure was 97/76, HR 85, RR 13 and 02 saturation 98%, lungs with no wheezing or rales, heart with S1 and S2 present and regular, positive systolic murmur, abdomen with no distention and no lower extremity edema.  Na 138, K 4,9 Cl 105 bicarbonate 24, glucose 114, bun 27 cr 1,37  BNP 1,043  High sensitive troponin 7 and 7  Wbc 6,5 hgb 11,8 plt 189   Chest radiograph with no cardiomegaly, no effusions or infiltrates, mild bilateral hilar vascular congestion, defibrillator in place with one right ventricular lead.   EKG 98 bpm, left axis deviation, qtc 505, left bundle branch block, ventricular paced rhythm vs 1st degree AV block with PAC vs underlying atrial fibrillation with no significant ST segment or T wave changes.   Head CT with no acute changes.   11/24 patient with persistent hypotension, and atrial fibrillation. Plan for possible direct current cardioversion, possible upgrade CRT device/ AF ablation.   11/25 patient converted to sinus rhythm, plan for follow up with Afib clinic to start [facility] as inpatient next week. Continue to hold on Entresto.

## 2023-01-17 NOTE — Consult Note (Addendum)
Cardiology Consultation   Patient ID: Catherine Mays MRN: [id number]; DOB: 1965/07/06  Admit date: 01/16/2023 Date of Consult: 01/17/2023  PCP:  Benita Stabile, MD   [facility] Providers Cardiologist:  Verne Carrow, MD  Electrophysiologist:  Will Jorja Loa, MD  { AHF: Dr. Shirlee Latch  Patient Profile:   Catherine Mays is a 57 y.o. female with a hx of CAD (s/p STEMI in 08/2015, patent stent by cath in 11/2017), chronic HFrEF (EF 30-35% in 08/2015, at 25-30% in 10/2022), St. Jude ICD in place (recent malfunctioning of RV ICD lead and replacement along with ICD generator replacement in 03/2022), paroxysmal atrial fibrillation, history of GIB, mitral regurgitation, HLD, Stage 3 CKD and PVC's (prior PVC ablation in 03/2017) who is being seen 01/17/2023 for the evaluation of hypotension and CHF at the request of Dr. Lazarus Salines.  History of Present Illness:   Catherine Mays was examined by Dr. Shirlee Latch in 10/2022 and continued to have issues with hypotension with associated lightheadedness with positional changes and shortness of breath with inclines. BP was soft at 90/58 during her office visit and she was continued on Lasix 40 mg daily, Entresto 24-26 mg twice daily and Spironolactone 25 mg daily. She was off Coreg due to low BP and also due to being on Sotalol. Had previously been intolerant to SGLT2 inhibitors due to yeast infections. She had previously been declined for baroreceptor activation therapy but recommendations were to review with EP in regards to consideration of upgrade to CRT-D given her wider QRS and LBBB.   She called the [facility] on 01/16/2023 reporting episodes of chest tightness, dizziness and nausea and BP had been at 74/51 on most recent check. She was advised to go to the ED for further evaluation.   In talking with the patient today, she reports she is chronically dizzy but this is typically worse with positional changes and she is aware to change positions  slowly. Over the past week, she has developed progressive dizziness which has occurred with walking. She has also noticed that her pulse has been irregular but denies any specific palpitations. Reports feeling a discomfort along her upper chest and neck which has been constant for the past 2 days. Also reports orthopnea and PND. No recent abdominal distention or lower extremity edema. Weight has been stable at home. She reports good compliance with her medical therapy and has not missed any recent doses. No recent illnesses. Reports that her thyroid medication has been adjusted by ENT.  BP was at 92/71 upon arrival. Initial labs while in the ED showed WBC 6.5, Hgb 11.8, platelets 189, Na+ 138, K+ 4.9 and creatinine 1.37 (baseline 1.0 - 1.1). BNP 1043. Initial and repeat Hs Troponin negative at 7. TSH 0.081 with normal Free T4 at 1.04. CXR with no active cardiopulmonary disease. CT Head with no acute intracranial abnormalities. EKG shows likely atrial fibrillation, HR 95 with LBBB (was in sinus bradycardia in 10/2022).   She was continued on Eliquis, Crestor and Sotalol on admission. Spironolactone and Entresto were held given hypotension. Also received IV Lasix 40mg  x1. Repeat labs today show K+ at 4.1 and creatinine has improved to 1.14.   Past Medical History:  Diagnosis Date   AICD (automatic cardioverter/defibrillator) present    St. Jude   Anemia    CAD in native artery    a. late recognition of presentation of STEMI 08/2015 s/p DES to LAD, mild residual mRCA.   Chronic systolic CHF (congestive heart failure) (HCC)  CKD (chronic kidney disease), stage III (HCC)    Depression    Hypertension    Hypotension    a. preventing med titration for HF.   Hypothyroidism 09/24/2015   Ischemic cardiomyopathy    Myocardial infarction [facility]) 08/2015   PAF (paroxysmal atrial fibrillation) (HCC) 09/24/2015   a. dx at time of STEMI 08/2015.   Pre-diabetes    Presence of permanent cardiac pacemaker     patient has ICD   PVC's (premature ventricular contractions)     Past Surgical History:  Procedure Laterality Date   BIOPSY  06/16/2019   Procedure: BIOPSY;  Surgeon: Corbin Ade, MD;  Location: [facility];  Service: Endoscopy;;   CARDIAC CATHETERIZATION N/A 09/20/2015   Procedure: Left Heart Cath and Coronary Angiography;  Surgeon: Kathleene Hazel, MD;  Location: [facility] INVASIVE CV LAB;  Service: Cardiovascular;  Laterality: N/A;   CARDIAC CATHETERIZATION N/A 09/20/2015   Procedure: Coronary Stent Intervention;  Surgeon: Kathleene Hazel, MD;  Location: [facility] INVASIVE CV LAB;  Service: Cardiovascular;  Laterality: N/A;   CARDIAC CATHETERIZATION N/A 09/21/2015   Procedure: Left Heart Cath and Coronary Angiography;  Surgeon: Tonny Bollman, MD;  Location: [facility] INVASIVE CV LAB;  Service: Cardiovascular;  Laterality: N/A;   CARDIAC CATHETERIZATION N/A 11/26/2015   Procedure: Right Heart Cath;  Surgeon: [name], MD;  Location: [facility] INVASIVE CV LAB;  Service: Cardiovascular;  Laterality: N/A;   COLONOSCOPY WITH PROPOFOL N/A 11/23/2017   Dr. Jena Gauss: Diverticulosis, internal hemorrhoids, next colonoscopy in 10 years   COLONOSCOPY WITH PROPOFOL N/A 08/10/2019   Procedure: COLONOSCOPY WITH PROPOFOL;  Surgeon: Corbin Ade, MD;  Diverticulosis in sigmoid colon, nonbleeding internal hemorrhoids, otherwise normal exam.     COLONOSCOPY WITH PROPOFOL N/A 12/02/2020   Procedure: COLONOSCOPY WITH PROPOFOL;  Surgeon: Jeani Hawking, MD;  Location: [facility] ENDOSCOPY;  Service: Endoscopy;  Laterality: N/A;   CORONARY STENT PLACEMENT  09/20/2015   EP IMPLANTABLE DEVICE N/A 01/23/2016   Procedure: ICD Implant;  Surgeon: Will Jorja Loa, MD;  Location: [facility] INVASIVE CV LAB;  Service: Cardiovascular;  Laterality: N/A;   ESOPHAGOGASTRODUODENOSCOPY (EGD) WITH PROPOFOL N/A 06/16/2019   Procedure: ESOPHAGOGASTRODUODENOSCOPY (EGD) WITH PROPOFOL;  Surgeon: Corbin Ade, MD;  Normal esophagus, small hiatal  hernia, normal examined stomach, normal examined duodenum.  Gastric biopsy with slight chronic inflammation, duodenal biopsy with slight intramucosal Brunner gland hyperplasia.   ESOPHAGOGASTRODUODENOSCOPY (EGD) WITH PROPOFOL N/A 11/30/2020   Procedure: ESOPHAGOGASTRODUODENOSCOPY (EGD) WITH PROPOFOL;  Surgeon: Jenel Lucks, MD;  Location: [facility] ENDOSCOPY;  Service: Gastroenterology;  Laterality: N/A;   GIVENS CAPSULE STUDY N/A 11/23/2019    Surgeon: Corbin Ade, MD; essentially unremarkable except for tiny erosion in the proximal small bowel.   GIVENS CAPSULE STUDY  11/30/2020   Procedure: GIVENS CAPSULE STUDY;  Surgeon: Jenel Lucks, MD;  Location: [facility] ENDOSCOPY;  Service: Gastroenterology;;   ICD GENERATOR CHANGEOUT N/A 04/14/2022   Procedure: ICD GENERATOR CHANGEOUT;  Surgeon: Lanier Prude, MD;  Location: [facility] INVASIVE CV LAB;  Service: Cardiovascular;  Laterality: N/A;   LEAD EXTRACTION N/A 04/14/2022   Procedure: LEAD EXTRACTION;  Surgeon: Lanier Prude, MD;  Location: [facility] INVASIVE CV LAB;  Service: Cardiovascular;  Laterality: N/A;   LEFT HEART CATH AND CORONARY ANGIOGRAPHY N/A 12/18/2017   Procedure: LEFT HEART CATH AND CORONARY ANGIOGRAPHY;  Surgeon: [name], MD;  Location: [facility] INVASIVE CV LAB;  Service: Cardiovascular;  Laterality: N/A;   PVC ABLATION N/A 04/02/2017   Procedure: PVC ABLATION;  Surgeon: Regan Lemming, MD;  Location: [facility] INVASIVE CV LAB;  Service: Cardiovascular;  Laterality: N/A;   TEE WITHOUT CARDIOVERSION N/A 10/08/2016   Procedure: TRANSESOPHAGEAL ECHOCARDIOGRAM (TEE);  Surgeon: [name], MD;  Location: [facility] ENDOSCOPY;  Service: Cardiovascular;  Laterality: N/A;   THYROIDECTOMY       Home Medications:  Prior to Admission medications   Medication Sig Start Date End Date Taking? Authorizing Provider  acetaminophen (TYLENOL) 500 MG tablet Take 1,000 mg by mouth every 6 (six) hours as needed for mild pain.   Yes [provider]   buPROPion (WELLBUTRIN XL) 300 MG 24 hr tablet Take 150 mg by mouth daily. 11/07/20  Yes [provider]  Calcium Carb-Cholecalciferol (CALCIUM + D3 PO) Take 1 tablet by mouth every morning.   Yes [provider]  Coenzyme Q10 (COQ-10) 200 MG CAPS Take 200 mg by mouth daily.   Yes [provider]  dexlansoprazole (DEXILANT) 60 MG capsule TAKE 1 CAPSULE(60 MG) BY MOUTH DAILY 05/09/22  Yes [name], MD  ELIQUIS 5 MG TABS tablet TAKE 1 TABLET(5 MG) BY MOUTH TWICE DAILY 02/05/22  Yes [name], MD  ENTRESTO 24-26 MG TAKE 1 TABLET BY MOUTH TWICE DAILY 10/10/22  Yes [name], MD  escitalopram (LEXAPRO) 10 MG tablet Take 1 tablet by mouth daily. 12/22/21  Yes [provider]  ferrous sulfate 325 (65 FE) MG EC tablet Take 325 mg by mouth daily.   Yes [provider]  fexofenadine (ALLEGRA) 180 MG tablet Take 180 mg by mouth daily.   Yes [provider]  furosemide (LASIX) 20 MG tablet TAKE 2 TABLETS(40 MG) BY MOUTH DAILY 12/01/22  Yes [name], MD  nitroGLYCERIN (NITROSTAT) 0.4 MG SL tablet PLACE 1 TABLET UNDER THE TONGUE EVERY 5 MINUTES AS NEEDED FOR CHEST PAIN 04/13/20  Yes [name], MD  rosuvastatin (CRESTOR) 20 MG tablet Take 1 tablet (20 mg total) by mouth daily. 08/27/22  Yes [name], MD  sotalol (BETAPACE) 160 MG tablet Take 1 tablet (160 mg total) by mouth every morning AND 0.5 tablets (80 mg total) every evening. TAKE 1 TABLET BY MOUTH EVERY MORNING AND 1/2 TABLET EVERY EVENING. 10/30/22  Yes [name], MD  spironolactone (ALDACTONE) 25 MG tablet TAKE 1 TABLET(25 MG) BY MOUTH DAILY 05/19/22  Yes [name], MD  thyroid (ARMOUR) 120 MG tablet Take 120 mg by mouth daily before breakfast.   Yes [provider]  traZODone (DESYREL) 50 MG tablet Take 0.5 tablets (25 mg total) by mouth at bedtime as needed for sleep. Patient taking differently: Take 25 mg by mouth at bedtime. 12/04/20  Yes  [name], MD  triamcinolone (NASACORT) 55 MCG/ACT AERO nasal inhaler Place 2 sprays into the nose daily.    Yes [provider]  furosemide (LASIX) 20 MG tablet TAKE 2 TABLETS(40 MG) BY MOUTH DAILY 12/01/22   [name], MD    Inpatient Medications: Scheduled Meds:  apixaban  5 mg Oral BID   buPROPion  150 mg Oral Daily   escitalopram  10 mg Oral Daily   ferrous sulfate  325 mg Oral Daily   pantoprazole  40 mg Oral Daily   rosuvastatin  20 mg Oral Daily   sodium chloride flush  3 mL Intravenous Q12H   sotalol  160 mg Oral q morning   And   sotalol  80 mg Oral QPM   traZODone  25 mg  Oral QHS   Continuous Infusions:  PRN Meds: acetaminophen  Allergies:    Allergies  Allergen Reactions   Paxil [Paroxetine] Palpitations   Chlorhexidine Gluconate Itching   Morphine And Codeine Nausea And Vomiting   Percocet [Oxycodone-Acetaminophen] Nausea And Vomiting   Cefdinir Nausea Only   Zolpidem Other (See Comments)    Doesn't work for patient at all    Social History:   Social History   Socioeconomic History   Marital status: Married    Spouse name: Not on file   Number of children: Not on file   Years of education: Not on file   Highest education level: Not on file  Occupational History   Not on file  Tobacco Use   Smoking status: Never   Smokeless tobacco: Never  Vaping Use   Vaping status: Never Used  Substance and Sexual Activity   Alcohol use: No   Drug use: No   Sexual activity: Not on file  Other Topics Concern   Not on file  Social History Narrative   Not on file    Family History:    Family History  Problem Relation Age of Onset   Heart attack Father    Arrhythmia Mother    Lung cancer Mother        bronchiectasis   Colon cancer Neg Hx    Colon polyps Neg Hx      ROS:  Please see the history of present illness.   All other ROS reviewed and negative.     Physical Exam/Data:   Vitals:   01/17/23 0800 01/17/23 0845  01/17/23 0900 01/17/23 0915  BP: 106/83     Pulse: 75     Resp: (!) 22 (!) 22 (!) 25 13  Temp:      TempSrc:      SpO2: 98%     Weight:      Height:        Intake/Output Summary (Last 24 hours) at 01/17/2023 1018 Last data filed at 01/17/2023 0310 Gross per 24 hour  Intake 253 ml  Output 960 ml  Net -707 ml      01/17/2023    5:02 AM 11/20/2022   11:05 AM 09/25/2022    3:52 PM  Last 3 Weights  Weight (lbs) 154 lb 156 lb 157 lb 12.8 oz  Weight (kg) 69.854 kg 70.761 kg 71.578 kg     Body mass index is 22.74 kg/m.  General:  Well nourished, well developed female appearing in no acute distress.  HEENT: normal Neck: no JVD Vascular: No carotid bruits; Distal pulses 2+ bilaterally Cardiac:  normal S1, S2; Irregularly irregular Lungs:  clear to auscultation bilaterally, no wheezing, rhonchi or rales  Abd: soft, nontender, no hepatomegaly  Ext: no pitting edema Musculoskeletal:  No deformities, BUE and BLE strength normal and equal Skin: warm and dry  Neuro:  CNs 2-12 intact, no focal abnormalities noted Psych:  Normal affect   EKG:  The EKG was personally reviewed and demonstrates: Atrial fibrillation, HR 95 with LBBB (was in sinus bradycardia in 10/2022).   Telemetry:  Telemetry was personally reviewed and demonstrates: Atrial fibrillation, heart rate variable from the 70s to 140s with frequent PVCs.  Episodes of NSVT up to 4 beats.  Relevant CV Studies:  LHC: 11/2017 No obstructive coronary disease, patent LAD stent.   Echocardiogram: 10/2022 IMPRESSIONS     1. The anterior, apical, inferoapical and anteroseptal walls are thinned  and akinetic. No LV thrombus by Definity contrast.  Left ventricular  ejection fraction, by estimation, is 25 to 30%. The left ventricle has  severely decreased function. The left  ventricle demonstrates regional wall motion abnormalities (see scoring  diagram/findings for description). The left ventricular internal cavity  size was  mildly dilated. Left ventricular diastolic parameters are  consistent with Grade I diastolic  dysfunction (impaired relaxation).   2. Right ventricular systolic function is mildly reduced. The right  ventricular size is normal. There is normal pulmonary artery systolic  pressure. The estimated right ventricular systolic pressure is 28.8 mmHg.   3. Left atrial size was moderately dilated.   4. The mitral valve is abnormal. Mild mitral valve regurgitation.   5. The aortic valve is tricuspid. Aortic valve regurgitation is not  visualized.   6. The inferior vena cava is normal in size with greater than 50%  respiratory variability, suggesting right atrial pressure of 3 mmHg.   Comparison(s): No significant change from prior study. 05/08/2021: LVEF  25-30%, akinesis inferoseptal/anteroseptal, apical lateral wall and apex.    Laboratory Data:  High Sensitivity Troponin:   Recent Labs  Lab 01/16/23 1635 01/16/23 2030  TROPONINIHS 7 7     Chemistry Recent Labs  Lab 01/16/23 1635 01/17/23 0455  NA 138 138  K 4.9 4.1  CL 105 104  CO2 24 24  GLUCOSE 114* 101*  BUN 27* 22*  CREATININE 1.37* 1.14*  CALCIUM 8.9 9.0  MG  --  2.1  GFRNONAA 45* 56*  ANIONGAP 9 10    No results for input(s): "PROT", "ALBUMIN", "AST", "ALT", "ALKPHOS", "BILITOT" in the last 168 hours. Lipids No results for input(s): "CHOL", "TRIG", "HDL", "LABVLDL", "LDLCALC", "CHOLHDL" in the last 168 hours.  Hematology Recent Labs  Lab 01/16/23 1635 01/17/23 0455  WBC 6.5 7.0  RBC 4.10 4.37  HGB 11.8* 12.7  HCT 37.5 39.3  MCV 91.5 89.9  MCH 28.8 29.1  MCHC 31.5 32.3  RDW 12.9 12.8  PLT 189 161   Thyroid  Recent Labs  Lab 01/17/23 0455  TSH 0.081*  FREET4 1.04    BNP Recent Labs  Lab 01/16/23 1642 01/17/23 0455  BNP 1,043.4* 1,308.6*    DDimer No results for input(s): "DDIMER" in the last 168 hours.   Radiology/Studies:  CT Head Wo Contrast  Result Date: 01/16/2023 CLINICAL DATA:   Dizziness and weakness for a week worsening today EXAM: CT HEAD WITHOUT CONTRAST TECHNIQUE: Contiguous axial images were obtained from the base of the skull through the vertex without intravenous contrast. RADIATION DOSE REDUCTION: This exam was performed according to the departmental dose-optimization program which includes automated exposure control, adjustment of the mA and/or kV according to patient size and/or use of iterative reconstruction technique. COMPARISON:  None Available. FINDINGS: Brain: No intracranial hemorrhage, mass effect, or evidence of acute infarct. No hydrocephalus. No extra-axial fluid collection. Vascular: No hyperdense vessel or unexpected calcification. Skull: No fracture or focal lesion. Sinuses/Orbits: No acute finding. Other: None. IMPRESSION: No acute intracranial abnormality. Electronically Signed   By: Minerva Fester M.D.   On: 01/16/2023 19:02   DG Chest 2 View  Result Date: 01/16/2023 CLINICAL DATA:  Chest pain. EXAM: CHEST - 2 VIEW COMPARISON:  04/15/2022. FINDINGS: Bilateral lung fields are clear. Bilateral costophrenic angles are clear. Normal cardio-mediastinal silhouette.  There is a left-sided ICD. No acute osseous abnormalities. The soft tissues are within normal limits. IMPRESSION: *No active cardiopulmonary disease. Electronically Signed   By: Jules Schick M.D.   On: 01/16/2023 17:41  Assessment and Plan:   1. Atrial Fibrillation with RVR - She has a history of persistent atrial fibrillation and has been on Sotalol as she was previously intolerant to Amiodarone due to GI side effects. Reports her last known episode of atrial fibrillation was several years ago. Suspect her arrhythmia started earlier this week given onset of symptoms at that time. - She has been compliant with Sotalol 160 mg in AM/80 mg in PM and this has been continued at the time of admission. Unable to further titrate AV nodal blocking agents given SBP in the 90's at this time.  Anticipate she will require a DCCV this admission.  he has been compliant with Eliquis as an outpatient but did miss last night's dose as she was in the waiting room and dosing was not ordered until this morning. May require TEE/DCCV given this and will review with MD.  2. CAD - She is s/p STEMI in 08/2015 with patent stent by cath in 11/2017. Hs troponin values have been negative at 7. Continue Crestor 20 mg daily (LDL 55 in 09/2022). She is not on ASA given the need for anticoagulation.  3. Acute on Chronic HFrEF - Her cardiomyopathy dates back to 2017 and EF was at 25 to 30% by most recent imaging in 10/2022. She is found to have an acute CHF exacerbation with BNP elevated at 1043 on admission. Suspect due to atrial fibrillation with RVR  She received IV Lasix 40 mg this morning and would reassess volume status tomorrow. PTA Entresto and Spironolactone are held given hypotension but would plan to resume at the time of discharge. Previously intolerant to SGLT2 inhibitors due to yeast infections and she has not been on a beta-blocker due to hypotension.   4. ICD in place - She recently underwent replacement of her RV ICD lead and ICD generator in 03/2022.  Interrogation 10/2022 showed her device was functioning normally.   5. PVC's  - She underwent PVC ablation in 03/2017 by Dr. Elberta Fortis. Remains on Sotalol.  6. Mitral Regurgitation - This was mild by most recent imaging in 10/2022. Continue to follow as an outpatient.   7.  Stage III CKD - Baseline creatinine appears to be between 1.0 - 1.1. At 1.37 on admission and improving to 1.14 today with diuresis. Continue to follow.  8.  Hypothyroidism - Her TSH is actually low at 0.081 with normal free T4. Could be contributing to her arrhythmia.   On Armour Thyroid 120 mg prior to admission and anticipate dosing will need to be reduced. Defer to the Hospitalist Team.    Risk Assessment/Risk Scores:     CHA2DS2-VASc Score = 3   This indicates a  3.2% annual risk of stroke. The patient's score is based upon: CHF History: 1 HTN History: 0 Diabetes History: 0 Stroke History: 0 Vascular Disease History: 1 Age Score: 0 Gender Score: 1    For questions or updates, please contact [facility] Please consult [email or web address] for contact info under    Signed, Ellsworth Lennox, PA-C  01/17/2023 10:18 AM

## 2023-01-17 NOTE — H&P (Signed)
History and Physical    Catherine Mays ZOX:[id number] DOB: [birth year] DOA: 01/16/2023  PCP: Benita Stabile, MD   Patient coming from: Home   Chief Complaint:  Chief Complaint  Patient presents with   Palpitations    HPI:  Catherine Mays is a 57 y.o. female with hx of CAD with history anterior STEMI, LAD PCI, ICM, heart failure reduced ejection fraction, MR, AICD, paroxysmal A-fib on anticoagulation, history of GI bleeding, IDA, hyperlipidemia, CKD, hypothyroidism, who presented due to 1 day of hypotension with systolic BP in 70s.  Reports normal state of health until approximately 1 to 2 weeks ago developed new onset of orthopnea.  Then in the past week having headaches which is unusual.  In the past 2 days developed chest pressure in her upper chest and lower neck, as well as between her shoulder blades.  This chest tightness occurs with minimal exertion i.e. walking on flat ground and resolves with rest.  She has not taken any nitroglycerin.  Has chronic dyspnea on exertion however no change in this.  No lower extremity edema.  Has noted palpitations over the past week described as irregular beats.  No rapid palpitations that she can tell.  Has been nauseous but no vomiting, no other losses.  Eating and drinking well.  No recent changes in medications.   Review of Systems:  ROS complete and negative except as marked above   Allergies  Allergen Reactions   Paxil [Paroxetine] Palpitations   Chlorhexidine Gluconate Itching   Morphine And Codeine Nausea And Vomiting   Percocet [Oxycodone-Acetaminophen] Nausea And Vomiting   Cefdinir Nausea Only   Zolpidem Other (See Comments)    Doesn't work for patient at all    Prior to Admission medications   Medication Sig Start Date End Date Taking? Authorizing Provider  acetaminophen (TYLENOL) 500 MG tablet Take 1,000 mg by mouth every 6 (six) hours as needed for mild pain.   Yes [provider]  buPROPion (WELLBUTRIN XL) 300 MG 24 hr  tablet Take 150 mg by mouth daily. 11/07/20  Yes [provider]  Calcium Carb-Cholecalciferol (CALCIUM + D3 PO) Take 1 tablet by mouth every morning.   Yes [provider]  Coenzyme Q10 (COQ-10) 200 MG CAPS Take 200 mg by mouth daily.   Yes [provider]  dexlansoprazole (DEXILANT) 60 MG capsule TAKE 1 CAPSULE(60 MG) BY MOUTH DAILY 05/09/22  Yes [name], MD  ELIQUIS 5 MG TABS tablet TAKE 1 TABLET(5 MG) BY MOUTH TWICE DAILY 02/05/22  Yes [name], MD  ENTRESTO 24-26 MG TAKE 1 TABLET BY MOUTH TWICE DAILY 10/10/22  Yes [name], MD  escitalopram (LEXAPRO) 10 MG tablet Take 1 tablet by mouth daily. 12/22/21  Yes [provider]  ferrous sulfate 325 (65 FE) MG EC tablet Take 325 mg by mouth daily.   Yes [provider]  fexofenadine (ALLEGRA) 180 MG tablet Take 180 mg by mouth daily.   Yes [provider]  furosemide (LASIX) 20 MG tablet TAKE 2 TABLETS(40 MG) BY MOUTH DAILY 12/01/22  Yes [name], MD  nitroGLYCERIN (NITROSTAT) 0.4 MG SL tablet PLACE 1 TABLET UNDER THE TONGUE EVERY 5 MINUTES AS NEEDED FOR CHEST PAIN 04/13/20  Yes [name], MD  rosuvastatin (CRESTOR) 20 MG tablet Take 1 tablet (20 mg total) by mouth daily. 08/27/22  Yes [name], MD  sotalol (BETAPACE) 160 MG tablet Take 1 tablet (160 mg total) by mouth every morning AND  0.5 tablets (80 mg total) every evening. TAKE 1 TABLET BY MOUTH EVERY MORNING AND 1/2 TABLET EVERY EVENING. 10/30/22  Yes [name], MD  spironolactone (ALDACTONE) 25 MG tablet TAKE 1 TABLET(25 MG) BY MOUTH DAILY 05/19/22  Yes [name], MD  thyroid (ARMOUR) 120 MG tablet Take 120 mg by mouth daily before breakfast.   Yes [provider]  traZODone (DESYREL) 50 MG tablet Take 0.5 tablets (25 mg total) by mouth at bedtime as needed for sleep. Patient taking differently: Take 25 mg by mouth at bedtime. 12/04/20  Yes [name], MD  triamcinolone  (NASACORT) 55 MCG/ACT AERO nasal inhaler Place 2 sprays into the nose daily.    Yes [provider]  furosemide (LASIX) 20 MG tablet TAKE 2 TABLETS(40 MG) BY MOUTH DAILY 12/01/22   [name], MD    Past Medical History:  Diagnosis Date   AICD (automatic cardioverter/defibrillator) present    [employer]   Anemia    CAD in native artery    a. late recognition of presentation of STEMI 08/2015 s/p DES to LAD, mild residual mRCA.   Chronic systolic CHF (congestive heart failure) (HCC)    CKD (chronic kidney disease), stage III (HCC)    Depression    Hypertension    Hypotension    a. preventing med titration for HF.   Hypothyroidism 09/24/2015   Ischemic cardiomyopathy    Myocardial infarction [facility]) 08/2015   PAF (paroxysmal atrial fibrillation) (HCC) 09/24/2015   a. dx at time of STEMI 08/2015.   Pre-diabetes    Presence of permanent cardiac pacemaker    patient has ICD   PVC's (premature ventricular contractions)     Past Surgical History:  Procedure Laterality Date   BIOPSY  06/16/2019   Procedure: BIOPSY;  Surgeon: Corbin Ade, MD;  Location: [facility];  Service: Endoscopy;;   CARDIAC CATHETERIZATION N/A 09/20/2015   Procedure: Left Heart Cath and Coronary Angiography;  Surgeon: Kathleene Hazel, MD;  Location: [facility] INVASIVE CV LAB;  Service: Cardiovascular;  Laterality: N/A;   CARDIAC CATHETERIZATION N/A 09/20/2015   Procedure: Coronary Stent Intervention;  Surgeon: Kathleene Hazel, MD;  Location: [facility] INVASIVE CV LAB;  Service: Cardiovascular;  Laterality: N/A;   CARDIAC CATHETERIZATION N/A 09/21/2015   Procedure: Left Heart Cath and Coronary Angiography;  Surgeon: Tonny Bollman, MD;  Location: [facility] INVASIVE CV LAB;  Service: Cardiovascular;  Laterality: N/A;   CARDIAC CATHETERIZATION N/A 11/26/2015   Procedure: Right Heart Cath;  Surgeon: [name], MD;  Location: [facility] INVASIVE CV LAB;  Service: Cardiovascular;  Laterality: N/A;   COLONOSCOPY WITH  PROPOFOL N/A 11/23/2017   Dr. Jena Gauss: Diverticulosis, internal hemorrhoids, next colonoscopy in 10 years   COLONOSCOPY WITH PROPOFOL N/A 08/10/2019   Procedure: COLONOSCOPY WITH PROPOFOL;  Surgeon: Corbin Ade, MD;  Diverticulosis in sigmoid colon, nonbleeding internal hemorrhoids, otherwise normal exam.     COLONOSCOPY WITH PROPOFOL N/A 12/02/2020   Procedure: COLONOSCOPY WITH PROPOFOL;  Surgeon: Jeani Hawking, MD;  Location: [facility] ENDOSCOPY;  Service: Endoscopy;  Laterality: N/A;   CORONARY STENT PLACEMENT  09/20/2015   EP IMPLANTABLE DEVICE N/A 01/23/2016   Procedure: ICD Implant;  Surgeon: Will Jorja Loa, MD;  Location: [facility] INVASIVE CV LAB;  Service: Cardiovascular;  Laterality: N/A;   ESOPHAGOGASTRODUODENOSCOPY (EGD) WITH PROPOFOL N/A 06/16/2019   Procedure: ESOPHAGOGASTRODUODENOSCOPY (EGD) WITH PROPOFOL;  Surgeon: Corbin Ade, MD;  Normal esophagus, small hiatal hernia, normal examined stomach, normal examined duodenum.  Gastric  biopsy with slight chronic inflammation, duodenal biopsy with slight intramucosal Brunner gland hyperplasia.   ESOPHAGOGASTRODUODENOSCOPY (EGD) WITH PROPOFOL N/A 11/30/2020   Procedure: ESOPHAGOGASTRODUODENOSCOPY (EGD) WITH PROPOFOL;  Surgeon: Jenel Lucks, MD;  Location[facility] ENDOSCOPY;  Service: Gastroenterology;  Laterality: N/A;   GIVENS CAPSULE STUDY N/A 11/23/2019    Surgeon: Corbin Ade, MD; essentially unremarkable except for tiny erosion in the proximal small bowel.   GIVENS CAPSULE STUDY  11/30/2020   Procedure: GIVENS CAPSULE STUDY;  Surgeon: Jenel Lucks, MD;  Location: [facility] ENDOSCOPY;  Service: Gastroenterology;;   ICD GENERATOR CHANGEOUT N/A 04/14/2022   Procedure: ICD GENERATOR CHANGEOUT;  Surgeon: Lanier Prude, MD;  Location: [facility] INVASIVE CV LAB;  Service: Cardiovascular;  Laterality: N/A;   LEAD EXTRACTION N/A 04/14/2022   Procedure: LEAD EXTRACTION;  Surgeon: Lanier Prude, MD;  Location: [facility] INVASIVE CV LAB;  Service:  Cardiovascular;  Laterality: N/A;   LEFT HEART CATH AND CORONARY ANGIOGRAPHY N/A 12/18/2017   Procedure: LEFT HEART CATH AND CORONARY ANGIOGRAPHY;  Surgeon: [name], MD;  Location: [facility] INVASIVE CV LAB;  Service: Cardiovascular;  Laterality: N/A;   PVC ABLATION N/A 04/02/2017   Procedure: PVC ABLATION;  Surgeon: Regan Lemming, MD;  Location: [facility] INVASIVE CV LAB;  Service: Cardiovascular;  Laterality: N/A;   TEE WITHOUT CARDIOVERSION N/A 10/08/2016   Procedure: TRANSESOPHAGEAL ECHOCARDIOGRAM (TEE);  Surgeon: [name], MD;  Location: [facility] ENDOSCOPY;  Service: Cardiovascular;  Laterality: N/A;   THYROIDECTOMY       reports that she has never smoked. She has never used smokeless tobacco. She reports that she does not drink alcohol and does not use drugs.  Family History  Problem Relation Age of Onset   Heart attack Father    Arrhythmia Mother    Lung cancer Mother        bronchiectasis   Colon cancer Neg Hx    Colon polyps Neg Hx      Physical Exam: Vitals:   01/17/23 0206 01/17/23 0345 01/17/23 0400 01/17/23 0502  BP:  103/72 97/76   Pulse:  (!) 44 85   Resp:  13 17   Temp: 98.9 F (37.2 C)     TempSrc: Oral     SpO2:  96% 98%   Weight:    69.9 kg  Height:    5\' 9"  (1.753 m)    Gen: Awake, alert, NAD   CV: Regular, normal S1, S2, 2/6 SEM Resp: Normal WOB, CTAB  Abd: Flat, normoactive, nontender MSK: Symmetric, no edema  Skin: No rashes or lesions to exposed skin  Neuro: Alert and interactive  Psych: euthymic, appropriate    Data review:   Labs reviewed, notable for:   BNP 1043, troponin 7 x 2 Creatinine 1.3, baseline 1.1   Micro:  Results for orders placed or performed during the hospital encounter of 04/14/22  SARS Coronavirus 2 by RT PCR (hospital order, performed in [facility] hospital lab) *cepheid single result test* Anterior Nasal Swab     Status: None   Collection Time: 04/14/22 12:34 PM   Specimen: Anterior Nasal Swab  Result Value Ref  Range Status   SARS Coronavirus 2 by RT PCR NEGATIVE NEGATIVE Final    Comment: Performed at [facility], [address], [place], [place] [address]  Surgical PCR screen     Status: None   Collection Time: 04/14/22 12:35 PM   Specimen: Nasal Mucosa; Nasal Swab  Result Value Ref Range Status  MRSA, PCR NEGATIVE NEGATIVE Final   Staphylococcus aureus NEGATIVE NEGATIVE Final    Comment: (NOTE) The Xpert SA Assay (FDA approved for NASAL specimens in patients 74 years of age and older), is one component of a comprehensive surveillance program. It is not intended to diagnose infection nor to guide or monitor treatment. Performed at [facility], [address] [place], [place] [address]     Imaging reviewed:  CT Head Wo Contrast  Result Date: 01/16/2023 CLINICAL DATA:  Dizziness and weakness for a week worsening today EXAM: CT HEAD WITHOUT CONTRAST TECHNIQUE: Contiguous axial images were obtained from the base of the skull through the vertex without intravenous contrast. RADIATION DOSE REDUCTION: This exam was performed according to the departmental dose-optimization program which includes automated exposure control, adjustment of the mA and/or kV according to patient size and/or use of iterative reconstruction technique. COMPARISON:  None Available. FINDINGS: Brain: No intracranial hemorrhage, mass effect, or evidence of acute infarct. No hydrocephalus. No extra-axial fluid collection. Vascular: No hyperdense vessel or unexpected calcification. Skull: No fracture or focal lesion. Sinuses/Orbits: No acute finding. Other: None. IMPRESSION: No acute intracranial abnormality. Electronically Signed   By: Minerva Fester M.D.   On: 01/16/2023 19:02   DG Chest 2 View  Result Date: 01/16/2023 CLINICAL DATA:  Chest pain. EXAM: CHEST - 2 VIEW COMPARISON:  04/15/2022. FINDINGS: Bilateral lung fields are clear. Bilateral costophrenic angles are clear. Normal cardio-mediastinal silhouette.   There is a left-sided ICD. No acute osseous abnormalities. The soft tissues are within normal limits. IMPRESSION: *No active cardiopulmonary disease. Electronically Signed   By: Jules Schick M.D.   On: 01/16/2023 17:41    EKG:  A-fib, LAD, T wave inversions laterally  ED Course:  Case was discussed with cardiology fellow overnight.  Recommended treatment with Lasix 40 mg IV.  Per EDP cards fellow planning to contact heart failure service to see in the morning.   Assessment/Plan:  57 y.o. female with hx CAD with history anterior STEMI, LAD PCI, ICM, heart failure reduced ejection fraction, MR, AICD, paroxysmal A-fib on anticoagulation, history of GI bleeding, IDA, hyperlipidemia, CKD, hypothyroidism, who presented due to 1 day of hypotension with systolic BP in 70s.   Hypotension, resolved With constellation of symptoms including new orthopnea, chest tightness, palpitations and noted to have A-fib on the ED admission-question if symptoms may be driven by A-fib causing slight worsening of heart failure symptoms.  Possibly also resulting in decreased output relatively and hypotension with systolics in 70s at home. No hypotension since her arrival here systolics mainly in the 90s-100s which she states is baseline. -EDP consulted cardiology fellow recommending diuresis given elevated BNP, status post Lasix 40 mg IV overnight.  Cardiology fellow to contact heart failure team for consult in the morning.  Please reach out to their service to confirm. -AICD interrogation ordered and pending -TTE ordered -For now hold Entresto, spironolactone given recent hypotension -Check orthostatics in the morning -Physical therapy evaluation  Heart failure with reduced ejection fraction, with mild exacerbation Last TTE 9/'24 LVEF 25 to 30%, multiple wall motion abnormalities, mild LV dilation, grade 1 diastolic dysfunction, RV systolic function mildly reduced, mild MR. BNP elevated to 1043. CXR without pulm  edema.  -Diuresis per above - Heart failure team evaluation and repeat echo per above -GDMT: Holding Entresto, spironolactone given her recent hypotension.  Continue her sotalol for history A-fib/PVC.  Paroxysmal A-fib, currently A-fib with controlled rates - Continue home apixaban, sotalol  Exertional chest pressure  History CAD, history anterior STEMI Possibly anginal type symptoms relieved by rest.  EKG with no overt ischemic changes, high-sensitivity troponin is negative x 2.  States pain is not similar to prior MI, sometimes has similar symptoms when she had PVCs in the past. - Continue home DOAC, rosuvastatin  Hx Hypothyroidism, possible thyrotoxicosis:  TSH low 0.08; check fT4 and total T3 may need reduction in dose given arrhythmia.  Held Armour Thyroid for now.   Chronic medical problems: history of GI bleeding, IDA: Hemoglobin 12 near baseline Hyperlipidemia: Continue home statin CKD 3: Baseline creatinine near 1.1, mild elevation to 1.3, question cardiorenal.  Diuresis per above Mood d/o: Continue home Bupropion, lexapro GERD: Continue home PPI    Body mass index is 22.74 kg/m.    DVT prophylaxis:  Eliquis Code Status:  Full Code Diet:  Diet Orders (From admission, onward)     Start     Ordered   01/17/23 0139  Diet 2 gram sodium Room service appropriate? Yes; Fluid consistency: Thin  Diet effective now       Comments: Heart healthy  Question Answer Comment  Room service appropriate? Yes   Fluid consistency: Thin      01/17/23 0140           Family Communication:  No   Consults:  Cardiology   Admission status:   Inpatient, Telemetry bed  Severity of Illness: The appropriate patient status for this patient is INPATIENT. Inpatient status is judged to be reasonable and necessary in order to provide the required intensity of service to ensure the patient's safety. The patient's presenting symptoms, physical exam findings, and initial radiographic and  laboratory data in the context of their chronic comorbidities is felt to place them at high risk for further clinical deterioration. Furthermore, it is not anticipated that the patient will be medically stable for discharge from the hospital within 2 midnights of admission.   * I certify that at the point of admission it is my clinical judgment that the patient will require inpatient hospital care spanning beyond 2 midnights from the point of admission due to high intensity of service, high risk for further deterioration and high frequency of surveillance required.*   Dolly Rias, MD [facility]  How to contact the [facility] Attending or Consulting provider 7A - 7P or covering provider during after hours 7P -7A, for this patient.  Check the care team in [facility] and look for a) attending/consulting [facility] provider listed and b) the [facility] team listed Log into [email or web address] and use [place]'s universal password to access. If you do not have the password, please contact the hospital operator. Locate the [facility] provider you are looking for under [facility] and page to a number that you can be directly reached. If you still have difficulty reaching the provider, please page the [facility] (Director on Call) for the Hospitalists listed on amion for assistance.  01/17/2023, 5:37 AM

## 2023-01-17 NOTE — ED Notes (Signed)
ED TO INPATIENT HANDOFF REPORT  ED Nurse Name and Phone #: Clemmie Buelna [phone]  S Name/Age/Gender Catherine Mays 57 y.o. female Room/Bed: 038C/038C  Code Status   Code Status: Full Code  Home/SNF/Other Home Patient oriented to: self, place, time, and situation Is this baseline? Yes   Triage Complete: Triage complete  Chief Complaint Acute exacerbation of CHF (congestive heart failure) (HCC) [I50.9]  Triage Note Pt is coming in for Dizziness that is abnormal outside of her normal dizziness as well as palpitations she is able to feel today. She mentions at home her blood pressure was 74/51 and 78/61. She also mentions when she stands up and goes to walk around she gets this tight feeling in her upper chest that radiates to her upper back . She mentons having a Mi in the past as well as an EF of 25%, and a ongoing headache she has had for around 1 week now.    Allergies Allergies  Allergen Reactions   Paxil [Paroxetine] Palpitations   Chlorhexidine Gluconate Itching   Morphine And Codeine Nausea And Vomiting   Percocet [Oxycodone-Acetaminophen] Nausea And Vomiting   Cefdinir Nausea Only   Zolpidem Other (See Comments)    Doesn't work for patient at all    Level of Care/Admitting Diagnosis ED Disposition     ED Disposition  Admit   Condition  --   Comment  Hospital Area: [facility] [[id number]]  Level of Care: Telemetry Cardiac [[id number]]  May admit patient to [facility] or [facility] if equivalent level of care is available:: No  Covid Evaluation: Asymptomatic - no recent exposure (last 10 days) testing not required  Diagnosis: Acute exacerbation of CHF (congestive heart failure) [facility]) [[id number]]  Admitting Physician: Dolly Rias [[id number]]  Attending Physician: Dolly Rias [[id number]]  Certification:: I certify this patient will need inpatient services for at least 2 midnights  Expected Medical Readiness: 01/19/2023          B Medical/Surgery  History Past Medical History:  Diagnosis Date   AICD (automatic cardioverter/defibrillator) present    [employer]. [employer]   Anemia    CAD in native artery    a. late recognition of presentation of STEMI 08/2015 s/p DES to LAD, mild residual mRCA.   Chronic systolic CHF (congestive heart failure) (HCC)    CKD (chronic kidney disease), stage III (HCC)    Depression    Hypertension    Hypotension    a. preventing med titration for HF.   Hypothyroidism 09/24/2015   Ischemic cardiomyopathy    Myocardial infarction [facility]) 08/2015   PAF (paroxysmal atrial fibrillation) (HCC) 09/24/2015   a. dx at time of STEMI 08/2015.   Pre-diabetes    Presence of permanent cardiac pacemaker    patient has ICD   PVC's (premature ventricular contractions)    Past Surgical History:  Procedure Laterality Date   BIOPSY  06/16/2019   Procedure: BIOPSY;  Surgeon: Corbin Ade, MD;  Location: [facility] ENDO [facility]ITE;  Service: Endoscopy;;   CARDIAC CATHETERIZATION N/A 09/20/2015   Procedure: Left Heart Cath and Coronary Angiography;  Surgeon: Kathleene Hazel, MD;  Location: [facility] INVASIVE CV LAB;  Service: Cardiovascular;  Laterality: N/A;   CARDIAC CATHETERIZATION N/A 09/20/2015   Procedure: Coronary Stent Intervention;  Surgeon: Kathleene Hazel, MD;  Location: [facility] INVASIVE CV LAB;  Service: Cardiovascular;  Laterality: N/A;   CARDIAC CATHETERIZATION N/A 09/21/2015   Procedure: Left Heart Cath and Coronary Angiography;  Surgeon: Tonny Bollman, MD;  Location: [facility] INVASIVE  CV LAB;  Service: Cardiovascular;  Laterality: N/A;   CARDIAC CATHETERIZATION N/A 11/26/2015   Procedure: Right Heart Cath;  Surgeon: [name], MD;  Location: [facility] INVASIVE CV LAB;  Service: Cardiovascular;  Laterality: N/A;   COLONOSCOPY WITH PROPOFOL N/A 11/23/2017   Dr. Jena Gauss: Diverticulosis, internal hemorrhoids, next colonoscopy in 10 years   COLONOSCOPY WITH PROPOFOL N/A 08/10/2019   Procedure: COLONOSCOPY WITH PROPOFOL;  Surgeon: Corbin Ade, MD;  Diverticulosis in sigmoid colon, nonbleeding internal hemorrhoids, otherwise normal exam.     COLONOSCOPY WITH PROPOFOL N/A 12/02/2020   Procedure: COLONOSCOPY WITH PROPOFOL;  Surgeon: Jeani Hawking, MD;  Location: [facility] ENDOSCOPY;  Service: Endoscopy;  Laterality: N/A;   CORONARY STENT PLACEMENT  09/20/2015   EP IMPLANTABLE DEVICE N/A 01/23/2016   Procedure: ICD Implant;  Surgeon: Will Jorja Loa, MD;  Location: [facility] INVASIVE CV LAB;  Service: Cardiovascular;  Laterality: N/A;   ESOPHAGOGASTRODUODENOSCOPY (EGD) WITH PROPOFOL N/A 06/16/2019   Procedure: ESOPHAGOGASTRODUODENOSCOPY (EGD) WITH PROPOFOL;  Surgeon: Corbin Ade, MD;  Normal esophagus, small hiatal hernia, normal examined stomach, normal examined duodenum.  Gastric biopsy with slight chronic inflammation, duodenal biopsy with slight intramucosal Brunner gland hyperplasia.   ESOPHAGOGASTRODUODENOSCOPY (EGD) WITH PROPOFOL N/A 11/30/2020   Procedure: ESOPHAGOGASTRODUODENOSCOPY (EGD) WITH PROPOFOL;  Surgeon: Jenel Lucks, MD;  Location: [facility] ENDOSCOPY;  Service: Gastroenterology;  Laterality: N/A;   GIVENS CAPSULE STUDY N/A 11/23/2019    Surgeon: Corbin Ade, MD; essentially unremarkable except for tiny erosion in the proximal small bowel.   GIVENS CAPSULE STUDY  11/30/2020   Procedure: GIVENS CAPSULE STUDY;  Surgeon: Jenel Lucks, MD;  Location: [facility] ENDOSCOPY;  Service: Gastroenterology;;   ICD GENERATOR CHANGEOUT N/A 04/14/2022   Procedure: ICD GENERATOR CHANGEOUT;  Surgeon: Lanier Prude, MD;  Location: [facility] INVASIVE CV LAB;  Service: Cardiovascular;  Laterality: N/A;   LEAD EXTRACTION N/A 04/14/2022   Procedure: LEAD EXTRACTION;  Surgeon: Lanier Prude, MD;  Location: [facility] INVASIVE CV LAB;  Service: Cardiovascular;  Laterality: N/A;   LEFT HEART CATH AND CORONARY ANGIOGRAPHY N/A 12/18/2017   Procedure: LEFT HEART CATH AND CORONARY ANGIOGRAPHY;  Surgeon: [name], MD;  Location: [facility] INVASIVE CV LAB;   Service: Cardiovascular;  Laterality: N/A;   PVC ABLATION N/A 04/02/2017   Procedure: PVC ABLATION;  Surgeon: Regan Lemming, MD;  Location: [facility] INVASIVE CV LAB;  Service: Cardiovascular;  Laterality: N/A;   TEE WITHOUT CARDIOVERSION N/A 10/08/2016   Procedure: TRANSESOPHAGEAL ECHOCARDIOGRAM (TEE);  Surgeon: [name], MD;  Location: [facility] ENDOSCOPY;  Service: Cardiovascular;  Laterality: N/A;   THYROIDECTOMY       A IV Location/Drains/Wounds Patient Lines/Drains/Airways Status     Active Line/Drains/Airways     Name Placement date Placement time Site Days   Peripheral IV 01/17/23 20 G Anterior;Left;Proximal Forearm 01/17/23  0048  Forearm  less than 1            Intake/Output Last 24 hours  Intake/Output Summary (Last 24 hours) at 01/17/2023 0909 Last data filed at 01/17/2023 0310 Gross per 24 hour  Intake 253 ml  Output 960 ml  Net -707 ml    Labs/Imaging Results for orders placed or performed during the hospital encounter of 01/16/23 (from the past 48 hour(s))  Basic metabolic panel     Status: Abnormal   Collection Time: 01/16/23  4:35 PM  Result Value Ref Range   Sodium 138 135 - 145 mmol/L   Potassium 4.9 3.5 - 5.1 mmol/L  Chloride 105 98 - 111 mmol/L   CO2 24 22 - 32 mmol/L   Glucose, Bld 114 (H) 70 - 99 mg/dL    Comment: Glucose reference range applies only to samples taken after fasting for at least 8 hours.   BUN 27 (H) 6 - 20 mg/dL   Creatinine, Ser 0.98 (H) 0.44 - 1.00 mg/dL   Calcium 8.9 8.9 - 11.9 mg/dL   GFR, Estimated 45 (L) >60 mL/min    Comment: (NOTE) Calculated using the CKD-EPI Creatinine Equation (2021)    Anion gap 9 5 - 15    Comment: Performed at [facility], [address] [place], [place] [address]  CBC     Status: Abnormal   Collection Time: 01/16/23  4:35 PM  Result Value Ref Range   WBC 6.5 4.0 - 10.5 K/uL   RBC 4.10 3.87 - 5.11 MIL/uL   Hemoglobin 11.8 (L) 12.0 - 15.0 g/dL   HCT 95.6 21.3 - 08.6 %   MCV 91.5  80.0 - 100.0 fL   MCH 28.8 26.0 - 34.0 pg   MCHC 31.5 30.0 - 36.0 g/dL   RDW 57.8 46.9 - 62.9 %   Platelets 189 150 - 400 K/uL   nRBC 0.0 0.0 - 0.2 %    Comment: Performed at [facility], [address], [place], [place] [address]  Troponin I (High Sensitivity)     Status: None   Collection Time: 01/16/23  4:35 PM  Result Value Ref Range   Troponin I (High Sensitivity) 7 <18 ng/L    Comment: (NOTE) Elevated high sensitivity troponin I (hsTnI) values and significant  changes across serial measurements may suggest ACS but many other  chronic and acute conditions are known to elevate hsTnI results.  Refer to the "Links" section for chest pain algorithms and additional  guidance. Performed at [facility], [address], [place], [place] [address]   Brain natriuretic peptide     Status: Abnormal   Collection Time: 01/16/23  4:42 PM  Result Value Ref Range   B Natriuretic Peptide 1,043.4 (H) 0.0 - 100.0 pg/mL    Comment: Performed at [facility], [address] [place], [place] [address]  Troponin I (High Sensitivity)     Status: None   Collection Time: 01/16/23  8:30 PM  Result Value Ref Range   Troponin I (High Sensitivity) 7 <18 ng/L    Comment: (NOTE) Elevated high sensitivity troponin I (hsTnI) values and significant  changes across serial measurements may suggest ACS but many other  chronic and acute conditions are known to elevate hsTnI results.  Refer to the "Links" section for chest pain algorithms and additional  guidance. Performed at [facility], [address], [place], [place] [address]   Basic metabolic panel     Status: Abnormal   Collection Time: 01/17/23  4:55 AM  Result Value Ref Range   Sodium 138 135 - 145 mmol/L   Potassium 4.1 3.5 - 5.1 mmol/L   Chloride 104 98 - 111 mmol/L   CO2 24 22 - 32 mmol/L   Glucose, Bld 101 (H) 70 - 99 mg/dL    Comment: Glucose reference range applies only to samples taken after fasting for at least  8 hours.   BUN 22 (H) 6 - 20 mg/dL   Creatinine, Ser 4.03 (H) 0.44 - 1.00 mg/dL   Calcium 9.0 8.9 - 47.4 mg/dL   GFR, Estimated 56 (L) >60  mL/min    Comment: (NOTE) Calculated using the CKD-EPI Creatinine Equation (2021)    Anion gap 10 5 - 15    Comment: Performed at [facility], [address], [place], [place] [address]  CBC     Status: None   Collection Time: 01/17/23  4:55 AM  Result Value Ref Range   WBC 7.0 4.0 - 10.5 K/uL   RBC 4.37 3.87 - 5.11 MIL/uL   Hemoglobin 12.7 12.0 - 15.0 g/dL   HCT 60.4 54.0 - 98.1 %   MCV 89.9 80.0 - 100.0 fL   MCH 29.1 26.0 - 34.0 pg   MCHC 32.3 30.0 - 36.0 g/dL   RDW 19.1 47.8 - 29.5 %   Platelets 161 150 - 400 K/uL   nRBC 0.0 0.0 - 0.2 %    Comment: Performed at [facility], [address] [place], [place] [address]  Magnesium     Status: None   Collection Time: 01/17/23  4:55 AM  Result Value Ref Range   Magnesium 2.1 1.7 - 2.4 mg/dL    Comment: Performed at [facility], [address] [place], [place] [address]  Phosphorus     Status: None   Collection Time: 01/17/23  4:55 AM  Result Value Ref Range   Phosphorus 3.6 2.5 - 4.6 mg/dL    Comment: Performed at [facility], [address] [place], [place] [address]  Brain natriuretic peptide     Status: Abnormal   Collection Time: 01/17/23  4:55 AM  Result Value Ref Range   B Natriuretic Peptide 1,308.6 (H) 0.0 - 100.0 pg/mL    Comment: Performed at [facility], [address] [place], [place] [address]  TSH     Status: Abnormal   Collection Time: 01/17/23  4:55 AM  Result Value Ref Range   TSH 0.081 (L) 0.350 - 4.500 uIU/mL    Comment: Performed by a 3rd Generation assay with a functional sensitivity of <=0.01 uIU/mL. Performed at [facility], [address] [place], [place] [address]   T4, free     Status: None   Collection Time: 01/17/23  4:55 AM  Result Value Ref Range   Free T4 1.04 0.61 - 1.12 ng/dL    Comment: (NOTE) Biotin  ingestion may interfere with free T4 tests. If the results are inconsistent with the TSH level, previous test results, or the clinical presentation, then consider biotin interference. If needed, order repeat testing after stopping biotin. Performed at [facility], [address] [place], [place] [address]    CT Head Wo Contrast  Result Date: 01/16/2023 CLINICAL DATA:  Dizziness and weakness for a week worsening today EXAM: CT HEAD WITHOUT CONTRAST TECHNIQUE: Contiguous axial images were obtained from the base of the skull through the vertex without intravenous contrast. RADIATION DOSE REDUCTION: This exam was performed according to the departmental dose-optimization program which includes automated exposure control, adjustment of the mA and/or kV according to patient size and/or use of iterative reconstruction technique. COMPARISON:  None Available. FINDINGS: Brain: No intracranial hemorrhage, mass effect, or evidence of acute infarct. No hydrocephalus. No extra-axial fluid collection. Vascular: No hyperdense vessel or unexpected calcification. Skull: No fracture or focal lesion. Sinuses/Orbits: No acute finding. Other: None. IMPRESSION: No acute intracranial abnormality. Electronically Signed   By: Minerva Fester M.D.   On: 01/16/2023 19:02   DG Chest 2 View  Result Date: 01/16/2023 CLINICAL DATA:  Chest  pain. EXAM: CHEST - 2 VIEW COMPARISON:  04/15/2022. FINDINGS: Bilateral lung fields are clear. Bilateral costophrenic angles are clear. Normal cardio-mediastinal silhouette.  There is a left-sided ICD. No acute osseous abnormalities. The soft tissues are within normal limits. IMPRESSION: *No active cardiopulmonary disease. Electronically Signed   By: Jules Schick M.D.   On: 01/16/2023 17:41    Pending Labs Unresulted Labs (From admission, onward)     Start     Ordered   01/17/23 0604  T3  Add-on,   AD        01/17/23 0603   01/17/23 0500  HIV Antibody (routine testing w rflx)   (HIV Antibody (Routine testing w reflex) panel)  Tomorrow morning,   R        01/17/23 0140            Vitals/Pain Today's Vitals   01/17/23 0602 01/17/23 0730 01/17/23 0745 01/17/23 0800  BP: (!) 89/63   106/83  Pulse: (!) 48 (!) 55 68 75  Resp: (!) 30 15 13  (!) 22  Temp: 99.5 F (37.5 C)     TempSrc: Oral     SpO2: 97% 99% 99% 98%  Weight:      Height:      PainSc:        Isolation Precautions No active isolations  Medications Medications  sodium chloride flush (NS) 0.9 % injection 3 mL (3 mLs Intravenous Given 01/17/23 0158)  acetaminophen (TYLENOL) tablet 1,000 mg (has no administration in time range)  rosuvastatin (CRESTOR) tablet 20 mg (has no administration in time range)  sotalol (BETAPACE) tablet 160 mg (has no administration in time range)    And  sotalol (BETAPACE) tablet 80 mg (has no administration in time range)  buPROPion (WELLBUTRIN XL) 24 hr tablet 150 mg (has no administration in time range)  escitalopram (LEXAPRO) tablet 10 mg (has no administration in time range)  traZODone (DESYREL) tablet 25 mg (has no administration in time range)  pantoprazole (PROTONIX) EC tablet 40 mg (has no administration in time range)  apixaban (ELIQUIS) tablet 5 mg (has no administration in time range)  ferrous sulfate tablet 325 mg (has no administration in time range)  furosemide (LASIX) injection 40 mg (40 mg Intravenous Given 01/17/23 0057)    Mobility walks     Focused Assessments Cardiac Assessment Handoff:  Cardiac Rhythm: Atrial fibrillation Lab Results  Component Value Date   TROPONINI <0.03 12/18/2017   No results found for: "DDIMER" Does the Patient currently have chest pain? No    R Recommendations: See Admitting Provider Note  Report given to:   Additional Notes:

## 2023-01-17 NOTE — ED Notes (Signed)
CCMD contacted about pt being placed on cardiac monitoring.

## 2023-01-17 NOTE — Assessment & Plan Note (Signed)
Continue with levothyroxine  

## 2023-01-17 NOTE — Assessment & Plan Note (Signed)
Stable cell counts.

## 2023-01-17 NOTE — Assessment & Plan Note (Signed)
No chest pain, no signs of acute coronary syndrome.   Dyslipidemia, continue with statin therapy.

## 2023-01-17 NOTE — Evaluation (Signed)
Physical Therapy Brief Evaluation and Discharge Note Patient Details Name: Catherine Mays MRN: [id number] DOB: 1965-12-17 Today's Date: 01/17/2023   History of Present Illness  57 yo female admitted 11/22 with chest tightness, dizziness, Afib. PMhx: CHF, PAF, CAD, ICM, ICD placement, mitral regurgitation, PVC  Clinical Impression  Pt very pleasant ,states she enjoys gardening, hiking, camping and has had increased feelings of "jelly legs" with activity. Pt with resting HR 98-115 with rapid rise in static standing to 122-154 with pt stating feeling clammy and tightness, unable to get accurate HR with gait as pt not on tele box for mobility with pt demonstrating independence with all mobility. Pt aware of rapid HR and no further therapy needs with pt educated for need to have HR monitored with activity at this time and in agreement of no further services.   Supine 117/84 (89), HR 109 Sitting 125/108 (91), HR 125 Standing 93/69 (78), HR 134     PT Assessment Patient does not need any further PT services  Assistance Needed at Discharge  None    Equipment Recommendations None recommended by PT  Recommendations for Other Services       Precautions/Restrictions Precautions Precautions: Other (comment) Precaution Comments: watch HR        Mobility  Bed Mobility   Supine/Sidelying to sit: Independent Sit to supine/sidelying: Independent    Transfers Overall transfer level: Independent                      Ambulation/Gait Ambulation/Gait assistance: Independent Gait Distance (Feet): 300 Feet Assistive device: None Gait Pattern/deviations: WFL(Within Functional Limits) Gait Speed: Pace WFL    Home Activity Instructions    Stairs            Modified Rankin (Stroke Patients Only)        Balance Overall balance assessment: No apparent balance deficits (not formally assessed)                        Pertinent Vitals/Pain PT - Brief Vital  Signs All Vital Signs Stable: Other (comment) (HR 115-155 with static standing) Pain Assessment Pain Assessment: No/denies pain     Home Living Family/patient expects to be discharged to:: Private residence Living Arrangements: Spouse/significant other;Other relatives Available Help at Discharge: Family;Available 24 hours/day Home Environment: Stairs to enter  [employer] of Steps: 3 Home Equipment: None        Prior Function Level of Independence: Independent      UE/LE Assessment   UE ROM/Strength/Tone/Coordination: WFL    LE ROM/Strength/Tone/Coordination: WFL (5/5 LB strengh other than bil hip flexion 4/5)      Communication   Communication Communication: No apparent difficulties     Cognition Overall Cognitive Status: Appears within functional limits for tasks assessed/performed       General Comments      Exercises     Assessment/Plan    PT Problem List         PT Visit Diagnosis Other abnormalities of gait and mobility (R26.89)    No Skilled PT All education completed   Co-evaluation                AMPAC 6 Clicks Help needed turning from your back to your side while in a flat bed without using bedrails?: None Help needed moving from lying on your back to sitting on the side of a flat bed without using bedrails?: None Help needed moving to and from a bed to  a chair (including a wheelchair)?: None Help needed standing up from a chair using your arms (e.g., wheelchair or bedside chair)?: None Help needed to walk in hospital room?: None Help needed climbing 3-5 steps with a railing? : None 6 Click Score: 24      End of Session   Activity Tolerance: Patient tolerated treatment well Patient left: in bed;with call bell/phone within reach Nurse Communication: Mobility status PT Visit Diagnosis: Other abnormalities of gait and mobility (R26.89)     Time: 4696-2952 PT Time Calculation (min) (ACUTE ONLY): 19 min  Charges:   PT  Evaluation $PT Eval Low Complexity: 1 Low      Zakiya Sporrer P, PT Acute Rehabilitation Services Office: [phone]   Cristine Polio  01/17/2023, 9:13 AM

## 2023-01-17 NOTE — Plan of Care (Signed)
  Problem: Education: Goal: Knowledge of General Education information will improve Description: Including pain rating scale, medication(s)/side effects and non-pharmacologic comfort measures Outcome: Progressing   Problem: Clinical Measurements: Goal: Ability to maintain clinical measurements within normal limits will improve Outcome: Progressing Goal: Will remain free from infection Outcome: Progressing Goal: Diagnostic test results will improve Outcome: Progressing Goal: Cardiovascular complication will be avoided Outcome: Progressing   Problem: Activity: Goal: Risk for activity intolerance will decrease Outcome: Progressing   Problem: Nutrition: Goal: Adequate nutrition will be maintained Outcome: Progressing   Problem: Pain Management: Goal: General experience of comfort will improve Outcome: Progressing

## 2023-01-17 NOTE — Assessment & Plan Note (Addendum)
Patient has converted to sinus rhythm spontaneously.  Plan to hold on sotalol and start patient on Tykosin next week as inpatient.  Continue anticoagulation with apixaban.

## 2023-01-18 ENCOUNTER — Encounter (HOSPITAL_COMMUNITY): Payer: Self-pay | Admitting: Anesthesiology

## 2023-01-18 DIAGNOSIS — I480 Paroxysmal atrial fibrillation: Secondary | ICD-10-CM | POA: Diagnosis not present

## 2023-01-18 DIAGNOSIS — I5022 Chronic systolic (congestive) heart failure: Secondary | ICD-10-CM | POA: Diagnosis not present

## 2023-01-18 DIAGNOSIS — E039 Hypothyroidism, unspecified: Secondary | ICD-10-CM | POA: Diagnosis not present

## 2023-01-18 DIAGNOSIS — I2510 Atherosclerotic heart disease of native coronary artery without angina pectoris: Secondary | ICD-10-CM | POA: Diagnosis not present

## 2023-01-18 DIAGNOSIS — F32A Depression, unspecified: Secondary | ICD-10-CM

## 2023-01-18 LAB — BASIC METABOLIC PANEL
Anion gap: 11 (ref 5–15)
BUN: 23 mg/dL — ABNORMAL HIGH (ref 6–20)
CO2: 25 mmol/L (ref 22–32)
Calcium: 8.8 mg/dL — ABNORMAL LOW (ref 8.9–10.3)
Chloride: 101 mmol/L (ref 98–111)
Creatinine, Ser: 1.14 mg/dL — ABNORMAL HIGH (ref 0.44–1.00)
GFR, Estimated: 56 mL/min — ABNORMAL LOW (ref >60)
Glucose, Bld: 91 mg/dL (ref 70–99)
Potassium: 3.7 mmol/L (ref 3.5–5.1)
Sodium: 137 mmol/L (ref 135–145)

## 2023-01-18 LAB — MAGNESIUM: Magnesium: 2.1 mg/dL (ref 1.7–2.4)

## 2023-01-18 MED ORDER — SPIRONOLACTONE 12.5 MG HALF TABLET
12.5000 mg | ORAL_TABLET | Freq: Every day | ORAL | Status: DC
  Administered 2023-01-18 – 2023-01-19 (×2): 12.5 mg via ORAL
  Filled 2023-01-18 (×2): qty 1

## 2023-01-18 NOTE — Progress Notes (Addendum)
Progress Note   Patient: Mio Placide NUU:[id number] DOB: May 02, 1965 DOA: 01/16/2023     1 DOS: the patient was seen and examined on 01/18/2023   Brief hospital course: Mrs. Coltharp was admitted to the hospital with the working of hypotension.   57 yo female with the past medical history of coronary artery disease, heart failure, atrial fibrillation, hyperlipidemia, CKD and hypothyroid who presented with dyspnea. Patient reported one to two weeks of dyspnea and orthopnea, worsening symptoms over last 2 days, along with chest pressure. Her blood pressure has been low at home with systolic in the 70's.  On her initial physical examination her blood pressure was 97/76, HR 85, RR 13 and 02 saturation 98%, lungs with no wheezing or rales, heart with S1 and S2 present and regular, positive systolic murmur, abdomen with no distention and no lower extremity edema.  Chest radiograph with no cardiomegaly, no effusions or infiltrates, mild bilateral hilar vascular congestion, defibrillator in place with one right ventricular lead.   EKG 98 bpm, left axis deviation, qtc 505, left bundle branch block, ventricular paced rhythm vs 1st degree AV block with PAC vs underlying atrial fibrillation with no significant ST segment or T wave changes.   11/24 patient with persistent hypotension, and atrial fibrillation. Plan for possible direct current cardioversion, possible upgrade CRT device/ AF ablation.   Assessment and Plan: * Chronic systolic CHF (congestive heart failure) (HCC) Echocardiogram with reduced LV systolic function with LV 25 to 30%, anterior, apical, inferoapical and anterospetal walls are thinned and akinetic.  Mild LV cavity dilatation, RV systolic function with mild reduction, RVSP 28,8 mmHg, LA with moderate dilatation, left atrial size dilatation moderate.   Volume status seems to be stable today.  She received IV furosemide on admission.  11/23 Positive orthostatics  Supine 117/84 (89),  HR 109 Sitting 125/108 (91), HR 125 Standing 93/69 (78), HR 134  Plan to hold on further diuresis..  Continue with sotalol.   Continue to hold on entresto for now due to risk of hypotension.  Will resume lower dose spironolactone today 12.5 mg.   CAD in native artery No chest pain, no signs of acute coronary syndrome.   Dyslipidemia, continue with statin therapy.   PAF (paroxysmal atrial fibrillation) (HCC) Continue sotalol, this should not be held for hypotension.  Plan for possible direct current cardioversion, upgrade CRT/ AF ablation.  Anticoagulation with apixaban.   Hypothyroidism Continue with levothyroxine.   IDA (iron deficiency anemia) Stable cell counts.  Continue oral iron supplementation.   Depression Continue with bupropion and escitalopram.       Subjective: Patient is feeling well this morning, with no chest pain or palpitations, no orthopnea or PND.   Physical Exam: Vitals:   01/18/23 0838 01/18/23 0900 01/18/23 0922 01/18/23 1000  BP: (!) 86/70 (!) 87/63 (!) 86/68 95/66  Pulse: 95 83 91   Resp: 18 16 20    Temp: 98.1 F (36.7 C)     TempSrc: Oral     SpO2: 96% 97% 96%   Weight:      Height:       Neurology awake and alert ENT with mild pallor with icterus Cardiovascular with S1 and S2 present, irregularly irregular with no gallops, rubs or murmurs No JVD No lower extremity edema Respiratory with no rales or wheezing Abdomen with no distention  Data Reviewed:    Family Communication: no family at the bedside   Disposition: Status is: Inpatient Remains inpatient appropriate because: possible inpatient cardioversion  Planned Discharge Destination: Home     Author: Coralie Keens, MD 01/18/2023 11:50 AM  For on call review [email or web address].

## 2023-01-18 NOTE — Assessment & Plan Note (Signed)
Continue with bupropion and escitalopram.

## 2023-01-18 NOTE — Anesthesia Preprocedure Evaluation (Addendum)
Anesthesia Evaluation  Patient identified by MRN, date of birth, ID band Patient awake    Reviewed: Allergy & Precautions, NPO status , Patient's Chart, lab work & pertinent test results, reviewed documented beta blocker date and time   Airway        Dental   Pulmonary neg pulmonary ROS          Cardiovascular hypertension, Pt. on medications and Pt. on home beta blockers + CAD, + Past MI, + Cardiac Stents and +CHF  + dysrhythmias (eliquis) Atrial Fibrillation + pacemaker + Cardiac Defibrillator  Rhythm:Irregular  TTE 2024 1. The anterior, apical, inferoapical and anteroseptal walls are thinned  and akinetic. No LV thrombus by Definity contrast. Left ventricular  ejection fraction, by estimation, is 25 to 30%. The left ventricle has  severely decreased function. The left  ventricle demonstrates regional wall motion abnormalities (see scoring  diagram/findings for description). The left ventricular internal cavity  size was mildly dilated. Left ventricular diastolic parameters are  consistent with Grade I diastolic  dysfunction (impaired relaxation).   2. Right ventricular systolic function is mildly reduced. The right  ventricular size is normal. There is normal pulmonary artery systolic  pressure. The estimated right ventricular systolic pressure is 28.8 mmHg.   3. Left atrial size was moderately dilated.   4. The mitral valve is abnormal. Mild mitral valve regurgitation.   5. The aortic valve is tricuspid. Aortic valve regurgitation is not  visualized.   6. The inferior vena cava is normal in size with greater than 50%  respiratory variability, suggesting right atrial pressure of 3 mmHg.     Neuro/Psych  PSYCHIATRIC DISORDERS  Depression    negative neurological ROS     GI/Hepatic Neg liver ROS,GERD  ,,  Endo/Other  Hypothyroidism    Renal/GU Renal InsufficiencyRenal disease  negative genitourinary    Musculoskeletal negative musculoskeletal ROS (+)    Abdominal   Peds  Hematology  (+) Blood dyscrasia, anemia Eliquis therapy   Anesthesia Other Findings   Reproductive/Obstetrics                             Anesthesia Physical Anesthesia Plan  ASA: 4  Anesthesia Plan: General   Post-op Pain Management:    Induction: Intravenous  PONV Risk Score and Plan: 3 and Propofol infusion and Treatment may vary due to age or medical condition  Airway Management Planned: Natural Airway and Mask  Additional Equipment: None  Intra-op Plan:   Post-operative Plan:   Informed Consent: I have reviewed the patients History and Physical, chart, labs and discussed the procedure including the risks, benefits and alternatives for the proposed anesthesia with the patient or authorized representative who has indicated his/her understanding and acceptance.     Dental advisory given  Plan Discussed with: CRNA and Anesthesiologist  Anesthesia Plan Comments:        Anesthesia Quick Evaluation

## 2023-01-18 NOTE — Progress Notes (Signed)
Contacted on call, Rathore, MD in reference to patients low blood pressure and advised patient is asymptomatic. MD advised to get a manual blood pressure and monitor.

## 2023-01-18 NOTE — Progress Notes (Addendum)
Electrophysiology Progress Note  Patient Name: Catherine Mays Date of Encounter: 01/18/2023  Primary Cardiologist: Dr. Shirlee Latch Electrophysiologist: Dr. Lalla Brothers   Subjective   Feels well currently. Has chronic dizziness that is at baseline. Headache and nausea present on admission have improved.  Sotalol was unfortunately held last night due to BP, reportedly.  Inpatient Medications    Scheduled Meds:  apixaban  5 mg Oral BID   buPROPion  150 mg Oral Daily   escitalopram  10 mg Oral Daily   ferrous sulfate  325 mg Oral Daily   pantoprazole  40 mg Oral Daily   rosuvastatin  20 mg Oral Daily   sodium chloride flush  3 mL Intravenous Q12H   sotalol  160 mg Oral q morning   And   sotalol  80 mg Oral QPM   traZODone  25 mg Oral QHS   Continuous Infusions:  PRN Meds: acetaminophen   Vital Signs    Vitals:   01/18/23 0700 01/18/23 0838 01/18/23 0900 01/18/23 0922  BP: 101/72 (!) 86/70 (!) 87/63 (!) 86/68  Pulse: 86 95 83 91  Resp: 11 18 16 20   Temp:  98.1 F (36.7 C)    TempSrc:  Oral    SpO2: 100% 96% 97% 96%  Weight:      Height:        Intake/Output Summary (Last 24 hours) at 01/18/2023 1032 Last data filed at 01/17/2023 2005 Gross per 24 hour  Intake 240 ml  Output --  Net 240 ml   Filed Weights   01/17/23 0502 01/18/23 0500  Weight: 69.9 kg 68 kg    Physical Exam    GEN- The patient is well appearing, alert and oriented x 3 today.   Lungs- Clear to ausculation bilaterally, normal work of breathing Heart- Regular rate and rhythm, no murmurs, rubs or gallops Extremities- no clubbing, cyanosis, or edema Neuro- strength and sensation are intact  Labs      Telemetry    AF with BBB; rates controlled (70's-80's) at rest (personally reviewed)  Radiology    CT Head Wo Contrast  Result Date: 01/16/2023 CLINICAL DATA:  Dizziness and weakness for a week worsening today EXAM: CT HEAD WITHOUT CONTRAST TECHNIQUE: Contiguous axial images were  obtained from the base of the skull through the vertex without intravenous contrast. RADIATION DOSE REDUCTION: This exam was performed according to the departmental dose-optimization program which includes automated exposure control, adjustment of the mA and/or kV according to patient size and/or use of iterative reconstruction technique. COMPARISON:  None Available. FINDINGS: Brain: No intracranial hemorrhage, mass effect, or evidence of acute infarct. No hydrocephalus. No extra-axial fluid collection. Vascular: No hyperdense vessel or unexpected calcification. Skull: No fracture or focal lesion. Sinuses/Orbits: No acute finding. Other: None. IMPRESSION: No acute intracranial abnormality. Electronically Signed   By: Minerva Fester M.D.   On: 01/16/2023 19:02   DG Chest 2 View  Result Date: 01/16/2023 CLINICAL DATA:  Chest pain. EXAM: CHEST - 2 VIEW COMPARISON:  04/15/2022. FINDINGS: Bilateral lung fields are clear. Bilateral costophrenic angles are clear. Normal cardio-mediastinal silhouette.  There is a left-sided ICD. No acute osseous abnormalities. The soft tissues are within normal limits. IMPRESSION: *No active cardiopulmonary disease. Electronically Signed   By: Jules Schick M.D.   On: 01/16/2023 17:41     Patient Profile     Catherine Mays is a 57 y.o. female with a past medical history significant for CAD s/p MI, CHFrEF, PVCs (managed with sotalol), AF, recent  progression of .      Assessment & Plan     Atrial fibrillation recurrence on sotalol which prescribed for PVCs Did not tolerate amiodarone in the past Continue sotalol, plan for DCCV this admission Possibly attributable to exacerbation from progression to bundle branch block We briefly discussed ablation for AF though it may be reasonable to place CRT device and monitor for AF recurrence   CHFrEF LVEF 25-30% Ischemic cardiomyopathy with history of STEMI Single chamber ICD in place - malfunctioning lead extracted in  03/2022 Discussing CCM therapy with Dr. Lalla Brothers   Nonspecific IVCD With QRS > 160 msec -- appears more LBBBish on 11/20/22 ECG Will need to discuss CRT upgrade as outpatient   History of PVCs Underwent PVC ablation with improvement in PVC burden (originally 41%) Subsequently placed on sotalol Having more PVCs on telemetry presently after sotalol was held last night Do not hold sotalol   This is a complex situation. I see two present needs - restoration of sinus rhythm and upgrade to a CRT device. The former requires uninterrupted anticoagulation and the latter temporary cessation of anticoagulation. She will not much benefit from CRT with her AF as the current rates. I think she will benefit from AF ablation.   Will discuss with the EP team tomorrow the arrangement and timing of procedures as much will depend upon lab availability. I am choosing not to hold eliquis tonight in case the decision is made to proceed with DCCV. Will delay tomorrow AM's dose until a decision is made regarding plan of action.  She is young for amiodarone but has needed antiarrhythmic medication for management of PVCs -- currently achieved with sotalol.   York Pellant MD 01/18/2023 10:32 AM    For questions or updates, please contact [facility] Please consult [email or web address] for contact info under Cardiology/STEMI.  Signed, Maurice Small, MD  01/18/2023, 10:32 AM

## 2023-01-19 ENCOUNTER — Encounter (HOSPITAL_COMMUNITY): Payer: Self-pay | Admitting: Internal Medicine

## 2023-01-19 ENCOUNTER — Other Ambulatory Visit (HOSPITAL_COMMUNITY): Payer: Self-pay

## 2023-01-19 ENCOUNTER — Encounter (HOSPITAL_COMMUNITY): Payer: Self-pay | Admitting: Hematology

## 2023-01-19 DIAGNOSIS — I5022 Chronic systolic (congestive) heart failure: Secondary | ICD-10-CM | POA: Diagnosis not present

## 2023-01-19 DIAGNOSIS — I480 Paroxysmal atrial fibrillation: Secondary | ICD-10-CM | POA: Diagnosis not present

## 2023-01-19 DIAGNOSIS — F32A Depression, unspecified: Secondary | ICD-10-CM

## 2023-01-19 DIAGNOSIS — N1831 Chronic kidney disease, stage 3a: Secondary | ICD-10-CM

## 2023-01-19 DIAGNOSIS — I2510 Atherosclerotic heart disease of native coronary artery without angina pectoris: Secondary | ICD-10-CM | POA: Diagnosis not present

## 2023-01-19 DIAGNOSIS — E039 Hypothyroidism, unspecified: Secondary | ICD-10-CM | POA: Diagnosis not present

## 2023-01-19 LAB — BASIC METABOLIC PANEL
Anion gap: 6 (ref 5–15)
BUN: 25 mg/dL — ABNORMAL HIGH (ref 6–20)
CO2: 27 mmol/L (ref 22–32)
Calcium: 9 mg/dL (ref 8.9–10.3)
Chloride: 102 mmol/L (ref 98–111)
Creatinine, Ser: 1.31 mg/dL — ABNORMAL HIGH (ref 0.44–1.00)
GFR, Estimated: 48 mL/min — ABNORMAL LOW (ref >60)
Glucose, Bld: 98 mg/dL (ref 70–99)
Potassium: 4 mmol/L (ref 3.5–5.1)
Sodium: 135 mmol/L (ref 135–145)

## 2023-01-19 SURGERY — CARDIOVERSION (CATH LAB)
Anesthesia: General

## 2023-01-19 MED ORDER — SERTRALINE HCL 50 MG PO TABS: 25.0000 mg | ORAL_TABLET | Freq: Every day | ORAL | Status: DC

## 2023-01-19 MED ORDER — SERTRALINE HCL 25 MG PO TABS
25.0000 mg | ORAL_TABLET | Freq: Every day | ORAL | 0 refills | Status: DC
  Filled 2023-01-19: qty 30, 30d supply, fill #0

## 2023-01-19 MED ORDER — FUROSEMIDE 20 MG PO TABS
ORAL_TABLET | ORAL | 3 refills | Status: DC
  Filled 2023-01-19: qty 90, 30d supply, fill #0

## 2023-01-19 MED ORDER — SODIUM CHLORIDE 0.9% FLUSH: 10.0000 mL | Freq: Two times a day (BID) | INTRAVENOUS | Status: DC

## 2023-01-19 NOTE — Plan of Care (Signed)
?  Problem: Clinical Measurements: ?Goal: Will remain free from infection ?Outcome: Progressing ?Goal: Diagnostic test results will improve ?Outcome: Progressing ?Goal: Respiratory complications will improve ?Outcome: Progressing ?  ?

## 2023-01-19 NOTE — Progress Notes (Addendum)
  Patient Name: Catherine Mays Date of Encounter: 01/19/2023  Primary Cardiologist: Verne Carrow, MD Electrophysiologist: Regan Lemming, MD  Interval Summary   The patient is doing well today.  At this time, the patient denies chest pain, shortness of breath, or any new concerns.  Vital Signs    Vitals:   01/18/23 2226 01/19/23 0027 01/19/23 0400 01/19/23 0509  BP: 104/76 99/71    Pulse:  60    Resp: 14 15  12   Temp:  97.8 F (36.6 C) 98.1 F (36.7 C)   TempSrc:  Oral Oral   SpO2:  98%    Weight:    68.7 kg  Height:       No intake or output data in the 24 hours ending 01/19/23 0737 Filed Weights   01/17/23 0502 01/18/23 0500 01/19/23 0509  Weight: 69.9 kg 68 kg 68.7 kg    Physical Exam    GEN- The patient is well appearing, alert and oriented x 3 today.   Lungs- Clear to ausculation bilaterally, normal work of breathing Cardiac- Regular rate and rhythm, no murmurs, rubs or gallops GI- soft, NT, ND, + BS Extremities- no clubbing or cyanosis. No edema  Telemetry    SR 60's (personally reviewed)  Hospital Course    Catherine Mays is a 57 y.o. female with a past medical history significant for CAD s/p MI, CHFrEF, PVCs (managed with sotalol), AF and recent progression of QRS widening, RBBB morphology. Admitted 11/23 with chest tightness, dizziness, nausea and hypotension. Sotalol stopped 11/25 with plans for Tikosyn. Pending DCCV.   Assessment & Plan    Atrial Fibrillation -stop sotalol -close follow up with AF Clinic arranged for planning of Tikosyn admission  -continue eliquis for stroke prophylaxis, CHA2DS2-VASc 3 -pending DCCV, will need uninterrupted OAC for 4 weeks post cardioversion -follow up with Dr. Lalla Brothers in January 2025 in place to discuss ablation  -reviewed with his office RN, assessing for spot in Feb/March for ablation  -intolerant of amiodarone in past due to GI side effects -soft blood pressures preclude adding other agents for  rate control at this time -stop lexapro with anticipation of Tikosyn, change to zoloft given wellbutrin in place as well (other option would be cymbalta)  CHFrEF s/p ICD  LVEF 25-30%. Ischemic cardiomyopathy with history of STEMI. Single chamber ICD in place - malfunctioning lead extracted in 03/2022 -plan for follow up with Dr. Lalla Brothers in Jan 2025 for discussion of upgrade of device, though with her RBBB physiology she may not be the best candidate for CRT, ablation as first steps for AF   Nonspecific IVCD -further discussion as above   History of PVCs PVC ablation with improvement in PVC burden (originally 41%) and subsequently placed on sotalol.  -stop sotalol in anticipation for Tikosyn admission      For questions or updates, please contact [facility] Please consult [email or web address] for contact info under Cardiology/STEMI.  Signed, Canary Brim, MSN, APRN, NP-C, AGACNP-BC [facility] - Electrophysiology  01/19/2023, 9:44 AM

## 2023-01-19 NOTE — Progress Notes (Signed)
EKG reviewed on paper, did not transmit, SR.    Reviewed medication changes with patient, AF Clinic visit 11/27 with patient at bedside.     Canary Brim, MSN, APRN, NP-C, AGACNP-BC [facility] - Electrophysiology  01/19/2023, 11:37 AM

## 2023-01-19 NOTE — Discharge Summary (Addendum)
Physician Discharge Summary   Patient: Catherine Mays MRN: [id number] DOB: 1965-03-02  Admit date:     01/16/2023  Discharge date: 01/19/23  Discharge Physician: York Ram Ivyrose Hashman   PCP: Benita Stabile, MD   Recommendations at discharge:    Sotalol has been discontinued with the intention to start patient on Dofetilide (Tykosin) next week as inpatient.  Change escitalopram to sertraline to avoid drug drug interaction.  Continue holding entresto due to hypotension.  Change furosemide to as needed in case of volume overload, weight gain 2 to 3 lbs in 24 hrs or 5 lbs in 7 days.  Follow up with Dr Margo Aye in 7 to 10 days. Follow up with atrial fibrillation clinic next week as scheduled.   Discharge Diagnoses: Principal Problem:   Chronic systolic CHF (congestive heart failure) (HCC) Active Problems:   CAD in native artery   PAF (paroxysmal atrial fibrillation) (HCC)   Hypothyroidism   IDA (iron deficiency anemia)   Depression  Resolved Problems:   * No resolved hospital problems. [facility] Hospital Course: [name] was admitted to the hospital with the working of hypotension.   57 yo female with the past medical history of coronary artery disease, heart failure, atrial fibrillation, hyperlipidemia, CKD and hypothyroid who presented with dyspnea. Patient reported one to two weeks of dyspnea and orthopnea, worsening symptoms over last 2 days, along with chest pressure. Her blood pressure has been low at home with systolic in the 70's.  On her initial physical examination her blood pressure was 97/76, HR 85, RR 13 and 02 saturation 98%, lungs with no wheezing or rales, heart with S1 and S2 present and regular, positive systolic murmur, abdomen with no distention and no lower extremity edema.  Chest radiograph with no cardiomegaly, no effusions or infiltrates, mild bilateral hilar vascular congestion, defibrillator in place with one right ventricular lead.   EKG 98 bpm, left axis  deviation, qtc 505, left bundle branch block, ventricular paced rhythm vs 1st degree AV block with PAC vs underlying atrial fibrillation with no significant ST segment or T wave changes.   11/24 patient with persistent hypotension, and atrial fibrillation. Plan for possible direct current cardioversion, possible upgrade CRT device/ AF ablation.   11/25 patient converted to sinus rhythm, plan for follow up with Afib clinic to start [facility] as inpatient next week. Continue to hold on Entresto.   Assessment and Plan: * Chronic systolic CHF (congestive heart failure) (HCC) Echocardiogram with reduced LV systolic function with LV 25 to 30%, anterior, apical, inferoapical and anterospetal walls are thinned and akinetic.  Mild LV cavity dilatation, RV systolic function with mild reduction, RVSP 28,8 mmHg, LA with moderate dilatation, left atrial size dilatation moderate.   She received IV furosemide on admission. No heart failure exacerbation on this admission.   11/23 Positive orthostatics  Supine 117/84 (89), HR 109 Sitting 125/108 (91), HR 125 Standing 93/69 (78), HR 134  Plan to continue holding entresto due to hypotension, continue with spironolactone.  Follow up with heart failure clinic as outpatient.   CAD in native artery No chest pain, no signs of acute coronary syndrome.   Dyslipidemia, continue with statin therapy.   PAF (paroxysmal atrial fibrillation) (HCC) Patient has converted to sinus rhythm spontaneously.  Plan to hold on sotalol and start patient on Tykosin next week as inpatient.  Continue anticoagulation with apixaban.   CKD stage 3a, GFR 45-59 ml/min (HCC) Baseline serum cr around 1,2. Patient clinically euvolemic, with a discharge serum cr  at 1,31 with K at 4,0 and serum bicarbonate at 27. Na 135 and Mg 2,1   Plan to resume spironolactone home dose 25 mg and take furosemide as needed.  Follow up renal function and electrolytes as outpatient,   IDA (iron  deficiency anemia) Stable cell counts.  Continue oral iron supplementation.   Depression Continue with bupropion, escitalopram will be changed to sertraline to avoid drug to drug interaction with Tykosin.   Hypothyroidism Continue with levothyroxine.          Consultants: EP cardiology  Procedures performed: none   Disposition: Home Diet recommendation:  Discharge Diet Orders (From admission, onward)     Start     Ordered   01/19/23 0000  Diet - low sodium heart healthy        01/19/23 1150           Cardiac diet DISCHARGE MEDICATION: Allergies as of 01/19/2023       Reactions   Paxil [paroxetine] Palpitations   Chlorhexidine Gluconate Itching   Morphine And Codeine Nausea And Vomiting   Percocet [oxycodone-acetaminophen] Nausea And Vomiting   Cefdinir Nausea Only   Zolpidem Other (See Comments)   Doesn't work for patient at all        Medication List     STOP taking these medications    Entresto 24-26 MG Generic drug: sacubitril-valsartan   escitalopram 10 MG tablet Commonly known as: LEXAPRO   sotalol 160 MG tablet Commonly known as: BETAPACE       TAKE these medications    acetaminophen 500 MG tablet Commonly known as: TYLENOL Take 1,000 mg by mouth every 6 (six) hours as needed for mild pain.   buPROPion 300 MG 24 hr tablet Commonly known as: WELLBUTRIN XL Take 150 mg by mouth daily.   CALCIUM + D3 PO Take 1 tablet by mouth every morning.   CoQ-10 200 MG Caps Take 200 mg by mouth daily.   dexlansoprazole 60 MG capsule Commonly known as: DEXILANT TAKE 1 CAPSULE(60 MG) BY MOUTH DAILY   Eliquis 5 MG Tabs tablet Generic drug: apixaban TAKE 1 TABLET(5 MG) BY MOUTH TWICE DAILY   ferrous sulfate 325 (65 FE) MG EC tablet Take 325 mg by mouth daily.   fexofenadine 180 MG tablet Commonly known as: ALLEGRA Take 180 mg by mouth daily.   furosemide 20 MG tablet Commonly known as: LASIX Take 2 tablets as needed in case of weight  gain 2 to 3 lbs in 24 hrs or 5 lbs in 7 days until weight back to baseline. What changed:  additional instructions Another medication with the same name was removed. Continue taking this medication, and follow the directions you see here.   nitroGLYCERIN 0.4 MG SL tablet Commonly known as: NITROSTAT PLACE 1 TABLET UNDER THE TONGUE EVERY 5 MINUTES AS NEEDED FOR CHEST PAIN   rosuvastatin 20 MG tablet Commonly known as: CRESTOR Take 1 tablet (20 mg total) by mouth daily.   sertraline 25 MG tablet Commonly known as: ZOLOFT Take 1 tablet (25 mg total) by mouth daily. Start taking on: January 20, 2023   spironolactone 25 MG tablet Commonly known as: ALDACTONE TAKE 1 TABLET(25 MG) BY MOUTH DAILY   thyroid 120 MG tablet Commonly known as: ARMOUR Take 120 mg by mouth daily before breakfast.   traZODone 50 MG tablet Commonly known as: DESYREL Take 0.5 tablets (25 mg total) by mouth at bedtime as needed for sleep. What changed: when to take this   triamcinolone  55 MCG/ACT Aero nasal inhaler Commonly known as: NASACORT Place 2 sprays into the nose daily.        Discharge Exam: Filed Weights   01/17/23 0502 01/18/23 0500 01/19/23 0509  Weight: 69.9 kg 68 kg 68.7 kg   BP 95/65 (BP Location: Right Arm)   Pulse 60   Temp 98.1 F (36.7 C) (Oral)   Resp 16   Ht 5\' 9"  (1.753 m)   Wt 68.7 kg   SpO2 99%   BMI 22.37 kg/m   Patient with no chest pain, no dizziness or lightheadedness.   Neurology awake and alert ENT with no pallor Cardiovascular with S1 and S2 present and regular with no gallops, rubs or murmurs Respiratory with no rales or wheezing, no rhonchi Abdomen with no distention   Condition at discharge: stable  The results of significant diagnostics from this hospitalization (including imaging, microbiology, ancillary and laboratory) are listed below for reference.   Imaging Studies: CT Head Wo Contrast  Result Date: 01/16/2023 CLINICAL DATA:  Dizziness and  weakness for a week worsening today EXAM: CT HEAD WITHOUT CONTRAST TECHNIQUE: Contiguous axial images were obtained from the base of the skull through the vertex without intravenous contrast. RADIATION DOSE REDUCTION: This exam was performed according to the departmental dose-optimization program which includes automated exposure control, adjustment of the mA and/or kV according to patient size and/or use of iterative reconstruction technique. COMPARISON:  None Available. FINDINGS: Brain: No intracranial hemorrhage, mass effect, or evidence of acute infarct. No hydrocephalus. No extra-axial fluid collection. Vascular: No hyperdense vessel or unexpected calcification. Skull: No fracture or focal lesion. Sinuses/Orbits: No acute finding. Other: None. IMPRESSION: No acute intracranial abnormality. Electronically Signed   By: Minerva Fester M.D.   On: 01/16/2023 19:02   DG Chest 2 View  Result Date: 01/16/2023 CLINICAL DATA:  Chest pain. EXAM: CHEST - 2 VIEW COMPARISON:  04/15/2022. FINDINGS: Bilateral lung fields are clear. Bilateral costophrenic angles are clear. Normal cardio-mediastinal silhouette.  There is a left-sided ICD. No acute osseous abnormalities. The soft tissues are within normal limits. IMPRESSION: *No active cardiopulmonary disease. Electronically Signed   By: Jules Schick M.D.   On: 01/16/2023 17:41    Microbiology: Results for orders placed or performed during the hospital encounter of 04/14/22  SARS Coronavirus 2 by RT PCR (hospital order, performed in [facility] hospital lab) *cepheid single result test* Anterior Nasal Swab     Status: None   Collection Time: 04/14/22 12:34 PM   Specimen: Anterior Nasal Swab  Result Value Ref Range Status   SARS Coronavirus 2 by RT PCR NEGATIVE NEGATIVE Final    Comment: Performed at [facility], [address] [place], [place] [address]  Surgical PCR screen     Status: None   Collection Time: 04/14/22 12:35 PM   Specimen: Nasal Mucosa;  Nasal Swab  Result Value Ref Range Status   MRSA, PCR NEGATIVE NEGATIVE Final   Staphylococcus aureus NEGATIVE NEGATIVE Final    Comment: (NOTE) The Xpert SA Assay (FDA approved for NASAL specimens in patients 64 years of age and older), is one component of a comprehensive surveillance program. It is not intended to diagnose infection nor to guide or monitor treatment. Performed at [facility], [address], [place], [place] [address]     Labs: CBC: Recent Labs  Lab 01/16/23 1635 01/17/23 0455  WBC 6.5 7.0  HGB 11.8* 12.7  HCT 37.5 39.3  MCV 91.5 89.9  PLT 189  161   Basic Metabolic Panel: Recent Labs  Lab 01/16/23 1635 01/17/23 0455 01/18/23 0358 01/19/23 0433  NA 138 138 137 135  K 4.9 4.1 3.7 4.0  CL 105 104 101 102  CO2 24 24 25 27   GLUCOSE 114* 101* 91 98  BUN 27* 22* 23* 25*  CREATININE 1.37* 1.14* 1.14* 1.31*  CALCIUM 8.9 9.0 8.8* 9.0  MG  --  2.1 2.1  --   PHOS  --  3.6  --   --    Liver Function Tests: No results for input(s): "AST", "ALT", "ALKPHOS", "BILITOT", "PROT", "ALBUMIN" in the last 168 hours. CBG: No results for input(s): "GLUCAP" in the last 168 hours.  Discharge time spent: greater than 30 minutes.  Signed: Coralie Keens, MD [facility] 01/19/2023

## 2023-01-19 NOTE — Plan of Care (Signed)

## 2023-01-19 NOTE — Progress Notes (Signed)
   01/19/23 0804  Assess: MEWS Score  Temp 98.1 F (36.7 C)  BP 95/65  MAP (mmHg) 76  ECG Heart Rate (!) 58  Resp 16  Level of Consciousness Alert  SpO2 99 %  O2 Device Room Air  Assess: MEWS Score  MEWS Temp 0  MEWS Systolic 1  MEWS Pulse 0  MEWS RR 0  MEWS LOC 0  MEWS Score 1  MEWS Score Color Green  Assess: SIRS CRITERIA  SIRS Temperature  0  SIRS Pulse 0  SIRS Respirations  0  SIRS WBC 0  SIRS Score Sum  0

## 2023-01-19 NOTE — Assessment & Plan Note (Signed)
Baseline serum cr around 1,2. Patient clinically euvolemic, with a discharge serum cr at 1,31 with K at 4,0 and serum bicarbonate at 27. Na 135 and Mg 2,1   Plan to resume spironolactone home dose 25 mg and take furosemide as needed.  Follow up renal function and electrolytes as outpatient,

## 2023-01-19 NOTE — Progress Notes (Signed)
Heart Failure Navigator Progress Note  Assessed for Heart & Vascular TOC clinic readiness.  Patient does not meet criteria due to Advanced Heart Failure Team patient of Dr. Shirlee Latch.   Navigator will sign off at this time.   Rhae Hammock, BSN, Scientist, clinical (histocompatibility and immunogenetics) Only

## 2023-01-19 NOTE — Progress Notes (Signed)
Patient discharged home by [facility] nurse.  IV removed with the catheter intact.  Patient to be transported by family.  Discharge instructions given to patient who verbalized understanding.  Prescription medications picked up by [facility] at [facility].

## 2023-01-21 ENCOUNTER — Encounter (HOSPITAL_COMMUNITY): Payer: Self-pay | Admitting: Physician Assistant

## 2023-01-21 ENCOUNTER — Telehealth: Payer: Self-pay | Admitting: Pharmacist Clinician (PhC)/ Clinical Pharmacy Specialist

## 2023-01-21 ENCOUNTER — Ambulatory Visit (HOSPITAL_COMMUNITY)
Admission: RE | Admit: 2023-01-21 | Discharge: 2023-01-21 | Disposition: A | Discharge status: Home / Self Care | Payer: BC Managed Care – PPO | Source: Ambulatory Visit | Attending: Physician Assistant | Admitting: Physician Assistant

## 2023-01-21 VITALS — BP 96/68 | HR 68 | Ht 69.0 in | Wt 155.2 lb

## 2023-01-21 DIAGNOSIS — N183 Chronic kidney disease, stage 3 unspecified: Secondary | ICD-10-CM | POA: Insufficient documentation

## 2023-01-21 DIAGNOSIS — I5022 Chronic systolic (congestive) heart failure: Secondary | ICD-10-CM | POA: Insufficient documentation

## 2023-01-21 DIAGNOSIS — I255 Ischemic cardiomyopathy: Secondary | ICD-10-CM | POA: Diagnosis not present

## 2023-01-21 DIAGNOSIS — I130 Hypertensive heart and chronic kidney disease with heart failure and stage 1 through stage 4 chronic kidney disease, or unspecified chronic kidney disease: Secondary | ICD-10-CM | POA: Diagnosis not present

## 2023-01-21 DIAGNOSIS — I447 Left bundle-branch block, unspecified: Secondary | ICD-10-CM | POA: Insufficient documentation

## 2023-01-21 DIAGNOSIS — D6869 Other thrombophilia: Secondary | ICD-10-CM | POA: Insufficient documentation

## 2023-01-21 DIAGNOSIS — I480 Paroxysmal atrial fibrillation: Secondary | ICD-10-CM | POA: Insufficient documentation

## 2023-01-21 DIAGNOSIS — I2510 Atherosclerotic heart disease of native coronary artery without angina pectoris: Secondary | ICD-10-CM | POA: Insufficient documentation

## 2023-01-21 DIAGNOSIS — Z95810 Presence of automatic (implantable) cardiac defibrillator: Secondary | ICD-10-CM | POA: Insufficient documentation

## 2023-01-21 DIAGNOSIS — Z79899 Other long term (current) drug therapy: Secondary | ICD-10-CM | POA: Insufficient documentation

## 2023-01-21 DIAGNOSIS — Z7901 Long term (current) use of anticoagulants: Secondary | ICD-10-CM | POA: Insufficient documentation

## 2023-01-21 DIAGNOSIS — I498 Other specified cardiac arrhythmias: Secondary | ICD-10-CM | POA: Diagnosis not present

## 2023-01-21 DIAGNOSIS — I252 Old myocardial infarction: Secondary | ICD-10-CM | POA: Diagnosis not present

## 2023-01-21 NOTE — Telephone Encounter (Signed)
-----   Message from Nurse Stacy C sent at 01/21/2023  2:33 PM EST ----- Regarding: tikosyn Pt for tikosyn please review meds - pt knows she will need to speak to pcp regarding trazodone thanks stacy

## 2023-01-21 NOTE — Telephone Encounter (Signed)
Medication list reviewed in anticipation of upcoming Tikosyn initiation. Patient is not taking any contraindicated or QTc prolonging medications.   Patient is anticoagulated on Eliquis,  on the appropriate dose. Please ensure that patient has not missed any anticoagulation doses in the 3 weeks prior to Tikosyn initiation.   Patient will need to be counseled to avoid use of Benadryl while on Tikosyn and in the 2-3 days prior to Tikosyn initiation.  Patient on sertraline, so agree that patient should speak to PCP about alternative for trazodone

## 2023-01-21 NOTE — Progress Notes (Signed)
Primary Care Physician: Benita Stabile, MD Primary Cardiologist: Verne Carrow, MD Electrophysiologist: Lanier Prude, MD  Referring Physician: Dr Lalla Brothers [facility]: Dr Lonn Georgia Catherine is a 57 y.o. female with a history of CAD (s/p STEMI in 08/2015), chronic HFrEF s/p St. Jude ICD in place (recent malfunctioning of RV ICD lead and replacement along with ICD generator replacement in 03/2022), history of GIB, mitral regurgitation, HLD, Stage 3 CKD and PVC's (prior PVC ablation in 03/2017), atrial fibrillation who presents for follow up in the [facility].    Patient was admitted 01/16/23 with acute on chronic CHF and afib with RVR. Her sotalol was discontinued with plan for dofetilide as a bridge to ablation. Echo at that time showed EF 25-30%.   Patient is on Eliquis for a CHADS2VASC score of 3.  On follow up today, patient reports that she has done well since leaving the hospital. She remains in SR. No bleeding issues on anticoagulation.   Today, she denies symptoms of palpitations, chest pain, shortness of breath, orthopnea, PND, lower extremity edema, dizziness, presyncope, syncope, snoring, daytime somnolence, bleeding, or neurologic sequela. The patient is tolerating medications without difficulties and is otherwise without complaint today.    Atrial Fibrillation Risk Factors:  she does not have symptoms or diagnosis of sleep apnea. she does not have a history of rheumatic fever. The patient does have a history of early familial atrial fibrillation or other arrhythmias.  Atrial Fibrillation Management history:  Previous antiarrhythmic drugs: amiodarone, sotalol  Previous cardioversions: none Previous ablations: none Anticoagulation history: Eliquis  ROS- All systems are reviewed and negative except as per the HPI above.  Past Medical History:  Diagnosis Date   AICD (automatic cardioverter/defibrillator) present    St. Jude   Anemia     CAD in native artery    a. late recognition of presentation of STEMI 08/2015 s/p DES to LAD, mild residual mRCA.   Chronic systolic CHF (congestive heart failure) (HCC)    CKD (chronic kidney disease), stage III (HCC)    Depression    Hypertension    Hypotension    a. preventing med titration for HF.   Hypothyroidism 09/24/2015   Ischemic cardiomyopathy    Myocardial infarction [facility]) 08/2015   PAF (paroxysmal atrial fibrillation) (HCC) 09/24/2015   a. dx at time of STEMI 08/2015.   Pre-diabetes    Presence of permanent cardiac pacemaker    patient has ICD   PVC's (premature ventricular contractions)     Current Outpatient Medications  Medication Sig Dispense Refill   acetaminophen (TYLENOL) 500 MG tablet Take 1,000 mg by mouth every 6 (six) hours as needed for mild pain.     buPROPion (WELLBUTRIN XL) 300 MG 24 hr tablet Take 150 mg by mouth daily.     Calcium Carb-Cholecalciferol (CALCIUM + D3 PO) Take 1 tablet by mouth every morning.     Coenzyme Q10 (COQ-10) 200 MG CAPS Take 200 mg by mouth daily.     dexlansoprazole (DEXILANT) 60 MG capsule TAKE 1 CAPSULE(60 MG) BY MOUTH DAILY 90 capsule 3   ELIQUIS 5 MG TABS tablet TAKE 1 TABLET(5 MG) BY MOUTH TWICE DAILY 180 tablet 3   ferrous sulfate 325 (65 FE) MG EC tablet Take 325 mg by mouth daily.     fexofenadine (ALLEGRA) 180 MG tablet Take 180 mg by mouth daily.     furosemide (LASIX) 20 MG tablet Take 2 tablets as needed in case of weight  gain 2 to 3 lbs in 24 hrs or 5 lbs in 7 days until weight back to baseline. 90 tablet 3   nitroGLYCERIN (NITROSTAT) 0.4 MG SL tablet PLACE 1 TABLET UNDER THE TONGUE EVERY 5 MINUTES AS NEEDED FOR CHEST PAIN 25 tablet 3   rosuvastatin (CRESTOR) 20 MG tablet Take 1 tablet (20 mg total) by mouth daily. 90 tablet 3   sertraline (ZOLOFT) 25 MG tablet Take 1 tablet (25 mg total) by mouth daily. 30 tablet 0   spironolactone (ALDACTONE) 25 MG tablet TAKE 1 TABLET(25 MG) BY MOUTH DAILY 30 tablet 11   thyroid  (ARMOUR) 120 MG tablet Take 120 mg by mouth daily before breakfast.     traZODone (DESYREL) 50 MG tablet Take 0.5 tablets (25 mg total) by mouth at bedtime as needed for sleep. (Patient taking differently: Take 25 mg by mouth at bedtime.) 30 tablet 2   triamcinolone (NASACORT) 55 MCG/ACT AERO nasal inhaler Place 2 sprays into the nose daily.      No current facility-administered medications for this encounter.    Physical Exam: BP 96/68   Pulse 68   Ht 5\' 9"  (1.753 m)   Wt 70.4 kg   BMI 22.92 kg/m   GEN: Well nourished, well developed in no acute distress NECK: No JVD; No carotid bruits CARDIAC: Regular rate and rhythm, no murmurs, rubs, gallops RESPIRATORY:  Clear to auscultation without rales, wheezing or rhonchi  ABDOMEN: Soft, non-tender, non-distended EXTREMITIES:  No edema; No deformity   Wt Readings from Last 3 Encounters:  01/21/23 70.4 kg  01/19/23 68.7 kg  11/20/22 70.8 kg     EKG today demonstrates  SR, LBBB Vent. rate 68 BPM PR interval 188 ms QRS duration 166 ms QT/QTcB 436/463 ms   Echo 11/20/22 demonstrated   1. The anterior, apical, inferoapical and anteroseptal walls are thinned  and akinetic. No LV thrombus by Definity contrast. Left ventricular  ejection fraction, by estimation, is 25 to 30%. The left ventricle has  severely decreased function. The left ventricle demonstrates regional wall motion abnormalities (see scoring diagram/findings for description). The left ventricular internal cavity size was mildly dilated. Left ventricular diastolic parameters are consistent with Grade I diastolic dysfunction (impaired relaxation).   2. Right ventricular systolic function is mildly reduced. The right  ventricular size is normal. There is normal pulmonary artery systolic  pressure. The estimated right ventricular systolic pressure is 28.8 mmHg.   3. Left atrial size was moderately dilated.   4. The mitral valve is abnormal. Mild mitral valve regurgitation.    5. The aortic valve is tricuspid. Aortic valve regurgitation is not  visualized.   6. The inferior vena cava is normal in size with greater than 50%  respiratory variability, suggesting right atrial pressure of 3 mmHg.   Comparison(s): No significant change from prior study. 05/08/2021: LVEF  25-30%, akinesis inferoseptal/anteroseptal, apical lateral wall and apex.    CHA2DS2-VASc Score = 3  The patient's score is based upon: CHF History: 1 HTN History: 0 Diabetes History: 0 Stroke History: 0 Vascular Disease History: 1 Age Score: 0 Gender Score: 1       ASSESSMENT AND PLAN: Paroxysmal Atrial Fibrillation (ICD10:  I48.0) The patient's CHA2DS2-VASc score is 3, indicating a 3.2% annual risk of stroke.   Previously failed amiodarone (intolerable side effects) and sotalol (ineffective) Patient would like to pursue dofetilide admission. Continue Eliquis 5 mg BID, states no missed doses in the last 3 weeks. No recent benadryl use PharmD to  screen medications QTc in SR 463 ms in setting of LBBB Recent bmet/mag reviewed  Secondary Hypercoagulable State (ICD10:  D68.69) The patient is at significant risk for stroke/thromboembolism based upon her CHA2DS2-VASc Score of 3.  Continue Apixaban (Eliquis).   CAD S/p STEMI 2017 No anginal symptoms  Chronic HFrEF Ischemic CM, s/p ICD EF 25% NYHA class II Fluid status appears stable GDMT limited by hypotension and yeast infections Followed in [facility]   Follow up in the AF clinic 12/10 for dofetilide loading.       Catherine Loa PA-C Afib Clinic [facility] [address] [place], [place] [address] [phone]

## 2023-01-22 ENCOUNTER — Other Ambulatory Visit (HOSPITAL_COMMUNITY): Payer: Self-pay | Admitting: Cardiology

## 2023-01-23 ENCOUNTER — Ambulatory Visit: Payer: BC Managed Care – PPO

## 2023-01-23 DIAGNOSIS — I255 Ischemic cardiomyopathy: Secondary | ICD-10-CM

## 2023-01-23 LAB — CUP PACEART REMOTE DEVICE CHECK
Battery Remaining Longevity: 109 mo
Battery Remaining Percentage: 87 %
Battery Voltage: 3.02 V
Brady Statistic RV Percent Paced: 1 %
Date Time Interrogation Session: 20241128012121
HighPow Impedance: 73 Ohm
Implantable Lead Connection Status: 753985
Implantable Lead Implant Date: 20240219
Implantable Lead Location: 753860
Implantable Pulse Generator Implant Date: 20240219
Lead Channel Impedance Value: 410 Ohm
Lead Channel Pacing Threshold Amplitude: 1.5 V
Lead Channel Pacing Threshold Pulse Width: 0.5 ms
Lead Channel Sensing Intrinsic Amplitude: 11.7 mV
Lead Channel Setting Pacing Amplitude: 2.5 V
Lead Channel Setting Pacing Pulse Width: 0.4 ms
Lead Channel Setting Sensing Sensitivity: 0.5 mV
Pulse Gen Serial Number: 810054849
Zone Setting Status: 755011

## 2023-01-23 LAB — T3: T3, Total: 146 ng/dL (ref 71–180)

## 2023-01-28 ENCOUNTER — Telehealth: Payer: Self-pay

## 2023-01-28 NOTE — Telephone Encounter (Signed)
Pharmacy Patient Advocate Encounter   Received notification from CoverMyMeds that prior authorization for Dexlansoprazole 60 mg dr capsules is required/requested.   Insurance verification completed.   The patient is insured through [employer] .   Per test claim: PA required; PA submitted to above mentioned insurance via CoverMyMeds Key/confirmation #/EOC [facility] Status is pending

## 2023-01-30 ENCOUNTER — Telehealth (HOSPITAL_COMMUNITY): Payer: Self-pay | Admitting: *Deleted

## 2023-01-30 NOTE — Telephone Encounter (Signed)
Tikosyn hospital admission authorized for 02/03/23 auth number [id number]

## 2023-02-02 ENCOUNTER — Encounter (HOSPITAL_COMMUNITY): Payer: Self-pay

## 2023-02-02 ENCOUNTER — Encounter (HOSPITAL_COMMUNITY): Payer: Self-pay | Admitting: Hematology

## 2023-02-02 ENCOUNTER — Other Ambulatory Visit (HOSPITAL_COMMUNITY): Payer: Self-pay

## 2023-02-02 NOTE — Telephone Encounter (Signed)
Pharmacy Patient Advocate Encounter  Received notification from [facility] that Prior Authorization for DEXLANSOPRAZOLE has been APPROVED from 01/28/2023 to 01/28/2024 . Pt has already filled at [facility] on 01/29/23 for 90 days.   PA #/Case ID/Reference #: [id number]

## 2023-02-03 ENCOUNTER — Other Ambulatory Visit: Payer: Self-pay

## 2023-02-03 ENCOUNTER — Encounter (HOSPITAL_COMMUNITY): Payer: Self-pay | Admitting: Physician Assistant

## 2023-02-03 ENCOUNTER — Encounter (HOSPITAL_COMMUNITY): Payer: Self-pay | Admitting: Cardiology

## 2023-02-03 ENCOUNTER — Other Ambulatory Visit (HOSPITAL_COMMUNITY): Payer: Self-pay

## 2023-02-03 ENCOUNTER — Ambulatory Visit (HOSPITAL_BASED_OUTPATIENT_CLINIC_OR_DEPARTMENT_OTHER)
Admission: RE | Admit: 2023-02-03 | Discharge: 2023-02-03 | Disposition: A | Discharge status: Home / Self Care | Payer: BC Managed Care – PPO | Source: Ambulatory Visit | Attending: Physician Assistant | Admitting: Physician Assistant

## 2023-02-03 ENCOUNTER — Inpatient Hospital Stay (HOSPITAL_COMMUNITY)
Admission: AD | Admit: 2023-02-03 | Discharge: 2023-02-06 | DRG: 309 | Disposition: A | Discharge status: Home / Self Care | Payer: BC Managed Care – PPO | Source: Ambulatory Visit | Attending: Cardiology | Admitting: Cardiology

## 2023-02-03 ENCOUNTER — Telehealth (HOSPITAL_COMMUNITY): Payer: Self-pay | Admitting: Pharmacy Technician

## 2023-02-03 VITALS — BP 100/70 | HR 70 | Ht 69.0 in | Wt 158.6 lb

## 2023-02-03 DIAGNOSIS — I130 Hypertensive heart and chronic kidney disease with heart failure and stage 1 through stage 4 chronic kidney disease, or unspecified chronic kidney disease: Secondary | ICD-10-CM | POA: Insufficient documentation

## 2023-02-03 DIAGNOSIS — D6869 Other thrombophilia: Secondary | ICD-10-CM | POA: Diagnosis present

## 2023-02-03 DIAGNOSIS — E039 Hypothyroidism, unspecified: Secondary | ICD-10-CM | POA: Diagnosis present

## 2023-02-03 DIAGNOSIS — R7303 Prediabetes: Secondary | ICD-10-CM | POA: Diagnosis present

## 2023-02-03 DIAGNOSIS — I959 Hypotension, unspecified: Secondary | ICD-10-CM | POA: Diagnosis present

## 2023-02-03 DIAGNOSIS — I5022 Chronic systolic (congestive) heart failure: Secondary | ICD-10-CM | POA: Insufficient documentation

## 2023-02-03 DIAGNOSIS — I252 Old myocardial infarction: Secondary | ICD-10-CM | POA: Insufficient documentation

## 2023-02-03 DIAGNOSIS — I255 Ischemic cardiomyopathy: Secondary | ICD-10-CM | POA: Diagnosis present

## 2023-02-03 DIAGNOSIS — I340 Nonrheumatic mitral (valve) insufficiency: Secondary | ICD-10-CM | POA: Insufficient documentation

## 2023-02-03 DIAGNOSIS — I2510 Atherosclerotic heart disease of native coronary artery without angina pectoris: Secondary | ICD-10-CM | POA: Diagnosis present

## 2023-02-03 DIAGNOSIS — I480 Paroxysmal atrial fibrillation: Principal | ICD-10-CM | POA: Diagnosis present

## 2023-02-03 DIAGNOSIS — N1831 Chronic kidney disease, stage 3a: Secondary | ICD-10-CM | POA: Diagnosis present

## 2023-02-03 DIAGNOSIS — I452 Bifascicular block: Secondary | ICD-10-CM | POA: Diagnosis present

## 2023-02-03 DIAGNOSIS — Z79899 Other long term (current) drug therapy: Secondary | ICD-10-CM | POA: Insufficient documentation

## 2023-02-03 DIAGNOSIS — Z7901 Long term (current) use of anticoagulants: Secondary | ICD-10-CM | POA: Insufficient documentation

## 2023-02-03 DIAGNOSIS — E785 Hyperlipidemia, unspecified: Secondary | ICD-10-CM | POA: Diagnosis present

## 2023-02-03 DIAGNOSIS — Z23 Encounter for immunization: Secondary | ICD-10-CM | POA: Diagnosis not present

## 2023-02-03 DIAGNOSIS — Z955 Presence of coronary angioplasty implant and graft: Secondary | ICD-10-CM

## 2023-02-03 DIAGNOSIS — Z95810 Presence of automatic (implantable) cardiac defibrillator: Secondary | ICD-10-CM | POA: Insufficient documentation

## 2023-02-03 DIAGNOSIS — I447 Left bundle-branch block, unspecified: Secondary | ICD-10-CM | POA: Insufficient documentation

## 2023-02-03 DIAGNOSIS — I493 Ventricular premature depolarization: Secondary | ICD-10-CM | POA: Diagnosis present

## 2023-02-03 DIAGNOSIS — N183 Chronic kidney disease, stage 3 unspecified: Secondary | ICD-10-CM | POA: Insufficient documentation

## 2023-02-03 LAB — BASIC METABOLIC PANEL
Anion gap: 9 (ref 5–15)
Anion gap: 8 (ref 5–15)
BUN: 17 mg/dL (ref 6–20)
BUN: 16 mg/dL (ref 6–20)
CO2: 25 mmol/L (ref 22–32)
CO2: 28 mmol/L (ref 22–32)
Calcium: 8.8 mg/dL — ABNORMAL LOW (ref 8.9–10.3)
Calcium: 8.7 mg/dL — ABNORMAL LOW (ref 8.9–10.3)
Chloride: 105 mmol/L (ref 98–111)
Chloride: 104 mmol/L (ref 98–111)
Creatinine, Ser: 1.33 mg/dL — ABNORMAL HIGH (ref 0.44–1.00)
Creatinine, Ser: 1.13 mg/dL — ABNORMAL HIGH (ref 0.44–1.00)
GFR, Estimated: 47 mL/min — ABNORMAL LOW (ref >60)
GFR, Estimated: 57 mL/min — ABNORMAL LOW (ref >60)
Glucose, Bld: 140 mg/dL — ABNORMAL HIGH (ref 70–99)
Glucose, Bld: 148 mg/dL — ABNORMAL HIGH (ref 70–99)
Potassium: 3.5 mmol/L (ref 3.5–5.1)
Potassium: 4.5 mmol/L (ref 3.5–5.1)
Sodium: 139 mmol/L (ref 135–145)
Sodium: 140 mmol/L (ref 135–145)

## 2023-02-03 LAB — MAGNESIUM: Magnesium: 2 mg/dL (ref 1.7–2.4)

## 2023-02-03 MED ORDER — SERTRALINE HCL 50 MG PO TABS
25.0000 mg | ORAL_TABLET | Freq: Every day | ORAL | Status: DC
  Administered 2023-02-04 – 2023-02-06 (×3): 25 mg via ORAL
  Filled 2023-02-03 (×3): qty 1

## 2023-02-03 MED ORDER — THYROID 60 MG PO TABS
120.0000 mg | ORAL_TABLET | Freq: Every day | ORAL | Status: DC
Start: 2023-02-04 — End: 2023-02-06
  Administered 2023-02-04 – 2023-02-06 (×3): 120 mg via ORAL
  Filled 2023-02-03 (×3): qty 2

## 2023-02-03 MED ORDER — POTASSIUM CHLORIDE CRYS ER 20 MEQ PO TBCR
60.0000 meq | EXTENDED_RELEASE_TABLET | Freq: Once | ORAL | Status: AC
  Administered 2023-02-03: 60 meq via ORAL
  Filled 2023-02-03: qty 3

## 2023-02-03 MED ORDER — SPIRONOLACTONE 25 MG PO TABS
25.0000 mg | ORAL_TABLET | Freq: Every day | ORAL | Status: DC
Start: 2023-02-03 — End: 2023-02-06
  Administered 2023-02-03 – 2023-02-05 (×3): 25 mg via ORAL
  Filled 2023-02-03 (×3): qty 1

## 2023-02-03 MED ORDER — SODIUM CHLORIDE 0.9 % IV SOLN: 250.0000 mL | INTRAVENOUS | Status: DC | PRN

## 2023-02-03 MED ORDER — PANTOPRAZOLE SODIUM 40 MG PO TBEC
40.0000 mg | DELAYED_RELEASE_TABLET | Freq: Every day | ORAL | Status: DC
  Administered 2023-02-04 – 2023-02-06 (×3): 40 mg via ORAL
  Filled 2023-02-03 (×3): qty 1

## 2023-02-03 MED ORDER — SODIUM CHLORIDE 0.9% FLUSH: 3.0000 mL | INTRAVENOUS | Status: DC | PRN

## 2023-02-03 MED ORDER — ROSUVASTATIN CALCIUM 20 MG PO TABS
20.0000 mg | ORAL_TABLET | Freq: Every day | ORAL | Status: DC
  Administered 2023-02-04 – 2023-02-06 (×3): 20 mg via ORAL
  Filled 2023-02-03 (×3): qty 1

## 2023-02-03 MED ORDER — BUPROPION HCL ER (XL) 150 MG PO TB24
300.0000 mg | ORAL_TABLET | Freq: Every day | ORAL | Status: DC
  Administered 2023-02-04 – 2023-02-06 (×3): 300 mg via ORAL
  Filled 2023-02-03 (×3): qty 2

## 2023-02-03 MED ORDER — ACETAMINOPHEN 500 MG PO TABS: 1000.0000 mg | ORAL_TABLET | Freq: Four times a day (QID) | ORAL | Status: DC | PRN

## 2023-02-03 MED ORDER — SPIRONOLACTONE 25 MG PO TABS
25.0000 mg | ORAL_TABLET | Freq: Every day | ORAL | Status: DC
Start: 2023-02-04 — End: 2023-02-03

## 2023-02-03 MED ORDER — COQ-10 200 MG PO CAPS: 200.0000 mg | ORAL_CAPSULE | Freq: Every day | ORAL | Status: DC

## 2023-02-03 MED ORDER — APIXABAN 5 MG PO TABS
5.0000 mg | ORAL_TABLET | Freq: Two times a day (BID) | ORAL | Status: DC
  Administered 2023-02-03 – 2023-02-06 (×6): 5 mg via ORAL
  Filled 2023-02-03 (×6): qty 1

## 2023-02-03 MED ORDER — FERROUS SULFATE 325 (65 FE) MG PO TABS
325.0000 mg | ORAL_TABLET | Freq: Every day | ORAL | Status: DC
  Administered 2023-02-04 – 2023-02-06 (×3): 325 mg via ORAL
  Filled 2023-02-03 (×4): qty 1

## 2023-02-03 MED ORDER — PNEUMOCOCCAL 20-VAL CONJ VACC 0.5 ML IM SUSY
0.5000 mL | PREFILLED_SYRINGE | INTRAMUSCULAR | Status: AC
  Administered 2023-02-06: 0.5 mL via INTRAMUSCULAR
  Filled 2023-02-03: qty 0.5

## 2023-02-03 MED ORDER — TRIAMCINOLONE ACETONIDE 55 MCG/ACT NA AERO
2.0000 | INHALATION_SPRAY | Freq: Every day | NASAL | Status: DC
  Administered 2023-02-04 – 2023-02-06 (×3): 2 via NASAL
  Filled 2023-02-03: qty 21.6

## 2023-02-03 MED ORDER — SODIUM CHLORIDE 0.9% FLUSH
3.0000 mL | Freq: Two times a day (BID) | INTRAVENOUS | Status: DC
  Administered 2023-02-03 – 2023-02-05 (×3): 3 mL via INTRAVENOUS

## 2023-02-03 MED ORDER — NITROGLYCERIN 0.4 MG SL SUBL: 0.4000 mg | SUBLINGUAL_TABLET | SUBLINGUAL | Status: DC | PRN

## 2023-02-03 MED ORDER — LORATADINE 10 MG PO TABS
10.0000 mg | ORAL_TABLET | Freq: Every day | ORAL | Status: DC
  Administered 2023-02-04 – 2023-02-06 (×3): 10 mg via ORAL
  Filled 2023-02-03 (×3): qty 1

## 2023-02-03 MED ORDER — ZOLPIDEM TARTRATE 5 MG PO TABS
5.0000 mg | ORAL_TABLET | Freq: Every evening | ORAL | Status: DC | PRN
  Administered 2023-02-03: 5 mg via ORAL
  Filled 2023-02-03: qty 1

## 2023-02-03 MED ORDER — FUROSEMIDE 40 MG PO TABS
40.0000 mg | ORAL_TABLET | Freq: Every day | ORAL | Status: DC | PRN
  Administered 2023-02-04: 40 mg via ORAL
  Filled 2023-02-03: qty 1

## 2023-02-03 MED ORDER — METOPROLOL SUCCINATE ER 25 MG PO TB24
25.0000 mg | ORAL_TABLET | Freq: Every day | ORAL | Status: DC
Start: 2023-02-03 — End: 2023-02-06
  Administered 2023-02-03 – 2023-02-05 (×3): 25 mg via ORAL
  Filled 2023-02-03 (×3): qty 1

## 2023-02-03 MED ORDER — DOFETILIDE 250 MCG PO CAPS
250.0000 ug | ORAL_CAPSULE | Freq: Two times a day (BID) | ORAL | Status: DC
  Administered 2023-02-03 – 2023-02-06 (×6): 250 ug via ORAL
  Filled 2023-02-03 (×6): qty 1

## 2023-02-03 NOTE — Telephone Encounter (Signed)
Patient Product/process development scientist completed.    The patient is insured through [employer]. Patient has [employer], may use a copay card, and/or apply for patient assistance if available.    Ran test claim for dofetilide (Tikosyn) 250 mcg and the current 30 day co-pay is $0.00.   This test claim was processed through [facility]- copay amounts may vary at other pharmacies due to pharmacy/plan contracts, or as the patient moves through the different stages of their insurance plan.     Roland Earl, CPHT Pharmacy Technician III Certified Patient Advocate [facility] Patient Advocate Team Direct Number: [phone]  Fax: [phone]

## 2023-02-03 NOTE — H&P (Addendum)
Primary Care Physician: Benita Stabile, MD Primary Cardiologist: Verne Carrow, MD Electrophysiologist: Lanier Prude, MD  Referring Physician: Dr Lalla Brothers [facility]: Dr Lonn [place] Catherine Mays is a 57 y.o. female with a history of CAD (s/p STEMI in 08/2015), chronic HFrEF s/p St. Jude ICD in place (recent malfunctioning of RV ICD lead and replacement along with ICD generator replacement in 03/2022), history of GIB, mitral regurgitation, HLD, Stage 3 CKD and PVC's (prior PVC ablation in 03/2017), atrial fibrillation who presents for follow up in the [facility].    Patient was admitted 01/16/23 with acute on chronic CHF and afib with RVR. Her sotalol was discontinued with plan for dofetilide as a bridge to ablation. Echo at that time showed EF 25-30%.   Patient is on Eliquis for a CHADS2VASC score of 3.  On follow up today, patient presents for dofetilide admission. She is in SR today but has noted on and off afib since her last visit. She denies any missed doses of anticoagulation.   Today, she denies symptoms of chest pain, shortness of breath, orthopnea, PND, lower extremity edema, dizziness, presyncope, syncope, snoring, daytime somnolence, bleeding, or neurologic sequela. The patient is tolerating medications without difficulties and is otherwise without complaint today.    Atrial Fibrillation Risk Factors:  she does not have symptoms or diagnosis of sleep apnea. she does not have a history of rheumatic fever. The patient does have a history of early familial atrial fibrillation or other arrhythmias.  Atrial Fibrillation Management history:  Previous antiarrhythmic drugs: amiodarone, sotalol  Previous cardioversions: none Previous ablations: none Anticoagulation history: Eliquis  ROS- All systems are reviewed and negative except as per the HPI above.  Past Medical History:  Diagnosis Date   AICD (automatic cardioverter/defibrillator) present     St. Jude   Anemia    CAD in native artery    a. late recognition of presentation of STEMI 08/2015 s/p DES to LAD, mild residual mRCA.   Chronic systolic CHF (congestive heart failure) (HCC)    CKD (chronic kidney disease), stage III (HCC)    Depression    Hypertension    Hypotension    a. preventing med titration for HF.   Hypothyroidism 09/24/2015   Ischemic cardiomyopathy    Myocardial infarction [facility]) 08/2015   PAF (paroxysmal atrial fibrillation) (HCC) 09/24/2015   a. dx at time of STEMI 08/2015.   Pre-diabetes    Presence of permanent cardiac pacemaker    patient has ICD   PVC's (premature ventricular contractions)     Current Facility-Administered Medications  Medication Dose Route Frequency Provider Last Rate Last Admin   acetaminophen (TYLENOL) tablet 1,000 mg  1,000 mg Oral Q6H PRN Fenton, Clint R, PA       apixaban (ELIQUIS) tablet 5 mg  5 mg Oral BID Lanier Prude, MD       [START ON 02/04/2023] buPROPion (WELLBUTRIN XL) 24 hr tablet 300 mg  300 mg Oral Daily Fenton, Clint R, PA       [START ON 02/04/2023] CoQ-10 CAPS 200 mg  200 mg Oral Daily Fenton, Clint R, PA       dofetilide (TIKOSYN) capsule 250 mcg  250 mcg Oral BID Lanier Prude, MD       [START ON 02/04/2023] ferrous sulfate EC tablet 325 mg  325 mg Oral Daily Fenton, Clint R, PA       furosemide (LASIX) tablet 40 mg  40 mg Oral Daily PRN  Fenton, Clint R, [place]       [START ON 02/04/2023] loratadine (CLARITIN) tablet 10 mg  10 mg Oral Daily Fenton, Clint R, PA       nitroGLYCERIN (NITROSTAT) SL tablet 0.4 mg  0.4 mg Sublingual Q5 min PRN Fenton, Clint R, PA       [START ON 02/04/2023] pantoprazole (PROTONIX) EC tablet 40 mg  40 mg Oral Daily Fenton, Clint R, PA       [START ON 02/04/2023] pneumococcal 20-valent conjugate vaccine (PREVNAR 20) injection 0.5 mL  0.5 mL Intramuscular Tomorrow-1000 Fenton, Clint R, PA       potassium chloride SA (KLOR-CON M) CR tablet 60 mEq  60 mEq Oral Once Lanier Prude, MD       [START ON 02/04/2023] rosuvastatin (CRESTOR) tablet 20 mg  20 mg Oral Daily Fenton, Clint R, PA       [START ON 02/04/2023] sertraline (ZOLOFT) tablet 25 mg  25 mg Oral Daily Fenton, Clint R, PA       sodium chloride flush (NS) 0.9 % injection 3 mL  3 mL Intravenous Q12H Steffanie Dunn T, MD       sodium chloride flush (NS) 0.9 % injection 3 mL  3 mL Intravenous PRN Lanier Prude, MD       [START ON 02/04/2023] spironolactone (ALDACTONE) tablet 25 mg  25 mg Oral Daily Fenton, Clint R, PA       [START ON 02/05/2023] thyroid (ARMOUR) tablet 120 mg  120 mg Oral QAC breakfast Fenton, Clint R, PA       [START ON 02/04/2023] triamcinolone (NASACORT) nasal inhaler 2 spray  2 spray Nasal Daily Fenton, Clint R, PA       zolpidem (AMBIEN) tablet 5 mg  5 mg Oral QHS Fenton, Clint R, PA        Physical Exam: BP 106/69 (BP Location: Left Arm)   Pulse 74   Temp 98.4 F (36.9 C) (Oral)   Resp 16   Ht 5\' 9"  (1.753 m)   Wt 71.9 kg   SpO2 99%   BMI 23.42 kg/m   GEN: Well nourished, well developed in no acute distress NECK: No JVD; No carotid bruits CARDIAC: Regular rate and rhythm with occasional ectopy, no murmurs, rubs, gallops RESPIRATORY:  Clear to auscultation without rales, wheezing or rhonchi  ABDOMEN: Soft, non-tender, non-distended EXTREMITIES:  No edema; No deformity    Wt Readings from Last 3 Encounters:  02/03/23 71.9 kg  02/03/23 71.9 kg  01/21/23 70.4 kg     EKG today demonstrates  SR, LBBB, PVCs Vent. rate 70 BPM PR interval 190 ms QRS duration 132 ms QT/QTcB 414/447 ms   Echo 11/20/22 demonstrated   1. The anterior, apical, inferoapical and anteroseptal walls are thinned  and akinetic. No LV thrombus by Definity contrast. Left ventricular  ejection fraction, by estimation, is 25 to 30%. The left ventricle has  severely decreased function. The left ventricle demonstrates regional wall motion abnormalities (see scoring diagram/findings for description).  The left ventricular internal cavity size was mildly dilated. Left ventricular diastolic parameters are consistent with Grade I diastolic dysfunction (impaired relaxation).   2. Right ventricular systolic function is mildly reduced. The right  ventricular size is normal. There is normal pulmonary artery systolic  pressure. The estimated right ventricular systolic pressure is 28.8 mmHg.   3. Left atrial size was moderately dilated.   4. The mitral valve is abnormal. Mild mitral valve regurgitation.   5. The  aortic valve is tricuspid. Aortic valve regurgitation is not  visualized.   6. The inferior vena cava is normal in size with greater than 50%  respiratory variability, suggesting right atrial pressure of 3 mmHg.   Comparison(s): No significant change from prior study. 05/08/2021: LVEF  25-30%, akinesis inferoseptal/anteroseptal, apical lateral wall and apex.    CHA2DS2-VASc Score = 3  The patient's score is based upon: CHF History: 1 HTN History: 0 Diabetes History: 0 Stroke History: 0 Vascular Disease History: 1 Age Score: 0 Gender Score: 1       ASSESSMENT AND PLAN: Paroxysmal Atrial Fibrillation (ICD10:  I48.0) The patient's CHA2DS2-VASc score is 3, indicating a 3.2% annual risk of stroke.   Previously failed amiodarone (intolerable side effects) and sotalol (ineffective) Patient presents for dofetilide admission. Continue Eliquis 5 mg BID, states no missed doses in the last 3 weeks. No recent benadryl use PharmD has screened medications Mays in SR 447 ms in setting of LBBB Labs today show creatinine at 1.33, Mays+ 3.5 and mag 2.0, CrCl calculated at 53 mL/min  Secondary Hypercoagulable State (ICD10:  D68.69) The patient is at significant risk for stroke/thromboembolism based upon her CHA2DS2-VASc Score of 3.  Continue Apixaban (Eliquis).   CAD S/p STEMI 2017 No anginal symptoms  Chronic HFrEF Ischemic CM, s/p ICD EF 25% NYHA class II GDMT limited by hypotension and  yeast infections Followed in Adv HF Fluid status appears stable    Addendum:: AF clinic note above to serve at H&P  Potassium replacement per pharmacy protocol Amanada Philbrick follow PVC burden off sotalol, initiate 25mg  toprol nightly Patient took trazodone 12/9 PM. Discussed with pharmacy and Dr. Elberta Fortis, who are ok with starting tikosyn 12/10 PM  I have seen and examined this patient with Sherie Don.  Agree with above, note added to reflect my findings.  Patient admitted for tikosyn load. Has CHF, PVCs. Was on sotalol and had AF not controlled. Admit for tikosyn load.   GEN: Well nourished, well developed, in no acute distress  HEENT: normal  Neck: no JVD, carotid bruits, or masses Cardiac: RRR; no murmurs, rubs, or gallops,no edema  Respiratory:  clear to auscultation bilaterally, normal work of breathing GI: soft, nontender, nondistended, + BS MS: no deformity or atrophy  Skin: warm and dry, device site well healed Neuro:  Strength and sensation are intact Psych: euthymic mood, full affect   Paroxysmal atrial fibrillation: Mays stable. Having PVCs, but coupling interval ok for tikosyn. Took trazadone last night but Catherine Mays Mays overnight on tikosyn. Catherine Mays. Chronic systolic heart failure: no obvious volume overload. Maximilliano Kersh add metoprolol as she is no longer taking sotalol Coronary artery disease: no chest pain PVCs: now off sotalol. Starting metoprolol   Shiva Karis M. Sheppard Luckenbach MD 02/03/2023 3:06 PM

## 2023-02-03 NOTE — Care Management (Signed)
Tikosyn test claim shows $0 copay   Patient would like her initial script filled through [facility] pharmacy and refills sent to:  [facility] [address] [place], [place], [place] [address] Phone: [phone]

## 2023-02-03 NOTE — Progress Notes (Addendum)
Pharmacy: Dofetilide (Tikosyn) - Initial Consult Assessment and Electrolyte Replacement  Pharmacy consulted to assist in monitoring and replacing electrolytes in this 57 y.o. female admitted on 02/03/2023 undergoing dofetilide initiation. First dofetilide dose: 250 mcg BID - tentative 12/10 PM pending electrolytes  Assessment:  Patient Exclusion Criteria: If any screening criteria checked as "Yes", then  patient  should NOT receive dofetilide until criteria item is corrected.  If "Yes" please indicate correction plan.  YES  NO Patient  Exclusion Criteria Correction Plan   []   [x]   Baseline QTc interval is greater than or equal to 440 msec. IF above YES box checked dofetilide contraindicated unless patient has ICD; then may proceed if QTc 500-550 msec or with known ventricular conduction abnormalities may proceed with QTc 550-600 msec. QTc =447 msec in setting of LBBB    []   [x]   Patient is known or suspected to have a digoxin level greater than 2 ng/ml: No results found for: "DIGOXIN"     []   [x]   Creatinine clearance less than 20 ml/min (calculated using Cockcroft-Gault, actual body weight and serum creatinine): CrCl = 53 mL/min using actual BW (SCr 1.33)     [x]   []  Patient has received drugs known to prolong the QT intervals within the last 48 hours (phenothiazines, tricyclics or tetracyclic antidepressants, erythromycin, H-1 antihistamines, cisapride, fluoroquinolones, azithromycin, ondansetron).   Updated information on QT prolonging agents is available to be searched on the following database:QT prolonging agents  Patient was instructed to hold trazodone but reported taking 12/9 PM.  Also takes Zoloft 25 mg.   []   []  Patient received a dose of a thiazide diuretic in the last 48 hours [including hydrochlorothiazide (Oretic) alone or in any combination including triamterene (Dyazide, Maxzide)].    []   [x]  Patient received a medication known to increase dofetilide plasma  concentrations prior to initial dofetilide dose:  Trimethoprim (Primsol, Proloprim) in the last 36 hours Verapamil (Calan, Verelan) in the last 36 hours or a sustained release dose in the last 72 hours Megestrol (Megace) in the last 5 days  Cimetidine (Tagamet) in the last 6 hours Ketoconazole (Nizoral) in the last 24 hours Itraconazole (Sporanox) in the last 48 hours  Prochlorperazine (Compazine) in the last 36 hours     []   [x]   Patient is known to have a history of torsades de pointes; congenital or acquired long QT syndromes.    []   [x]   Patient has received a Class 1 antiarrhythmic with less than 2 half-lives since last dose. (Disopyramide, Quinidine, Procainamide, Lidocaine, Mexiletine, Flecainide, Propafenone)    []   [x]   Patient has received amiodarone therapy in the past 3 months or amiodarone level is greater than 0.3 ng/ml.    Labs:    Component Value Date/Time   K 3.5 02/03/2023 0919   MG 2.0 02/03/2023 0919     Plan: [facility] Calculated CrCl  Dose q12h  []  > 60 ml/min 500 mcg  [x]  40-60 ml/min 250 mcg  []  20-40 ml/min 125 mcg   [x]   Physician selected initial dose within range recommended for patients level of renal function - will monitor for response.  []   Physician selected initial dose outside of range recommended for patients level of renal function - will discuss if the dose should be altered at this time.   Patient has been appropriately anticoagulated with Eliquis.  However, patient took trazodone 12/9 PM.  Discussed with EP and plan on starting Tikosyn this evening after a ~24h washout.  Potassium: K 3.5-3.7:  Hold Tikosyn initiation and give KCl 60 mEq po x1 and repeat BMET 2hr after dose - repeat appropriate dose if K < 4    Magnesium: Mg >2: Appropriate to initiate Tikosyn, no replacement needed     Thank you for allowing pharmacy to participate in this patient's care   Trixie Rude, PharmD Clinical Pharmacist 02/03/2023  1:02 PM

## 2023-02-03 NOTE — Progress Notes (Signed)
Primary Care Physician: Benita Stabile, MD Primary Cardiologist: Verne Carrow, MD Electrophysiologist: Lanier Prude, MD  Referring Physician: Dr Lalla Brothers [facility]: Dr Lonn [place] Catherine Mays is a 57 y.o. female with a history of CAD (s/p STEMI in 08/2015), chronic HFrEF s/p St. Jude ICD in place (recent malfunctioning of RV ICD lead and replacement along with ICD generator replacement in 03/2022), history of GIB, mitral regurgitation, HLD, Stage 3 CKD and PVC's (prior PVC ablation in 03/2017), atrial fibrillation who presents for follow up in the [facility].    Patient was admitted 01/16/23 with acute on chronic CHF and afib with RVR. Her sotalol was discontinued with plan for dofetilide as a bridge to ablation. Echo at that time showed EF 25-30%.   Patient is on Eliquis for a CHADS2VASC score of 3.  On follow up today, patient presents for dofetilide admission. She is in SR today but has noted on and off afib since her last visit. She denies any missed doses of anticoagulation.   Today, she denies symptoms of chest pain, shortness of breath, orthopnea, PND, lower extremity edema, dizziness, presyncope, syncope, snoring, daytime somnolence, bleeding, or neurologic sequela. The patient is tolerating medications without difficulties and is otherwise without complaint today.    Atrial Fibrillation Risk Factors:  she does not have symptoms or diagnosis of sleep apnea. she does not have a history of rheumatic fever. The patient does have a history of early familial atrial fibrillation or other arrhythmias.  Atrial Fibrillation Management history:  Previous antiarrhythmic drugs: amiodarone, sotalol  Previous cardioversions: none Previous ablations: none Anticoagulation history: Eliquis  ROS- All systems are reviewed and negative except as per the HPI above.  Past Medical History:  Diagnosis Date   AICD (automatic cardioverter/defibrillator) present     St. Jude   Anemia    CAD in native artery    a. late recognition of presentation of STEMI 08/2015 s/p DES to LAD, mild residual mRCA.   Chronic systolic CHF (congestive heart failure) (HCC)    CKD (chronic kidney disease), stage III (HCC)    Depression    Hypertension    Hypotension    a. preventing med titration for HF.   Hypothyroidism 09/24/2015   Ischemic cardiomyopathy    Myocardial infarction [facility]) 08/2015   PAF (paroxysmal atrial fibrillation) (HCC) 09/24/2015   a. dx at time of STEMI 08/2015.   Pre-diabetes    Presence of permanent cardiac pacemaker    patient has ICD   PVC's (premature ventricular contractions)     Current Outpatient Medications  Medication Sig Dispense Refill   acetaminophen (TYLENOL) 500 MG tablet Take 1,000 mg by mouth every 6 (six) hours as needed for mild pain.     buPROPion (WELLBUTRIN XL) 300 MG 24 hr tablet Take 300 mg by mouth daily.     Calcium Carb-Cholecalciferol (CALCIUM + D3 PO) Take 1 tablet by mouth every morning.     Coenzyme Q10 (COQ-10) 200 MG CAPS Take 200 mg by mouth daily.     dexlansoprazole (DEXILANT) 60 MG capsule TAKE 1 CAPSULE(60 MG) BY MOUTH DAILY 90 capsule 3   ELIQUIS 5 MG TABS tablet TAKE 1 TABLET(5 MG) BY MOUTH TWICE DAILY 180 tablet 3   ferrous sulfate 325 (65 FE) MG EC tablet Take 325 mg by mouth daily.     fexofenadine (ALLEGRA) 180 MG tablet Take 180 mg by mouth daily.     furosemide (LASIX) 20 MG tablet Take 2  tablets as needed in case of weight gain 2 to 3 lbs in 24 hrs or 5 lbs in 7 days until weight back to baseline. 90 tablet 3   nitroGLYCERIN (NITROSTAT) 0.4 MG SL tablet PLACE 1 TABLET UNDER THE TONGUE EVERY 5 MINUTES AS NEEDED FOR CHEST PAIN 25 tablet 3   rosuvastatin (CRESTOR) 20 MG tablet Take 1 tablet (20 mg total) by mouth daily. 90 tablet 3   sertraline (ZOLOFT) 25 MG tablet Take 1 tablet (25 mg total) by mouth daily. 30 tablet 0   spironolactone (ALDACTONE) 25 MG tablet TAKE 1 TABLET(25 MG) BY MOUTH DAILY  30 tablet 11   thyroid (ARMOUR) 120 MG tablet Take 120 mg by mouth daily before breakfast.     triamcinolone (NASACORT) 55 MCG/ACT AERO nasal inhaler Place 2 sprays into the nose daily.      zolpidem (AMBIEN) 5 MG tablet Take 5 mg by mouth at bedtime.     No current facility-administered medications for this encounter.    Physical Exam: BP 100/70   Pulse 70   Ht 5\' 9"  (1.753 m)   Wt 71.9 kg   BMI 23.42 kg/m   GEN: Well nourished, well developed in no acute distress NECK: No JVD; No carotid bruits CARDIAC: Regular rate and rhythm with occasional ectopy, no murmurs, rubs, gallops RESPIRATORY:  Clear to auscultation without rales, wheezing or rhonchi  ABDOMEN: Soft, non-tender, non-distended EXTREMITIES:  No edema; No deformity    Wt Readings from Last 3 Encounters:  02/03/23 71.9 kg  01/21/23 70.4 kg  01/19/23 68.7 kg     EKG today demonstrates  SR, LBBB, PVCs Vent. rate 70 BPM PR interval 190 ms QRS duration 132 ms QT/QTcB 414/447 ms   Echo 11/20/22 demonstrated   1. The anterior, apical, inferoapical and anteroseptal walls are thinned  and akinetic. No LV thrombus by Definity contrast. Left ventricular  ejection fraction, by estimation, is 25 to 30%. The left ventricle has  severely decreased function. The left ventricle demonstrates regional wall motion abnormalities (see scoring diagram/findings for description). The left ventricular internal cavity size was mildly dilated. Left ventricular diastolic parameters are consistent with Grade I diastolic dysfunction (impaired relaxation).   2. Right ventricular systolic function is mildly reduced. The right  ventricular size is normal. There is normal pulmonary artery systolic  pressure. The estimated right ventricular systolic pressure is 28.8 mmHg.   3. Left atrial size was moderately dilated.   4. The mitral valve is abnormal. Mild mitral valve regurgitation.   5. The aortic valve is tricuspid. Aortic valve regurgitation  is not  visualized.   6. The inferior vena cava is normal in size with greater than 50%  respiratory variability, suggesting right atrial pressure of 3 mmHg.   Comparison(s): No significant change from prior study. 05/08/2021: LVEF  25-30%, akinesis inferoseptal/anteroseptal, apical lateral wall and apex.    CHA2DS2-VASc Score = 3  The patient's score is based upon: CHF History: 1 HTN History: 0 Diabetes History: 0 Stroke History: 0 Vascular Disease History: 1 Age Score: 0 Gender Score: 1       ASSESSMENT AND PLAN: Paroxysmal Atrial Fibrillation (ICD10:  I48.0) The patient's CHA2DS2-VASc score is 3, indicating a 3.2% annual risk of stroke.   Previously failed amiodarone (intolerable side effects) and sotalol (ineffective) Patient presents for dofetilide admission. Continue Eliquis 5 mg BID, states no missed doses in the last 3 weeks. No recent benadryl use PharmD has screened medications QTc in SR 447 ms in  setting of LBBB Labs today show creatinine at 1.33, K+ 3.5 and mag 2.0, CrCl calculated at 53 mL/min  Secondary Hypercoagulable State (ICD10:  D68.69) The patient is at significant risk for stroke/thromboembolism based upon her CHA2DS2-VASc Score of 3.  Continue Apixaban (Eliquis).   CAD S/p STEMI 2017 No anginal symptoms  Chronic HFrEF Ischemic CM, s/p ICD EF 25% NYHA class II GDMT limited by hypotension and yeast infections Followed in [facility] Fluid status appears stable   To be admitted later today once a bed becomes available.       Jorja Loa PA-C Afib Clinic [facility] [address] [place], [place] [address] [phone]

## 2023-02-03 NOTE — Plan of Care (Signed)
  Problem: Education: Goal: Knowledge of General Education information will improve Description Including pain rating scale, medication(s)/side effects and non-pharmacologic comfort measures Outcome: Progressing   Problem: Clinical Measurements: Goal: Ability to maintain clinical measurements within normal limits will improve Outcome: Progressing Goal: Will remain free from infection Outcome: Progressing Goal: Cardiovascular complication will be avoided Outcome: Progressing   Problem: Safety: Goal: Ability to remain free from injury will improve Outcome: Progressing

## 2023-02-04 DIAGNOSIS — I480 Paroxysmal atrial fibrillation: Secondary | ICD-10-CM | POA: Diagnosis not present

## 2023-02-04 LAB — BASIC METABOLIC PANEL
Anion gap: 4 — ABNORMAL LOW (ref 5–15)
BUN: 12 mg/dL (ref 6–20)
CO2: 25 mmol/L (ref 22–32)
Calcium: 8.5 mg/dL — ABNORMAL LOW (ref 8.9–10.3)
Chloride: 107 mmol/L (ref 98–111)
Creatinine, Ser: 0.97 mg/dL (ref 0.44–1.00)
GFR, Estimated: >60 mL/min (ref >60)
Glucose, Bld: 106 mg/dL — ABNORMAL HIGH (ref 70–99)
Potassium: 4.3 mmol/L (ref 3.5–5.1)
Sodium: 136 mmol/L (ref 135–145)

## 2023-02-04 LAB — MAGNESIUM: Magnesium: 1.9 mg/dL (ref 1.7–2.4)

## 2023-02-04 MED ORDER — TRAZODONE HCL 50 MG PO TABS
25.0000 mg | ORAL_TABLET | Freq: Every day | ORAL | Status: DC
  Administered 2023-02-04 – 2023-02-05 (×2): 25 mg via ORAL
  Filled 2023-02-04 (×2): qty 1

## 2023-02-04 MED ORDER — MAGNESIUM SULFATE 2 GM/50ML IV SOLN
2.0000 g | Freq: Once | INTRAVENOUS | Status: AC
  Administered 2023-02-04: 2 g via INTRAVENOUS
  Filled 2023-02-04: qty 50

## 2023-02-04 NOTE — Plan of Care (Signed)
  Problem: Education: Goal: Knowledge of General Education information will improve Description: Including pain rating scale, medication(s)/side effects and non-pharmacologic comfort measures Outcome: Progressing   Problem: Health Behavior/Discharge Planning: Goal: Ability to manage health-related needs will improve Outcome: Progressing   Problem: Clinical Measurements: Goal: Ability to maintain clinical measurements within normal limits will improve Outcome: Progressing Goal: Will remain free from infection Outcome: Progressing Goal: Diagnostic test results will improve Outcome: Progressing Goal: Respiratory complications will improve Outcome: Progressing Goal: Cardiovascular complication will be avoided Outcome: Progressing   Problem: Activity: Goal: Risk for activity intolerance will decrease Outcome: Progressing   Problem: Nutrition: Goal: Adequate nutrition will be maintained Outcome: Progressing   Problem: Coping: Goal: Level of anxiety will decrease Outcome: Progressing    Problem: Pain Management: Goal: General experience of comfort will improve Outcome: Progressing   Problem: Safety: Goal: Ability to remain free from injury will improve Outcome: Progressing   Problem: Skin Integrity: Goal: Risk for impaired skin integrity will decrease Outcome: Progressing   Problem: Education: Goal: Knowledge of disease or condition will improve Outcome: Progressing Goal: Understanding of medication regimen will improve Outcome: Progressing Goal: Individualized Educational Video(s) Outcome: Progressing   Problem: Cardiac: Goal: Ability to achieve and maintain adequate cardiopulmonary perfusion will improve Outcome: Progressing   Problem: Health Behavior/Discharge Planning: Goal: Ability to safely manage health-related needs after discharge will improve Outcome: Progressing

## 2023-02-04 NOTE — Progress Notes (Signed)
Electrophysiology Rounding Note  Patient Name: Catherine Mays Date of Encounter: 02/04/2023  Primary Cardiologist: Verne Carrow, MD  Electrophysiologist: Lanier Prude, MD    Subjective   Pt remains in NSR on Tikosyn 250 mcg BID   QTc from EKG last pm shows stable QTc at  The patient is doing well today.  At this time, the patient denies chest pain, shortness of breath, or any new concerns.  Inpatient Medications    Scheduled Meds:  apixaban  5 mg Oral BID   buPROPion  300 mg Oral Daily   dofetilide  250 mcg Oral BID   ferrous sulfate  325 mg Oral Daily   loratadine  10 mg Oral Daily   metoprolol succinate  25 mg Oral QHS   pantoprazole  40 mg Oral Daily   pneumococcal 20-valent conjugate vaccine  0.5 mL Intramuscular Tomorrow-1000   rosuvastatin  20 mg Oral Daily   sertraline  25 mg Oral Daily   sodium chloride flush  3 mL Intravenous Q12H   spironolactone  25 mg Oral QHS   thyroid  120 mg Oral QAC breakfast   traZODone  25 mg Oral QHS   triamcinolone  2 spray Nasal Daily   Continuous Infusions:  PRN Meds: acetaminophen, furosemide, nitroGLYCERIN, sodium chloride flush   Vital Signs    Vitals:   02/03/23 2001 02/03/23 2250 02/04/23 0409 02/04/23 0725  BP: 122/75 107/72 105/77 105/73  Pulse: 76 78 61 69  Resp: 16 18 18 16   Temp: 98.2 F (36.8 C) 98.6 F (37 C) 98.7 F (37.1 C) 98.2 F (36.8 C)  TempSrc: Oral Oral Oral Oral  SpO2: 100% 99% 94% 98%  Weight:      Height:        Intake/Output Summary (Last 24 hours) at 02/04/2023 0805 Last data filed at 02/04/2023 0757 Gross per 24 hour  Intake 472 ml  Output 440 ml  Net 32 ml   Filed Weights   02/03/23 1245  Weight: 71.9 kg    Physical Exam    GEN- NAD, A&O x 3. Normal affect.  Lungs- CTAB, Normal effort.  Heart- Irregularly irregular rate and rhythm. No M/G/R GI- Soft, NT, ND Extremities- No clubbing, cyanosis, or edema Skin- no rash or lesion  Labs    CBC No  results for input(s): "WBC", "NEUTROABS", "HGB", "HCT", "MCV", "PLT" in the last 72 hours. Basic Metabolic Panel Recent Labs    16/10/96 0919 02/03/23 1553 02/04/23 0418  NA 139 140 136  K 3.5 4.5 4.3  CL 105 104 107  CO2 25 28 25   GLUCOSE 140* 148* 106*  BUN 17 16 12   CREATININE 1.33* 1.13* 0.97  CALCIUM 8.8* 8.7* 8.5*  MG 2.0  --  1.9    Telemetry    SR w PVCs, 60-80s PVC burden waxes and wanes  (personally reviewed)  Patient Profile     Catherine Mays is a 57 y.o. female with a past medical history significant for parox atrial fibrillation, CAD s/p STEMI, HFrEF s/p St. 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  They were admitted for tikosyn load.   Assessment & Plan    #) Parox atrial fibrillation Pt remains in NSR on Tikosyn 250 mcg BID  Continue Eliquis Creatinine, ser  0.97 (12/11 0418) Magnesium  1.9 (12/11 0418) Potassium4.3 (12/11 0418) Supplement Mg  Plan for home Friday if QTc remains stable.   #) PVCs S/p PVC ablation 2019  Started toprol 25mg  yesterday evening PVC burden highly variable   For questions or updates, please contact [facility] Please consult [email or web address] for contact info under Cardiology/STEMI.  Signed, Sherie Don, NP  02/04/2023, 8:05 AM

## 2023-02-04 NOTE — Progress Notes (Signed)
Pharmacy: Dofetilide (Tikosyn) - Follow Up Assessment and Electrolyte Replacement  Pharmacy consulted to assist in monitoring and replacing electrolytes in this 57 y.o. female admitted on 02/03/2023 undergoing dofetilide initiation.   Labs:    Component Value Date/Time   K 4.3 02/04/2023 0418   MG 1.9 02/04/2023 0418     Plan: Potassium: K >/= 4: No additional supplementation needed  Magnesium: Mg 1.8-2: Give Mg 2 gm IV x1     Thank you for allowing pharmacy to participate in this patient's care   Harland German, PharmD Clinical Pharmacist **Pharmacist phone directory can now be found on [email or web address] ([email or web address]).  Listed under [facility].

## 2023-02-04 NOTE — Progress Notes (Signed)
Morning EKG reviewed     Shows remains in NSR at 62 bpm with stable QTc at 438 ms.  Continue  Tikosyn 250 mcg BID.   Potassium4.3 (12/11 0418) Magnesium  1.9 (12/11 0418) Creatinine, ser  0.97 (12/11 0418)  Plan for home Friday if QTc remains stable    Sherie Don, NP  02/04/2023 12:58 PM

## 2023-02-05 ENCOUNTER — Encounter (HOSPITAL_COMMUNITY)
Admission: AD | Disposition: A | Discharge status: Home / Self Care | Payer: Self-pay | Source: Ambulatory Visit | Attending: Cardiology

## 2023-02-05 DIAGNOSIS — I480 Paroxysmal atrial fibrillation: Secondary | ICD-10-CM | POA: Diagnosis not present

## 2023-02-05 LAB — BASIC METABOLIC PANEL
Anion gap: 10 (ref 5–15)
BUN: 16 mg/dL (ref 6–20)
CO2: 26 mmol/L (ref 22–32)
Calcium: 8.8 mg/dL — ABNORMAL LOW (ref 8.9–10.3)
Chloride: 102 mmol/L (ref 98–111)
Creatinine, Ser: 1.1 mg/dL — ABNORMAL HIGH (ref 0.44–1.00)
GFR, Estimated: 59 mL/min — ABNORMAL LOW (ref >60)
Glucose, Bld: 112 mg/dL — ABNORMAL HIGH (ref 70–99)
Potassium: 4.5 mmol/L (ref 3.5–5.1)
Sodium: 138 mmol/L (ref 135–145)

## 2023-02-05 LAB — MAGNESIUM: Magnesium: 2.2 mg/dL (ref 1.7–2.4)

## 2023-02-05 SURGERY — CARDIOVERSION (CATH LAB)
Anesthesia: General

## 2023-02-05 NOTE — Plan of Care (Signed)
  Problem: Education: Goal: Knowledge of General Education information will improve Description: Including pain rating scale, medication(s)/side effects and non-pharmacologic comfort measures Outcome: Progressing   Problem: Health Behavior/Discharge Planning: Goal: Ability to manage health-related needs will improve Outcome: Progressing   Problem: Clinical Measurements: Goal: Ability to maintain clinical measurements within normal limits will improve Outcome: Progressing Goal: Will remain free from infection Outcome: Progressing Goal: Diagnostic test results will improve Outcome: Progressing Goal: Respiratory complications will improve Outcome: Progressing Goal: Cardiovascular complication will be avoided Outcome: Progressing   Problem: Activity: Goal: Risk for activity intolerance will decrease Outcome: Progressing   Problem: Nutrition: Goal: Adequate nutrition will be maintained Outcome: Progressing   Problem: Coping: Goal: Level of anxiety will decrease Outcome: Progressing    Problem: Pain Management: Goal: General experience of comfort will improve Outcome: Progressing   Problem: Safety: Goal: Ability to remain free from injury will improve Outcome: Progressing   Problem: Skin Integrity: Goal: Risk for impaired skin integrity will decrease Outcome: Progressing   Problem: Education: Goal: Knowledge of disease or condition will improve Outcome: Progressing Goal: Understanding of medication regimen will improve Outcome: Progressing Goal: Individualized Educational Video(s) Outcome: Progressing   Problem: Cardiac: Goal: Ability to achieve and maintain adequate cardiopulmonary perfusion will improve Outcome: Progressing   Problem: Health Behavior/Discharge Planning: Goal: Ability to safely manage health-related needs after discharge will improve Outcome: Progressing

## 2023-02-05 NOTE — Progress Notes (Signed)
Pharmacy: Dofetilide (Tikosyn) - Follow Up Assessment and Electrolyte Replacement  Pharmacy consulted to assist in monitoring and replacing electrolytes in this 57 y.o. female admitted on 02/03/2023 undergoing dofetilide initiation.   Labs:    Component Value Date/Time   K 4.5 02/05/2023 0328   MG 2.2 02/05/2023 0328     Plan: Potassium: K >/= 4: No additional supplementation needed  Magnesium: Mg > 2: No additional supplementation needed   She has received po potassium since 12/10. No oral potassium supplementation recommended at discharge   Thank you for allowing pharmacy to participate in this patient's care   Harland German, PharmD Clinical Pharmacist **Pharmacist phone directory can now be found on [email or web address] ([email or web address]).  Listed under [facility].  e

## 2023-02-05 NOTE — Progress Notes (Signed)
Morning EKG reviewed     Shows remains in NSR at 74 bpm with stable QTc at 444 ms.  Continue  Tikosyn 250 mcg BID.   Potassium4.5 (12/12 0328) Magnesium  2.2 (12/12 0328) Creatinine, ser  1.10* (12/12 0328)  Plan for home Friday if QTc remains stable   Sherie Don, NP  02/05/2023 12:07 PM

## 2023-02-05 NOTE — Progress Notes (Signed)
Electrophysiology Rounding Note  Patient Name: Catherine Mays Date of Encounter: 02/05/2023  Primary Cardiologist: Verne Carrow, MD  Electrophysiologist: Lanier Prude, MD    Subjective   Pt remains in NSR on Tikosyn 250 mcg BID   QTc from EKG last pm shows stable QTc at  The patient is doing well today.  At this time, the patient denies chest pain, shortness of breath, or any new concerns.  Inpatient Medications    Scheduled Meds:  apixaban  5 mg Oral BID   buPROPion  300 mg Oral Daily   dofetilide  250 mcg Oral BID   ferrous sulfate  325 mg Oral Daily   loratadine  10 mg Oral Daily   metoprolol succinate  25 mg Oral QHS   pantoprazole  40 mg Oral Daily   pneumococcal 20-valent conjugate vaccine  0.5 mL Intramuscular Tomorrow-1000   rosuvastatin  20 mg Oral Daily   sertraline  25 mg Oral Daily   sodium chloride flush  3 mL Intravenous Q12H   spironolactone  25 mg Oral QHS   thyroid  120 mg Oral QAC breakfast   traZODone  25 mg Oral QHS   triamcinolone  2 spray Nasal Daily   Continuous Infusions:  PRN Meds: acetaminophen, furosemide, nitroGLYCERIN, sodium chloride flush   Vital Signs    Vitals:   02/04/23 1507 02/04/23 2033 02/05/23 0500 02/05/23 0802  BP: 99/60 107/65 111/70 108/64  Pulse: 63 69 72   Resp: 16  18   Temp: 97.9 F (36.6 C) 98.3 F (36.8 C) 98.4 F (36.9 C)   TempSrc: Oral Oral Oral   SpO2: 95% 97% 98%   Weight:      Height:        Intake/Output Summary (Last 24 hours) at 02/05/2023 0834 Last data filed at 02/04/2023 2030 Gross per 24 hour  Intake 240 ml  Output --  Net 240 ml   Filed Weights   02/03/23 1245  Weight: 71.9 kg    Physical Exam    GEN- NAD, A&O x 3. Normal affect.  Lungs- CTAB, Normal effort.  Heart- Regular rate and rhythm. No M/G/R GI- Soft, NT, ND Extremities- No clubbing, cyanosis, or edema Skin- no rash or lesion  Labs    CBC No results for input(s): "WBC", "NEUTROABS", "HGB",  "HCT", "MCV", "PLT" in the last 72 hours. Basic Metabolic Panel Recent Labs    19/14/78 0418 02/05/23 0328  NA 136 138  K 4.3 4.5  CL 107 102  CO2 25 26  GLUCOSE 106* 112*  BUN 12 16  CREATININE 0.97 1.10*  CALCIUM 8.5* 8.8*  MG 1.9 2.2    Telemetry    SR, 60-80s   (personally reviewed)  Patient Profile     Catherine Mays is a 57 y.o. female with a past medical history significant for parox atrial fibrillation, CAD s/p STEMI, HFrEF s/p St. 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  They were admitted for tikosyn load.   Assessment & Plan    #) Parox atrial fibrillation Pt remains in NSR on Tikosyn 250 mcg BID  Continue Eliquis Creatinine, ser  1.10* (12/12 0328) Magnesium  2.2 (12/12 0328) Potassium4.5 (12/12 0328) No electrolyte supplementation needed  Plan for home Friday if QTc remains stable.   #) PVCs S/p PVC ablation 2019 Started toprol 25mg  yesterday evening PVC burden highly variable   For questions or updates, please contact [facility] Please consult [email or web address]  for contact info under Cardiology/STEMI.  Signed, Sherie Don, NP  02/05/2023, 8:34 AM

## 2023-02-06 ENCOUNTER — Other Ambulatory Visit (HOSPITAL_COMMUNITY): Payer: Self-pay

## 2023-02-06 DIAGNOSIS — I480 Paroxysmal atrial fibrillation: Secondary | ICD-10-CM | POA: Diagnosis not present

## 2023-02-06 LAB — BASIC METABOLIC PANEL
Anion gap: 6 (ref 5–15)
BUN: 19 mg/dL (ref 6–20)
CO2: 27 mmol/L (ref 22–32)
Calcium: 8.8 mg/dL — ABNORMAL LOW (ref 8.9–10.3)
Chloride: 105 mmol/L (ref 98–111)
Creatinine, Ser: 1.01 mg/dL — ABNORMAL HIGH (ref 0.44–1.00)
GFR, Estimated: >60 mL/min (ref >60)
Glucose, Bld: 95 mg/dL (ref 70–99)
Potassium: 4.5 mmol/L (ref 3.5–5.1)
Sodium: 138 mmol/L (ref 135–145)

## 2023-02-06 LAB — MAGNESIUM: Magnesium: 2.1 mg/dL (ref 1.7–2.4)

## 2023-02-06 MED ORDER — METOPROLOL SUCCINATE ER 25 MG PO TB24
25.0000 mg | ORAL_TABLET | Freq: Every day | ORAL | 5 refills | Status: DC
  Filled 2023-02-06: qty 30, 30d supply, fill #0

## 2023-02-06 MED ORDER — DOFETILIDE 250 MCG PO CAPS
250.0000 ug | ORAL_CAPSULE | Freq: Two times a day (BID) | ORAL | 5 refills | Status: DC
  Filled 2023-02-06 – 2023-06-17 (×2): qty 60, 30d supply, fill #0

## 2023-02-06 NOTE — Progress Notes (Signed)
Pharmacy: Dofetilide (Tikosyn) - Follow Up Assessment and Electrolyte Replacement  Pharmacy consulted to assist in monitoring and replacing electrolytes in this 57 y.o. female admitted on 02/03/2023 undergoing dofetilide initiation.   Labs:    Component Value Date/Time   K 4.5 02/06/2023 0436   MG 2.1 02/06/2023 0436     Plan: Potassium: K >/= 4: No additional supplementation needed  Magnesium: Mg > 2: No additional supplementation needed   She has received po potassium since 12/10. No oral potassium supplementation recommended at discharge   Thank you for allowing pharmacy to participate in this patient's care   Harland German, PharmD Clinical Pharmacist **Pharmacist phone directory can now be found on [email or web address] ([email or web address]).  Listed under [facility].

## 2023-02-06 NOTE — Progress Notes (Addendum)
Telemetry/chart/EKG reviewed QT remains stable Anticipate discharge this afternoon Pt feels well She is on low dose lasix w/ spironolactone Required K+ to get started though none since Mag has been stable No additional supplementation for home planned  PVC burden minimal (waxes/wanes) will continue Toprol at d/c  Off Entresto at her last hospitalization 2/2 hypotension >> deferred to attending cards  Francis Dowse, PA-C

## 2023-02-06 NOTE — Discharge Summary (Signed)
ELECTROPHYSIOLOGY PROCEDURE DISCHARGE SUMMARY    Patient ID: Catherine Mays,  MRN: [id number], DOB/AGE: [birth year] 57 y.o.  Admit date: 02/03/2023 Discharge date: 02/06/2023  Primary Care Physician: Benita Stabile, MD  Primary Cardiologist: Drs. Joellyn Haff Electrophysiologist: Dr. Lalla Brothers  Primary Discharge Diagnosis:  1.  paroxysmal atrial fibrillation status post Tikosyn loading this admission      CHA2DS2Vasc is 3, on Eliquis, appropriately dosed  Secondary Discharge Diagnosis:  CAD ICM Chronic CHF (systolic) Currently well compensated At her last admission, Entresto stopped 2/2 hypotension >> deferred to her out patient primary cardiology HF teams PVCs RBBB CKD (IIIa)  Allergies  Allergen Reactions   Paxil [Paroxetine] Palpitations   Amiodarone Nausea And Vomiting   Chlorhexidine Gluconate Itching   Morphine And Codeine Nausea And Vomiting   Percocet [Oxycodone-Acetaminophen] Nausea And Vomiting   Cefdinir Nausea Only   Zolpidem Other (See Comments)    Doesn't work for patient at all     Procedures This Admission:  1.  Tikosyn loading  Brief HPI: Catherine Mays is a 57 y.o. female with a past medical history as noted above.  They were referred to EP in the outpatient setting for treatment options of atrial fibrillation.  Risks, benefits, and alternatives to Tikosyn were reviewed with the patient who wished to proceed.    Hospital Course:  The patient was admitted and Tikosyn was initiated.  Renal function and electrolytes were followed during the hospitalization.  The patient's QTc remained stable.  O She arrived in and maintained SR through her admission.  PVCs burden waxed/waned, though overall appears low. On the day of discharge, she feels well, was examined by Dr. Lalla Brothers who considered the patient stable for discharge to home.  Follow-up has been arranged with the AFib clinic in 1 week and the EP team in 4 weeks.   Tikosyn teaching was  completed No new or additional electrolyte replacement for home Adding Toprol for her PVCs  Physical Exam: Vitals:   02/05/23 0500 02/05/23 0802 02/05/23 2019 02/06/23 0515  BP: 111/70 108/64 102/68 103/71  Pulse: 72   65  Resp: 18   18  Temp: 98.4 F (36.9 C)  98.1 F (36.7 C) 98.6 F (37 C)  TempSrc: Oral  Oral Oral  SpO2: 98%     Weight:      Height:         GEN- The patient is well appearing, alert and oriented x 3 today.   HEENT: normocephalic, atraumatic; sclera clear, conjunctiva pink; hearing intact; oropharynx clear; neck supple, no JVP Lymph- no cervical lymphadenopathy Lungs-  CTA b/l, normal work of breathing.  No wheezes, rales, rhonchi Heart- RRR, no murmurs, rubs or gallops, PMI not laterally displaced GI- soft, non-tender, non-distended Extremities- no clubbing, cyanosis, or edema MS- no significant deformity or atrophy Skin- warm and dry, no rash or lesion Psych- euthymic mood, full affect Neuro- strength and sensation are intact   Labs:   Lab Results  Component Value Date   WBC 7.0 01/17/2023   HGB 12.7 01/17/2023   HCT 39.3 01/17/2023   MCV 89.9 01/17/2023   PLT 161 01/17/2023    Recent Labs  Lab 02/06/23 0436  NA 138  K 4.5  CL 105  CO2 27  BUN 19  CREATININE 1.01*  CALCIUM 8.8*  GLUCOSE 95     Discharge Medications:  Allergies as of 02/06/2023       Reactions   Paxil [paroxetine] Palpitations  Amiodarone Nausea And Vomiting   Chlorhexidine Gluconate Itching   Morphine And Codeine Nausea And Vomiting   Percocet [oxycodone-acetaminophen] Nausea And Vomiting   Cefdinir Nausea Only   Zolpidem Other (See Comments)   Doesn't work for patient at all        Medication List     STOP taking these medications    zolpidem 5 MG tablet Commonly known as: AMBIEN       TAKE these medications    acetaminophen 500 MG tablet Commonly known as: TYLENOL Take 1,000 mg by mouth every 6 (six) hours as needed for mild pain.    buPROPion 300 MG 24 hr tablet Commonly known as: WELLBUTRIN XL Take 300 mg by mouth daily.   CALCIUM + D3 PO Take 1 tablet by mouth every morning.   CoQ-10 200 MG Caps Take 200 mg by mouth daily.   dexlansoprazole 60 MG capsule Commonly known as: DEXILANT TAKE 1 CAPSULE(60 MG) BY MOUTH DAILY   dofetilide 250 MCG capsule Commonly known as: TIKOSYN Take 1 capsule (250 mcg total) by mouth 2 (two) times daily.   Eliquis 5 MG Tabs tablet Generic drug: apixaban TAKE 1 TABLET(5 MG) BY MOUTH TWICE DAILY   ferrous sulfate 325 (65 FE) MG EC tablet Take 325 mg by mouth daily.   fexofenadine 180 MG tablet Commonly known as: ALLEGRA Take 180 mg by mouth daily.   furosemide 20 MG tablet Commonly known as: LASIX Take 2 tablets as needed in case of weight gain 2 to 3 lbs in 24 hrs or 5 lbs in 7 days until weight back to baseline. What changed:  how much to take how to take this when to take this additional instructions   metoprolol succinate 25 MG 24 hr tablet Commonly known as: TOPROL-XL Take 1 tablet (25 mg total) by mouth at bedtime.   nitroGLYCERIN 0.4 MG SL tablet Commonly known as: NITROSTAT PLACE 1 TABLET UNDER THE TONGUE EVERY 5 MINUTES AS NEEDED FOR CHEST PAIN   rosuvastatin 20 MG tablet Commonly known as: CRESTOR Take 1 tablet (20 mg total) by mouth daily.   sertraline 25 MG tablet Commonly known as: ZOLOFT Take 1 tablet (25 mg total) by mouth daily.   spironolactone 25 MG tablet Commonly known as: ALDACTONE TAKE 1 TABLET(25 MG) BY MOUTH DAILY   thyroid 120 MG tablet Commonly known as: ARMOUR Take 120 mg by mouth daily before breakfast.   traZODone 50 MG tablet Commonly known as: DESYREL Take 25 mg by mouth at bedtime. Notes to patient: OK to continue with your Dofetilide (Tikosyn)   triamcinolone 55 MCG/ACT Aero nasal inhaler Commonly known as: NASACORT Place 2 sprays into the nose daily.        Disposition: Home Discharge Instructions      Diet - low sodium heart healthy   Complete by: As directed    Increase activity slowly   Complete by: As directed         Duration of Discharge Encounter: Greater than 30 minutes including physician time.  Norma Fredrickson, PA-C 02/06/2023 10:56 AM

## 2023-02-06 NOTE — Plan of Care (Signed)
  Problem: Education: Goal: Knowledge of General Education information will improve Description: Including pain rating scale, medication(s)/side effects and non-pharmacologic comfort measures Outcome: Completed/Met   Problem: Health Behavior/Discharge Planning: Goal: Ability to manage health-related needs will improve Outcome: Completed/Met   Problem: Clinical Measurements: Goal: Ability to maintain clinical measurements within normal limits will improve Outcome: Completed/Met Goal: Will remain free from infection Outcome: Completed/Met Goal: Diagnostic test results will improve Outcome: Completed/Met Goal: Respiratory complications will improve Outcome: Completed/Met Goal: Cardiovascular complication will be avoided Outcome: Completed/Met   Problem: Activity: Goal: Risk for activity intolerance will decrease Outcome: Completed/Met   Problem: Nutrition: Goal: Adequate nutrition will be maintained Outcome: Completed/Met   Problem: Coping: Goal: Level of anxiety will decrease Outcome: Completed/Met   Problem: Pain Management: Goal: General experience of comfort will improve Outcome: Completed/Met   Problem: Safety: Goal: Ability to remain free from injury will improve Outcome: Completed/Met   Problem: Skin Integrity: Goal: Risk for impaired skin integrity will decrease Outcome: Completed/Met   Problem: Education: Goal: Knowledge of disease or condition will improve Outcome: Completed/Met Goal: Understanding of medication regimen will improve Outcome: Completed/Met Goal: Individualized Educational Video(s) Outcome: Completed/Met   Problem: Cardiac: Goal: Ability to achieve and maintain adequate cardiopulmonary perfusion will improve Outcome: Completed/Met   Problem: Health Behavior/Discharge Planning: Goal: Ability to safely manage health-related needs after discharge will improve Outcome: Completed/Met

## 2023-02-09 ENCOUNTER — Ambulatory Visit (HOSPITAL_COMMUNITY)
Admission: RE | Admit: 2023-02-09 | Discharge: 2023-02-09 | Disposition: A | Discharge status: Home / Self Care | Payer: BC Managed Care – PPO | Source: Ambulatory Visit | Attending: Family Medicine

## 2023-02-09 ENCOUNTER — Encounter (HOSPITAL_COMMUNITY): Payer: Self-pay

## 2023-02-09 VITALS — BP 102/66 | HR 63 | Wt 155.6 lb

## 2023-02-09 DIAGNOSIS — I252 Old myocardial infarction: Secondary | ICD-10-CM | POA: Insufficient documentation

## 2023-02-09 DIAGNOSIS — I447 Left bundle-branch block, unspecified: Secondary | ICD-10-CM | POA: Diagnosis not present

## 2023-02-09 DIAGNOSIS — D500 Iron deficiency anemia secondary to blood loss (chronic): Secondary | ICD-10-CM | POA: Diagnosis not present

## 2023-02-09 DIAGNOSIS — Z7901 Long term (current) use of anticoagulants: Secondary | ICD-10-CM | POA: Diagnosis not present

## 2023-02-09 DIAGNOSIS — I2510 Atherosclerotic heart disease of native coronary artery without angina pectoris: Secondary | ICD-10-CM | POA: Insufficient documentation

## 2023-02-09 DIAGNOSIS — I493 Ventricular premature depolarization: Secondary | ICD-10-CM | POA: Insufficient documentation

## 2023-02-09 DIAGNOSIS — I340 Nonrheumatic mitral (valve) insufficiency: Secondary | ICD-10-CM | POA: Insufficient documentation

## 2023-02-09 DIAGNOSIS — Z95810 Presence of automatic (implantable) cardiac defibrillator: Secondary | ICD-10-CM | POA: Diagnosis not present

## 2023-02-09 DIAGNOSIS — Z955 Presence of coronary angioplasty implant and graft: Secondary | ICD-10-CM | POA: Diagnosis not present

## 2023-02-09 DIAGNOSIS — I255 Ischemic cardiomyopathy: Secondary | ICD-10-CM | POA: Diagnosis not present

## 2023-02-09 DIAGNOSIS — Z79899 Other long term (current) drug therapy: Secondary | ICD-10-CM | POA: Insufficient documentation

## 2023-02-09 DIAGNOSIS — E785 Hyperlipidemia, unspecified: Secondary | ICD-10-CM | POA: Insufficient documentation

## 2023-02-09 DIAGNOSIS — I480 Paroxysmal atrial fibrillation: Secondary | ICD-10-CM | POA: Diagnosis not present

## 2023-02-09 DIAGNOSIS — Z8719 Personal history of other diseases of the digestive system: Secondary | ICD-10-CM

## 2023-02-09 DIAGNOSIS — R0600 Dyspnea, unspecified: Secondary | ICD-10-CM | POA: Insufficient documentation

## 2023-02-09 DIAGNOSIS — I5022 Chronic systolic (congestive) heart failure: Secondary | ICD-10-CM | POA: Diagnosis not present

## 2023-02-09 LAB — CBC
HCT: 38.6 % (ref 36.0–46.0)
Hemoglobin: 12.1 g/dL (ref 12.0–15.0)
MCH: 28.9 pg (ref 26.0–34.0)
MCHC: 31.3 g/dL (ref 30.0–36.0)
MCV: 92.3 fL (ref 80.0–100.0)
Platelets: 177 10*3/uL (ref 150–400)
RBC: 4.18 MIL/uL (ref 3.87–5.11)
RDW: 13.4 % (ref 11.5–15.5)
WBC: 5.3 10*3/uL (ref 4.0–10.5)
nRBC: 0 % (ref 0.0–0.2)

## 2023-02-09 LAB — BASIC METABOLIC PANEL
Anion gap: 7 (ref 5–15)
BUN: 19 mg/dL (ref 6–20)
CO2: 26 mmol/L (ref 22–32)
Calcium: 8.9 mg/dL (ref 8.9–10.3)
Chloride: 104 mmol/L (ref 98–111)
Creatinine, Ser: 1.17 mg/dL — ABNORMAL HIGH (ref 0.44–1.00)
GFR, Estimated: 54 mL/min — ABNORMAL LOW (ref >60)
Glucose, Bld: 118 mg/dL — ABNORMAL HIGH (ref 70–99)
Potassium: 4.1 mmol/L (ref 3.5–5.1)
Sodium: 137 mmol/L (ref 135–145)

## 2023-02-09 LAB — MAGNESIUM: Magnesium: 2 mg/dL (ref 1.7–2.4)

## 2023-02-09 LAB — BRAIN NATRIURETIC PEPTIDE: B Natriuretic Peptide: 483.2 pg/mL — ABNORMAL HIGH (ref 0.0–100.0)

## 2023-02-09 NOTE — Progress Notes (Addendum)
Advanced Heart Failure Clinic Note  PCP: Dr. Margo Aye HF Cardiology: Dr. Shirlee Latch  Catherine Mays is a 57 y.o. with history of CAD s/p anterior STEMI with DES to LAD, ischemic cardiomyopathy, mitral regurgitation, and paroxysmal atrial fibrillation.  She was admitted in 7/17 with anterior STEMI.  She had a late presentation to the cath lab.  Echo in 10/17 showed EF about 25% with LAD-territory wall motion abnormalities.  She had peri-MI atrial fibrillation and was started on Xarelto.  She was put on amiodarone.   After discharge, she continued to feel poorly.  She developed nausea, anorexia, and significant fatigue.  She was admitted for evaluation in 10/17 given concern for low output heart failure.  However, stopping amiodarone essentially resolved her symptoms.  She had RHC showing preserved cardiac output but the PCWP was high due to prominent v-waves, presumably from significant mitral regurgitation.  Of note, she was in atrial fibrillation for a time during this admission.   She had St Jude ICD placed.  She is back at work for the [employer].   TEE in 8/18 to evaluate the mitral valve showed EF 30%, moderate MR (probably functional).    Patient was seen by Dr. Elberta Fortis and noted to have very frequent PVCs on device interrogation.  Holter in 12/18 showed 41% PVCs.  She was started on sotalol 80 mg bid and titrated up to 120 mg bid.  Coreg was stopped with the addition of sotalol and losartan was decreased to 12.5 mg daily due to low BP.  She had PVC ablation in 2/19 but PVCs recurred. Sotalol was increased to 160 mg bid.   Echo 8/19 showed EF 30% with regional wall motion abnormalities, normal RV size and systolic function, moderate MR.   She was admitted in 10/19 with chest pain.  Cath showed patent LAD stent and no obstructive CAD.  She was found to be anemic with Fe deficiency (hgb down to 8).  FOBT+. She has had IV Fe and blood transfusion.  EGD and colonoscopy this year were  fairly unremarkable.  She had diverticulosis.  Capsule endoscopy negative in 9/21.   Echo in 1/22 showed EF 25-30%, wall motion abnormalities in LAD distribution, mild-moderate MR, normal RV.    Admitted 10/22 with anemia, hemoglobin of 8.6 (baseline around 10-11). She was transfused 1 unit of PRBC on 11/29/2020. GI was consulted and she underwent EGD on 11/30/2020 which showed no source of bleeding.  Capsule endoscopy on 12/01/2020 showed blood in the mid/distal small bowel and proximal colon.  Colonoscopy on 12/02/2020 showed blood in the terminal ileum but no clear source.  It is suspected that the source of bleeding is in the distal small bowel that is not able to be reached via routine colonoscope.  GI asked IR to consider an arteriogram to localize and treat the source of her bleeding. CT done and felt best option was DBE with Dr. Theodore Demark at [place]. Double balloon endoscopy was done in 12/22, no definite cause of GI bleeding was found.   Echo in 3/23 showed EF 25-30%, apical akinesis, mild MR, normal RV, normal IVC. Echo was done today and reviewed, EF 25% with LAD territory akinesis, mild LV dilation, mild MR, RV normal, IVC normal.   Admitted 11/24 with hypotension and rate controlled AF. + orthostatics. GDMT & sotalol held. Spontaneously converted to NSR. Planned to hold on sotalol and start on Tikosyn inpatient the following week. Planned admission 12/24 for Tikosyn loading. Had waxing/waning PVC burden,  which improved with addition of low dose Toprol. She was discharged home, remained off Entresto with low BP.  Today she returns for HF follow up. Overall feeling fine. She has dyspnea walking up steps but otherwise no issues. She is chronically dizzy, but generally feels better off Entresto. Denies palpitations, abnormal bleeding, CP, edema, or PND/Orthopnea. Appetite ok. No fever or chills. Weight at home 155 pounds. Taking all medications. Planning AF ablation with Dr. Lalla Brothers in 05/11/23.  ECG  (personally reviewed): NSR LBBB, QRS 166 msec, QTc 476 msec  St Jude device interrogation:  1 episode of NSVT on 01/16/23, < 1% VP, volume recently elevated but thoracic impedence now stabilized.  Labs (1/19): K 4.4, creatinine 1.14 Labs (2/19): LDL 97 (off atorvastatin), HDL 56 Labs (5/19): K 4.3, creatinine 1.1 Labs (10/19): K 4.2, creatinine 1.2, LDL 70, hgb 11.8 Labs (11/19): K 4.9, creatinine 1.17, hgb 11.8, LDL 68, low TSH  Labs (9/20): K 4.4, creatinine 1.34, LDL 52 Labs (12/20): K 4.6, creatinine 1.19 Labs (6/21): LDL 73, HDL 62, hgb 11.9, K 4.2, creatinine 1.14 Labs (7/21): hgb 11.2, K 3.8, creatinine 1.12 Labs (10/21): K 4.5, creatinine 1.38, LDL 53, HDL 50 Labs (1/22): K 4.9, creatinine 0.99 Labs (2/22): hgb 11.6, Fe studies normal Labs (5/22): K 5, creatinine 1.05, hgb 11.8 Labs (10/22): K 3.9, creatinine 1.02, hgb 7.1 Labs (11/22): hgb 11.9, K 3.7, creatinine 1.02, BNP 347 Labs (3/23): hgb 13.1 Labs (4/23): K 4.1, creatinine 1.16, pro-BNP 2121 Labs (5/23): LDL 71, TGs 74, K 4.1, creatinine 0.98 Labs (8/23): LDL 62, NT-proBNP 2843, K 3.8, creatinine 1.02 Labs (3/24): LDL 79, TGs 102, K 4.5, creatinine 1.07 Labs (6/24): K 4, creatinine 1.21, BNP 304 Labs (8/24): LDL 55 Labs (12/24): K 4.5, creatinine 1.01  PMH:  1. CAD: Anterior STEMI in 7/17 with late presentation to the cath lab. She had DES to proximal LAD.  No significant disease in other vessels.  - LHC (10/19): Patent LAD stent, no obstructive disease.  2. Chronic systolic CHF: Ischemic cardiomyopathy.  - Echo 10/17 with EF 25%, akinesis of the mid-apical anteroseptal and anterior walls, apical dyskinesis, moderate to severe MR with incomplete coaptation.  - RHC (10/17): mean RA 4, PA 59/17 mean 38, mean PCWP 27 with prominent V waves suggesting significant MR, CI 3.06, PVR 2 WU.  - ACEI cough.  - Echo (11/17): EF 30-35%, normal RV size and systolic function, severe MR with restriction of posterior leaflet.   -  Echo (8/19): EF 30% with regional wall motion abnormalities, normal RV size and systolic function, moderate MR.  - Echo (12/20): EF 30%, LAD wall motion abnormalities, normal RV, PASP 35, moderate functional MR.  - Echo (1/22): EF 25-30%, wall motion abnormalities in LAD distribution, mild-moderate MR, normal RV.  - Echo (3/23): EF 25-30%, apical akinesis, mild MR, normal RV, normal IVC.  - Echo (9/24): EF 25% with LAD territory akinesis, mild LV dilation, mild MR, RV normal, IVC normal.  3. Atrial fibrillation: Paroxysmal.  Noted 7/17 admission peri-MI, also noted again 10/17 admission. She did not tolerate amiodarone due to nausea/anorexia.  - Started on Tikosyn (12/24) 4. Mitral regurgitation: Moderate to severe on 10/17 echo, incomplete coaptation.  ?Etiology, her MI (anterior) does not typically lead to ischemic MR.  - Echo in 11/17 with severe MR, posterior leaflet restricted.  - TEE (8/18): EF 30%, moderately dilated LV with akinetic septal and anterior walls, moderate functional MR, normal RV size and systolic function.  - Echo (8/19): Moderate  MR - Echo (3/23): Mild MR.  - Echo (9/24): Mild MR. 5. Hyperlipidemia: Myalgias with atorvastatin.  6. PVCs: Holter 12/18 with 41% PVCs.  - PVC ablation in 2/19 with recurrence of PVCs.  7. GI bleeding:  - EGD (4/21): unremarkable.  - Colonoscopy (7/21): Diverticulosis.  - Capsule endoscopy (9/21): negative.  - Double balloon endoscopy at [facility] in 12/22 did not show cause for bleeding.   SH: Married, lives in [place], works for the [employer], no smoking or ETOH.   Family History  Problem Relation Age of Onset   Arrhythmia Mother    Lung cancer Mother        bronchiectasis   Heart failure Father    Heart attack Father    Heart attack Brother    Arrhythmia Brother    Colon cancer Neg Hx    Colon polyps Neg Hx    ROS: All systems reviewed and negative except as per HPI.   Current Outpatient Medications on File Prior to Encounter   Medication Sig Dispense Refill   acetaminophen (TYLENOL) 500 MG tablet Take 1,000 mg by mouth every 6 (six) hours as needed for mild pain.     buPROPion (WELLBUTRIN XL) 300 MG 24 hr tablet Take 300 mg by mouth daily.     Calcium Carb-Cholecalciferol (CALCIUM + D3 PO) Take 1 tablet by mouth every morning.     Coenzyme Q10 (COQ-10) 200 MG CAPS Take 200 mg by mouth daily.     dexlansoprazole (DEXILANT) 60 MG capsule TAKE 1 CAPSULE(60 MG) BY MOUTH DAILY 90 capsule 3   dofetilide (TIKOSYN) 250 MCG capsule Take 1 capsule (250 mcg total) by mouth 2 (two) times daily. 60 capsule 5   ELIQUIS 5 MG TABS tablet TAKE 1 TABLET(5 MG) BY MOUTH TWICE DAILY 180 tablet 3   ferrous sulfate 325 (65 FE) MG EC tablet Take 325 mg by mouth daily.     fexofenadine (ALLEGRA) 180 MG tablet Take 180 mg by mouth daily.     furosemide (LASIX) 20 MG tablet Take 2 tablets as needed in case of weight gain 2 to 3 lbs in 24 hrs or 5 lbs in 7 days until weight back to baseline. (Patient taking differently: Take 40 mg by mouth daily.) 90 tablet 3   metoprolol succinate (TOPROL-XL) 25 MG 24 hr tablet Take 1 tablet (25 mg total) by mouth at bedtime. 30 tablet 5   nitroGLYCERIN (NITROSTAT) 0.4 MG SL tablet PLACE 1 TABLET UNDER THE TONGUE EVERY 5 MINUTES AS NEEDED FOR CHEST PAIN 25 tablet 3   rosuvastatin (CRESTOR) 20 MG tablet Take 1 tablet (20 mg total) by mouth daily. 90 tablet 3   sertraline (ZOLOFT) 25 MG tablet Take 1 tablet (25 mg total) by mouth daily. 30 tablet 0   spironolactone (ALDACTONE) 25 MG tablet TAKE 1 TABLET(25 MG) BY MOUTH DAILY 30 tablet 11   thyroid (ARMOUR) 120 MG tablet Take 120 mg by mouth daily before breakfast.     traZODone (DESYREL) 50 MG tablet Take 25 mg by mouth at bedtime.     triamcinolone (NASACORT) 55 MCG/ACT AERO nasal inhaler Place 2 sprays into the nose daily.      No current facility-administered medications on file prior to encounter.   Wt Readings from Last 3 Encounters:  02/09/23 70.6 kg  (155 lb 9.6 oz)  02/03/23 71.9 kg (158 lb 9.6 oz)  02/03/23 71.9 kg (158 lb 9.6 oz)   BP 102/66   Pulse 63   Wt 70.6  kg (155 lb 9.6 oz)   SpO2 98%   BMI 22.98 kg/m  Physical Exam General:  NAD. No resp difficulty, walked into clinic HEENT: Normal Neck: Supple. No JVD. Carotids 2+ bilat; no bruits. No lymphadenopathy or thryomegaly appreciated. Cor: PMI nondisplaced. Regular rate & rhythm. No rubs, gallops or murmurs. Lungs: Clear Abdomen: Soft, nontender, nondistended. No hepatosplenomegaly. No bruits or masses. Good bowel sounds. Extremities: No cyanosis, clubbing, rash, edema Neuro: Alert & oriented x 3, cranial nerves grossly intact. Moves all 4 extremities w/o difficulty. Affect pleasant.  Assessment/Plan: 1. CAD: S/p anterior MI in 7/17 with DES to RCA.  Cath in 10/19 with no obstructive CAD.  No chest pain.  - She is off Plavix now and taking apixaban 5 mg bid.   - She is tolerating Crestor without myalgias.      2. Chronic systolic CHF: Ischemic cardiomyopathy.  EF 25% on echo in 10/17, 30-35% on echo in 11/17, 30% on TEE in 8/18. She has extensive LAD-territory scar. 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  Echo in 3/23 showed that EF remained 25-30% with LAD-territory scar. Echo 9/24 showed stable EF 25% with LAD territory akinesis, mild LV dilation, mild MR, RV normal, IVC normal.  She is not volume overloaded on exam or by CorVue. Improved NYHA II, dizziness better off Entresto. GDMT limited by soft BP. - Continue Lasix 40 mg daily. BMET/BNP today.  - Continue spironolactone 25 mg daily.   - Continue Toprol XL 25 mg at bedtime. - Off Entresto with hypotension. Consider trial of low-dose losartan in future if BP stable. Will not started today with low BP. - She did not tolerate dapagliflozin due to yeast infections.   - Insurance did not approve her for baroreceptor activation therapy.   - We have discussed cardiac contractility modulation. However, QRS today is significantly wider at 166  msec with LBBB and she would be a candidate for upgrade of device to CRT.  3. Mitral regurgitation: Moderate to severe on 10/17 echo, severe on 11/17 echo.  Underwent TEE in 8/18.  This showed moderate MR that appeared to be functional.  No indication for surgery or Mitraclip. Echo in 3/23 showed mild MR. Echo 9/24 with mild MR.  4. Atrial fibrillation: Unable to tolerate amiodarone due to GI side effects. She failed sotalol. She was started on Tikosyn 12/24, and remains in NSR today.  - Continue Tikosyn.  QTc interval is acceptable on today's ECG.  Check labs and magnesium today. - Continue Eliquis 5 mg bid. No bleeding issues. CBC today. 5. PVCs: Very frequent on 12/18 holter (41% beats), she has had a PVC ablation in 2/19. Due to recurrence of PVCs post-ablation, she was placed on sotalol. Unfortunately, had break through AF, with PVCs. Now off sotalol (and started on Tikosyn for AF).  - Continue Toprol 6. Hyperlipidemia: Tolerating Crestor. Goal LDL < 55.  Good LDL in 8/24.  7. GI bleeding: With anemia.  She had episode in 10/22, has not had overt recurrence. She has been considered for Watchman.   - She wants to hold off on Watchman for now, will to consider with a close recurrence of GI bleeding. CBC today.  Follow up in 4 months with Dr. Kathreen Cornfield [facility] FNP-BC 02/09/2023

## 2023-02-09 NOTE — Patient Instructions (Addendum)
Thank you for coming in today  If you had labs drawn today, any labs that are abnormal the clinic will call you No news is good news  Medications: No changes  Follow up appointments:  Your physician recommends that you schedule a follow-up appointment in:  4 months With Dr. Earlean Shawl will receive a reminder letter in the mail a few months in advance. If you don't receive a letter, please call our office to schedule the follow-up appointment. Please call February to make appointment for April.    Do the following things EVERYDAY: Weigh yourself in the morning before breakfast. Write it down and keep it in a log. Take your medicines as prescribed Eat low salt foods--Limit salt (sodium) to 2000 mg per day.  Stay as active as you can everyday Limit all fluids for the day to less than 2 liters   At the [facility], you and your health needs are our priority. As part of our continuing mission to provide you with exceptional heart care, we have created designated Provider Care Teams. These Care Teams include your primary Cardiologist (physician) and Advanced Practice Providers (APPs- Physician Assistants and Nurse Practitioners) who all work together to provide you with the care you need, when you need it.   You may see any of the following providers on your designated Care Team at your next follow up: Dr Arvilla Meres Dr Marca Ancona Dr. Marcos Eke, NP Robbie Lis, [place] [facility] [place], [place] Brynda Peon, NP Karle Plumber, PharmD   Please be sure to bring in all your medications bottles to every appointment.    Thank you for choosing [facility]  If you have any questions or concerns before your next appointment please send [place] a message through [place] or call our office at [phone].    TO LEAVE A MESSAGE FOR THE NURSE SELECT OPTION 2, PLEASE LEAVE A MESSAGE INCLUDING: YOUR  NAME DATE OF BIRTH CALL BACK NUMBER REASON FOR CALL**this is important as we prioritize the call backs  YOU WILL RECEIVE A CALL BACK THE SAME DAY AS LONG AS YOU CALL BEFORE 4:00 PM

## 2023-02-09 NOTE — Addendum Note (Signed)
Encounter addended by: Jacklynn Ganong, FNP on: 02/09/2023 11:45 AM  Actions taken: Clinical Note Signed

## 2023-02-11 ENCOUNTER — Emergency Department (HOSPITAL_COMMUNITY): Payer: BC Managed Care – PPO

## 2023-02-11 ENCOUNTER — Ambulatory Visit (HOSPITAL_BASED_OUTPATIENT_CLINIC_OR_DEPARTMENT_OTHER)
Admission: RE | Admit: 2023-02-11 | Discharge: 2023-02-11 | Disposition: A | Discharge status: Home / Self Care | Payer: BC Managed Care – PPO | Source: Ambulatory Visit | Attending: Physician Assistant | Admitting: Physician Assistant

## 2023-02-11 ENCOUNTER — Emergency Department (HOSPITAL_COMMUNITY)
Admission: EM | Admit: 2023-02-11 | Discharge: 2023-02-11 | Disposition: A | Discharge status: Home / Self Care | Payer: BC Managed Care – PPO | Attending: Emergency Medicine | Admitting: Emergency Medicine

## 2023-02-11 ENCOUNTER — Other Ambulatory Visit: Payer: Self-pay

## 2023-02-11 ENCOUNTER — Encounter (HOSPITAL_COMMUNITY): Payer: Self-pay | Admitting: Physician Assistant

## 2023-02-11 ENCOUNTER — Encounter (HOSPITAL_COMMUNITY): Payer: Self-pay

## 2023-02-11 VITALS — BP 90/80 | HR 154 | Ht 69.0 in | Wt 154.0 lb

## 2023-02-11 DIAGNOSIS — I2510 Atherosclerotic heart disease of native coronary artery without angina pectoris: Secondary | ICD-10-CM | POA: Insufficient documentation

## 2023-02-11 DIAGNOSIS — Z5181 Encounter for therapeutic drug level monitoring: Secondary | ICD-10-CM

## 2023-02-11 DIAGNOSIS — Z7901 Long term (current) use of anticoagulants: Secondary | ICD-10-CM | POA: Insufficient documentation

## 2023-02-11 DIAGNOSIS — I480 Paroxysmal atrial fibrillation: Secondary | ICD-10-CM | POA: Insufficient documentation

## 2023-02-11 DIAGNOSIS — Z955 Presence of coronary angioplasty implant and graft: Secondary | ICD-10-CM | POA: Insufficient documentation

## 2023-02-11 DIAGNOSIS — I252 Old myocardial infarction: Secondary | ICD-10-CM | POA: Insufficient documentation

## 2023-02-11 DIAGNOSIS — N183 Chronic kidney disease, stage 3 unspecified: Secondary | ICD-10-CM | POA: Insufficient documentation

## 2023-02-11 DIAGNOSIS — I130 Hypertensive heart and chronic kidney disease with heart failure and stage 1 through stage 4 chronic kidney disease, or unspecified chronic kidney disease: Secondary | ICD-10-CM | POA: Insufficient documentation

## 2023-02-11 DIAGNOSIS — D6869 Other thrombophilia: Secondary | ICD-10-CM

## 2023-02-11 DIAGNOSIS — I4891 Unspecified atrial fibrillation: Secondary | ICD-10-CM

## 2023-02-11 DIAGNOSIS — Z79899 Other long term (current) drug therapy: Secondary | ICD-10-CM | POA: Insufficient documentation

## 2023-02-11 DIAGNOSIS — I4819 Other persistent atrial fibrillation: Secondary | ICD-10-CM

## 2023-02-11 DIAGNOSIS — Z95810 Presence of automatic (implantable) cardiac defibrillator: Secondary | ICD-10-CM | POA: Insufficient documentation

## 2023-02-11 DIAGNOSIS — I447 Left bundle-branch block, unspecified: Secondary | ICD-10-CM | POA: Insufficient documentation

## 2023-02-11 DIAGNOSIS — R002 Palpitations: Secondary | ICD-10-CM | POA: Diagnosis present

## 2023-02-11 DIAGNOSIS — R079 Chest pain, unspecified: Secondary | ICD-10-CM

## 2023-02-11 DIAGNOSIS — I5022 Chronic systolic (congestive) heart failure: Secondary | ICD-10-CM | POA: Insufficient documentation

## 2023-02-11 DIAGNOSIS — R000 Tachycardia, unspecified: Secondary | ICD-10-CM | POA: Diagnosis not present

## 2023-02-11 DIAGNOSIS — I255 Ischemic cardiomyopathy: Secondary | ICD-10-CM | POA: Insufficient documentation

## 2023-02-11 DIAGNOSIS — E785 Hyperlipidemia, unspecified: Secondary | ICD-10-CM | POA: Insufficient documentation

## 2023-02-11 LAB — CBC WITH DIFFERENTIAL/PLATELET
Abs Immature Granulocytes: 0.02 10*3/uL (ref 0.00–0.07)
Basophils Absolute: 0.1 10*3/uL (ref 0.0–0.1)
Basophils Relative: 1 %
Eosinophils Absolute: 0.1 10*3/uL (ref 0.0–0.5)
Eosinophils Relative: 1 %
HCT: 40.1 % (ref 36.0–46.0)
Hemoglobin: 12.8 g/dL (ref 12.0–15.0)
Immature Granulocytes: 0 %
Lymphocytes Relative: 22 %
Lymphs Abs: 1.8 10*3/uL (ref 0.7–4.0)
MCH: 29.9 pg (ref 26.0–34.0)
MCHC: 31.9 g/dL (ref 30.0–36.0)
MCV: 93.7 fL (ref 80.0–100.0)
Monocytes Absolute: 0.6 10*3/uL (ref 0.1–1.0)
Monocytes Relative: 8 %
Neutro Abs: 5.4 10*3/uL (ref 1.7–7.7)
Neutrophils Relative %: 68 %
Platelets: 223 10*3/uL (ref 150–400)
RBC: 4.28 MIL/uL (ref 3.87–5.11)
RDW: 13.5 % (ref 11.5–15.5)
WBC: 8 10*3/uL (ref 4.0–10.5)
nRBC: 0 % (ref 0.0–0.2)

## 2023-02-11 LAB — BASIC METABOLIC PANEL
Anion gap: 12 (ref 5–15)
BUN: 18 mg/dL (ref 6–20)
CO2: 23 mmol/L (ref 22–32)
Calcium: 9.3 mg/dL (ref 8.9–10.3)
Chloride: 103 mmol/L (ref 98–111)
Creatinine, Ser: 1.27 mg/dL — ABNORMAL HIGH (ref 0.44–1.00)
GFR, Estimated: 49 mL/min — ABNORMAL LOW (ref >60)
Glucose, Bld: 160 mg/dL — ABNORMAL HIGH (ref 70–99)
Potassium: 4.2 mmol/L (ref 3.5–5.1)
Sodium: 138 mmol/L (ref 135–145)

## 2023-02-11 LAB — BRAIN NATRIURETIC PEPTIDE: B Natriuretic Peptide: 1393.2 pg/mL — ABNORMAL HIGH (ref 0.0–100.0)

## 2023-02-11 LAB — TROPONIN I (HIGH SENSITIVITY)
Troponin I (High Sensitivity): 7 ng/L (ref <18)
Troponin I (High Sensitivity): 13 ng/L (ref <18)

## 2023-02-11 LAB — MAGNESIUM: Magnesium: 2 mg/dL (ref 1.7–2.4)

## 2023-02-11 MED ORDER — ONDANSETRON HCL 4 MG/2ML IJ SOLN
4.0000 mg | Freq: Once | INTRAMUSCULAR | Status: AC
  Administered 2023-02-11: 4 mg via INTRAVENOUS
  Filled 2023-02-11: qty 2

## 2023-02-11 MED ORDER — ETOMIDATE 2 MG/ML IV SOLN
INTRAVENOUS | Status: AC | PRN
  Administered 2023-02-11: 7 mg via INTRAVENOUS

## 2023-02-11 MED ORDER — ETOMIDATE 2 MG/ML IV SOLN
7.0000 mg | Freq: Once | INTRAVENOUS | Status: DC
  Filled 2023-02-11: qty 10

## 2023-02-11 MED ORDER — SODIUM CHLORIDE 0.9 % IV BOLUS
500.0000 mL | Freq: Once | INTRAVENOUS | Status: AC
  Administered 2023-02-11: 500 mL via INTRAVENOUS

## 2023-02-11 NOTE — CV Procedure (Addendum)
     Cardioversion Note  Ixchel Mcguyer [id number] [birth year]  Procedure: DC Cardioversion Indications: atrial fib   Procedure Details Consent: Obtained Time Out: Verified patient identification, verified procedure, site/side was marked, verified correct patient position, special equipment/implants available, Radiology Safety Procedures followed,  medications/allergies/relevent history reviewed, required imaging and test results available.  Performed  The patient has been on adequate anticoagulation.  The patient received IV Etomidate 7 mg IV  for sedation.  Synchronous cardioversion was performed at 200  joules.  The cardioversion was successful     Complications: No apparent complications Patient did tolerate procedure well.   Vesta Mixer, Montez Hageman., MD, [facility] 02/11/2023, 6:42 PM

## 2023-02-11 NOTE — ED Notes (Signed)
This RN gave pt ginger ale to drink per MD Tegeler PO challenge

## 2023-02-11 NOTE — ED Triage Notes (Signed)
Patient arrives from the Afib clinic. Patient sent over for chest pain and EKG. Patient endorses 8/10 chest pain, headache, and shortness of breath. Patient is newly diagnosed with atrial fibrillation around thanksgiving. Takes tikosyn. Patient has history of MI 2017. Patient is alert and oriented x4.

## 2023-02-11 NOTE — Progress Notes (Signed)
Primary Care Physician: Benita Stabile, MD Primary Cardiologist: Verne Carrow, MD Electrophysiologist: Lanier Prude, MD  Referring Physician: Dr Lalla [facility]: Dr Lonn [place] Catherine Mays is a 57 y.o. female with a history of CAD (s/p STEMI in 08/2015), chronic HFrEF s/p St. Jude ICD in place (recent malfunctioning of RV ICD lead and replacement along with ICD generator replacement in 03/2022), history of GIB, mitral regurgitation, HLD, Stage 3 CKD and PVC's (prior PVC ablation in 03/2017), atrial fibrillation who presents for follow up in the [facility].    Patient was admitted 01/16/23 with acute on chronic CHF and afib with RVR. Her sotalol was discontinued with plan for dofetilide as a bridge to ablation. Echo at that time showed EF 25-30%.   Patient is on Eliquis for a CHADS2VASC score of 3.  On follow up today, patient reports she was doing "great" until Monday night when she started having tachypalpitations. Initially, her heart rates were ~100 bpm but today she has felt much worse with chest pain, SOB, and a "squeezing" sensation in her neck. She appears to be in rapid atrial fibrillation today.   Today, she denies symptoms of orthopnea, PND, lower extremity edema, dizziness, presyncope, syncope, snoring, daytime somnolence, bleeding, or neurologic sequela. The patient is tolerating medications without difficulties and is otherwise without complaint today.    Atrial Fibrillation Risk Factors:  she does not have symptoms or diagnosis of sleep apnea. she does not have a history of rheumatic fever. The patient does have a history of early familial atrial fibrillation or other arrhythmias.  Atrial Fibrillation Management history:  Previous antiarrhythmic drugs: amiodarone, sotalol, dofetilide  Previous cardioversions: none Previous ablations: none Anticoagulation history: Eliquis  ROS- All systems are reviewed and negative except as per  the HPI above.  Past Medical History:  Diagnosis Date   AICD (automatic cardioverter/defibrillator) present    St. Jude   Anemia    CAD in native artery    a. late recognition of presentation of STEMI 08/2015 s/p DES to LAD, mild residual mRCA.   Chronic systolic CHF (congestive heart failure) (HCC)    CKD (chronic kidney disease), stage III (HCC)    Depression    Hypertension    Hypotension    a. preventing med titration for HF.   Hypothyroidism 09/24/2015   Ischemic cardiomyopathy    Myocardial infarction [facility]) 08/2015   PAF (paroxysmal atrial fibrillation) (HCC) 09/24/2015   a. dx at time of STEMI 08/2015.   Pre-diabetes    Presence of permanent cardiac pacemaker    patient has ICD   PVC's (premature ventricular contractions)     Current Outpatient Medications  Medication Sig Dispense Refill   acetaminophen (TYLENOL) 500 MG tablet Take 1,000 mg by mouth every 6 (six) hours as needed for mild pain.     buPROPion (WELLBUTRIN XL) 300 MG 24 hr tablet Take 300 mg by mouth daily.     Calcium Carb-Cholecalciferol (CALCIUM + D3 PO) Take 1 tablet by mouth every morning.     Coenzyme Q10 (COQ-10) 200 MG CAPS Take 200 mg by mouth daily.     dexlansoprazole (DEXILANT) 60 MG capsule TAKE 1 CAPSULE(60 MG) BY MOUTH DAILY 90 capsule 3   dofetilide (TIKOSYN) 250 MCG capsule Take 1 capsule (250 mcg total) by mouth 2 (two) times daily. 60 capsule 5   ELIQUIS 5 MG TABS tablet TAKE 1 TABLET(5 MG) BY MOUTH TWICE DAILY 180 tablet 3   ferrous sulfate  325 (65 FE) MG EC tablet Take 325 mg by mouth daily.     fexofenadine (ALLEGRA) 180 MG tablet Take 180 mg by mouth daily.     furosemide (LASIX) 20 MG tablet Take 2 tablets as needed in case of weight gain 2 to 3 lbs in 24 hrs or 5 lbs in 7 days until weight back to baseline. (Patient taking differently: Take 40 mg by mouth daily.) 90 tablet 3   metoprolol succinate (TOPROL-XL) 25 MG 24 hr tablet Take 1 tablet (25 mg total) by mouth at bedtime. 30 tablet  5   nitroGLYCERIN (NITROSTAT) 0.4 MG SL tablet PLACE 1 TABLET UNDER THE TONGUE EVERY 5 MINUTES AS NEEDED FOR CHEST PAIN 25 tablet 3   rosuvastatin (CRESTOR) 20 MG tablet Take 1 tablet (20 mg total) by mouth daily. 90 tablet 3   sertraline (ZOLOFT) 25 MG tablet Take 1 tablet (25 mg total) by mouth daily. 30 tablet 0   spironolactone (ALDACTONE) 25 MG tablet TAKE 1 TABLET(25 MG) BY MOUTH DAILY 30 tablet 11   thyroid (ARMOUR) 120 MG tablet Take 120 mg by mouth daily before breakfast.     traZODone (DESYREL) 50 MG tablet Take 25 mg by mouth at bedtime.     triamcinolone (NASACORT) 55 MCG/ACT AERO nasal inhaler Place 2 sprays into the nose daily.      No current facility-administered medications for this encounter.    Physical Exam: There were no vitals taken for this visit.  GEN: patient uncomfortable appearing  NECK: No JVD; No carotid bruits CARDIAC: Irregularly irregular rate and rhythm, no murmurs, rubs, gallops RESPIRATORY:  Clear to auscultation without rales, wheezing or rhonchi  ABDOMEN: Soft, non-tender, non-distended EXTREMITIES:  No edema; No deformity    Wt Readings from Last 3 Encounters:  02/09/23 70.6 kg  02/03/23 71.9 kg  02/03/23 71.9 kg     EKG today demonstrates  Afib with RVR, LBBB Vent. rate 154 BPM PR interval * ms QRS duration 162 ms QT/QTcB 362/579 ms   Echo 11/20/22 demonstrated   1. The anterior, apical, inferoapical and anteroseptal walls are thinned  and akinetic. No LV thrombus by Definity contrast. Left ventricular  ejection fraction, by estimation, is 25 to 30%. The left ventricle has  severely decreased function. The left ventricle demonstrates regional wall motion abnormalities (see scoring diagram/findings for description). The left ventricular internal cavity size was mildly dilated. Left ventricular diastolic parameters are consistent with Grade I diastolic dysfunction (impaired relaxation).   2. Right ventricular systolic function is mildly  reduced. The right  ventricular size is normal. There is normal pulmonary artery systolic  pressure. The estimated right ventricular systolic pressure is 28.8 mmHg.   3. Left atrial size was moderately dilated.   4. The mitral valve is abnormal. Mild mitral valve regurgitation.   5. The aortic valve is tricuspid. Aortic valve regurgitation is not  visualized.   6. The inferior vena cava is normal in size with greater than 50%  respiratory variability, suggesting right atrial pressure of 3 mmHg.   Comparison(s): No significant change from prior study. 05/08/2021: LVEF  25-30%, akinesis inferoseptal/anteroseptal, apical lateral wall and apex.    CHA2DS2-VASc Score = 3  The patient's score is based upon: CHF History: 1 HTN History: 0 Diabetes History: 0 Stroke History: 0 Vascular Disease History: 1 Age Score: 0 Gender Score: 1       ASSESSMENT AND PLAN: Paroxysmal Atrial Fibrillation (ICD10:  I48.0) The patient's CHA2DS2-VASc score is 3, indicating a  3.2% annual risk of stroke.   Previously failed amiodarone (intolerable side effects) and sotalol (ineffective) S/p dofetilide loading 12/10-12/13/24 Patient appears to be in rapid afib, highly symptomatic. She has a LBBB at baseline. Given her very rapid rates and severe symptoms, recommend she proceed to the ED for evaluation and possibly emergent DCCV. Inpatient EP team made aware.  Continue Eliquis 5 mg BID Continue Toprol 25 mg daily  Secondary Hypercoagulable State (ICD10:  D68.69) The patient is at significant risk for stroke/thromboembolism based upon her CHA2DS2-VASc Score of 3.  Continue Apixaban (Eliquis).   CAD S/p STEMI 2017 No anginal symptoms  Chronic HFrEF Ischemic CM, s/p ICD EF 25% NYHA class II GDMT limited by hypotension and yeast infections Followed in [facility] Fluid status appears stable   Patient sent to ED for evaluation. ED staff notified.       Jorja Loa PA-C [facility] [address] [place], [place] [address] [phone]

## 2023-02-11 NOTE — ED Notes (Signed)
Port XR at bedside.

## 2023-02-11 NOTE — ED Notes (Signed)
Patient ambulated by this RN. Patient in NSR before and after ambulation. Patient denies lightheadedness, chest pain, or shortness of breath.

## 2023-02-11 NOTE — Consult Note (Addendum)
Cardiology Consultation   Patient ID: Catherine Mays MRN: [id number]; DOB: May 18, 1965  Admit date: 02/11/2023 Date of Consult: 02/11/2023  PCP:  Benita Stabile, MD   [facility] Providers Cardiologist:  Verne Carrow, MD  Electrophysiologist:  Lanier Prude, MD  {  Patient Profile:   Catherine Mays is a 57 y.o. female with a hx of PAF, CAD (s/p STEMI in 08/2015), ischemic cardiomyopathy, chronic HFrEF s/p St. Jude ICD in place (recent malfunctioning of RV ICD lead and replacement along with ICD generator replacement in 03/2022), history of GIB, mitral regurgitation, HLD, Stage 3 CKD and PVC's (prior PVC ablation in 03/2017 , GIB 11/2020, who is being seen 02/11/2023 for the evaluation of A-fib RVR.  Seen in office and sent to the ER for emergent cardioversion.  History of Present Illness:   In brief, Catherine Mays has history of ischemic cardiomyopathy with reduced EF status post [facility] Jude ICD.  Also followed by advanced heart failure as well as EP.  Has history of atrial fibrillation since 2017 around the timing of her MI.  Previously on amiodarone but did not tolerate due to side effects of nausea and significant fatigue. She had RHC 2017 showing preserved cardiac output but the PCWP was high due to prominent v-waves, presumably from significant mitral regurgitation.  Eventually had [facility] ICD placed.  Eventually saw EP due to frequent PVCs noted on device interrogation and was started on sotalol.  Then had PVC ablation in February 2019. She generally had been maintaining NSR until recent admission January 16, 2019 for which she was admitted for acute on chronic CHF exacerbation and A-fib RVR.  During this admission sotalol was also discontinued as it was considered ineffective, new plans for Tikosyn bridge to eventual ablation was planned.  Echocardiogram 25 to 30%.  Tikosyn bridge was started on 02/03/2023.  Toprol-XL was also added for her PVCs.  Of note she was in  sinus before and after her Tikosyn load.  She saw atrial fibrillation clinic today and noted be in A-fib RVR with heart rates in the 150s.  She reports that she has been in A-fib since approximately Monday night and has been complaining of chest pain, shortness of breath, tachycardia and a squeezing sensation in her neck.  She was sent to the emergency room for emergent cardioversion.  Cardiology asked to see because she had complained of chest pain.  Patient reports that she has chest pain only with these episodes of A-fib RVR.  Once RVR slows down as it is right now with heart rates around the 100 she has resolution of pain and feels comfortable however she gets up and starts walking she will had rapid rates and new onset of symptoms.  Otherwise previously had no issues with exertional chest pain, orthopnea, peripheral edema.  She reports 100% compliancy with her DOAC and does not have any other concerns today.  Chest x-ray shows cardiomegaly with no acute findings.  BNP elevated 1393.  Creatinine 1.27.  Negative troponins x 2  Past Medical History:  Diagnosis Date   AICD (automatic cardioverter/defibrillator) present    St. Jude   Anemia    CAD in native artery    a. late recognition of presentation of STEMI 08/2015 s/p DES to LAD, mild residual mRCA.   Chronic systolic CHF (congestive heart failure) (HCC)    CKD (chronic kidney disease), stage III (HCC)    Depression    Hypertension    Hypotension    a.  preventing med titration for HF.   Hypothyroidism 09/24/2015   Ischemic cardiomyopathy    Myocardial infarction [facility]) 08/2015   PAF (paroxysmal atrial fibrillation) (HCC) 09/24/2015   a. dx at time of STEMI 08/2015.   Pre-diabetes    Presence of permanent cardiac pacemaker    patient has ICD   PVC's (premature ventricular contractions)     Past Surgical History:  Procedure Laterality Date   BIOPSY  06/16/2019   Procedure: BIOPSY;  Surgeon: Corbin Ade, MD;  Location: [facility] ENDO  [facility];  Service: Endoscopy;;   CARDIAC CATHETERIZATION N/A 09/20/2015   Procedure: Left Heart Cath and Coronary Angiography;  Surgeon: Kathleene Hazel, MD;  Location: [facility] INVASIVE CV LAB;  Service: Cardiovascular;  Laterality: N/A;   CARDIAC CATHETERIZATION N/A 09/20/2015   Procedure: Coronary Stent Intervention;  Surgeon: Kathleene Hazel, MD;  Location: [facility] INVASIVE CV LAB;  Service: Cardiovascular;  Laterality: N/A;   CARDIAC CATHETERIZATION N/A 09/21/2015   Procedure: Left Heart Cath and Coronary Angiography;  Surgeon: Tonny Bollman, MD;  Location: [facility] INVASIVE CV LAB;  Service: Cardiovascular;  Laterality: N/A;   CARDIAC CATHETERIZATION N/A 11/26/2015   Procedure: Right Heart Cath;  Surgeon: [name], MD;  Location: [facility] INVASIVE CV LAB;  Service: Cardiovascular;  Laterality: N/A;   COLONOSCOPY WITH PROPOFOL N/A 11/23/2017   Dr. Jena Gauss: Diverticulosis, internal hemorrhoids, next colonoscopy in 10 years   COLONOSCOPY WITH PROPOFOL N/A 08/10/2019   Procedure: COLONOSCOPY WITH PROPOFOL;  Surgeon: Corbin Ade, MD;  Diverticulosis in sigmoid colon, nonbleeding internal hemorrhoids, otherwise normal exam.     COLONOSCOPY WITH PROPOFOL N/A 12/02/2020   Procedure: COLONOSCOPY WITH PROPOFOL;  Surgeon: Jeani Hawking, MD;  Location: [facility] ENDOSCOPY;  Service: Endoscopy;  Laterality: N/A;   CORONARY STENT PLACEMENT  09/20/2015   EP IMPLANTABLE DEVICE N/A 01/23/2016   Procedure: ICD Implant;  Surgeon: Will Jorja Loa, MD;  Location: [facility] INVASIVE CV LAB;  Service: Cardiovascular;  Laterality: N/A;   ESOPHAGOGASTRODUODENOSCOPY (EGD) WITH PROPOFOL N/A 06/16/2019   Procedure: ESOPHAGOGASTRODUODENOSCOPY (EGD) WITH PROPOFOL;  Surgeon: Corbin Ade, MD;  Normal esophagus, small hiatal hernia, normal examined stomach, normal examined duodenum.  Gastric biopsy with slight chronic inflammation, duodenal biopsy with slight intramucosal Brunner gland hyperplasia.   ESOPHAGOGASTRODUODENOSCOPY (EGD) WITH  PROPOFOL N/A 11/30/2020   Procedure: ESOPHAGOGASTRODUODENOSCOPY (EGD) WITH PROPOFOL;  Surgeon: Jenel Lucks, MD;  Location: [facility] ENDOSCOPY;  Service: Gastroenterology;  Laterality: N/A;   GIVENS CAPSULE STUDY N/A 11/23/2019    Surgeon: Corbin Ade, MD; essentially unremarkable except for tiny erosion in the proximal small bowel.   GIVENS CAPSULE STUDY  11/30/2020   Procedure: GIVENS CAPSULE STUDY;  Surgeon: Jenel Lucks, MD;  Location: [facility] ENDOSCOPY;  Service: Gastroenterology;;   ICD GENERATOR CHANGEOUT N/A 04/14/2022   Procedure: ICD GENERATOR CHANGEOUT;  Surgeon: Lanier Prude, MD;  Location: [facility] INVASIVE CV LAB;  Service: Cardiovascular;  Laterality: N/A;   LEAD EXTRACTION N/A 04/14/2022   Procedure: LEAD EXTRACTION;  Surgeon: Lanier Prude, MD;  Location: [facility] INVASIVE CV LAB;  Service: Cardiovascular;  Laterality: N/A;   LEFT HEART CATH AND CORONARY ANGIOGRAPHY N/A 12/18/2017   Procedure: LEFT HEART CATH AND CORONARY ANGIOGRAPHY;  Surgeon: [name], MD;  Location: [facility] INVASIVE CV LAB;  Service: Cardiovascular;  Laterality: N/A;   PVC ABLATION N/A 04/02/2017   Procedure: PVC ABLATION;  Surgeon: Regan Lemming, MD;  Location: [facility] INVASIVE CV LAB;  Service: Cardiovascular;  Laterality: N/A;   TEE WITHOUT CARDIOVERSION N/A  10/08/2016   Procedure: TRANSESOPHAGEAL ECHOCARDIOGRAM (TEE);  Surgeon: [name], MD;  Location: [facility] ENDOSCOPY;  Service: Cardiovascular;  Laterality: N/A;   THYROIDECTOMY      Inpatient Medications: Scheduled Meds:  Continuous Infusions:  PRN Meds:   Allergies:    Allergies  Allergen Reactions   Paxil [Paroxetine] Palpitations   Amiodarone Nausea And Vomiting   Chlorhexidine Gluconate Itching   Morphine And Codeine Nausea And Vomiting   Percocet [Oxycodone-Acetaminophen] Nausea And Vomiting   Cefdinir Nausea Only   Zolpidem Other (See Comments)    Doesn't work for patient at all    Social History:   Social History    Socioeconomic History   Marital status: Married    Spouse name: Not on file   Number of children: Not on file   Years of education: Not on file   Highest education level: Not on file  Occupational History   Not on file  Tobacco Use   Smoking status: Never   Smokeless tobacco: Never   Tobacco comments:    Never smoked 01/21/23  Vaping Use   Vaping status: Never Used  Substance and Sexual Activity   Alcohol use: No   Drug use: No   Sexual activity: Not on file  Other Topics Concern   Not on file  Social History Narrative   Not on file   Social Drivers of Health   Financial Resource Strain: Low Risk  (02/10/2020)   Overall Financial Resource Strain (CARDIA)    Difficulty of Paying Living Expenses: Not hard at all  Food Insecurity: No Food Insecurity (02/03/2023)   Hunger Vital Sign    Worried About Running Out of Food in the Last Year: Never true    Ran Out of Food in the Last Year: Never true  Transportation Needs: No Transportation Needs (02/03/2023)   PRAPARE - Administrator, Civil Service (Medical): No    Lack of Transportation (Non-Medical): No  Physical Activity: Insufficiently Active (02/10/2020)   Exercise Vital Sign    Days of Exercise per Week: 3 days    Minutes of Exercise per Session: 30 min  Stress: No Stress Concern Present (02/10/2020)   [employer] of Occupational Health - Occupational Stress Questionnaire    Feeling of Stress : Not at all  Social Connections: Socially Integrated (02/10/2020)   Social Connection and Isolation Panel [NHANES]    Frequency of Communication with Friends and Family: More than three times a week    Frequency of Social Gatherings with Friends and Family: More than three times a week    Attends Religious Services: More than 4 times per year    Active Member of [employer] or Organizations: Yes    Attends Engineer, structural: More than 4 times per year    Marital Status: Married  Catering manager  Violence: Not At Risk (02/03/2023)   Humiliation, Afraid, Rape, and Kick questionnaire    Fear of Current or Ex-Partner: No    Emotionally Abused: No    Physically Abused: No    Sexually Abused: No    Family History:   Family History  Problem Relation Age of Onset   Arrhythmia Mother    Lung cancer Mother        bronchiectasis   Heart failure Father    Heart attack Father    Heart attack Brother    Arrhythmia Brother    Colon cancer Neg Hx    Colon polyps Neg Hx  ROS:  Please see the history of present illness.  All other ROS reviewed and negative.     Physical Exam/Data:   Vitals:   02/11/23 1415 02/11/23 1418 02/11/23 1420 02/11/23 1600  BP: 102/80   100/73  Pulse: (!) 103   89  Resp: 14   14  Temp: 97.8 F (36.6 C)  98.7 F (37.1 C)   TempSrc: Oral  Oral   SpO2: 100%   100%  Weight:  70.3 kg    Height:  5\' 9"  (1.753 m)     No intake or output data in the 24 hours ending 02/11/23 1635    02/11/2023    2:18 PM 02/11/2023    1:37 PM 02/09/2023   10:11 AM  Last 3 Weights  Weight (lbs) 155 lb 154 lb 155 lb 9.6 oz  Weight (kg) 70.308 kg 69.854 kg 70.58 kg     Body mass index is 22.89 kg/m.  General:  Well nourished, well developed, in no acute distress HEENT: normal Neck: no JVD Vascular: No carotid bruits; Distal pulses 2+ bilaterally Cardiac: Irregularly irregular, tachycardic Lungs:  clear to auscultation bilaterally, no wheezing, rhonchi or rales  Abd: soft, nontender, no hepatomegaly  Ext: no edema Musculoskeletal:  No deformities, BUE and BLE strength normal and equal Skin: warm and dry  Neuro:  CNs 2-12 intact, no focal abnormalities noted Psych:  Normal affect   EKG:  The EKG was personally reviewed and demonstrates: Atrial fibrillation, heart rate 102, IVCD. Telemetry:  Telemetry was personally reviewed and demonstrates: Atrial fibrillation heart rates between 100 150.  Currently closer to 100  Relevant CV Studies: Echocardiogram  11/20/2022 1. The anterior, apical, inferoapical and anteroseptal walls are thinned  and akinetic. No LV thrombus by Definity contrast. Left ventricular  ejection fraction, by estimation, is 25 to 30%. The left ventricle has  severely decreased function. The left  ventricle demonstrates regional wall motion abnormalities (see scoring  diagram/findings for description). The left ventricular internal cavity  size was mildly dilated. Left ventricular diastolic parameters are  consistent with Grade I diastolic  dysfunction (impaired relaxation).   2. Right ventricular systolic function is mildly reduced. The right  ventricular size is normal. There is normal pulmonary artery systolic  pressure. The estimated right ventricular systolic pressure is 28.8 mmHg.   3. Left atrial size was moderately dilated.   4. The mitral valve is abnormal. Mild mitral valve regurgitation.   5. The aortic valve is tricuspid. Aortic valve regurgitation is not  visualized.   6. The inferior vena cava is normal in size with greater than 50%  respiratory variability, suggesting right atrial pressure of 3 mmHg.   Comparison(s): No significant change from prior study. 05/08/2021: LVEF  25-30%, akinesis inferoseptal/anteroseptal, apical lateral wall and apex.    Laboratory Data:  High Sensitivity Troponin:   Recent Labs  Lab 01/16/23 1635 01/16/23 2030 02/11/23 1418  TROPONINIHS 7 7 7      Chemistry Recent Labs  Lab 02/06/23 0436 02/09/23 1041 02/11/23 1418  NA 138 137 138  K 4.5 4.1 4.2  CL 105 104 103  CO2 27 26 23   GLUCOSE 95 118* 160*  BUN 19 19 18   CREATININE 1.01* 1.17* 1.27*  CALCIUM 8.8* 8.9 9.3  MG 2.1 2.0 2.0  GFRNONAA >60 54* 49*  ANIONGAP 6 7 12     No results for input(s): "PROT", "ALBUMIN", "AST", "ALT", "ALKPHOS", "BILITOT" in the last 168 hours. Lipids No results for input(s): "CHOL", "TRIG", "HDL", "LABVLDL", "  LDLCALC", "CHOLHDL" in the last 168 hours.  Hematology Recent Labs   Lab 02/09/23 1041 02/11/23 1418  WBC 5.3 8.0  RBC 4.18 4.28  HGB 12.1 12.8  HCT 38.6 40.1  MCV 92.3 93.7  MCH 28.9 29.9  MCHC 31.3 31.9  RDW 13.4 13.5  PLT 177 223   Thyroid No results for input(s): "TSH", "FREET4" in the last 168 hours.  BNP Recent Labs  Lab 02/09/23 1041 02/11/23 1418  BNP 483.2* 1,393.2*    DDimer No results for input(s): "DDIMER" in the last 168 hours.   Radiology/Studies:  DG Chest Portable 1 View Result Date: 02/11/2023 CLINICAL DATA:  Chest pain and generalized weakness. EXAM: PORTABLE CHEST 1 VIEW COMPARISON:  Chest radiograph dated January 16, 2023. FINDINGS: Stable cardiomegaly. Stable left-sided ICD. No focal consolidation, sizeable pleural effusion, or pneumothorax. No acute osseous abnormality. IMPRESSION: Cardiomegaly.  No acute cardiopulmonary findings. Electronically Signed   By: Hart Robinsons M.D.   On: 02/11/2023 16:22     Assessment and Plan:   Paroxysmal atrial fibrillation with RVR Status post recent Tikosyn load  02/03/2023 with plans for eventual ablation.  Today went into A-fib RVR with heart rates reaching as high as 150, now 100 however she is very symptomatic with these rapid rates complaining of chest pain, shortness of breath, squeezing sensation in her neck.  She has soft blood pressure however likely stable now for emergent cardioversion.  She reports had a recent compliancy with her DOAC.  Will discuss with MD. Currently on Tikosyn 250 mg twice daily.  Not sure if this represents true Tikosyn failure necessitating up titration of dose.  Defer to EP. Continue Eliquis 5 mg twice daily, Toprol-XL 25 mg. Did not tolerate amiodarone, sotalol ineffective.  Chronic HFrEF 25-30% status post ICD Ischemic cardiomyopathy  Euvolemic on exam.  Can continue home medications On spironolactone 25 mg, Toprol-XL 25 mg, Lasix 20 mg.  Not on SGLT2 inhibitor due to yeast infections.  BP likely cannot tolerate Entresto. Had ACE inhibitor  cough.  CAD status post DES to proximal LAD in 2017 Left heart cath in 2019 showed patent stent with no obstructive disease.  Currently on rosuvastatin.  Negative troponins  MR Continue to monitor    Risk Assessment/Risk Scores:        New York Heart Association (NYHA) Functional Class NYHA Class II  CHA2DS2-VASc Score = 3   This indicates a 3.2% annual risk of stroke. The patient's score is based upon: CHF History: 1 HTN History: 0 Diabetes History: 0 Stroke History: 0 Vascular Disease History: 1 Age Score: 0 Gender Score: 1     For questions or updates, please contact [facility] Please consult [email or web address] for contact info under    Signed, Abagail Kitchens, PA-C  02/11/2023 4:35 PM    Attending Note:   The patient was seen and examined.  Agree with assessment and plan as noted above.  Changes made to the above note as needed.  Patient seen and independently examined with Yvonna Alanis, PA .   We discussed all aspects of the encounter. I agree with the assessment and plan as stated above.    Recurrent atrial fib .   She was doing well until 1-2 days ago when she went back into atrial fib .   She started having cp and dyspnea at that point .  I think our best option is to proceed with cardioversion in the ER  We discussed risks,benefits, optioins, She understands and agrees  to proceed.     I have spent a total of 40 minutes with patient reviewing hospital  notes , telemetry, EKGs, labs and examining patient as well as establishing an assessment and plan that was discussed with the patient.  > 50% of time was spent in direct patient care.    Vesta Mixer, Montez Hageman., MD, [facility] 02/11/2023, 6:39 PM [address]  [address] Office [phone] Pager [phone]

## 2023-02-11 NOTE — ED Provider Notes (Signed)
3:36 PM Care assumed from Dr. Billy Coast.  At time of transfer care, patient awaiting evaluation by cardiology/EP to determine disposition.  Will also await results of workup with labs and cardiac enzymes.  Anticipate follow-up on their recommendations.  5:20 PM Cardiology recommended procedural sedation and they will assist in cardioversion.  Will perform this and anticipate discharge home after.  .Sedation  Date/Time: 02/11/2023 5:21 PM  Performed by: Heide Scales, MD Authorized by: Heide Scales, MD   Consent:    Consent obtained:  Written   Consent given by:  Patient Universal protocol:    Immediately prior to procedure, a time out was called: yes   Pre-sedation assessment:    Time since last food or drink:  Hours   ASA classification: class 2 - patient with mild systemic disease     Mallampati score:  I - soft palate, uvula, fauces, pillars visible   Pre-sedation assessments completed and reviewed: pre-procedure cardiovascular function not reviewed, pre-procedure mental status not reviewed and pre-procedure respiratory function not reviewed   A pre-sedation assessment was completed prior to the start of the procedure Immediate pre-procedure details:    Reassessment: Patient reassessed immediately prior to procedure     Reviewed: vital signs, relevant labs/tests and NPO status     Verified: bag valve mask available, emergency equipment available, intubation equipment available, IV patency confirmed and oxygen available   Procedure details (see MAR for exact dosages):    Preoxygenation:  Nasal cannula   Sedation:  Etomidate   Intended level of sedation: deep   Total Provider sedation time (minutes):  20 Post-procedure details:   A post-sedation assessment was completed following the completion of the procedure.   Attendance: Constant attendance by certified staff until patient recovered     Recovery: Patient returned to pre-procedure baseline     Post-sedation  assessments completed and reviewed: post-procedure airway patency not reviewed, post-procedure cardiovascular function not reviewed, post-procedure mental status not reviewed and post-procedure nausea and vomiting status not reviewed     Patient is stable for discharge or admission: yes     Procedure completion:  Tolerated well, no immediate complications   7:04 PM Patient cardioverted without difficulty and is now back to baseline.  She passed a p.o. challenge and is feeling well.  She will be discharged home per cardiology recommendations.  Patient discharged in good condition with understanding return precautions and follow-up instructions.  Clinical Impression: 1. Chest pain, unspecified type   2. Atrial fibrillation, unspecified type (HCC)     Disposition: Discharge  Condition: Good  I have discussed the results, Dx and Tx plan with the pt(& family if present). He/she/they expressed understanding and agree(s) with the plan. Discharge instructions discussed at great length. Strict return precautions discussed and pt &/or family have verbalized understanding of the instructions. No further questions at time of discharge.    New Prescriptions   No medications on file    Follow Up: your cardiology team and PCP         Mikael Debell, Canary Brim, MD 02/11/23 1904

## 2023-02-11 NOTE — ED Notes (Signed)
Consent for sedation and cardioversion signed by patient and witnessed by this RN

## 2023-02-11 NOTE — ED Provider Notes (Signed)
[place] EMERGENCY DEPARTMENT AT [facility] Provider Note   CSN: [id number] Arrival date & time: 02/11/23  1401     History  No chief complaint on file.   Catherine Mays is a 57 y.o. female with a history of paroxysmal A-fib, coronary disease, on Eliquis, on Tikosyn, presented to ED with complaint of palpitations.  Patient was referred here from the A-fib clinic with concern for symptomatic A-fib.  She reports that she feels she went into A-fib perhaps a few days ago, has felt extremely weak, like her legs are rubber, also chest pain to her back with nausea and lightheadedness.  She has been taking her medications at home.  Per my review of her A-fib clinic note today, she has a history of a [place] Jude's ICD in place, recent malfunctioning of the right ventricular ICD lead and replacement in 2024, history of mitral regurg, history of GI bleed, history of stage III kidney disease, history of a STEMI in 2017  HPI     Home Medications Prior to Admission medications   Medication Sig Start Date End Date Taking? Authorizing Provider  acetaminophen (TYLENOL) 500 MG tablet Take 1,000 mg by mouth every 6 (six) hours as needed for mild pain.    [provider]  buPROPion (WELLBUTRIN XL) 300 MG 24 hr tablet Take 300 mg by mouth daily. 11/07/20   [provider]  Calcium Carb-Cholecalciferol (CALCIUM + D3 PO) Take 1 tablet by mouth every morning.    [provider]  Coenzyme Q10 (COQ-10) 200 MG CAPS Take 200 mg by mouth daily.    [provider]  dexlansoprazole (DEXILANT) 60 MG capsule TAKE 1 CAPSULE(60 MG) BY MOUTH DAILY 05/09/22   [name], MD  dofetilide (TIKOSYN) 250 MCG capsule Take 1 capsule (250 mcg total) by mouth 2 (two) times daily. 02/06/23   Sheilah Pigeon, PA-C  ELIQUIS 5 MG TABS tablet TAKE 1 TABLET(5 MG) BY MOUTH TWICE DAILY 01/26/23   [name], MD  ferrous sulfate 325 (65 FE) MG EC tablet Take 325 mg by mouth daily.     [provider]  fexofenadine (ALLEGRA) 180 MG tablet Take 180 mg by mouth daily.    [provider]  furosemide (LASIX) 20 MG tablet Take 2 tablets as needed in case of weight gain 2 to 3 lbs in 24 hrs or 5 lbs in 7 days until weight back to baseline. Patient taking differently: Take 40 mg by mouth daily. 01/19/23   Arrien, [place] Ram, MD  metoprolol succinate (TOPROL-XL) 25 MG 24 hr tablet Take 1 tablet (25 mg total) by mouth at bedtime. 02/06/23   Sheilah Pigeon, PA-C  nitroGLYCERIN (NITROSTAT) 0.4 MG SL tablet PLACE 1 TABLET UNDER THE TONGUE EVERY 5 MINUTES AS NEEDED FOR CHEST PAIN 04/13/20   [name], MD  rosuvastatin (CRESTOR) 20 MG tablet Take 1 tablet (20 mg total) by mouth daily. 08/27/22   [name], MD  sertraline (ZOLOFT) 25 MG tablet Take 1 tablet (25 mg total) by mouth daily. 01/20/23 02/19/23  Arrien, [place] Ram, MD  spironolactone (ALDACTONE) 25 MG tablet TAKE 1 TABLET(25 MG) BY MOUTH DAILY 05/19/22   [name], MD  thyroid (ARMOUR) 120 MG tablet Take 120 mg by mouth daily before breakfast.    [provider]  traZODone (DESYREL) 50 MG tablet Take 25 mg by mouth at bedtime.    [provider]  triamcinolone (NASACORT) 55 MCG/ACT AERO nasal inhaler  Place 2 sprays into the nose daily.     [provider]      Allergies    Paxil [paroxetine], Amiodarone, Chlorhexidine gluconate, Morphine and codeine, Percocet [oxycodone-acetaminophen], Cefdinir, and Zolpidem    Review of Systems   Review of Systems  Physical Exam Updated Vital Signs BP 102/80   Pulse (!) 103   Temp 98.7 F (37.1 C) (Oral)   Resp 14   Ht 5\' 9"  (1.753 m)   Wt 70.3 kg   SpO2 100%   BMI 22.89 kg/m  Physical Exam Constitutional:      General: She is not in acute distress. HENT:     Head: Normocephalic and atraumatic.  Eyes:     Conjunctiva/sclera: Conjunctivae normal.     Pupils: Pupils are equal, round, and reactive to light.   Cardiovascular:     Rate and Rhythm: Tachycardia present. Rhythm irregular.  Pulmonary:     Effort: Pulmonary effort is normal. No respiratory distress.  Abdominal:     General: There is no distension.     Tenderness: There is no abdominal tenderness.  Skin:    General: Skin is warm and dry.  Neurological:     General: No focal deficit present.     Mental Status: She is alert and oriented to person, place, and time. Mental status is at baseline.  Psychiatric:        Mood and Affect: Mood normal.        Behavior: Behavior normal.     ED Results / Procedures / Treatments   Labs (all labs ordered are listed, but only abnormal results are displayed) Labs Reviewed - No data to display  EKG None  Radiology No results found.  Procedures Procedures    Medications Ordered in ED Medications  ondansetron (ZOFRAN) injection 4 mg (has no administration in time range)  sodium chloride 0.9 % bolus 500 mL (has no administration in time range)    ED Course/ Medical Decision Making/ A&P Clinical Course as of 02/11/23 1701  Wed Feb 11, 2023  1430 Paged cardsmaster [MT]  1437 Spoke to [employer] - EP team aware of patient in Ed and pending consult, labs, trop [MT]    Clinical Course User Index [MT] Katlynn Naser, Kermit Balo, MD                                 Medical Decision Making Amount and/or Complexity of Data Reviewed Labs: ordered. Radiology: ordered.  Risk Prescription drug management.   This patient presents to the ED with concern for A-fib, chest pain, lightheadedness. This involves an extensive number of treatment options, and is a complaint that carries with it a high risk of complications and morbidity.  The differential diagnosis includesA Fib with RVR versus atypical ACS versus infection versus other  Co-morbidities that complicate the patient evaluation: History of cardiovascular disease at high risk of cardiovascular disease and cardiomyopathy  Additional  history obtained from husband  External records from outside source obtained and reviewed including A fib clinic note, cardiology notes  I ordered and personally interpreted labs.  The pertinent results include:  pending at signout  I ordered imaging studies including dg chest, pending  The patient was maintained on a cardiac monitor.  I personally viewed and interpreted the cardiac monitored which showed an underlying rhythm of: A-fib, heart rate 100-110 bpm   Per my interpretation the patient's ECG shows A-fib with RVR,  no acute ischemic findings   I have reviewed the patients home medicines and have made adjustments as needed  Test Considered: Lower suspicion for acute PE in this clinical setting.  Patient is compliant with Eliquis.  I requested consultation with the cardiology,  and discussed lab and imaging findings as well as pertinent plan - they recommend: consult pending  Dispostion:  The patient is signed out to Dr Ricke Hey EDP pending cardiology consultation and results on labs, troponin, dg chest.  She is in A Fib with HR approx 100 bpm at rest -- holding off on further rate control medications until cardiology input.         Final Clinical Impression(s) / ED Diagnoses Final diagnoses:  None    Rx / DC Orders ED Discharge Orders     None         Breya Cass, Kermit Balo, MD 02/11/23 [phone]

## 2023-02-11 NOTE — Discharge Instructions (Signed)
Your history, exam and workup today revealed you were indeed in atrial fibrillation and cardiology was able to perform a cardioversion during our sedation.  As you are back to baseline and vital signs are reassuring and as you have proven stability now, we feel you are safe for discharge home.  Please rest and stay hydrated and follow-up with your cardiology team.  If any symptoms recur, change, worsen, please return to the nearest emergency department.

## 2023-02-12 ENCOUNTER — Other Ambulatory Visit: Payer: Self-pay

## 2023-02-12 DIAGNOSIS — I480 Paroxysmal atrial fibrillation: Secondary | ICD-10-CM

## 2023-02-12 NOTE — Progress Notes (Signed)
Remote ICD transmission.   

## 2023-02-13 ENCOUNTER — Ambulatory Visit (HOSPITAL_COMMUNITY): Payer: BC Managed Care – PPO | Admitting: Physician Assistant

## 2023-02-20 ENCOUNTER — Other Ambulatory Visit (HOSPITAL_COMMUNITY): Payer: Self-pay | Admitting: Cardiology

## 2023-02-20 ENCOUNTER — Other Ambulatory Visit (HOSPITAL_COMMUNITY): Payer: Self-pay

## 2023-02-24 ENCOUNTER — Encounter: Payer: Self-pay | Admitting: Cardiology

## 2023-03-03 ENCOUNTER — Ambulatory Visit (HOSPITAL_COMMUNITY)
Admission: RE | Admit: 2023-03-03 | Discharge: 2023-03-03 | Disposition: A | Discharge status: Home / Self Care | Payer: BC Managed Care – PPO | Source: Ambulatory Visit | Attending: Physician Assistant | Admitting: Physician Assistant

## 2023-03-03 ENCOUNTER — Encounter (HOSPITAL_COMMUNITY): Payer: Self-pay | Admitting: Physician Assistant

## 2023-03-03 ENCOUNTER — Other Ambulatory Visit (HOSPITAL_COMMUNITY): Payer: Self-pay

## 2023-03-03 VITALS — BP 106/66 | HR 73 | Ht 69.0 in | Wt 154.0 lb

## 2023-03-03 DIAGNOSIS — I480 Paroxysmal atrial fibrillation: Secondary | ICD-10-CM | POA: Diagnosis not present

## 2023-03-03 DIAGNOSIS — R9431 Abnormal electrocardiogram [ECG] [EKG]: Secondary | ICD-10-CM | POA: Insufficient documentation

## 2023-03-03 DIAGNOSIS — N183 Chronic kidney disease, stage 3 unspecified: Secondary | ICD-10-CM | POA: Diagnosis not present

## 2023-03-03 DIAGNOSIS — E785 Hyperlipidemia, unspecified: Secondary | ICD-10-CM | POA: Insufficient documentation

## 2023-03-03 DIAGNOSIS — I340 Nonrheumatic mitral (valve) insufficiency: Secondary | ICD-10-CM | POA: Insufficient documentation

## 2023-03-03 DIAGNOSIS — I130 Hypertensive heart and chronic kidney disease with heart failure and stage 1 through stage 4 chronic kidney disease, or unspecified chronic kidney disease: Secondary | ICD-10-CM | POA: Insufficient documentation

## 2023-03-03 DIAGNOSIS — I4819 Other persistent atrial fibrillation: Secondary | ICD-10-CM

## 2023-03-03 DIAGNOSIS — I255 Ischemic cardiomyopathy: Secondary | ICD-10-CM | POA: Insufficient documentation

## 2023-03-03 DIAGNOSIS — D6869 Other thrombophilia: Secondary | ICD-10-CM | POA: Insufficient documentation

## 2023-03-03 DIAGNOSIS — Z79899 Other long term (current) drug therapy: Secondary | ICD-10-CM | POA: Diagnosis not present

## 2023-03-03 DIAGNOSIS — I252 Old myocardial infarction: Secondary | ICD-10-CM | POA: Diagnosis present

## 2023-03-03 DIAGNOSIS — Z95810 Presence of automatic (implantable) cardiac defibrillator: Secondary | ICD-10-CM | POA: Insufficient documentation

## 2023-03-03 DIAGNOSIS — I2510 Atherosclerotic heart disease of native coronary artery without angina pectoris: Secondary | ICD-10-CM | POA: Diagnosis present

## 2023-03-03 DIAGNOSIS — Z7901 Long term (current) use of anticoagulants: Secondary | ICD-10-CM | POA: Insufficient documentation

## 2023-03-03 DIAGNOSIS — I493 Ventricular premature depolarization: Secondary | ICD-10-CM | POA: Insufficient documentation

## 2023-03-03 DIAGNOSIS — I5022 Chronic systolic (congestive) heart failure: Secondary | ICD-10-CM | POA: Diagnosis not present

## 2023-03-03 DIAGNOSIS — Z5181 Encounter for therapeutic drug level monitoring: Secondary | ICD-10-CM | POA: Diagnosis not present

## 2023-03-03 NOTE — Progress Notes (Signed)
 Primary Care Physician: Shona Norleen PEDLAR, MD Primary Cardiologist: Lonni Cash, MD Electrophysiologist: OLE ONEIDA HOLTS, MD  Referring Physician: Dr Holts [facility]: Dr Rolan Sober Catherine Mays is a 58 y.o. female with a history of CAD (s/p STEMI in 08/2015), chronic HFrEF s/p St. Jude ICD in place (recent malfunctioning of RV ICD lead and replacement along with ICD generator replacement in 03/2022), history of GIB, mitral regurgitation, HLD, Stage 3 CKD and PVC's (prior PVC ablation in 03/2017), atrial fibrillation who presents for follow up in the [facility] Atrial Fibrillation Clinic.    Patient was admitted 01/16/23 with acute on chronic CHF and afib with RVR. Her sotalol  was discontinued with plan for dofetilide  as a bridge to ablation. Echo at that time showed EF 25-30%. Patient is on Eliquis  for a CHADS2VASC score of 3.  She was seen in clinic 02/11/23 with symptoms of tachypalpitations, chest pain, SOB, and a squeezing sensation in her neck. Heart rates were ~150 bpm. She was sent to the ED for urgent DCCV.  On follow up today, patient reports that she felt immediately better when she woke after her DCCV. She has felt well since that time and remains in SR. Her SOB and chest tightness has resolved. No symptoms of fluid overload.   Today, she denies symptoms of palpitations, chest pain, orthopnea, PND, lower extremity edema, dizziness, presyncope, syncope, snoring, daytime somnolence, bleeding, or neurologic sequela. The patient is tolerating medications without difficulties and is otherwise without complaint today.    Atrial Fibrillation Risk Factors:  she does not have symptoms or diagnosis of sleep apnea. she does not have a history of rheumatic fever. The patient does have a history of early familial atrial fibrillation or other arrhythmias.  Atrial Fibrillation Management history:  Previous antiarrhythmic drugs: amiodarone , sotalol , dofetilide   Previous  cardioversions: 02/11/23 Previous ablations: none Anticoagulation history: Eliquis   ROS- All systems are reviewed and negative except as per the HPI above.  Past Medical History:  Diagnosis Date   AICD (automatic cardioverter/defibrillator) present    St. Jude   Anemia    CAD in native artery    a. late recognition of presentation of STEMI 08/2015 s/p DES to LAD, mild residual mRCA.   Chronic systolic CHF (congestive heart failure) (HCC)    CKD (chronic kidney disease), stage III (HCC)    Depression    Hypertension    Hypotension    a. preventing med titration for HF.   Hypothyroidism 09/24/2015   Ischemic cardiomyopathy    Myocardial infarction [facility]) 08/2015   PAF (paroxysmal atrial fibrillation) (HCC) 09/24/2015   a. dx at time of STEMI 08/2015.   Pre-diabetes    Presence of permanent cardiac pacemaker    patient has ICD   PVC's (premature ventricular contractions)     Current Outpatient Medications  Medication Sig Dispense Refill   acetaminophen  (TYLENOL ) 500 MG tablet Take 1,000 mg by mouth every 6 (six) hours as needed for mild pain.     buPROPion  (WELLBUTRIN  XL) 300 MG 24 hr tablet Take 300 mg by mouth daily.     Calcium  Carb-Cholecalciferol (CALCIUM  + D3 PO) Take 1 tablet by mouth every morning.     Coenzyme Q10 (COQ-10) 200 MG CAPS Take 200 mg by mouth daily.     dexlansoprazole  (DEXILANT ) 60 MG capsule TAKE 1 CAPSULE(60 MG) BY MOUTH DAILY 90 capsule 3   dofetilide  (TIKOSYN ) 250 MCG capsule Take 1 capsule (250 mcg total) by mouth 2 (two) times daily. 60  capsule 5   ELIQUIS  5 MG TABS tablet TAKE 1 TABLET(5 MG) BY MOUTH TWICE DAILY 180 tablet 3   ferrous sulfate  325 (65 FE) MG EC tablet Take 325 mg by mouth daily.     fexofenadine (ALLEGRA) 180 MG tablet Take 180 mg by mouth daily.     furosemide  (LASIX ) 20 MG tablet Take 2 tablets as needed in case of weight gain 2 to 3 lbs in 24 hrs or 5 lbs in 7 days until weight back to baseline. (Patient taking differently: Take 40  mg by mouth daily.) 90 tablet 3   metoprolol  succinate (TOPROL -XL) 25 MG 24 hr tablet Take 1 tablet (25 mg total) by mouth at bedtime. 30 tablet 5   nitroGLYCERIN  (NITROSTAT ) 0.4 MG SL tablet PLACE 1 TABLET UNDER THE TONGUE EVERY 5 MINUTES AS NEEDED FOR CHEST PAIN 25 tablet 3   rosuvastatin  (CRESTOR ) 20 MG tablet Take 1 tablet (20 mg total) by mouth daily. 90 tablet 3   spironolactone  (ALDACTONE ) 25 MG tablet TAKE 1 TABLET(25 MG) BY MOUTH DAILY 30 tablet 11   thyroid  (ARMOUR) 120 MG tablet Take 120 mg by mouth daily before breakfast.     traZODone  (DESYREL ) 50 MG tablet Take 25 mg by mouth at bedtime.     triamcinolone  (NASACORT ) 55 MCG/ACT AERO nasal inhaler Place 2 sprays into the nose daily.      sertraline  (ZOLOFT ) 25 MG tablet Take 1 tablet (25 mg total) by mouth daily. 30 tablet 0   No current facility-administered medications for this encounter.    Physical Exam: BP 106/66   Pulse 73   Ht 5' 9 (1.753 m)   Wt 69.9 kg   BMI 22.74 kg/m   GEN: Well nourished, well developed in no acute distress NECK: No JVD; No carotid bruits CARDIAC: Regular rate and rhythm, no murmurs, rubs, gallops RESPIRATORY:  Clear to auscultation without rales, wheezing or rhonchi  ABDOMEN: Soft, non-tender, non-distended EXTREMITIES:  No edema; No deformity    Wt Readings from Last 3 Encounters:  03/03/23 69.9 kg  02/11/23 70.3 kg  02/11/23 69.9 kg     EKG today demonstrates  SR, 1st degree AV block, LBBB Vent. rate 73 BPM PR interval 202 ms QRS duration 166 ms QT/QTcB 430/473 ms   Echo 11/20/22 demonstrated   1. The anterior, apical, inferoapical and anteroseptal walls are thinned  and akinetic. No LV thrombus by Definity  contrast. Left ventricular  ejection fraction, by estimation, is 25 to 30%. The left ventricle has  severely decreased function. The left ventricle demonstrates regional wall motion abnormalities (see scoring diagram/findings for description). The left ventricular internal  cavity size was mildly dilated. Left ventricular diastolic parameters are consistent with Grade I diastolic dysfunction (impaired relaxation).   2. Right ventricular systolic function is mildly reduced. The right  ventricular size is normal. There is normal pulmonary artery systolic  pressure. The estimated right ventricular systolic pressure is 28.8 mmHg.   3. Left atrial size was moderately dilated.   4. The mitral valve is abnormal. Mild mitral valve regurgitation.   5. The aortic valve is tricuspid. Aortic valve regurgitation is not  visualized.   6. The inferior vena cava is normal in size with greater than 50%  respiratory variability, suggesting right atrial pressure of 3 mmHg.   Comparison(s): No significant change from prior study. 05/08/2021: LVEF  25-30%, akinesis inferoseptal/anteroseptal, apical lateral wall and apex.    CHA2DS2-VASc Score = 3  The patient's score is based upon:  CHF History: 1 HTN History: 0 Diabetes History: 0 Stroke History: 0 Vascular Disease History: 1 Age Score: 0 Gender Score: 1       ASSESSMENT AND PLAN: Paroxysmal Atrial Fibrillation (ICD10:  I48.0) The patient's CHA2DS2-VASc score is 3, indicating a 3.2% annual risk of stroke.   Previously failed amiodarone  (intolerable side effects) and sotalol  (ineffective) S/p dofetilide  loading 12/10-12/13/24 S/p DCCV in ED on 02/11/23 Patient maintaining SR, she is scheduled for afib ablation in March. Continue Eliquis  5 mg BID Continue Toprol  25 mg daily  Secondary Hypercoagulable State (ICD10:  D68.69) The patient is at significant risk for stroke/thromboembolism based upon her CHA2DS2-VASc Score of 3.  Continue Apixaban  (Eliquis ).   CAD S/p STEMI 2017 No anginal symptoms  Chronic HFrEF Ischemic CM, s/p ICD EF 25% NYHA class II GDMT limited by hypotension and yeast infections Followed in the [facility] Fluid status appears stable today   Follow up with Dr Cindie as scheduled.       Daril Kicks PA-C Afib Clinic [facility] [place] [address] [place], [place] [address] [phone]

## 2023-03-09 ENCOUNTER — Encounter (HOSPITAL_COMMUNITY): Payer: Self-pay | Admitting: *Deleted

## 2023-03-09 ENCOUNTER — Other Ambulatory Visit: Payer: Self-pay

## 2023-03-09 ENCOUNTER — Emergency Department (HOSPITAL_COMMUNITY)
Admission: EM | Admit: 2023-03-09 | Discharge: 2023-03-10 | Disposition: A | Discharge status: Home / Self Care | Payer: BC Managed Care – PPO

## 2023-03-09 ENCOUNTER — Emergency Department (HOSPITAL_COMMUNITY): Payer: BC Managed Care – PPO

## 2023-03-09 DIAGNOSIS — I4891 Unspecified atrial fibrillation: Secondary | ICD-10-CM | POA: Diagnosis not present

## 2023-03-09 DIAGNOSIS — I5020 Unspecified systolic (congestive) heart failure: Secondary | ICD-10-CM | POA: Diagnosis not present

## 2023-03-09 DIAGNOSIS — Z79899 Other long term (current) drug therapy: Secondary | ICD-10-CM | POA: Diagnosis not present

## 2023-03-09 DIAGNOSIS — Z7901 Long term (current) use of anticoagulants: Secondary | ICD-10-CM | POA: Insufficient documentation

## 2023-03-09 DIAGNOSIS — R002 Palpitations: Secondary | ICD-10-CM | POA: Diagnosis present

## 2023-03-09 LAB — BASIC METABOLIC PANEL
Anion gap: 13 (ref 5–15)
BUN: 20 mg/dL (ref 6–20)
CO2: 20 mmol/L — ABNORMAL LOW (ref 22–32)
Calcium: 8.8 mg/dL — ABNORMAL LOW (ref 8.9–10.3)
Chloride: 97 mmol/L — ABNORMAL LOW (ref 98–111)
Creatinine, Ser: 1.21 mg/dL — ABNORMAL HIGH (ref 0.44–1.00)
GFR, Estimated: 52 mL/min — ABNORMAL LOW (ref >60)
Glucose, Bld: 107 mg/dL — ABNORMAL HIGH (ref 70–99)
Potassium: 4 mmol/L (ref 3.5–5.1)
Sodium: 130 mmol/L — ABNORMAL LOW (ref 135–145)

## 2023-03-09 LAB — CBC
HCT: 35.5 % — ABNORMAL LOW (ref 36.0–46.0)
Hemoglobin: 11.6 g/dL — ABNORMAL LOW (ref 12.0–15.0)
MCH: 29.7 pg (ref 26.0–34.0)
MCHC: 32.7 g/dL (ref 30.0–36.0)
MCV: 91 fL (ref 80.0–100.0)
Platelets: 258 10*3/uL (ref 150–400)
RBC: 3.9 MIL/uL (ref 3.87–5.11)
RDW: 13.6 % (ref 11.5–15.5)
WBC: 8.6 10*3/uL (ref 4.0–10.5)
nRBC: 0 % (ref 0.0–0.2)

## 2023-03-09 LAB — TROPONIN I (HIGH SENSITIVITY)
Troponin I (High Sensitivity): 6 ng/L (ref <18)
Troponin I (High Sensitivity): 10 ng/L (ref <18)

## 2023-03-09 MED ORDER — SODIUM CHLORIDE 0.9 % IV BOLUS
500.0000 mL | Freq: Once | INTRAVENOUS | Status: AC
  Administered 2023-03-09: 500 mL via INTRAVENOUS

## 2023-03-09 NOTE — ED Triage Notes (Signed)
 Pt reports a tightness in the area of her collar bone, "I feel like I am getting choked". Posterior headache that started since arrival. Nausea. She says her Afib has been on and off today, she did talk to the AFIB clinic at one point today. On eliquis.

## 2023-03-09 NOTE — ED Provider Notes (Signed)
 [place] EMERGENCY DEPARTMENT AT [facility] Provider Note   CSN: [id number] Arrival date & time: 03/09/23  2015     History  Chief Complaint  Patient presents with   Palpitations    Inella Kuwahara is a 58 y.o. female.  58 year old female with past medical history of atrial fibrillation on Tikosyn  and metoprolol  as well as Eliquis  presenting to the emergency department today with palpitations.  The patient's been having intermittent palpitations since yesterday.  She reports that her heart rate has been running in the 100s.  She came to the ER today due to these persistent symptoms.  She is scheduled for an ablation in March.  She was seen here last month and was cardioverted.  She is reporting some mild midsternal chest discomfort that is intermittent in nature.  Is nonpleuritic.  Denies any hemoptysis.   Palpitations      Home Medications Prior to Admission medications   Medication Sig Start Date End Date Taking? Authorizing Provider  acetaminophen  (TYLENOL ) 500 MG tablet Take 1,000 mg by mouth every 6 (six) hours as needed for mild pain.    [provider]  buPROPion  (WELLBUTRIN  XL) 300 MG 24 hr tablet Take 300 mg by mouth daily. 11/07/20   [provider]  Calcium  Carb-Cholecalciferol (CALCIUM  + D3 PO) Take 1 tablet by mouth every morning.    [provider]  Coenzyme Q10 (COQ-10) 200 MG CAPS Take 200 mg by mouth daily.    [provider]  dexlansoprazole  (DEXILANT ) 60 MG capsule TAKE 1 CAPSULE(60 MG) BY MOUTH DAILY 05/09/22   Federico Rosario BROCKS, MD  dofetilide  (TIKOSYN ) 250 MCG capsule Take 1 capsule (250 mcg total) by mouth 2 (two) times daily. 02/06/23   Leverne Charlies Helling, PA-C  ELIQUIS  5 MG TABS tablet TAKE 1 TABLET(5 MG) BY MOUTH TWICE DAILY 02/20/23   Rolan Ezra RAMAN, MD  ferrous sulfate  325 (65 FE) MG EC tablet Take 325 mg by mouth daily.    [provider]  fexofenadine (ALLEGRA) 180 MG tablet Take 180 mg by mouth  daily.    [provider]  furosemide  (LASIX ) 20 MG tablet Take 2 tablets as needed in case of weight gain 2 to 3 lbs in 24 hrs or 5 lbs in 7 days until weight back to baseline. Patient taking differently: Take 40 mg by mouth daily. 01/19/23   Arrien, Mauricio Daniel, MD  metoprolol  succinate (TOPROL -XL) 25 MG 24 hr tablet Take 1 tablet (25 mg total) by mouth at bedtime. 02/06/23   Ursuy, Renee Lynn, PA-C  nitroGLYCERIN  (NITROSTAT ) 0.4 MG SL tablet PLACE 1 TABLET UNDER THE TONGUE EVERY 5 MINUTES AS NEEDED FOR CHEST PAIN 04/13/20   Rolan Ezra RAMAN, MD  rosuvastatin  (CRESTOR ) 20 MG tablet Take 1 tablet (20 mg total) by mouth daily. 08/27/22   Rolan Ezra RAMAN, MD  sertraline  (ZOLOFT ) 25 MG tablet Take 1 tablet (25 mg total) by mouth daily. 01/20/23 02/19/23  Arrien, Elidia Sieving, MD  spironolactone  (ALDACTONE ) 25 MG tablet TAKE 1 TABLET(25 MG) BY MOUTH DAILY 05/19/22   Rolan Ezra RAMAN, MD  thyroid  (ARMOUR) 120 MG tablet Take 120 mg by mouth daily before breakfast.    [provider]  traZODone  (DESYREL ) 50 MG tablet Take 25 mg by mouth at bedtime.    [provider]  triamcinolone  (NASACORT ) 55 MCG/ACT AERO nasal inhaler Place 2 sprays into the nose daily.     [provider]      Allergies  Paxil [paroxetine], Amiodarone , Chlorhexidine  gluconate, Morphine  and codeine, Percocet [oxycodone -acetaminophen ], Cefdinir, and Zolpidem     Review of Systems   Review of Systems  Cardiovascular:  Positive for palpitations.  All other systems reviewed and are negative.   Physical Exam Updated Vital Signs BP 108/77   Pulse 61   Temp 97.9 F (36.6 C) (Oral)   Resp 11   SpO2 100%  Physical Exam Vitals and nursing note reviewed.   Gen: NAD Eyes: PERRL, EOMI HEENT: no oropharyngeal swelling Neck: trachea midline Resp: clear to auscultation bilaterally Card: Tachycardic, irregular, no murmurs, rubs, or gallops Abd: nontender, nondistended Extremities: no calf  tenderness, no edema Vascular: 2+ radial pulses bilaterally, 2+ DP pulses bilaterally Skin: no rashes Psyc: acting appropriately   ED Results / Procedures / Treatments   Labs (all labs ordered are listed, but only abnormal results are displayed) Labs Reviewed  BASIC METABOLIC PANEL - Abnormal; Notable for the following components:      Result Value   Sodium 130 (*)    Chloride 97 (*)    CO2 20 (*)    Glucose, Bld 107 (*)    Creatinine, Ser 1.21 (*)    Calcium  8.8 (*)    GFR, Estimated 52 (*)    All other components within normal limits  CBC - Abnormal; Notable for the following components:   Hemoglobin 11.6 (*)    HCT 35.5 (*)    All other components within normal limits  TROPONIN I (HIGH SENSITIVITY)  TROPONIN I (HIGH SENSITIVITY)    EKG None  Radiology DG Chest Port 1 View Result Date: 03/09/2023 CLINICAL DATA:  Atrial fibrillation EXAM: PORTABLE CHEST 1 VIEW COMPARISON:  02/11/2023 FINDINGS: Lungs are clear.  No pleural effusion or pneumothorax. The heart is top-normal in size.  Left subclavian ICD. IMPRESSION: No acute cardiopulmonary disease. Electronically Signed   By: Pinkie Pebbles M.D.   On: 03/09/2023 21:45    Procedures Procedures    Medications Ordered in ED Medications  sodium chloride  0.9 % bolus 500 mL (500 mLs Intravenous New Bag/Given 03/09/23 2243)    ED Course/ Medical Decision Making/ A&P                                 Medical Decision Making 58 year old female with past medical history of atrial fibrillation on Eliquis  as well as systolic CHF presents the emergency department today with palpitations.  The patient is found to be in atrial fibrillation with RVR on the monitor here.  Her heart rate is in the low 100s.  Her blood pressures at the moment are stable.  I will further evaluate her here with basic labs Wels a troponin to eval for ACS given the chest discomfort.  I will call and discuss her case with cardiology as this is a to be a  recurring issue to see if we would like to try to medically manage or if there is any indication for admission for ablation.  The patient's heart rate remains in the low 100s.  At this point am awaiting cardiology recommendations on treatment options.  The patient's heart rate improved and it does appear on a repeat EKG that she is converted to a sinus rhythm.  I did call discuss her case with cardiology.  They will come and talk to the patient to discuss admission for possible EP evaluation versus going home with outpatient follow-up.  Audiology consult is pending at the  time of signout.  Amount and/or Complexity of Data Reviewed Labs: ordered. Radiology: ordered.           Final Clinical Impression(s) / ED Diagnoses Final diagnoses:  Atrial fibrillation with RVR [facility])    Rx / DC Orders ED Discharge Orders     None         Ula Prentice SAUNDERS, MD 03/09/23 2321

## 2023-03-09 NOTE — ED Provider Notes (Signed)
 4:03 AM Assumed care from Dr. Ula, please see their note for full history, physical and decision making until this point. In brief this is a 58 y.o. year old female who presented to the ED tonight with Palpitations     Afib RVR, scheduled for ablation 2/2 failed meds. Intermittent episodes Afib today, soft pressures but now back in sinus. Pending cards consult and recommendations.   Cards consult. Plan for metoprolol  PRN, rtn to ED for symptoamtic afib, otherwise follow up with Afib clinic in AM. Patient and husband ok with that plan. Described cp, sob, lightheadedness, emesis as reasons to return. otherwise if just palpitations to try relaxation, 5-10 minutes then metoprolol  and if not improved 30 minutes after metop rtn here.   Discharge instructions, including strict return precautions for new or worsening symptoms, given. Patient and/or family verbalized understanding and agreement with the plan as described.   Labs, studies and imaging reviewed by myself and considered in medical decision making if ordered. Imaging interpreted by radiology.  Labs Reviewed  BASIC METABOLIC PANEL - Abnormal; Notable for the following components:      Result Value   Sodium 130 (*)    Chloride 97 (*)    CO2 20 (*)    Glucose, Bld 107 (*)    Creatinine, Ser 1.21 (*)    Calcium  8.8 (*)    GFR, Estimated 52 (*)    All other components within normal limits  CBC - Abnormal; Notable for the following components:   Hemoglobin 11.6 (*)    HCT 35.5 (*)    All other components within normal limits  TROPONIN I (HIGH SENSITIVITY)  TROPONIN I (HIGH SENSITIVITY)    DG Chest Port 1 View  Final Result      No follow-ups on file.    Chastidy Ranker, Selinda, MD 03/10/23 [phone]

## 2023-03-10 ENCOUNTER — Telehealth (HOSPITAL_COMMUNITY): Payer: Self-pay | Admitting: *Deleted

## 2023-03-10 MED ORDER — METOPROLOL TARTRATE 25 MG PO TABS: ORAL_TABLET | ORAL | 1 refills | Status: DC

## 2023-03-10 NOTE — Telephone Encounter (Signed)
 Pt continues to have breakthrough afib despite tikosyn . Pt was in ER overnight did self convert but inquiring if anything PRN she can take for elevated rates to avoid ER. Discussed with Thom Heinrich PA will use PRN metoprolol  tartrate 12.5mg -25mg  BID for rate over 100 if BP can tolerate. RX sent.

## 2023-03-10 NOTE — Consult Note (Signed)
 Cardiology Consultation   Patient ID: [name] MRN: [id number]; DOB: [birth year]  Admit date: 03/09/2023 Date of Consult: 03/10/2023  PCP:  Shona Norleen PEDLAR, MD   [facility] Providers Cardiologist:  Lonni Cash, MD  Electrophysiologist:  OLE ONEIDA HOLTS, MD       Patient Profile:   Catherine Mays is a 58 y.o. female with a history of CAD (s/p STEMI in 08/2015), chronic HFrEF s/p St. Jude ICD in place (recent malfunctioning of RV ICD lead and replacement along with ICD generator replacement in 03/2022), history of GIB, mitral regurgitation, HLD, Stage 3 CKD and PVC's (prior PVC ablation in 03/2017), atrial fibrillation who presents to ED for symptomatic A.Fib.    History of Present Illness:   [name] is unfortunately had a difficult course of symptomatic paroxysmal A-fib.  Recently.  Was admitted back in 01/16/2023 with acute heart failure and A-fib with RVR.  Sotalol  was discontinued at that time she was transition to dofetilide .  She had persistent symptoms in December tachypalpitations, chest pain, shortness of breath, and squeezing sensation in her neck.  Episodes of RVR to the 150s so she was cardioverted in the ED at that time.  Was seen in A-fib clinic on 1/7 was feeling better.  Plan for A-fib ablation in March.  She has been on amiodarone  in the past but could not tolerate due to GI side effects.  She also takes metoprolol  for rate control.  For the last 48 hours she has had persistent typical A-fib symptoms including squeezing sensation in her neck, fatigue, shortness of breath.  Given such she presented to the ED for evaluation.  She has been compliant with her Eliquis , Tikosyn , and metoprolol .  Blood pressures were well-controlled and she spontaneously converted to normal sinus rhythm with improvement in her symptomatology.  Workup was notable for initial EKG demonstrated A-fib with RVR.  Sodium was 130, creatinine 1.2, K4, bicarb 20.  Tropes 6-10.   Hemoglobin and white count unremarkable.  Chest x-ray unremarkable.  We had a long discussion on the limited options for antiarrhythmic therapy for her at this time and that ablation really is her best option moving forward.  And that she is now in sinus rhythm and with resolution in her symptomatology she feels okay going home with follow-up discussions with A-fib clinic tomorrow.  We discussed that may be pill in pocket extra metoprolol  as her best option as a bridge with her Tikosyn  to her ablation in March versus sooner ablation if a date opened up.  Only other option for her retrial of amiodarone  when her HFrEF and prior ischemic disease.   Past Medical History:  Diagnosis Date   AICD (automatic cardioverter/defibrillator) present    St. Jude   Anemia    CAD in native artery    a. late recognition of presentation of STEMI 08/2015 s/p DES to LAD, mild residual mRCA.   Chronic systolic CHF (congestive heart failure) (HCC)    CKD (chronic kidney disease), stage III (HCC)    Depression    Hypertension    Hypotension    a. preventing med titration for HF.   Hypothyroidism 09/24/2015   Ischemic cardiomyopathy    Myocardial infarction [facility]) 08/2015   PAF (paroxysmal atrial fibrillation) (HCC) 09/24/2015   a. dx at time of STEMI 08/2015.   Pre-diabetes    Presence of permanent cardiac pacemaker    patient has ICD   PVC's (premature ventricular contractions)     Past Surgical History:  Procedure Laterality Date   BIOPSY  06/16/2019   Procedure: BIOPSY;  Surgeon: Shaaron Lamar HERO, MD;  Location: [facility];  Service: Endoscopy;;   CARDIAC CATHETERIZATION N/A 09/20/2015   Procedure: Left Heart Cath and Coronary Angiography;  Surgeon: Lonni JONETTA Cash, MD;  Location: [facility] INVASIVE CV LAB;  Service: Cardiovascular;  Laterality: N/A;   CARDIAC CATHETERIZATION N/A 09/20/2015   Procedure: Coronary Stent Intervention;  Surgeon: Lonni JONETTA Cash, MD;  Location: [facility] INVASIVE CV LAB;   Service: Cardiovascular;  Laterality: N/A;   CARDIAC CATHETERIZATION N/A 09/21/2015   Procedure: Left Heart Cath and Coronary Angiography;  Surgeon: Ozell Fell, MD;  Location: [facility] INVASIVE CV LAB;  Service: Cardiovascular;  Laterality: N/A;   CARDIAC CATHETERIZATION N/A 11/26/2015   Procedure: Right Heart Cath;  Surgeon: Ezra GORMAN Shuck, MD;  Location: [facility] INVASIVE CV LAB;  Service: Cardiovascular;  Laterality: N/A;   COLONOSCOPY WITH PROPOFOL  N/A 11/23/2017   Dr. Shaaron: Diverticulosis, internal hemorrhoids, next colonoscopy in 10 years   COLONOSCOPY WITH PROPOFOL  N/A 08/10/2019   Procedure: COLONOSCOPY WITH PROPOFOL ;  Surgeon: Shaaron Lamar HERO, MD;  Diverticulosis in sigmoid colon, nonbleeding internal hemorrhoids, otherwise normal exam.     COLONOSCOPY WITH PROPOFOL  N/A 12/02/2020   Procedure: COLONOSCOPY WITH PROPOFOL ;  Surgeon: Rollin Dover, MD;  Location: [facility] ENDOSCOPY;  Service: Endoscopy;  Laterality: N/A;   CORONARY STENT PLACEMENT  09/20/2015   EP IMPLANTABLE DEVICE N/A 01/23/2016   Procedure: ICD Implant;  Surgeon: Will Gladis Norton, MD;  Location: [facility] INVASIVE CV LAB;  Service: Cardiovascular;  Laterality: N/A;   ESOPHAGOGASTRODUODENOSCOPY (EGD) WITH PROPOFOL  N/A 06/16/2019   Procedure: ESOPHAGOGASTRODUODENOSCOPY (EGD) WITH PROPOFOL ;  Surgeon: Shaaron Lamar HERO, MD;  Normal esophagus, small hiatal hernia, normal examined stomach, normal examined duodenum.  Gastric biopsy with slight chronic inflammation, duodenal biopsy with slight intramucosal Brunner gland hyperplasia.   ESOPHAGOGASTRODUODENOSCOPY (EGD) WITH PROPOFOL  N/A 11/30/2020   Procedure: ESOPHAGOGASTRODUODENOSCOPY (EGD) WITH PROPOFOL ;  Surgeon: Stacia Glendia BRAVO, MD;  Location: [facility] ENDOSCOPY;  Service: Gastroenterology;  Laterality: N/A;   GIVENS CAPSULE STUDY N/A 11/23/2019    Surgeon: Shaaron Lamar HERO, MD; essentially unremarkable except for tiny erosion in the proximal small bowel.   GIVENS CAPSULE STUDY  11/30/2020   Procedure:  GIVENS CAPSULE STUDY;  Surgeon: Stacia Glendia BRAVO, MD;  Location: [facility] ENDOSCOPY;  Service: Gastroenterology;;   ICD GENERATOR CHANGEOUT N/A 04/14/2022   Procedure: ICD GENERATOR CHANGEOUT;  Surgeon: Cindie Ole DASEN, MD;  Location: [facility] INVASIVE CV LAB;  Service: Cardiovascular;  Laterality: N/A;   LEAD EXTRACTION N/A 04/14/2022   Procedure: LEAD EXTRACTION;  Surgeon: Cindie Ole DASEN, MD;  Location: [facility] INVASIVE CV LAB;  Service: Cardiovascular;  Laterality: N/A;   LEFT HEART CATH AND CORONARY ANGIOGRAPHY N/A 12/18/2017   Procedure: LEFT HEART CATH AND CORONARY ANGIOGRAPHY;  Surgeon: Shuck Ezra GORMAN, MD;  Location: [facility] INVASIVE CV LAB;  Service: Cardiovascular;  Laterality: N/A;   PVC ABLATION N/A 04/02/2017   Procedure: PVC ABLATION;  Surgeon: Norton Soyla Gladis, MD;  Location: [facility] INVASIVE CV LAB;  Service: Cardiovascular;  Laterality: N/A;   TEE WITHOUT CARDIOVERSION N/A 10/08/2016   Procedure: TRANSESOPHAGEAL ECHOCARDIOGRAM (TEE);  Surgeon: Shuck Ezra GORMAN, MD;  Location: [facility] ENDOSCOPY;  Service: Cardiovascular;  Laterality: N/A;   THYROIDECTOMY       Home Medications:  Prior to Admission medications   Medication Sig Start Date End Date Taking? Authorizing Provider  acetaminophen  (TYLENOL ) 500 MG tablet Take 1,000 mg by mouth every 6 (six) hours as needed for  mild pain.    [provider]  buPROPion  (WELLBUTRIN  XL) 300 MG 24 hr tablet Take 300 mg by mouth daily. 11/07/20   [provider]  Calcium  Carb-Cholecalciferol (CALCIUM  + D3 PO) Take 1 tablet by mouth every morning.    [provider]  Coenzyme Q10 (COQ-10) 200 MG CAPS Take 200 mg by mouth daily.    [provider]  dexlansoprazole  (DEXILANT ) 60 MG capsule TAKE 1 CAPSULE(60 MG) BY MOUTH DAILY 05/09/22   Federico Rosario BROCKS, MD  dofetilide  (TIKOSYN ) 250 MCG capsule Take 1 capsule (250 mcg total) by mouth 2 (two) times daily. 02/06/23   Leverne Charlies Helling, PA-C  ELIQUIS  5 MG TABS tablet TAKE 1 TABLET(5 MG)  BY MOUTH TWICE DAILY 02/20/23   Rolan Ezra RAMAN, MD  ferrous sulfate  325 (65 FE) MG EC tablet Take 325 mg by mouth daily.    [provider]  fexofenadine (ALLEGRA) 180 MG tablet Take 180 mg by mouth daily.    [provider]  furosemide  (LASIX ) 20 MG tablet Take 2 tablets as needed in case of weight gain 2 to 3 lbs in 24 hrs or 5 lbs in 7 days until weight back to baseline. Patient taking differently: Take 40 mg by mouth daily. 01/19/23   Arrien, Mauricio Daniel, MD  metoprolol  succinate (TOPROL -XL) 25 MG 24 hr tablet Take 1 tablet (25 mg total) by mouth at bedtime. 02/06/23   Ursuy, Renee Lynn, PA-C  nitroGLYCERIN  (NITROSTAT ) 0.4 MG SL tablet PLACE 1 TABLET UNDER THE TONGUE EVERY 5 MINUTES AS NEEDED FOR CHEST PAIN 04/13/20   Rolan Ezra RAMAN, MD  rosuvastatin  (CRESTOR ) 20 MG tablet Take 1 tablet (20 mg total) by mouth daily. 08/27/22   Rolan Ezra RAMAN, MD  sertraline  (ZOLOFT ) 25 MG tablet Take 1 tablet (25 mg total) by mouth daily. 01/20/23 02/19/23  Arrien, Elidia Sieving, MD  spironolactone  (ALDACTONE ) 25 MG tablet TAKE 1 TABLET(25 MG) BY MOUTH DAILY 05/19/22   Rolan Ezra RAMAN, MD  thyroid  (ARMOUR) 120 MG tablet Take 120 mg by mouth daily before breakfast.    [provider]  traZODone  (DESYREL ) 50 MG tablet Take 25 mg by mouth at bedtime.    [provider]  triamcinolone  (NASACORT ) 55 MCG/ACT AERO nasal inhaler Place 2 sprays into the nose daily.     [provider]    Inpatient Medications: Scheduled Meds:  Continuous Infusions:  PRN Meds:   Allergies:    Allergies  Allergen Reactions   Paxil [Paroxetine] Palpitations   Amiodarone  Nausea And Vomiting   Chlorhexidine  Gluconate Itching   Morphine  And Codeine Nausea And Vomiting   Percocet [Oxycodone -Acetaminophen ] Nausea And Vomiting   Cefdinir Nausea Only   Zolpidem  Other (See Comments)    Doesn't work for patient at all    Social History:   Social History   Socioeconomic  History   Marital status: Married    Spouse name: Not on file   Number of children: Not on file   Years of education: Not on file   Highest education level: Not on file  Occupational History   Not on file  Tobacco Use   Smoking status: Never   Smokeless tobacco: Never   Tobacco comments:    Never smoked 01/21/23  Vaping Use   Vaping status: Never Used  Substance and Sexual Activity   Alcohol use: No   Drug use: No   Sexual activity: Not on file  Other Topics Concern   Not on file  Social History Narrative   Not on file   Social Drivers of Health   Financial Resource Strain: Low Risk  (02/10/2020)   Overall Financial Resource Strain (CARDIA)    Difficulty of Paying Living Expenses: Not hard at all  Food Insecurity: No Food Insecurity (02/03/2023)   Hunger Vital Sign    Worried About Running Out of Food in the Last Year: Never true    Ran Out of Food in the Last Year: Never true  Transportation Needs: No Transportation Needs (02/03/2023)   PRAPARE - Administrator, Civil Service (Medical): No    Lack of Transportation (Non-Medical): No  Physical Activity: Insufficiently Active (02/10/2020)   Exercise Vital Sign    Days of Exercise per Week: 3 days    Minutes of Exercise per Session: 30 min  Stress: No Stress Concern Present (02/10/2020)   [employer] of Occupational Health - Occupational Stress Questionnaire    Feeling of Stress : Not at all  Social Connections: Socially Integrated (02/10/2020)   Social Connection and Isolation Panel [NHANES]    Frequency of Communication with Friends and Family: More than three times a week    Frequency of Social Gatherings with Friends and Family: More than three times a week    Attends Religious Services: More than 4 times per year    Active Member of [employer] or Organizations: Yes    Attends Engineer, Structural: More than 4 times per year    Marital Status: Married  Catering Manager Violence: Not At Risk  (02/03/2023)   Humiliation, Afraid, Rape, and Kick questionnaire    Fear of Current or Ex-Partner: No    Emotionally Abused: No    Physically Abused: No    Sexually Abused: No    Family History:    Family History  Problem Relation Age of Onset   Arrhythmia Mother    Lung cancer Mother        bronchiectasis   Heart failure Father    Heart attack Father    Heart attack Brother    Arrhythmia Brother    Colon cancer Neg Hx    Colon polyps Neg Hx      ROS:  Please see the history of present illness.  All other ROS reviewed and negative.     Physical Exam/Data:   Vitals:   03/10/23 0000 03/10/23 0030 03/10/23 0059 03/10/23 0149  BP: 110/79 105/74  113/80  Pulse: (!) 59 (!) 59  64  Resp: 11 14  19   Temp:   97.8 F (36.6 C)   TempSrc:   Oral   SpO2: 99% 98%  100%    Intake/Output Summary (Last 24 hours) at 03/10/2023 0225 Last data filed at 03/09/2023 2350 Gross per 24 hour  Intake 500 ml  Output --  Net 500 ml      03/03/2023    8:52 AM 02/11/2023    2:18 PM 02/11/2023    1:37 PM  Last 3 Weights  Weight (lbs) 154 lb 155 lb 154 lb  Weight (kg) 69.854 kg 70.308 kg 69.854 kg     There is no height or weight on file to calculate BMI.  General:  Well nourished, well developed, in no acute distress HEENT: normal Neck: no JVD Vascular: No carotid bruits; Distal pulses 2+ bilaterally Cardiac:  normal S1, S2; RRR; no murmur  Lungs:  clear to auscultation bilaterally, no wheezing, rhonchi or rales  Abd: soft, nontender, no hepatomegaly  Ext: no edema Musculoskeletal:  No deformities, BUE and BLE strength normal and equal Skin: warm and dry  Neuro:  CNs 2-12 intact, no focal abnormalities noted Psych:  Normal affect   EKG:  The EKG was personally reviewed and demonstrates: A-fib with RVR, LBBB Telemetry:  Telemetry was personally reviewed and demonstrates: Sinus rhythm, PAC, LBBB  Relevant CV Studies: Echo 11/20/22 demonstrated   1. The anterior, apical,  inferoapical and anteroseptal walls are thinned  and akinetic. No LV thrombus by Definity  contrast. Left ventricular  ejection fraction, by estimation, is 25 to 30%. The left ventricle has  severely decreased function. The left ventricle demonstrates regional wall motion abnormalities (see scoring diagram/findings for description). The left ventricular internal cavity size was mildly dilated. Left ventricular diastolic parameters are consistent with Grade I diastolic dysfunction (impaired relaxation).   2. Right ventricular systolic function is mildly reduced. The right  ventricular size is normal. There is normal pulmonary artery systolic  pressure. The estimated right ventricular systolic pressure is 28.8 mmHg.   3. Left atrial size was moderately dilated.   4. The mitral valve is abnormal. Mild mitral valve regurgitation.   5. The aortic valve is tricuspid. Aortic valve regurgitation is not  visualized.   6. The inferior vena cava is normal in size with greater than 50%  respiratory variability, suggesting right atrial pressure of 3 mmHg.   Comparison(s): No significant change from prior study. 05/08/2021: LVEF  25-30%, akinesis inferoseptal/anteroseptal, apical lateral wall and apex.    Laboratory Data:  High Sensitivity Troponin:   Recent Labs  Lab 02/11/23 1418 02/11/23 1644 03/09/23 2103 03/09/23 2313  TROPONINIHS 7 13 6 10      Chemistry Recent Labs  Lab 03/09/23 2103  NA 130*  K 4.0  CL 97*  CO2 20*  GLUCOSE 107*  BUN 20  CREATININE 1.21*  CALCIUM  8.8*  GFRNONAA 52*  ANIONGAP 13    No results for input(s): PROT, ALBUMIN, AST, ALT, ALKPHOS, BILITOT in the last 168 hours. Lipids No results for input(s): CHOL, TRIG, HDL, LABVLDL, LDLCALC, CHOLHDL in the last 168 hours.  Hematology Recent Labs  Lab 03/09/23 2103  WBC 8.6  RBC 3.90  HGB 11.6*  HCT 35.5*  MCV 91.0  MCH 29.7  MCHC 32.7  RDW 13.6  PLT 258   Thyroid  No results for  input(s): TSH, FREET4 in the last 168 hours.  BNPNo results for input(s): BNP, PROBNP in the last 168 hours.  DDimer No results for input(s): DDIMER in the last 168 hours.   Radiology/Studies:  DG Chest Port 1 View Result Date: 03/09/2023 CLINICAL DATA:  Atrial fibrillation EXAM: PORTABLE CHEST 1 VIEW COMPARISON:  02/11/2023 FINDINGS: Lungs are clear.  No pleural effusion or pneumothorax. The heart is top-normal in size.  Left subclavian ICD. IMPRESSION: No acute cardiopulmonary disease. Electronically Signed   By: Pinkie Pebbles M.D.   On: 03/09/2023 21:45   Assessment and Plan:   Symptomatic paroxysmal A-fib with RVR No clear signs or symptoms of decompensation from a heart failure perspective or ongoing ischemia.  She unfortunately is just symptomatic when she has bouts of A-fib.  Given her ischemic disease and heart failure her antiarrhythmic therapies are limited.  She is intolerant of amiodarone  and sotalol .  Tikosyn  and metoprolol  are current therapies.  No need for cardioversion when she self converted.  We discussed pill in pocket additional metoprolol  dosage to try to help break her paroxysmal's of RVR.  Otherwise plan for ablation is best option for her. -Okay to  discharge home -Continue home Tikosyn  and metoprolol  -Will discuss additional metoprolol  therapy with A-fib clinic tomorrow, otherwise plan for ablation in March  2.  CAD No anginal symptoms.  Continue med management  3.  Chronic heart failure with reduced ejection fraction, NYHA II -No evidence of decompensation at this time.  Continue guideline directed therapy as tolerated.  Fluid status appears stable   Risk Assessment/Risk Scores:          CHA2DS2-VASc Score = 3   This indicates a 3.2% annual risk of stroke. The patient's score is based upon: CHF History: 1 HTN History: 0 Diabetes History: 0 Stroke History: 0 Vascular Disease History: 1 Age Score: 0 Gender Score: 1      For questions or  updates, please contact [facility] Please consult [email or web address] for contact info under    Signed, Ozell Bushman, MD  03/10/2023 2:25 AM

## 2023-03-10 NOTE — ED Notes (Signed)
 Patient noted to have converted into NSR with rate in the 60's. EKG captured and given to MD.

## 2023-03-21 ENCOUNTER — Encounter (HOSPITAL_COMMUNITY): Payer: Self-pay

## 2023-03-21 ENCOUNTER — Other Ambulatory Visit: Payer: Self-pay

## 2023-03-21 ENCOUNTER — Emergency Department (HOSPITAL_COMMUNITY)
Admission: EM | Admit: 2023-03-21 | Discharge: 2023-03-21 | Disposition: A | Discharge status: Home / Self Care | Payer: BC Managed Care – PPO | Attending: Emergency Medicine | Admitting: Emergency Medicine

## 2023-03-21 ENCOUNTER — Emergency Department (HOSPITAL_COMMUNITY): Payer: BC Managed Care – PPO

## 2023-03-21 DIAGNOSIS — I4891 Unspecified atrial fibrillation: Secondary | ICD-10-CM | POA: Diagnosis not present

## 2023-03-21 DIAGNOSIS — E039 Hypothyroidism, unspecified: Secondary | ICD-10-CM | POA: Insufficient documentation

## 2023-03-21 DIAGNOSIS — I509 Heart failure, unspecified: Secondary | ICD-10-CM | POA: Diagnosis not present

## 2023-03-21 DIAGNOSIS — Z7901 Long term (current) use of anticoagulants: Secondary | ICD-10-CM | POA: Insufficient documentation

## 2023-03-21 DIAGNOSIS — R002 Palpitations: Secondary | ICD-10-CM | POA: Diagnosis present

## 2023-03-21 DIAGNOSIS — Z79899 Other long term (current) drug therapy: Secondary | ICD-10-CM | POA: Insufficient documentation

## 2023-03-21 DIAGNOSIS — I2510 Atherosclerotic heart disease of native coronary artery without angina pectoris: Secondary | ICD-10-CM | POA: Insufficient documentation

## 2023-03-21 DIAGNOSIS — N189 Chronic kidney disease, unspecified: Secondary | ICD-10-CM | POA: Insufficient documentation

## 2023-03-21 LAB — CBC
HCT: 39 % (ref 36.0–46.0)
Hemoglobin: 11.9 g/dL — ABNORMAL LOW (ref 12.0–15.0)
MCH: 28.7 pg (ref 26.0–34.0)
MCHC: 30.5 g/dL (ref 30.0–36.0)
MCV: 94.2 fL (ref 80.0–100.0)
Platelets: 224 10*3/uL (ref 150–400)
RBC: 4.14 MIL/uL (ref 3.87–5.11)
RDW: 14.2 % (ref 11.5–15.5)
WBC: 6.7 10*3/uL (ref 4.0–10.5)
nRBC: 0 % (ref 0.0–0.2)

## 2023-03-21 LAB — BASIC METABOLIC PANEL
Anion gap: 9 (ref 5–15)
BUN: 19 mg/dL (ref 6–20)
CO2: 25 mmol/L (ref 22–32)
Calcium: 9 mg/dL (ref 8.9–10.3)
Chloride: 107 mmol/L (ref 98–111)
Creatinine, Ser: 1.2 mg/dL — ABNORMAL HIGH (ref 0.44–1.00)
GFR, Estimated: 53 mL/min — ABNORMAL LOW (ref >60)
Glucose, Bld: 103 mg/dL — ABNORMAL HIGH (ref 70–99)
Potassium: 4.4 mmol/L (ref 3.5–5.1)
Sodium: 141 mmol/L (ref 135–145)

## 2023-03-21 LAB — MAGNESIUM: Magnesium: 2 mg/dL (ref 1.7–2.4)

## 2023-03-21 LAB — TROPONIN I (HIGH SENSITIVITY): Troponin I (High Sensitivity): 7 ng/L (ref <18)

## 2023-03-21 LAB — CBG MONITORING, ED: Glucose-Capillary: 131 mg/dL — ABNORMAL HIGH (ref 70–99)

## 2023-03-21 MED ORDER — SODIUM CHLORIDE 0.9 % IV BOLUS
500.0000 mL | Freq: Once | INTRAVENOUS | Status: AC
  Administered 2023-03-21: 500 mL via INTRAVENOUS

## 2023-03-21 MED ORDER — ONDANSETRON HCL 4 MG/2ML IJ SOLN
INTRAMUSCULAR | Status: AC
  Filled 2023-03-21: qty 2

## 2023-03-21 MED ORDER — FENTANYL CITRATE PF 50 MCG/ML IJ SOSY
50.0000 ug | PREFILLED_SYRINGE | Freq: Once | INTRAMUSCULAR | Status: AC
  Administered 2023-03-21: 50 ug via INTRAVENOUS
  Filled 2023-03-21: qty 1

## 2023-03-21 MED ORDER — ONDANSETRON HCL 4 MG/2ML IJ SOLN
4.0000 mg | Freq: Once | INTRAMUSCULAR | Status: AC
Start: 2023-03-21 — End: 2023-03-21
  Administered 2023-03-21: 4 mg via INTRAVENOUS

## 2023-03-21 MED ORDER — ETOMIDATE 2 MG/ML IV SOLN
10.0000 mg | Freq: Once | INTRAVENOUS | Status: AC
  Administered 2023-03-21: 10 mg via INTRAVENOUS
  Filled 2023-03-21: qty 10

## 2023-03-21 NOTE — ED Notes (Signed)
Pt continues to be drowsy but arousable. Unable to follow commands at this time. Observed with sweating. CBG is 131 at this time. Cardiac monitoring in place. Will continue to monitor.

## 2023-03-21 NOTE — ED Provider Notes (Signed)
[place] EMERGENCY DEPARTMENT AT [facility] Provider Note   CSN: [id number] Arrival date & time: 03/21/23  1240     History  Chief Complaint  Patient presents with   Atrial Fibrillation    Catherine Mays is a 58 y.o. female.  Patient is a 58 year old female with a past medical history of A-fib on Tikosyn and Eliquis, CAD, CHF with ICD in place, hypothyroidism and CKD presenting to the emergency department with palpitations.  Patient states that she first started to develop palpitations on Sunday and felt like she was back in A-fib.  She states that she tried to wait out the week as sometimes she would convert back to sinus on her own but has not converted to so prompted her to come to the ER.  She states that she has dyspnea on exertion and will feel lightheaded.  She states she occasionally gets chest tightness and a choking sensation in her throat with associated nausea on exertion.  She states that she has been taking all her medications as prescribed and has not missed any doses of the Tikosyn or Eliquis.  She states that she has had 2 previous cardioversions in the last month.  She states that she is scheduled for an ablation in March.  The history is provided by the patient.  Atrial Fibrillation       Home Medications Prior to Admission medications   Medication Sig Start Date End Date Taking? Authorizing Provider  acetaminophen (TYLENOL) 500 MG tablet Take 1,000 mg by mouth every 6 (six) hours as needed for mild pain.    [provider]  buPROPion (WELLBUTRIN XL) 300 MG 24 hr tablet Take 300 mg by mouth daily. 11/07/20   [provider]  Calcium Carb-Cholecalciferol (CALCIUM + D3 PO) Take 1 tablet by mouth every morning.    [provider]  Coenzyme Q10 (COQ-10) 200 MG CAPS Take 200 mg by mouth daily.    [provider]  dexlansoprazole (DEXILANT) 60 MG capsule TAKE 1 CAPSULE(60 MG) BY MOUTH DAILY 05/09/22   [name], MD   dofetilide (TIKOSYN) 250 MCG capsule Take 1 capsule (250 mcg total) by mouth 2 (two) times daily. 02/06/23   Sheilah Pigeon, PA-C  ELIQUIS 5 MG TABS tablet TAKE 1 TABLET(5 MG) BY MOUTH TWICE DAILY 02/20/23   [name], MD  ferrous sulfate 325 (65 FE) MG EC tablet Take 325 mg by mouth daily.    [provider]  fexofenadine (ALLEGRA) 180 MG tablet Take 180 mg by mouth daily.    [provider]  furosemide (LASIX) 20 MG tablet Take 2 tablets as needed in case of weight gain 2 to 3 lbs in 24 hrs or 5 lbs in 7 days until weight back to baseline. Patient taking differently: Take 40 mg by mouth daily. 01/19/23   Arrien, York Ram, MD  metoprolol succinate (TOPROL-XL) 25 MG 24 hr tablet Take 1 tablet (25 mg total) by mouth at bedtime. 02/06/23   Sheilah Pigeon, PA-C  metoprolol tartrate (LOPRESSOR) 25 MG tablet Take 0.5-1 tablet by mouth twice a day as needed for HRs over 100 03/10/23   Eustace Pen, PA-C  nitroGLYCERIN (NITROSTAT) 0.4 MG SL tablet PLACE 1 TABLET UNDER THE TONGUE EVERY 5 MINUTES AS NEEDED FOR CHEST PAIN 04/13/20   [name], MD  rosuvastatin (CRESTOR) 20 MG tablet Take 1 tablet (20 mg total) by mouth daily. 08/27/22   [name], MD  sertraline (  ZOLOFT) 25 MG tablet Take 1 tablet (25 mg total) by mouth daily. 01/20/23 02/19/23  Arrien, York Ram, MD  spironolactone (ALDACTONE) 25 MG tablet TAKE 1 TABLET(25 MG) BY MOUTH DAILY 05/19/22   [name], MD  thyroid (ARMOUR) 120 MG tablet Take 120 mg by mouth daily before breakfast.    [provider]  traZODone (DESYREL) 50 MG tablet Take 25 mg by mouth at bedtime.    [provider]  triamcinolone (NASACORT) 55 MCG/ACT AERO nasal inhaler Place 2 sprays into the nose daily.     [provider]      Allergies    Paxil [paroxetine], Amiodarone, Chlorhexidine gluconate, Morphine and codeine, Percocet [oxycodone-acetaminophen], Cefdinir, and Zolpidem     Review of Systems   Review of Systems  Physical Exam Updated Vital Signs BP 100/75   Pulse 61   Temp 98.1 F (36.7 C) (Oral)   Resp 20   Ht 5\' 9"  (1.753 m)   Wt 69.9 kg   SpO2 100%   BMI 22.74 kg/m  Physical Exam Vitals and nursing note reviewed.  Constitutional:      General: She is not in acute distress.    Appearance: Normal appearance.  HENT:     Head: Normocephalic.     Nose: Nose normal.     Mouth/Throat:     Mouth: Mucous membranes are moist.     Pharynx: Oropharynx is clear.  Eyes:     Extraocular Movements: Extraocular movements intact.     Conjunctiva/sclera: Conjunctivae normal.  Cardiovascular:     Rate and Rhythm: Tachycardia present. Rhythm irregular.     Heart sounds: Normal heart sounds.  Pulmonary:     Effort: Pulmonary effort is normal.     Breath sounds: Normal breath sounds.  Abdominal:     General: Abdomen is flat.     Palpations: Abdomen is soft.     Tenderness: There is no abdominal tenderness.  Musculoskeletal:        General: Normal range of motion.     Cervical back: Normal range of motion.     Right lower leg: No edema.     Left lower leg: No edema.  Skin:    General: Skin is warm and dry.  Neurological:     General: No focal deficit present.     Mental Status: She is alert and oriented to person, place, and time.  Psychiatric:        Mood and Affect: Mood normal.        Behavior: Behavior normal.     ED Results / Procedures / Treatments   Labs (all labs ordered are listed, but only abnormal results are displayed) Labs Reviewed  BASIC METABOLIC PANEL - Abnormal; Notable for the following components:      Result Value   Glucose, Bld 103 (*)    Creatinine, Ser 1.20 (*)    GFR, Estimated 53 (*)    All other components within normal limits  CBC - Abnormal; Notable for the following components:   Hemoglobin 11.9 (*)    All other components within normal limits  CBG MONITORING, ED - Abnormal; Notable for the following  components:   Glucose-Capillary 131 (*)    All other components within normal limits  MAGNESIUM  TROPONIN I (HIGH SENSITIVITY)  TROPONIN I (HIGH SENSITIVITY)    EKG EKG Interpretation Date/Time:  Saturday March 21 2023 17:05:34 EST Ventricular Rate:  75 PR Interval:  191 QRS Duration:  172 QT Interval:  431 QTC Calculation: 482 R Axis:   -61  Text Interpretation: Sinus rhythm Consider left atrial enlargement Nonspecific IVCD with LAD Left ventricular hypertrophy Anterior Q waves, possibly due to LVH Now in sinus rhythm compared to prior EKG Confirmed by Elayne Snare ([id number]) on 03/21/2023 5:17:33 PM  Radiology DG Chest 1 View Result Date: 03/21/2023 CLINICAL DATA:  History atrial fibrillation. EXAM: CHEST  1 VIEW COMPARISON:  Chest radiograph dated March 09, 2023. FINDINGS: Stable cardiomediastinal silhouette. Stable left subclavian ICD. No focal consolidation, pleural effusion, or pneumothorax. No acute osseous abnormality. IMPRESSION: No acute cardiopulmonary findings. Electronically Signed   By: Hart Robinsons M.D.   On: 03/21/2023 14:05    Procedures .Critical Care  Performed by: Rexford Maus, DO Authorized by: Rexford Maus, DO   Critical care provider statement:    Critical care time (minutes):  30   Critical care was necessary to treat or prevent imminent or life-threatening deterioration of the following conditions:  Cardiac failure   Critical care was time spent personally by me on the following activities:  Development of treatment plan with patient or surrogate, discussions with consultants, evaluation of patient's response to treatment, examination of patient, ordering and review of laboratory studies, ordering and review of radiographic studies, ordering and performing treatments and interventions, pulse oximetry, re-evaluation of patient's condition and review of old charts .Sedation  Date/Time: 03/21/2023 5:15 PM  Performed by: Rexford Maus, DO Authorized by: Rexford Maus, DO   Consent:    Consent obtained:  Written   Consent given by:  Patient   Risks discussed:  Allergic reaction, prolonged hypoxia resulting in organ damage, dysrhythmia, prolonged sedation necessitating reversal, inadequate sedation, respiratory compromise necessitating ventilatory assistance and intubation, nausea and vomiting   Alternatives discussed:  Analgesia without sedation and anxiolysis Universal protocol:    Immediately prior to procedure, a time out was called: yes     Patient identity confirmed:  Verbally with patient Indications:    Procedure performed:  Cardioversion   Procedure necessitating sedation performed by:  Physician performing sedation Pre-sedation assessment:    Time since last food or drink:  9 hr   ASA classification: class 3 - patient with severe systemic disease     Mouth opening:  3 or more finger widths   Thyromental distance:  4 finger widths   Mallampati score:  III - soft palate, base of uvula visible   Neck mobility: normal     Pre-sedation assessments completed and reviewed: pre-procedure airway patency not reviewed, pre-procedure cardiovascular function not reviewed, pre-procedure hydration status not reviewed, pre-procedure mental status not reviewed, pre-procedure nausea and vomiting status not reviewed, pre-procedure pain level not reviewed, pre-procedure respiratory function not reviewed and pre-procedure temperature not reviewed   A pre-sedation assessment was completed prior to the start of the procedure Immediate pre-procedure details:    Reassessment: Patient reassessed immediately prior to procedure     Reviewed: vital signs, relevant labs/tests and NPO status     Verified: bag valve mask available, emergency equipment available, intubation equipment available, IV patency confirmed, oxygen available, reversal medications available and suction available   Procedure details (see MAR for exact  dosages):    Preoxygenation:  Nasal cannula   Sedation:  Etomidate   Intended level of sedation: deep   Analgesia:  Fentanyl   Intra-procedure monitoring:  Blood pressure monitoring, cardiac monitor, continuous capnometry, continuous pulse oximetry, frequent LOC assessments and frequent vital sign checks   Intra-procedure events: emesis  Intra-procedure management:  Airway suctioning (Zofran)   Total Provider sedation time (minutes):  15 Post-procedure details:   A post-sedation assessment was completed following the completion of the procedure.   Attendance: Constant attendance by certified staff until patient recovered     Recovery: Patient returned to pre-procedure baseline     Post-sedation assessments completed and reviewed: post-procedure airway patency not reviewed, post-procedure cardiovascular function not reviewed, post-procedure hydration status not reviewed, post-procedure mental status not reviewed, post-procedure nausea and vomiting status not reviewed, pain score not reviewed, post-procedure respiratory function not reviewed and post-procedure temperature not reviewed     Patient is stable for discharge or admission: yes     Procedure completion:  Tolerated well, no immediate complications .Cardioversion  Date/Time: 03/21/2023 5:16 PM  Performed by: Rexford Maus, DO Authorized by: Rexford Maus, DO   Consent:    Consent obtained:  Written   Consent given by:  Patient   Risks discussed:  Cutaneous burn, death, induced arrhythmia and pain   Alternatives discussed:  No treatment, rate-control medication, alternative treatment and delayed treatment Pre-procedure details:    Cardioversion basis:  Emergent   Rhythm:  Atrial fibrillation   Electrode placement:  Anterior-posterior Patient sedated: Yes. Refer to sedation procedure documentation for details of sedation.  Attempt one:    Cardioversion mode:  Synchronous   Waveform:  Monophasic   Shock  (Joules):  200   Shock outcome:  Conversion to normal sinus rhythm Post-procedure details:    Patient status:  Awake   Patient tolerance of procedure:  Tolerated well, no immediate complications     Medications Ordered in ED Medications  ondansetron (ZOFRAN) 4 MG/2ML injection (  Not Given 03/21/23 1714)  etomidate (AMIDATE) injection 10 mg (10 mg Intravenous Given 03/21/23 1702)  fentaNYL (SUBLIMAZE) injection 50 mcg (50 mcg Intravenous Given 03/21/23 1702)  sodium chloride 0.9 % bolus 500 mL (500 mLs Intravenous New Bag/Given 03/21/23 1708)  ondansetron (ZOFRAN) injection 4 mg (4 mg Intravenous Given 03/21/23 1709)    ED Course/ Medical Decision Making/ A&P Clinical Course as of 03/21/23 1841  Sat Mar 21, 2023  1553 I spoke Dr. Nelly Laurence with cardiology who did agree with cardioversion. Has failed amiodarone in the past. Dr. Nelly Laurence will discuss with patient's EP Dr. Lalla Brothers to see if ablation can be expedited. [VK]  1804 Troponin negative. Symptoms ongoing for one week so single troponin is sufficient. Patient has remained in sinus rhythm for 1 hour. She is stable for discharge home with close outpatient cardiology follow up. [VK]    Clinical Course User Index [VK] Rexford Maus, DO                                 Medical Decision Making This patient presents to the ED with chief complaint(s) of palpitations with pertinent past medical history of A-fib, CAD, CHF, CKD, hypothyroidism which further complicates the presenting complaint. The complaint involves an extensive differential diagnosis and also carries with it a high risk of complications and morbidity.    The differential diagnosis includes arrhythmia, anemia, dehydration, electrolyte abnormality, ACS  Additional history obtained: Additional history obtained from family Records reviewed outpatient cardiology records, prior ED records  ED Course and Reassessment: On patient's arrival she is tachycardic and atrial  fibrillation with rates in the 110s.  She was initially evaluated in triage and had labs and chest x-ray performed.  Labs without acute abnormality.  Troponin is pending.  Magnesium levels added on.  Patient is currently rate controlled in the 90s to 110s however reports she goes up to the 160s on ambulation with worsening symptoms.  Due to patient's recurrent A-fib and compliance on anticoagulation likely is a candidate for cardioversion however with her third episode in about the last month we will plan to discuss with cardiology regarding cardioversion and any medication changes.  The patient will be made NPO.  Independent labs interpretation:  The following labs were independently interpreted: at baseline/within normal range  Independent visualization of imaging: - I independently visualized the following imaging with scope of interpretation limited to determining acute life threatening conditions related to emergency care: CXR, which revealed no acute disease  Consultation: - Consulted or discussed management/test interpretation w/ external professional: cardiology  Consideration for admission or further workup: Patient has no emergent conditions requiring admission or further work-up at this time and is stable for discharge home with primary care and cardiology follow-up  Social Determinants of health: N/A    Amount and/or Complexity of Data Reviewed Labs: ordered.  Risk Prescription drug management.          Final Clinical Impression(s) / ED Diagnoses Final diagnoses:  Atrial fibrillation with [facility])    Rx / DC Orders ED Discharge Orders          Ordered    Amb referral to AFIB Clinic        03/21/23 1559              Rexford Maus, DO 03/21/23 1841

## 2023-03-21 NOTE — ED Triage Notes (Addendum)
Patient reports she is back in her afib.  She is on [place].  Patient reports it has been intermittent.  Reports last cardioversion she came out of it.  Reports feels she has been in afib all week. Rate is 90-150s.  Worse with any type of exertion

## 2023-03-21 NOTE — Discharge Instructions (Signed)
You were seen in the emergency department for your rapid heart rate and your dizziness.  You were in atrial fibrillation here and we did do a cardioversion and you are now back in a normal sinus rhythm.  Your lab work was otherwise normal.  We spoke with the cardiologist and he is planning on reaching out to your electrophysiologist to see if they can expedite your ablation procedure.  You should otherwise follow-up in the A-fib clinic for further management and continue to take your Tikosyn and Eliquis as prescribed until further instructed.  You should return to the emergency department if you have recurrent episodes of A-fib especially if your symptoms do not go away on their own, you are heart rate is extremely high, you have severe chest pain with the A-fib or if you have any other new or concerning symptoms.

## 2023-03-21 NOTE — ED Notes (Signed)
Ambulated to the bathroom and back to room on a steady gait. GCS 15. Alert and oriented x 4. Cleared for DC.

## 2023-03-21 NOTE — ED Provider Triage Note (Signed)
Emergency Medicine Provider Triage Evaluation Note  Catherine Mays , a 58 y.o. female  was evaluated in triage.  Pt complains of PAF. Has a scheduled ablation March 17. Currently taking metoprolol succinate at baseline and metoprolol tartrate for exacerbations. Has not taken today. Taking Eliquis.  States the chest tightness and throat tightness and bilateral leg weakness worse with activity. But Sx resolve with rest.   Currently asymptomatic at rest.  Episodic Sx include headache, blurry vision, shortness of breath, chest tightness, bilaterally LE weakness.  Denies fever,abdominal pain, leg swelling, leg pain  Review of Systems  Positive: See above Negative: See above  Physical Exam  BP 110/84 (BP Location: Right Arm)   Pulse (!) 119   Temp 97.9 F (36.6 C)   Resp 16   Ht 5\' 9"  (1.753 m)   Wt 69.9 kg   SpO2 91%   BMI 22.74 kg/m  Gen:   Awake, no distress   Resp:  Normal effort  MSK:   Moves extremities without difficulty  Other:    Medical Decision Making  Medically screening exam initiated at 1:15 PM.  Appropriate orders placed.  Catherine Mays was informed that the remainder of the evaluation will be completed by another provider, this initial triage assessment does not replace that evaluation, and the importance of remaining in the ED until their evaluation is complete.     Catherine Mays, [place] 03/21/23 1322

## 2023-03-21 NOTE — Sedation Documentation (Signed)
Cardioversion with Dr.Kingsley in process. RT, Advertising account planner at bedside. Suction, ambubag and zoll pads in place. Will continue to monitor.

## 2023-03-23 ENCOUNTER — Ambulatory Visit: Payer: BC Managed Care – PPO | Admitting: Endocrinology

## 2023-03-24 ENCOUNTER — Telehealth (HOSPITAL_COMMUNITY): Payer: Self-pay

## 2023-03-24 NOTE — Telephone Encounter (Signed)
Patient called regarding her medication Metoprolol. Left message for patient to call back.

## 2023-03-24 NOTE — Telephone Encounter (Signed)
Patient called regarding her Metoprolol medication. Instructed patient Per Clint Fenton-PA  make sure her HR is above 100 and top number of her B/P needs to be above 100 before taking Metoprolol Tartrate medication with her A-fib episodes. She is very concerned about her A-fib. Patient advised to continue taking her Tikosyn and she will follow up with Dr. Lalla Brothers on 1/31 @ 4:00 pm. Consulted with patient and she verbalized understanding.

## 2023-03-26 ENCOUNTER — Emergency Department (HOSPITAL_COMMUNITY): Payer: BC Managed Care – PPO

## 2023-03-26 ENCOUNTER — Encounter (HOSPITAL_COMMUNITY): Payer: Self-pay

## 2023-03-26 ENCOUNTER — Other Ambulatory Visit: Payer: Self-pay

## 2023-03-26 ENCOUNTER — Telehealth: Payer: Self-pay | Admitting: Cardiology

## 2023-03-26 ENCOUNTER — Inpatient Hospital Stay (HOSPITAL_COMMUNITY)
Admission: EM | Admit: 2023-03-26 | Discharge: 2023-04-02 | DRG: 277 | Disposition: A | Discharge status: Home / Self Care | Payer: BC Managed Care – PPO | Attending: Cardiology | Admitting: Cardiology

## 2023-03-26 DIAGNOSIS — Z955 Presence of coronary angioplasty implant and graft: Secondary | ICD-10-CM

## 2023-03-26 DIAGNOSIS — R5383 Other fatigue: Secondary | ICD-10-CM | POA: Diagnosis present

## 2023-03-26 DIAGNOSIS — R0789 Other chest pain: Secondary | ICD-10-CM | POA: Diagnosis not present

## 2023-03-26 DIAGNOSIS — E785 Hyperlipidemia, unspecified: Secondary | ICD-10-CM | POA: Diagnosis present

## 2023-03-26 DIAGNOSIS — K219 Gastro-esophageal reflux disease without esophagitis: Secondary | ICD-10-CM | POA: Diagnosis present

## 2023-03-26 DIAGNOSIS — Z825 Family history of asthma and other chronic lower respiratory diseases: Secondary | ICD-10-CM

## 2023-03-26 DIAGNOSIS — N179 Acute kidney failure, unspecified: Secondary | ICD-10-CM | POA: Diagnosis present

## 2023-03-26 DIAGNOSIS — I5022 Chronic systolic (congestive) heart failure: Secondary | ICD-10-CM | POA: Diagnosis present

## 2023-03-26 DIAGNOSIS — I447 Left bundle-branch block, unspecified: Secondary | ICD-10-CM | POA: Diagnosis present

## 2023-03-26 DIAGNOSIS — I480 Paroxysmal atrial fibrillation: Secondary | ICD-10-CM

## 2023-03-26 DIAGNOSIS — I4819 Other persistent atrial fibrillation: Principal | ICD-10-CM | POA: Diagnosis present

## 2023-03-26 DIAGNOSIS — Z801 Family history of malignant neoplasm of trachea, bronchus and lung: Secondary | ICD-10-CM

## 2023-03-26 DIAGNOSIS — I252 Old myocardial infarction: Secondary | ICD-10-CM

## 2023-03-26 DIAGNOSIS — R110 Nausea: Secondary | ICD-10-CM | POA: Diagnosis present

## 2023-03-26 DIAGNOSIS — Z885 Allergy status to narcotic agent status: Secondary | ICD-10-CM

## 2023-03-26 DIAGNOSIS — I2510 Atherosclerotic heart disease of native coronary artery without angina pectoris: Secondary | ICD-10-CM | POA: Diagnosis present

## 2023-03-26 DIAGNOSIS — I255 Ischemic cardiomyopathy: Secondary | ICD-10-CM | POA: Diagnosis present

## 2023-03-26 DIAGNOSIS — I130 Hypertensive heart and chronic kidney disease with heart failure and stage 1 through stage 4 chronic kidney disease, or unspecified chronic kidney disease: Secondary | ICD-10-CM | POA: Diagnosis present

## 2023-03-26 DIAGNOSIS — R000 Tachycardia, unspecified: Secondary | ICD-10-CM | POA: Diagnosis present

## 2023-03-26 DIAGNOSIS — I081 Rheumatic disorders of both mitral and tricuspid valves: Secondary | ICD-10-CM | POA: Diagnosis present

## 2023-03-26 DIAGNOSIS — I959 Hypotension, unspecified: Secondary | ICD-10-CM | POA: Diagnosis present

## 2023-03-26 DIAGNOSIS — R7303 Prediabetes: Secondary | ICD-10-CM | POA: Diagnosis present

## 2023-03-26 DIAGNOSIS — N1831 Chronic kidney disease, stage 3a: Secondary | ICD-10-CM | POA: Diagnosis present

## 2023-03-26 DIAGNOSIS — Z8249 Family history of ischemic heart disease and other diseases of the circulatory system: Secondary | ICD-10-CM

## 2023-03-26 DIAGNOSIS — I493 Ventricular premature depolarization: Secondary | ICD-10-CM | POA: Diagnosis present

## 2023-03-26 DIAGNOSIS — F32A Depression, unspecified: Secondary | ICD-10-CM | POA: Diagnosis present

## 2023-03-26 DIAGNOSIS — Y838 Other surgical procedures as the cause of abnormal reaction of the patient, or of later complication, without mention of misadventure at the time of the procedure: Secondary | ICD-10-CM | POA: Diagnosis present

## 2023-03-26 DIAGNOSIS — Z7901 Long term (current) use of anticoagulants: Secondary | ICD-10-CM

## 2023-03-26 DIAGNOSIS — E039 Hypothyroidism, unspecified: Secondary | ICD-10-CM | POA: Diagnosis present

## 2023-03-26 DIAGNOSIS — Z888 Allergy status to other drugs, medicaments and biological substances status: Secondary | ICD-10-CM

## 2023-03-26 DIAGNOSIS — T82867A Thrombosis of cardiac prosthetic devices, implants and grafts, initial encounter: Secondary | ICD-10-CM | POA: Diagnosis present

## 2023-03-26 DIAGNOSIS — D509 Iron deficiency anemia, unspecified: Secondary | ICD-10-CM | POA: Diagnosis present

## 2023-03-26 DIAGNOSIS — Z79899 Other long term (current) drug therapy: Secondary | ICD-10-CM

## 2023-03-26 DIAGNOSIS — I4891 Unspecified atrial fibrillation: Secondary | ICD-10-CM | POA: Diagnosis present

## 2023-03-26 DIAGNOSIS — I4892 Unspecified atrial flutter: Principal | ICD-10-CM | POA: Diagnosis present

## 2023-03-26 LAB — TROPONIN I (HIGH SENSITIVITY)
Troponin I (High Sensitivity): 7 ng/L (ref <18)
Troponin I (High Sensitivity): 7 ng/L (ref <18)

## 2023-03-26 LAB — BASIC METABOLIC PANEL
Anion gap: 13 (ref 5–15)
BUN: 23 mg/dL — ABNORMAL HIGH (ref 6–20)
CO2: 20 mmol/L — ABNORMAL LOW (ref 22–32)
Calcium: 8.8 mg/dL — ABNORMAL LOW (ref 8.9–10.3)
Chloride: 104 mmol/L (ref 98–111)
Creatinine, Ser: 1.4 mg/dL — ABNORMAL HIGH (ref 0.44–1.00)
GFR, Estimated: 44 mL/min — ABNORMAL LOW (ref >60)
Glucose, Bld: 127 mg/dL — ABNORMAL HIGH (ref 70–99)
Potassium: 4.2 mmol/L (ref 3.5–5.1)
Sodium: 137 mmol/L (ref 135–145)

## 2023-03-26 LAB — CBC
HCT: 37.5 % (ref 36.0–46.0)
Hemoglobin: 11.8 g/dL — ABNORMAL LOW (ref 12.0–15.0)
MCH: 28.9 pg (ref 26.0–34.0)
MCHC: 31.5 g/dL (ref 30.0–36.0)
MCV: 91.7 fL (ref 80.0–100.0)
Platelets: 248 10*3/uL (ref 150–400)
RBC: 4.09 MIL/uL (ref 3.87–5.11)
RDW: 13.9 % (ref 11.5–15.5)
WBC: 6.6 10*3/uL (ref 4.0–10.5)
nRBC: 0 % (ref 0.0–0.2)

## 2023-03-26 NOTE — Telephone Encounter (Signed)
Returned patient's call. She continues to feel very poorly in afib. Also discussed case with her EP Dr Lalla Brothers. Recommend patient present to the ED for evaluation given her low BP and tendency to decompensate quickly. 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 Patient verbalized understanding and gratitude for the call.

## 2023-03-26 NOTE — ED Provider Triage Note (Signed)
Emergency Medicine Provider Triage Evaluation Note  Catherine Mays , a 58 y.o. female  was evaluated in triage.  Pt complains of neck tightness, sob with exertion, uncontrolled a fib for past two days.  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  Was evaluated in ED on 03/21/23 and cardioverted  Review of Systems  Positive: Sob, neck tightness Negative: Cp, pedal edema  Physical Exam  BP 101/79 (BP Location: Right Arm)   Pulse (!) 135   Temp 98.6 F (37 C) (Oral)   Resp 19   Ht 5\' 9"  (1.753 m)   Wt 69.9 kg   SpO2 100%   BMI 22.74 kg/m  Gen:   Awake, no distress   Resp:  Normal effort  MSK:   Moves extremities without difficulty  Other:  No pedal edema BLE  Medical Decision Making  Medically screening exam initiated at 7:02 PM.  Appropriate orders placed.  Catherine Mays was informed that the remainder of the evaluation will be completed by another provider, this initial triage assessment does not replace that evaluation, and the importance of remaining in the ED until their evaluation is complete.  Workup started   [name], [place] 03/26/23 [phone]

## 2023-03-26 NOTE — ED Triage Notes (Signed)
Pt reports she is here for Afib, her cardiologist sent her here for admission. She reports she can feel her heart beating in her neck so she knows she is back in A-fib. She was seen here on 1/25 for afib and was cardioverted. She reports exertional shortness of breath, chest tightness and nausea when walking.

## 2023-03-26 NOTE — Telephone Encounter (Signed)
Patient c/o Palpitations:  High priority if patient c/o lightheadedness, shortness of breath, or chest pain  How long have you had palpitations/irregular HR/ Afib? Are you having the symptoms now? yes  Are you currently experiencing lightheadedness, SOB or CP? sob  Do you have a history of afib (atrial fibrillation) or irregular heart rhythm? yes  Have you checked your BP or HR? (document readings if available):    Are you experiencing any other symptoms? Can barely do anything, unable to walk

## 2023-03-26 NOTE — Telephone Encounter (Signed)
Called patient back about message. Patient stated she feels awful and she knows she is in A. FIB. Patient stated she cannot do anything from just simply walking is hard for patient. Patient want to come in today or earlier tomorrow. Patient's most recent BP 82/62, HR 110. Patient has appointment with Dr. Lalla Brothers tomorrow at 4:00 pm. Patient stated the Tikosyn is not working. Patient stated she is unable to tolerate metoprolol. Patient has metoprolol 12.5 to 25 mg for PRN elevated HR over 100. Patient's BP is low and encouraged patient to drink some fluids, but stated her BP always runs low. Patient stated she has been to the hospital twice to get converted. Patient stated if she cannot be seen earlier she will go back to the ED. Will forward to Dr. Lalla Brothers and A. FIB clinic for advisement.

## 2023-03-27 ENCOUNTER — Ambulatory Visit: Payer: BC Managed Care – PPO | Admitting: Cardiology

## 2023-03-27 ENCOUNTER — Inpatient Hospital Stay (HOSPITAL_COMMUNITY): Payer: BC Managed Care – PPO | Admitting: Anesthesiology

## 2023-03-27 ENCOUNTER — Inpatient Hospital Stay (HOSPITAL_COMMUNITY): Payer: BC Managed Care – PPO

## 2023-03-27 ENCOUNTER — Encounter (HOSPITAL_COMMUNITY)
Admission: EM | Disposition: A | Discharge status: Home / Self Care | Payer: Self-pay | Source: Home / Self Care | Attending: Internal Medicine

## 2023-03-27 ENCOUNTER — Encounter (HOSPITAL_COMMUNITY): Payer: Self-pay | Admitting: Internal Medicine

## 2023-03-27 DIAGNOSIS — I2510 Atherosclerotic heart disease of native coronary artery without angina pectoris: Secondary | ICD-10-CM | POA: Diagnosis present

## 2023-03-27 DIAGNOSIS — T82867A Thrombosis of cardiac prosthetic devices, implants and grafts, initial encounter: Secondary | ICD-10-CM | POA: Diagnosis present

## 2023-03-27 DIAGNOSIS — I081 Rheumatic disorders of both mitral and tricuspid valves: Secondary | ICD-10-CM | POA: Diagnosis present

## 2023-03-27 DIAGNOSIS — I959 Hypotension, unspecified: Secondary | ICD-10-CM | POA: Diagnosis present

## 2023-03-27 DIAGNOSIS — D509 Iron deficiency anemia, unspecified: Secondary | ICD-10-CM | POA: Diagnosis present

## 2023-03-27 DIAGNOSIS — N179 Acute kidney failure, unspecified: Secondary | ICD-10-CM | POA: Diagnosis present

## 2023-03-27 DIAGNOSIS — Z885 Allergy status to narcotic agent status: Secondary | ICD-10-CM | POA: Diagnosis not present

## 2023-03-27 DIAGNOSIS — I4891 Unspecified atrial fibrillation: Secondary | ICD-10-CM | POA: Diagnosis present

## 2023-03-27 DIAGNOSIS — I340 Nonrheumatic mitral (valve) insufficiency: Secondary | ICD-10-CM

## 2023-03-27 DIAGNOSIS — I252 Old myocardial infarction: Secondary | ICD-10-CM | POA: Diagnosis not present

## 2023-03-27 DIAGNOSIS — E039 Hypothyroidism, unspecified: Secondary | ICD-10-CM | POA: Diagnosis present

## 2023-03-27 DIAGNOSIS — I5022 Chronic systolic (congestive) heart failure: Secondary | ICD-10-CM | POA: Diagnosis present

## 2023-03-27 DIAGNOSIS — I4892 Unspecified atrial flutter: Secondary | ICD-10-CM | POA: Diagnosis present

## 2023-03-27 DIAGNOSIS — I4819 Other persistent atrial fibrillation: Secondary | ICD-10-CM

## 2023-03-27 DIAGNOSIS — Y838 Other surgical procedures as the cause of abnormal reaction of the patient, or of later complication, without mention of misadventure at the time of the procedure: Secondary | ICD-10-CM | POA: Diagnosis present

## 2023-03-27 DIAGNOSIS — I130 Hypertensive heart and chronic kidney disease with heart failure and stage 1 through stage 4 chronic kidney disease, or unspecified chronic kidney disease: Secondary | ICD-10-CM | POA: Diagnosis present

## 2023-03-27 DIAGNOSIS — Z888 Allergy status to other drugs, medicaments and biological substances status: Secondary | ICD-10-CM | POA: Diagnosis not present

## 2023-03-27 DIAGNOSIS — Z79899 Other long term (current) drug therapy: Secondary | ICD-10-CM | POA: Diagnosis not present

## 2023-03-27 DIAGNOSIS — N1831 Chronic kidney disease, stage 3a: Secondary | ICD-10-CM | POA: Diagnosis present

## 2023-03-27 DIAGNOSIS — I447 Left bundle-branch block, unspecified: Secondary | ICD-10-CM | POA: Diagnosis present

## 2023-03-27 DIAGNOSIS — Z8249 Family history of ischemic heart disease and other diseases of the circulatory system: Secondary | ICD-10-CM | POA: Diagnosis not present

## 2023-03-27 DIAGNOSIS — Z955 Presence of coronary angioplasty implant and graft: Secondary | ICD-10-CM | POA: Diagnosis not present

## 2023-03-27 DIAGNOSIS — F32A Depression, unspecified: Secondary | ICD-10-CM | POA: Diagnosis present

## 2023-03-27 DIAGNOSIS — Z801 Family history of malignant neoplasm of trachea, bronchus and lung: Secondary | ICD-10-CM | POA: Diagnosis not present

## 2023-03-27 DIAGNOSIS — E785 Hyperlipidemia, unspecified: Secondary | ICD-10-CM | POA: Diagnosis present

## 2023-03-27 DIAGNOSIS — I255 Ischemic cardiomyopathy: Secondary | ICD-10-CM | POA: Diagnosis present

## 2023-03-27 DIAGNOSIS — Z7901 Long term (current) use of anticoagulants: Secondary | ICD-10-CM | POA: Diagnosis not present

## 2023-03-27 HISTORY — PX: TRANSESOPHAGEAL ECHOCARDIOGRAM (CATH LAB): EP1270

## 2023-03-27 HISTORY — PX: CARDIOVERSION: EP1203

## 2023-03-27 LAB — APTT: aPTT: 46 s — ABNORMAL HIGH (ref 24–36)

## 2023-03-27 LAB — HEPARIN LEVEL (UNFRACTIONATED): Heparin Unfractionated: >1.1 [IU]/mL — ABNORMAL HIGH (ref 0.30–0.70)

## 2023-03-27 LAB — ECHO TEE

## 2023-03-27 LAB — MAGNESIUM: Magnesium: 2 mg/dL (ref 1.7–2.4)

## 2023-03-27 SURGERY — TRANSESOPHAGEAL ECHOCARDIOGRAM (TEE) (CATHLAB)
Anesthesia: Monitor Anesthesia Care

## 2023-03-27 MED ORDER — DOFETILIDE 250 MCG PO CAPS
250.0000 ug | ORAL_CAPSULE | Freq: Two times a day (BID) | ORAL | Status: DC
  Administered 2023-03-27 – 2023-04-02 (×13): 250 ug via ORAL
  Filled 2023-03-27 (×13): qty 1

## 2023-03-27 MED ORDER — ONDANSETRON HCL 4 MG/2ML IJ SOLN
INTRAMUSCULAR | Status: DC | PRN
  Administered 2023-03-27: 4 mg via INTRAVENOUS

## 2023-03-27 MED ORDER — COQ-10 200 MG PO CAPS: 200.0000 mg | ORAL_CAPSULE | Freq: Every day | ORAL | Status: DC

## 2023-03-27 MED ORDER — KETAMINE HCL 10 MG/ML IJ SOLN
INTRAMUSCULAR | Status: DC | PRN
  Administered 2023-03-27: 30 mg via INTRAVENOUS

## 2023-03-27 MED ORDER — ACETAMINOPHEN 325 MG PO TABS
650.0000 mg | ORAL_TABLET | ORAL | Status: DC | PRN
  Administered 2023-03-30 – 2023-04-01 (×6): 650 mg via ORAL
  Filled 2023-03-27 (×5): qty 2

## 2023-03-27 MED ORDER — THYROID 60 MG PO TABS
120.0000 mg | ORAL_TABLET | Freq: Every day | ORAL | Status: DC
  Administered 2023-03-27 – 2023-04-02 (×7): 120 mg via ORAL
  Filled 2023-03-27 (×7): qty 2

## 2023-03-27 MED ORDER — SERTRALINE HCL 50 MG PO TABS
25.0000 mg | ORAL_TABLET | Freq: Every day | ORAL | Status: DC
  Administered 2023-03-27 – 2023-04-02 (×7): 25 mg via ORAL
  Filled 2023-03-27 (×7): qty 1

## 2023-03-27 MED ORDER — APIXABAN 5 MG PO TABS: 5.0000 mg | ORAL_TABLET | Freq: Two times a day (BID) | ORAL | Status: DC

## 2023-03-27 MED ORDER — LIDOCAINE 2% (20 MG/ML) 5 ML SYRINGE
INTRAMUSCULAR | Status: DC | PRN
  Administered 2023-03-27: 40 mg via INTRAVENOUS

## 2023-03-27 MED ORDER — KETAMINE HCL 50 MG/5ML IJ SOSY
PREFILLED_SYRINGE | INTRAMUSCULAR | Status: AC
  Filled 2023-03-27: qty 5

## 2023-03-27 MED ORDER — SPIRONOLACTONE 25 MG PO TABS
25.0000 mg | ORAL_TABLET | Freq: Every day | ORAL | Status: DC
  Administered 2023-03-27 – 2023-04-02 (×7): 25 mg via ORAL
  Filled 2023-03-27 (×6): qty 1
  Filled 2023-03-27: qty 2
  Filled 2023-03-27: qty 1

## 2023-03-27 MED ORDER — SODIUM CHLORIDE 0.9 % IV SOLN: INTRAVENOUS | Status: DC | PRN

## 2023-03-27 MED ORDER — PROPOFOL 500 MG/50ML IV EMUL
INTRAVENOUS | Status: DC | PRN
  Administered 2023-03-27: 35 ug/kg/min via INTRAVENOUS

## 2023-03-27 MED ORDER — IPRATROPIUM-ALBUTEROL 0.5-2.5 (3) MG/3ML IN SOLN
RESPIRATORY_TRACT | Status: AC
  Filled 2023-03-27: qty 3

## 2023-03-27 MED ORDER — ONDANSETRON HCL 4 MG/2ML IJ SOLN: 4.0000 mg | Freq: Four times a day (QID) | INTRAMUSCULAR | Status: DC | PRN

## 2023-03-27 MED ORDER — METOPROLOL SUCCINATE ER 25 MG PO TB24
25.0000 mg | ORAL_TABLET | Freq: Every day | ORAL | Status: DC
  Administered 2023-03-27 – 2023-04-01 (×6): 25 mg via ORAL
  Filled 2023-03-27 (×6): qty 1

## 2023-03-27 MED ORDER — DEXTROMETHORPHAN POLISTIREX ER 30 MG/5ML PO SUER: 30.0000 mg | Freq: Two times a day (BID) | ORAL | Status: DC

## 2023-03-27 MED ORDER — SODIUM CHLORIDE 0.9 % IV SOLN: INTRAVENOUS | Status: DC

## 2023-03-27 MED ORDER — FERROUS SULFATE 325 (65 FE) MG PO TABS
325.0000 mg | ORAL_TABLET | Freq: Every day | ORAL | Status: DC
Start: 2023-03-27 — End: 2023-04-02
  Administered 2023-03-27 – 2023-04-02 (×7): 325 mg via ORAL
  Filled 2023-03-27 (×8): qty 1

## 2023-03-27 MED ORDER — DEXTROMETHORPHAN POLISTIREX ER 30 MG/5ML PO SUER
30.0000 mg | Freq: Two times a day (BID) | ORAL | Status: AC | PRN
  Administered 2023-03-27 – 2023-03-28 (×2): 30 mg via ORAL
  Filled 2023-03-27 (×4): qty 5

## 2023-03-27 MED ORDER — IPRATROPIUM-ALBUTEROL 0.5-2.5 (3) MG/3ML IN SOLN
3.0000 mL | Freq: Once | RESPIRATORY_TRACT | Status: AC
Start: 2023-03-27 — End: 2023-03-27

## 2023-03-27 MED ORDER — TRAZODONE HCL 50 MG PO TABS
25.0000 mg | ORAL_TABLET | Freq: Every day | ORAL | Status: DC
Start: 2023-03-27 — End: 2023-04-02
  Administered 2023-03-27 – 2023-04-01 (×6): 25 mg via ORAL
  Filled 2023-03-27 (×6): qty 1

## 2023-03-27 MED ORDER — ROSUVASTATIN CALCIUM 20 MG PO TABS
20.0000 mg | ORAL_TABLET | Freq: Every day | ORAL | Status: DC
  Administered 2023-03-27 – 2023-04-02 (×7): 20 mg via ORAL
  Filled 2023-03-27 (×7): qty 1

## 2023-03-27 MED ORDER — HEPARIN (PORCINE) 25000 UT/250ML-% IV SOLN
1250.0000 [IU]/h | INTRAVENOUS | Status: AC
Start: 2023-03-27 — End: 2023-03-30
  Administered 2023-03-27: 1000 [IU]/h via INTRAVENOUS
  Administered 2023-03-28: 1100 [IU]/h via INTRAVENOUS
  Administered 2023-03-29: 1000 [IU]/h via INTRAVENOUS
  Filled 2023-03-27 (×3): qty 250

## 2023-03-27 SURGICAL SUPPLY — 1 items: PAD DEFIB RADIO PHYSIO CONN (PAD) ×1

## 2023-03-27 NOTE — Consult Note (Addendum)
Advanced Heart Failure Team Consult Note   Primary Physician: Benita Stabile, MD Cardiologist:  Verne Carrow, MD  Reason for Consultation: Acute on Chronic Systolic Heart Failure  HPI:    Catherine Mays is seen today for evaluation of acute on chronic systolic heart failure in the setting of atrial fibrillation at the request of Dr Lalla Brothers.   Catherine Mays is a 58 y.o. female with a history of CAD (s/p STEMI and PCI/DES to LAD to in 7/17), chronic HFrEF s/p St. Jude ICD (recent malfunctioning of RV ICD lead and replacement along with ICD generator replacement in 03/2022), history of GIB, mitral regurgitation, HLD, Stage 3 CKD and high PVC burden (prior PVC ablation in 2/19), and atrial fibrillation.   Initial echo in 8/18 EF 30% with moderate MR (thought to be functional). EF and MR have remained unchanged to date.  Most recently, EF 9/24 35-30%, G1DD, mildly reduced RV, mild MR  Recent admission 11/24 for hypotension and rate controlled AF. Spontaneously converted to NSR. Sotalol was held and readmitted 12/24 for Tikosyn loading. Had waxing/waning PVC burden, which improved with addition of low dose Toprol. She was discharged home, remained off Entresto with low BP.  Seen in the ED 03/09/23 for paroxysmal AF. She was instructed to take Tikosyn and Metoprolol and scheduled for ablation in March. Presented to the ED again 03/21/23 for AF and was successfully cardioverted to NSR. While at home 03/24/23 called Dr. Geannie Risen office to notify them she had gone back into afib and was feeling poorly. An acute visit was made, however, she continued to decompensate in AF and developed hypotension at home. She was instructed to go to the ED for admission for possible device upgrade and AVN ablation.   On admission vitals notable for HR 118, BP 98/84 and 99% on RA. Labs include K 4.2, BUN/Cr 23/1.4, hgb 11.8. CXR with cardiomegaly. EKG showed atrial flutter 117 bpm. She was taken for TEE/DCCV,  however self converted with coughing after intubation.  Now in NSR in 70s.   Home Medications Prior to Admission medications   Medication Sig Start Date End Date Taking? Authorizing Provider  acetaminophen (TYLENOL) 500 MG tablet Take 1,000 mg by mouth every 6 (six) hours as needed for mild pain.   Yes [provider]  buPROPion (WELLBUTRIN XL) 300 MG 24 hr tablet Take 300 mg by mouth daily. 11/07/20  Yes [provider]  Calcium Carb-Cholecalciferol (CALCIUM + D3 PO) Take 1 tablet by mouth every morning.   Yes [provider]  Coenzyme Q10 (COQ-10) 200 MG CAPS Take 200 mg by mouth daily.   Yes [provider]  dexlansoprazole (DEXILANT) 60 MG capsule TAKE 1 CAPSULE(60 MG) BY MOUTH DAILY 05/09/22  Yes [name], MD  dofetilide (TIKOSYN) 250 MCG capsule Take 1 capsule (250 mcg total) by mouth 2 (two) times daily. 02/06/23  Yes Sheilah Pigeon, PA-C  ELIQUIS 5 MG TABS tablet TAKE 1 TABLET(5 MG) BY MOUTH TWICE DAILY 02/20/23  Yes [name], MD  ferrous sulfate 325 (65 FE) MG EC tablet Take 325 mg by mouth daily.   Yes [provider]  fexofenadine (ALLEGRA) 180 MG tablet Take 180 mg by mouth daily.   Yes [provider]  furosemide (LASIX) 20 MG tablet Take 2 tablets as needed in case of weight gain 2 to 3 lbs in 24 hrs or 5 lbs in 7 days until weight back to baseline. Patient taking differently: Take 40 mg  by mouth daily. 01/19/23  Yes Arrien, York Ram, MD  metoprolol succinate (TOPROL-XL) 25 MG 24 hr tablet Take 1 tablet (25 mg total) by mouth at bedtime. 02/06/23  Yes Sheilah Pigeon, PA-C  metoprolol tartrate (LOPRESSOR) 25 MG tablet Take 0.5-1 tablet by mouth twice a day as needed for HRs over 100 03/10/23  Yes Eustace Pen, PA-C  nitroGLYCERIN (NITROSTAT) 0.4 MG SL tablet PLACE 1 TABLET UNDER THE TONGUE EVERY 5 MINUTES AS NEEDED FOR CHEST PAIN 04/13/20  Yes [name], MD  rosuvastatin (CRESTOR) 20 MG tablet  Take 1 tablet (20 mg total) by mouth daily. 08/27/22  Yes [name], MD  sertraline (ZOLOFT) 25 MG tablet Take 1 tablet (25 mg total) by mouth daily. 01/20/23 03/27/23 Yes Arrien, York Ram, MD  spironolactone (ALDACTONE) 25 MG tablet TAKE 1 TABLET(25 MG) BY MOUTH DAILY 05/19/22  Yes [name], MD  thyroid (ARMOUR) 120 MG tablet Take 120 mg by mouth daily before breakfast.   Yes [provider]  triamcinolone (NASACORT) 55 MCG/ACT AERO nasal inhaler Place 2 sprays into the nose daily.    Yes [provider]  traZODone (DESYREL) 50 MG tablet Take 25 mg by mouth at bedtime.    [provider]    Past Medical History: Past Medical History:  Diagnosis Date   AICD (automatic cardioverter/defibrillator) present    St. Jude   Anemia    CAD in native artery    a. late recognition of presentation of STEMI 08/2015 s/p DES to LAD, mild residual mRCA.   Chronic systolic CHF (congestive heart failure) (HCC)    CKD (chronic kidney disease), stage III (HCC)    Depression    Hypertension    Hypotension    a. preventing med titration for HF.   Hypothyroidism 09/24/2015   Ischemic cardiomyopathy    Myocardial infarction [facility]) 08/2015   PAF (paroxysmal atrial fibrillation) (HCC) 09/24/2015   a. dx at time of STEMI 08/2015.   Pre-diabetes    Presence of permanent cardiac pacemaker    patient has ICD   PVC's (premature ventricular contractions)     Past Surgical History: Past Surgical History:  Procedure Laterality Date   BIOPSY  06/16/2019   Procedure: BIOPSY;  Surgeon: Corbin Ade, MD;  Location: [facility] ENDO [facility];  Service: Endoscopy;;   CARDIAC CATHETERIZATION N/A 09/20/2015   Procedure: Left Heart Cath and Coronary Angiography;  Surgeon: Kathleene Hazel, MD;  Location: [facility] INVASIVE CV LAB;  Service: Cardiovascular;  Laterality: N/A;   CARDIAC CATHETERIZATION N/A 09/20/2015   Procedure: Coronary Stent Intervention;  Surgeon: Kathleene Hazel,  MD;  Location: [facility] INVASIVE CV LAB;  Service: Cardiovascular;  Laterality: N/A;   CARDIAC CATHETERIZATION N/A 09/21/2015   Procedure: Left Heart Cath and Coronary Angiography;  Surgeon: Tonny Bollman, MD;  [facility] INVAS[facility] CV LAB;  Service: Cardiovascular;  Laterality: N/A;   CARDIAC CATHETERIZATION N/A 11/26/2015   Procedure: Right Heart Cath;  Surgeon: [name], MD;  Location: [facility] INVASIVE CV LAB;  Service: Cardiovascular;  Laterality: N/A;   COLONOSCOPY WITH PROPOFOL N/A 11/23/2017   Dr. Jena Gauss: Diverticulosis, internal hemorrhoids, next colonoscopy in 10 years   COLONOSCOPY WITH PROPOFOL N/A 08/10/2019   Procedure: COLONOSCOPY WITH PROPOFOL;  Surgeon: Corbin Ade, MD;  Diverticulosis in sigmoid colon, nonbleeding internal hemorrhoids, otherwise normal exam.     COLONOSCOPY WITH PROPOFOL N/A 12/02/2020   Procedure: COLONOSCOPY WITH PROPOFOL;  Surgeon: Jeani Hawking, MD;  Location: [facility] ENDOSCOPY;  Service: Endoscopy;  Laterality: N/A;   CORONARY STENT PLACEMENT  09/20/2015   EP IMPLANTABLE DEVICE N/A 01/23/2016   Procedure: ICD Implant;  Surgeon: Will Jorja Loa, MD;  Location: [facility] INVASIVE CV LAB;  Service: Cardiovascular;  Laterality: N/A;   ESOPHAGOGASTRODUODENOSCOPY (EGD) WITH PROPOFOL N/A 06/16/2019   Procedure: ESOPHAGOGASTRODUODENOSCOPY (EGD) WITH PROPOFOL;  Surgeon: Corbin Ade, MD;  Normal esophagus, small hiatal hernia, normal examined stomach, normal examined duodenum.  Gastric biopsy with slight chronic inflammation, duodenal biopsy with slight intramucosal Brunner gland hyperplasia.   ESOPHAGOGASTRODUODENOSCOPY (EGD) WITH PROPOFOL N/A 11/30/2020   Procedure: ESOPHAGOGASTRODUODENOSCOPY (EGD) WITH PROPOFOL;  Surgeon: Jenel Lucks, MD;  Location: [facility] ENDOSCOPY;  Service: Gastroenterology;  Laterality: N/A;   GIVENS CAPSULE STUDY N/A 11/23/2019    Surgeon: Corbin Ade, MD; essentially unremarkable except for tiny erosion in the proximal small bowel.   GIVENS  CAPSULE STUDY  11/30/2020   Procedure: GIVENS CAPSULE STUDY;  Surgeon: Jenel Lucks, MD;  Location: [facility] ENDOSCOPY;  Service: Gastroenterology;;   ICD GENERATOR CHANGEOUT N/A 04/14/2022   Procedure: ICD GENERATOR CHANGEOUT;  Surgeon: Lanier Prude, MD;  Location: [facility] INVASIVE CV LAB;  Service: Cardiovascular;  Laterality: N/A;   LEAD EXTRACTION N/A 04/14/2022   Procedure: LEAD EXTRACTION;  Surgeon: Lanier Prude, MD;  Location: [facility] INVASIVE CV LAB;  Service: Cardiovascular;  Laterality: N/A;   LEFT HEART CATH AND CORONARY ANGIOGRAPHY N/A 12/18/2017   Procedure: LEFT HEART CATH AND CORONARY ANGIOGRAPHY;  Surgeon: [name], MD;  Location: [facility] INVASIVE CV LAB;  Service: Cardiovascular;  Laterality: N/A;   PVC ABLATION N/A 04/02/2017   Procedure: PVC ABLATION;  Surgeon: Regan Lemming, MD;  Location: [facility] INVASIVE CV LAB;  Service: Cardiovascular;  Laterality: N/A;   TEE WITHOUT CARDIOVERSION N/A 10/08/2016   Procedure: TRANSESOPHAGEAL ECHOCARDIOGRAM (TEE);  Surgeon: [name], MD;  Location: [facility] ENDOSCOPY;  Service: Cardiovascular;  Laterality: N/A;   THYROIDECTOMY      Family History: Family History  Problem Relation Age of Onset   Arrhythmia Mother    Lung cancer Mother        bronchiectasis   Heart failure Father    Heart attack Father    Heart attack Brother    Arrhythmia Brother    Colon cancer Neg Hx    Colon polyps Neg Hx     Social History: Social History   Socioeconomic History   Marital status: Married    Spouse name: Not on file   Number of children: Not on file   Years of education: Not on file   Highest education level: Not on file  Occupational History   Not on file  Tobacco Use   Smoking status: Never   Smokeless tobacco: Never   Tobacco comments:    Never smoked 01/21/23  Vaping Use   Vaping status: Never Used  Substance and Sexual Activity   Alcohol use: No   Drug use: No   Sexual activity: Not on file  Other Topics Concern    Not on file  Social History Narrative   Not on file   Social Drivers of Health   Financial Resource Strain: Low Risk  (02/10/2020)   Overall Financial Resource Strain (CARDIA)    Difficulty of Paying Living Expenses: Not hard at all  Food Insecurity: No Food Insecurity (02/03/2023)   Hunger Vital Sign    Worried About Running Out of Food in the Last Year: Never true    Ran Out of Food in  the Last Year: Never true  Transportation Needs: No Transportation Needs (02/03/2023)   PRAPARE - Administrator, Civil Service (Medical): No    Lack of Transportation (Non-Medical): No  Physical Activity: Insufficiently Active (02/10/2020)   Exercise Vital Sign    Days of Exercise per Week: 3 days    Minutes of Exercise per Session: 30 min  Stress: No Stress Concern Present (02/10/2020)   [employer] of Occupational Health - Occupational Stress Questionnaire    Feeling of Stress : Not at all  Social Connections: Socially Integrated (02/10/2020)   Social Connection and Isolation Panel [NHANES]    Frequency of Communication with Friends and Family: More than three times a week    Frequency of Social Gatherings with Friends and Family: More than three times a week    Attends Religious Services: More than 4 times per year    Active Member of [employer] or Organizations: Yes    Attends Banker Meetings: More than 4 times per year    Marital Status: Married    Allergies:  Allergies  Allergen Reactions   Paxil [Paroxetine] Palpitations   Amiodarone Nausea And Vomiting   Chlorhexidine Gluconate Itching   Morphine And Codeine Nausea And Vomiting   Percocet [Oxycodone-Acetaminophen] Nausea And Vomiting   Cefdinir Nausea Only   Zolpidem Other (See Comments)    Doesn't work for patient at all    Objective:    Vital Signs:   Temp:  [97.1 F (36.2 C)-98.6 F (37 C)] 97.8 F (36.6 C) (01/31 1202) Pulse Rate:  [70-135] 81 (01/31 1210) Resp:  [14-21] 21 (01/31  1210) BP: (94-119)/(62-101) 94/74 (01/31 1210) SpO2:  [95 %-100 %] 97 % (01/31 1210) Weight:  [69.9 kg] 69.9 kg (01/30 1816)    Weight change: Filed Weights   03/26/23 1816  Weight: 69.9 kg    Intake/Output:   Intake/Output Summary (Last 24 hours) at 03/27/2023 1301 Last data filed at 03/27/2023 1204 Gross per 24 hour  Intake 400 ml  Output --  Net 400 ml      Physical Exam    General: Well appearing. No distress on RA Cardiac: JVP not visible. S1 and S2 present. No murmurs or rub. Resp: Lung sounds clear and equal B/L Extremities: Warm and dry. No rash, cyanosis.  No edema.  Neuro: Alert and oriented x3. Affect pleasant. Moves all extremities without difficulty.  Telemetry   NSR in 70s (personally reviewed)  EKG    Sinus rhythm 87 bpm with wide LBBB (personally reviewed)  Labs   Basic Metabolic Panel: Recent Labs  Lab 03/21/23 1313 03/21/23 1628 03/26/23 1918 03/27/23 0551  NA 141  --  137  --   K 4.4  --  4.2  --   CL 107  --  104  --   CO2 25  --  20*  --   GLUCOSE 103*  --  127*  --   BUN 19  --  23*  --   CREATININE 1.20*  --  1.40*  --   CALCIUM 9.0  --  8.8*  --   MG  --  2.0  --  2.0    Liver Function Tests: No results for input(s): "AST", "ALT", "ALKPHOS", "BILITOT", "PROT", "ALBUMIN" in the last 168 hours. No results for input(s): "LIPASE", "AMYLASE" in the last 168 hours. No results for input(s): "AMMONIA" in the last 168 hours.  CBC: Recent Labs  Lab 03/21/23 1313 03/26/23 1918  WBC 6.7 6.6  HGB 11.9* 11.8*  HCT 39.0 37.5  MCV 94.2 91.7  PLT 224 248    Cardiac Enzymes: No results for input(s): "CKTOTAL", "CKMB", "CKMBINDEX", "TROPONINI" in the last 168 hours.  BNP: BNP (last 3 results) Recent Labs    01/17/23 0455 02/09/23 1041 02/11/23 1418  BNP 1,308.6* 483.2* 1,393.2*    ProBNP (last 3 results) No results for input(s): "PROBNP" in the last 8760 hours.   CBG: Recent Labs  Lab 03/21/23 1714  GLUCAP 131*     Coagulation Studies: No results for input(s): "LABPROT", "INR" in the last 72 hours.   Imaging   EP STUDY Result Date: 03/27/2023 See surgical note for result.  DG Chest 1 View Result Date: 03/26/2023 CLINICAL DATA:  Shortness of breath. EXAM: CHEST  1 VIEW COMPARISON:  03/21/2023. FINDINGS: The heart is enlarged and mediastinal contours are within normal limits. No consolidation, effusion, or pneumothorax. A stable ICD is present over the left chest. No acute osseous abnormality is seen. IMPRESSION: No active disease. Electronically Signed   By: Thornell Sartorius M.D.   On: 03/26/2023 20:03   Medications:    Current Medications:  [MAR Hold] dofetilide  250 mcg Oral BID   [MAR Hold] ferrous sulfate  325 mg Oral Daily   ipratropium-albuterol  3 mL Nebulization Once   [MAR Hold] metoprolol succinate  25 mg Oral QHS   [MAR Hold] rosuvastatin  20 mg Oral Daily   [MAR Hold] sertraline  25 mg Oral Daily   [MAR Hold] spironolactone  25 mg Oral Daily   [MAR Hold] thyroid  120 mg Oral QAC breakfast   [MAR Hold] traZODone  25 mg Oral QHS    Infusions:  sodium chloride     heparin 1,000 Units/hr (03/27/23 0856)    Patient Profile   Catherine Mays is a 58 y.o. female with a history of CAD (s/p STEMI and PCI/DES to LAD to in 7/17), chronic HFrEF s/p St. Jude ICD (recent malfunctioning of RV ICD lead and replacement along with ICD generator replacement in 03/2022), history of GIB, mitral regurgitation, HLD, Stage 3 CKD and high PVC burden (prior PVC ablation in 2/19), and atrial fibrillation.   Admitted with AF.   Assessment/Plan   1. Atrial fibrillation/flutter - previously failed sotalol and amio d/t SE. She was started on Tikosyn 12/24 - multiple recent ED visits for unstable AF this month requiring DCCV x1  - presenting with hypotension and AF RVR - taken for TEE/CV today, however self-converted after intubation - EP following for device upgrade to CRT-D (with LBBB) and possible  AVN ablation - continue Toprol XL 25 mg daily at bedtime - continue Tikosyn 250 mcg bid - continue Eliquis 5 mg bid      2. Chronic systolic CHF - ischemic cardiomyopathy - echo 11/2015 with EF 25% and LAD- territory scar. EF has remained stable at 25-30% since.   - NYHA II in NSR. Appears euvolemic on exam. - no need for diuresis at this time - continue spironolactone 25 mg daily   - continue Toprol XL 25 mg at bedtime - no ARB/ARNI with baseline low BP - no SLGT2i due to yeast infections  3. AKI on CKD 3a - mild Cr elevation in the setting of AF - b/l Cr ~1.1-1.2 - 1.40 today  4. CAD  - s/p anterior STEMI in 7/17 with DES to LAD. Cath in 10/19 with no obstructive CAD. - continue eliquis 5 mg bid.   - continue crestor  5. Mitral  regurgitation - moderate to severe on 10/17 echo and severe on 11/17 - echo 9/24 noted to be mild.  6. PVCs - s/p PVC ablation in 2/19. Sotalol started for PVC recurrence post-ablation - Now off Sotalol for Tikosyn treatment as above #1 - Continue Toprol 25 mg daily  7. Hyperlipidemia - continue crestor   8. Fe deficiency Anemia - H/o GI bleeding - continue iron supp - hgb 11.8 on admit. CTM  Length of Stay: 0  [place] Lee, NP  03/27/2023, 1:01 PM  Advanced Heart Failure Team Pager [phone] (M-F; 7a - 5p)  Please contact [facility] for night-coverage after hours (4p -7a ) and weekends on [email or web address]  Patient seen and examined with the above-signed Advanced Practice Provider and/or Housestaff. I personally reviewed laboratory data, imaging studies and relevant notes. I independently examined the patient and formulated the important aspects of the plan. I have edited the note to reflect any of my changes or salient points. I have personally discussed the plan with the patient and/or family.  58 y/o woman with systolic due to prior anterior MI, systolic HF 25%, LBBB now admitted with recurrent symptomatic AF.despite Tikosyn (failed amio and  sotalol)  Previously had been planned for AF ablation   Now admitted with recurrent symptomatic AF. Cardioverted with coughing prior to TEE/DC-CV/   Denies recent edema, CP, orthopnea or PND  Seen by Ep with plan for AVN ablation and device upgrade to CRT-D  General:  Well appearing. No resp difficulty HEENT: normal Neck: supple. no JVD. Carotids 2+ bilat; no bruits. No lymphadenopathy or thryomegaly appreciated. Cor: PMI nondisplaced. Regular rate & rhythm. No rubs, gallops or murmurs. Lungs: clear Abdomen: soft, nontender, nondistended. No hepatosplenomegaly. No bruits or masses. Good bowel sounds. Extremities: no cyanosis, clubbing, rash, edema Neuro: alert & orientedx3, cranial nerves grossly intact. moves all 4 extremities w/o difficulty. Affect pleasant  She continues to struggle with recurrent, symptomatic AF. Has failed multiple AADs.  Has been seen by EP and planning for AVN ablation and CRT-D. I have discussed with Dr. Shirlee Latch and wonder whether starting with an AF ablation may be a first option. Dr. Shirlee Latch will d/w Dr. Lalla Brothers.   HF currently stable. Continue current regimen.   Arvilla Meres, MD  5:10 PM

## 2023-03-27 NOTE — Anesthesia Postprocedure Evaluation (Addendum)
Anesthesia Post Note  Patient: Catherine Mays  Procedure(s) Performed: TRANSESOPHAGEAL ECHOCARDIOGRAM CARDIOVERSION     Patient location during evaluation: Cath Lab Anesthesia Type: MAC Level of consciousness: awake and alert Pain management: pain level controlled Vital Signs Assessment: post-procedure vital signs reviewed and stable Respiratory status: spontaneous breathing, nonlabored ventilation and respiratory function stable Cardiovascular status: stable and blood pressure returned to baseline Postop Assessment: no apparent nausea or vomiting Anesthetic complications: no  No notable events documented.  Last Vitals:  Vitals:   03/27/23 1202 03/27/23 1210  BP: 101/62 94/74  Pulse: 70 81  Resp: 18 (!) 21  Temp: 36.6 C   SpO2: 95% 97%    Last Pain:  Vitals:   03/27/23 1210  TempSrc:   PainSc: 0-No pain                 Alesa Echevarria,W. EDMOND

## 2023-03-27 NOTE — Progress Notes (Signed)
Spoke to NP Canary Brim about pts persistent cough and she stated she would look into getting her something for it. Pt updated

## 2023-03-27 NOTE — Anesthesia Preprocedure Evaluation (Addendum)
Anesthesia Evaluation  Patient identified by MRN, date of birth, ID band Patient awake    Reviewed: Allergy & Precautions, H&P , NPO status , Patient's Chart, lab work & pertinent test results  Airway Mallampati: II  TM Distance: >3 FB Neck ROM: Full    Dental no notable dental hx. (+) Teeth Intact, Dental Advisory Given   Pulmonary neg pulmonary ROS   Pulmonary exam normal breath sounds clear to auscultation       Cardiovascular hypertension, Pt. on medications and Pt. on home beta blockers + CAD, + Past MI, + Cardiac Stents and +CHF  + pacemaker + Cardiac Defibrillator  Rhythm:Irregular Rate:Tachycardia     Neuro/Psych    Depression    negative neurological ROS     GI/Hepatic Neg liver ROS,GERD  Medicated,,  Endo/Other  Hypothyroidism    Renal/GU Renal InsufficiencyRenal disease  negative genitourinary   Musculoskeletal   Abdominal   Peds  Hematology  (+) Blood dyscrasia, anemia   Anesthesia Other Findings   Reproductive/Obstetrics negative OB ROS                             Anesthesia Physical Anesthesia Plan  ASA: 4  Anesthesia Plan: General   Post-op Pain Management: Minimal or no pain anticipated   Induction: Intravenous  PONV Risk Score and Plan: 3 and Propofol infusion and Treatment may vary due to age or medical condition  Airway Management Planned: Natural Airway and Simple Face Mask  Additional Equipment:   Intra-op Plan:   Post-operative Plan:   Informed Consent: I have reviewed the patients History and Physical, chart, labs and discussed the procedure including the risks, benefits and alternatives for the proposed anesthesia with the patient or authorized representative who has indicated his/her understanding and acceptance.     Dental advisory given  Plan Discussed with: CRNA  Anesthesia Plan Comments:        Anesthesia Quick Evaluation

## 2023-03-27 NOTE — Progress Notes (Signed)
PHARMACY - ANTICOAGULATION CONSULT NOTE  Pharmacy Consult for heparin Indication: atrial fibrillation  Allergies  Allergen Reactions   Paxil [Paroxetine] Palpitations   Amiodarone Nausea And Vomiting   Chlorhexidine Gluconate Itching   Morphine And Codeine Nausea And Vomiting   Percocet [Oxycodone-Acetaminophen] Nausea And Vomiting   Cefdinir Nausea Only   Zolpidem Other (See Comments)    Doesn't work for patient at all    Patient Measurements: Height: 5\' 9"  (175.3 cm) Weight: 69 kg (152 lb 3.2 oz) IBW/kg (Calculated) : 66.2 Heparin Dosing Weight: 69 Kg  Vital Signs: Temp: 99.6 F (37.6 C) (01/31 1548) Temp Source: Oral (01/31 1548) BP: 112/78 (01/31 1548) Pulse Rate: 80 (01/31 1548)  Labs: Recent Labs    03/26/23 1918 03/26/23 2209 03/27/23 1601  HGB 11.8*  --   --   HCT 37.5  --   --   PLT 248  --   --   APTT  --   --  46*  HEPARINUNFRC  --   --  >1.10*  CREATININE 1.40*  --   --   TROPONINIHS 7 7  --     Estimated Creatinine Clearance: 46.3 mL/min (A) (by C-G formula based on SCr of 1.4 mg/dL (H)).   Medical History: Past Medical History:  Diagnosis Date   AICD (automatic cardioverter/defibrillator) present    St. Jude   Anemia    CAD in native artery    a. late recognition of presentation of STEMI 08/2015 s/p DES to LAD, mild residual mRCA.   Chronic systolic CHF (congestive heart failure) (HCC)    CKD (chronic kidney disease), stage III (HCC)    Depression    Hypertension    Hypotension    a. preventing med titration for HF.   Hypothyroidism 09/24/2015   Ischemic cardiomyopathy    Myocardial infarction [facility]) 08/2015   PAF (paroxysmal atrial fibrillation) (HCC) 09/24/2015   a. dx at time of STEMI 08/2015.   Pre-diabetes    Presence of permanent cardiac pacemaker    patient has ICD   PVC's (premature ventricular contractions)      Assessment: 61 yoF presenting to the ED with atrial fibrillation. Patient reports taking Eliquis prior to  admission (LD 1/30 PM) CBC stable. Pharmacy consulted to dose heparin infusion.   1/31 PM update: HL >1.10 aPTT 46 seconds No signs of bleeding or issues with the hep gtt   Goal of Therapy:  Heparin level 0.3-0.7 units/ml aPTT 66-102 Monitor platelets by anticoagulation protocol: Yes   Plan:  Increase heparin infusion to 1250 units/hr Check aPTT / HL at 0100 Check aPTT/ anti-Xa level in 8 hours and daily while on heparin Continue to monitor H&H and platelets    Lucianna Ostlund BS, PharmD, BCPS Clinical Pharmacist 03/27/2023 5:03 PM  Contact: [phone] after 3 PM  "Be curious, not judgmental..." -Debbora Dus

## 2023-03-27 NOTE — Plan of Care (Signed)

## 2023-03-27 NOTE — ED Provider Notes (Signed)
[place] EMERGENCY DEPARTMENT AT [facility] Provider Note   CSN: [id number] Arrival date & time: 03/26/23  1758     History  Chief Complaint  Patient presents with   Irregular Heart Beat    Catherine Mays is a 58 y.o. female.  HPI     This is a 58 year old female who presents with atrial fibrillation.  Patient reports she has had difficulty controlling her atrial fibrillation since November.  She has been in and out of the emergency department and has had at least 2 cardioversions.  She states that she knows she is in A-fib and has felt short of breath with exertion and has had some neck tightness.  She called her cardiologist and was referred here to the emergency department for admission.  I reviewed her notes.  Recommend for possible admission for device upgrade and ablation.  Previously scheduled for an ablation in March.  She is not having any chest pain but does report ongoing shortness of breath and generally not feeling well.  She does not tolerate metoprolol.  She has been taking her Tikosyn but it does not seem to be helping.  Home Medications Prior to Admission medications   Medication Sig Start Date End Date Taking? Authorizing Provider  acetaminophen (TYLENOL) 500 MG tablet Take 1,000 mg by mouth every 6 (six) hours as needed for mild pain.    [provider]  buPROPion (WELLBUTRIN XL) 300 MG 24 hr tablet Take 300 mg by mouth daily. 11/07/20   [provider]  Calcium Carb-Cholecalciferol (CALCIUM + D3 PO) Take 1 tablet by mouth every morning.    [provider]  Coenzyme Q10 (COQ-10) 200 MG CAPS Take 200 mg by mouth daily.    [provider]  dexlansoprazole (DEXILANT) 60 MG capsule TAKE 1 CAPSULE(60 MG) BY MOUTH DAILY 05/09/22   [name], MD  dofetilide (TIKOSYN) 250 MCG capsule Take 1 capsule (250 mcg total) by mouth 2 (two) times daily. 02/06/23   Sheilah Pigeon, PA-C  ELIQUIS 5 MG TABS tablet TAKE 1 TABLET(5 MG)  BY MOUTH TWICE DAILY 02/20/23   [name], MD  ferrous sulfate 325 (65 FE) MG EC tablet Take 325 mg by mouth daily.    [provider]  fexofenadine (ALLEGRA) 180 MG tablet Take 180 mg by mouth daily.    [provider]  furosemide (LASIX) 20 MG tablet Take 2 tablets as needed in case of weight gain 2 to 3 lbs in 24 hrs or 5 lbs in 7 days until weight back to baseline. Patient taking differently: Take 40 mg by mouth daily. 01/19/23   Arrien, York Ram, MD  metoprolol succinate (TOPROL-XL) 25 MG 24 hr tablet Take 1 tablet (25 mg total) by mouth at bedtime. 02/06/23   Sheilah Pigeon, PA-C  metoprolol tartrate (LOPRESSOR) 25 MG tablet Take 0.5-1 tablet by mouth twice a day as needed for HRs over 100 03/10/23   Eustace Pen, PA-C  nitroGLYCERIN (NITROSTAT) 0.4 MG SL tablet PLACE 1 TABLET UNDER THE TONGUE EVERY 5 MINUTES AS NEEDED FOR CHEST PAIN 04/13/20   [name], MD  rosuvastatin (CRESTOR) 20 MG tablet Take 1 tablet (20 mg total) by mouth daily. 08/27/22   [name], MD  sertraline (ZOLOFT) 25 MG tablet Take 1 tablet (25 mg total) by mouth daily. 01/20/23 02/19/23  Arrien, York Ram, MD  spironolactone (ALDACTONE) 25 MG tablet TAKE 1 TABLET(25 MG) BY MOUTH DAILY 05/19/22  [name], MD  thyroid (ARMOUR) 120 MG tablet Take 120 mg by mouth daily before breakfast.    [provider]  traZODone (DESYREL) 50 MG tablet Take 25 mg by mouth at bedtime.    [provider]  triamcinolone (NASACORT) 55 MCG/ACT AERO nasal inhaler Place 2 sprays into the nose daily.     [provider]      Allergies    Paxil [paroxetine], Amiodarone, Chlorhexidine gluconate, Morphine and codeine, Percocet [oxycodone-acetaminophen], Cefdinir, and Zolpidem    Review of Systems   Review of Systems  Constitutional:  Negative for fever.  Respiratory:  Positive for shortness of breath. Negative for cough.   Cardiovascular:  Positive for  palpitations.  All other systems reviewed and are negative.   Physical Exam Updated Vital Signs BP 100/80   Pulse (!) 111   Temp 98.2 F (36.8 C) (Oral)   Resp 14   Ht 1.753 m (5\' 9" )   Wt 69.9 kg   SpO2 100%   BMI 22.74 kg/m  Physical Exam Vitals and nursing note reviewed.  Constitutional:      Appearance: She is well-developed. She is not ill-appearing.  HENT:     Head: Normocephalic and atraumatic.  Eyes:     Pupils: Pupils are equal, round, and reactive to light.  Cardiovascular:     Rate and Rhythm: Tachycardia present. Rhythm irregular.     Heart sounds: Normal heart sounds.  Pulmonary:     Effort: Pulmonary effort is normal. No respiratory distress.     Breath sounds: No wheezing.  Abdominal:     General: Bowel sounds are normal.     Palpations: Abdomen is soft.  Musculoskeletal:     Cervical back: Neck supple.  Skin:    General: Skin is warm and dry.  Neurological:     Mental Status: She is alert and oriented to person, place, and time.  Psychiatric:        Mood and Affect: Mood normal.     ED Results / Procedures / Treatments   Labs (all labs ordered are listed, but only abnormal results are displayed) Labs Reviewed  BASIC METABOLIC PANEL - Abnormal; Notable for the following components:      Result Value   CO2 20 (*)    Glucose, Bld 127 (*)    BUN 23 (*)    Creatinine, Ser 1.40 (*)    Calcium 8.8 (*)    GFR, Estimated 44 (*)    All other components within normal limits  CBC - Abnormal; Notable for the following components:   Hemoglobin 11.8 (*)    All other components within normal limits  TROPONIN I (HIGH SENSITIVITY)  TROPONIN I (HIGH SENSITIVITY)    EKG EKG Interpretation Date/Time:  Friday March 27 2023 05:12:19 EST Ventricular Rate:  116 PR Interval:    QRS Duration:  178 QT Interval:  399 QTC Calculation: 555 R Axis:   -81  Text Interpretation: Atrial flutter with predominant 2:1 AV block RBBB and LAFB LVH with secondary  repolarization abnormality Confirmed by Ross Marcus ([id number]) on 03/27/2023 5:25:19 AM  Radiology DG Chest 1 View Result Date: 03/26/2023 CLINICAL DATA:  Shortness of breath. EXAM: CHEST  1 VIEW COMPARISON:  03/21/2023. FINDINGS: The heart is enlarged and mediastinal contours are within normal limits. No consolidation, effusion, or pneumothorax. A stable ICD is present over the left chest. No acute osseous abnormality is seen. IMPRESSION: No active disease. Electronically Signed   By: Thornell Sartorius  M.D.   On: 03/26/2023 20:03    Procedures Procedures    Medications Ordered in ED Medications - No data to display  ED Course/ Medical Decision Making/ A&P Clinical Course as of 03/27/23 0527  Fri Mar 27, 2023  0525 Spoke with cardiology fellow.  He will assess patient and have morning EP team evaluate. [CH]    Clinical Course User Index [CH] Josephene Marrone, Mayer Masker, MD                                 Medical Decision Making Risk Decision regarding hospitalization.   This patient presents to the ED for concern of symptomatic atrial fibrillation, this involves an extensive number of treatment options, and is a complaint that carries with it a high risk of complications and morbidity.  I considered the following differential and admission for this acute, potentially life threatening condition.  The differential diagnosis includes symptomatic A-fib, heart failure  MDM:    This is a 58 year old female who presents with concern for symptomatic atrial fibrillation.  Symptoms are consistent with symptomatic A-fib.  She does not appear volume overloaded.  Not having active chest pain.  EKG shows her bundle branch block and atrial fibs/flutter.  No acute ischemic changes.  Troponin x 1 is 7.  No significant metabolic derangement.  Discussed with cardiology fellow.  She will be evaluated by the cardiologist and the day team for admission.  (Labs, imaging, consults)  Labs: I Ordered, and personally  interpreted labs.  The pertinent results include: CBC, BMP, troponin  Imaging Studies ordered: I ordered imaging studies including chest x-ray I independently visualized and interpreted imaging. I agree with the radiologist interpretation  Additional history obtained from prior evaluations.  External records from outside source obtained and reviewed including cardiology notes  Cardiac Monitoring: The patient was maintained on a cardiac monitor.  If on the cardiac monitor, I personally viewed and interpreted the cardiac monitored which showed an underlying rhythm of: Atrial fibrillation  Reevaluation: After the interventions noted above, I reevaluated the patient and found that they have :stayed the same  Social Determinants of Health:  lives independently  Disposition: Admit  Co morbidities that complicate the patient evaluation  Past Medical History:  Diagnosis Date   AICD (automatic cardioverter/defibrillator) present    St. Jude   Anemia    CAD in native artery    a. late recognition of presentation of STEMI 08/2015 s/p DES to LAD, mild residual mRCA.   Chronic systolic CHF (congestive heart failure) (HCC)    CKD (chronic kidney disease), stage III (HCC)    Depression    Hypertension    Hypotension    a. preventing med titration for HF.   Hypothyroidism 09/24/2015   Ischemic cardiomyopathy    Myocardial infarction [facility]) 08/2015   PAF (paroxysmal atrial fibrillation) (HCC) 09/24/2015   a. dx at time of STEMI 08/2015.   Pre-diabetes    Presence of permanent cardiac pacemaker    patient has ICD   PVC's (premature ventricular contractions)      Medicines No orders of the defined types were placed in this encounter.   I have reviewed the patients home medicines and have made adjustments as needed  Problem List / ED Course: Problem List Items Addressed This Visit   None Visit Diagnoses       Atrial flutter, unspecified type (HCC)    -  Primary  Final Clinical Impression(s) / ED Diagnoses Final diagnoses:  Atrial flutter, unspecified type [facility])    Rx / DC Orders ED Discharge Orders     None         Wilkie Aye, Mayer Masker, MD 03/27/23 [phone]

## 2023-03-27 NOTE — Progress Notes (Signed)
  Echocardiogram Echocardiogram Transesophageal has been performed.  Catherine Mays 03/27/2023, 12:03 PM

## 2023-03-27 NOTE — Consult Note (Addendum)
ELECTROPHYSIOLOGY CONSULT NOTE    Patient ID: Catherine Mays MRN: [id number], DOB/AGE: November 16, 1965 58 y.o.  Admit date: 03/26/2023 Date of Consult: 03/27/2023  Primary Physician: Benita Stabile, MD Primary Cardiologist: Verne Carrow, MD  Electrophysiologist: Dr. Lalla Brothers   Referring Provider: Dr. Derrell Lolling  Patient Profile: Catherine Mays is a 58 y.o. female with a history of prior CAD s/p STEMI 08/2015, HFrEF s/p ICD with malfunction of ICD lead s/p revision 03/2022, atrial fibrillation, mitral regurgitation, HLD, CKD III, PVC's GIB who is being seen today for the evaluation of AF at the request of Dr. Derrell Lolling.  HPI:  Catherine Mays is a 58 y.o. female with a complex EP history who presented to [facility] ER on 1/30 with reports of nausea, feeling poorly in AF.    She was admitted in 12/2022 with decompensated CHF in the setting of AFwRVR.  Sotalol was discontinued and she was transitioned to dofetilide.  She had persistent symptoms of chest pain, palpitations, shortness of breath, & a squeezing sensation in her neck.  She was seen in the ER and subsequently cardioverted.  She followed up in the AF Clinic 03/03/23 and was feeling better at that time.  She was planned for ablation in March.  Previously intolerant of amiodarone due to GI side effects.  She was seen again in ER on 03/09/23 for AF but converted without intervention.  No changes in her medications at that time > remained on metoprolol & tikosyn.  She ws seen again in ER on 03/21/23 for palpitations and was back in AF.  She was cardioverted and returned to NSR and was discharge.  Unfortunately, she went back into AF on 03/25/23.  She spoke with the AF Clinic and Dr. Lalla Brothers and was recommended for admission for possible device upgrade and AV nodal ablation.   She reports she has had nausea with activity and fatigue. Some shortness of breath with exertion. Feels an uncomfortable sensation in her throat when in AF.  She took her home Tikosyn in  the ER last night. Reports she did not receive her meds from the ER so she missed a dose of Eliquis. Labs - Na 137, K 4.2, BUN 23 / Cr 1.4, WBC 6.6, Hgb 11.8, platelets 248.   She denies chest pain, PND, orthopnea, vomiting, dizziness, syncope, edema, weight gain, or early satiety.   Labs Potassium4.2 (01/30 1918) Magnesium  2.0 (01/31 0551) Creatinine, ser  1.40* (01/30 1918) PLT  248 (01/30 1918) HGB  11.8* (01/30 1918) WBC 6.6 (01/30 1918) Troponin I (High Sensitivity)7 (01/30 2209).    Past Medical History:  Diagnosis Date   AICD (automatic cardioverter/defibrillator) present    St. Jude   Anemia    CAD in native artery    a. late recognition of presentation of STEMI 08/2015 s/p DES to LAD, mild residual mRCA.   Chronic systolic CHF (congestive heart failure) (HCC)    CKD (chronic kidney disease), stage III (HCC)    Depression    Hypertension    Hypotension    a. preventing med titration for HF.   Hypothyroidism 09/24/2015   Ischemic cardiomyopathy    Myocardial infarction [facility]) 08/2015   PAF (paroxysmal atrial fibrillation) (HCC) 09/24/2015   a. dx at time of STEMI 08/2015.   Pre-diabetes    Presence of permanent cardiac pacemaker    patient has ICD   PVC's (premature ventricular contractions)      Surgical History:  Past Surgical History:  Procedure Laterality Date   BIOPSY  06/16/2019   Procedure: BIOPSY;  Surgeon: Corbin Ade, MD;  [facility];  Service: Endoscopy;;   CARDIAC CATHETERIZATION N/A 09/20/2015   Procedure: Left Heart Cath and Coronary Angiography;  Surgeon: Kathleene Hazel, MD;  Location: [facility] INVASIVE CV LAB;  Service: Cardiovascular;  Laterality: N/A;   CARDIAC CATHETERIZATION N/A 09/20/2015   Procedure: Coronary Stent Intervention;  Surgeon: Kathleene Hazel, MD;  Location: [facility] INVASIVE CV LAB;  Service: Cardiovascular;  Laterality: N/A;   CARDIAC CATHETERIZATION N/A 09/21/2015   Procedure: Left Heart Cath and Coronary  Angiography;  Surgeon: Tonny Bollman, MD;  Location: [facility] INVASIVE CV LAB;  Service: Cardiovascular;  Laterality: N/A;   CARDIAC CATHETERIZATION N/A 11/26/2015   Procedure: Right Heart Cath;  Surgeon: [name], MD;  Location: [facility] INVASIVE CV LAB;  Service: Cardiovascular;  Laterality: N/A;   COLONOSCOPY WITH PROPOFOL N/A 11/23/2017   Dr. Jena Gauss: Diverticulosis, internal hemorrhoids, next colonoscopy in 10 years   COLONOSCOPY WITH PROPOFOL N/A 08/10/2019   Procedure: COLONOSCOPY WITH PROPOFOL;  Surgeon: Corbin Ade, MD;  Diverticulosis in sigmoid colon, nonbleeding internal hemorrhoids, otherwise normal exam.     COLONOSCOPY WITH PROPOFOL N/A 12/02/2020   Procedure: COLONOSCOPY WITH PROPOFOL;  Surgeon: Jeani Hawking, MD;  Location: [facility] ENDOSCOPY;  Service: Endoscopy;  Laterality: N/A;   CORONARY STENT PLACEMENT  09/20/2015   EP IMPLANTABLE DEVICE N/A 01/23/2016   Procedure: ICD Implant;  Surgeon: Will Jorja Loa, MD;  Location: [facility] INVASIVE CV LAB;  Service: Cardiovascular;  Laterality: N/A;   ESOPHAGOGASTRODUODENOSCOPY (EGD) WITH PROPOFOL N/A 06/16/2019   Procedure: ESOPHAGOGASTRODUODENOSCOPY (EGD) WITH PROPOFOL;  Surgeon: Corbin Ade, MD;  Normal esophagus, small hiatal hernia, normal examined stomach, normal examined duodenum.  Gastric biopsy with slight chronic inflammation, duodenal biopsy with slight intramucosal Brunner gland hyperplasia.   ESOPHAGOGASTRODUODENOSCOPY (EGD) WITH PROPOFOL N/A 11/30/2020   Procedure: ESOPHAGOGASTRODUODENOSCOPY (EGD) WITH PROPOFOL;  Surgeon: Jenel Lucks, MD;  Location: [facility] ENDOSCOPY;  Service: Gastroenterology;  Laterality: N/A;   GIVENS CAPSULE STUDY N/A 11/23/2019    Surgeon: Corbin Ade, MD; essentially unremarkable except for tiny erosion in the proximal small bowel.   GIVENS CAPSULE STUDY  11/30/2020   Procedure: GIVENS CAPSULE STUDY;  Surgeon: Jenel Lucks, MD;  Location: [facility] ENDOSCOPY;  Service: Gastroenterology;;   ICD GENERATOR  CHANGEOUT N/A 04/14/2022   Procedure: ICD GENERATOR CHANGEOUT;  Surgeon: Lanier Prude, MD;  Location: [facility] INVASIVE CV LAB;  Service: Cardiovascular;  Laterality: N/A;   LEAD EXTRACTION N/A 04/14/2022   Procedure: LEAD EXTRACTION;  Surgeon: Lanier Prude, MD;  Location: [facility] INVASIVE CV LAB;  Service: Cardiovascular;  Laterality: N/A;   LEFT HEART CATH AND CORONARY ANGIOGRAPHY N/A 12/18/2017   Procedure: LEFT HEART CATH AND CORONARY ANGIOGRAPHY;  Surgeon: [name], MD;  Location: [facility] INVASIVE CV LAB;  Service: Cardiovascular;  Laterality: N/A;   PVC ABLATION N/A 04/02/2017   Procedure: PVC ABLATION;  Surgeon: Regan Lemming, MD;  Location: [facility] INVASIVE CV LAB;  Service: Cardiovascular;  Laterality: N/A;   TEE WITHOUT CARDIOVERSION N/A 10/08/2016   Procedure: TRANSESOPHAGEAL ECHOCARDIOGRAM (TEE);  Surgeon: [name], MD;  Location: [facility] ENDOSCOPY;  Service: Cardiovascular;  Laterality: N/A;   THYROIDECTOMY       (Not in a hospital admission)   Inpatient Medications:   dofetilide  250 mcg Oral BID   ferrous sulfate  325 mg Oral Daily   metoprolol succinate  25 mg Oral QHS   rosuvastatin  20 mg Oral Daily  sertraline  25 mg Oral Daily   spironolactone  25 mg Oral Daily   thyroid  120 mg Oral QAC breakfast   traZODone  25 mg Oral QHS    Allergies:  Allergies  Allergen Reactions   Paxil [Paroxetine] Palpitations   Amiodarone Nausea And Vomiting   Chlorhexidine Gluconate Itching   Morphine And Codeine Nausea And Vomiting   Percocet [Oxycodone-Acetaminophen] Nausea And Vomiting   Cefdinir Nausea Only   Zolpidem Other (See Comments)    Doesn't work for patient at all    Family History  Problem Relation Age of Onset   Arrhythmia Mother    Lung cancer Mother        bronchiectasis   Heart failure Father    Heart attack Father    Heart attack Brother    Arrhythmia Brother    Colon cancer Neg Hx    Colon polyps Neg Hx      Physical Exam: Vitals:    03/27/23 0455 03/27/23 0457 03/27/23 0500 03/27/23 0739  BP: 106/73  100/80 105/82  Pulse:  (!) 106 (!) 111 (!) 134  Resp: (!) 21 18 14 14   Temp:    97.8 F (36.6 C)  TempSrc:    Oral  SpO2:  100% 100% 98%  Weight:      Height:        GEN- NAD, A&O x 3, normal affect HEENT: Normocephalic, atraumatic Lungs- CTAB, Normal effort.  Heart- Regular rate and rhythm, No M/G/R.  GI- Soft, NT, ND.  Extremities- No clubbing, cyanosis, or edema   Radiology/Studies: DG Chest 1 View Result Date: 03/26/2023 CLINICAL DATA:  Shortness of breath. EXAM: CHEST  1 VIEW COMPARISON:  03/21/2023. FINDINGS: The heart is enlarged and mediastinal contours are within normal limits. No consolidation, effusion, or pneumothorax. A stable ICD is present over the left chest. No acute osseous abnormality is seen. IMPRESSION: No active disease. Electronically Signed   By: Thornell Sartorius M.D.   On: 03/26/2023 20:03   DG Chest 1 View Result Date: 03/21/2023 CLINICAL DATA:  History atrial fibrillation. EXAM: CHEST  1 VIEW COMPARISON:  Chest radiograph dated March 09, 2023. FINDINGS: Stable cardiomediastinal silhouette. Stable left subclavian ICD. No focal consolidation, pleural effusion, or pneumothorax. No acute osseous abnormality. IMPRESSION: No acute cardiopulmonary findings. Electronically Signed   By: Hart Robinsons M.D.   On: 03/21/2023 14:05   DG Chest Port 1 View Result Date: 03/09/2023 CLINICAL DATA:  Atrial fibrillation EXAM: PORTABLE CHEST 1 VIEW COMPARISON:  02/11/2023 FINDINGS: Lungs are clear.  No pleural effusion or pneumothorax. The heart is top-normal in size.  Left subclavian ICD. IMPRESSION: No acute cardiopulmonary disease. Electronically Signed   By: Charline Bills M.D.   On: 03/09/2023 21:45    EKG: (personally reviewed) EKG 03/26/23 > AF with wide QRS at 160 ms, 117 bpm  TELEMETRY: AF 100-140's (personally reviewed)  DEVICE HISTORY: Abbott single chamber ICD, implanted 04/14/22 for ICM /  sCHF  Assessment/Plan:  Persistent Atrial Fibrillation with RVR  Recurrent AF after recent DCCV.  Feels poorly in AF.  Severely reduced LV function. Worsening conduction over time as seen by widening of QRS, now with ventricular dyssynchrony.   -plan for TEE with DCCV 1/31 per Dr. Mayford Knife  -NPO  -continue Tikosyn BID (no missed doses) -unfortunately missed one dose of Eliquis last night due to not receiving meds in the ER (she is very compliant with her medications) -continue metoprolol 25 mg daily  -hold home eliquis and begin  heparin infusion in anticipation of BiV / CRT-D upgrade on 2/4 and AV node ablation (planned for 2/4 at 3pm with Dr. Lalla Brothers) -NPO after MN on 2/4  PVC's  -continue BB  HFrEF  S/p ICD with lead revision  -euvolemic on exam  -continue spironolactone  -continue beta blocker  as above -appreciate Advanced HF Team assistance with patient care   CKD  -Trend BMP / urinary output -Replace electrolytes as indicated -Avoid nephrotoxic agents, ensure adequate renal perfusion    For questions or updates, please contact [facility] Please consult [email or web address] for contact info under Cardiology/STEMI.   Informed Consent   Shared Decision Making/Informed Consent   The risks [stroke, cardiac arrhythmias rarely resulting in the need for a temporary or permanent pacemaker, skin irritation or burns, esophageal damage, perforation (1:10,000 risk), bleeding, pharyngeal hematoma as well as other potential complications associated with conscious sedation including aspiration, arrhythmia, respiratory failure and death], benefits (treatment guidance, restoration of normal sinus rhythm, diagnostic support) and alternatives of a transesophageal echocardiogram guided cardioversion were discussed in detail with Ms. Puebla and she is willing to proceed.      Signed, Canary Brim, NP-C, AGACNP-BC [facility] - Electrophysiology  03/27/2023, 10:26  AM

## 2023-03-27 NOTE — TOC Initial Note (Signed)
Transition of Care [facility]) - Initial/Assessment Note    Patient Details  Name: Catherine Mays MRN: [id number] Date of Birth: 10-18-1965  Transition of Care [facility]) CM/SW Contact:    Elliot Cousin, RN Phone Number: [phone] 03/27/2023, 4:44 PM  Clinical Narrative:                  HF TOC CM spoke to pt's husband. Pt was independent pta. Husband at home to assist with care. Pt has scale at home for daily weights.  Will continue to follow for dc needs.    Expected Discharge Plan: Home/Self Care Barriers to Discharge: Continued Medical Work up   Patient Goals and CMS Choice            Expected Discharge Plan and Services   Discharge Planning Services: CM Consult   Living arrangements for the past 2 months: Single Family Home                                      Prior Living Arrangements/Services Living arrangements for the past 2 months: Single Family Home Lives with:: Spouse Patient language and need for interpreter reviewed:: Yes Do you feel safe going back to the place where you live?: Yes      Need for Family Participation in Patient Care: No (Comment) Care giver support system in place?: Yes (comment) Current home services: DME Criminal Activity/Legal Involvement Pertinent to Current Situation/Hospitalization: No - Comment as needed  Activities of Daily Living   ADL Screening (condition at time of admission) Independently performs ADLs?: Yes (appropriate for developmental age) Is the patient deaf or have difficulty hearing?: No Does the patient have difficulty seeing, even when wearing glasses/contacts?: No Does the patient have difficulty concentrating, remembering, or making decisions?: No  Permission Sought/Granted Permission sought to share information with : Case Manager, Family Supports, PCP    Share Information with NAME: Catherine Mays     Permission granted to share info w Relationship: husband  Permission granted to share info w  Contact Information: [phone]  Emotional Assessment           Psych Involvement: No (comment)  Admission diagnosis:  Atrial fibrillation (HCC) [I48.91] Paroxysmal atrial fibrillation (HCC) [I48.0] Atrial flutter, unspecified type [facility]) [I48.92] Patient Active Problem List   Diagnosis Date Noted   Atrial fibrillation with RVR (HCC) 03/27/2023   Persistent atrial fibrillation (HCC) 02/11/2023   Paroxysmal atrial fibrillation (HCC) 02/03/2023   Hypercoagulable state due to paroxysmal atrial fibrillation (HCC) 01/21/2023   CKD stage 3a, GFR 45-59 ml/min (HCC) 01/19/2023   Depression 01/18/2023   Acute exacerbation of CHF (congestive heart failure) (HCC) 01/17/2023   ICD (implantable cardioverter-defibrillator) in place 04/14/2022   Acute upper GI bleed 11/29/2020   Acute on chronic blood loss anemia 11/29/2020   GI bleed 11/29/2020   IDA (iron deficiency anemia) 05/23/2019   GERD (gastroesophageal reflux disease) 05/23/2019   Nausea without vomiting 05/23/2019   Weakness 12/18/2017   Sensation of chest pressure 12/17/2017   Encounter for screening colonoscopy 10/19/2017   PVC (premature ventricular contraction) 04/02/2017   Mitral regurgitation 04/17/2016   CHF (congestive heart failure) (HCC) 01/23/2016   PVC's (premature ventricular contractions)    Pre-diabetes    Chronic systolic CHF (congestive heart failure) (HCC)    Ischemic cardiomyopathy    Hypotension    CAD in native artery    Hypothyroidism 09/24/2015  PAF (paroxysmal atrial fibrillation) (HCC) 09/24/2015   PCP:  Benita Stabile, MD Pharmacy:   [facility] [phone] [place] - [address] AT [facility] [address] [place] [address] Phone: [phone] Fax: [phone]  [facility] [address] [place] [address] Phone: [phone] Fax: [phone]     Social Drivers of Health (SDOH) Social History: SDOH Screenings   Food  Insecurity: No Food Insecurity (03/27/2023)  Housing: Low Risk  (03/27/2023)  Recent Concern: Housing - High Risk (01/17/2023)  Transportation Needs: No Transportation Needs (03/27/2023)  Utilities: Not At Risk (03/27/2023)  Alcohol Screen: Low Risk  (02/10/2020)  Depression (PHQ2-9): Low Risk  (02/10/2020)  Financial Resource Strain: Low Risk  (02/10/2020)  Physical Activity: Insufficiently Active (02/10/2020)  Social Connections: Socially Integrated (03/27/2023)  Stress: No Stress Concern Present (02/10/2020)  Tobacco Use: Low Risk  (03/27/2023)   SDOH Interventions:     Readmission Risk Interventions     No data to display

## 2023-03-27 NOTE — CV Procedure (Addendum)
     PROCEDURE NOTE:  Procedure:  Transesophageal echocardiogram Operator:  Armanda Magic, MD Indications:  atrial fibrillation with RVR Complications: None  During this procedure the patient is administered a total of Ketamine 30 mg, Lidocaine 40mg , Propofol 64mg  to achieve and maintain moderate conscious sedation.  The patient's heart rate, blood pressure, and oxygen saturation are monitored continuously during the procedure. The period of conscious sedation is 9 minutes, of which I was present face-to-face 100% of this time. Ardean Larsen, CRNA is an independent, trained observer who assisted in the monitoring of the patient's level of consciousness.    Results: Severely dilated LV with severe LV dysfunction EF 25%  Normal RV size and function Mildly RA Moderately dilated LA and LA appendage with no evidence of thrombus.  There is significant spontaneous echo contrast in the body of the LA and LA appendage. Emptying velocity low at 25cm/s Normal TV with moderate TR Normal PV Normal MV with moderate MR related to LA and LV dilatation and malcoaptation of MV leaflets Normal trileaflet AV with trivial AR Normal interatrial septum with no evidence of shunt by colorflow doppler  There is an AICD lead present in the RA and RV.  There is a mobile density on the lead in the RA that likely represents thrombus.  If concern for endocarditis, recommend blood cx and inflammatory markers.  Aorta and 2 chamber view not obtained due to patient agitation and distress from significant respiratory secretions requiring probe removal  At the end of the TEE the patient spontaneously converted to NSR and Cardioversion cancelled.  The patient was transferred back to her room in stable condition.  Senai Kingsley 03/27/2023, 10:36 AM

## 2023-03-27 NOTE — ED Notes (Signed)
 Pt transported to cath lab.

## 2023-03-27 NOTE — Transfer of Care (Signed)
Immediate Anesthesia Transfer of Care Note  Patient: Catherine Mays  Procedure(s) Performed: TRANSESOPHAGEAL ECHOCARDIOGRAM CARDIOVERSION  Patient Location: PACU  Anesthesia Type:MAC  Level of Consciousness: awake and alert   Airway & Oxygen Therapy: Patient Spontanous Breathing  Post-op Assessment: Report given to RN  Post vital signs: Reviewed and stable  Last Vitals:  Vitals Value Taken Time  BP 101/62 03/27/23 1202  Temp 36.6 C 03/27/23 1202  Pulse 74 03/27/23 1206  Resp 19 03/27/23 1206  SpO2 96 % 03/27/23 1206  Vitals shown include unfiled device data.  Last Pain:  Vitals:   03/27/23 1202  TempSrc: Temporal  PainSc: 0-No pain         Complications: No notable events documented.

## 2023-03-27 NOTE — Progress Notes (Signed)
PHARMACY - ANTICOAGULATION CONSULT NOTE  Pharmacy Consult for heparin Indication: atrial fibrillation  Allergies  Allergen Reactions   Paxil [Paroxetine] Palpitations   Amiodarone Nausea And Vomiting   Chlorhexidine Gluconate Itching   Morphine And Codeine Nausea And Vomiting   Percocet [Oxycodone-Acetaminophen] Nausea And Vomiting   Cefdinir Nausea Only   Zolpidem Other (See Comments)    Doesn't work for patient at all    Patient Measurements: Height: 5\' 9"  (175.3 cm) Weight: 69.9 kg (154 lb) IBW/kg (Calculated) : 66.2 Heparin Dosing Weight: 69 Kg  Vital Signs: Temp: 97.8 F (36.6 C) (01/31 0739) Temp Source: Oral (01/31 0739) BP: 105/82 (01/31 0739) Pulse Rate: 134 (01/31 0739)  Labs: Recent Labs    03/26/23 1918 03/26/23 2209  HGB 11.8*  --   HCT 37.5  --   PLT 248  --   CREATININE 1.40*  --   TROPONINIHS 7 7    Estimated Creatinine Clearance: 46.3 mL/min (A) (by C-G formula based on SCr of 1.4 mg/dL (H)).   Medical History: Past Medical History:  Diagnosis Date   AICD (automatic cardioverter/defibrillator) present    [employer]   Anemia    CAD in native artery    a. late recognition of presentation of STEMI 08/2015 s/p DES to LAD, mild residual mRCA.   Chronic systolic CHF (congestive heart failure) (HCC)    CKD (chronic kidney disease), stage III (HCC)    Depression    Hypertension    Hypotension    a. preventing med titration for HF.   Hypothyroidism 09/24/2015   Ischemic cardiomyopathy    Myocardial infarction [facility]) 08/2015   PAF (paroxysmal atrial fibrillation) (HCC) 09/24/2015   a. dx at time of STEMI 08/2015.   Pre-diabetes    Presence of permanent cardiac pacemaker    patient has ICD   PVC's (premature ventricular contractions)      Assessment: 89 yoF presenting to the ED with atrial fibrillation. Patient reports taking Eliquis prior to admission (LD 1/30 PM) CBC stable. Pharmacy consulted to dose heparin infusion.    Goal of Therapy:   Heparin level 0.3-0.7 units/ml aPTT 66-102 Monitor platelets by anticoagulation protocol: Yes   Plan:  Start heparin infusion at 1000 units/hr Check aPTT/ anti-Xa level in 8 hours and daily while on heparin Continue to monitor H&H and platelets  Ruben Im, PharmD Clinical Pharmacist 03/27/2023 7:49 AM Please check AMION for all [facility] Pharmacy numbers

## 2023-03-27 NOTE — H&P (Signed)
Cardiology Admission History and Physical   Patient ID: Catherine Mays MRN: [id number]; DOB: 06/30/1965   Admission date: 03/26/2023  PCP:  Benita Stabile, MD   [facility] Providers Cardiologist:  Verne Carrow, MD  Electrophysiologist:  Lanier Prude, MD       Chief Complaint:  palpitations  Patient Profile:   Catherine Mays is a 58 y.o. female with history of persistent atrial fibrillation on Eliquis, CAD (s/p STEMI in 08/2015), chronic HFrEF s/p St. Jude ICD in place (recent malfunctioning of RV ICD lead and replacement along with ICD generator replacement in 03/2022), history of GIB, mitral regurgitation, HLD, Stage 3 CKD and PVC's (prior PVC ablation in 03/2017),  who is being seen 03/27/2023 for the evaluation of atrial fibrillation.    History of Present Illness:   Catherine Mays has had a difficult course of symptomatic paroxysmal A-fib.  Recently.  Was admitted back in 01/16/2023 with acute heart failure and A-fib with RVR.  Sotalol was discontinued at that time she was transition to dofetilide.  She had persistent symptoms in December tachypalpitations, chest pain, shortness of breath, and squeezing sensation in her neck.  Episodes of RVR to the 150s so she was cardioverted in the ED at that time.  Was seen in A-fib clinic on 1/7 was feeling better.  Plan for A-fib ablation in March.  She has been on Catherine Mays in the past but could not tolerate due to GI side effects.  She also takes metoprolol for rate control.   She was most recently seen in the ER on 03/09/2023 for palpitations in the setting of paroxysmal atrial fibrillation.  She was seen by Cardiology, but self converted to sinus rhythm prior to any intervention performed.  She is instructed to take her Tikosyn and metoprolol as bribed with a plan to pursue catheter ablation in March. She presented again in the ER on 1/25 for palpitations and was found to be back in atrial fibrillation.  She underwent  synchronized cardioversion with restoration back to sinus rhythm.  However, she states she returned back to atrial fibrillation this past Wednesday.  Catherine Mays spoke with the APP Clint Fenton stating that she has been feeling poorly in atrial fibrillation.  The case was discussed with Dr. Lalla Brothers, recommended inpatient admission for possible AV nodal ablation with device upgrade.  In the ED, vitals include heart rate 118, blood pressure 98/84, SpO2 99% on room air.  Notable labs include potassium 4.2, serum creatinine 1.4, white blood cell count 6.6, hemoglobin 11.8, platelet count 2 48K.    Past Medical History:  Diagnosis Date   AICD (automatic cardioverter/defibrillator) present    St. Jude   Anemia    CAD in native artery    a. late recognition of presentation of STEMI 08/2015 s/p DES to LAD, mild residual mRCA.   Chronic systolic CHF (congestive heart failure) (HCC)    CKD (chronic kidney disease), stage III (HCC)    Depression    Hypertension    Hypotension    a. preventing med titration for HF.   Hypothyroidism 09/24/2015   Ischemic cardiomyopathy    Myocardial infarction [facility]) 08/2015   PAF (paroxysmal atrial fibrillation) (HCC) 09/24/2015   a. dx at time of STEMI 08/2015.   Pre-diabetes    Presence of permanent cardiac pacemaker    patient has ICD   PVC's (premature ventricular contractions)     Past Surgical History:  Procedure Laterality Date   BIOPSY  06/16/2019   Procedure: BIOPSY;  Surgeon: Corbin Ade, MD;  Location: [facility] ENDO [facility];  Service: Endoscopy;;   CARDIAC CATHETERIZATION N/A 09/20/2015   Procedure: Left Heart Cath and Coronary Angiography;  Surgeon: Kathleene Hazel, MD;  Location: [facility] INVASIVE CV LAB;  Service: Cardiovascular;  Laterality: N/A;   CARDIAC CATHETERIZATION N/A 09/20/2015   Procedure: Coronary Stent Intervention;  Surgeon: Kathleene Hazel, MD;  Location: [facility] INVASIVE CV LAB;  Service: Cardiovascular;  Laterality: N/A;   CARDIAC  CATHETERIZATION N/A 09/21/2015   Procedure: Left Heart Cath and Coronary Angiography;  Surgeon: Tonny Bollman, MD;  Location: [facility] INVASIVE CV LAB;  Service: Cardiovascular;  Laterality: N/A;   CARDIAC CATHETERIZATION N/A 11/26/2015   Procedure: Right Heart Cath;  Surgeon: [name], MD;  Location: [facility] INVASIVE CV LAB;  Service: Cardiovascular;  Laterality: N/A;   COLONOSCOPY WITH PROPOFOL N/A 11/23/2017   Dr. Jena Gauss: Diverticulosis, internal hemorrhoids, next colonoscopy in 10 years   COLONOSCOPY WITH PROPOFOL N/A 08/10/2019   Procedure: COLONOSCOPY WITH PROPOFOL;  Surgeon: Corbin Ade, MD;  Diverticulosis in sigmoid colon, nonbleeding internal hemorrhoids, otherwise normal exam.     COLONOSCOPY WITH PROPOFOL N/A 12/02/2020   Procedure: COLONOSCOPY WITH PROPOFOL;  Surgeon: Jeani Hawking, MD;  Location: [facility] ENDOSCOPY;  Service: Endoscopy;  Laterality: N/A;   CORONARY STENT PLACEMENT  09/20/2015   EP IMPLANTABLE DEVICE N/A 01/23/2016   Procedure: ICD Implant;  Surgeon: Will Jorja Loa, MD;  Location: [facility] INVASIVE CV LAB;  Service: Cardiovascular;  Laterality: N/A;   ESOPHAGOGASTRODUODENOSCOPY (EGD) WITH PROPOFOL N/A 06/16/2019   Procedure: ESOPHAGOGASTRODUODENOSCOPY (EGD) WITH PROPOFOL;  Surgeon: Corbin Ade, MD;  Normal esophagus, small hiatal hernia, normal examined stomach, normal examined duodenum.  Gastric biopsy with slight chronic inflammation, duodenal biopsy with slight intramucosal Brunner gland hyperplasia.   ESOPHAGOGASTRODUODENOSCOPY (EGD) WITH PROPOFOL N/A 11/30/2020   Procedure: ESOPHAGOGASTRODUODENOSCOPY (EGD) WITH PROPOFOL;  Surgeon: Jenel Lucks, MD;  Location: [facility] ENDOSCOPY;  Service: Gastroenterology;  Laterality: N/A;   GIVENS CAPSULE STUDY N/A 11/23/2019    Surgeon: Corbin Ade, MD; essentially unremarkable except for tiny erosion in the proximal small bowel.   GIVENS CAPSULE STUDY  11/30/2020   Procedure: GIVENS CAPSULE STUDY;  Surgeon: Jenel Lucks, MD;   Location: [facility] ENDOSCOPY;  Service: Gastroenterology;;   ICD GENERATOR CHANGEOUT N/A 04/14/2022   Procedure: ICD GENERATOR CHANGEOUT;  Surgeon: Lanier Prude, MD;  Location: [facility] INVASIVE CV LAB;  Service: Cardiovascular;  Laterality: N/A;   LEAD EXTRACTION N/A 04/14/2022   Procedure: LEAD EXTRACTION;  Surgeon: Lanier Prude, MD;  Location: [facility] INVASIVE CV LAB;  Service: Cardiovascular;  Laterality: N/A;   LEFT HEART CATH AND CORONARY ANGIOGRAPHY N/A 12/18/2017   Procedure: LEFT HEART CATH AND CORONARY ANGIOGRAPHY;  Surgeon: [name], MD;  Location: [facility] INVASIVE CV LAB;  Service: Cardiovascular;  Laterality: N/A;   PVC ABLATION N/A 04/02/2017   Procedure: PVC ABLATION;  Surgeon: Regan Lemming, MD;  Location: [facility] INVASIVE CV LAB;  Service: Cardiovascular;  Laterality: N/A;   TEE WITHOUT CARDIOVERSION N/A 10/08/2016   Procedure: TRANSESOPHAGEAL ECHOCARDIOGRAM (TEE);  Surgeon: [name], MD;  Location: [facility] ENDOSCOPY;  Service: Cardiovascular;  Laterality: N/A;   THYROIDECTOMY       Medications Prior to Admission: Prior to Admission medications   Medication Sig Start Date End Date Taking? Authorizing Provider  acetaminophen (TYLENOL) 500 MG tablet Take 1,000 mg by mouth every 6 (six) hours as needed for mild pain.    [provider]  buPROPion (WELLBUTRIN XL)  300 MG 24 hr tablet Take 300 mg by mouth daily. 11/07/20   [provider]  Calcium Carb-Cholecalciferol (CALCIUM + D3 PO) Take 1 tablet by mouth every morning.    [provider]  Coenzyme Q10 (COQ-10) 200 MG CAPS Take 200 mg by mouth daily.    [provider]  dexlansoprazole (DEXILANT) 60 MG capsule TAKE 1 CAPSULE(60 MG) BY MOUTH DAILY 05/09/22   [name], MD  dofetilide (TIKOSYN) 250 MCG capsule Take 1 capsule (250 mcg total) by mouth 2 (two) times daily. 02/06/23   Sheilah Pigeon, PA-C  ELIQUIS 5 MG TABS tablet TAKE 1 TABLET(5 MG) BY MOUTH TWICE DAILY 02/20/23   [name], MD  ferrous sulfate 325 (65 FE) MG EC tablet Take 325 mg by mouth daily.    [provider]  fexofenadine (ALLEGRA) 180 MG tablet Take 180 mg by mouth daily.    [provider]  furosemide (LASIX) 20 MG tablet Take 2 tablets as needed in case of weight gain 2 to 3 lbs in 24 hrs or 5 lbs in 7 days until weight back to baseline. Patient taking differently: Take 40 mg by mouth daily. 01/19/23   Arrien, York Ram, MD  metoprolol succinate (TOPROL-XL) 25 MG 24 hr tablet Take 1 tablet (25 mg total) by mouth at bedtime. 02/06/23   Sheilah Pigeon, PA-C  metoprolol tartrate (LOPRESSOR) 25 MG tablet Take 0.5-1 tablet by mouth twice a day as needed for HRs over 100 03/10/23   Eustace Pen, PA-C  nitroGLYCERIN (NITROSTAT) 0.4 MG SL tablet PLACE 1 TABLET UNDER THE TONGUE EVERY 5 MINUTES AS NEEDED FOR CHEST PAIN 04/13/20   [name], MD  rosuvastatin (CRESTOR) 20 MG tablet Take 1 tablet (20 mg total) by mouth daily. 08/27/22   [name], MD  sertraline (ZOLOFT) 25 MG tablet Take 1 tablet (25 mg total) by mouth daily. 01/20/23 02/19/23  Arrien, York Ram, MD  spironolactone (ALDACTONE) 25 MG tablet TAKE 1 TABLET(25 MG) BY MOUTH DAILY 05/19/22   [name], MD  thyroid (ARMOUR) 120 MG tablet Take 120 mg by mouth daily before breakfast.    [provider]  traZODone (DESYREL) 50 MG tablet Take 25 mg by mouth at bedtime.    [provider]  triamcinolone (NASACORT) 55 MCG/ACT AERO nasal inhaler Place 2 sprays into the nose daily.     [provider]     Allergies:    Allergies  Allergen Reactions   Paxil [Paroxetine] Palpitations   Catherine Mays Nausea And Vomiting   Chlorhexidine Gluconate Itching   Morphine And Codeine Nausea And Vomiting   Percocet [Oxycodone-Acetaminophen] Nausea And Vomiting   Cefdinir Nausea Only   Zolpidem Other (See Comments)    Doesn't work for patient at all    Social History:   Social History    Socioeconomic History   Marital status: Married    Spouse name: Not on file   Number of children: Not on file   Years of education: Not on file   Highest education level: Not on file  Occupational History   Not on file  Tobacco Use   Smoking status: Never   Smokeless tobacco: Never   Tobacco comments:    Never smoked 01/21/23  Vaping Use   Vaping status: Never Used  Substance and Sexual Activity   Alcohol use: No   Drug use: No   Sexual activity: Not on file  Other Topics Concern  Not on file  Social History Narrative   Not on file   Social Drivers of Health   Financial Resource Strain: Low Risk  (02/10/2020)   Overall Financial Resource Strain (CARDIA)    Difficulty of Paying Living Expenses: Not hard at all  Food Insecurity: No Food Insecurity (02/03/2023)   Hunger Vital Sign    Worried About Running Out of Food in the Last Year: Never true    Ran Out of Food in the Last Year: Never true  Transportation Needs: No Transportation Needs (02/03/2023)   PRAPARE - Administrator, Civil Service (Medical): No    Lack of Transportation (Non-Medical): No  Physical Activity: Insufficiently Active (02/10/2020)   Exercise Vital Sign    Days of Exercise per Week: 3 days    Minutes of Exercise per Session: 30 min  Stress: No Stress Concern Present (02/10/2020)   [employer] of Occupational Health - Occupational Stress Questionnaire    Feeling of Stress : Not at all  Social Connections: Socially Integrated (02/10/2020)   Social Connection and Isolation Panel [NHANES]    Frequency of Communication with Friends and Family: More than three times a week    Frequency of Social Gatherings with Friends and Family: More than three times a week    Attends Religious Services: More than 4 times per year    Active Member of [employer] or Organizations: Yes    Attends Engineer, structural: More than 4 times per year    Marital Status: Married  Catering manager  Violence: Not At Risk (02/03/2023)   Humiliation, Afraid, Rape, and Kick questionnaire    Fear of Current or Ex-Partner: No    Emotionally Abused: No    Physically Abused: No    Sexually Abused: No    Family History:   The patient's family history includes Arrhythmia in her brother and mother; Heart attack in her brother and father; Heart failure in her father; Lung cancer in her mother. There is no history of Colon cancer or Colon polyps.    ROS:  Please see the history of present illness.  All other ROS reviewed and negative.     Physical Exam/Data:   Vitals:   03/27/23 0229 03/27/23 0455 03/27/23 0457 03/27/23 0500  BP: 98/84 106/73  100/80  Pulse: (!) 118  (!) 106 (!) 111  Resp: 20 (!) 21 18 14   Temp: 98.2 F (36.8 C)     TempSrc: Oral     SpO2: 99%  100% 100%  Weight:      Height:       No intake or output data in the 24 hours ending 03/27/23 0524    03/26/2023    6:16 PM 03/21/2023    1:05 PM 03/03/2023    8:52 AM  Last 3 Weights  Weight (lbs) 154 lb 154 lb 154 lb  Weight (kg) 69.854 kg 69.854 kg 69.854 kg     Body mass index is 22.74 kg/m.  General:  Well nourished, well developed, in no acute distress HEENT: normal Neck: no JVD Vascular: No carotid bruits; Distal pulses 2+ bilaterally   Cardiac:  irregular rate, rhythm Lungs:  clear to auscultation bilaterally, no wheezing, rhonchi or rales  Abd: soft, nontender, no hepatomegaly  Ext: no edema Musculoskeletal:  No deformities, BUE and BLE strength normal and equal Skin: warm and dry  Neuro:  CNs 2-12 intact, no focal abnormalities noted Psych:  Normal affect    EKG:  The ECG that  was done on 03/27/2023 was personally reviewed and demonstrates atrial fibrillation  Relevant CV Studies:  Echo 11/20/22 demonstrated   1. The anterior, apical, inferoapical and anteroseptal walls are thinned  and akinetic. No LV thrombus by Definity contrast. Left ventricular  ejection fraction, by estimation, is 25 to 30%. The  left ventricle has  severely decreased function. The left ventricle demonstrates regional wall motion abnormalities (see scoring diagram/findings for description). The left ventricular internal cavity size was mildly dilated. Left ventricular diastolic parameters are consistent with Grade I diastolic dysfunction (impaired relaxation).   2. Right ventricular systolic function is mildly reduced. The right  ventricular size is normal. There is normal pulmonary artery systolic  pressure. The estimated right ventricular systolic pressure is 28.8 mmHg.   3. Left atrial size was moderately dilated.   4. The mitral valve is abnormal. Mild mitral valve regurgitation.   5. The aortic valve is tricuspid. Aortic valve regurgitation is not  visualized.   6. The inferior vena cava is normal in size with greater than 50%  respiratory variability, suggesting right atrial pressure of 3 mmHg.   Comparison(s): No significant change from prior study. 05/08/2021: LVEF  25-30%, akinesis inferoseptal/anteroseptal, apical lateral wall and apex.   Laboratory Data:  High Sensitivity Troponin:   Recent Labs  Lab 03/09/23 2103 03/09/23 2313 03/21/23 1628 03/26/23 1918 03/26/23 2209  TROPONINIHS 6 10 7 7 7       Chemistry Recent Labs  Lab 03/21/23 1313 03/21/23 1628 03/26/23 1918  NA 141  --  137  K 4.4  --  4.2  CL 107  --  104  CO2 25  --  20*  GLUCOSE 103*  --  127*  BUN 19  --  23*  CREATININE 1.20*  --  1.40*  CALCIUM 9.0  --  8.8*  MG  --  2.0  --   GFRNONAA 53*  --  44*  ANIONGAP 9  --  13    No results for input(s): "PROT", "ALBUMIN", "AST", "ALT", "ALKPHOS", "BILITOT" in the last 168 hours. Lipids No results for input(s): "CHOL", "TRIG", "HDL", "LABVLDL", "LDLCALC", "CHOLHDL" in the last 168 hours. Hematology Recent Labs  Lab 03/21/23 1313 03/26/23 1918  WBC 6.7 6.6  RBC 4.14 4.09  HGB 11.9* 11.8*  HCT 39.0 37.5  MCV 94.2 91.7  MCH 28.7 28.9  MCHC 30.5 31.5  RDW 14.2 13.9  PLT  224 248   Thyroid No results for input(s): "TSH", "FREET4" in the last 168 hours. BNPNo results for input(s): "BNP", "PROBNP" in the last 168 hours.  DDimer No results for input(s): "DDIMER" in the last 168 hours.   Radiology/Studies:  DG Chest 1 View Result Date: 03/26/2023 CLINICAL DATA:  Shortness of breath. EXAM: CHEST  1 VIEW COMPARISON:  03/21/2023. FINDINGS: The heart is enlarged and mediastinal contours are within normal limits. No consolidation, effusion, or pneumothorax. A stable ICD is present over the left chest. No acute osseous abnormality is seen. IMPRESSION: No active disease. Electronically Signed   By: Thornell Sartorius M.D.   On: 03/26/2023 20:03     Assessment and Plan:   Tanise Russman is a 58 y.o. female with history of persistent atrial fibrillation on Eliquis, CAD (s/p STEMI in 08/2015), chronic HFrEF s/p St. Jude ICD in place (recent malfunctioning of RV ICD lead and replacement along with ICD generator replacement in 03/2022), history of GIB, mitral regurgitation, HLD, Stage 3 CKD and PVC's (prior PVC ablation in 03/2017),  who is being  seen 03/27/2023 for the evaluation of atrial fibrillation.  #persistent atrial fibrillation She presents today in symptomatic atrial fibrillation after a recent cardioversion.  She has severely reduced LV function and has a moderately dilated left atrium.  He has a class I indication for catheter ablation. Catherine Mays has been adherent with apixaban -continue apixaban 5mg  BID -continue tikosyn -continue BB  #HFrEF s/p St. Jude ICD Appears euvolemic on exam -continue spironolactone -continue BB  #PVCs -continue BB  Risk Assessment/Risk Scores:         CHA2DS2-VASc Score = 3   This indicates a 3.2% annual risk of stroke. The patient's score is based upon: CHF History: 1 HTN History: 0 Diabetes History: 0 Stroke History: 0 Vascular Disease History: 1 Age Score: 0 Gender Score: 1     Code Status: Full Code  Severity of  Illness: The appropriate patient status for this patient is INPATIENT. Inpatient status is judged to be reasonable and necessary in order to provide the required intensity of service to ensure the patient's safety. The patient's presenting symptoms, physical exam findings, and initial radiographic and laboratory data in the context of their chronic comorbidities is felt to place them at high risk for further clinical deterioration. Furthermore, it is not anticipated that the patient will be medically stable for discharge from the hospital within 2 midnights of admission.   * I certify that at the point of admission it is my clinical judgment that the patient will require inpatient hospital care spanning beyond 2 midnights from the point of admission due to high intensity of service, high risk for further deterioration and high frequency of surveillance required.*   For questions or updates, please contact [facility] Please consult [email or web address] for contact info under     Signed, Donavan Burnet, MD  03/27/2023 5:24 AM

## 2023-03-28 DIAGNOSIS — I4891 Unspecified atrial fibrillation: Secondary | ICD-10-CM | POA: Diagnosis not present

## 2023-03-28 LAB — HEPARIN LEVEL (UNFRACTIONATED): Heparin Unfractionated: >1.1 [IU]/mL — ABNORMAL HIGH (ref 0.30–0.70)

## 2023-03-28 LAB — CBC
HCT: 33.4 % — ABNORMAL LOW (ref 36.0–46.0)
Hemoglobin: 10.8 g/dL — ABNORMAL LOW (ref 12.0–15.0)
MCH: 29.2 pg (ref 26.0–34.0)
MCHC: 32.3 g/dL (ref 30.0–36.0)
MCV: 90.3 fL (ref 80.0–100.0)
Platelets: 188 10*3/uL (ref 150–400)
RBC: 3.7 MIL/uL — ABNORMAL LOW (ref 3.87–5.11)
RDW: 14.1 % (ref 11.5–15.5)
WBC: 8.6 10*3/uL (ref 4.0–10.5)
nRBC: 0 % (ref 0.0–0.2)

## 2023-03-28 LAB — MAGNESIUM: Magnesium: 1.9 mg/dL (ref 1.7–2.4)

## 2023-03-28 LAB — APTT
aPTT: 127 s — ABNORMAL HIGH (ref 24–36)
aPTT: 105 s — ABNORMAL HIGH (ref 24–36)

## 2023-03-28 NOTE — Progress Notes (Signed)
PHARMACY - ANTICOAGULATION CONSULT NOTE  Pharmacy Consult for heparin Indication: atrial fibrillation  Patient Measurements: Height: 5\' 9"  (175.3 cm) Weight: 69 kg (152 lb 3.2 oz) IBW/kg (Calculated) : 66.2 Heparin Dosing Weight: 69 Kg  Vital Signs: Temp: 97.9 F (36.6 C) (02/01 0414) Temp Source: Oral (02/01 0414) BP: 92/60 (02/01 0414) Pulse Rate: 78 (02/01 0414)  Labs: Recent Labs    03/26/23 1918 03/26/23 2209 03/27/23 1601 03/28/23 0346  HGB 11.8*  --   --  10.8*  HCT 37.5  --   --  33.4*  PLT 248  --   --  188  APTT  --   --  46* 127*  HEPARINUNFRC  --   --  >1.10* >1.10*  CREATININE 1.40*  --   --   --   TROPONINIHS 7 7  --   --     Estimated Creatinine Clearance: 46.3 mL/min (A) (by C-G formula based on SCr of 1.4 mg/dL (H)).   Assessment: 28 yoF presenting to the ED with atrial fibrillation. Patient reports taking Eliquis prior to admission (LD 1/30 PM) CBC stable. Pharmacy consulted to dose heparin infusion.   -heparin level falsely elevated given recent eliquis use and aPTT no supra-therapeutic on 1250 units/hr. Per RN, no issues with the heparin infusion running continuously or signs/symptoms of bleeding. Labs drawn from opposite side of infusion.  Goal of Therapy:  Heparin level 0.3-0.7 units/ml aPTT 66-102 Monitor platelets by anticoagulation protocol: Yes   Plan:  Decrease heparin infusion to 1100 units/hr Check aPTT/ anti-Xa level in 8 hours and daily while on heparin Check aPTT/ anti-Xa level daily until levels correlate then switch to heparin levels Continue to monitor H&H and platelets  Arabella Merles, PharmD. Clinical Pharmacist 03/28/2023 5:10 AM

## 2023-03-28 NOTE — Plan of Care (Signed)
   Problem: Education: Goal: Knowledge of disease or condition will improve Outcome: Progressing

## 2023-03-28 NOTE — Progress Notes (Signed)
PHARMACY - ANTICOAGULATION CONSULT NOTE  Pharmacy Consult for heparin Indication: atrial fibrillation  Patient Measurements: Height: 5\' 9"  (175.3 cm) Weight: 69 kg (152 lb 3.2 oz) IBW/kg (Calculated) : 66.2 Heparin Dosing Weight: 69 Kg  Vital Signs: Temp: 98.6 F (37 C) (02/01 1010) Temp Source: Oral (02/01 1010) BP: 83/58 (02/01 1010) Pulse Rate: 78 (02/01 0414)  Labs: Recent Labs    03/26/23 1918 03/26/23 2209 03/27/23 1601 03/28/23 0346 03/28/23 1247  HGB 11.8*  --   --  10.8*  --   HCT 37.5  --   --  33.4*  --   PLT 248  --   --  188  --   APTT  --   --  46* 127* 105*  HEPARINUNFRC  --   --  >1.10* >1.10*  --   CREATININE 1.40*  --   --   --   --   TROPONINIHS 7 7  --   --   --     Estimated Creatinine Clearance: 46.3 mL/min (A) (by C-G formula based on SCr of 1.4 mg/dL (H)).   Assessment: 34 yoF presenting to the ED with atrial fibrillation. Patient reports taking Eliquis prior to admission (LD 1/30 PM) CBC stable. Pharmacy consulted to dose heparin infusion.   -heparin level falsely (>1.1) elevated given recent eliquis use and repeat aPTT slightly supra-therapeutic (105) on 1250 units/hr. Per RN, no issues with the heparin infusion running continuously or signs/symptoms of bleeding. Labs drawn from opposite side of infusion.  Goal of Therapy:  Heparin level 0.3-0.7 units/ml aPTT 66-102 Monitor platelets by anticoagulation protocol: Yes   Plan:  Decrease heparin infusion to 1000 units/hr Check aPTT/ anti-Xa level in 8 hours and daily while on heparin Check aPTT/ anti-Xa level daily until levels correlate then switch to heparin levels Continue to monitor H&H and platelets   Verdene Rio, PharmD PGY1 Pharmacy Resident

## 2023-03-28 NOTE — Progress Notes (Signed)
   Patient Name: Deb Loudin Date of Encounter: 03/28/2023 [facility] Cardiologist: Verne Carrow, MD   Interval Summary  .    No acute overnight events. Feels well. Ambulating. No new or acute complaints.  Vital Signs .    Vitals:   03/27/23 2001 03/27/23 2238 03/27/23 2327 03/28/23 0414  BP: 94/62 96/69 90/60  92/60  Pulse: 79 85 75 78  Resp: 18  16 14   Temp: 98.2 F (36.8 C)  98.8 F (37.1 C) 97.9 F (36.6 C)  TempSrc: Oral  Oral Oral  SpO2: 98%  96% 92%  Weight:      Height:        Intake/Output Summary (Last 24 hours) at 03/28/2023 0849 Last data filed at 03/27/2023 1204 Gross per 24 hour  Intake 400 ml  Output --  Net 400 ml      03/27/2023    3:48 PM 03/26/2023    6:16 PM 03/21/2023    1:05 PM  Last 3 Weights  Weight (lbs) 152 lb 3.2 oz 154 lb 154 lb  Weight (kg) 69.037 kg 69.854 kg 69.854 kg      Telemetry/ECG    Sinus rhythm, occasional PVCs - Personally Reviewed  Physical Exam .   GEN: No acute distress.   Neck: No JVD Cardiac: Normal rate, regular rhythm Respiratory: Clear to auscultation bilaterally. GI: Soft, non-distended  MS: No edema  Assessment & Plan .     Leatha Rohner is a 58 y.o. female with a history of prior CAD s/p STEMI 08/2015, HFrEF s/p ICD with malfunction of ICD lead s/p revision 03/2022, atrial fibrillation, mitral regurgitation, HLD, CKD III, PVC's and GIB who was admitted for symptomatic atrial fibrillation. Her atrial fibrillation episodes are accompanied by acute decompensated heart failure in the setting of LBBB. The patient converted to sinus rhythm during TEE prior to DCCV being performed.   #. Chronic systolic heart failure #. LBBB - Upgrade to CRT planned for Monday.  - NPO after midnight Sunday.  - Heparin off at midnight Sunday.   #. Persistent atrial fibrillation: Spontaneously converted to sinus rhythm during TEE prior to DCCV being performed. Remains in NSR today. - Continue home Tikoysn. Will  order daily BMP.  - Continue heparin this morning. - Tentative AV node ablation at time of CS placement if CRT successful.   For questions or updates, please contact [facility] Please consult [email or web address] for contact info under        Signed, Nobie Putnam, MD

## 2023-03-29 ENCOUNTER — Encounter (HOSPITAL_COMMUNITY): Payer: Self-pay | Admitting: Anesthesiology

## 2023-03-29 DIAGNOSIS — I4891 Unspecified atrial fibrillation: Secondary | ICD-10-CM | POA: Diagnosis not present

## 2023-03-29 LAB — BASIC METABOLIC PANEL
Anion gap: 9 (ref 5–15)
BUN: 15 mg/dL (ref 6–20)
CO2: 24 mmol/L (ref 22–32)
Calcium: 8.7 mg/dL — ABNORMAL LOW (ref 8.9–10.3)
Chloride: 104 mmol/L (ref 98–111)
Creatinine, Ser: 1.17 mg/dL — ABNORMAL HIGH (ref 0.44–1.00)
GFR, Estimated: 54 mL/min — ABNORMAL LOW (ref >60)
Glucose, Bld: 162 mg/dL — ABNORMAL HIGH (ref 70–99)
Potassium: 3.7 mmol/L (ref 3.5–5.1)
Sodium: 137 mmol/L (ref 135–145)

## 2023-03-29 LAB — CBC
HCT: 32.8 % — ABNORMAL LOW (ref 36.0–46.0)
Hemoglobin: 10.5 g/dL — ABNORMAL LOW (ref 12.0–15.0)
MCH: 29.5 pg (ref 26.0–34.0)
MCHC: 32 g/dL (ref 30.0–36.0)
MCV: 92.1 fL (ref 80.0–100.0)
Platelets: 180 10*3/uL (ref 150–400)
RBC: 3.56 MIL/uL — ABNORMAL LOW (ref 3.87–5.11)
RDW: 14 % (ref 11.5–15.5)
WBC: 6.1 10*3/uL (ref 4.0–10.5)
nRBC: 0 % (ref 0.0–0.2)

## 2023-03-29 LAB — SURGICAL PCR SCREEN
MRSA, PCR: NEGATIVE
Staphylococcus aureus: NEGATIVE

## 2023-03-29 LAB — HEPARIN LEVEL (UNFRACTIONATED)
Heparin Unfractionated: 0.62 [IU]/mL (ref 0.30–0.70)
Heparin Unfractionated: 0.56 [IU]/mL (ref 0.30–0.70)

## 2023-03-29 LAB — APTT
aPTT: 79 s — ABNORMAL HIGH (ref 24–36)
aPTT: 69 s — ABNORMAL HIGH (ref 24–36)

## 2023-03-29 LAB — MAGNESIUM: Magnesium: 1.8 mg/dL (ref 1.7–2.4)

## 2023-03-29 MED ORDER — SODIUM CHLORIDE 0.9 % IV SOLN
80.0000 mg | INTRAVENOUS | Status: AC
  Filled 2023-03-29: qty 2

## 2023-03-29 MED ORDER — CHLORHEXIDINE GLUCONATE 4 % EX SOLN: 60.0000 mL | Freq: Once | CUTANEOUS | Status: DC

## 2023-03-29 MED ORDER — SODIUM CHLORIDE 0.9 % IV SOLN: INTRAVENOUS | Status: DC

## 2023-03-29 MED ORDER — CHLORHEXIDINE GLUCONATE 4 % EX SOLN
60.0000 mL | Freq: Once | CUTANEOUS | Status: AC
  Administered 2023-03-30: 4 via TOPICAL
  Filled 2023-03-29: qty 60

## 2023-03-29 MED ORDER — CEFAZOLIN SODIUM-DEXTROSE 2-4 GM/100ML-% IV SOLN
2.0000 g | INTRAVENOUS | Status: AC
  Administered 2023-03-30: 2 g via INTRAVENOUS
  Filled 2023-03-29: qty 100

## 2023-03-29 MED ORDER — POTASSIUM CHLORIDE 20 MEQ PO PACK
40.0000 meq | PACK | Freq: Once | ORAL | Status: DC
  Filled 2023-03-29: qty 2

## 2023-03-29 MED ORDER — POTASSIUM CHLORIDE CRYS ER 20 MEQ PO TBCR
40.0000 meq | EXTENDED_RELEASE_TABLET | Freq: Once | ORAL | Status: AC
  Administered 2023-03-29: 40 meq via ORAL
  Filled 2023-03-29: qty 2

## 2023-03-29 MED ORDER — CHLORHEXIDINE GLUCONATE 4 % EX SOLN
60.0000 mL | Freq: Once | CUTANEOUS | Status: AC
  Administered 2023-03-29: 4 via TOPICAL
  Filled 2023-03-29: qty 60

## 2023-03-29 MED ORDER — MAGNESIUM SULFATE 2 GM/50ML IV SOLN
2.0000 g | Freq: Once | INTRAVENOUS | Status: AC
  Administered 2023-03-29: 2 g via INTRAVENOUS
  Filled 2023-03-29: qty 50

## 2023-03-29 NOTE — Progress Notes (Signed)
   03/29/23 0810  Assess: MEWS Score  Temp 98.4 F (36.9 C)  Pulse Rate 78  Resp 13  Level of Consciousness Alert  O2 Device Room Air  Assess: MEWS Score  MEWS Temp 0  MEWS Systolic 1  MEWS Pulse 0  MEWS RR 1  MEWS LOC 0  MEWS Score 2  MEWS Score Color Yellow  Assess: if the MEWS score is Yellow or Red  Were vital signs accurate and taken at a resting state? Yes  Does the patient meet 2 or more of the SIRS criteria? No  MEWS guidelines implemented  Yes, yellow  Treat  MEWS Interventions Considered administering scheduled or prn medications/treatments as ordered  Take Vital Signs  Increase Vital Sign Frequency  Yellow: Q2hr x1, continue Q4hrs until patient remains green for 12hrs  Escalate  MEWS: Escalate Yellow: Discuss with charge nurse and consider notifying provider and/or RRT  Notify: Charge Nurse/RN  Name of Charge Nurse/RN Notified Shanda Bumps rn  Provider Notification  Provider Name/Title Dr Jimmey Ralph  Date Provider Notified 03/29/23  Time Provider Notified [phone]  Method of Notification Page (secure chat)  Notification Reason Other (Comment) (change in mews)  Provider response No new orders (provider "seen" nofification noted, no response from MD)  Date of Provider Response 03/29/23  Assess: SIRS CRITERIA  SIRS Temperature  0  SIRS Respirations  0  SIRS Pulse 0  SIRS WBC 0  SIRS Score Sum  0

## 2023-03-29 NOTE — Progress Notes (Signed)
PHARMACY - ANTICOAGULATION CONSULT NOTE  Pharmacy Consult for heparin Indication: atrial fibrillation  Patient Measurements: Height: 5\' 9"  (175.3 cm) Weight: 69 kg (152 lb 3.2 oz) IBW/kg (Calculated) : 66.2 Heparin Dosing Weight: 69 Kg  Vital Signs: Temp: 98 F (36.7 C) (02/02 1410) Temp Source: Oral (02/02 1410) BP: 95/64 (02/02 1554) Pulse Rate: 78 (02/02 1554)  Labs: Recent Labs    03/26/23 1918 03/26/23 2209 03/27/23 1601 03/28/23 0346 03/28/23 1247 03/29/23 0851 03/29/23 0852 03/29/23 1744  HGB 11.8*  --   --  10.8*  --  10.5*  --   --   HCT 37.5  --   --  33.4*  --  32.8*  --   --   PLT 248  --   --  188  --  180  --   --   APTT  --   --    < > 127* 105*  --  79* 69*  HEPARINUNFRC  --   --    < > >1.10*  --   --  0.62 0.56  CREATININE 1.40*  --   --   --   --   --  1.17*  --   TROPONINIHS 7 7  --   --   --   --   --   --    < > = values in this interval not displayed.    Estimated Creatinine Clearance: 55.4 mL/min (A) (by C-G formula based on SCr of 1.17 mg/dL (H)).   Assessment: 62 yoF presenting to the ED with atrial fibrillation. Patient reports taking Eliquis prior to admission (LD 1/30 PM) CBC stable. Pharmacy consulted to dose heparin infusion.   Evening labs - aPTT (69). HL (0.56). No issues with heparin infusion or signs of bleeding noted. Hgb 10s, PLTc wnl.    Goal of Therapy:  Heparin level 0.3-0.7 units/ml aPTT 66-102 Monitor platelets by anticoagulation protocol: Yes   Plan:  Continue heparin infusion at 1000 units/hr Planning to d/c heparin tonight at midnight for Monday surgery  Check aPTT/ anti-Xa level daily until levels correlate then switch to heparin levels Continue to monitor H&H and platelets   Ruben Im, PharmD Clinical Pharmacist 03/29/2023 6:58 PM Please check AMION for all [facility] Pharmacy numbers

## 2023-03-29 NOTE — Progress Notes (Signed)
   Patient Name: Catherine Mays Date of Encounter: 03/29/2023 [facility] Cardiologist: Verne Carrow, MD   Interval Summary  .    No acute overnight events. Continues to report feeling well. No new or acute complaints.  Vital Signs .    Vitals:   03/28/23 0955 03/28/23 1010 03/28/23 2018 03/29/23 0500  BP: (!) 88/65 (!) 83/58 95/66 96/72   Pulse:    67  Resp:  16  17  Temp:  98.6 F (37 C) 99 F (37.2 C) 98.2 F (36.8 C)  TempSrc:  Oral Oral Oral  SpO2:    95%  Weight:      Height:        Intake/Output Summary (Last 24 hours) at 03/29/2023 0806 Last data filed at 03/28/2023 2015 Gross per 24 hour  Intake 240 ml  Output --  Net 240 ml      03/27/2023    3:48 PM 03/26/2023    6:16 PM 03/21/2023    1:05 PM  Last 3 Weights  Weight (lbs) 152 lb 3.2 oz 154 lb 154 lb  Weight (kg) 69.037 kg 69.854 kg 69.854 kg      Telemetry/ECG    Sinus rhythm, PVCs - Personally Reviewed  Physical Exam .   GEN: No acute distress.   Neck: No JVD Cardiac: Normal rate, regular rhythm Respiratory: Clear to auscultation bilaterally. GI: Soft, non-distended  MS: No edema  Assessment & Plan .     Gemma Ruan is a 58 y.o. female with a history of prior CAD s/p STEMI 08/2015, HFrEF s/p ICD with malfunction of ICD lead s/p revision 03/2022, atrial fibrillation, mitral regurgitation, HLD, CKD III, PVC's and GIB who was admitted for symptomatic atrial fibrillation. Her atrial fibrillation episodes are accompanied by acute decompensated heart failure in the setting of LBBB. The patient converted to sinus rhythm during TEE prior to DCCV being performed.   #. Chronic systolic heart failure #. LBBB - Upgrade to CRT tomorrow with Dr. Lalla Brothers.  - NPO after midnight.  - Heparin off at midnight.   #. Persistent atrial fibrillation: Spontaneously converted to sinus rhythm during TEE prior to DCCV being performed. Remains in NSR today. - Continue home Tikoysn. Check daily BMP.  -  Continue heparin until midnight. Will resume oral anti-coagulation 48-72 hours after implant. - Tentative AV node ablation at time of CS placement if CRT successful.   For questions or updates, please contact [facility] Please consult [email or web address] for contact info under        Signed, Nobie Putnam, MD

## 2023-03-29 NOTE — Plan of Care (Signed)
  Problem: Activity: Goal: Ability to tolerate increased activity will improve Outcome: Progressing   Problem: Coping: Goal: Level of anxiety will decrease Outcome: Progressing   Problem: Education: Goal: Knowledge of disease or condition will improve Outcome: Completed/Met Goal: Understanding of medication regimen will improve Outcome: Completed/Met   Problem: Cardiac: Goal: Ability to achieve and maintain adequate cardiopulmonary perfusion will improve Outcome: Completed/Met   Problem: Health Behavior/Discharge Planning: Goal: Ability to safely manage health-related needs after discharge will improve Outcome: Completed/Met   Problem: Education: Goal: Knowledge of General Education information will improve Description: Including pain rating scale, medication(s)/side effects and non-pharmacologic comfort measures Outcome: Completed/Met   Problem: Activity: Goal: Risk for activity intolerance will decrease Outcome: Completed/Met   Problem: Nutrition: Goal: Adequate nutrition will be maintained Outcome: Completed/Met   Problem: Elimination: Goal: Will not experience complications related to bowel motility Outcome: Completed/Met Goal: Will not experience complications related to urinary retention Outcome: Completed/Met

## 2023-03-29 NOTE — Progress Notes (Signed)
PHARMACY - ANTICOAGULATION CONSULT NOTE  Pharmacy Consult for heparin Indication: atrial fibrillation  Patient Measurements: Height: 5\' 9"  (175.3 cm) Weight: 69 kg (152 lb 3.2 oz) IBW/kg (Calculated) : 66.2 Heparin Dosing Weight: 69 Kg  Vital Signs: Temp: 98.4 F (36.9 C) (02/02 0810) Temp Source: Oral (02/02 0810) BP: 96/72 (02/02 0500) Pulse Rate: 78 (02/02 0810)  Labs: Recent Labs    03/26/23 1918 03/26/23 2209 03/27/23 1601 03/28/23 0346 03/28/23 1247 03/29/23 0851 03/29/23 0852  HGB 11.8*  --   --  10.8*  --  10.5*  --   HCT 37.5  --   --  33.4*  --  32.8*  --   PLT 248  --   --  188  --  180  --   APTT  --   --  46* 127* 105*  --   --   HEPARINUNFRC  --   --  >1.10* >1.10*  --   --  0.62  CREATININE 1.40*  --   --   --   --   --  1.17*  TROPONINIHS 7 7  --   --   --   --   --     Estimated Creatinine Clearance: 55.4 mL/min (A) (by C-G formula based on SCr of 1.17 mg/dL (H)).   Assessment: Catherine Mays presenting to the ED with atrial fibrillation. Patient reports taking Eliquis prior to admission (LD 1/30 PM) CBC stable. Pharmacy consulted to dose heparin infusion.   AM Labs therapeutic - aPTT (79). HL (0.62). No issues with heparin infusion or signs of bleeding noted. Hgb 10s, PLTc wnl.    Goal of Therapy:  Heparin level 0.3-0.7 units/ml aPTT 66-102 Monitor platelets by anticoagulation protocol: Yes   Plan:  Continue heparin infusion at 1000 units/hr Planning to d/c heparin tonight at midnight for Monday surgery  Check confirmatory aPTT/ anti-Xa level in 8 hours while on heparin Check aPTT/ anti-Xa level daily until levels correlate then switch to heparin levels Continue to monitor H&H and platelets   Verdene Rio, PharmD PGY1 Pharmacy Resident

## 2023-03-30 ENCOUNTER — Other Ambulatory Visit: Payer: Self-pay

## 2023-03-30 ENCOUNTER — Encounter (HOSPITAL_COMMUNITY): Payer: Self-pay | Admitting: Anesthesiology

## 2023-03-30 ENCOUNTER — Encounter (HOSPITAL_COMMUNITY)
Admission: EM | Disposition: A | Discharge status: Home / Self Care | Payer: Self-pay | Source: Home / Self Care | Attending: Internal Medicine

## 2023-03-30 ENCOUNTER — Encounter (HOSPITAL_COMMUNITY): Payer: Self-pay | Admitting: Cardiology

## 2023-03-30 DIAGNOSIS — I447 Left bundle-branch block, unspecified: Secondary | ICD-10-CM

## 2023-03-30 DIAGNOSIS — I5022 Chronic systolic (congestive) heart failure: Secondary | ICD-10-CM | POA: Diagnosis not present

## 2023-03-30 DIAGNOSIS — I4819 Other persistent atrial fibrillation: Secondary | ICD-10-CM | POA: Diagnosis not present

## 2023-03-30 HISTORY — PX: BIV UPGRADE: EP1202

## 2023-03-30 LAB — CBC
HCT: 32.5 % — ABNORMAL LOW (ref 36.0–46.0)
Hemoglobin: 10.5 g/dL — ABNORMAL LOW (ref 12.0–15.0)
MCH: 29.6 pg (ref 26.0–34.0)
MCHC: 32.3 g/dL (ref 30.0–36.0)
MCV: 91.5 fL (ref 80.0–100.0)
Platelets: 177 10*3/uL (ref 150–400)
RBC: 3.55 MIL/uL — ABNORMAL LOW (ref 3.87–5.11)
RDW: 14 % (ref 11.5–15.5)
WBC: 6.4 10*3/uL (ref 4.0–10.5)
nRBC: 0 % (ref 0.0–0.2)

## 2023-03-30 LAB — APTT: aPTT: 31 s (ref 24–36)

## 2023-03-30 LAB — BASIC METABOLIC PANEL
Anion gap: 9 (ref 5–15)
BUN: 13 mg/dL (ref 6–20)
CO2: 20 mmol/L — ABNORMAL LOW (ref 22–32)
Calcium: 8.8 mg/dL — ABNORMAL LOW (ref 8.9–10.3)
Chloride: 110 mmol/L (ref 98–111)
Creatinine, Ser: 1.02 mg/dL — ABNORMAL HIGH (ref 0.44–1.00)
GFR, Estimated: >60 mL/min (ref >60)
Glucose, Bld: 94 mg/dL (ref 70–99)
Potassium: 4.8 mmol/L (ref 3.5–5.1)
Sodium: 139 mmol/L (ref 135–145)

## 2023-03-30 LAB — MAGNESIUM: Magnesium: 2.1 mg/dL (ref 1.7–2.4)

## 2023-03-30 LAB — HEPARIN LEVEL (UNFRACTIONATED): Heparin Unfractionated: <0.1 [IU]/mL — ABNORMAL LOW (ref 0.30–0.70)

## 2023-03-30 SURGERY — BIV UPGRADE

## 2023-03-30 MED ORDER — METHYLPREDNISOLONE SODIUM SUCC 125 MG IJ SOLR
INTRAMUSCULAR | Status: AC
Start: 2023-03-30 — End: ?
  Filled 2023-03-30: qty 2

## 2023-03-30 MED ORDER — CEFAZOLIN SODIUM-DEXTROSE 2-4 GM/100ML-% IV SOLN
INTRAVENOUS | Status: AC
  Filled 2023-03-30: qty 100

## 2023-03-30 MED ORDER — ACETAMINOPHEN 500 MG PO TABS
1000.0000 mg | ORAL_TABLET | Freq: Once | ORAL | Status: DC
Start: 2023-03-30 — End: 2023-03-30

## 2023-03-30 MED ORDER — FENTANYL CITRATE (PF) 100 MCG/2ML IJ SOLN
INTRAMUSCULAR | Status: DC | PRN
  Administered 2023-03-30: 25 ug via INTRAVENOUS
  Administered 2023-03-30 (×3): 12.5 ug via INTRAVENOUS

## 2023-03-30 MED ORDER — DIPHENHYDRAMINE HCL 50 MG/ML IJ SOLN
INTRAMUSCULAR | Status: AC
  Filled 2023-03-30: qty 1

## 2023-03-30 MED ORDER — MIDAZOLAM HCL 2 MG/2ML IJ SOLN
INTRAMUSCULAR | Status: AC
  Filled 2023-03-30: qty 2

## 2023-03-30 MED ORDER — LIDOCAINE HCL (PF) 1 % IJ SOLN
INTRAMUSCULAR | Status: DC | PRN
  Administered 2023-03-30: 60 mL

## 2023-03-30 MED ORDER — SODIUM CHLORIDE 0.9 % IV SOLN
INTRAVENOUS | Status: AC
  Administered 2023-03-30: 80 mg
  Filled 2023-03-30: qty 2

## 2023-03-30 MED ORDER — IOHEXOL 350 MG/ML SOLN
INTRAVENOUS | Status: DC | PRN
  Administered 2023-03-30: 5 mL
  Administered 2023-03-30: 3 mL
  Administered 2023-03-30: 5 mL
  Administered 2023-03-30: 15 mL

## 2023-03-30 MED ORDER — FENTANYL CITRATE (PF) 100 MCG/2ML IJ SOLN
INTRAMUSCULAR | Status: AC
  Filled 2023-03-30: qty 2

## 2023-03-30 MED ORDER — LIDOCAINE HCL (PF) 1 % IJ SOLN
INTRAMUSCULAR | Status: AC
  Filled 2023-03-30: qty 60

## 2023-03-30 MED ORDER — MIDAZOLAM HCL 5 MG/5ML IJ SOLN
INTRAMUSCULAR | Status: DC | PRN
  Administered 2023-03-30 (×3): .5 mg via INTRAVENOUS
  Administered 2023-03-30: 1 mg via INTRAVENOUS

## 2023-03-30 MED ORDER — DIGOXIN 125 MCG PO TABS
0.1250 mg | ORAL_TABLET | Freq: Every day | ORAL | Status: DC
Start: 2023-03-31 — End: 2023-04-02
  Administered 2023-03-31 – 2023-04-02 (×3): 0.125 mg via ORAL
  Filled 2023-03-30 (×3): qty 1

## 2023-03-30 SURGICAL SUPPLY — 18 items
BALLN COR SINUS VENO 6FR 80 (BALLOONS) ×1 IMPLANT
BALLOON COR SINUS VENO 6FR 80 (BALLOONS) IMPLANT
CABLE SURGICAL S-101-97-12 (CABLE) ×1 IMPLANT
CATH CPS DIRECT 135 DS2C020 (CATHETERS) IMPLANT
ICD GALLANT HFCRTD CDHFA500Q (ICD Generator) IMPLANT
KIT ESSENTIALS PG (KITS) IMPLANT
LEAD QUARTET 1458Q-86CM (Lead) IMPLANT
LEAD ULTIPACE 52 LPA1231/52 (Lead) IMPLANT
PAD DEFIB RADIO PHYSIO CONN (PAD) ×1 IMPLANT
POUCH AIGIS-R ANTIBACT ICD (Mesh General) ×1 IMPLANT
POUCH AIGIS-R ANTIBACT ICD LRG (Mesh General) IMPLANT
SHEATH 7FR PRELUDE SNAP 13 (SHEATH) IMPLANT
SHEATH 9.5FR PRELUDE SNAP 13 (SHEATH) IMPLANT
SHEATH PROBE COVER 6X72 (BAG) IMPLANT
SLITTER AGILIS HISPRO (INSTRUMENTS) IMPLANT
TRAY PACEMAKER INSERTION (PACKS) ×1 IMPLANT
WIRE ACUITY WHISPER EDS 4648 (WIRE) IMPLANT
WIRE HI TORQ VERSACORE-J 145CM (WIRE) IMPLANT

## 2023-03-30 NOTE — Progress Notes (Addendum)
  Patient Name: Catherine Mays Date of Encounter: 03/30/2023  Primary Cardiologist: Verne Carrow, MD Electrophysiologist: Lanier Prude, MD  Interval Summary   The patient is doing well today.  At this time, the patient denies chest pain, shortness of breath, or any new concerns.  Vital Signs    Vitals:   03/29/23 1410 03/29/23 1554 03/29/23 1951 03/30/23 0426  BP: (!) 88/62 95/64 (!) 93/59 99/70  Pulse: 72 78 71 77  Resp: 16  18 18   Temp: 98 F (36.7 C)  98 F (36.7 C) 99 F (37.2 C)  TempSrc: Oral  Oral Oral  SpO2: 99%  100% 94%  Weight:      Height:        Intake/Output Summary (Last 24 hours) at 03/30/2023 0750 Last data filed at 03/30/2023 0545 Gross per 24 hour  Intake 51.21 ml  Output --  Net 51.21 ml   Filed Weights   03/26/23 1816 03/27/23 1548  Weight: 69.9 kg 69 kg    Physical Exam    GEN- The patient is well appearing, alert and oriented x 3 today.   Lungs- Clear to ausculation bilaterally, normal work of breathing Cardiac- Regular rate and rhythm, no murmurs, rubs or gallops GI- soft, NT, ND, + BS Extremities- no clubbing or cyanosis. No edema  Telemetry    NSR 70s, occasionally PVCs (personally reviewed)  Hospital Course    Catherine Mays is a 58 y.o. female with a history of prior CAD s/p STEMI 08/2015, HFrEF s/p ICD with malfunction of ICD lead s/p revision 03/2022, atrial fibrillation, mitral regurgitation, HLD, CKD III, PVC's and GIB who was admitted for symptomatic atrial fibrillation. Her atrial fibrillation episodes are accompanied by acute decompensated heart failure in the setting of LBBB. The patient converted to sinus rhythm during TEE prior to DCCV being performed.   Assessment & Plan    Chronic systolic CHF LBBB Plan for CRT upgrade today +/- AV nodal ablation pending results Explained risks, benefits, and alternatives to ICD ugprade, including but not limited to bleeding, infection, pneumothorax, pericardial effusion, lead  dislodgement, heart attack, stroke, or death.  Pt verbalized understanding and agrees to proceed.       Persistent AF Continue tikosyn Converted during TEE earlier this admission and remains in NSR Heparin held since midnight, will discuss OAC resumption timing with MD, pending course.   For questions or updates, please contact [facility] Please consult [email or web address] for contact info under Cardiology/STEMI.  Signed, Graciella Freer, PA-C  03/30/2023, 7:50 AM

## 2023-03-30 NOTE — TOC Progression Note (Signed)
Transition of Care [facility]) - Progression Note    Patient Details  Name: Catherine Mays MRN: [id number] Date of Birth: 15-Jan-1966  Transition of Care [facility]) CM/SW Contact  Nicanor Bake Phone Number: [phone] 03/30/2023, 3:55 PM  Clinical Narrative:  HF CSW met with pt and spouse at bedside. Pt stated that she is feeling "good." Pt and husband stated that they are happy she is doing so well. Pt stated at this time they did not need anything or having any questions at this time. CSW explained that we will continue to follow up until dc.   TOC will continue following.      Expected Discharge Plan: Home/Self Care Barriers to Discharge: Continued Medical Work up  Expected Discharge Plan and Services   Discharge Planning Services: CM Consult   Living arrangements for the past 2 months: Single Family Home                                       Social Determinants of Health (SDOH) Interventions SDOH Screenings   Food Insecurity: No Food Insecurity (03/27/2023)  Housing: Low Risk  (03/27/2023)  Recent Concern: Housing - High Risk (01/17/2023)  Transportation Needs: No Transportation Needs (03/27/2023)  Utilities: Not At Risk (03/27/2023)  Alcohol Screen: Low Risk  (02/10/2020)  Depression (PHQ2-9): Low Risk  (02/10/2020)  Financial Resource Strain: Low Risk  (02/10/2020)  Physical Activity: Insufficiently Active (02/10/2020)  Social Connections: Socially Integrated (03/27/2023)  Stress: No Stress Concern Present (02/10/2020)  Tobacco Use: Low Risk  (03/27/2023)    Readmission Risk Interventions     No data to display

## 2023-03-30 NOTE — Progress Notes (Signed)
Patient ID: Catherine Mays, female   DOB: 1965/08/29, 58 y.o.   MRN: [id number]     Advanced Heart Failure Rounding Note  Cardiologist: Verne Carrow, MD  Chief Complaint: Atrial fibrillation Subjective:    No further atrial fibrillation.  She remains in NSR today.  SBP stable in the 90s.    No dyspnea/palpitations.    Objective:   Weight Range: 69 kg Body mass index is 22.48 kg/m.   Vital Signs:   Temp:  [98 F (36.7 C)-99 F (37.2 C)] 99 F (37.2 C) (02/03 0426) Pulse Rate:  [71-78] 77 (02/03 0426) Resp:  [16-18] 18 (02/03 0426) BP: (88-99)/(59-75) 92/75 (02/03 0949) SpO2:  [94 %-100 %] 94 % (02/03 0426) Last BM Date : 03/30/23  Weight change: Filed Weights   03/26/23 1816 03/27/23 1548  Weight: 69.9 kg 69 kg    Intake/Output:   Intake/Output Summary (Last 24 hours) at 03/30/2023 1037 Last data filed at 03/30/2023 0545 Gross per 24 hour  Intake 51.21 ml  Output --  Net 51.21 ml      Physical Exam    General:  Well appearing. No resp difficulty HEENT: Normal Neck: Supple. JVP not elevated. Carotids 2+ bilat; no bruits. No lymphadenopathy or thyromegaly appreciated. Cor: PMI nondisplaced. Regular rate & rhythm. No rubs, gallops or murmurs. Lungs: Clear Abdomen: Soft, nontender, nondistended. No hepatosplenomegaly. No bruits or masses. Good bowel sounds. Extremities: No cyanosis, clubbing, rash, edema Neuro: Alert & orientedx3, cranial nerves grossly intact. moves all 4 extremities w/o difficulty. Affect pleasant   Telemetry   NSR 70s, LBBB (personally reviewed)   Labs    CBC Recent Labs    03/29/23 0851 03/30/23 0406  WBC 6.1 6.4  HGB 10.5* 10.5*  HCT 32.8* 32.5*  MCV 92.1 91.5  PLT 180 177   Basic Metabolic Panel Recent Labs    57/84/69 0852 03/30/23 0406  NA 137 139  K 3.7 4.8  CL 104 110  CO2 24 20*  GLUCOSE 162* 94  BUN 15 13  CREATININE 1.17* 1.02*  CALCIUM 8.7* 8.8*  MG 1.8 2.1   Liver Function Tests No results for  input(s): "AST", "ALT", "ALKPHOS", "BILITOT", "PROT", "ALBUMIN" in the last 72 hours. No results for input(s): "LIPASE", "AMYLASE" in the last 72 hours. Cardiac Enzymes No results for input(s): "CKTOTAL", "CKMB", "CKMBINDEX", "TROPONINI" in the last 72 hours.  BNP: BNP (last 3 results) Recent Labs    01/17/23 0455 02/09/23 1041 02/11/23 1418  BNP 1,308.6* 483.2* 1,393.2*    ProBNP (last 3 results) No results for input(s): "PROBNP" in the last 8760 hours.   D-Dimer No results for input(s): "DDIMER" in the last 72 hours. Hemoglobin A1C No results for input(s): "HGBA1C" in the last 72 hours. Fasting Lipid Panel No results for input(s): "CHOL", "HDL", "LDLCALC", "TRIG", "CHOLHDL", "LDLDIRECT" in the last 72 hours. Thyroid Function Tests No results for input(s): "TSH", "T4TOTAL", "T3FREE", "THYROIDAB" in the last 72 hours.  Invalid input(s): "FREET3"  Other results:   Imaging    No results found.   Medications:     Scheduled Medications:  acetaminophen  1,000 mg Oral Once   [START ON 03/31/2023] digoxin  0.125 mg Oral Daily   dofetilide  250 mcg Oral BID   ferrous sulfate  325 mg Oral Daily   gentamicin (GARAMYCIN) 80 mg in sodium chloride 0.9 % 500 mL irrigation  80 mg Irrigation On Call   metoprolol succinate  25 mg Oral QHS   rosuvastatin  20 mg Oral  Daily   sertraline  25 mg Oral Daily   spironolactone  25 mg Oral Daily   thyroid  120 mg Oral QAC breakfast   traZODone  25 mg Oral QHS    Infusions:  sodium chloride     sodium chloride 50 mL/hr at 03/30/23 0545    ceFAZolin (ANCEF) IV      PRN Medications: acetaminophen, ondansetron (ZOFRAN) IV    Assessment/Plan   1. Atrial fibrillation: Unable to tolerate amiodarone due to GI side effects. She failed sotalol. She was started on Tikosyn 12/24, but went back into AF prior to this admission.  Spontaneous conversion to NSR this admission.  She tolerates AF very poorly.  - Continue Tikosyn.   - Continue  Eliquis 5 mg bid (held for device upgrade) - Will get upgrade to CRT today, plan to hold off on AV nodal ablation for now (discussed again this morning with Dr. Lalla Brothers), would favor AF ablation in the future.  2. CAD: S/p anterior MI in 7/17 with DES to RCA.  Cath in 10/19 with no obstructive CAD.  No chest pain.  - She is off Plavix now and taking apixaban 5 mg bid at home.   - She is tolerating Crestor without myalgias.      3. Chronic systolic CHF: Ischemic cardiomyopathy.  EF 25% on echo in 10/17, 30-35% on echo in 11/17, 30% on TEE in 8/18. She has extensive LAD-territory scar. 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  Echo in 3/23 showed that EF remained 25-30% with LAD-territory scar. Echo 9/24 showed stable EF 25% with LAD territory akinesis, mild LV dilation, mild MR, RV normal, IVC normal.  TEE this admission with EF 20-25%, normal RV, moderate MR and TR.  She is not volume overloaded on exam.  Tolerates AF poorly.  Has wide LBBB. - Would restart home Lasix 40 mg daily tomorrow.  - Continue spironolactone 25 mg daily.   - Continue Toprol XL 25 mg at bedtime. - Off Entresto with hypotension. Would start low dose losartan tomorrow after CRT upgrade.  - She did not tolerate dapagliflozin due to yeast infections.   - Plan for CRT upgrade today.  4. Mitral regurgitation: Moderate to severe on 10/17 echo, severe on 11/17 echo.  Underwent TEE in 8/18.  This showed moderate MR that appeared to be functional.  No indication for surgery or Mitraclip. Echo in 3/23 showed mild MR. Echo 9/24 with mild MR. TEE this admission with moderate MR.  5. PVCs: Very frequent on 12/18 holter (41% beats), she has had a PVC ablation in 2/19. Due to recurrence of PVCs post-ablation, she was placed on sotalol. Unfortunately, had break through AF, with PVCs. Now off sotalol (and started on Tikosyn for AF). Few PVCs seen on telemetry.  - Continue Toprol - Continue Tikosyn 6. Hyperlipidemia: Tolerating Crestor. Goal LDL < 55.  Good LDL  in 8/24.  7. GI bleeding: With anemia.  She had episode in 10/22, has not had overt recurrence. She has been considered for Watchman.   - She wants to hold off on Watchman for now, will to consider with a close recurrence of GI bleeding. CBC stable.  8. Thrombus vs vegetation on ICD lead: Afebrile, normal WBCs.  Blood cultures sent and so far NGTD.   Length of Stay: 3  Marca Ancona, MD  03/30/2023, 10:37 AM  Advanced Heart Failure Team Pager [phone] (M-F; 7a - 5p)  Please contact [facility] for night-coverage after hours (5p -7a ) and  weekends on [email or web address]

## 2023-03-30 NOTE — Discharge Instructions (Addendum)
After Your ICD revision (Implantable Cardiac Defibrillator)   You have a Abbott ICD  ACTIVITY Do not lift your arm above shoulder height for 1 week after your procedure. After 7 days, you may progress as below.  You should remove your sling 24 hours after your procedure, unless otherwise instructed by your provider.     Monday April 06, 2023  Tuesday April 07, 2023 Wednesday April 08, 2023 Thursday April 09, 2023   Do not lift, push, pull, or carry anything over 10 pounds with the affected arm until 6 weeks (Monday May 11, 2023 ) after your procedure.   You may drive AFTER your wound check, unless you have been told otherwise by your provider.   Ask your healthcare provider when you can go back to work   INCISION/Dressing Please resume Eliquis Sunday, 2/9.  If large square, outer bandage is left in place, this can be removed after 24 hours from your procedure. Do not remove steri-strips or glue as below.   Monitor your defibrillator site for redness, swelling, and drainage. Call the device clinic at [phone] if you experience these symptoms or fever/chills.  If your incision is sealed with Steri-strips or staples, you may shower 7 days after your procedure or when told by your provider. Do not remove the steri-strips or let the shower hit directly on your site. You may wash around your site with soap and water.    If you were discharged in a sling, please do not wear this during the day more than 48 hours after your surgery unless otherwise instructed. This may increase the risk of stiffness and soreness in your shoulder.   Avoid lotions, ointments, or perfumes over your incision until it is well-healed.  You may use a hot tub or a pool AFTER your wound check appointment if the incision is completely closed.  Your ICD is designed to protect you from life threatening heart rhythms. Because of this, you may receive a shock.   1 shock with no symptoms:  Call the  office during business hours. 1 shock with symptoms (chest pain, chest pressure, dizziness, lightheadedness, shortness of breath, overall feeling unwell):  Call 911. If you experience 2 or more shocks in 24 hours:  Call 911. If you receive a shock, you should not drive for 6 months per the [place] DMV IF you receive appropriate therapy from your ICD.   ICD Alerts:  Some alerts are vibratory and others beep. These are NOT emergencies. Please call our office to let us know. If this occurs at night or on weekends, it can wait until the next business day. Send a remote transmission.  If your device is capable of reading fluid status (for heart failure), you will be offered monthly monitoring to review this with you.   DEVICE MANAGEMENT Remote monitoring is used to monitor your ICD from home. This monitoring is scheduled every 91 days by our office. It allows [place] to keep an eye on the functioning of your device to ensure it is working properly. You will routinely see your Electrophysiologist annually (more often if necessary).   You should receive your ID card for your new device in 4-8 weeks. Keep this card with you at all times once received. Consider wearing a medical alert bracelet or necklace.  Your ICD  may be MRI compatible. This will be discussed at your next office visit/wound check.  You should avoid contact with strong electric or magnetic fields.   Do not use amateur (  ham) radio equipment or electric (arc) welding torches. MP3 player headphones with magnets should not be used. Some devices are safe to use if held at least 12 inches (30 cm) from your defibrillator. These include power tools, lawn mowers, and speakers. If you are unsure if something is safe to use, ask your health care provider.  When using your cell phone, hold it to the ear that is on the opposite side from the defibrillator. Do not leave your cell phone in a pocket over the defibrillator.  You may safely use electric blankets,  heating pads, computers, and microwave ovens.  Call the office right away if: You have chest pain. You feel more than one shock. You feel more short of breath than you have felt before. You feel more light-headed than you have felt before. Your incision starts to open up.  This information is not intended to replace advice given to you by your health care provider. Make sure you discuss any questions you have with your health care provider.

## 2023-03-31 ENCOUNTER — Encounter (HOSPITAL_COMMUNITY): Payer: Self-pay | Admitting: Cardiology

## 2023-03-31 ENCOUNTER — Inpatient Hospital Stay (HOSPITAL_COMMUNITY): Payer: BC Managed Care – PPO

## 2023-03-31 ENCOUNTER — Telehealth: Payer: Self-pay

## 2023-03-31 DIAGNOSIS — I4819 Other persistent atrial fibrillation: Secondary | ICD-10-CM | POA: Diagnosis not present

## 2023-03-31 LAB — BASIC METABOLIC PANEL
Anion gap: 11 (ref 5–15)
BUN: 12 mg/dL (ref 6–20)
CO2: 22 mmol/L (ref 22–32)
Calcium: 8.7 mg/dL — ABNORMAL LOW (ref 8.9–10.3)
Chloride: 106 mmol/L (ref 98–111)
Creatinine, Ser: 1.23 mg/dL — ABNORMAL HIGH (ref 0.44–1.00)
GFR, Estimated: 51 mL/min — ABNORMAL LOW (ref >60)
Glucose, Bld: 93 mg/dL (ref 70–99)
Potassium: 4.8 mmol/L (ref 3.5–5.1)
Sodium: 139 mmol/L (ref 135–145)

## 2023-03-31 LAB — MAGNESIUM: Magnesium: 2 mg/dL (ref 1.7–2.4)

## 2023-03-31 MED ORDER — METOPROLOL SUCCINATE ER 25 MG PO TB24
12.5000 mg | ORAL_TABLET | ORAL | Status: AC
  Administered 2023-03-31: 12.5 mg via ORAL
  Filled 2023-03-31: qty 1

## 2023-03-31 MED ORDER — FUROSEMIDE 40 MG PO TABS
40.0000 mg | ORAL_TABLET | Freq: Every day | ORAL | Status: DC
  Administered 2023-03-31 – 2023-04-01 (×2): 40 mg via ORAL
  Filled 2023-03-31 (×2): qty 1

## 2023-03-31 MED FILL — Midazolam HCl Inj 2 MG/2ML (Base Equivalent): INTRAMUSCULAR | Qty: 2.5 | Status: AC

## 2023-03-31 NOTE — Progress Notes (Signed)
  Patient Name: Catherine Mays Date of Encounter: 03/31/2023  Primary Cardiologist: Lonni Cash, MD Electrophysiologist: OLE ONEIDA HOLTS, MD  Interval Summary   S/p CRT upgrade yesterday.   Unfortunately, is complaining of heaviness in her chest and fullness in her neck in the setting of having gone back into AF.   No pleuritic type pain, Device parameters stable.   Vital Signs    Vitals:   03/30/23 1341 03/30/23 2007 03/31/23 0353 03/31/23 0722  BP: 92/72 96/70 100/72 107/80  Pulse: 66 73 62 100  Resp: 16 18 18 17   Temp: 98.4 F (36.9 C) 98.8 F (37.1 C) 97.8 F (36.6 C) 98.7 F (37.1 C)  TempSrc: Oral Oral Oral Oral  SpO2: 99% 92% 94% 98%  Weight:      Height:        Intake/Output Summary (Last 24 hours) at 03/31/2023 0839 Last data filed at 03/30/2023 1601 Gross per 24 hour  Intake 375.96 ml  Output --  Net 375.96 ml   Filed Weights   03/26/23 1816 03/27/23 1548  Weight: 69.9 kg 69 kg    Physical Exam    GEN- The patient is well appearing, alert and oriented x 3 today.   Lungs- Clear to ausculation bilaterally, normal work of breathing Cardiac- Irregularly irregular rate and rhythm, no murmurs, rubs or gallops GI- soft, NT, ND, + BS Extremities- no clubbing or cyanosis. No edema  Telemetry    NSR through the night, but back in AF in 110-120s this am starting around 0720 (personally reviewed)  Hospital Course    Catherine Mays is a 58 y.o. female with a history of prior CAD s/p STEMI 08/2015, HFrEF s/p ICD with malfunction of ICD lead s/p revision 03/2022, atrial fibrillation, mitral regurgitation, HLD, CKD III, PVC's and GIB who was admitted for symptomatic atrial fibrillation. Her atrial fibrillation episodes are accompanied by acute decompensated heart failure in the setting of LBBB. The patient converted to sinus rhythm during TEE prior to DCCV being performed.   Assessment & Plan    Chronic systolic CHF LBBB S/p CRT upgrade 2/3. Deferred AV  nodal ablation given young age.  Wound care and arm restrictions reviewed with pt.    Persistent AF Continue tikosyn  250 mcg BID Rates very elevated this am.  Disposition pending course this am and response to am meds.  Plan to resume OAC 2/9 given device upgrade, will have to follow burden carefully.   Disposition pending HR for the rest of the am.   For questions or updates, please contact [facility] Please consult [email or web address] for contact info under Cardiology/STEMI.  Signed, Ozell Prentice Passey, PA-C  03/31/2023, 8:39 AM

## 2023-03-31 NOTE — Plan of Care (Signed)
  Problem: Education: Goal: Individualized Educational Video(s) Outcome: Progressing   Problem: Activity: Goal: Ability to tolerate increased activity will improve Outcome: Progressing   Problem: Health Behavior/Discharge Planning: Goal: Ability to manage health-related needs will improve Outcome: Progressing   Problem: Clinical Measurements: Goal: Ability to maintain clinical measurements within normal limits will improve Outcome: Progressing Goal: Will remain free from infection Outcome: Progressing Goal: Diagnostic test results will improve Outcome: Progressing Goal: Respiratory complications will improve Outcome: Progressing Goal: Cardiovascular complication will be avoided Outcome: Progressing   Problem: Coping: Goal: Level of anxiety will decrease Outcome: Progressing   Problem: Pain Managment: Goal: General experience of comfort will improve and/or be controlled Outcome: Progressing   Problem: Safety: Goal: Ability to remain free from injury will improve Outcome: Progressing   Problem: Skin Integrity: Goal: Risk for impaired skin integrity will decrease Outcome: Progressing   Problem: Education: Goal: Knowledge of cardiac device and self-care will improve Outcome: Progressing Goal: Ability to safely manage health related needs after discharge will improve Outcome: Progressing Goal: Individualized Educational Video(s) Outcome: Progressing   Problem: Cardiac: Goal: Ability to achieve and maintain adequate cardiopulmonary perfusion will improve Outcome: Progressing

## 2023-03-31 NOTE — Progress Notes (Addendum)
 Patient ID: Catherine Mays, female   DOB: 1965/07/09, 58 y.o.   MRN: [id number]     Advanced Heart Failure Rounding Note  Cardiologist: Lonni Cash, MD  Chief Complaint: Atrial fibrillation  Subjective:    Back in AF RVR this morning round 0700. HR 100-160s.  Sitting up on the side of the bed. Having chest and neck pressure with mild SOB. Reports a moderate headache. BLE fatigue. No dizziness  Objective:   Weight Range: 69 kg Body mass index is 22.48 kg/m.   Vital Signs:   Temp:  [97.8 F (36.6 C)-98.8 F (37.1 C)] 98.7 F (37.1 C) (02/04 0722) Pulse Rate:  [62-111] 100 (02/04 0722) Resp:  [12-28] 17 (02/04 0722) BP: (85-107)/(61-80) 107/80 (02/04 0722) SpO2:  [88 %-100 %] 98 % (02/04 0722) Last BM Date : 03/30/23  Weight change: Filed Weights   03/26/23 1816 03/27/23 1548  Weight: 69.9 kg 69 kg   Intake/Output:   Intake/Output Summary (Last 24 hours) at 03/31/2023 0842 Last data filed at 03/30/2023 1601 Gross per 24 hour  Intake 375.96 ml  Output --  Net 375.96 ml    Physical Exam    General: Well appearing. No distress on RA Cardiac: JVP not visible. S1 and S2 present. No murmurs or rub. Irregular rhythm.  Extremities: Warm and dry. No rash, cyanosis.  No peripheral edema.  Neuro: Alert and oriented x3. Affect pleasant. Moves all extremities without difficulty.  Telemetry   AF RVR 110-160s (personally reviewed)  Labs    CBC Recent Labs    03/29/23 0851 03/30/23 0406  WBC 6.1 6.4  HGB 10.5* 10.5*  HCT 32.8* 32.5*  MCV 92.1 91.5  PLT 180 177   Basic Metabolic Panel Recent Labs    97/96/74 0406 03/31/23 0456  NA 139 139  K 4.8 4.8  CL 110 106  CO2 20* 22  GLUCOSE 94 93  BUN 13 12  CREATININE 1.02* 1.23*  CALCIUM  8.8* 8.7*  MG 2.1 2.0   Liver Function Tests No results for input(s): AST, ALT, ALKPHOS, BILITOT, PROT, ALBUMIN in the last 72 hours. No results for input(s): LIPASE, AMYLASE in the last 72  hours. Cardiac Enzymes No results for input(s): CKTOTAL, CKMB, CKMBINDEX, TROPONINI in the last 72 hours.  BNP: BNP (last 3 results) Recent Labs    01/17/23 0455 02/09/23 1041 02/11/23 1418  BNP 1,308.6* 483.2* 1,393.2*   ProBNP (last 3 results) No results for input(s): PROBNP in the last 8760 hours.  D-Dimer No results for input(s): DDIMER in the last 72 hours. Hemoglobin A1C No results for input(s): HGBA1C in the last 72 hours. Fasting Lipid Panel No results for input(s): CHOL, HDL, LDLCALC, TRIG, CHOLHDL, LDLDIRECT in the last 72 hours. Thyroid  Function Tests No results for input(s): TSH, T4TOTAL, T3FREE, THYROIDAB in the last 72 hours.  Invalid input(s): FREET3  Other results:   Imaging   EP PPM/ICD IMPLANT Result Date: 03/30/2023 CONCLUSIONS: 1. Venogram of the LUE demonstrating a patent left axillary and subclavian vein 2. Successful implantation of a CRT-D for chronic systolic heart failure, left bundle branch block, atrial fibrillation 3. No early apparent complications. 4. Hold anticoagulation for 5 days (OK to restart 04/05/2023). Risk/benefits have been discussed.   Medications:    Scheduled Medications:  digoxin   0.125 mg Oral Daily   dofetilide   250 mcg Oral BID   ferrous sulfate   325 mg Oral Daily   metoprolol  succinate  25 mg Oral QHS   rosuvastatin   20 mg Oral Daily  sertraline   25 mg Oral Daily   spironolactone   25 mg Oral Daily   thyroid   120 mg Oral QAC breakfast   traZODone   25 mg Oral QHS    Infusions:    PRN Medications: acetaminophen , ondansetron  (ZOFRAN ) IV  Assessment/Plan   1. Atrial fibrillation: Unable to tolerate amiodarone  due to GI side effects. She failed sotalol . She was started on Tikosyn  12/24, but went back into AF prior to this admission.  Spontaneous conversion to NSR this admission.  She tolerates AF very poorly. Back in AF RVR this morning. S/p CRT upgrade yesterday - Continue  Tikosyn  - Continue Toprol   XL 25 mg at bedtime. Give a one time 12.5 mg this morning for high HR.  - Not on [facility] with recent procedure. Hold Eliquis  until 2/9 per EP. - Plan to hold off on AV nodal ablation for now (per Dr. Rolan and Dr. Cindie), would favor AF ablation in the future.  2. CAD: S/p anterior MI in 7/17 with DES to RCA.  Cath in 10/19 with no obstructive CAD.  No chest pain.  - Holding Eliquis , resume 2/9 per EP - She is tolerating Crestor  without myalgias.      3. Chronic systolic CHF: Ischemic cardiomyopathy.  EF 25% on echo in 10/17, 30-35% on echo in 11/17, 30% on TEE in 8/18. She has extensive LAD-territory scar. She has a [employer] ICD.  Echo in 3/23 showed that EF remained 25-30% with LAD-territory scar. Echo 9/24 showed stable EF 25% with LAD territory akinesis, mild LV dilation, mild MR, RV normal, IVC normal.  TEE this admission with EF 20-25%, normal RV, moderate MR and TR. Tolerates AF poorly.  Has wide LBBB => CRT upgrade 2/3. - Euvolemic on exam. - Continue spironolactone  25 mg daily.   - Continue Toprol  XL 25 mg at bedtime. - Off Entresto  with hypotension. Possible can start Losartan  this admission, will hold today with AF and bb use as above.  - She did not tolerate dapagliflozin  due to yeast infections.   - POD#1 CRT-D upgrade  4. Mitral regurgitation: Moderate to severe on 10/17 echo, severe on 11/17 echo.  Underwent TEE in 8/18.  This showed moderate MR that appeared to be functional.  No indication for surgery or Mitraclip. Echo in 3/23 showed mild MR. Echo 9/24 with mild MR. TEE this admission with moderate MR.  5. PVCs: Very frequent on 12/18 holter (41% beats), she has had a PVC ablation in 2/19. Due to recurrence of PVCs post-ablation, she was placed on sotalol . Unfortunately, had break through AF, with PVCs. Now off sotalol  (and started on Tikosyn  for AF). Few PVCs seen on telemetry.  - Continue Toprol  - Continue Tikosyn  6. Hyperlipidemia: Tolerating Crestor .  Goal LDL < 55.  Good LDL in 8/24.  7. GI bleeding: With anemia.  She had episode in 10/22, has not had overt recurrence. She has been considered for Watchman.   - She wants to hold off on Watchman for now, will to consider with a close recurrence of GI bleeding. CBC stable.  8. Thrombus vs vegetation on ICD lead: Afebrile, normal WBCs.  Blood cultures sent and so far NGTD.   Length of Stay: 4  Jordan Lee, NP  03/31/2023, 8:42 AM  Advanced Heart Failure Team Pager [phone] (M-F; 7a - 5p)  Please contact [facility] for night-coverage after hours (5p -7a ) and weekends on [email or web address]   Patient seen with NP, I formulated plan and agree with the above note.  Patient had CRT upgrade yesterday.  Unfortunately, she went back into AF with RVR this morning, HR up to 140s.  SBP stable for her in 100s.   Not lightheaded but feels terrible.   General: NAD Neck: JVP 8 cm, no thyromegaly or thyroid  nodule.  Lungs: Clear to auscultation bilaterally with normal respiratory effort. CV: Nondisplaced PMI.  Heart tachy, irregular S1/S2, no S3/S4, no murmur.  No peripheral edema.  Abdomen: Soft, nontender, no hepatosplenomegaly, no distention.  Skin: Intact without lesions or rashes.  Neurologic: Alert and oriented x 3.  Psych: Normal affect. Extremities: No clubbing or cyanosis.  HEENT: Normal.   Patient is back in AF with RVR despite Tikosyn .  We had hoped to get her to eventual AF ablation given young age, but she is now back in AF/RVR and tolerates poorly.  - I discussed situation with Dr. Cindie, think our best option is going to be to proceed with AV nodal ablation.  Will plan on doing this tomorrow.  - Will give extra dose of Toprol  XL 12.5 x 1 now.  Will not start amiodarone  gtt for rate control given Tikosyn  use.   Suspect mild volume overload this morning, will start back on home Lasix  40 mg po daily for now. Continue spironolactone . Consider losartan  vs Entresto  when rate is stabilized  depending on BP.   S/p CRT-D upgrade, can restart anticoagulation on 2/9 per EP.   Ezra Shuck 03/31/2023 10:19 AM

## 2023-03-31 NOTE — Discharge Summary (Addendum)
 ELECTROPHYSIOLOGY PROCEDURE DISCHARGE SUMMARY    Patient ID: Catherine Mays,  MRN: [id number], DOB/AGE: 58/29/1967 58 y.o.  Admit date: 03/26/2023 Discharge date: 04/02/2023  Primary Care Physician: Shona Norleen PEDLAR, MD  Primary Cardiologist: Lonni Cash, MD  Electrophysiologist: Dr. Cindie    Primary Diagnosis:  Chronic systolic CHF  Persistent AF  Secondary Diagnosis: LBBB  Allergies  Allergen Reactions   Paxil [Paroxetine] Palpitations   Amiodarone  Nausea And Vomiting   Chlorhexidine  Gluconate Itching   Morphine  And Codeine Nausea And Vomiting   Percocet [Oxycodone -Acetaminophen ] Nausea And Vomiting   Cefdinir Nausea Only   Zolpidem  Other (See Comments)    Doesn't work for patient at all     Procedures This Admission:  TEE 03/27/2023 EF 20-25%, Mod LAE, mild RAE, ? Thrombus on Right atrial lead, Mod MR due to annular dilatation, Mod TR 2.  Upgrade of a single chamber ICD to a Abbott BiV ICD on 03/29/2022 by Dr. Cindie.  The patient received a Abbot Gallant [id number] (BiV)  with Abbott Ultipace 1231-52 right atrial lead and chronic RV lead. Pt received a Abbott Quartet 1458Q LV lead. DFTs were deferred at time of implant There were no post procedure complications 3.  CXR on 03/31/2023 demonstrated no pneumothorax status post device implantation. 4. AV nodal ablation 3/5 by Dr. Waddell with resultant CHB     Brief HPI: Catherine Mays is a 58 y.o. female was admitted 1/30 for recurrent symptomatic AF. Had previously planned  PVI in March, but with on-going symptoms was recommended for admission for optimization and consideration of BIV upgrade +/- AV nodal ablation.   Hospital Course:  The patient was admitted as above.  Plans was for TEE/DCC, but pt converted to NSR during TEE. Tikosyn  was continued and pt underwent upgrade to a Abbott BiV ICD with details as outlined above. Given her young age and that she was in NSR at the time, AV nodal ablation was deferred.  She was monitored on telemetry overnight which unfortunately demonstrated recurrent AF with HRS elevated into the 160-170s. She was taken back to the lab on 2/5 for AV nodal ablation, which she tolerated well. Left chest was without hematoma or ecchymosis.  The device was interrogated and found to be functioning normally.  CXR was obtained and demonstrated no pneumothorax status post device implantation..  Wound care, arm mobility, and restrictions were reviewed with the patient.  The patient was examined and considered stable for discharge to home.   With AV nodal ablation, PVI will be deferred while her course is followed closely.   The patient's discharge medications include beta blocker (Toprol ). ACE/ARB/ARNi have been deferred in setting of hypotension.   Anticoagulation resumption This patient should resume their Eliquis  on Sunday, 2/9.  Physical Exam: Vitals:   04/01/23 1250 04/01/23 1317 04/01/23 1734 04/01/23 1946  BP: 90/75 94/81 (!) 89/73 97/75  Pulse: 90 91 90 91  Resp: 14 16 16 18   Temp:  98.6 F (37 C) 97.8 F (36.6 C) 97.8 F (36.6 C)  TempSrc:  Oral Oral Oral  SpO2: 97%  97% 97%  Weight:      Height:        GEN- NAD. A&O x 3.  HEENT: Normocephalic, atraumatic Lungs- CTAB, normal effort.  Heart- RRR. No M/G/R.  GI- Soft, NT, ND.  Extremities- No clubbing, cyanosis, or edema Skin- Warm and dry, no rash or lesion. ICD site stable.   Discharge Medications:  Allergies as of 04/02/2023  Reactions   Paxil [paroxetine] Palpitations   Amiodarone  Nausea And Vomiting   Chlorhexidine  Gluconate Itching   Morphine  And Codeine Nausea And Vomiting   Percocet [oxycodone -acetaminophen ] Nausea And Vomiting   Cefdinir Nausea Only   Zolpidem  Other (See Comments)   Doesn't work for patient at all        Medication List     TAKE these medications    acetaminophen  500 MG tablet Commonly known as: TYLENOL  Take 1,000 mg by mouth every 6 (six) hours as needed for mild  pain.   apixaban  5 MG Tabs tablet Commonly known as: Eliquis  Take 1 tablet (5 mg total) by mouth 2 (two) times daily. Resume Sunday 2/9 Start taking on: April 05, 2023 What changed:  See the new instructions. These instructions start on April 05, 2023. If you are unsure what to do until then, ask your doctor or other care provider.   buPROPion  300 MG 24 hr tablet Commonly known as: WELLBUTRIN  XL Take 300 mg by mouth daily.   CALCIUM  + D3 PO Take 1 tablet by mouth every morning.   CoQ-10 200 MG Caps Take 200 mg by mouth daily.   dexlansoprazole  60 MG capsule Commonly known as: DEXILANT  TAKE 1 CAPSULE(60 MG) BY MOUTH DAILY   dofetilide  250 MCG capsule Commonly known as: TIKOSYN  Take 1 capsule (250 mcg total) by mouth 2 (two) times daily.   ferrous sulfate  325 (65 FE) MG EC tablet Take 325 mg by mouth daily.   fexofenadine 180 MG tablet Commonly known as: ALLEGRA Take 180 mg by mouth daily.   furosemide  20 MG tablet Commonly known as: LASIX  Take 2 tablets as needed in case of weight gain 2 to 3 lbs in 24 hrs or 5 lbs in 7 days until weight back to baseline. What changed:  how much to take how to take this when to take this additional instructions   metoprolol  succinate 25 MG 24 hr tablet Commonly known as: TOPROL -XL Take 1 tablet (25 mg total) by mouth at bedtime.   metoprolol  tartrate 25 MG tablet Commonly known as: LOPRESSOR  Take 0.5-1 tablet by mouth twice a day as needed for HRs over 100   nitroGLYCERIN  0.4 MG SL tablet Commonly known as: NITROSTAT  PLACE 1 TABLET UNDER THE TONGUE EVERY 5 MINUTES AS NEEDED FOR CHEST PAIN   rosuvastatin  20 MG tablet Commonly known as: CRESTOR  Take 1 tablet (20 mg total) by mouth daily.   sertraline  25 MG tablet Commonly known as: ZOLOFT  Take 1 tablet (25 mg total) by mouth daily.   spironolactone  25 MG tablet Commonly known as: ALDACTONE  TAKE 1 TABLET(25 MG) BY MOUTH DAILY   thyroid  120 MG tablet Commonly known  as: ARMOUR Take 120 mg by mouth daily before breakfast.   traZODone  50 MG tablet Commonly known as: DESYREL  Take 25 mg by mouth at bedtime.   triamcinolone  55 MCG/ACT Aero nasal inhaler Commonly known as: NASACORT  Place 2 sprays into the nose daily.        Disposition: Home with usual follow up as in AVS  Duration of Discharge Encounter:  APP time: 30 minutes  Signed, Ozell Prentice Passey, PA-C  04/02/2023 10:07 AM

## 2023-03-31 NOTE — Telephone Encounter (Signed)
Per Dr. Geannie Risen request, pt's Afib Ablation has been moved out from 3/17 to 5/27. Pt is aware and I informed her that I would be in touch with her when it gets closer to time of her procedure.

## 2023-04-01 ENCOUNTER — Encounter (HOSPITAL_COMMUNITY)
Admission: EM | Disposition: A | Discharge status: Home / Self Care | Payer: Self-pay | Source: Home / Self Care | Attending: Internal Medicine

## 2023-04-01 DIAGNOSIS — I4891 Unspecified atrial fibrillation: Secondary | ICD-10-CM | POA: Diagnosis not present

## 2023-04-01 HISTORY — PX: AV NODE ABLATION: EP1193

## 2023-04-01 LAB — BASIC METABOLIC PANEL
Anion gap: 11 (ref 5–15)
BUN: 15 mg/dL (ref 6–20)
CO2: 23 mmol/L (ref 22–32)
Calcium: 8.9 mg/dL (ref 8.9–10.3)
Chloride: 103 mmol/L (ref 98–111)
Creatinine, Ser: 1.09 mg/dL — ABNORMAL HIGH (ref 0.44–1.00)
GFR, Estimated: 59 mL/min — ABNORMAL LOW (ref >60)
Glucose, Bld: 96 mg/dL (ref 70–99)
Potassium: 4.5 mmol/L (ref 3.5–5.1)
Sodium: 137 mmol/L (ref 135–145)

## 2023-04-01 LAB — CULTURE, BLOOD (ROUTINE X 2): Culture: NO GROWTH

## 2023-04-01 LAB — MAGNESIUM: Magnesium: 1.9 mg/dL (ref 1.7–2.4)

## 2023-04-01 SURGERY — AV NODE ABLATION

## 2023-04-01 MED ORDER — ACETAMINOPHEN 325 MG PO TABS
ORAL_TABLET | ORAL | Status: AC
  Filled 2023-04-01: qty 2

## 2023-04-01 MED ORDER — MAGNESIUM SULFATE 2 GM/50ML IV SOLN
2.0000 g | Freq: Once | INTRAVENOUS | Status: AC
  Administered 2023-04-01: 2 g via INTRAVENOUS
  Filled 2023-04-01: qty 50

## 2023-04-01 MED ORDER — BUPIVACAINE HCL (PF) 0.25 % IJ SOLN
INTRAMUSCULAR | Status: AC
  Filled 2023-04-01: qty 30

## 2023-04-01 MED ORDER — SODIUM CHLORIDE 0.9% FLUSH
3.0000 mL | Freq: Two times a day (BID) | INTRAVENOUS | Status: DC
  Administered 2023-04-01 – 2023-04-02 (×3): 3 mL via INTRAVENOUS

## 2023-04-01 MED ORDER — SODIUM CHLORIDE 0.9 % IV SOLN: INTRAVENOUS | Status: DC

## 2023-04-01 MED ORDER — SODIUM CHLORIDE 0.9% FLUSH: 3.0000 mL | INTRAVENOUS | Status: DC | PRN

## 2023-04-01 MED ORDER — MIDAZOLAM HCL 5 MG/5ML IJ SOLN
INTRAMUSCULAR | Status: AC
  Filled 2023-04-01: qty 5

## 2023-04-01 MED ORDER — MIDAZOLAM HCL 5 MG/5ML IJ SOLN
INTRAMUSCULAR | Status: DC | PRN
  Administered 2023-04-01: 2 mg via INTRAVENOUS

## 2023-04-01 MED ORDER — FENTANYL CITRATE (PF) 100 MCG/2ML IJ SOLN
INTRAMUSCULAR | Status: AC
  Filled 2023-04-01: qty 2

## 2023-04-01 MED ORDER — HEPARIN (PORCINE) IN NACL 1000-0.9 UT/500ML-% IV SOLN
INTRAVENOUS | Status: DC | PRN
  Administered 2023-04-01: 500 mL

## 2023-04-01 MED ORDER — SODIUM CHLORIDE 0.9 % IV SOLN: 250.0000 mL | INTRAVENOUS | Status: DC | PRN

## 2023-04-01 MED ORDER — BUPIVACAINE HCL (PF) 0.25 % IJ SOLN
INTRAMUSCULAR | Status: DC | PRN
  Administered 2023-04-01: 30 mL

## 2023-04-01 SURGICAL SUPPLY — 5 items
BAG SNAP BAND KOVER 36X36 (MISCELLANEOUS) IMPLANT
CATH BLAZER 7FR 4MM LG 5031TK2 (ABLATOR) IMPLANT
PACK EP LF (CUSTOM PROCEDURE TRAY) ×1 IMPLANT
PAD DEFIB RADIO PHYSIO CONN (PAD) ×1 IMPLANT
SHEATH PINNACLE 8F 10CM (SHEATH) IMPLANT

## 2023-04-01 NOTE — Progress Notes (Signed)
 Patient ID: Catherine Mays, female   DOB: 13-Jan-1966, 58 y.o.   MRN: [id number]     Advanced Heart Failure Rounding Note  Cardiologist: Lonni Cash, MD  Chief Complaint: Atrial fibrillation  Subjective:    Plan for AV node ablation today.   Remains in AF this morning. Feeling heavy in the chest and fatigued in her lower extremities. SOB with exertion.   Objective:    Weight Range: 69 kg Body mass index is 22.48 kg/m.   Vital Signs:   Temp:  [98 F (36.7 C)-99.2 F (37.3 C)] 98 F (36.7 C) (02/05 0731) Pulse Rate:  [122] 122 (02/05 0731) Resp:  [17-19] 17 (02/05 0731) BP: (91-99)/(55-70) 95/70 (02/05 0731) Last BM Date : 03/30/23  Weight change: Filed Weights   03/26/23 1816 03/27/23 1548  Weight: 69.9 kg 69 kg   Intake/Output:   Intake/Output Summary (Last 24 hours) at 04/01/2023 1056 Last data filed at 04/01/2023 0900 Gross per 24 hour  Intake 0 ml  Output --  Net 0 ml    Physical Exam    General: Well appearing. No distress on RA Cardiac: JVP not visible. S1 and S2 present. No murmurs or rub. Resp: Lung sounds clear and equal B/L Extremities: Warm and dry. No rash, cyanosis.  No peripheral edema.  Neuro: Alert and oriented x3. Affect pleasant. Moves all extremities without difficulty.  Telemetry   AF 100-130 (personally reviewed)  Labs    CBC Recent Labs    03/30/23 0406  WBC 6.4  HGB 10.5*  HCT 32.5*  MCV 91.5  PLT 177   Basic Metabolic Panel Recent Labs    97/95/74 0456 04/01/23 0426  NA 139 137  K 4.8 4.5  CL 106 103  CO2 22 23  GLUCOSE 93 96  BUN 12 15  CREATININE 1.23* 1.09*  CALCIUM  8.7* 8.9  MG 2.0 1.9   Liver Function Tests No results for input(s): AST, ALT, ALKPHOS, BILITOT, PROT, ALBUMIN in the last 72 hours. No results for input(s): LIPASE, AMYLASE in the last 72 hours. Cardiac Enzymes No results for input(s): CKTOTAL, CKMB, CKMBINDEX, TROPONINI in the last 72 hours.  BNP: BNP (last 3  results) Recent Labs    01/17/23 0455 02/09/23 1041 02/11/23 1418  BNP 1,308.6* 483.2* 1,393.2*   ProBNP (last 3 results) No results for input(s): PROBNP in the last 8760 hours.  D-Dimer No results for input(s): DDIMER in the last 72 hours. Hemoglobin A1C No results for input(s): HGBA1C in the last 72 hours. Fasting Lipid Panel No results for input(s): CHOL, HDL, LDLCALC, TRIG, CHOLHDL, LDLDIRECT in the last 72 hours. Thyroid  Function Tests No results for input(s): TSH, T4TOTAL, T3FREE, THYROIDAB in the last 72 hours.  Invalid input(s): FREET3  Other results:  Imaging   No results found.  Medications:    Scheduled Medications:  digoxin   0.125 mg Oral Daily   dofetilide   250 mcg Oral BID   ferrous sulfate   325 mg Oral Daily   metoprolol  succinate  25 mg Oral QHS   rosuvastatin   20 mg Oral Daily   sertraline   25 mg Oral Daily   spironolactone   25 mg Oral Daily   thyroid   120 mg Oral QAC breakfast   traZODone   25 mg Oral QHS    Infusions:  sodium chloride  40 mL/hr at 04/01/23 1043   magnesium  sulfate bolus IVPB       PRN Medications: acetaminophen , ondansetron  (ZOFRAN ) IV  Assessment/Plan   1. Atrial fibrillation: Unable to tolerate amiodarone   due to GI side effects. She failed sotalol . She was started on Tikosyn  12/24, but went back into AF prior to this admission. Converted to NSR during TEE this admission. She tolerates AF very poorly.  - in AF on tele this morning - Continue Tikosyn  250 mcg bid - Continue Toprol -XL 25 mg at bedtime - Prev on Eliquis  on hold for procedure - Plan for AV node ablation today with Dr. Waddell   2. CAD: S/p anterior MI in 7/17 with DES to RCA.  Cath in 10/19 with no obstructive CAD.  No chest pain.  - Holding Eliquis  - She is tolerating Crestor  without myalgias  3. Chronic systolic CHF: Ischemic cardiomyopathy.  EF 25% on echo in 10/17, 30-35% on echo in 11/17, 30% on TEE in 8/18. She has extensive  LAD-territory scar. She has a [employer] ICD.  Echo in 3/23 showed that EF remained 25-30% with LAD-territory scar. Echo 9/24 showed stable EF 25% with LAD territory akinesis, mild LV dilation, mild MR, RV normal, IVC normal. TEE this admission with EF 20-25%, normal RV, moderate MR and TR. Tolerates AF poorly. Has wide LBBB now s/p CRT-D upgrade 2/3. - Euvolemic on exam - Continue spiro 25 mg daily.   - Continue Toprol  XL 25 mg at bedtime. - Hold off on ARB/ARNI with AV node ablation today, reassess BP tomorrow - She did not tolerate dapagliflozin  due to yeast infections   4. Mitral regurgitation: Moderate to severe on 10/17 echo, severe on 11/17 echo.  Underwent TEE in 8/18.  This showed moderate MR that appeared to be functional.  No indication for surgery or Mitraclip. Echo in 3/23 showed mild MR. Echo 9/24 with mild MR. TEE this admission with moderate MR.   5. PVCs: Very frequent on 12/18 holter (41% beats), she has had a PVC ablation in 2/19. Due to recurrence of PVCs post-ablation, she was placed on sotalol . Unfortunately, had break through AF, with PVCs. Now off sotalol  (and started on Tikosyn  for AF). Few PVCs seen on telemetry.  - Continue Toprol  - Continue Tikosyn   6. Hyperlipidemia: Tolerating Crestor . Goal LDL < 55.  Good LDL in 8/24.   7. GI bleeding: With anemia.  She had episode in 10/22, has not had overt recurrence. She has been considered for Watchman.   - She wants to hold off on Watchman for now, will to consider with a close recurrence of GI bleeding. CBC stable.   8. Thrombus vs vegetation on ICD lead: Afebrile, normal WBCs.  Blood cultures sent and so far NGTD.   Length of Stay: 5  Torryn Hudspeth, NP  04/01/2023, 10:56 AM  Advanced Heart Failure Team Pager [phone] (M-F; 7a - 5p)  Please contact [facility] for night-coverage after hours (5p -7a ) and weekends on [email or web address]

## 2023-04-01 NOTE — Interval H&P Note (Signed)
 History and Physical Interval Note:  04/01/2023 10:56 AM  Catherine Mays  has presented today for surgery, with the diagnosis of afib.  The various methods of treatment have been discussed with the patient and family. After consideration of risks, benefits and other options for treatment, the patient has consented to  Procedure(s): AV NODE ABLATION (N/A) as a surgical intervention.  The patient's history has been reviewed, patient examined, no change in status, stable for surgery.  I have reviewed the patient's chart and labs.  Questions were answered to the patient's satisfaction.     Danelle Birmingham

## 2023-04-01 NOTE — Progress Notes (Signed)
 Site area: rt groin venous sheath Site Prior to Removal:  Level 0 Pressure Applied For: 20 minutes Manual:   yes Patient Status During Pull:  stable Post Pull Site:  Level 0 Post Pull Instructions Given:  yes Post Pull Pulses Present: rt pt palpable Dressing Applied:  gauze and tegaderm Bedrest begins @ 1250 Comments:

## 2023-04-01 NOTE — Plan of Care (Signed)
  Problem: Education: Goal: Individualized Educational Video(s) Outcome: Progressing   Problem: Activity: Goal: Ability to tolerate increased activity will improve Outcome: Progressing   Problem: Health Behavior/Discharge Planning: Goal: Ability to manage health-related needs will improve Outcome: Progressing   Problem: Clinical Measurements: Goal: Ability to maintain clinical measurements within normal limits will improve Outcome: Progressing Goal: Will remain free from infection Outcome: Progressing Goal: Diagnostic test results will improve Outcome: Progressing Goal: Respiratory complications will improve Outcome: Progressing Goal: Cardiovascular complication will be avoided Outcome: Progressing   Problem: Coping: Goal: Level of anxiety will decrease Outcome: Progressing   Problem: Pain Managment: Goal: General experience of comfort will improve and/or be controlled Outcome: Progressing   Problem: Safety: Goal: Ability to remain free from injury will improve Outcome: Progressing   Problem: Skin Integrity: Goal: Risk for impaired skin integrity will decrease Outcome: Progressing   Problem: Education: Goal: Knowledge of cardiac device and self-care will improve Outcome: Progressing Goal: Ability to safely manage health related needs after discharge will improve Outcome: Progressing Goal: Individualized Educational Video(s) Outcome: Progressing   Problem: Cardiac: Goal: Ability to achieve and maintain adequate cardiopulmonary perfusion will improve Outcome: Progressing   Problem: Education: Goal: Understanding of disease, treatment, and recovery process will improve Outcome: Progressing   Problem: Activity: Goal: Ability to return to baseline activity level will improve Outcome: Progressing   Problem: Cardiac: Goal: Ability to maintain adequate cardiovascular perfusion will improve Outcome: Progressing Goal: Vascular access site(s) Level 0-1 will be  maintained Outcome: Progressing   Problem: Health Behavior/ Discharge Planning: Goal: Ability to safely manage health related needs after discharge Outcome: Progressing

## 2023-04-01 NOTE — H&P (View-Only) (Signed)
  Patient Name: Catherine Mays Date of Encounter: 04/01/2023  Primary Cardiologist: Lonni Cash, MD Electrophysiologist: OLE ONEIDA HOLTS, MD  Interval Summary   Doing ok this am. Happy for a plan and looking forward to symptom improvement. Felt great when in sinus.   Vital Signs    Vitals:   03/31/23 1956 03/31/23 2335 04/01/23 0514 04/01/23 0731  BP: 99/62 91/66 (!) 91/55 95/70  Pulse:    (!) 122  Resp: 19 18  17   Temp: 99.2 F (37.3 C) 98.6 F (37 C)  98 F (36.7 C)  TempSrc: Oral Oral  Oral  SpO2:      Weight:      Height:       No intake or output data in the 24 hours ending 04/01/23 0904 Filed Weights   03/26/23 1816 03/27/23 1548  Weight: 69.9 kg 69 kg    Physical Exam    GEN- The patient is well appearing, alert and oriented x 3 today.   Lungs- Clear to ausculation bilaterally, normal work of breathing Cardiac- Rapid and  Irregularly irregular rate and rhythm, no murmurs, rubs or gallops GI- soft, NT, ND, + BS Extremities- no clubbing or cyanosis. No edema  Telemetry    AF with RVR 90-160s (personally reviewed)  Hospital Course    Catherine Mays is a 58 y.o. female with a history of prior CAD s/p STEMI 08/2015, HFrEF s/p ICD with malfunction of ICD lead s/p revision 03/2022, atrial fibrillation, mitral regurgitation, HLD, CKD III, PVC's and GIB who was admitted for symptomatic atrial fibrillation. Her atrial fibrillation episodes are accompanied by acute decompensated heart failure in the setting of LBBB. The patient converted to sinus rhythm during TEE prior to DCCV being performed.   Assessment & Plan    Chronic systolic CHF LBBB S/p CRT upgrade 2/3. Wound care and arm restrictions have been reviewed with pt    Persistent AF Continue tikosyn  250 mcg BID Rates remain elevated  Given CHF and difficult to control rates, will proceed with AV nodal ablation today with Dr Waddell.  For questions or updates, please contact [facility] Please  consult [email or web address] for contact info under Cardiology/STEMI.  Signed, Ozell Prentice Passey, PA-C  04/01/2023, 9:04 AM   EP Attending  Patient seen and examined. Agree with above. The patient remains in atrial fib with a RVR. I have discussed with Dr. DELBRA, and we will plan to proceed with AV node ablation due to uncontrolled atrial fib. I have explained the indications/risks/benefits/goals/expectations of the procedure and she wishes to proceed.  Danelle Jibreel Fedewa,MD

## 2023-04-01 NOTE — Plan of Care (Signed)
  Problem: Education: Goal: Individualized Educational Video(s) Outcome: Progressing   Problem: Activity: Goal: Ability to tolerate increased activity will improve Outcome: Progressing   Problem: Health Behavior/Discharge Planning: Goal: Ability to manage health-related needs will improve Outcome: Progressing   Problem: Clinical Measurements: Goal: Ability to maintain clinical measurements within normal limits will improve Outcome: Progressing Goal: Will remain free from infection Outcome: Progressing Goal: Diagnostic test results will improve Outcome: Progressing Goal: Respiratory complications will improve Outcome: Progressing Goal: Cardiovascular complication will be avoided Outcome: Progressing   Problem: Coping: Goal: Level of anxiety will decrease Outcome: Progressing   Problem: Pain Managment: Goal: General experience of comfort will improve and/or be controlled Outcome: Progressing   Problem: Safety: Goal: Ability to remain free from injury will improve Outcome: Progressing   Problem: Skin Integrity: Goal: Risk for impaired skin integrity will decrease Outcome: Progressing   Problem: Education: Goal: Knowledge of cardiac device and self-care will improve Outcome: Progressing Goal: Ability to safely manage health related needs after discharge will improve Outcome: Progressing Goal: Individualized Educational Video(s) Outcome: Progressing   Problem: Cardiac: Goal: Ability to achieve and maintain adequate cardiopulmonary perfusion will improve Outcome: Progressing

## 2023-04-01 NOTE — Progress Notes (Signed)
 PHARMACY - PHYSICIAN COMMUNICATION CRITICAL VALUE ALERT - BLOOD CULTURE IDENTIFICATION (BCID)  Catherine Mays is an 58 y.o. female who presented to [facility] on 03/26/2023 with a chief complaint of palpitations.  Assessment: Blood culture growing GPR in 1 of 4 bottles.  Afebrile, WBC WNL; however, patient has an ICD.  Name of physician (or Provider) Contacted: Dr. Waddell  Current antibiotics: none  Changes to prescribed antibiotics recommended:  Recommendations declined by provider due to clinical status being stable.  No antibiotic needed for now.  [name] D. Lendell, PharmD, BCPS, BCCCP 04/01/2023, 6:14 PM

## 2023-04-01 NOTE — Progress Notes (Addendum)
  Patient Name: Catherine Mays Date of Encounter: 04/01/2023  Primary Cardiologist: Catherine Cash, MD Electrophysiologist: Catherine ONEIDA HOLTS, MD  Interval Summary   Doing ok this am. Happy for a plan and looking forward to symptom improvement. Felt great when in sinus.   Vital Signs    Vitals:   03/31/23 1956 03/31/23 2335 04/01/23 0514 04/01/23 0731  BP: 99/62 91/66 (!) 91/55 95/70  Pulse:    (!) 122  Resp: 19 18  17   Temp: 99.2 F (37.3 C) 98.6 F (37 C)  98 F (36.7 C)  TempSrc: Oral Oral  Oral  SpO2:      Weight:      Height:       No intake or output data in the 24 hours ending 04/01/23 0904 Filed Weights   03/26/23 1816 03/27/23 1548  Weight: 69.9 kg 69 kg    Physical Exam    GEN- The patient is well appearing, alert and oriented x 3 today.   Lungs- Clear to ausculation bilaterally, normal work of breathing Cardiac- Rapid and  Irregularly irregular rate and rhythm, no murmurs, rubs or gallops GI- soft, NT, ND, + BS Extremities- no clubbing or cyanosis. No edema  Telemetry    AF with RVR 90-160s (personally reviewed)  Hospital Course    Catherine Mays is a 58 y.o. female with a history of prior CAD s/p STEMI 08/2015, HFrEF s/p ICD with malfunction of ICD lead s/p revision 03/2022, atrial fibrillation, mitral regurgitation, HLD, CKD III, PVC's and GIB who was admitted for symptomatic atrial fibrillation. Her atrial fibrillation episodes are accompanied by acute decompensated heart failure in the setting of LBBB. The patient converted to sinus rhythm during TEE prior to DCCV being performed.   Assessment & Plan    Chronic systolic CHF LBBB S/p CRT upgrade 2/3. Wound care and arm restrictions have been reviewed with pt    Persistent AF Continue tikosyn  250 mcg BID Rates remain elevated  Given CHF and difficult to control rates, will proceed with AV nodal ablation today with Dr Catherine Mays.  For questions or updates, please contact [facility] Please  consult [email or web address] for contact info under Cardiology/STEMI.  Signed, Catherine Prentice Passey, PA-C  04/01/2023, 9:04 AM   EP Attending  Patient seen and examined. Agree with above. The patient remains in atrial fib with a RVR. I have discussed with Catherine Mays, and we will plan to proceed with AV node ablation due to uncontrolled atrial fib. I have explained the indications/risks/benefits/goals/expectations of the procedure and she wishes to proceed.  Catherine Jibreel Fedewa,MD

## 2023-04-02 ENCOUNTER — Encounter (HOSPITAL_COMMUNITY): Payer: Self-pay | Admitting: Internal Medicine

## 2023-04-02 LAB — BASIC METABOLIC PANEL
Anion gap: 10 (ref 5–15)
BUN: 19 mg/dL (ref 6–20)
CO2: 23 mmol/L (ref 22–32)
Calcium: 8.4 mg/dL — ABNORMAL LOW (ref 8.9–10.3)
Chloride: 102 mmol/L (ref 98–111)
Creatinine, Ser: 1.02 mg/dL — ABNORMAL HIGH (ref 0.44–1.00)
GFR, Estimated: >60 mL/min (ref >60)
Glucose, Bld: 98 mg/dL (ref 70–99)
Potassium: 4.3 mmol/L (ref 3.5–5.1)
Sodium: 135 mmol/L (ref 135–145)

## 2023-04-02 LAB — MAGNESIUM: Magnesium: 2.1 mg/dL (ref 1.7–2.4)

## 2023-04-02 MED ORDER — FUROSEMIDE 40 MG PO TABS
40.0000 mg | ORAL_TABLET | Freq: Every day | ORAL | Status: DC
  Administered 2023-04-02: 40 mg via ORAL
  Filled 2023-04-02: qty 1

## 2023-04-02 MED ORDER — APIXABAN 5 MG PO TABS: 5.0000 mg | ORAL_TABLET | Freq: Two times a day (BID) | ORAL | Status: DC

## 2023-04-02 NOTE — Progress Notes (Signed)
 DISCHARGE NOTE HOME Catherine Mays to be discharged Home per MD order. Discussed prescriptions and follow up appointments with the patient. Prescriptions given to patient; medication list explained in detail. Patient verbalized understanding.  Skin clean, dry and intact without evidence of skin break down, no evidence of skin tears noted. IV catheter discontinued intact. Site without signs and symptoms of complications. Dressing and pressure applied. Pt denies pain at the site currently. No complaints noted.  Patient free of lines, drains, and wounds other than noted on LDA.  An After Visit Summary (AVS) was printed and given to the patient. Patient escorted via wheelchair, and discharged home via private auto.  Peyton SHAUNNA Pepper, RN

## 2023-04-02 NOTE — Progress Notes (Signed)
 Patient ID: Catherine Mays, female   DOB: 1965/07/04, 58 y.o.   MRN: [id number]      Advanced Heart Failure Rounding Note  Cardiologist: Lonni Cash, MD  Chief Complaint: Atrial fibrillation  Subjective:    S/p AV node ablation  Feeling well this morning. Ready to go. No chest heaviness, fatigue or shortness of breath.   Objective:    Weight Range: 69 kg Body mass index is 22.48 kg/m.   Vital Signs:   Temp:  [97.8 F (36.6 C)-98.6 F (37 C)] 97.8 F (36.6 C) (02/05 1946) Pulse Rate:  [0-145] 91 (02/05 1946) Resp:  [12-28] 18 (02/05 1946) BP: (88-106)/(63-81) 97/75 (02/05 1946) SpO2:  [96 %-100 %] 97 % (02/05 1946) Last BM Date : 03/30/23  Weight change: Filed Weights   03/26/23 1816 03/27/23 1548  Weight: 69.9 kg 69 kg   Intake/Output:   Intake/Output Summary (Last 24 hours) at 04/02/2023 0859 Last data filed at 04/02/2023 0835 Gross per 24 hour  Intake 360 ml  Output --  Net 360 ml    Physical Exam    General: Well appearing. No distress on RA Cardiac: JVP not visible. S1 and S2 present. No murmurs or rub. Extremities: Warm and dry. No rash, cyanosis.  No periperial edema.  Neuro: Alert and oriented x3. Affect pleasant. Moves all extremities without difficulty.  Telemetry   Paced at 90 (personally reviewed)  Labs    CBC No results for input(s): WBC, NEUTROABS, HGB, HCT, MCV, PLT in the last 72 hours.  Basic Metabolic Panel Recent Labs    97/94/74 0426 04/02/23 0435  NA 137 135  K 4.5 4.3  CL 103 102  CO2 23 23  GLUCOSE 96 98  BUN 15 19  CREATININE 1.09* 1.02*  CALCIUM  8.9 8.4*  MG 1.9 2.1   Liver Function Tests No results for input(s): AST, ALT, ALKPHOS, BILITOT, PROT, ALBUMIN in the last 72 hours. No results for input(s): LIPASE, AMYLASE in the last 72 hours. Cardiac Enzymes No results for input(s): CKTOTAL, CKMB, CKMBINDEX, TROPONINI in the last 72 hours.  BNP: BNP (last 3 results) Recent  Labs    01/17/23 0455 02/09/23 1041 02/11/23 1418  BNP 1,308.6* 483.2* 1,393.2*   ProBNP (last 3 results) No results for input(s): PROBNP in the last 8760 hours.  D-Dimer No results for input(s): DDIMER in the last 72 hours. Hemoglobin A1C No results for input(s): HGBA1C in the last 72 hours. Fasting Lipid Panel No results for input(s): CHOL, HDL, LDLCALC, TRIG, CHOLHDL, LDLDIRECT in the last 72 hours. Thyroid  Function Tests No results for input(s): TSH, T4TOTAL, T3FREE, THYROIDAB in the last 72 hours.  Invalid input(s): FREET3  Other results:  Imaging   EP STUDY Result Date: 04/01/2023 Conclusion: Successful AV node ablation in a patient with uncontrolled atrial fibrillation status post prior biventricular ICD insertion. Gregg Taylor, MD    Medications:    Scheduled Medications:  digoxin   0.125 mg Oral Daily   dofetilide   250 mcg Oral BID   ferrous sulfate   325 mg Oral Daily   furosemide   40 mg Oral Daily   metoprolol  succinate  25 mg Oral QHS   rosuvastatin   20 mg Oral Daily   sertraline   25 mg Oral Daily   sodium chloride  flush  3 mL Intravenous Q12H   spironolactone   25 mg Oral Daily   thyroid   120 mg Oral QAC breakfast   traZODone   25 mg Oral QHS    Infusions:  sodium chloride   PRN Medications: sodium chloride , acetaminophen , ondansetron  (ZOFRAN ) IV, sodium chloride  flush  Assessment/Plan   1. Atrial fibrillation: Unable to tolerate amiodarone  due to GI side effects. She failed sotalol . She was started on Tikosyn  12/24, but went back into AF prior to this admission. Converted to NSR during TEE this admission. She tolerates AF very poorly.  - s/p AV node ablation with Dr. Waddell 04/01/23 - Tikosyn  per EP - Continue Toprol -XL 25 mg at bedtime - Start Eliquis  in outpatient per EP  2. CAD: S/p anterior MI in 7/17 with DES to RCA.  Cath in 10/19 with no obstructive CAD.  No chest pain.  - Eliquis  per EP - She is tolerating  Crestor  without myalgias  3. Chronic systolic CHF: Ischemic cardiomyopathy.  EF 25% on echo in 10/17, 30-35% on echo in 11/17, 30% on TEE in 8/18. She has extensive LAD-territory scar. She has a [employer] ICD.  Echo in 3/23 showed that EF remained 25-30% with LAD-territory scar. Echo 9/24 showed stable EF 25% with LAD territory akinesis, mild LV dilation, mild MR, RV normal, IVC normal. TEE this admission with EF 20-25%, normal RV, moderate MR and TR. Has wide LBBB now s/p CRT-D upgrade 2/3. - Resume home Lasix  40 mg daily - Continue spiro 25 mg daily.   - Continue Toprol  XL 25 mg at bedtime. - Hold off on ARB/ARNI, consider addition in OP if BP room.  - She did not tolerate dapagliflozin  due to yeast infections   4. Mitral regurgitation: Moderate to severe on 10/17 echo, severe on 11/17 echo.  Underwent TEE in 8/18.  This showed moderate MR that appeared to be functional.  No indication for surgery or Mitraclip. Echo in 3/23 showed mild MR. Echo 9/24 with mild MR. TEE this admission with moderate MR.   5. PVCs: Very frequent on 12/18 holter (41% beats), she has had a PVC ablation in 2/19. Due to recurrence of PVCs post-ablation, she was placed on sotalol . Unfortunately, had break through AF, with PVCs. Now off sotalol  (and started on Tikosyn  for AF). Few PVCs seen on telemetry.  - Continue Toprol   6. Hyperlipidemia: Tolerating Crestor . Goal LDL < 55.  Good LDL in 8/24.   7. GI bleeding: With anemia.  She had episode in 10/22, has not had overt recurrence. She has been considered for Watchman.   - She wants to hold off on Watchman for now, will to consider with a close recurrence of GI bleeding. CBC stable.   8. Thrombus vs vegetation on ICD lead: Afebrile, normal WBCs.  Blood cultures sent and so far NGTD.   Length of Stay: 6  Anjoli Diemer, NP  04/02/2023, 8:59 AM  Advanced Heart Failure Team Pager [phone] (M-F; 7a - 5p)  Please contact [facility] for night-coverage after hours (5p -7a )  and weekends on [email or web address]

## 2023-04-02 NOTE — Plan of Care (Signed)
  Problem: Education: Goal: Individualized Educational Video(s) Outcome: Adequate for Discharge   Problem: Activity: Goal: Ability to tolerate increased activity will improve Outcome: Adequate for Discharge   Problem: Health Behavior/Discharge Planning: Goal: Ability to manage health-related needs will improve Outcome: Adequate for Discharge   Problem: Clinical Measurements: Goal: Ability to maintain clinical measurements within normal limits will improve Outcome: Adequate for Discharge Goal: Will remain free from infection Outcome: Adequate for Discharge Goal: Diagnostic test results will improve Outcome: Adequate for Discharge Goal: Respiratory complications will improve Outcome: Adequate for Discharge Goal: Cardiovascular complication will be avoided Outcome: Adequate for Discharge   Problem: Coping: Goal: Level of anxiety will decrease Outcome: Adequate for Discharge   Problem: Pain Managment: Goal: General experience of comfort will improve and/or be controlled Outcome: Adequate for Discharge   Problem: Safety: Goal: Ability to remain free from injury will improve Outcome: Adequate for Discharge   Problem: Skin Integrity: Goal: Risk for impaired skin integrity will decrease Outcome: Adequate for Discharge   Problem: Education: Goal: Knowledge of cardiac device and self-care will improve Outcome: Adequate for Discharge Goal: Ability to safely manage health related needs after discharge will improve Outcome: Adequate for Discharge Goal: Individualized Educational Video(s) Outcome: Adequate for Discharge   Problem: Cardiac: Goal: Ability to achieve and maintain adequate cardiopulmonary perfusion will improve Outcome: Adequate for Discharge   Problem: Education: Goal: Understanding of disease, treatment, and recovery process will improve Outcome: Adequate for Discharge   Problem: Activity: Goal: Ability to return to baseline activity level will  improve Outcome: Adequate for Discharge   Problem: Cardiac: Goal: Ability to maintain adequate cardiovascular perfusion will improve Outcome: Adequate for Discharge Goal: Vascular access site(s) Level 0-1 will be maintained Outcome: Adequate for Discharge   Problem: Health Behavior/ Discharge Planning: Goal: Ability to safely manage health related needs after discharge Outcome: Adequate for Discharge

## 2023-04-03 LAB — CULTURE, BLOOD (ROUTINE X 2)

## 2023-04-12 ENCOUNTER — Other Ambulatory Visit (HOSPITAL_COMMUNITY): Payer: Self-pay | Admitting: Internal Medicine

## 2023-04-15 ENCOUNTER — Ambulatory Visit: Payer: BC Managed Care – PPO

## 2023-04-15 ENCOUNTER — Other Ambulatory Visit (HOSPITAL_COMMUNITY): Payer: BC Managed Care – PPO

## 2023-04-15 ENCOUNTER — Other Ambulatory Visit (HOSPITAL_COMMUNITY): Payer: Self-pay | Admitting: *Deleted

## 2023-04-15 MED ORDER — METOPROLOL TARTRATE 25 MG PO TABS: ORAL_TABLET | ORAL | 1 refills | Status: DC

## 2023-04-15 NOTE — Progress Notes (Signed)
 Advanced Heart Failure Clinic Note  PCP: Dr. Margo Aye HF Cardiology: Dr. Shirlee Latch  [name] is a 58 y.o. with history of CAD s/p anterior STEMI with DES to LAD, ischemic cardiomyopathy, mitral regurgitation, and paroxysmal atrial fibrillation.  She was admitted in 7/17 with anterior STEMI.  She had a late presentation to the cath lab.  Echo in 10/17 showed EF about 25% with LAD-territory wall motion abnormalities.  She had peri-MI atrial fibrillation and was started on Xarelto.  She was put on amiodarone.   After discharge, she continued to feel poorly.  She developed nausea, anorexia, and significant fatigue.  She was admitted for evaluation in 10/17 given concern for low output heart failure.  However, stopping amiodarone essentially resolved her symptoms.  She had RHC showing preserved cardiac output but the PCWP was high due to prominent v-waves, presumably from significant mitral regurgitation.  Of note, she was in atrial fibrillation for a time during this admission.   She had St Jude ICD placed.  She is back at work for the [employer].   TEE in 8/18 to evaluate the mitral valve showed EF 30%, moderate MR (probably functional).    Patient was seen by Dr. Elberta Fortis and noted to have very frequent PVCs on device interrogation.  Holter in 12/18 showed 41% PVCs.  She was started on sotalol 80 mg bid and titrated up to 120 mg bid.  Coreg was stopped with the addition of sotalol and losartan was decreased to 12.5 mg daily due to low BP.  She had PVC ablation in 2/19 but PVCs recurred. Sotalol was increased to 160 mg bid.   Echo 8/19 showed EF 30% with regional wall motion abnormalities, normal RV size and systolic function, moderate MR.   She was admitted in 10/19 with chest pain.  Cath showed patent LAD stent and no obstructive CAD.  She was found to be anemic with Fe deficiency (hgb down to 8).  FOBT+. She has had IV Fe and blood transfusion.  EGD and colonoscopy this year were  fairly unremarkable.  She had diverticulosis.  Capsule endoscopy negative in 9/21.   Echo in 1/22 showed EF 25-30%, wall motion abnormalities in LAD distribution, mild-moderate MR, normal RV.    Admitted 10/22 with anemia, hemoglobin of 8.6 (baseline around 10-11). She was transfused 1 unit of PRBC on 11/29/2020. GI was consulted and she underwent EGD on 11/30/2020 which showed no source of bleeding.  Capsule endoscopy on 12/01/2020 showed blood in the mid/distal small bowel and proximal colon.  Colonoscopy on 12/02/2020 showed blood in the terminal ileum but no clear source.  It is suspected that the source of bleeding is in the distal small bowel that is not able to be reached via routine colonoscope.  GI asked IR to consider an arteriogram to localize and treat the source of her bleeding. CT done and felt best option was DBE with Dr. Theodore Demark at [facility]. Double balloon endoscopy was done in 12/22, no definite cause of GI bleeding was found.   Echo in 3/23 showed EF 25-30%, apical akinesis, mild MR, normal RV, normal IVC. Echo was done today and reviewed, EF 25% with LAD territory akinesis, mild LV dilation, mild MR, RV normal, IVC normal.   Admitted 11/24 with hypotension and rate controlled AF. + orthostatics. GDMT & sotalol held. Spontaneously converted to NSR. Planned to hold on sotalol and start on Tikosyn inpatient the following week. Planned admission 12/24 for Tikosyn loading. Had waxing/waning PVC burden,  which improved with addition of low dose Toprol. She was discharged home, remained off Entresto with low BP.  Today she returns for HF follow up. Overall feeling fine. She has dyspnea walking up steps but otherwise no issues. She is chronically dizzy, but generally feels better off Entresto. Denies palpitations, abnormal bleeding, CP, edema, or PND/Orthopnea. Appetite ok. No fever or chills. Weight at home 155 pounds. Taking all medications. Planning AF ablation with Dr. Lalla Brothers in 05/11/23.  ECG  (personally reviewed): NSR LBBB, QRS 166 msec, QTc 476 msec  St Jude device interrogation:  1 episode of NSVT on 01/16/23, < 1% VP, volume recently elevated but thoracic impedence now stabilized.  Labs (1/19): K 4.4, creatinine 1.14 Labs (2/19): LDL 97 (off atorvastatin), HDL 56 Labs (5/19): K 4.3, creatinine 1.1 Labs (10/19): K 4.2, creatinine 1.2, LDL 70, hgb 11.8 Labs (11/19): K 4.9, creatinine 1.17, hgb 11.8, LDL 68, low TSH  Labs (9/20): K 4.4, creatinine 1.34, LDL 52 Labs (12/20): K 4.6, creatinine 1.19 Labs (6/21): LDL 73, HDL 62, hgb 11.9, K 4.2, creatinine 1.14 Labs (7/21): hgb 11.2, K 3.8, creatinine 1.12 Labs (10/21): K 4.5, creatinine 1.38, LDL 53, HDL 50 Labs (1/22): K 4.9, creatinine 0.99 Labs (2/22): hgb 11.6, Fe studies normal Labs (5/22): K 5, creatinine 1.05, hgb 11.8 Labs (10/22): K 3.9, creatinine 1.02, hgb 7.1 Labs (11/22): hgb 11.9, K 3.7, creatinine 1.02, BNP 347 Labs (3/23): hgb 13.1 Labs (4/23): K 4.1, creatinine 1.16, pro-BNP 2121 Labs (5/23): LDL 71, TGs 74, K 4.1, creatinine 0.98 Labs (8/23): LDL 62, NT-proBNP 2843, K 3.8, creatinine 1.02 Labs (3/24): LDL 79, TGs 102, K 4.5, creatinine 1.07 Labs (6/24): K 4, creatinine 1.21, BNP 304 Labs (8/24): LDL 55 Labs (12/24): K 4.5, creatinine 1.01  PMH:  1. CAD: Anterior STEMI in 7/17 with late presentation to the cath lab. She had DES to proximal LAD.  No significant disease in other vessels.  - LHC (10/19): Patent LAD stent, no obstructive disease.  2. Chronic systolic CHF: Ischemic cardiomyopathy.  - Echo 10/17 with EF 25%, akinesis of the mid-apical anteroseptal and anterior walls, apical dyskinesis, moderate to severe MR with incomplete coaptation.  - RHC (10/17): mean RA 4, PA 59/17 mean 38, mean PCWP 27 with prominent V waves suggesting significant MR, CI 3.06, PVR 2 WU.  - ACEI cough.  - Echo (11/17): EF 30-35%, normal RV size and systolic function, severe MR with restriction of posterior leaflet.   -  Echo (8/19): EF 30% with regional wall motion abnormalities, normal RV size and systolic function, moderate MR.  - Echo (12/20): EF 30%, LAD wall motion abnormalities, normal RV, PASP 35, moderate functional MR.  - Echo (1/22): EF 25-30%, wall motion abnormalities in LAD distribution, mild-moderate MR, normal RV.  - Echo (3/23): EF 25-30%, apical akinesis, mild MR, normal RV, normal IVC.  - Echo (9/24): EF 25% with LAD territory akinesis, mild LV dilation, mild MR, RV normal, IVC normal.  3. Atrial fibrillation: Paroxysmal.  Noted 7/17 admission peri-MI, also noted again 10/17 admission. She did not tolerate amiodarone due to nausea/anorexia.  - Started on Tikosyn (12/24) 4. Mitral regurgitation: Moderate to severe on 10/17 echo, incomplete coaptation.  ?Etiology, her MI (anterior) does not typically lead to ischemic MR.  - Echo in 11/17 with severe MR, posterior leaflet restricted.  - TEE (8/18): EF 30%, moderately dilated LV with akinetic septal and anterior walls, moderate functional MR, normal RV size and systolic function.  - Echo (8/19): Moderate  MR - Echo (3/23): Mild MR.  - Echo (9/24): Mild MR. 5. Hyperlipidemia: Myalgias with atorvastatin.  6. PVCs: Holter 12/18 with 41% PVCs.  - PVC ablation in 2/19 with recurrence of PVCs.  7. GI bleeding:  - EGD (4/21): unremarkable.  - Colonoscopy (7/21): Diverticulosis.  - Capsule endoscopy (9/21): negative.  - Double balloon endoscopy at [facility] in 12/22 did not show cause for bleeding.   SH: Married, lives in [place], works for the [employer], no smoking or ETOH.   Family History  Problem Relation Age of Onset   Arrhythmia Mother    Lung cancer Mother        bronchiectasis   Heart failure Father    Heart attack Father    Heart attack Brother    Arrhythmia Brother    Colon cancer Neg Hx    Colon polyps Neg Hx    ROS: All systems reviewed and negative except as per HPI.   Current Outpatient Medications on File Prior to Visit  Medication  Sig Dispense Refill   acetaminophen (TYLENOL) 500 MG tablet Take 1,000 mg by mouth every 6 (six) hours as needed for mild pain.     apixaban (ELIQUIS) 5 MG TABS tablet Take 1 tablet (5 mg total) by mouth 2 (two) times daily. Resume Sunday 2/9     buPROPion (WELLBUTRIN XL) 300 MG 24 hr tablet Take 300 mg by mouth daily.     Calcium Carb-Cholecalciferol (CALCIUM + D3 PO) Take 1 tablet by mouth every morning.     Coenzyme Q10 (COQ-10) 200 MG CAPS Take 200 mg by mouth daily.     dexlansoprazole (DEXILANT) 60 MG capsule TAKE 1 CAPSULE(60 MG) BY MOUTH DAILY 90 capsule 3   dofetilide (TIKOSYN) 250 MCG capsule Take 1 capsule (250 mcg total) by mouth 2 (two) times daily. 60 capsule 5   ferrous sulfate 325 (65 FE) MG EC tablet Take 325 mg by mouth daily.     fexofenadine (ALLEGRA) 180 MG tablet Take 180 mg by mouth daily.     furosemide (LASIX) 20 MG tablet Take 2 tablets as needed in case of weight gain 2 to 3 lbs in 24 hrs or 5 lbs in 7 days until weight back to baseline. (Patient taking differently: Take 40 mg by mouth daily.) 90 tablet 3   metoprolol succinate (TOPROL-XL) 25 MG 24 hr tablet Take 1 tablet (25 mg total) by mouth at bedtime. 30 tablet 5   metoprolol tartrate (LOPRESSOR) 25 MG tablet TAKE 1/2 TO 1 TABLET BY MOUTH TWICE DAILY AS NEEDED FOR HEART-RATE OVER 100 60 tablet 1   nitroGLYCERIN (NITROSTAT) 0.4 MG SL tablet PLACE 1 TABLET UNDER THE TONGUE EVERY 5 MINUTES AS NEEDED FOR CHEST PAIN 25 tablet 3   rosuvastatin (CRESTOR) 20 MG tablet Take 1 tablet (20 mg total) by mouth daily. 90 tablet 3   sertraline (ZOLOFT) 25 MG tablet Take 1 tablet (25 mg total) by mouth daily. 30 tablet 0   spironolactone (ALDACTONE) 25 MG tablet TAKE 1 TABLET(25 MG) BY MOUTH DAILY 30 tablet 11   thyroid (ARMOUR) 120 MG tablet Take 120 mg by mouth daily before breakfast.     traZODone (DESYREL) 50 MG tablet Take 25 mg by mouth at bedtime.     triamcinolone (NASACORT) 55 MCG/ACT AERO nasal inhaler Place 2 sprays into  the nose daily.      No current facility-administered medications on file prior to visit.   Wt Readings from Last 3 Encounters:  03/27/23 69  kg (152 lb 3.2 oz)  03/21/23 69.9 kg (154 lb)  03/03/23 69.9 kg (154 lb)   There were no vitals taken for this visit. Physical Exam General:  NAD. No resp difficulty, walked into clinic HEENT: Normal Neck: Supple. No JVD. Carotids 2+ bilat; no bruits. No lymphadenopathy or thryomegaly appreciated. Cor: PMI nondisplaced. Regular rate & rhythm. No rubs, gallops or murmurs. Lungs: Clear Abdomen: Soft, nontender, nondistended. No hepatosplenomegaly. No bruits or masses. Good bowel sounds. Extremities: No cyanosis, clubbing, rash, edema Neuro: Alert & oriented x 3, cranial nerves grossly intact. Moves all 4 extremities w/o difficulty. Affect pleasant.  Assessment/Plan: 1. CAD: S/p anterior MI in 7/17 with DES to RCA.  Cath in 10/19 with no obstructive CAD.  No chest pain.  - She is off Plavix now and taking apixaban 5 mg bid.   - She is tolerating Crestor without myalgias.      2. Chronic systolic CHF: Ischemic cardiomyopathy.  EF 25% on echo in 10/17, 30-35% on echo in 11/17, 30% on TEE in 8/18. She has extensive LAD-territory scar. 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  Echo in 3/23 showed that EF remained 25-30% with LAD-territory scar. Echo 9/24 showed stable EF 25% with LAD territory akinesis, mild LV dilation, mild MR, RV normal, IVC normal.  She is not volume overloaded on exam or by CorVue. Improved NYHA II, dizziness better off Entresto. GDMT limited by soft BP. - Continue Lasix 40 mg daily. BMET/BNP today.  - Continue spironolactone 25 mg daily.   - Continue Toprol XL 25 mg at bedtime. - Off Entresto with hypotension. Consider trial of low-dose losartan in future if BP stable. Will not started today with low BP. - She did not tolerate dapagliflozin due to yeast infections.   - Insurance did not approve her for baroreceptor activation therapy.   - We have  discussed cardiac contractility modulation. However, QRS today is significantly wider at 166 msec with LBBB and she would be a candidate for upgrade of device to CRT.  3. Mitral regurgitation: Moderate to severe on 10/17 echo, severe on 11/17 echo.  Underwent TEE in 8/18.  This showed moderate MR that appeared to be functional.  No indication for surgery or Mitraclip. Echo in 3/23 showed mild MR. Echo 9/24 with mild MR.  4. Atrial fibrillation: Unable to tolerate amiodarone due to GI side effects. She failed sotalol. She was started on Tikosyn 12/24, and remains in NSR today.  - Continue Tikosyn.  QTc interval is acceptable on today's ECG.  Check labs and magnesium today. - Continue Eliquis 5 mg bid. No bleeding issues. CBC today. 5. PVCs: Very frequent on 12/18 holter (41% beats), she has had a PVC ablation in 2/19. Due to recurrence of PVCs post-ablation, she was placed on sotalol. Unfortunately, had break through AF, with PVCs. Now off sotalol (and started on Tikosyn for AF).  - Continue Toprol 6. Hyperlipidemia: Tolerating Crestor. Goal LDL < 55.  Good LDL in 8/24.  7. GI bleeding: With anemia.  She had episode in 10/22, has not had overt recurrence. She has been considered for Watchman.   - She wants to hold off on Watchman for now, will to consider with a close recurrence of GI bleeding. CBC today.  Follow up in 4 months with Dr. Kathreen Cornfield [facility] FNP-BC 04/15/2023

## 2023-04-17 ENCOUNTER — Encounter (HOSPITAL_COMMUNITY): Payer: Self-pay

## 2023-04-17 ENCOUNTER — Ambulatory Visit
Admission: RE | Admit: 2023-04-17 | Discharge: 2023-04-17 | Disposition: A | Discharge status: Home / Self Care | Payer: BC Managed Care – PPO | Source: Ambulatory Visit | Attending: Cardiology | Admitting: Cardiology

## 2023-04-17 ENCOUNTER — Ambulatory Visit (INDEPENDENT_AMBULATORY_CARE_PROVIDER_SITE_OTHER): Payer: BC Managed Care – PPO

## 2023-04-17 VITALS — BP 98/50 | HR 90 | Wt 150.2 lb

## 2023-04-17 DIAGNOSIS — I340 Nonrheumatic mitral (valve) insufficiency: Secondary | ICD-10-CM

## 2023-04-17 DIAGNOSIS — Z7901 Long term (current) use of anticoagulants: Secondary | ICD-10-CM | POA: Insufficient documentation

## 2023-04-17 DIAGNOSIS — I5022 Chronic systolic (congestive) heart failure: Secondary | ICD-10-CM | POA: Diagnosis not present

## 2023-04-17 DIAGNOSIS — I252 Old myocardial infarction: Secondary | ICD-10-CM | POA: Diagnosis not present

## 2023-04-17 DIAGNOSIS — Z95810 Presence of automatic (implantable) cardiac defibrillator: Secondary | ICD-10-CM | POA: Insufficient documentation

## 2023-04-17 DIAGNOSIS — Z79899 Other long term (current) drug therapy: Secondary | ICD-10-CM | POA: Diagnosis not present

## 2023-04-17 DIAGNOSIS — I2510 Atherosclerotic heart disease of native coronary artery without angina pectoris: Secondary | ICD-10-CM

## 2023-04-17 DIAGNOSIS — I255 Ischemic cardiomyopathy: Secondary | ICD-10-CM | POA: Diagnosis not present

## 2023-04-17 DIAGNOSIS — I4819 Other persistent atrial fibrillation: Secondary | ICD-10-CM

## 2023-04-17 DIAGNOSIS — Z8719 Personal history of other diseases of the digestive system: Secondary | ICD-10-CM

## 2023-04-17 DIAGNOSIS — I8290 Acute embolism and thrombosis of unspecified vein: Secondary | ICD-10-CM

## 2023-04-17 DIAGNOSIS — R0602 Shortness of breath: Secondary | ICD-10-CM | POA: Diagnosis present

## 2023-04-17 DIAGNOSIS — R42 Dizziness and giddiness: Secondary | ICD-10-CM | POA: Insufficient documentation

## 2023-04-17 DIAGNOSIS — I493 Ventricular premature depolarization: Secondary | ICD-10-CM

## 2023-04-17 DIAGNOSIS — I447 Left bundle-branch block, unspecified: Secondary | ICD-10-CM | POA: Diagnosis not present

## 2023-04-17 DIAGNOSIS — I480 Paroxysmal atrial fibrillation: Secondary | ICD-10-CM | POA: Insufficient documentation

## 2023-04-17 DIAGNOSIS — E785 Hyperlipidemia, unspecified: Secondary | ICD-10-CM

## 2023-04-17 LAB — BASIC METABOLIC PANEL
Anion gap: 8 (ref 5–15)
BUN: 14 mg/dL (ref 6–20)
CO2: 26 mmol/L (ref 22–32)
Calcium: 8.6 mg/dL — ABNORMAL LOW (ref 8.9–10.3)
Chloride: 104 mmol/L (ref 98–111)
Creatinine, Ser: 1 mg/dL (ref 0.44–1.00)
GFR, Estimated: >60 mL/min (ref >60)
Glucose, Bld: 134 mg/dL — ABNORMAL HIGH (ref 70–99)
Potassium: 3.8 mmol/L (ref 3.5–5.1)
Sodium: 138 mmol/L (ref 135–145)

## 2023-04-17 LAB — CBC
HCT: 34.9 % — ABNORMAL LOW (ref 36.0–46.0)
Hemoglobin: 11.1 g/dL — ABNORMAL LOW (ref 12.0–15.0)
MCH: 28.8 pg (ref 26.0–34.0)
MCHC: 31.8 g/dL (ref 30.0–36.0)
MCV: 90.6 fL (ref 80.0–100.0)
Platelets: 173 10*3/uL (ref 150–400)
RBC: 3.85 MIL/uL — ABNORMAL LOW (ref 3.87–5.11)
RDW: 13.4 % (ref 11.5–15.5)
WBC: 5 10*3/uL (ref 4.0–10.5)
nRBC: 0 % (ref 0.0–0.2)

## 2023-04-17 MED ORDER — NITROGLYCERIN 0.4 MG SL SUBL: 0.4000 mg | SUBLINGUAL_TABLET | SUBLINGUAL | 3 refills | Status: DC | PRN

## 2023-04-17 NOTE — Patient Instructions (Signed)
 There has been no changes to your medications.  Labs done today, your results will be available in MyChart, we will contact you for abnormal readings.  PLEASE CHECK  YOUR BLOOD PRESSURE AT HOME AND BRING YOUR LOG AND BLOOD PRESSURE CUFF TO YOUR NEXT APPOINTMENT.  Your physician recommends that you schedule a follow-up appointment AS SCHEDULED.  If you have any questions or concerns before your next appointment please send [place] a message through [place] or call our office at [phone].    TO LEAVE A MESSAGE FOR THE NURSE SELECT OPTION 2, PLEASE LEAVE A MESSAGE INCLUDING: YOUR NAME DATE OF BIRTH CALL BACK NUMBER REASON FOR CALL**this is important as we prioritize the call backs  YOU WILL RECEIVE A CALL BACK THE SAME DAY AS LONG AS YOU CALL BEFORE 4:00 PM  At the Advanced [facility] Clinic, you and your health needs are our priority. As part of our continuing mission to provide you with exceptional heart care, we have created designated Provider Care Teams. These Care Teams include your primary Cardiologist (physician) and Advanced Practice Providers (APPs- Physician Assistants and Nurse Practitioners) who all work together to provide you with the care you need, when you need it.   You may see any of the following providers on your designated Care Team at your next follow up: Dr Arvilla Meres Dr Marca Ancona Dr. Dorthula Nettles Dr. Clearnce Hasten Amy Filbert Schilder, NP Robbie Lis, [place] [employer] [place], [place] Brynda Peon, NP [place] Lee, NP Clarisa Kindred, NP Karle Plumber, PharmD Enos Fling, PharmD   Please be sure to bring in all your medications bottles to every appointment.    Thank you for choosing [facility]

## 2023-04-17 NOTE — Patient Instructions (Signed)
   After Your Pacemaker   Monitor your pacemaker site for redness, swelling, and drainage. Call the device clinic at [phone] if you experience these symptoms or fever/chills.  Your incision was closed with Steri-strips or staples:  You may shower 7 days after your procedure and wash your incision with soap and water. Avoid lotions, ointments, or perfumes over your incision until it is well-healed.  You may use a hot tub or a pool after your wound check appointment if the incision is completely closed.  Do not lift, push or pull greater than 10 pounds with the affected arm until 6 weeks after your procedure. (05-11-23) There are no other restrictions in arm movement after your wound check appointment.  You may drive, unless driving has been restricted by your healthcare providers.  Remote monitoring is used to monitor your pacemaker from home. This monitoring is scheduled every 91 days by our office. It allows [place] to keep an eye on the functioning of your device to ensure it is working properly. You will routinely see your Electrophysiologist annually (more often if necessary).

## 2023-04-19 LAB — CUP PACEART INCLINIC DEVICE CHECK
Battery Remaining Longevity: 72 mo
Brady Statistic RA Percent Paced: 82 %
Brady Statistic RV Percent Paced: 97 %
Date Time Interrogation Session: 20250221112000
HighPow Impedance: 64.125
Implantable Lead Connection Status: 753985
Implantable Lead Connection Status: 753985
Implantable Lead Connection Status: 753985
Implantable Lead Implant Date: 20240219
Implantable Lead Implant Date: 20250203
Implantable Lead Implant Date: 20250203
Implantable Lead Location: 753858
Implantable Lead Location: 753859
Implantable Lead Location: 753860
Implantable Pulse Generator Implant Date: 20250203
Lead Channel Impedance Value: 1037.5 Ohm
Lead Channel Impedance Value: 450 Ohm
Lead Channel Impedance Value: 487.5 Ohm
Lead Channel Pacing Threshold Amplitude: 0.75 V
Lead Channel Pacing Threshold Amplitude: 0.75 V
Lead Channel Pacing Threshold Amplitude: 0.875 V
Lead Channel Pacing Threshold Amplitude: 1.5 V
Lead Channel Pacing Threshold Amplitude: 1.5 V
Lead Channel Pacing Threshold Pulse Width: 0.3 ms
Lead Channel Pacing Threshold Pulse Width: 0.5 ms
Lead Channel Pacing Threshold Pulse Width: 0.5 ms
Lead Channel Pacing Threshold Pulse Width: 0.7 ms
Lead Channel Pacing Threshold Pulse Width: 0.7 ms
Lead Channel Sensing Intrinsic Amplitude: 12 mV
Lead Channel Sensing Intrinsic Amplitude: 2.1 mV
Lead Channel Setting Pacing Amplitude: 1.875
Lead Channel Setting Pacing Amplitude: 2 V
Lead Channel Setting Pacing Amplitude: 3.5 V
Lead Channel Setting Pacing Pulse Width: 0.3 ms
Lead Channel Setting Pacing Pulse Width: 0.5 ms
Lead Channel Setting Sensing Sensitivity: 0.5 mV
Pulse Gen Serial Number: 211038341
Zone Setting Status: 755011

## 2023-04-19 NOTE — Progress Notes (Signed)
 In-clinic CRT-D check. LV threshold increased and is at high output. Vector changed to 3-4 from 4-3. Device INNACCURATELY logging patient BiV pacing 97% of the time. Compared to histograms its evident that AT MAXIMUM pt is BV pacing 89% and likely lower. Pt enrolled in remote follow-up.  Industry present for troubleshooting and reprogrogramming. Vector changed to M3-P4. Changes made that may have been made that are not reflective in PDFs available include LV-RV timing, PVC BV optimization pacing, and changes to the SAV and PAV delays. All changes reviewed w/ CL by SJM rep.   Pt is post AV node ablation. LRL maintained at 90 in the intermediary d/t significantly increased ventricular ectopy at LRL of 80 vs LRL of 90. Reviewed w/ CL. Will review w/ MD if okay to stay at 90 until 3/7 visit w/ AT.  Wound well healed. CDI. Approximated. No signs of infection or bleeding. Education provided related to remote monitor, incision care, and restrictions.

## 2023-04-21 ENCOUNTER — Encounter: Payer: Self-pay | Admitting: Cardiology

## 2023-04-23 ENCOUNTER — Ambulatory Visit: Payer: BC Managed Care – PPO

## 2023-04-29 ENCOUNTER — Other Ambulatory Visit: Payer: Self-pay | Admitting: Internal Medicine

## 2023-04-29 ENCOUNTER — Ambulatory Visit

## 2023-04-29 DIAGNOSIS — I255 Ischemic cardiomyopathy: Secondary | ICD-10-CM | POA: Diagnosis not present

## 2023-04-30 ENCOUNTER — Encounter (HOSPITAL_BASED_OUTPATIENT_CLINIC_OR_DEPARTMENT_OTHER): Payer: Self-pay

## 2023-04-30 NOTE — Progress Notes (Signed)
  Electrophysiology Office Note:   ID:  Catherine Mays, DOB 1965/05/02, MRN [id number]  Primary Cardiologist: Verne Carrow, MD Electrophysiologist: Lanier Prude, MD      History of Present Illness:   Catherine Mays is a 58 y.o. female with h/o poorly controlled AF, h/o PVCs, chronic systolic CHF, h/o ICD malfunction requiring extraction and re-implant, and CKD seen today for post hospital follow up.    Admitted 03/26/2023 with recurrent HF from AF.   Since discharge from hospital the patient reports doing well overall. Has many questions, but very little symptoms!  Asks about Barostim, which had previously been discussed. Feeling good on Tikosyn and has not had AF symptomatic or by device. Overall, she denies chest pain, palpitations,  PND, orthopnea, nausea, vomiting, dizziness, syncope, edema, weight gain, or early satiety.  She does still have some fatigue and is trying to get back to where she wants to be.   Review of systems complete and found to be negative unless listed in HPI.   EP Information / Studies Reviewed:    EKG is not ordered today. EKG from 04/17/2023 reviewed which showed AV dual paced       ICD Interrogation-  reviewed in detail today,  See PACEART report.  Arrhythmia/Device History Abbott ICD implant 2017 (single chamber) for CHF Lead extraction and re-implant 03/2022 for lead malfunction.  BIV upgrade 03/30/2023 AV nodal ablation 04/01/2023  PVC ablation 03/2017  HF MD--Dr Shirlee Latch   Physical Exam:   VS:  BP 100/66   Pulse 91   Ht 5\' 9"  (1.753 m)   Wt 148 lb 3.2 oz (67.2 kg)   SpO2 98%   BMI 21.89 kg/m    Wt Readings from Last 3 Encounters:  05/01/23 148 lb 3.2 oz (67.2 kg)  04/17/23 150 lb 3.2 oz (68.1 kg)  03/27/23 152 lb 3.2 oz (69 kg)     GEN: No acute distress  NECK: No JVD; No carotid bruits CARDIAC: Regular rate and rhythm, no murmurs, rubs, gallops RESPIRATORY:  Clear to auscultation without rales, wheezing or rhonchi  ABDOMEN: Soft,  non-tender, non-distended EXTREMITIES:  No edema; No deformity   ASSESSMENT AND PLAN:    Chronic systolic CHF  s/p Abbott CRT-D  LBBB euvolemic today Stable on an appropriate medical regimen Normal ICD function See Pace Art report No changes today LRL changed to 80 bpm with R response on for now.   Poorly controlled AF S/p AV nodal ablation 04/01/2023 Continue tikosyn 250 mcg BID. Stable intervals EKG 04/20/23 Will discuss timing of Ablation with Dr. Lalla Brothers, More likely will see her back in 2-3 months with him and decide how best to proceed. She has had multiple cardiac procedures recently, and if Joice Lofts is working can likely continue to [employer] on that for now.   PVCs S/p ablation 2019 Overall well controlled  Disposition:   Follow up with EP APP in 4 weeks   Signed, Graciella Freer, PA-C

## 2023-05-01 ENCOUNTER — Encounter: Payer: Self-pay | Admitting: Student

## 2023-05-01 ENCOUNTER — Ambulatory Visit: Payer: BC Managed Care – PPO | Attending: Student | Admitting: Student

## 2023-05-01 VITALS — BP 100/66 | HR 91 | Ht 69.0 in | Wt 148.2 lb

## 2023-05-01 DIAGNOSIS — I4819 Other persistent atrial fibrillation: Secondary | ICD-10-CM | POA: Diagnosis not present

## 2023-05-01 DIAGNOSIS — I493 Ventricular premature depolarization: Secondary | ICD-10-CM

## 2023-05-01 DIAGNOSIS — I2510 Atherosclerotic heart disease of native coronary artery without angina pectoris: Secondary | ICD-10-CM

## 2023-05-01 DIAGNOSIS — I5022 Chronic systolic (congestive) heart failure: Secondary | ICD-10-CM

## 2023-05-01 LAB — CUP PACEART REMOTE DEVICE CHECK
Battery Remaining Longevity: 68 mo
Battery Remaining Percentage: 95.5 %
Battery Voltage: 3.04 V
Brady Statistic AP VP Percent: 96 %
Brady Statistic AP VS Percent: 1 %
Brady Statistic AS VP Percent: 2.9 %
Brady Statistic AS VS Percent: 1 %
Brady Statistic RA Percent Paced: 97 %
Date Time Interrogation Session: 20250305133507
HighPow Impedance: 74 Ohm
Implantable Lead Connection Status: 753985
Implantable Lead Connection Status: 753985
Implantable Lead Connection Status: 753985
Implantable Lead Implant Date: 20240219
Implantable Lead Implant Date: 20250203
Implantable Lead Implant Date: 20250203
Implantable Lead Location: 753858
Implantable Lead Location: 753859
Implantable Lead Location: 753860
Implantable Pulse Generator Implant Date: 20250203
Lead Channel Impedance Value: 430 Ohm
Lead Channel Impedance Value: 490 Ohm
Lead Channel Impedance Value: 760 Ohm
Lead Channel Pacing Threshold Amplitude: 0.75 V
Lead Channel Pacing Threshold Amplitude: 0.875 V
Lead Channel Pacing Threshold Amplitude: 1.5 V
Lead Channel Pacing Threshold Pulse Width: 0.3 ms
Lead Channel Pacing Threshold Pulse Width: 0.5 ms
Lead Channel Pacing Threshold Pulse Width: 0.7 ms
Lead Channel Sensing Intrinsic Amplitude: 12 mV
Lead Channel Sensing Intrinsic Amplitude: 3.9 mV
Lead Channel Setting Pacing Amplitude: 1.875
Lead Channel Setting Pacing Amplitude: 2 V
Lead Channel Setting Pacing Amplitude: 3.5 V
Lead Channel Setting Pacing Pulse Width: 0.3 ms
Lead Channel Setting Pacing Pulse Width: 0.5 ms
Lead Channel Setting Sensing Sensitivity: 0.5 mV
Pulse Gen Serial Number: 211038341
Zone Setting Status: 755011

## 2023-05-01 LAB — CUP PACEART INCLINIC DEVICE CHECK
Battery Remaining Longevity: 68 mo
Brady Statistic RA Percent Paced: 97 %
Brady Statistic RV Percent Paced: 99.11 %
Date Time Interrogation Session: 20250307115128
HighPow Impedance: 63 Ohm
Implantable Lead Connection Status: 753985
Implantable Lead Connection Status: 753985
Implantable Lead Connection Status: 753985
Implantable Lead Implant Date: 20240219
Implantable Lead Implant Date: 20250203
Implantable Lead Implant Date: 20250203
Implantable Lead Location: 753858
Implantable Lead Location: 753859
Implantable Lead Location: 753860
Implantable Pulse Generator Implant Date: 20250203
Lead Channel Impedance Value: 437.5 Ohm
Lead Channel Impedance Value: 475 Ohm
Lead Channel Impedance Value: 887.5 Ohm
Lead Channel Pacing Threshold Amplitude: 0 V
Lead Channel Pacing Threshold Amplitude: 0.875 V
Lead Channel Pacing Threshold Pulse Width: 0.3 ms
Lead Channel Pacing Threshold Pulse Width: 0.5 ms
Lead Channel Sensing Intrinsic Amplitude: 12 mV
Lead Channel Sensing Intrinsic Amplitude: 4.1 mV
Lead Channel Setting Pacing Amplitude: 1.875
Lead Channel Setting Pacing Amplitude: 2 V
Lead Channel Setting Pacing Amplitude: 3.5 V
Lead Channel Setting Pacing Pulse Width: 0.3 ms
Lead Channel Setting Pacing Pulse Width: 0.5 ms
Lead Channel Setting Sensing Sensitivity: 0.5 mV
Pulse Gen Serial Number: 211038341
Zone Setting Status: 755011

## 2023-05-05 ENCOUNTER — Telehealth: Payer: Self-pay

## 2023-05-05 ENCOUNTER — Encounter: Payer: Self-pay | Admitting: Family Medicine

## 2023-05-05 NOTE — Telephone Encounter (Signed)
 Pt agreeable with plan. CT scan on 5/8 cancelled and AF Ablation on 5/27 cancelled.  She has 1 mth f/u with AT post AV Node and 91 day f/u with CL that she will keep.

## 2023-05-05 NOTE — Telephone Encounter (Signed)
-----   Message from Catherine Mays sent at 05/01/2023 11:01 AM EST ----- Pt is doing well on Tikosyn; She is agreeable with the plan to defer ablation.  OK to cancel CT 5/8 and ablation 5/22 per prior discussion with Dr. Lalla Brothers (Can confirm when you're back on Tuesday before finalizing the change)

## 2023-05-07 ENCOUNTER — Encounter: Payer: Self-pay | Admitting: Cardiology

## 2023-05-08 ENCOUNTER — Other Ambulatory Visit (HOSPITAL_COMMUNITY): Payer: Self-pay | Admitting: Cardiology

## 2023-05-13 ENCOUNTER — Telehealth (HOSPITAL_COMMUNITY): Payer: Self-pay

## 2023-05-13 NOTE — Telephone Encounter (Signed)
 Called to confirm/remind patient of their appointment at the [facility] Clinic on 12/14/23.   Appointment:   [x] Confirmed  [] Left mess   [] No answer/No voice mail  [] Phone not in service  Patient reminded to bring all medications and/or complete list.  Confirmed patient has transportation. Gave directions, instructed to utilize valet parking.

## 2023-05-14 ENCOUNTER — Ambulatory Visit (HOSPITAL_COMMUNITY)
Admission: RE | Admit: 2023-05-14 | Discharge: 2023-05-14 | Disposition: A | Discharge status: Home / Self Care | Payer: BC Managed Care – PPO | Source: Ambulatory Visit | Attending: Cardiology | Admitting: Cardiology

## 2023-05-14 VITALS — BP 98/86 | HR 86 | Wt 149.2 lb

## 2023-05-14 DIAGNOSIS — D500 Iron deficiency anemia secondary to blood loss (chronic): Secondary | ICD-10-CM | POA: Diagnosis not present

## 2023-05-14 DIAGNOSIS — I493 Ventricular premature depolarization: Secondary | ICD-10-CM | POA: Insufficient documentation

## 2023-05-14 DIAGNOSIS — Z79899 Other long term (current) drug therapy: Secondary | ICD-10-CM | POA: Diagnosis not present

## 2023-05-14 DIAGNOSIS — Z7901 Long term (current) use of anticoagulants: Secondary | ICD-10-CM | POA: Diagnosis not present

## 2023-05-14 DIAGNOSIS — Z95810 Presence of automatic (implantable) cardiac defibrillator: Secondary | ICD-10-CM | POA: Diagnosis not present

## 2023-05-14 DIAGNOSIS — I252 Old myocardial infarction: Secondary | ICD-10-CM | POA: Insufficient documentation

## 2023-05-14 DIAGNOSIS — E785 Hyperlipidemia, unspecified: Secondary | ICD-10-CM | POA: Insufficient documentation

## 2023-05-14 DIAGNOSIS — I255 Ischemic cardiomyopathy: Secondary | ICD-10-CM | POA: Diagnosis not present

## 2023-05-14 DIAGNOSIS — I340 Nonrheumatic mitral (valve) insufficiency: Secondary | ICD-10-CM | POA: Insufficient documentation

## 2023-05-14 DIAGNOSIS — Z955 Presence of coronary angioplasty implant and graft: Secondary | ICD-10-CM | POA: Insufficient documentation

## 2023-05-14 DIAGNOSIS — I5022 Chronic systolic (congestive) heart failure: Secondary | ICD-10-CM | POA: Insufficient documentation

## 2023-05-14 DIAGNOSIS — Z8249 Family history of ischemic heart disease and other diseases of the circulatory system: Secondary | ICD-10-CM | POA: Diagnosis not present

## 2023-05-14 DIAGNOSIS — I480 Paroxysmal atrial fibrillation: Secondary | ICD-10-CM | POA: Diagnosis not present

## 2023-05-14 DIAGNOSIS — I2510 Atherosclerotic heart disease of native coronary artery without angina pectoris: Secondary | ICD-10-CM | POA: Insufficient documentation

## 2023-05-14 LAB — BASIC METABOLIC PANEL
Anion gap: 11 (ref 5–15)
BUN: 19 mg/dL (ref 6–20)
CO2: 26 mmol/L (ref 22–32)
Calcium: 9 mg/dL (ref 8.9–10.3)
Chloride: 100 mmol/L (ref 98–111)
Creatinine, Ser: 1.06 mg/dL — ABNORMAL HIGH (ref 0.44–1.00)
GFR, Estimated: >60 mL/min (ref >60)
Glucose, Bld: 104 mg/dL — ABNORMAL HIGH (ref 70–99)
Potassium: 4.1 mmol/L (ref 3.5–5.1)
Sodium: 137 mmol/L (ref 135–145)

## 2023-05-14 LAB — MAGNESIUM: Magnesium: 2 mg/dL (ref 1.7–2.4)

## 2023-05-14 MED ORDER — LOSARTAN POTASSIUM 25 MG PO TABS: 12.5000 mg | ORAL_TABLET | Freq: Every evening | ORAL | 3 refills | Status: DC

## 2023-05-14 NOTE — Patient Instructions (Addendum)
 Thank you for coming in today  If you had labs drawn today, any labs that are abnormal the clinic will call you No news is good news  Medications: Losartan 12.5 mg 1/2 tablet nightly  Follow up appointments:  Your physician recommends that you schedule a follow-up appointment in:  4 weeks Pharmacy  July 2025 With Dr. Shirlee Latch with echocardiogram  Your physician has requested that you have an echocardiogram. Echocardiography is a painless test that uses sound waves to create images of your heart. It provides your doctor with information about the size and shape of your heart and how well your heart's chambers and valves are working. This procedure takes approximately one hour. There are no restrictions for this procedure.      Do the following things EVERYDAY: Weigh yourself in the morning before breakfast. Write it down and keep it in a log. Take your medicines as prescribed Eat low salt foods--Limit salt (sodium) to 2000 mg per day.  Stay as active as you can everyday Limit all fluids for the day to less than 2 liters   At the [facility] Clinic, you and your health needs are our priority. As part of our continuing mission to provide you with exceptional heart care, we have created designated Provider Care Teams. These Care Teams include your primary Cardiologist (physician) and Advanced Practice Providers (APPs- Physician Assistants and Nurse Practitioners) who all work together to provide you with the care you need, when you need it.   You may see any of the following providers on your designated Care Team at your next follow up: Dr Arvilla Meres Dr Marca Ancona Dr. Marcos Eke, NP Robbie Lis, [place] [facility] [place], [place] Brynda Peon, NP Karle Plumber, PharmD   Please be sure to bring in all your medications bottles to every appointment.    Thank you for choosing [facility]  If you have  any questions or concerns before your next appointment please send [place] a message through [place] or call our office at [phone].    TO LEAVE A MESSAGE FOR THE NURSE SELECT OPTION 2, PLEASE LEAVE A MESSAGE INCLUDING: YOUR NAME DATE OF BIRTH CALL BACK NUMBER REASON FOR CALL**this is important as we prioritize the call backs  YOU WILL RECEIVE A CALL BACK THE SAME DAY AS LONG AS YOU CALL BEFORE 4:00 PM

## 2023-05-14 NOTE — Progress Notes (Signed)
 Advanced Heart Failure Clinic Note  PCP: Dr. Margo Aye HF Cardiology: Dr. Shirlee Latch  [name] is a 58 y.o. with history of CAD s/p anterior STEMI with DES to LAD, ischemic cardiomyopathy, mitral regurgitation, and paroxysmal atrial fibrillation.  She was admitted in 7/17 with anterior STEMI.  She had a late presentation to the cath lab.  Echo in 10/17 showed EF about 25% with LAD-territory wall motion abnormalities.  She had peri-MI atrial fibrillation and was started on Xarelto.  She was put on amiodarone.   After discharge, she continued to feel poorly.  She developed nausea, anorexia, and significant fatigue.  She was admitted for evaluation in 10/17 given concern for low output heart failure.  However, stopping amiodarone essentially resolved her symptoms.  She had RHC showing preserved cardiac output but the PCWP was high due to prominent v-waves, presumably from significant mitral regurgitation.  Of note, she was in atrial fibrillation for a time during this admission.   She had St Jude ICD placed.  She is back at work for the [employer].   TEE in 8/18 to evaluate the mitral valve showed EF 30%, moderate MR (probably functional).    Patient was seen by Dr. Elberta Fortis and noted to have very frequent PVCs on device interrogation.  Holter in 12/18 showed 41% PVCs.  She was started on sotalol 80 mg bid and titrated up to 120 mg bid.  Coreg was stopped with the addition of sotalol and losartan was decreased to 12.5 mg daily due to low BP.  She had PVC ablation in 2/19 but PVCs recurred. Sotalol was increased to 160 mg bid.   Echo 8/19 showed EF 30% with regional wall motion abnormalities, normal RV size and systolic function, moderate MR.   She was admitted in 10/19 with chest pain.  Cath showed patent LAD stent and no obstructive CAD.  She was found to be anemic with Fe deficiency (hgb down to 8).  FOBT+. She has had IV Fe and blood transfusion.  EGD and colonoscopy this year were  fairly unremarkable.  She had diverticulosis.  Capsule endoscopy negative in 9/21.   Echo in 1/22 showed EF 25-30%, wall motion abnormalities in LAD distribution, mild-moderate MR, normal RV.    Admitted 10/22 with anemia, hemoglobin of 8.6 (baseline around 10-11). She was transfused 1 unit of PRBC on 11/29/2020. GI was consulted and she underwent EGD on 11/30/2020 which showed no source of bleeding.  Capsule endoscopy on 12/01/2020 showed blood in the mid/distal small bowel and proximal colon.  Colonoscopy on 12/02/2020 showed blood in the terminal ileum but no clear source.  It is suspected that the source of bleeding is in the distal small bowel that is not able to be reached via routine colonoscope.  GI asked IR to consider an arteriogram to localize and treat the source of her bleeding. CT done and felt best option was DBE with Dr. Theodore Demark at [place]. Double balloon endoscopy was done in 12/22, no definite cause of GI bleeding was found.   Echo in 3/23 showed EF 25-30%, apical akinesis, mild MR, normal RV, normal IVC. Echo was done today and reviewed, EF 25% with LAD territory akinesis, mild LV dilation, mild MR, RV normal, IVC normal.   Admitted 11/24 with hypotension and rate controlled AF. + orthostatics. GDMT & sotalol held. Spontaneously converted to NSR. Planned to hold on sotalol and start on Tikosyn inpatient the following week. Planned admission 12/24 for Tikosyn loading. Had waxing/waning PVC burden,  which improved with addition of low dose Toprol. She was discharged home, remained off Entresto with low BP.  Seen in ED 02/11/23 for urgent DCCV, successful restoration of NSR.  Admitted 1/25 with symptomatic Afib with hypotension. Planned TEE/DCCV however she converted to NSR after intubation/coughing.  TEE showed EF 20-25%, normal RV, moderate MR and TR, ? Mobile density on RA lead ? thrombus. Bc negative. Underwent AV node ablation and upgrade to CRT-D. Per EP, plan to let leads mature and arrange  for AF ablation in 6 months.    She presents today for f/u for CHF. Doing well. No complaints. NYHA Class II. No orthopnea/PND or LEE. Denies palpitaitons. No chest pain. No abnormal bleeding. Impedence ok on corvue. No AT/AF. No VT/VF. BiV pacing 99%. Checking BP at home. SPBs stable in the upper 90s and low 100s. Asymptomatic w/o dizziness.   ECG (personally reviewed): AV dual paced 80 bpm, QTC 464 ms   St Jude device interrogation (personally reviewed):  CorVue shows stable impedence. BiV pacing 99%. No AT/AF, No VT/VF  Labs (6/24): K 4, creatinine 1.21, BNP 304 Labs (8/24): LDL 55 Labs (12/24): K 4.5, creatinine 1.01 Labs (2/25): K 4.3, creatinine 1.02  PMH:  1. CAD: Anterior STEMI in 7/17 with late presentation to the cath lab. She had DES to proximal LAD.  No significant disease in other vessels.  - LHC (10/19): Patent LAD stent, no obstructive disease.  2. Chronic systolic CHF: Ischemic cardiomyopathy.  - Echo 10/17 with EF 25%, akinesis of the mid-apical anteroseptal and anterior walls, apical dyskinesis, moderate to severe MR with incomplete coaptation.  - RHC (10/17): mean RA 4, PA 59/17 mean 38, mean PCWP 27 with prominent V waves suggesting significant MR, CI 3.06, PVR 2 WU.  - ACEI cough.  - Echo (11/17): EF 30-35%, normal RV size and systolic function, severe MR with restriction of posterior leaflet.   - Echo (8/19): EF 30% with regional wall motion abnormalities, normal RV size and systolic function, moderate MR.  - Echo (12/20): EF 30%, LAD wall motion abnormalities, normal RV, PASP 35, moderate functional MR.  - Echo (1/22): EF 25-30%, wall motion abnormalities in LAD distribution, mild-moderate MR, normal RV.  - Echo (3/23): EF 25-30%, apical akinesis, mild MR, normal RV, normal IVC.  - Echo (9/24): EF 25% with LAD territory akinesis, mild LV dilation, mild MR, RV normal, IVC normal.  - TEE 1/25: EF 20-25%, Rv normal, mobile density on RA lead/? Thrombus, moderate MR,  moderate TR 3. Atrial fibrillation: Paroxysmal.  Noted 7/17 admission peri-MI, also noted again 10/17 admission. She did not tolerate amiodarone due to nausea/anorexia.  - Started on Tikosyn (12/24) - DCCV 12/24 to NSR - s/p AVN ablation 2/25 4. Mitral regurgitation: Moderate to severe on 10/17 echo, incomplete coaptation.  ?Etiology, her MI (anterior) does not typically lead to ischemic MR.  - Echo in 11/17 with severe MR, posterior leaflet restricted.  - TEE (8/18): EF 30%, moderately dilated LV with akinetic septal and anterior walls, moderate functional MR, normal RV size and systolic function.  - Echo (8/19): Moderate MR - Echo (3/23): Mild MR.  - Echo (9/24): Mild MR. - TEE (1/25): moderate MR 5. Hyperlipidemia: Myalgias with atorvastatin.  6. PVCs: Holter 12/18 with 41% PVCs.  - PVC ablation in 2/19 with recurrence of PVCs.  7. GI bleeding:  - EGD (4/21): unremarkable.  - Colonoscopy (7/21): Diverticulosis.  - Capsule endoscopy (9/21): negative.  - Double balloon endoscopy at [facility] in 12/22  did not show cause for bleeding.   SH: Married, lives in [place], works for the [employer], no smoking or ETOH.   Family History  Problem Relation Age of Onset   Arrhythmia Mother    Lung cancer Mother        bronchiectasis   Heart failure Father    Heart attack Father    Heart attack Brother    Arrhythmia Brother    Colon cancer Neg Hx    Colon polyps Neg Hx    ROS: All systems reviewed and negative except as per HPI.   Current Outpatient Medications on File Prior to Encounter  Medication Sig Dispense Refill   acetaminophen (TYLENOL) 500 MG tablet Take 1,000 mg by mouth every 6 (six) hours as needed for mild pain.     apixaban (ELIQUIS) 5 MG TABS tablet Take 1 tablet (5 mg total) by mouth 2 (two) times daily. Resume Sunday 2/9     buPROPion (WELLBUTRIN XL) 300 MG 24 hr tablet Take 300 mg by mouth daily.     Calcium Carb-Cholecalciferol (CALCIUM + D3 PO) Take 1 tablet by mouth every  morning.     Coenzyme Q10 (COQ-10) 200 MG CAPS Take 200 mg by mouth daily.     dexlansoprazole (DEXILANT) 60 MG capsule TAKE 1 CAPSULE(60 MG) BY MOUTH DAILY 90 capsule 3   dofetilide (TIKOSYN) 250 MCG capsule Take 1 capsule (250 mcg total) by mouth 2 (two) times daily. 60 capsule 5   ferrous sulfate 325 (65 FE) MG EC tablet Take 325 mg by mouth daily.     fexofenadine (ALLEGRA) 180 MG tablet Take 180 mg by mouth daily.     furosemide (LASIX) 20 MG tablet Take 2 tablets as needed in case of weight gain 2 to 3 lbs in 24 hrs or 5 lbs in 7 days until weight back to baseline. (Patient taking differently: 40 mg daily.) 90 tablet 3   metoprolol succinate (TOPROL-XL) 25 MG 24 hr tablet Take 1 tablet (25 mg total) by mouth at bedtime. 30 tablet 5   nitroGLYCERIN (NITROSTAT) 0.4 MG SL tablet Place 1 tablet (0.4 mg total) under the tongue every 5 (five) minutes as needed for chest pain. 25 tablet 3   rosuvastatin (CRESTOR) 20 MG tablet Take 1 tablet (20 mg total) by mouth daily. 90 tablet 3   sertraline (ZOLOFT) 50 MG tablet Take 1 tablet by mouth daily.     spironolactone (ALDACTONE) 25 MG tablet TAKE 1 TABLET(25 MG) BY MOUTH DAILY 30 tablet 11   SYNTHROID 125 MCG tablet Take 112 mcg by mouth every morning.     traZODone (DESYREL) 50 MG tablet Take 25 mg by mouth at bedtime.     triamcinolone (NASACORT) 55 MCG/ACT AERO nasal inhaler Place 2 sprays into the nose daily.      No current facility-administered medications on file prior to encounter.   Wt Readings from Last 3 Encounters:  05/14/23 67.7 kg (149 lb 3.2 oz)  05/01/23 67.2 kg (148 lb 3.2 oz)  04/17/23 68.1 kg (150 lb 3.2 oz)   BP 98/86   Pulse 86   Wt 67.7 kg (149 lb 3.2 oz)   SpO2 99%   BMI 22.03 kg/m   PHYSICAL EXAM: General:  Well appearing. No respiratory difficulty HEENT: normal Neck: supple. no JVD. Carotids 2+ bilat; no bruits. No lymphadenopathy or thyromegaly appreciated. Cor: PMI nondisplaced. Regular rate & rhythm. No rubs,  gallops or murmurs. Lungs: clear Abdomen: soft, nontender, nondistended. No hepatosplenomegaly. No  bruits or masses. Good bowel sounds. Extremities: no cyanosis, clubbing, rash, edema Neuro: alert & oriented x 3, cranial nerves grossly intact. moves all 4 extremities w/o difficulty. Affect pleasant.   Assessment/Plan: 1. Atrial fibrillation: Unable to tolerate amiodarone due to GI side effects. She failed sotalol. Required Emergenct  DCCV x 1 12/24. Now on Tikosyn.A-V dual paced on EKG w/ stable QT interval, 464 ms. Device interrogation shows no AF/AT detection.   - continue Tikosyn per EP. QT ok on EKG today Check BMP and Mg level today  - Continue Toprol-XL 25 mg at bedtime - Continue Eliquis 5 mg bid. She denies any bleeding issues  2. CAD: S/p anterior MI in 7/17 with DES to RCA.  Cath in 10/19 with no obstructive CAD.   - stable w/o CP - no ASA given need for Eliquis and h/o GIB  - She is tolerating Crestor without myalgias. Obtain LP next visit  3. Chronic systolic CHF: Ischemic cardiomyopathy.  EF 25% on echo in 10/17, 30-35% on echo in 11/17, 30% on TEE in 8/18. She has extensive LAD-territory scar. 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  Echo in 3/23 showed that EF remained 25-30% with LAD-territory scar. Echo 9/24 showed stable EF 25% with LAD territory akinesis, mild LV dilation, mild MR, RV normal, IVC normal. TEE (1/25) with EF 20-25%, normal RV, moderate MR and TR. With wide LBBB, now s/p CRT-D upgrade. Stable NYHA Class II symptoms. Euvolemic on exam and on CorVue. BP chronically soft but asymptomatic w/ dizziness.  - Continue Lasix 40 mg daily  - Continue spiro 25 mg daily.  - Continue Toprol XL 25 mg at bedtime. - Start Losartan 12.5 mg at bedtime. If able to tolerate, can consider switch to low dose Entresto and changing Lasix to PRN next visit (currently taking daily)  - She did not tolerate dapagliflozin due to yeast infections  - Repeat echo in 2-3 months, post CRT-D upgrade to look for  any EF improvement. If non-responder and remains symptomatic, may consider Barostim. This may also allow for more BP room to be more aggressive w/ GDMT  4. Mitral regurgitation: Moderate to severe on 10/17 echo, severe on 11/17 echo.  Underwent TEE in 8/18.  This showed moderate MR that appeared to be functional.  No indication for surgery or Mitraclip. Echo in 3/23 showed mild MR. Echo 9/24 with mild MR. TEE this admission (1/25) with moderate MR.  5. PVCs: Very frequent on 12/18 holter (41% beats), she has had a PVC ablation in 2/19. Due to recurrence of PVCs post-ablation, she was placed on sotalol. Unfortunately, had break through AF, with PVCs. Now off sotalol (and started on Tikosyn for AF). Asymptomatic. No burden on device interrogation today. - Continue Toprol. 6. Hyperlipidemia: Tolerating Crestor. Goal LDL < 55.  Good LDL in 8/24.  - check LP next visit  7. GI bleeding: With anemia.  She had episode in 10/22, has not had overt recurrence. She has been considered for Watchman.   - She wants to hold off on Watchman for now, will to consider with a close recurrence of GI bleeding.  8. Thrombus vs vegetation on ICD lead: w/u showed negative blood cx.  - No change.  Follow up in 3-4 weeks with PharmD (consider transition to [facility] if able) and 3 months with Dr. Shirlee Latch w/ Echo    Robbie Lis PA-C  05/14/2023

## 2023-05-26 LAB — LAB REPORT - SCANNED
A1c: 6.1
EGFR: 55

## 2023-05-29 ENCOUNTER — Telehealth (HOSPITAL_COMMUNITY): Payer: Self-pay | Admitting: Cardiology

## 2023-05-29 NOTE — Telephone Encounter (Signed)
 Patient called to report Weakness and SOB with starting losartan  Reports she has felt great since having AV node ablation. Reports a week or so after procedure started losartan and noticed symptoms.  Pt reports she has stopped losartan 4/2. Has not noticed improvement   Weight 149 today normally 147 4/3 88/70 HR 90 4/4 92/80 at PCP Denies swelling Reports leg weakness and increased fatigue with exertion  Follow up scheduled with Dr Lalla Brothers 4/18  Just wanted to inform HF team in the event changes are needed on our end

## 2023-05-31 ENCOUNTER — Emergency Department (HOSPITAL_COMMUNITY)
Admission: EM | Admit: 2023-05-31 | Discharge: 2023-05-31 | Disposition: A | Discharge status: Home / Self Care | Attending: Emergency Medicine | Admitting: Emergency Medicine

## 2023-05-31 ENCOUNTER — Emergency Department (HOSPITAL_COMMUNITY)

## 2023-05-31 ENCOUNTER — Other Ambulatory Visit: Payer: Self-pay

## 2023-05-31 ENCOUNTER — Encounter (HOSPITAL_COMMUNITY): Payer: Self-pay

## 2023-05-31 DIAGNOSIS — R0602 Shortness of breath: Secondary | ICD-10-CM | POA: Diagnosis present

## 2023-05-31 DIAGNOSIS — Z7901 Long term (current) use of anticoagulants: Secondary | ICD-10-CM | POA: Diagnosis not present

## 2023-05-31 DIAGNOSIS — R0609 Other forms of dyspnea: Secondary | ICD-10-CM | POA: Insufficient documentation

## 2023-05-31 LAB — BASIC METABOLIC PANEL WITH GFR
Anion gap: 10 (ref 5–15)
BUN: 26 mg/dL — ABNORMAL HIGH (ref 6–20)
CO2: 22 mmol/L (ref 22–32)
Calcium: 8.9 mg/dL (ref 8.9–10.3)
Chloride: 105 mmol/L (ref 98–111)
Creatinine, Ser: 1.33 mg/dL — ABNORMAL HIGH (ref 0.44–1.00)
GFR, Estimated: 47 mL/min — ABNORMAL LOW (ref >60)
Glucose, Bld: 132 mg/dL — ABNORMAL HIGH (ref 70–99)
Potassium: 4.2 mmol/L (ref 3.5–5.1)
Sodium: 137 mmol/L (ref 135–145)

## 2023-05-31 LAB — CBC
HCT: 37.2 % (ref 36.0–46.0)
Hemoglobin: 11.8 g/dL — ABNORMAL LOW (ref 12.0–15.0)
MCH: 28.2 pg (ref 26.0–34.0)
MCHC: 31.7 g/dL (ref 30.0–36.0)
MCV: 88.8 fL (ref 80.0–100.0)
Platelets: 198 10*3/uL (ref 150–400)
RBC: 4.19 MIL/uL (ref 3.87–5.11)
RDW: 14.2 % (ref 11.5–15.5)
WBC: 9.4 10*3/uL (ref 4.0–10.5)
nRBC: 0 % (ref 0.0–0.2)

## 2023-05-31 LAB — RESP PANEL BY RT-PCR (RSV, FLU A&B, COVID)  RVPGX2
Influenza A by PCR: NEGATIVE
Influenza B by PCR: NEGATIVE
Resp Syncytial Virus by PCR: NEGATIVE
SARS Coronavirus 2 by RT PCR: NEGATIVE

## 2023-05-31 LAB — BRAIN NATRIURETIC PEPTIDE: B Natriuretic Peptide: 995.2 pg/mL — ABNORMAL HIGH (ref 0.0–100.0)

## 2023-05-31 LAB — TROPONIN I (HIGH SENSITIVITY)
Troponin I (High Sensitivity): 9 ng/L (ref <18)
Troponin I (High Sensitivity): 9 ng/L (ref <18)

## 2023-05-31 LAB — TSH: TSH: 0.135 u[IU]/mL — ABNORMAL LOW (ref 0.350–4.500)

## 2023-05-31 LAB — MAGNESIUM: Magnesium: 1.9 mg/dL (ref 1.7–2.4)

## 2023-05-31 LAB — T4, FREE: Free T4: 1.99 ng/dL — ABNORMAL HIGH (ref 0.61–1.12)

## 2023-05-31 MED ORDER — FUROSEMIDE 10 MG/ML IJ SOLN
40.0000 mg | Freq: Once | INTRAMUSCULAR | Status: AC
  Administered 2023-05-31: 40 mg via INTRAVENOUS
  Filled 2023-05-31: qty 4

## 2023-05-31 NOTE — Discharge Instructions (Signed)
 On your tests here it looks like you might have a little too much fluid on you.  Please follow up with your cardiologist in the office.

## 2023-05-31 NOTE — ED Provider Notes (Signed)
[facility] AT [facility] Provider Note   CSN: [id number] Arrival date & time: 05/31/23  0946     History  Chief Complaint  Patient presents with   Weakness    Catherine Mays is a 58 y.o. female.  58 yo F with a chief complaints of feeling fatigued.  She tells me this been going on for about a week or so now.  She recently had a new planted cardiac device that was supposed to help her with her A-fib and then had an ablation shortly thereafter.  She was started on losartan.  Denies any other new medications.  Has been coughing a little bit.  No known sick contacts.  No significant lower extremity edema.  Cough seems worse when she lays back flat.  She denies fevers or chills.  Denies chest pain.  Is short of breath especially upon exertion.   Weakness      Home Medications Prior to Admission medications   Medication Sig Start Date End Date Taking? Authorizing Provider  acetaminophen (TYLENOL) 500 MG tablet Take 1,000 mg by mouth every 6 (six) hours as needed for mild pain.    [provider]  apixaban (ELIQUIS) 5 MG TABS tablet Take 1 tablet (5 mg total) by mouth 2 (two) times daily. Resume Sunday 2/9 04/05/23   Graciella Freer, PA-C  buPROPion (WELLBUTRIN XL) 300 MG 24 hr tablet Take 300 mg by mouth daily. 11/07/20   [provider]  Calcium Carb-Cholecalciferol (CALCIUM + D3 PO) Take 1 tablet by mouth every morning.    [provider]  Coenzyme Q10 (COQ-10) 200 MG CAPS Take 200 mg by mouth daily.    [provider]  dexlansoprazole (DEXILANT) 60 MG capsule TAKE 1 CAPSULE(60 MG) BY MOUTH DAILY 04/29/23   [name], MD  dofetilide (TIKOSYN) 250 MCG capsule Take 1 capsule (250 mcg total) by mouth 2 (two) times daily. 02/06/23   Sheilah Pigeon, PA-C  ferrous sulfate 325 (65 FE) MG EC tablet Take 325 mg by mouth daily.    [provider]  fexofenadine (ALLEGRA) 180 MG tablet Take 180 mg by mouth  daily.    [provider]  furosemide (LASIX) 20 MG tablet Take 2 tablets as needed in case of weight gain 2 to 3 lbs in 24 hrs or 5 lbs in 7 days until weight back to baseline. Patient taking differently: 40 mg daily. 01/19/23   Arrien, York Ram, MD  losartan (COZAAR) 25 MG tablet Take 0.5 tablets (12.5 mg total) by mouth at bedtime. 05/14/23 08/12/23  Robbie Lis M, PA-C  metoprolol succinate (TOPROL-XL) 25 MG 24 hr tablet Take 1 tablet (25 mg total) by mouth at bedtime. 02/06/23   Sheilah Pigeon, PA-C  nitroGLYCERIN (NITROSTAT) 0.4 MG SL tablet Place 1 tablet (0.4 mg total) under the tongue every 5 (five) minutes as needed for chest pain. 04/17/23   [name], MD  rosuvastatin (CRESTOR) 20 MG tablet Take 1 tablet (20 mg total) by mouth daily. 08/27/22   [name], MD  sertraline (ZOLOFT) 50 MG tablet Take 1 tablet by mouth daily.    [provider]  spironolactone (ALDACTONE) 25 MG tablet TAKE 1 TABLET(25 MG) BY MOUTH DAILY 05/08/23   [name], MD  SYNTHROID 125 MCG tablet Take 112 mcg by mouth every morning. 04/20/23   [provider]  traZODone (DESYREL) 50 MG tablet Take 25 mg by mouth at bedtime.  [provider]  triamcinolone (NASACORT) 55 MCG/ACT AERO nasal inhaler Place 2 sprays into the nose daily.     [provider]      Allergies    Paxil [paroxetine], Amiodarone, Chlorhexidine gluconate, Morphine and codeine, Percocet [oxycodone-acetaminophen], Cefdinir, and Zolpidem    Review of Systems   Review of Systems  Neurological:  Positive for weakness.    Physical Exam Updated Vital Signs BP 100/65   Pulse 83   Temp 98.4 F (36.9 C) (Oral)   Resp 18   Ht 5\' 9"  (1.753 m)   Wt 67.6 kg   SpO2 96%   BMI 22.00 kg/m  Physical Exam Vitals and nursing note reviewed.  Constitutional:      General: She is not in acute distress.    Appearance: She is well-developed. She is not diaphoretic.  HENT:      Head: Normocephalic and atraumatic.  Eyes:     Pupils: Pupils are equal, round, and reactive to light.  Cardiovascular:     Rate and Rhythm: Normal rate and regular rhythm.     Heart sounds: No murmur heard.    No friction rub. No gallop.  Pulmonary:     Effort: Pulmonary effort is normal.     Breath sounds: No wheezing or rales.  Abdominal:     General: There is no distension.     Palpations: Abdomen is soft.     Tenderness: There is no abdominal tenderness.  Musculoskeletal:        General: No tenderness.     Cervical back: Normal range of motion and neck supple.  Skin:    General: Skin is warm and dry.  Neurological:     Mental Status: She is alert and oriented to person, place, and time.  Psychiatric:        Behavior: Behavior normal.     ED Results / Procedures / Treatments   Labs (all labs ordered are listed, but only abnormal results are displayed) Labs Reviewed  BASIC METABOLIC PANEL WITH GFR - Abnormal; Notable for the following components:      Result Value   Glucose, Bld 132 (*)    BUN 26 (*)    Creatinine, Ser 1.33 (*)    GFR, Estimated 47 (*)    All other components within normal limits  CBC - Abnormal; Notable for the following components:   Hemoglobin 11.8 (*)    All other components within normal limits  BRAIN NATRIURETIC PEPTIDE - Abnormal; Notable for the following components:   B Natriuretic Peptide 995.2 (*)    All other components within normal limits  TSH - Abnormal; Notable for the following components:   TSH 0.135 (*)    All other components within normal limits  T4, FREE - Abnormal; Notable for the following components:   Free T4 1.99 (*)    All other components within normal limits  RESP PANEL BY RT-PCR (RSV, FLU A&B, COVID)  RVPGX2  MAGNESIUM  TROPONIN I (HIGH SENSITIVITY)  TROPONIN I (HIGH SENSITIVITY)    EKG EKG Interpretation Date/Time:  Sunday May 31 2023 09:54:41 EDT Ventricular Rate:  80 PR Interval:  176 QRS  Duration:  152 QT Interval:  480 QTC Calculation: 553 R Axis:   195  Text Interpretation: AV dual-paced rhythm Abnormal ECG No significant change since last tracing Confirmed by Melene Plan [phone]) on 05/31/2023 11:17:07 AM  Radiology DG Chest 2 View Result Date: 05/31/2023 CLINICAL DATA:  Weakness and chest pain. EXAM: CHEST -  2 VIEW COMPARISON:  03/31/2023. FINDINGS: Heart is enlarged and the mediastinal contour is within normal limits. There is atherosclerotic calcification of the aorta. The pulmonary vasculature is mildly distended. Mild airspace disease is present in the infrahilar regions on the right. There are trace bilateral pleural effusions. No pneumothorax is seen. Degenerative changes are noted in the thoracic spine. A multi lead pacemaker device is present over the left chest. IMPRESSION: 1. Cardiomegaly with pulmonary vascular congestion. 2. Mild airspace disease at the lung bases, possible edema or infiltrate. 3. Trace bilateral pleural effusions. Electronically Signed   By: Thornell Sartorius M.D.   On: 05/31/2023 12:32    Procedures Procedures    Medications Ordered in ED Medications  furosemide (LASIX) injection 40 mg (40 mg Intravenous Given 05/31/23 1336)    ED Course/ Medical Decision Making/ A&P                                 Medical Decision Making Amount and/or Complexity of Data Reviewed Labs: ordered. Radiology: ordered.  Risk Prescription drug management.   58 yo F with a chief complaints of fatigue has been going on for the past week.  She is recently status post ablation.  She is worried that if she still in A-fib.  She was most recently when she was checked she was not in A-fib.  She is planning on calling her cardiologist in the morning but because she was having worsening symptoms decided come to the ED for evaluation.  Plain film of the chest independently interpreted by me without obvious change from last check.  Radiology read with possible small  bilateral pleural effusions.  BNP is mildly up from last check.  No acute anemia no significant electrolyte abnormalities.  Troponins negative x 2.  COVID flu and RSV are negative.  Will give a bolus dose of Lasix here.  Encouraged her to call her cardiologist on the phone tomorrow.  1:39 PM:  I have discussed the diagnosis/risks/treatment options with the patient and family.  Evaluation and diagnostic testing in the emergency department does not suggest an emergent condition requiring admission or immediate intervention beyond what has been performed at this time.  They will follow up with PCP. We also discussed returning to the ED immediately if new or worsening sx occur. We discussed the sx which are most concerning (e.g., sudden worsening pain, fever, inability to tolerate by mouth) that necessitate immediate return. Medications administered to the patient during their visit and any new prescriptions provided to the patient are listed below.  Medications given during this visit Medications  furosemide (LASIX) injection 40 mg (40 mg Intravenous Given 05/31/23 1336)     The patient appears reasonably screen and/or stabilized for discharge and I doubt any other medical condition or other [facility] requiring further screening, evaluation, or treatment in the ED at this time prior to discharge.          Final Clinical Impression(s) / ED Diagnoses Final diagnoses:  SOBOE (shortness of breath on exertion)    Rx / DC Orders ED Discharge Orders          Ordered    Ambulatory referral to Cardiology       Comments: If you have not heard from the Cardiology office within the next 72 hours please call [phone].   05/31/23 1337              Melene Plan, DO 05/31/23  1339  

## 2023-05-31 NOTE — ED Triage Notes (Signed)
 Patient reports hx of afib and had ablation in feb and was doing good and now starting to have shortness of breath, lower extremity weakness fatigue and pain in chest back and neck.

## 2023-06-01 NOTE — Telephone Encounter (Signed)
Pt aware.

## 2023-06-01 NOTE — Addendum Note (Signed)
 Addended by: Athalie Newhard, Milagros Reap on: 06/01/2023 03:41 PM   Modules accepted: Orders

## 2023-06-02 ENCOUNTER — Ambulatory Visit: Attending: Pulmonary Disease | Admitting: Pulmonary Disease

## 2023-06-02 ENCOUNTER — Encounter: Payer: Self-pay | Admitting: Pulmonary Disease

## 2023-06-02 VITALS — BP 102/72 | HR 90 | Ht 69.0 in | Wt 149.2 lb

## 2023-06-02 DIAGNOSIS — I959 Hypotension, unspecified: Secondary | ICD-10-CM

## 2023-06-02 DIAGNOSIS — Z9889 Other specified postprocedural states: Secondary | ICD-10-CM | POA: Diagnosis not present

## 2023-06-02 DIAGNOSIS — M791 Myalgia, unspecified site: Secondary | ICD-10-CM | POA: Diagnosis not present

## 2023-06-02 DIAGNOSIS — Z79899 Other long term (current) drug therapy: Secondary | ICD-10-CM

## 2023-06-02 LAB — CUP PACEART INCLINIC DEVICE CHECK
Battery Remaining Longevity: 81 mo
Brady Statistic RA Percent Paced: 67 %
Brady Statistic RV Percent Paced: 99.56 %
Date Time Interrogation Session: 20250408191704
HighPow Impedance: 72 Ohm
Implantable Lead Connection Status: 753985
Implantable Lead Connection Status: 753985
Implantable Lead Connection Status: 753985
Implantable Lead Implant Date: 20240219
Implantable Lead Implant Date: 20250203
Implantable Lead Implant Date: 20250203
Implantable Lead Location: 753858
Implantable Lead Location: 753859
Implantable Lead Location: 753860
Implantable Pulse Generator Implant Date: 20250203
Lead Channel Impedance Value: 437.5 Ohm
Lead Channel Impedance Value: 437.5 Ohm
Lead Channel Impedance Value: 900 Ohm
Lead Channel Pacing Threshold Amplitude: 0.875 V
Lead Channel Pacing Threshold Amplitude: 1.25 V
Lead Channel Pacing Threshold Amplitude: 1.25 V
Lead Channel Pacing Threshold Pulse Width: 0.3 ms
Lead Channel Pacing Threshold Pulse Width: 0.5 ms
Lead Channel Pacing Threshold Pulse Width: 0.5 ms
Lead Channel Sensing Intrinsic Amplitude: 1.5 mV
Lead Channel Sensing Intrinsic Amplitude: 12 mV
Lead Channel Setting Pacing Amplitude: 1.875
Lead Channel Setting Pacing Amplitude: 2 V
Lead Channel Setting Pacing Amplitude: 2.25 V
Lead Channel Setting Pacing Pulse Width: 0.3 ms
Lead Channel Setting Pacing Pulse Width: 0.5 ms
Lead Channel Setting Sensing Sensitivity: 0.5 mV
Pulse Gen Serial Number: 211038341
Zone Setting Status: 755011

## 2023-06-02 NOTE — Progress Notes (Signed)
 Electrophysiology Office Note:   Date:  06/02/2023  ID:  Reinaldo Meeker, DOB 1965/08/18, MRN [id number]  Primary Cardiologist: Verne Carrow, MD Primary Heart Failure: None Electrophysiologist: Lanier Prude, MD       History of Present Illness:   Catherine Mays is a 58 y.o. female with h/o HFrEF s/p CRT-D, PVC's, persistent AF s/p AV nodal ablation, CAD, ICM, MV regurgitation, GERD, hypothyroidism, CKD IIIa, IDA seen today for routine electrophysiology followup.   Admit 1/30-04/02/23 for recurrent symptomatic AF.  She was previously planned for PVI in 04/2023 but with ongoing symptoms was recommended for admission for optimization and consideration of BiV upgrade +/- AV node ablation. She was admitted and planned for TEE/DCCV but converted to NSR during the TEE. Tikosyn was continued and patient was upgraded from a single chamber ICD to an Abbott BiV ICD.  Given her young age, and the fact that she was in NSR AV node ablation was initially deferred.  However, she went back into AF and was taken back to the lab on 2/5 for AV node ablation which she tolerated well.   Since last being seen in our clinic the patient reports she has had shortness of breath, body aches and weakness.  She notes that her thyroid level has been low recently in the setting of recent medication adjustment.  She is not sure if her fatigue is related to this or her heart rate or medications.  She questions if her rosuvastatin is the culprit for making her body ache.  She is plan to follow-up with her endocrinologist in May.    She denies chest pain, palpitations, PND, orthopnea, nausea, vomiting, dizziness, syncope, edema, weight gain, or early satiety.   Review of systems complete and found to be negative unless listed in HPI.   EP Information / Studies Reviewed:    EKG is ordered today. Personal review as below.  EKG Interpretation Date/Time:  Tuesday June 02 2023 08:29:13 EDT Ventricular Rate:  90 PR  Interval:    QRS Duration:  160 QT Interval:  448 QTC Calculation: 548 R Axis:   168  Text Interpretation: AFL, Ventricular-paced rhythm Confirmed by Canary Brim ([id number]) on 06/02/2023 7:18:17 PM   ICD Interrogation-  reviewed in detail today,  See PACEART report.  Device History: Abbott Single Chamber ICD implanted 04/14/22 for ICM / HFrEF. Upgrade to CRT-D 03/30/23 History of appropriate therapy: No History of AAD therapy:  Tikosyn    Studies:  ECHO 03/27/23 > LVEF 20-25%, RV systolic function normal, LA mod dilated, RA mod dilated, mobile density on the lead in the RA that likely represents thrombus, TV regurgitation is moderate   Arrhythmia / AAD Persistent AF  AV Node Ablation 04/01/23   Sotalol > previously on, stopped 12/2022 Tikosyn > 01/2023   Risk Assessment/Calculations:    CHA2DS2-VASc Score = 3   This indicates a 3.2% annual risk of stroke. The patient's score is based upon: CHF History: 1 HTN History: 0 Diabetes History: 0 Stroke History: 0 Vascular Disease History: 1 Age Score: 0 Gender Score: 1             Physical Exam:   VS:  BP 102/72   Pulse 90   Ht 5\' 9"  (1.753 m)   Wt 149 lb 3.2 oz (67.7 kg)   SpO2 99%   BMI 22.03 kg/m    Wt Readings from Last 3 Encounters:  06/02/23 149 lb 3.2 oz (67.7 kg)  05/31/23 149 lb (67.6 kg)  05/14/23 149  lb 3.2 oz (67.7 kg)     GEN: Well nourished, well developed in no acute distress NECK: No JVD; No carotid bruits CARDIAC: Regular rate and rhythm (VP), no murmurs, rubs, gallops RESPIRATORY:  Clear to auscultation without rales, wheezing or rhonchi  ABDOMEN: Soft, non-tender, non-distended EXTREMITIES:  No edema; No deformity   ASSESSMENT AND PLAN:    Chronic systolic dysfunction s/p Abbott CRT-D  -euvolemic on exam -Stable on an appropriate medical regimen -Normal ICD function -See Pace Art report -With industry support, base rate had previously been changed to 80 but her mode switch base rate had not  been changed (was set at 90), this was changed to 80 bpm -Patient has been very sensitive to device changes, will plan to keep appointment next week to reduce base rate to 70 bpm and AMS base rate -allowing for reassessment of current symptoms / improvement   AFL / AF  -noted on EGM during clinic exam, recurred March 26-EGM more consistent with AFL -continue Tikosyn for now  -Avala for stroke prophylaxis -Continue Toprol 25 mg at bedtime, BP limits further increase of beta-blocker  Secondary Hypercoagulable State  -continue Eliquis 5mg  BID, dose reviewed and appropriate by age/wt   Shortness of Breath, Fatigue, Body Aches  -Unclear if this is related to her device settings (as was largely in the 90's), medications such as rosuvastatin or her hypothyroidism -She initially felt that her feeling poorly was related to losartan and she stopped this but this was also started and stopped around the time that she went into what appears to be AFL on device -Assess CMP, ESR, CRP, CK -She has plans to return to endocrine in May   Disposition:   Follow up with EP APP  next week as planned for base rate reduction.    Signed, Canary Brim, NP-C, AGACNP-BC [facility] - Electrophysiology  06/02/2023, 7:22 PM

## 2023-06-02 NOTE — Patient Instructions (Signed)
 Medication Instructions:  Your physician recommends that you continue on your current medications as directed. Please refer to the Current Medication list given to you today.  *If you need a refill on your cardiac medications before your next appointment, please call your pharmacy*  Lab Work: CMET, ESR, CRP, CK-TODAY If you have labs (blood work) drawn today and your tests are completely normal, you will receive your results only by: MyChart Message (if you have MyChart) OR A paper copy in the mail If you have any lab test that is abnormal or we need to change your treatment, we will call you to review the results.  Follow-Up: At [facility], you and your health needs are our priority.  As part of our continuing mission to provide you with exceptional heart care, our providers are all part of one team.  This team includes your primary Cardiologist (physician) and Advanced Practice Providers or APPs (Physician Assistants and Nurse Practitioners) who all work together to provide you with the care you need, when you need it.  Your next appointment:   As scheduled    1st Floor: - Lobby - Registration  - Pharmacy  - Lab - Cafe  2nd Floor: - PV Lab - Diagnostic Testing (echo, CT, nuclear med)  3rd Floor: - Vacant  4th Floor: - TCTS (cardiothoracic surgery) - AFib Clinic - Structural Heart Clinic - Vascular Surgery  - Vascular Ultrasound  5th Floor: - HeartCare Cardiology (general and EP) - Clinical Pharmacy for coumadin, hypertension, lipid, weight-loss medications, and med management appointments    Valet parking services will be available as well.

## 2023-06-03 ENCOUNTER — Other Ambulatory Visit (HOSPITAL_COMMUNITY): Payer: Self-pay | Admitting: Cardiology

## 2023-06-03 LAB — COMPREHENSIVE METABOLIC PANEL WITH GFR
ALT: 6 IU/L (ref 0–32)
AST: 13 IU/L (ref 0–40)
Albumin: 4.6 g/dL (ref 3.8–4.9)
Alkaline Phosphatase: 93 IU/L (ref 44–121)
BUN/Creatinine Ratio: 20 (ref 9–23)
BUN: 25 mg/dL — ABNORMAL HIGH (ref 6–24)
Bilirubin Total: 0.4 mg/dL (ref 0.0–1.2)
CO2: 21 mmol/L (ref 20–29)
Calcium: 9.3 mg/dL (ref 8.7–10.2)
Chloride: 100 mmol/L (ref 96–106)
Creatinine, Ser: 1.27 mg/dL — ABNORMAL HIGH (ref 0.57–1.00)
Globulin, Total: 2.2 g/dL (ref 1.5–4.5)
Glucose: 126 mg/dL — ABNORMAL HIGH (ref 70–99)
Potassium: 4.2 mmol/L (ref 3.5–5.2)
Sodium: 140 mmol/L (ref 134–144)
Total Protein: 6.8 g/dL (ref 6.0–8.5)
eGFR: 49 mL/min/{1.73_m2} — ABNORMAL LOW (ref >59)

## 2023-06-03 LAB — CK: Total CK: 56 U/L (ref 32–182)

## 2023-06-03 LAB — SEDIMENTATION RATE: Sed Rate: 13 mm/h (ref 0–40)

## 2023-06-03 LAB — C-REACTIVE PROTEIN: CRP: 43 mg/L — ABNORMAL HIGH (ref 0–10)

## 2023-06-06 ENCOUNTER — Encounter: Payer: Self-pay | Admitting: Cardiology

## 2023-06-08 ENCOUNTER — Telehealth: Payer: Self-pay

## 2023-06-08 NOTE — Telephone Encounter (Addendum)
 Alert remote transmission: AT/AF > threshold AF in progress from 4/8, overall controlled rates, burden 100%, Eliquis per PA report - route to triage for persistent arrhythmia.  AF ablation 5/27 CXL per EPIC Follow up as scheduled.  Patient just saw Creighton Doffing, NP on 4/8. Not sure why ablation was canceled.  Also, looks like patient was to f/u with a PA in 1 week; however, I do not see an appt with [facility] until end of May.  Will forward to Creighton Doffing, NP to see if she has any suggestions or recommendations.

## 2023-06-10 ENCOUNTER — Ambulatory Visit: Attending: Student | Admitting: Student

## 2023-06-10 ENCOUNTER — Ambulatory Visit: Admitting: Student

## 2023-06-10 ENCOUNTER — Encounter (HOSPITAL_COMMUNITY): Payer: Self-pay | Admitting: Hematology

## 2023-06-10 DIAGNOSIS — I4819 Other persistent atrial fibrillation: Secondary | ICD-10-CM

## 2023-06-10 DIAGNOSIS — Z9889 Other specified postprocedural states: Secondary | ICD-10-CM

## 2023-06-10 LAB — CUP PACEART INCLINIC DEVICE CHECK
Date Time Interrogation Session: 20250416123534
Implantable Lead Connection Status: 753985
Implantable Lead Connection Status: 753985
Implantable Lead Connection Status: 753985
Implantable Lead Implant Date: 20240219
Implantable Lead Implant Date: 20250203
Implantable Lead Implant Date: 20250203
Implantable Lead Location: 753858
Implantable Lead Location: 753859
Implantable Lead Location: 753860
Implantable Pulse Generator Implant Date: 20250203
Pulse Gen Serial Number: 211038341

## 2023-06-10 NOTE — Progress Notes (Signed)
  Pt did not realize today's visit was cancelled. Requested to be seen with continued AF/AFL and symptoms.   LRL reduced to 70. Remains in AFL. Offered attempt to pace out, with caveat that she could transition into AFib.   Will continue Tikosyn for now, and if symptoms not improved by next week will consider cardioversion    She had significant GI side effects on amiodarone previously, but is willing to rechallenge if felt necessary.   Would likely first try cardioversion on Tikosyn first as bridge to ablation consideration. Will ask for an ablation spot to tentatively be held for her.   Bambi Lever [address]" [place], PA-C  06/10/2023 12:16 PM

## 2023-06-11 ENCOUNTER — Other Ambulatory Visit (HOSPITAL_COMMUNITY)

## 2023-06-11 NOTE — Progress Notes (Signed)
 Remote ICD transmission.

## 2023-06-11 NOTE — Addendum Note (Signed)
 Addended by: Lott Rouleau A on: 06/11/2023 12:51 PM   Modules accepted: Orders

## 2023-06-15 ENCOUNTER — Telehealth: Payer: Self-pay | Admitting: Cardiology

## 2023-06-15 ENCOUNTER — Other Ambulatory Visit: Payer: Self-pay | Admitting: Physician Assistant

## 2023-06-15 NOTE — Telephone Encounter (Signed)
 Patient called and stated her symptoms are not getting better and was informed to call and let provider know. She would like to do cardioversion.

## 2023-06-15 NOTE — Telephone Encounter (Signed)
 Spoke with the patient and scheduled her for a cardioversion on 4/23. Instructions will be sent to her through MyChart.   I have also put her on the calendar for an ablation with Dr. Marven Slimmer on 6/24. She is aware to keep her appointment in May to discuss further.

## 2023-06-15 NOTE — Telephone Encounter (Signed)
 Patient c/o Palpitations:  STAT if patient reporting lightheadedness, shortness of breath, or chest pain  How long have you had palpitations/irregular HR/ Afib? Are you having the symptoms now?   Yes, fluttering in chest, tightness  Are you currently experiencing lightheadedness, SOB or CP?   SOB, dizzy  Do you have a history of afib (atrial fibrillation) or irregular heart rhythm?   Yes  Have you checked your BP or HR? (document readings if available):   Are you experiencing any other symptoms?   Nausea, loss of appetite, coughing, weak legs, back ache (due to coughing)  Patient stated she has been having afib and had her device lowered.  Patient stated that she is not feeling any better since her last appointment.

## 2023-06-16 ENCOUNTER — Encounter (HOSPITAL_COMMUNITY): Payer: Self-pay

## 2023-06-16 NOTE — Progress Notes (Signed)
 Pt called for pre procedure instructions. Arrival time 0930 NPO after midnight explained Instructed to take am meds with sip of water  and confirmed blood thinner consistency. Reports no missed doses of Eliquis  and to take in the am. Instructed pt need for ride home tomorrow and have responsible adult with them for 24 hrs post procedure.

## 2023-06-17 ENCOUNTER — Encounter (HOSPITAL_COMMUNITY): Admission: RE | Disposition: A | Discharge status: Home / Self Care | Source: Home / Self Care | Attending: Cardiology

## 2023-06-17 ENCOUNTER — Encounter (HOSPITAL_COMMUNITY): Payer: Self-pay

## 2023-06-17 ENCOUNTER — Ambulatory Visit (HOSPITAL_COMMUNITY)
Admission: RE | Admit: 2023-06-17 | Discharge: 2023-06-17 | Disposition: A | Discharge status: Home / Self Care | Attending: Cardiology | Admitting: Cardiology

## 2023-06-17 ENCOUNTER — Other Ambulatory Visit (HOSPITAL_COMMUNITY): Payer: Self-pay

## 2023-06-17 ENCOUNTER — Ambulatory Visit (HOSPITAL_BASED_OUTPATIENT_CLINIC_OR_DEPARTMENT_OTHER)
Admission: RE | Admit: 2023-06-17 | Discharge: 2023-06-17 | Disposition: A | Discharge status: Home / Self Care | Source: Home / Self Care | Attending: Cardiology | Admitting: Cardiology

## 2023-06-17 VITALS — BP 102/82 | HR 72 | Wt 148.2 lb

## 2023-06-17 DIAGNOSIS — I2510 Atherosclerotic heart disease of native coronary artery without angina pectoris: Secondary | ICD-10-CM | POA: Diagnosis not present

## 2023-06-17 DIAGNOSIS — I5022 Chronic systolic (congestive) heart failure: Secondary | ICD-10-CM | POA: Diagnosis not present

## 2023-06-17 DIAGNOSIS — R29898 Other symptoms and signs involving the musculoskeletal system: Secondary | ICD-10-CM | POA: Diagnosis not present

## 2023-06-17 DIAGNOSIS — I493 Ventricular premature depolarization: Secondary | ICD-10-CM

## 2023-06-17 DIAGNOSIS — Z8719 Personal history of other diseases of the digestive system: Secondary | ICD-10-CM

## 2023-06-17 DIAGNOSIS — I4819 Other persistent atrial fibrillation: Secondary | ICD-10-CM | POA: Diagnosis not present

## 2023-06-17 DIAGNOSIS — Z01818 Encounter for other preprocedural examination: Secondary | ICD-10-CM

## 2023-06-17 DIAGNOSIS — I340 Nonrheumatic mitral (valve) insufficiency: Secondary | ICD-10-CM

## 2023-06-17 DIAGNOSIS — Z538 Procedure and treatment not carried out for other reasons: Secondary | ICD-10-CM | POA: Insufficient documentation

## 2023-06-17 DIAGNOSIS — R5383 Other fatigue: Secondary | ICD-10-CM

## 2023-06-17 DIAGNOSIS — I8290 Acute embolism and thrombosis of unspecified vein: Secondary | ICD-10-CM

## 2023-06-17 DIAGNOSIS — E785 Hyperlipidemia, unspecified: Secondary | ICD-10-CM

## 2023-06-17 LAB — BASIC METABOLIC PANEL WITH GFR
Anion gap: 12 (ref 5–15)
BUN: 22 mg/dL — ABNORMAL HIGH (ref 6–20)
CO2: 22 mmol/L (ref 22–32)
Calcium: 8.8 mg/dL — ABNORMAL LOW (ref 8.9–10.3)
Chloride: 103 mmol/L (ref 98–111)
Creatinine, Ser: 1.19 mg/dL — ABNORMAL HIGH (ref 0.44–1.00)
GFR, Estimated: 53 mL/min — ABNORMAL LOW (ref >60)
Glucose, Bld: 103 mg/dL — ABNORMAL HIGH (ref 70–99)
Potassium: 3.7 mmol/L (ref 3.5–5.1)
Sodium: 137 mmol/L (ref 135–145)

## 2023-06-17 LAB — TSH: TSH: 0.287 u[IU]/mL — ABNORMAL LOW (ref 0.350–4.500)

## 2023-06-17 LAB — IRON AND TIBC
Iron: 52 ug/dL (ref 28–170)
Saturation Ratios: 12 % (ref 10.4–31.8)
TIBC: 428 ug/dL (ref 250–450)
UIBC: 376 ug/dL

## 2023-06-17 LAB — BRAIN NATRIURETIC PEPTIDE: B Natriuretic Peptide: 1224.8 pg/mL — ABNORMAL HIGH (ref 0.0–100.0)

## 2023-06-17 LAB — CBC
HCT: 39.1 % (ref 36.0–46.0)
Hemoglobin: 12.6 g/dL (ref 12.0–15.0)
MCH: 28.4 pg (ref 26.0–34.0)
MCHC: 32.2 g/dL (ref 30.0–36.0)
MCV: 88.3 fL (ref 80.0–100.0)
Platelets: 204 10*3/uL (ref 150–400)
RBC: 4.43 MIL/uL (ref 3.87–5.11)
RDW: 15.6 % — ABNORMAL HIGH (ref 11.5–15.5)
WBC: 8.6 10*3/uL (ref 4.0–10.5)
nRBC: 0 % (ref 0.0–0.2)

## 2023-06-17 LAB — T4, FREE: Free T4: 2.06 ng/dL — ABNORMAL HIGH (ref 0.61–1.12)

## 2023-06-17 LAB — FERRITIN: Ferritin: 48 ng/mL (ref 11–307)

## 2023-06-17 LAB — MAGNESIUM: Magnesium: 1.9 mg/dL (ref 1.7–2.4)

## 2023-06-17 SURGERY — CARDIOVERSION (CATH LAB)
Anesthesia: General

## 2023-06-17 MED ORDER — TORSEMIDE 20 MG PO TABS: 40.0000 mg | ORAL_TABLET | Freq: Every day | ORAL | 8 refills | Status: DC

## 2023-06-17 NOTE — Patient Instructions (Addendum)
 Thank you for coming in today  If you had labs drawn today, any labs that are abnormal the clinic will call you No news is good news  You have been order test for your leg related to leg weakness their office will call you and set that up after insurance approves test.  Medications: STOP Lasix  Start Torsemide  40 mg 2 tablets daily   Follow up appointments:  Your physician recommends that you schedule a follow-up appointment in:  Please call our office to schedule the follow-up appointment in May for June 2025.    Do the following things EVERYDAY: Weigh yourself in the morning before breakfast. Write it down and keep it in a log. Take your medicines as prescribed Eat low salt foods--Limit salt (sodium) to 2000 mg per day.  Stay as active as you can everyday Limit all fluids for the day to less than 2 liters   At the [facility], you and your health needs are our priority. As part of our continuing mission to provide you with exceptional heart care, we have created designated Provider Care Teams. These Care Teams include your primary Cardiologist (physician) and Advanced Practice Providers (APPs- Physician Assistants and Nurse Practitioners) who all work together to provide you with the care you need, when you need it.   You may see any of the following providers on your designated Care Team at your next follow up: Dr Jules Oar Dr Peder Bourdon Dr. Mimi Alt, NP Ruddy Corral, [place] [facility] [place], [place] Dennise Fitz, NP Luster Salters, PharmD   Please be sure to bring in all your medications bottles to every appointment.    Thank you for choosing [facility]  If you have any questions or concerns before your next appointment please send us  a message through [place] or call our office at [phone].    TO LEAVE A MESSAGE FOR THE NURSE SELECT OPTION 2, PLEASE LEAVE A MESSAGE  INCLUDING: YOUR NAME DATE OF BIRTH CALL BACK NUMBER REASON FOR CALL**this is important as we prioritize the call backs  YOU WILL RECEIVE A CALL BACK THE SAME DAY AS LONG AS YOU CALL BEFORE 4:00 PM

## 2023-06-17 NOTE — Progress Notes (Signed)
 Advanced Heart Failure Clinic Note  PCP: Dr. Del Favia HF Cardiology: Dr. Mitzie Anda  Catherine Mays is a 58 y.o. with history of CAD s/p anterior STEMI with DES to LAD, ischemic cardiomyopathy, mitral regurgitation, and paroxysmal atrial fibrillation.  She was admitted in 7/17 with anterior STEMI.  She had a late presentation to the cath lab.  Echo in 10/17 showed EF about 25% with LAD-territory wall motion abnormalities.  She had peri-MI atrial fibrillation and was started on Xarelto .  She was put on amiodarone .   After discharge, she continued to feel poorly.  She developed nausea, anorexia, and significant fatigue.  She was admitted for evaluation in 10/17 given concern for low output heart failure.  However, stopping amiodarone  essentially resolved her symptoms.  She had RHC showing preserved cardiac output but the PCWP was high due to prominent v-waves, presumably from significant mitral regurgitation.  Of note, she was in atrial fibrillation for a time during this admission.   She had St Jude ICD placed.  She is back at work for the [employer].   TEE in 8/18 to evaluate the mitral valve showed EF 30%, moderate MR (probably functional).    Patient was seen by Dr. Lawana Pray and noted to have very frequent PVCs on device interrogation.  Holter in 12/18 showed 41% PVCs.  She was started on sotalol  80 mg bid and titrated up to 120 mg bid.  Coreg  was stopped with the addition of sotalol  and losartan  was decreased to 12.5 mg daily due to low BP.  She had PVC ablation in 2/19 but PVCs recurred. Sotalol  was increased to 160 mg bid.   Echo 8/19 showed EF 30% with regional wall motion abnormalities, normal RV size and systolic function, moderate MR.   She was admitted in 10/19 with chest pain.  Cath showed patent LAD stent and no obstructive CAD.  She was found to be anemic with Fe deficiency (hgb down to 8).  FOBT+. She has had IV Fe and blood transfusion.  EGD and colonoscopy this year were  fairly unremarkable.  She had diverticulosis.  Capsule endoscopy negative in 9/21.   Echo in 1/22 showed EF 25-30%, wall motion abnormalities in LAD distribution, mild-moderate MR, normal RV.    Admitted 10/22 with anemia, hemoglobin of 8.6 (baseline around 10-11). She was transfused 1 unit of PRBC on 11/29/2020. GI was consulted and she underwent EGD on 11/30/2020 which showed no source of bleeding.  Capsule endoscopy on 12/01/2020 showed blood in the mid/distal small bowel and proximal colon.  Colonoscopy on 12/02/2020 showed blood in the terminal ileum but no clear source.  It is suspected that the source of bleeding is in the distal small bowel that is not able to be reached via routine colonoscope.  GI asked IR to consider an arteriogram to localize and treat the source of her bleeding. CT done and felt best option was DBE with Dr. Lon Risser at [place]. Double balloon endoscopy was done in 12/22, no definite cause of GI bleeding was found.   Echo in 3/23 showed EF 25-30%, apical akinesis, mild MR, normal RV, normal IVC. Echo was done today and reviewed, EF 25% with LAD territory akinesis, mild LV dilation, mild MR, RV normal, IVC normal.   Admitted 11/24 with hypotension and rate controlled AF. + orthostatics. GDMT & sotalol  held. Spontaneously converted to NSR. Planned to hold on sotalol  and start on Tikosyn  inpatient the following week. Planned admission 12/24 for Tikosyn  loading. Had waxing/waning PVC burden,  which improved with addition of low dose Toprol . She was discharged home, remained off Entresto  with low BP.  Seen in ED 02/11/23 for urgent DCCV, successful restoration of NSR.  Admitted 1/25 with symptomatic Afib with hypotension. Planned TEE/DCCV however she converted to NSR after intubation/coughing.  TEE showed EF 20-25%, normal RV, moderate MR and TR, ? Mobile density on RA lead ? thrombus. Bc negative. Underwent AV node ablation and upgrade to CRT-D. Per EP, plan to let leads mature and arrange  for AF ablation in 6 months.   Seen in ED 05/31/23 with weakness & fatigue. BNP 995 and given IV lasix  and discharged home.   Saw EP 06/10/23 and in AFL. Did not pace out as she could transition to AF. Arranged for DCCV.  Today she returns for an acute visit with complaints of SOB, leg weakness and cough. She presented to [facility] earlier today for DCCV, but was told she was in NSR by device interrogation, so this was cancelled. She typically feels poorly out of rhythm so she was surprised to learn she was in sinus & continues to feel bad. She has adry cough, SOB and leg weakness, but admits leg weakness has improved some. Cough worse with bending over. She has bendopnea. Denies palpitations, abnormal bleeding, CP, dizziness, edema, or PND. She has orthopnea. Appetite poor, has early satiety. No fever or chills. Weight at home 146 pounds. Taking all medications. Tentatively planned for AF ablation in June 2025.    ReDs reading: 34 %, normal  St Jude device interrogation (personally reviewed):  CorVue shows mild volume overload, BiV pacing > 99%. 79% AT/AF burden  Labs (6/24): K 4, creatinine 1.21, BNP 304 Labs (8/24): LDL 55 Labs (12/24): K 4.5, creatinine 1.01 Labs (2/25): K 4.3, creatinine 1.02 Labs (4/25): K 4.2, creatinine 1.27  PMH:  1. CAD: Anterior STEMI in 7/17 with late presentation to the cath lab. She had DES to proximal LAD.  No significant disease in other vessels.  - LHC (10/19): Patent LAD stent, no obstructive disease.  2. Chronic systolic CHF: Ischemic cardiomyopathy.  - Echo 10/17 with EF 25%, akinesis of the mid-apical anteroseptal and anterior walls, apical dyskinesis, moderate to severe MR with incomplete coaptation.  - RHC (10/17): mean RA 4, PA 59/17 mean 38, mean PCWP 27 with prominent V waves suggesting significant MR, CI 3.06, PVR 2 WU.  - ACEI cough.  - Echo (11/17): EF 30-35%, normal RV size and systolic function, severe MR with restriction of posterior leaflet.   - Echo  (8/19): EF 30% with regional wall motion abnormalities, normal RV size and systolic function, moderate MR.  - Echo (12/20): EF 30%, LAD wall motion abnormalities, normal RV, PASP 35, moderate functional MR.  - Echo (1/22): EF 25-30%, wall motion abnormalities in LAD distribution, mild-moderate MR, normal RV.  - Echo (3/23): EF 25-30%, apical akinesis, mild MR, normal RV, normal IVC.  - Echo (9/24): EF 25% with LAD territory akinesis, mild LV dilation, mild MR, RV normal, IVC normal.  - TEE 1/25: EF 20-25%, Rv normal, mobile density on RA lead/? Thrombus, moderate MR, moderate TR 3. Atrial fibrillation: Paroxysmal.  Noted 7/17 admission peri-MI, also noted again 10/17 admission. She did not tolerate amiodarone  due to nausea/anorexia.  - Started on Tikosyn  (12/24) - DCCV 12/24 to NSR - s/p AVN ablation 2/25 4. Mitral regurgitation: Moderate to severe on 10/17 echo, incomplete coaptation.  ?Etiology, her MI (anterior) does not typically lead to ischemic MR.  - Echo in  11/17 with severe MR, posterior leaflet restricted.  - TEE (8/18): EF 30%, moderately dilated LV with akinetic septal and anterior walls, moderate functional MR, normal RV size and systolic function.  - Echo (8/19): Moderate MR - Echo (3/23): Mild MR.  - Echo (9/24): Mild MR. - TEE (1/25): moderate MR 5. Hyperlipidemia: Myalgias with atorvastatin .  6. PVCs: Holter 12/18 with 41% PVCs.  - PVC ablation in 2/19 with recurrence of PVCs.  7. GI bleeding:  - EGD (4/21): unremarkable.  - Colonoscopy (7/21): Diverticulosis.  - Capsule endoscopy (9/21): negative.  - Double balloon endoscopy at [facility] in 12/22 did not show cause for bleeding.   SH: Married, lives in [place], works for the [employer], no smoking or ETOH.   Family History  Problem Relation Age of Onset   Arrhythmia Mother    Lung cancer Mother        bronchiectasis   Heart failure Father    Heart attack Father    Heart attack Brother    Arrhythmia Brother    Colon cancer  Neg Hx    Colon polyps Neg Hx    ROS: All systems reviewed and negative except as per HPI.   Current Outpatient Medications on File Prior to Encounter  Medication Sig Dispense Refill   acetaminophen  (TYLENOL ) 500 MG tablet Take 1,000 mg by mouth every 6 (six) hours as needed for mild pain.     apixaban  (ELIQUIS ) 5 MG TABS tablet Take 1 tablet (5 mg total) by mouth 2 (two) times daily. Resume Sunday 2/9     buPROPion  (WELLBUTRIN  XL) 300 MG 24 hr tablet Take 300 mg by mouth daily.     Calcium  Carb-Cholecalciferol (CALCIUM  + D3 PO) Take 1,200 mg by mouth every morning. Slow release     Coenzyme Q10 (COQ-10) 400 MG CAPS Take 400 mg by mouth daily.     dexlansoprazole  (DEXILANT ) 60 MG capsule TAKE 1 CAPSULE(60 MG) BY MOUTH DAILY 90 capsule 3   dofetilide  (TIKOSYN ) 250 MCG capsule Take 1 capsule (250 mcg total) by mouth 2 (two) times daily. 60 capsule 5   ferrous sulfate  325 (65 FE) MG EC tablet Take 325 mg by mouth daily.     fexofenadine (ALLEGRA) 180 MG tablet Take 180 mg by mouth daily.     furosemide  (LASIX ) 20 MG tablet Take 2 tablets as needed in case of weight gain 2 to 3 lbs in 24 hrs or 5 lbs in 7 days until weight back to baseline. (Patient taking differently: Take 40 mg by mouth daily.) 90 tablet 3   levothyroxine  (SYNTHROID ) 112 MCG tablet Take 112 mcg by mouth daily before breakfast.     metoprolol  succinate (TOPROL -XL) 25 MG 24 hr tablet Take 1 tablet (25 mg total) by mouth at bedtime. 30 tablet 5   nitroGLYCERIN  (NITROSTAT ) 0.4 MG SL tablet Place 1 tablet (0.4 mg total) under the tongue every 5 (five) minutes as needed for chest pain. 25 tablet 3   rosuvastatin  (CRESTOR ) 20 MG tablet Take 1 tablet (20 mg total) by mouth daily. 90 tablet 3   sertraline  (ZOLOFT ) 50 MG tablet Take 50 mg by mouth daily.     spironolactone  (ALDACTONE ) 25 MG tablet TAKE 1 TABLET(25 MG) BY MOUTH DAILY 30 tablet 11   traZODone  (DESYREL ) 50 MG tablet Take 25 mg by mouth at bedtime as needed for sleep.      triamcinolone  (NASACORT ) 55 MCG/ACT AERO nasal inhaler Place 2 sprays into the nose daily.  No current facility-administered medications on file prior to encounter.   Wt Readings from Last 3 Encounters:  06/17/23 67.2 kg (148 lb 3.2 oz)  06/02/23 67.7 kg (149 lb 3.2 oz)  05/31/23 67.6 kg (149 lb)   BP 102/82   Pulse 72   Wt 67.2 kg (148 lb 3.2 oz)   SpO2 97%   BMI 21.89 kg/m   PHYSICAL EXAM: General:  NAD. No resp difficulty, walked into clinic HEENT: Normal Neck: Supple. No JVD. Cor: Regular rate & rhythm. No rubs, gallops or murmurs. Lungs: Clear Abdomen: Soft, nontender, nondistended.  Extremities: No cyanosis, clubbing, rash, edema Neuro: Alert & oriented x 3, moves all 4 extremities w/o difficulty. Affect pleasant.  Assessment/Plan: 1. Atrial fibrillation/AFL: Unable to tolerate amiodarone  due to GI side effects. She failed sotalol . Required Emergenct  DCCV x 1 12/24. Now on Tikosyn . In AFL 05/20/23. Planned for DCCV however converted to SR.  - Continue Tikosyn  per EP.  - Continue Toprol -XL 25 mg at bedtime - Continue Eliquis  5 mg bid. She denies any bleeding issues  2. CAD: S/p anterior MI in 7/17 with DES to RCA.  Cath in 10/19 with no obstructive CAD.   - No chest pain. - No ASA given need for Eliquis  and h/o GIB  - Continue Crestor . Check lipids next visit. 3. Chronic systolic CHF: Ischemic cardiomyopathy.  EF 25% on echo in 10/17, 30-35% on echo in 11/17, 30% on TEE in 8/18. She has extensive LAD-territory scar. 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  Echo in 3/23 showed that EF remained 25-30% with LAD-territory scar. Echo 9/24 showed stable EF 25% with LAD territory akinesis, mild LV dilation, mild MR, RV normal, IVC normal. TEE (1/25) with EF 20-25%, normal RV, moderate MR and TR. With wide LBBB, now s/p CRT-D upgrade. BP chronically soft. Today, worse NYHA III symptoms, despite restoration of SR. She does not appear volume overloaded by exam, ReDs is 34%, but symptoms sound like  volume and device shows mild volume overload. - Stop Lasix . - Start torsemide  40 mg daily. BMET/BNP today.  - Continue spiro 25 mg daily.  - Continue Toprol  XL 25 mg at bedtime. - No BP room to start ARB/ARNi. - She did not tolerate dapagliflozin  due to yeast infections.  - Repeat echo in 2-3 months, post CRT-D upgrade to look for any EF improvement. If non-responder and remains symptomatic, may consider Barostim. This may also allow for more BP room to be more aggressive w/ GDMT.  4. Mitral regurgitation: Moderate to severe on 10/17 echo, severe on 11/17 echo.  Underwent TEE in 8/18.  This showed moderate MR that appeared to be functional.  No indication for surgery or Mitraclip. Echo in 3/23 showed mild MR. Echo 9/24 with mild MR. TEE this admission (1/25) with moderate MR.  5. PVCs: Very frequent on 12/18 holter (41% beats), she has had a PVC ablation in 2/19. Due to recurrence of PVCs post-ablation, she was placed on sotalol . Unfortunately, had break through AF, with PVCs. Now off sotalol  (and started on Tikosyn  for AF). Asymptomatic.  - Continue Toprol . 6. Hyperlipidemia: Tolerating Crestor . Goal LDL < 55.  Good LDL in 8/24.  - Check lipids next visit  7. GI bleeding: With anemia.  She had episode in 10/22, has not had overt recurrence. She has been considered for Watchman.   - She wants to hold off on Watchman for now, will to consider with a close recurrence of GI bleeding.  8. Thrombus vs vegetation  on ICD lead: w/u showed negative blood cx.  - No change. 9. Fatigue: Likely multifactorial. S/p thyroidectomy and on levothyroxine . Recent TSH low and free T4 elevated. - Repeat TFTs and will forward to her Endocrinologist.  - Check CBC and iron  panel today. 10. Leg weakness: CK not elevated. ? Claudication. - Arrange for ABIs.  Keep follow up with Dr. Mitzie Anda + echo.    [facility] FNP-BC 06/17/2023

## 2023-06-17 NOTE — Progress Notes (Signed)
 ReDS Vest / Clip - 06/17/23 1300       ReDS Vest / Clip   Station Marker C    Ruler Value 26.5    ReDS Value Range Low volume    ReDS Actual Value 34

## 2023-06-17 NOTE — H&P (Signed)
 Procedure canceled secondary to sinus rhythm.  She did not require cardioversion.  Message sent to Dr. Mitzie Anda about her follow-up and ongoing symptoms of shortness of breath.

## 2023-06-20 ENCOUNTER — Encounter: Payer: Self-pay | Admitting: Cardiology

## 2023-06-30 ENCOUNTER — Encounter: Payer: Self-pay | Admitting: Cardiology

## 2023-06-30 ENCOUNTER — Encounter: Payer: BC Managed Care – PPO | Admitting: Student

## 2023-06-30 NOTE — Telephone Encounter (Signed)
 Patient has appt now scheduled with Dr. Marven Slimmer for 07/16/23. Will continue to monitor remotes in interim.

## 2023-07-02 ENCOUNTER — Other Ambulatory Visit (HOSPITAL_COMMUNITY): Payer: BC Managed Care – PPO

## 2023-07-08 ENCOUNTER — Ambulatory Visit (HOSPITAL_COMMUNITY): Payer: Self-pay

## 2023-07-13 ENCOUNTER — Encounter (HOSPITAL_COMMUNITY): Payer: Self-pay

## 2023-07-15 ENCOUNTER — Other Ambulatory Visit (HOSPITAL_COMMUNITY): Payer: Self-pay

## 2023-07-15 ENCOUNTER — Other Ambulatory Visit (HOSPITAL_COMMUNITY): Payer: Self-pay | Admitting: *Deleted

## 2023-07-15 ENCOUNTER — Encounter (HOSPITAL_COMMUNITY)

## 2023-07-15 DIAGNOSIS — I5022 Chronic systolic (congestive) heart failure: Secondary | ICD-10-CM

## 2023-07-16 ENCOUNTER — Other Ambulatory Visit: Payer: Self-pay | Admitting: Physician Assistant

## 2023-07-16 ENCOUNTER — Ambulatory Visit (INDEPENDENT_AMBULATORY_CARE_PROVIDER_SITE_OTHER): Payer: BC Managed Care – PPO | Admitting: Cardiology

## 2023-07-16 ENCOUNTER — Ambulatory Visit: Payer: Self-pay | Admitting: Cardiology

## 2023-07-16 ENCOUNTER — Ambulatory Visit
Admission: RE | Admit: 2023-07-16 | Discharge: 2023-07-16 | Disposition: A | Discharge status: Home / Self Care | Source: Ambulatory Visit | Attending: Internal Medicine | Admitting: Internal Medicine

## 2023-07-16 VITALS — BP 96/78 | HR 71 | Ht 69.0 in | Wt 140.2 lb

## 2023-07-16 DIAGNOSIS — I5022 Chronic systolic (congestive) heart failure: Secondary | ICD-10-CM

## 2023-07-16 DIAGNOSIS — Z95810 Presence of automatic (implantable) cardiac defibrillator: Secondary | ICD-10-CM

## 2023-07-16 DIAGNOSIS — I4819 Other persistent atrial fibrillation: Secondary | ICD-10-CM

## 2023-07-16 DIAGNOSIS — Z79899 Other long term (current) drug therapy: Secondary | ICD-10-CM | POA: Insufficient documentation

## 2023-07-16 LAB — CUP PACEART INCLINIC DEVICE CHECK
Battery Remaining Longevity: 87 mo
Brady Statistic RA Percent Paced: 36 %
Brady Statistic RV Percent Paced: 96 %
Date Time Interrogation Session: 20250522170703
HighPow Impedance: 72 Ohm
Implantable Lead Connection Status: 753985
Implantable Lead Connection Status: 753985
Implantable Lead Connection Status: 753985
Implantable Lead Implant Date: 20240219
Implantable Lead Implant Date: 20250203
Implantable Lead Implant Date: 20250203
Implantable Lead Location: 753858
Implantable Lead Location: 753859
Implantable Lead Location: 753860
Implantable Pulse Generator Implant Date: 20250203
Lead Channel Impedance Value: 437.5 Ohm
Lead Channel Impedance Value: 462.5 Ohm
Lead Channel Impedance Value: 762.5 Ohm
Lead Channel Pacing Threshold Amplitude: 1 V
Lead Channel Pacing Threshold Amplitude: 1 V
Lead Channel Pacing Threshold Amplitude: 1.25 V
Lead Channel Pacing Threshold Amplitude: 1.25 V
Lead Channel Pacing Threshold Pulse Width: 0.3 ms
Lead Channel Pacing Threshold Pulse Width: 0.3 ms
Lead Channel Pacing Threshold Pulse Width: 0.5 ms
Lead Channel Pacing Threshold Pulse Width: 0.5 ms
Lead Channel Sensing Intrinsic Amplitude: 12 mV
Lead Channel Sensing Intrinsic Amplitude: 2.5 mV
Lead Channel Setting Pacing Amplitude: 1.875
Lead Channel Setting Pacing Amplitude: 2 V
Lead Channel Setting Pacing Amplitude: 2.25 V
Lead Channel Setting Pacing Pulse Width: 0.3 ms
Lead Channel Setting Pacing Pulse Width: 0.5 ms
Lead Channel Setting Sensing Sensitivity: 0.5 mV
Pulse Gen Serial Number: 211038341
Zone Setting Status: 755011

## 2023-07-16 MED ORDER — SODIUM CHLORIDE 0.9 % IV SOLN
510.0000 mg | Freq: Once | INTRAVENOUS | Status: AC
  Administered 2023-07-16: 510 mg via INTRAVENOUS
  Filled 2023-07-16: qty 510

## 2023-07-16 NOTE — H&P (View-Only) (Signed)
 Electrophysiology Office Follow up Visit Note:    Date:  07/16/2023   ID:  Catherine Mays, DOB 09/28/1965, MRN [id number]  PCP:  Omie Bickers, MD  [facility] Cardiologist:  Antoinette Batman, MD  [facility] Electrophysiologist:  Boyce Byes, MD    Interval History:     Catherine Mays is a 58 y.o. female who presents for a follow up visit.   She has an extensive atrial fibrillation/flutter history.  She is incredibly symptomatic with her arrhythmia and has failed sotalol .  Tikosyn  has not been completely effective either.  Because of rapidly conducted ventricular rates leading to hospitalization, she was upgraded to a biventricular device and an AV nodal ablation was performed.  This is helped but she continues to experience acute on chronic systolic heart failure when in atrial fibrillation.  She is doing well.  She is with her husband today.  She feels better than she has in the past but continues to have some dyspnea with exertion.       Past medical, surgical, social and family history were reviewed.  ROS:   Please see the history of present illness.    All other systems reviewed and are negative.  EKGs/Labs/Other Studies Reviewed:    The following studies were reviewed today:  Jul 16, 2023 in-clinic device interrogation personally reviewed Battery longevity 7 years Presenting rhythm is atrial fibrillation, V sensing at 34 BiV pacing 96% Lead parameter stable     EKG Interpretation Date/Time:  Thursday Jul 16 2023 15:56:56 EDT Ventricular Rate:  71 PR Interval:    QRS Duration:  162 QT Interval:  440 QTC Calculation: 479 R Axis:   184  Text Interpretation: Ventricular-paced rhythm When compared with ECG of 02-Jun-2023 08:29, Confirmed by Harvie Liner [phone]) on 07/16/2023 4:05:57 PM    Physical Exam:    VS:  BP 96/78 (BP Location: Right Arm, Patient Position: Sitting, Cuff Size: Normal)   Pulse 71   Ht 5\' 9"  (1.753 m)   Wt 140 lb 3.2  oz (63.6 kg)   SpO2 96%   BMI 20.70 kg/m     Wt Readings from Last 3 Encounters:  07/16/23 140 lb 3.2 oz (63.6 kg)  06/17/23 148 lb 3.2 oz (67.2 kg)  06/02/23 149 lb 3.2 oz (67.7 kg)     GEN: no distress CARD: RRR, No MRG RESP: No IWOB. CTAB.      ASSESSMENT:    1. Chronic systolic CHF (congestive heart failure) (HCC)   2. Persistent atrial fibrillation (HCC)   3. Encounter for long-term (current) use of high-risk medication   4. Presence of cardiac resynchronization therapy defibrillator (CRT-D)    PLAN:    In order of problems listed above:  #Persistent atrial fibrillation and flutter The patient has incredibly symptomatic atrial fibrillation flutter.  These have caused acute decompensations of her systolic heart failure.  This is improved somewhat after biventricular upgrade and AV node ablation due to the prevention of rapidly conducted atrial fibrillation but the loss of atrial kick is still leading to worsening of her functional status.  I have previously discussed using an atrial fibrillation ablation in an effort to maintain atrial function and improve cardiac output.  She is interested in proceeding with this.  She understands that this is not a guarantee and she may return to atrial fibrillation or continue to experience heart failure symptoms despite maintenance of normal rhythm  Discussed treatment options today for AF including antiarrhythmic drug therapy and ablation. Discussed risks,  recovery and likelihood of success with each treatment strategy. Risk, benefits, and alternatives to EP study and ablation for afib were discussed. These risks include but are not limited to stroke, bleeding, vascular damage, tamponade, perforation, damage to the esophagus, lungs, phrenic nerve and other structures, pulmonary vein stenosis, worsening renal function, coronary vasospasm and death.  Discussed potential need for repeat ablation procedures and antiarrhythmic drugs after an  initial ablation. The patient understands these risk and wishes to proceed.  We will therefore proceed with catheter ablation at the next available time.  Carto, ICE, anesthesia are requested for the procedure.  Will also obtain CT PV protocol prior to the procedure to exclude LAA thrombus and further evaluate atrial anatomy.  #High risk med monitoring-Tikosyn  QTc acceptable for ongoing Tikosyn  use today.  #Chronic systolic heart failure NYHA class III.  Warm and dry on exam.  Follows with the heart failure clinic. Rhythm control indicated.  Continue metoprolol , spironolactone , torsemide .  #CRT-D in situ Device functioning appropriately.  Continue remote monitoring.  Signed, Harvie Liner, MD, [facility], [facility] 07/16/2023 4:06 PM    Electrophysiology [facility]

## 2023-07-16 NOTE — Patient Instructions (Signed)
 Medication Instructions:  Your physician recommends that you continue on your current medications as directed. Please refer to the Current Medication list given to you today.  *If you need a refill on your cardiac medications before your next appointment, please call your pharmacy*  Lab Work: BMET and CBC - anytime after May 24th at any [facility] location  Testing/Procedures: Ablation as scheduled   Follow-Up: At [facility], you and your health needs are our priority.  As part of our continuing mission to provide you with exceptional heart care, our providers are all part of one team.  This team includes your primary Cardiologist (physician) and Advanced Practice Providers or APPs (Physician Assistants and Nurse Practitioners) who all work together to provide you with the care you need, when you need it.  Your next appointment:   We will call you to schedule your follow up appointments

## 2023-07-16 NOTE — Progress Notes (Signed)
 Electrophysiology Office Follow up Visit Note:    Date:  07/16/2023   ID:  Catherine Mays, DOB 09/28/1965, MRN [id number]  PCP:  Omie Bickers, MD  [facility] Cardiologist:  Catherine Batman, MD  [facility] Electrophysiologist:  Catherine Byes, MD    Interval History:     Catherine Mays is a 58 y.o. female who presents for a follow up visit.   She has an extensive atrial fibrillation/flutter history.  She is incredibly symptomatic with her arrhythmia and has failed sotalol .  Tikosyn  has not been completely effective either.  Because of rapidly conducted ventricular rates leading to hospitalization, she was upgraded to a biventricular device and an AV nodal ablation was performed.  This is helped but she continues to experience acute on chronic systolic heart failure when in atrial fibrillation.  She is doing well.  She is with her husband today.  She feels better than she has in the past but continues to have some dyspnea with exertion.       Past medical, surgical, social and family history were reviewed.  ROS:   Please see the history of present illness.    All other systems reviewed and are negative.  EKGs/Labs/Other Studies Reviewed:    The following studies were reviewed today:  Jul 16, 2023 in-clinic device interrogation personally reviewed Battery longevity 7 years Presenting rhythm is atrial fibrillation, V sensing at 34 BiV pacing 96% Lead parameter stable     EKG Interpretation Date/Time:  Thursday Jul 16 2023 15:56:56 EDT Ventricular Rate:  71 PR Interval:    QRS Duration:  162 QT Interval:  440 QTC Calculation: 479 R Axis:   184  Text Interpretation: Ventricular-paced rhythm When compared with ECG of 02-Jun-2023 08:29, Confirmed by Harvie Liner [phone]) on 07/16/2023 4:05:57 PM    Physical Exam:    VS:  BP 96/78 (BP Location: Right Arm, Patient Position: Sitting, Cuff Size: Normal)   Pulse 71   Ht 5\' 9"  (1.753 m)   Wt 140 lb 3.2  oz (63.6 kg)   SpO2 96%   BMI 20.70 kg/m     Wt Readings from Last 3 Encounters:  07/16/23 140 lb 3.2 oz (63.6 kg)  06/17/23 148 lb 3.2 oz (67.2 kg)  06/02/23 149 lb 3.2 oz (67.7 kg)     GEN: no distress CARD: RRR, No MRG RESP: No IWOB. CTAB.      ASSESSMENT:    1. Chronic systolic CHF (congestive heart failure) (HCC)   2. Persistent atrial fibrillation (HCC)   3. Encounter for long-term (current) use of high-risk medication   4. Presence of cardiac resynchronization therapy defibrillator (CRT-D)    PLAN:    In order of problems listed above:  #Persistent atrial fibrillation and flutter The patient has incredibly symptomatic atrial fibrillation flutter.  These have caused acute decompensations of her systolic heart failure.  This is improved somewhat after biventricular upgrade and AV node ablation due to the prevention of rapidly conducted atrial fibrillation but the loss of atrial kick is still leading to worsening of her functional status.  I have previously discussed using an atrial fibrillation ablation in an effort to maintain atrial function and improve cardiac output.  She is interested in proceeding with this.  She understands that this is not a guarantee and she may return to atrial fibrillation or continue to experience heart failure symptoms despite maintenance of normal rhythm  Discussed treatment options today for AF including antiarrhythmic drug therapy and ablation. Discussed risks,  recovery and likelihood of success with each treatment strategy. Risk, benefits, and alternatives to EP study and ablation for afib were discussed. These risks include but are not limited to stroke, bleeding, vascular damage, tamponade, perforation, damage to the esophagus, lungs, phrenic nerve and other structures, pulmonary vein stenosis, worsening renal function, coronary vasospasm and death.  Discussed potential need for repeat ablation procedures and antiarrhythmic drugs after an  initial ablation. The patient understands these risk and wishes to proceed.  We will therefore proceed with catheter ablation at the next available time.  Carto, ICE, anesthesia are requested for the procedure.  Will also obtain CT PV protocol prior to the procedure to exclude LAA thrombus and further evaluate atrial anatomy.  #High risk med monitoring-Tikosyn  QTc acceptable for ongoing Tikosyn  use today.  #Chronic systolic heart failure NYHA class III.  Warm and dry on exam.  Follows with the heart failure clinic. Rhythm control indicated.  Continue metoprolol , spironolactone , torsemide .  #CRT-D in situ Device functioning appropriately.  Continue remote monitoring.  Signed, Harvie Liner, MD, [facility] 07/16/2023 4:06 PM    Electrophysiology [facility]

## 2023-07-17 ENCOUNTER — Other Ambulatory Visit: Payer: Self-pay

## 2023-07-17 DIAGNOSIS — Z95810 Presence of automatic (implantable) cardiac defibrillator: Secondary | ICD-10-CM

## 2023-07-17 DIAGNOSIS — I4819 Other persistent atrial fibrillation: Secondary | ICD-10-CM

## 2023-07-17 DIAGNOSIS — I5022 Chronic systolic (congestive) heart failure: Secondary | ICD-10-CM

## 2023-07-17 DIAGNOSIS — Z79899 Other long term (current) drug therapy: Secondary | ICD-10-CM

## 2023-07-21 ENCOUNTER — Ambulatory Visit (HOSPITAL_COMMUNITY): Admit: 2023-07-21 | Payer: BC Managed Care – PPO | Admitting: Cardiology

## 2023-07-21 ENCOUNTER — Encounter (HOSPITAL_COMMUNITY): Payer: Self-pay

## 2023-07-21 SURGERY — ATRIAL FIBRILLATION ABLATION
Anesthesia: General

## 2023-07-28 ENCOUNTER — Ambulatory Visit (HOSPITAL_COMMUNITY)
Admission: RE | Admit: 2023-07-28 | Discharge: 2023-07-28 | Disposition: A | Discharge status: Home / Self Care | Source: Ambulatory Visit | Attending: Cardiology | Admitting: Cardiology

## 2023-07-28 ENCOUNTER — Ambulatory Visit (HOSPITAL_COMMUNITY)
Admission: RE | Admit: 2023-07-28 | Discharge: 2023-07-28 | Disposition: A | Discharge status: Home / Self Care | Source: Ambulatory Visit | Attending: Family Medicine | Admitting: Family Medicine

## 2023-07-28 DIAGNOSIS — I480 Paroxysmal atrial fibrillation: Secondary | ICD-10-CM | POA: Diagnosis present

## 2023-07-28 DIAGNOSIS — R29898 Other symptoms and signs involving the musculoskeletal system: Secondary | ICD-10-CM | POA: Insufficient documentation

## 2023-07-28 MED ORDER — IOHEXOL 350 MG/ML SOLN
100.0000 mL | Freq: Once | INTRAVENOUS | Status: AC | PRN
  Administered 2023-07-28: 100 mL via INTRAVENOUS

## 2023-07-29 ENCOUNTER — Ambulatory Visit (INDEPENDENT_AMBULATORY_CARE_PROVIDER_SITE_OTHER)

## 2023-07-29 DIAGNOSIS — I255 Ischemic cardiomyopathy: Secondary | ICD-10-CM | POA: Diagnosis not present

## 2023-07-29 LAB — CUP PACEART REMOTE DEVICE CHECK
Battery Remaining Longevity: 85 mo
Battery Remaining Percentage: 92 %
Battery Voltage: 2.99 V
Brady Statistic AP VP Percent: 91 %
Brady Statistic AP VS Percent: 2.4 %
Brady Statistic AS VP Percent: 6.6 %
Brady Statistic AS VS Percent: 1 %
Brady Statistic RA Percent Paced: 39 %
Date Time Interrogation Session: 20250604020144
HighPow Impedance: 64 Ohm
Implantable Lead Connection Status: 753985
Implantable Lead Connection Status: 753985
Implantable Lead Connection Status: 753985
Implantable Lead Implant Date: 20240219
Implantable Lead Implant Date: 20250203
Implantable Lead Implant Date: 20250203
Implantable Lead Location: 753858
Implantable Lead Location: 753859
Implantable Lead Location: 753860
Implantable Pulse Generator Implant Date: 20250203
Lead Channel Impedance Value: 390 Ohm
Lead Channel Impedance Value: 410 Ohm
Lead Channel Impedance Value: 750 Ohm
Lead Channel Pacing Threshold Amplitude: 0.75 V
Lead Channel Pacing Threshold Amplitude: 0.875 V
Lead Channel Pacing Threshold Amplitude: 1.25 V
Lead Channel Pacing Threshold Pulse Width: 0.3 ms
Lead Channel Pacing Threshold Pulse Width: 0.5 ms
Lead Channel Pacing Threshold Pulse Width: 0.5 ms
Lead Channel Sensing Intrinsic Amplitude: 12 mV
Lead Channel Sensing Intrinsic Amplitude: 3.2 mV
Lead Channel Setting Pacing Amplitude: 1.875
Lead Channel Setting Pacing Amplitude: 2 V
Lead Channel Setting Pacing Amplitude: 2.25 V
Lead Channel Setting Pacing Pulse Width: 0.3 ms
Lead Channel Setting Pacing Pulse Width: 0.5 ms
Lead Channel Setting Sensing Sensitivity: 0.5 mV
Pulse Gen Serial Number: 211038341
Zone Setting Status: 755011

## 2023-07-29 LAB — BASIC METABOLIC PANEL WITH GFR
BUN/Creatinine Ratio: 18 (ref 9–23)
BUN: 29 mg/dL — ABNORMAL HIGH (ref 6–24)
CO2: 22 mmol/L (ref 20–29)
Calcium: 9 mg/dL (ref 8.7–10.2)
Chloride: 97 mmol/L (ref 96–106)
Creatinine, Ser: 1.61 mg/dL — ABNORMAL HIGH (ref 0.57–1.00)
Glucose: 109 mg/dL — ABNORMAL HIGH (ref 70–99)
Potassium: 4.4 mmol/L (ref 3.5–5.2)
Sodium: 137 mmol/L (ref 134–144)
eGFR: 37 mL/min/{1.73_m2} — ABNORMAL LOW (ref >59)

## 2023-07-29 LAB — CBC
Hematocrit: 42.5 % (ref 34.0–46.6)
Hemoglobin: 13.6 g/dL (ref 11.1–15.9)
MCH: 30.4 pg (ref 26.6–33.0)
MCHC: 32 g/dL (ref 31.5–35.7)
MCV: 95 fL (ref 79–97)
Platelets: 150 10*3/uL (ref 150–450)
RBC: 4.48 x10E6/uL (ref 3.77–5.28)
RDW: 16.3 % — ABNORMAL HIGH (ref 11.7–15.4)
WBC: 7 10*3/uL (ref 3.4–10.8)

## 2023-07-29 LAB — VAS US ABI WITH/WO TBI
Left ABI: 1.17
Right ABI: 1.14

## 2023-07-31 ENCOUNTER — Telehealth (HOSPITAL_COMMUNITY): Payer: Self-pay

## 2023-07-31 DIAGNOSIS — I5022 Chronic systolic (congestive) heart failure: Secondary | ICD-10-CM

## 2023-07-31 MED ORDER — TORSEMIDE 20 MG PO TABS: ORAL_TABLET | ORAL | 5 refills | Status: DC

## 2023-07-31 NOTE — Telephone Encounter (Signed)
 Spoke with patient and made her aware of the below. Repeat labs ordered and scheduled.   New prescription sent to patient preferred pharmacy.   Advised patient to call back to office with any issues, questions, or concerns. Patient verbalized understanding.

## 2023-07-31 NOTE — Telephone Encounter (Signed)
 Patient called triage line- reports that she has been taking 60mg  of torsemide  daily and sometimes 80 when needed due to fluid accumulation. Patient reports that 60mg  has really been beneficial and reports that she would like to know if Camilo Cella, NP would be okay prescribing this dose. Reports she has been taking this dose for around 1 month now.   Advised patient I would forward message to Camilo Cella, NP for review. Patient did have labs drawn 3 days ago.

## 2023-07-31 NOTE — Telephone Encounter (Signed)
  [place], Arlice Bene, FNP  Laure Leone B, RN Caller: Unspecified (Today, 11:42 AM) Recent labs showed her renal function has declined on the higher diuretic dose. Let's first try torsemide  60 mg daily, alternating with 40 mg every other day to see if we can achieve control of symptoms while preserving renal function. Please have her come in for BMET and BNP in 1 week, then we can tweek dosing from there.   Attempted to call patient, left message for patient to call back to office.

## 2023-08-01 ENCOUNTER — Ambulatory Visit: Payer: Self-pay | Admitting: Cardiology

## 2023-08-04 ENCOUNTER — Other Ambulatory Visit: Payer: Self-pay | Admitting: Physician Assistant

## 2023-08-07 ENCOUNTER — Ambulatory Visit (HOSPITAL_COMMUNITY): Payer: Self-pay | Admitting: Family Medicine

## 2023-08-07 ENCOUNTER — Ambulatory Visit (HOSPITAL_COMMUNITY)
Admission: RE | Admit: 2023-08-07 | Discharge: 2023-08-07 | Disposition: A | Discharge status: Home / Self Care | Source: Ambulatory Visit | Attending: Cardiology | Admitting: Cardiology

## 2023-08-07 DIAGNOSIS — I5022 Chronic systolic (congestive) heart failure: Secondary | ICD-10-CM | POA: Diagnosis present

## 2023-08-07 LAB — BASIC METABOLIC PANEL WITH GFR
Anion gap: 14 (ref 5–15)
BUN: 24 mg/dL — ABNORMAL HIGH (ref 6–20)
CO2: 26 mmol/L (ref 22–32)
Calcium: 8.8 mg/dL — ABNORMAL LOW (ref 8.9–10.3)
Chloride: 98 mmol/L (ref 98–111)
Creatinine, Ser: 1.36 mg/dL — ABNORMAL HIGH (ref 0.44–1.00)
GFR, Estimated: 45 mL/min — ABNORMAL LOW (ref >60)
Glucose, Bld: 117 mg/dL — ABNORMAL HIGH (ref 70–99)
Potassium: 3.6 mmol/L (ref 3.5–5.1)
Sodium: 138 mmol/L (ref 135–145)

## 2023-08-07 LAB — BRAIN NATRIURETIC PEPTIDE: B Natriuretic Peptide: 1373.3 pg/mL — ABNORMAL HIGH (ref 0.0–100.0)

## 2023-08-07 NOTE — Telephone Encounter (Addendum)
 Pt aware, agreeable, and verbalized understanding   ----- Message from Elmarie Hacking sent at 08/07/2023  2:05 PM EDT ----- Renal function improved. BNP remains elevated.   OK to take extra 20 mg of torsemide  PRN ----- Message ----- From: Interface, Lab In [place] Sent: 08/07/2023  11:46 AM EDT To: Elmarie Hacking, FNP

## 2023-08-10 NOTE — Progress Notes (Signed)
 Spoke to patient and instructed them to come at 0800  and to be NPO after 0000.  Medications reviewed.    Confirmed that patient will have a ride home and someone to stay with them for 24 hours after the procedure.

## 2023-08-11 ENCOUNTER — Ambulatory Visit (HOSPITAL_COMMUNITY): Admitting: Certified Registered"

## 2023-08-11 ENCOUNTER — Other Ambulatory Visit: Payer: Self-pay

## 2023-08-11 ENCOUNTER — Encounter (HOSPITAL_COMMUNITY): Payer: Self-pay | Admitting: Cardiology

## 2023-08-11 ENCOUNTER — Ambulatory Visit (HOSPITAL_COMMUNITY)
Admission: RE | Admit: 2023-08-11 | Discharge: 2023-08-11 | Disposition: A | Discharge status: Home / Self Care | Attending: Cardiology | Admitting: Cardiology

## 2023-08-11 ENCOUNTER — Ambulatory Visit (HOSPITAL_COMMUNITY)

## 2023-08-11 ENCOUNTER — Encounter: Payer: Self-pay | Admitting: Emergency Medicine

## 2023-08-11 ENCOUNTER — Encounter (HOSPITAL_COMMUNITY)
Admission: RE | Disposition: A | Discharge status: Home / Self Care | Payer: Self-pay | Source: Home / Self Care | Attending: Cardiology

## 2023-08-11 DIAGNOSIS — K219 Gastro-esophageal reflux disease without esophagitis: Secondary | ICD-10-CM | POA: Diagnosis not present

## 2023-08-11 DIAGNOSIS — Z95810 Presence of automatic (implantable) cardiac defibrillator: Secondary | ICD-10-CM | POA: Insufficient documentation

## 2023-08-11 DIAGNOSIS — I4891 Unspecified atrial fibrillation: Secondary | ICD-10-CM | POA: Diagnosis not present

## 2023-08-11 DIAGNOSIS — I2510 Atherosclerotic heart disease of native coronary artery without angina pectoris: Secondary | ICD-10-CM | POA: Diagnosis not present

## 2023-08-11 DIAGNOSIS — I081 Rheumatic disorders of both mitral and tricuspid valves: Secondary | ICD-10-CM | POA: Insufficient documentation

## 2023-08-11 DIAGNOSIS — I252 Old myocardial infarction: Secondary | ICD-10-CM | POA: Diagnosis not present

## 2023-08-11 DIAGNOSIS — I4819 Other persistent atrial fibrillation: Secondary | ICD-10-CM | POA: Diagnosis not present

## 2023-08-11 DIAGNOSIS — I5023 Acute on chronic systolic (congestive) heart failure: Secondary | ICD-10-CM | POA: Insufficient documentation

## 2023-08-11 DIAGNOSIS — I110 Hypertensive heart disease with heart failure: Secondary | ICD-10-CM | POA: Diagnosis not present

## 2023-08-11 HISTORY — PX: TRANSESOPHAGEAL ECHOCARDIOGRAM (CATH LAB): EP1270

## 2023-08-11 LAB — ECHO TEE

## 2023-08-11 SURGERY — TRANSESOPHAGEAL ECHOCARDIOGRAM (TEE) (CATHLAB)
Anesthesia: Monitor Anesthesia Care

## 2023-08-11 MED ORDER — SODIUM CHLORIDE 0.9 % IV SOLN: INTRAVENOUS | Status: DC

## 2023-08-11 MED ORDER — PHENYLEPHRINE HCL-NACL 20-0.9 MG/250ML-% IV SOLN
INTRAVENOUS | Status: DC | PRN
  Administered 2023-08-11: 50 ug/min via INTRAVENOUS

## 2023-08-11 MED ORDER — PROPOFOL 10 MG/ML IV BOLUS
INTRAVENOUS | Status: DC | PRN
  Administered 2023-08-11: 70 ug/kg/min via INTRAVENOUS
  Administered 2023-08-11: 25 mg via INTRAVENOUS

## 2023-08-11 NOTE — Transfer of Care (Signed)
 Immediate Anesthesia Transfer of Care Note  Patient: Catherine Mays  Procedure(s) Performed: TRANSESOPHAGEAL ECHOCARDIOGRAM  Patient Location: PACU  Anesthesia Type:MAC  Level of Consciousness: drowsy  Airway & Oxygen Therapy: Patient Spontanous Breathing and Patient connected to face mask oxygen  Post-op Assessment: Report given to RN and Post -op Vital signs reviewed and stable  Post vital signs: Reviewed and stable  Last Vitals:  Vitals Value Taken Time  BP 92/75 08/11/23 10:15  Temp    Pulse 76 08/11/23 10:18  Resp 19 08/11/23 10:18  SpO2 92 % 08/11/23 10:18  Vitals shown include unfiled device data.  Last Pain:  Vitals:   08/11/23 0749  TempSrc: Temporal         Complications: No notable events documented.

## 2023-08-11 NOTE — Anesthesia Preprocedure Evaluation (Signed)
 Anesthesia Evaluation  Patient identified by MRN, date of birth, ID band Patient awake    Reviewed: Allergy & Precautions, NPO status , Patient's Chart, lab work & pertinent test results, reviewed documented beta blocker date and time   History of Anesthesia Complications Negative for: history of anesthetic complications  Airway Mallampati: I  TM Distance: >3 FB Neck ROM: Full    Dental  (+) Dental Advisory Given   Pulmonary neg pulmonary ROS   breath sounds clear to auscultation       Cardiovascular hypertension, Pt. on medications and Pt. on home beta blockers (-) angina + CAD, + Past MI and +CHF  + dysrhythmias Atrial Fibrillation + pacemaker + Cardiac Defibrillator  Rhythm:Regular Rate:Normal  02/2023 ECHO: EF 20 to 25%. 1.  The LV has severely decreased function, demonstrates global hypokinesis.   2. RVF is normal. The right ventricular size is normal.   3. Left atrial size was moderately dilated. No left atrial/left atrial  appendage thrombus was detected.   4. Right atrial size was mildly dilated. There is an AICD lead present in  the RA and RV. There is a mobile density on the lead in the RA that likely  represents thrombus. If concern for endocarditis, recommend blood cx and  inflammatory markers.   5. The mitral valve is normal in structure. Moderate mitral valve regurgitation related to annular dilatation. No evidence of mitral stenosis.   6. Tricuspid valve regurgitation is moderate.   7. The aortic valve is tricuspid. Aortic valve regurgitation is trivial.     Neuro/Psych negative neurological ROS     GI/Hepatic Neg liver ROS,GERD  Medicated and Controlled,,  Endo/Other  Hypothyroidism    Renal/GU Renal InsufficiencyRenal disease     Musculoskeletal   Abdominal   Peds  Hematology eliquis    Anesthesia Other Findings   Reproductive/Obstetrics                              Anesthesia Physical Anesthesia Plan  ASA: 4  Anesthesia Plan: MAC   Post-op Pain Management: Minimal or no pain anticipated   Induction:   PONV Risk Score and Plan: 2 and Treatment may vary due to age or medical condition  Airway Management Planned: Natural Airway and Nasal Cannula  Additional Equipment: None  Intra-op Plan:   Post-operative Plan:   Informed Consent: I have reviewed the patients History and Physical, chart, labs and discussed the procedure including the risks, benefits and alternatives for the proposed anesthesia with the patient or authorized representative who has indicated his/her understanding and acceptance.     Dental advisory given  Plan Discussed with: Surgeon  Anesthesia Plan Comments:        Anesthesia Quick Evaluation

## 2023-08-11 NOTE — Interval H&P Note (Signed)
 History and Physical Interval Note:  08/11/2023 9:16 AM  Catherine Mays  has presented today for surgery, with the diagnosis of r/o clot.  The various methods of treatment have been discussed with the patient and family. After consideration of risks, benefits and other options for treatment, the patient has consented to  Procedure(s): TRANSESOPHAGEAL ECHOCARDIOGRAM (N/A) as a surgical intervention.  The patient's history has been reviewed, patient examined, no change in status, stable for surgery.  I have reviewed the patient's chart and labs.  Questions were answered to the patient's satisfaction.    CT scan delay imaging showed possible thrombus. TEE to assist with details.   [employer]

## 2023-08-11 NOTE — Progress Notes (Signed)
  Echocardiogram Echocardiogram Transesophageal has been performed.  Farley Honer, RDCS 08/11/2023, 10:18 AM

## 2023-08-11 NOTE — Pre-Procedure Instructions (Signed)
 Spoke with patient to discuss upcoming procedure.   CT: completed, possible thrombus, TEE done today- no thrombus ok to proceed with ablation next week Labs: completed.   Any recent signs of acute illness or been started on antibiotics? no Any new medications started? No Any medications to hold?   Any missed doses of blood thinner?  Eliquis - takes twice a day, hasn't missed any doses in last 4 weeks, Don't take dose morning of procedure Advised patient to continue taking ANTICOAGULANT: Eliquis  (Apixaban ) twice daily without missing any doses.  Medication instructions:  On the morning of your procedure DO NOT take any medication.,  Nothing to eat or drink after midnight prior to your procedure.  Confirmed patient is scheduled for Atrial Fibrillation Ablation on Tuesday, June 24 with Dr. Harvie Liner. Instructed patient to arrive at the Main Entrance A at [facility]: [address] [place], [place] [address] and check in at Admitting at 0515 AM  Advised of plan to go home the same day and will only stay overnight if medically necessary. You MUST have a responsible adult to drive you home and MUST be with you the first 24 hours after you arrive home or your procedure could be cancelled.  Patient verbalized understanding to all instructions provided and agreed to proceed with procedure.

## 2023-08-11 NOTE — Anesthesia Postprocedure Evaluation (Signed)
 Anesthesia Post Note  Patient: Catherine Mays  Procedure(s) Performed: TRANSESOPHAGEAL ECHOCARDIOGRAM     Patient location during evaluation: Cath Lab Anesthesia Type: MAC Level of consciousness: oriented, awake and alert and patient cooperative Pain management: pain level controlled Vital Signs Assessment: post-procedure vital signs reviewed and stable Respiratory status: spontaneous breathing, nonlabored ventilation and respiratory function stable Cardiovascular status: blood pressure returned to baseline and stable Postop Assessment: no apparent nausea or vomiting Anesthetic complications: no  No notable events documented.  Last Vitals:  Vitals:   08/11/23 1035 08/11/23 1040  BP: 90/70 99/79  Pulse: 74 73  Resp: 19 15  Temp:    SpO2: 100% 93%    Last Pain:  Vitals:   08/11/23 0749  TempSrc: Temporal                 Bradely Rudin,E. Lougenia Morrissey

## 2023-08-12 ENCOUNTER — Encounter (HOSPITAL_COMMUNITY): Payer: Self-pay | Admitting: Hematology

## 2023-08-17 NOTE — Pre-Procedure Instructions (Signed)
 Instructed patient on the following items: Arrival time 0515 Nothing to eat or drink after midnight No meds AM of procedure Responsible person to drive you home and stay with you for 24 hrs  Have you missed any doses of anti-coagulant Elqiuis- takes twice a day, hasn't missed any doses.  Don't take dose morning of procedure.  TEE results- no clot

## 2023-08-18 ENCOUNTER — Ambulatory Visit (HOSPITAL_BASED_OUTPATIENT_CLINIC_OR_DEPARTMENT_OTHER): Payer: Self-pay | Admitting: Registered Nurse

## 2023-08-18 ENCOUNTER — Ambulatory Visit (HOSPITAL_COMMUNITY): Payer: Self-pay | Admitting: Registered Nurse

## 2023-08-18 ENCOUNTER — Other Ambulatory Visit (HOSPITAL_COMMUNITY): Payer: Self-pay

## 2023-08-18 ENCOUNTER — Other Ambulatory Visit: Payer: Self-pay

## 2023-08-18 ENCOUNTER — Ambulatory Visit (HOSPITAL_COMMUNITY)
Admission: RE | Disposition: A | Discharge status: Home / Self Care | Payer: Self-pay | Source: Home / Self Care | Attending: Cardiology

## 2023-08-18 ENCOUNTER — Ambulatory Visit (HOSPITAL_COMMUNITY)
Admission: RE | Admit: 2023-08-18 | Discharge: 2023-08-18 | Disposition: A | Discharge status: Home / Self Care | Attending: Cardiology | Admitting: Cardiology

## 2023-08-18 ENCOUNTER — Encounter (HOSPITAL_COMMUNITY): Payer: Self-pay | Admitting: Cardiology

## 2023-08-18 DIAGNOSIS — I5022 Chronic systolic (congestive) heart failure: Secondary | ICD-10-CM | POA: Insufficient documentation

## 2023-08-18 DIAGNOSIS — E039 Hypothyroidism, unspecified: Secondary | ICD-10-CM | POA: Diagnosis not present

## 2023-08-18 DIAGNOSIS — I4891 Unspecified atrial fibrillation: Secondary | ICD-10-CM | POA: Diagnosis not present

## 2023-08-18 DIAGNOSIS — Z79899 Other long term (current) drug therapy: Secondary | ICD-10-CM | POA: Insufficient documentation

## 2023-08-18 DIAGNOSIS — I4819 Other persistent atrial fibrillation: Secondary | ICD-10-CM

## 2023-08-18 DIAGNOSIS — I110 Hypertensive heart disease with heart failure: Secondary | ICD-10-CM | POA: Diagnosis not present

## 2023-08-18 DIAGNOSIS — Z95810 Presence of automatic (implantable) cardiac defibrillator: Secondary | ICD-10-CM | POA: Diagnosis not present

## 2023-08-18 DIAGNOSIS — I252 Old myocardial infarction: Secondary | ICD-10-CM | POA: Diagnosis not present

## 2023-08-18 DIAGNOSIS — Z7901 Long term (current) use of anticoagulants: Secondary | ICD-10-CM | POA: Insufficient documentation

## 2023-08-18 DIAGNOSIS — I2510 Atherosclerotic heart disease of native coronary artery without angina pectoris: Secondary | ICD-10-CM | POA: Insufficient documentation

## 2023-08-18 DIAGNOSIS — Z955 Presence of coronary angioplasty implant and graft: Secondary | ICD-10-CM | POA: Diagnosis not present

## 2023-08-18 HISTORY — PX: A-FLUTTER ABLATION: EP1230

## 2023-08-18 HISTORY — PX: ATRIAL FIBRILLATION ABLATION: EP1191

## 2023-08-18 LAB — GLUCOSE, CAPILLARY: Glucose-Capillary: 119 mg/dL — ABNORMAL HIGH (ref 70–99)

## 2023-08-18 LAB — POCT ACTIVATED CLOTTING TIME: Activated Clotting Time: 302 s

## 2023-08-18 SURGERY — ATRIAL FIBRILLATION ABLATION
Anesthesia: General

## 2023-08-18 MED ORDER — HEPARIN SODIUM (PORCINE) 1000 UNIT/ML IJ SOLN
INTRAMUSCULAR | Status: DC | PRN
  Administered 2023-08-18: 10000 [IU] via INTRAVENOUS
  Administered 2023-08-18: 4000 [IU] via INTRAVENOUS

## 2023-08-18 MED ORDER — COLCHICINE 0.6 MG PO TABS
0.6000 mg | ORAL_TABLET | Freq: Two times a day (BID) | ORAL | 0 refills | Status: DC
  Filled 2023-08-18: qty 10, 5d supply, fill #0

## 2023-08-18 MED ORDER — SODIUM CHLORIDE 0.9 % IV SOLN: INTRAVENOUS | Status: DC

## 2023-08-18 MED ORDER — SUGAMMADEX SODIUM 200 MG/2ML IV SOLN
INTRAVENOUS | Status: DC | PRN
  Administered 2023-08-18: 250 mg via INTRAVENOUS

## 2023-08-18 MED ORDER — ATROPINE SULFATE 1 MG/10ML IJ SOSY
PREFILLED_SYRINGE | INTRAMUSCULAR | Status: DC | PRN
  Administered 2023-08-18: 1 mg via INTRAVENOUS

## 2023-08-18 MED ORDER — ROCURONIUM BROMIDE 10 MG/ML (PF) SYRINGE
PREFILLED_SYRINGE | INTRAVENOUS | Status: DC | PRN
  Administered 2023-08-18: 10 mg via INTRAVENOUS
  Administered 2023-08-18: 50 mg via INTRAVENOUS
  Administered 2023-08-18: 10 mg via INTRAVENOUS

## 2023-08-18 MED ORDER — SODIUM CHLORIDE 0.9% FLUSH: 3.0000 mL | Freq: Two times a day (BID) | INTRAVENOUS | Status: DC

## 2023-08-18 MED ORDER — APIXABAN 5 MG PO TABS
5.0000 mg | ORAL_TABLET | Freq: Two times a day (BID) | ORAL | Status: DC
  Administered 2023-08-18: 5 mg via ORAL
  Filled 2023-08-18: qty 1

## 2023-08-18 MED ORDER — PANTOPRAZOLE SODIUM 40 MG PO TBEC
40.0000 mg | DELAYED_RELEASE_TABLET | Freq: Every day | ORAL | 0 refills | Status: DC
  Filled 2023-08-18: qty 45, 45d supply, fill #0

## 2023-08-18 MED ORDER — COLCHICINE 0.6 MG PO TABS
0.6000 mg | ORAL_TABLET | Freq: Two times a day (BID) | ORAL | Status: DC
  Administered 2023-08-18: 0.6 mg via ORAL
  Filled 2023-08-18: qty 1

## 2023-08-18 MED ORDER — VASOPRESSIN 20 UNIT/ML IV SOLN
INTRAVENOUS | Status: AC
  Filled 2023-08-18: qty 1

## 2023-08-18 MED ORDER — ATROPINE SULFATE 1 MG/10ML IJ SOSY
PREFILLED_SYRINGE | INTRAMUSCULAR | Status: AC
Start: 2023-08-18 — End: 2023-08-18
  Filled 2023-08-18: qty 20

## 2023-08-18 MED ORDER — LIDOCAINE 2% (20 MG/ML) 5 ML SYRINGE
INTRAMUSCULAR | Status: DC | PRN
  Administered 2023-08-18: 60 mg via INTRAVENOUS

## 2023-08-18 MED ORDER — ONDANSETRON HCL 4 MG/2ML IJ SOLN
INTRAMUSCULAR | Status: DC | PRN
  Administered 2023-08-18: 4 mg via INTRAVENOUS

## 2023-08-18 MED ORDER — PANTOPRAZOLE SODIUM 40 MG PO TBEC
40.0000 mg | DELAYED_RELEASE_TABLET | Freq: Every day | ORAL | Status: DC
  Administered 2023-08-18: 40 mg via ORAL
  Filled 2023-08-18: qty 1

## 2023-08-18 MED ORDER — SODIUM CHLORIDE 0.9 % IV SOLN: 250.0000 mL | INTRAVENOUS | Status: DC | PRN

## 2023-08-18 MED ORDER — PHENYLEPHRINE HCL-NACL 20-0.9 MG/250ML-% IV SOLN
INTRAVENOUS | Status: DC | PRN
  Administered 2023-08-18: 40 ug/min via INTRAVENOUS

## 2023-08-18 MED ORDER — HEPARIN (PORCINE) IN NACL 1000-0.9 UT/500ML-% IV SOLN
INTRAVENOUS | Status: DC | PRN
Start: 2023-08-18 — End: 2023-08-18
  Administered 2023-08-18 (×3): 500 mL

## 2023-08-18 MED ORDER — SODIUM CHLORIDE 0.9% FLUSH: 3.0000 mL | INTRAVENOUS | Status: DC | PRN

## 2023-08-18 MED ORDER — FENTANYL CITRATE (PF) 100 MCG/2ML IJ SOLN
INTRAMUSCULAR | Status: AC
Start: 2023-08-18 — End: 2023-08-18
  Filled 2023-08-18: qty 2

## 2023-08-18 MED ORDER — DEXAMETHASONE SODIUM PHOSPHATE 10 MG/ML IJ SOLN
INTRAMUSCULAR | Status: DC | PRN
  Administered 2023-08-18: 10 mg via INTRAVENOUS

## 2023-08-18 MED ORDER — HEPARIN SODIUM (PORCINE) 1000 UNIT/ML IJ SOLN
INTRAMUSCULAR | Status: AC
Start: 2023-08-18 — End: 2023-08-18
  Filled 2023-08-18: qty 20

## 2023-08-18 MED ORDER — FENTANYL CITRATE (PF) 250 MCG/5ML IJ SOLN
INTRAMUSCULAR | Status: DC | PRN
  Administered 2023-08-18 (×2): 50 ug via INTRAVENOUS

## 2023-08-18 MED ORDER — PHENYLEPHRINE 80 MCG/ML (10ML) SYRINGE FOR IV PUSH (FOR BLOOD PRESSURE SUPPORT)
PREFILLED_SYRINGE | INTRAVENOUS | Status: DC | PRN
Start: 2023-08-18 — End: 2023-08-18
  Administered 2023-08-18: 160 ug via INTRAVENOUS
  Administered 2023-08-18: 80 ug via INTRAVENOUS

## 2023-08-18 MED ORDER — PROPOFOL 10 MG/ML IV BOLUS
INTRAVENOUS | Status: DC | PRN
  Administered 2023-08-18: 100 mg via INTRAVENOUS

## 2023-08-18 MED ORDER — CEFAZOLIN SODIUM-DEXTROSE 2-3 GM-%(50ML) IV SOLR
INTRAVENOUS | Status: DC | PRN
  Administered 2023-08-18: 2 g via INTRAVENOUS

## 2023-08-18 MED ORDER — PROTAMINE SULFATE 10 MG/ML IV SOLN
INTRAVENOUS | Status: DC | PRN
  Administered 2023-08-18: 35 mg via INTRAVENOUS

## 2023-08-18 MED ORDER — CEFAZOLIN SODIUM-DEXTROSE 2-4 GM/100ML-% IV SOLN
INTRAVENOUS | Status: AC
  Filled 2023-08-18: qty 100

## 2023-08-18 SURGICAL SUPPLY — 18 items
BAG SNAP BAND KOVER 36X36 (MISCELLANEOUS) IMPLANT
CABLE PFA RX CATH CONN (CABLE) IMPLANT
CATH FARAWAVE 2.0 31 (CATHETERS) IMPLANT
CATH GE 8FR SOUNDSTAR (CATHETERS) IMPLANT
CATH OCTARAY 2.0 F 3-3-3-3-3 (CATHETERS) IMPLANT
CLOSURE PERCLOSE PROSTYLE (VASCULAR PRODUCTS) IMPLANT
COVER SWIFTLINK CONNECTOR (BAG) ×1 IMPLANT
DILATOR VESSEL 38 20CM 16FR (INTRODUCER) IMPLANT
GUIDEWIRE INQWIRE 1.5J.035X260 (WIRE) IMPLANT
KIT VERSACROSS CNCT FARADRIVE (KITS) IMPLANT
PACK EP LF (CUSTOM PROCEDURE TRAY) ×1 IMPLANT
PAD DEFIB RADIO PHYSIO CONN (PAD) ×1 IMPLANT
PATCH CARTO3 (PAD) IMPLANT
SHEATH FARADRIVE STEERABLE (SHEATH) IMPLANT
SHEATH PINNACLE 8F 10CM (SHEATH) IMPLANT
SHEATH PINNACLE 9F 10CM (SHEATH) IMPLANT
SHEATH PROBE COVER 6X72 (BAG) IMPLANT
WIRE AMPLATZ SS-J .035X180CM (WIRE) IMPLANT

## 2023-08-18 NOTE — Anesthesia Postprocedure Evaluation (Signed)
 Anesthesia Post Note  Patient: Catherine Mays  Procedure(s) Performed: ATRIAL FIBRILLATION ABLATION A-FLUTTER ABLATION     Patient location during evaluation: PACU Anesthesia Type: General Level of consciousness: awake and alert Pain management: pain level controlled Vital Signs Assessment: post-procedure vital signs reviewed and stable Respiratory status: spontaneous breathing, nonlabored ventilation, respiratory function stable and patient connected to nasal cannula oxygen Cardiovascular status: blood pressure returned to baseline and stable Postop Assessment: no apparent nausea or vomiting Anesthetic complications: no  No notable events documented.  Last Vitals:  Vitals:   08/18/23 1200 08/18/23 1230  BP: 96/71 97/72  Pulse: 73 73  Resp: 18 15  Temp:    SpO2: 94% 93%    Last Pain:  Vitals:   08/18/23 0937  TempSrc: Oral  PainSc: 0-No pain   Pain Goal: Patients Stated Pain Goal: 4 (08/18/23 0543)                 Marien L Caryle Helgeson

## 2023-08-18 NOTE — Discharge Instructions (Signed)

## 2023-08-18 NOTE — Anesthesia Procedure Notes (Signed)
 Arterial Line Insertion Start/End6/24/2025 7:15 AM, 08/18/2023 7:20 AM Performed by: Niels Marien CROME, MD, Christopher Comings, CRNA, CRNA  Patient location: Pre-op . Preanesthetic checklist: patient identified, IV checked, site marked, risks and benefits discussed, surgical consent, monitors and equipment checked, pre-op  evaluation, timeout performed and anesthesia consent Lidocaine  1% used for infiltration Right, radial was placed Catheter size: 20 G Hand hygiene performed , maximum sterile barriers used  and Seldinger technique used Allen's test indicative of satisfactory collateral circulation Attempts: 1 Procedure performed without using ultrasound guided technique. Following insertion, dressing applied. Post procedure assessment: normal and unchanged  Patient tolerated the procedure well with no immediate complications.

## 2023-08-18 NOTE — Anesthesia Preprocedure Evaluation (Addendum)
 Anesthesia Evaluation  Patient identified by MRN, date of birth, ID band Patient awake    Reviewed: Allergy & Precautions, NPO status , Patient's Chart, lab work & pertinent test results, reviewed documented beta blocker date and time   Airway Mallampati: II  TM Distance: >3 FB Neck ROM: Full    Dental no notable dental hx. (+) Teeth Intact, Dental Advisory Given   Pulmonary neg pulmonary ROS   Pulmonary exam normal breath sounds clear to auscultation       Cardiovascular hypertension, Pt. on home beta blockers and Pt. on medications + CAD, + Past MI, + Cardiac Stents and +CHF  Normal cardiovascular exam+ dysrhythmias Atrial Fibrillation + pacemaker + Cardiac Defibrillator + Valvular Problems/Murmurs (severe MR) MR  Rhythm:Regular Rate:Normal  TEE 07/2023 1. Left ventricular ejection fraction, by estimation, is 20 to 25%. The  left ventricle has severely decreased function. The left ventricle  demonstrates global hypokinesis. The left ventricular internal cavity size  was moderately dilated.   2. Right ventricular systolic function is normal. The right ventricular  size is normal. There is moderately elevated pulmonary artery systolic  pressure. The estimated right ventricular systolic pressure is 46.3 mmHg.   3. No left atrial/left atrial appendage thrombus was detected.   4. PISA radius - 1.0 cm      Vena contracta - 0.8 cm      Malcoaptation of mitral valve leaflets due to dilated left ventricle.  The mitral valve is abnormal. Severe mitral valve regurgitation. No  evidence of mitral stenosis.   5. Tricuspid valve regurgitation is severe.   6. The aortic valve is tricuspid. Aortic valve regurgitation is trivial.  No aortic stenosis is present.   7. The inferior vena cava is normal in size with greater than 50%  respiratory variability, suggesting right atrial pressure of 3 mmHg.   8. 3D performed of the LAA and 3D performed  of the mitral valve and  demonstrates No LAA thrombus      Severe mitral regurgitation.     Neuro/Psych  PSYCHIATRIC DISORDERS  Depression    negative neurological ROS     GI/Hepatic Neg liver ROS,GERD  ,,  Endo/Other  Hypothyroidism    Renal/GU Renal InsufficiencyRenal disease  negative genitourinary   Musculoskeletal negative musculoskeletal ROS (+)    Abdominal   Peds  Hematology  (+) Blood dyscrasia (eliquis )   Anesthesia Other Findings   Reproductive/Obstetrics                             Anesthesia Physical Anesthesia Plan  ASA: 4  Anesthesia Plan: General   Post-op Pain Management: Tylenol  PO (pre-op )*   Induction: Intravenous  PONV Risk Score and Plan: 3 and Midazolam , Dexamethasone  and Ondansetron   Airway Management Planned: Oral ETT  Additional Equipment: Arterial line  Intra-op Plan:   Post-operative Plan: Extubation in OR  Informed Consent: I have reviewed the patients History and Physical, chart, labs and discussed the procedure including the risks, benefits and alternatives for the proposed anesthesia with the patient or authorized representative who has indicated his/her understanding and acceptance.     Dental advisory given  Plan Discussed with: CRNA  Anesthesia Plan Comments:        Anesthesia Quick Evaluation

## 2023-08-18 NOTE — Transfer of Care (Signed)
 Immediate Anesthesia Transfer of Care Note  Patient: Catherine Mays  Procedure(s) Performed: ATRIAL FIBRILLATION ABLATION A-FLUTTER ABLATION  Patient Location: PACU  Anesthesia Type:General  Level of Consciousness: oriented, drowsy, and patient cooperative  Airway & Oxygen Therapy: Patient connected to nasal cannula oxygen  Post-op Assessment: Report given to RN and Post -op Vital signs reviewed and stable  Post vital signs: Reviewed and stable  Last Vitals:  Vitals Value Taken Time  BP 92/76 0938  Temp    Pulse 70 0938  Resp 12 0938  SpO2 97% 0938    Last Pain:  Vitals:   08/18/23 0551  TempSrc: Oral  PainSc:       Patients Stated Pain Goal: 4 (08/18/23 0543)  Complications: No notable events documented.

## 2023-08-18 NOTE — Progress Notes (Addendum)
 Up and walked and tolerated well; bilat groins stable, no bleeding or hematoma; ok to d/c home per Dr Cindie

## 2023-08-18 NOTE — Anesthesia Procedure Notes (Signed)
 Procedure Name: Intubation Date/Time: 08/18/2023 7:58 AM  Performed by: Christopher Comings, CRNAPre-anesthesia Checklist: Patient identified, Emergency Drugs available, Suction available and Patient being monitored Patient Re-evaluated:Patient Re-evaluated prior to induction Oxygen Delivery Method: Circle system utilized Preoxygenation: Pre-oxygenation with 100% oxygen Induction Type: IV induction Ventilation: Mask ventilation without difficulty Laryngoscope Size: Mac and 4 Grade View: Grade I Tube type: Oral Tube size: 7.0 mm Number of attempts: 1 Airway Equipment and Method: Stylet and Oral airway Placement Confirmation: ETT inserted through vocal cords under direct vision, positive ETCO2 and breath sounds checked- equal and bilateral Secured at: 22 cm Tube secured with: Tape Dental Injury: Teeth and Oropharynx as per pre-operative assessment

## 2023-08-18 NOTE — H&P (Signed)
 Electrophysiology Office Follow up Visit Note:     Date:  08/18/2023    ID:  Catherine Mays, DOB 1966-02-14, MRN [id number]   PCP:  Catherine Norleen PEDLAR, MD           [facility] Cardiologist:  Lonni Cash, MD  [facility] Electrophysiologist:  OLE ONEIDA HOLTS, MD      Interval History:       Catherine Mays is a 58 y.o. female who presents for a follow up visit.    She has an extensive atrial fibrillation/flutter history.  She is incredibly symptomatic with her arrhythmia and has failed sotalol .  Tikosyn  has not been completely effective either.  Because of rapidly conducted ventricular rates leading to hospitalization, she was upgraded to a biventricular device and an AV nodal ablation was performed.  This is helped but she continues to experience acute on chronic systolic heart failure when in atrial fibrillation.   She is doing well.  She is with her husband today.  She feels better than she has in the past but continues to have some dyspnea with exertion.   Objective Past medical, surgical, social and family history were reviewed.   ROS:   Please see the history of present illness.    All other systems reviewed and are negative.   EKGs/Labs/Other Studies Reviewed:     The following studies were reviewed today:   Jul 16, 2023 in-clinic device interrogation personally reviewed Battery longevity 7 years Presenting rhythm is atrial fibrillation, V sensing at 34 BiV pacing 96% Lead parameter stable         EKG Interpretation Date/Time:                  Thursday Jul 16 2023 15:56:56 EDT Ventricular Rate:         71 PR Interval:                   QRS Duration:             162 QT Interval:                 440 QTC Calculation:479 R Axis:                         184   Text Interpretation:Ventricular-paced rhythm When compared with ECG of 02-Jun-2023 08:29, Confirmed by HOLTS OLE [phone]) on 07/16/2023 4:05:57 PM     Physical Exam:     VS:  BP 95/84 (BP  Location: Right Arm, Patient Position: Sitting, Cuff Size: Normal)   Pulse 86   Ht 5' 9 (1.753 m)   Wt 140 lb 3.2 oz (63.6 kg)   SpO2 96%   BMI 20.70 kg/m         Wt Readings from Last 3 Encounters:  07/16/23 140 lb 3.2 oz (63.6 kg)  06/17/23 148 lb 3.2 oz (67.2 kg)  06/02/23 149 lb 3.2 oz (67.7 kg)      GEN: no distress CARD: RRR, No MRG RESP: No IWOB. CTAB.     Assessment ASSESSMENT:     1. Chronic systolic CHF (congestive heart failure) (HCC)   2. Persistent atrial fibrillation (HCC)   3. Encounter for long-term (current) use of high-risk medication   4. Presence of cardiac resynchronization therapy defibrillator (CRT-D)     PLAN:     In order of problems listed above:   #Persistent atrial fibrillation and flutter The patient has incredibly symptomatic atrial fibrillation flutter.  These have caused acute decompensations of her systolic heart failure.  This is improved somewhat after biventricular upgrade and AV node ablation due to the prevention of rapidly conducted atrial fibrillation but the loss of atrial kick is still leading to worsening of her functional status.   I have previously discussed using an atrial fibrillation ablation in an effort to maintain atrial function and improve cardiac output.  She is interested in proceeding with this.  She understands that this is not a guarantee and she may return to atrial fibrillation or continue to experience heart failure symptoms despite maintenance of normal rhythm   Discussed treatment options today for AF including antiarrhythmic drug therapy and ablation. Discussed risks, recovery and likelihood of success with each treatment strategy. Risk, benefits, and alternatives to EP study and ablation for afib were discussed. These risks include but are not limited to stroke, bleeding, vascular damage, tamponade, perforation, damage to the esophagus, lungs, phrenic nerve and other structures, pulmonary vein stenosis, worsening  renal function, coronary vasospasm and death.  Discussed potential need for repeat ablation procedures and antiarrhythmic drugs after an initial ablation. The patient understands these risk and wishes to proceed.  We will therefore proceed with catheter ablation at the next available time.  Carto, ICE, anesthesia are requested for the procedure.  Will also obtain CT PV protocol prior to the procedure to exclude LAA thrombus and further evaluate atrial anatomy.   #High risk med monitoring-Tikosyn  QTc acceptable for ongoing Tikosyn  use today.   #Chronic systolic heart failure NYHA class III.  Warm and dry on exam.  Follows with the heart failure clinic. Rhythm control indicated.  Continue metoprolol , spironolactone , torsemide .   #CRT-D in situ Device functioning appropriately.  Continue remote monitoring.   Presents for PVI today. Procedure reviewed.  Signed, Ole Holts, MD, [facility], [facility] 08/18/2023 Electrophysiology [facility]

## 2023-08-19 ENCOUNTER — Telehealth (HOSPITAL_COMMUNITY): Payer: Self-pay

## 2023-08-19 NOTE — Telephone Encounter (Signed)
 Spoke with patient to complete post procedure follow up call.  Patient reports no complications with groin sites.   Instructions reviewed with patient:  Remove large bandage at puncture site after 24 hours. It is normal to have bruising, tenderness, mild swelling, and a pea or marble sized lump/knot at the groin site which can take up to three months to resolve.  Get help right away if you notice sudden swelling at the puncture site.  Check your puncture site every day for signs of infection: fever, redness, swelling, pus drainage, warmth, foul odor or excessive pain. If this occurs, please call the office at [phone], to speak with the nurse. Get help right away if your puncture site is bleeding and the bleeding does not stop after applying firm pressure to the area.  You may continue to have skipped beats/ atrial fibrillation during the first several months after your procedure.  It is very important not to miss any doses of your blood thinner Eliquis . Patient restarted taking this medication on 08/18/23.   You will follow up with the Afib clinic on 09/15/23 and follow up with the APP on 11/10/23.   Patient verbalized understanding to all instructions provided and appreciated the call.

## 2023-08-20 ENCOUNTER — Other Ambulatory Visit (HOSPITAL_COMMUNITY): Payer: Self-pay

## 2023-08-20 DIAGNOSIS — I5022 Chronic systolic (congestive) heart failure: Secondary | ICD-10-CM

## 2023-08-20 MED ORDER — ROSUVASTATIN CALCIUM 20 MG PO TABS: 20.0000 mg | ORAL_TABLET | Freq: Every day | ORAL | 1 refills | Status: DC

## 2023-08-21 MED FILL — Fentanyl Citrate Preservative Free (PF) Inj 100 MCG/2ML: INTRAMUSCULAR | Qty: 2 | Status: AC

## 2023-08-21 MED FILL — Vasopressin IV Soln 20 Unit/ML (For IV Infusion): INTRAVENOUS | Qty: 1 | Status: AC

## 2023-09-15 ENCOUNTER — Ambulatory Visit (HOSPITAL_COMMUNITY)
Admission: RE | Admit: 2023-09-15 | Discharge: 2023-09-15 | Disposition: A | Discharge status: Home / Self Care | Source: Ambulatory Visit | Attending: Physician Assistant | Admitting: Physician Assistant

## 2023-09-15 ENCOUNTER — Encounter (HOSPITAL_COMMUNITY): Payer: Self-pay | Admitting: Physician Assistant

## 2023-09-15 VITALS — BP 100/70 | HR 75 | Ht 69.0 in | Wt 138.8 lb

## 2023-09-15 DIAGNOSIS — I480 Paroxysmal atrial fibrillation: Secondary | ICD-10-CM | POA: Diagnosis not present

## 2023-09-15 DIAGNOSIS — Z79899 Other long term (current) drug therapy: Secondary | ICD-10-CM | POA: Diagnosis not present

## 2023-09-15 DIAGNOSIS — Z5181 Encounter for therapeutic drug level monitoring: Secondary | ICD-10-CM | POA: Diagnosis not present

## 2023-09-15 DIAGNOSIS — I130 Hypertensive heart and chronic kidney disease with heart failure and stage 1 through stage 4 chronic kidney disease, or unspecified chronic kidney disease: Secondary | ICD-10-CM | POA: Diagnosis not present

## 2023-09-15 DIAGNOSIS — D6869 Other thrombophilia: Secondary | ICD-10-CM

## 2023-09-15 DIAGNOSIS — Z7901 Long term (current) use of anticoagulants: Secondary | ICD-10-CM | POA: Insufficient documentation

## 2023-09-15 DIAGNOSIS — R9431 Abnormal electrocardiogram [ECG] [EKG]: Secondary | ICD-10-CM | POA: Insufficient documentation

## 2023-09-15 DIAGNOSIS — Z95810 Presence of automatic (implantable) cardiac defibrillator: Secondary | ICD-10-CM | POA: Insufficient documentation

## 2023-09-15 DIAGNOSIS — I4892 Unspecified atrial flutter: Secondary | ICD-10-CM | POA: Insufficient documentation

## 2023-09-15 DIAGNOSIS — I5022 Chronic systolic (congestive) heart failure: Secondary | ICD-10-CM | POA: Diagnosis not present

## 2023-09-15 DIAGNOSIS — I4819 Other persistent atrial fibrillation: Secondary | ICD-10-CM | POA: Diagnosis not present

## 2023-09-15 DIAGNOSIS — I252 Old myocardial infarction: Secondary | ICD-10-CM | POA: Diagnosis not present

## 2023-09-15 DIAGNOSIS — N183 Chronic kidney disease, stage 3 unspecified: Secondary | ICD-10-CM | POA: Insufficient documentation

## 2023-09-15 DIAGNOSIS — I2510 Atherosclerotic heart disease of native coronary artery without angina pectoris: Secondary | ICD-10-CM | POA: Insufficient documentation

## 2023-09-15 DIAGNOSIS — E785 Hyperlipidemia, unspecified: Secondary | ICD-10-CM | POA: Insufficient documentation

## 2023-09-15 NOTE — Progress Notes (Signed)
 Primary Care Physician: Shona Norleen PEDLAR, MD Primary Cardiologist: Lonni Cash, MD Electrophysiologist: OLE ONEIDA HOLTS, MD  Referring Physician: Dr HOLTS [facility]: Dr Rolan Sober Popiel is a 58 y.o. female with a history of CAD (s/p STEMI in 08/2015), chronic HFrEF s/p St. Jude ICD in place (recent malfunctioning of RV ICD lead and replacement along with ICD generator replacement in 03/2022), history of GIB, mitral regurgitation, HLD, Stage 3 CKD and PVC's (prior PVC ablation in 03/2017), atrial fibrillation who presents for follow up in the [facility] Atrial Fibrillation Clinic.    Patient was admitted 01/16/23 with acute on chronic CHF and afib with RVR. Her sotalol  was discontinued with plan for dofetilide  as a bridge to ablation. Echo at that time showed EF 25-30%. Patient is on Eliquis  for a CHADS2VASC score of 3.  She was seen in clinic 02/11/23 with symptoms of tachypalpitations, chest pain, SOB, and a squeezing sensation in her neck. Heart rates were ~150 bpm. She was sent to the ED for urgent DCCV.  Patient returns for follow up for atrial fibrillation and dofetilide  monitoring. Patient has had several ED visits and hospitalizations for CHF and afib since her last visit. She did undergo CRT upgrade and AV nodal ablation on 04/01/23. Despite AV node ablation, she continued to have CHF symptoms when in afib. She underwent afib ablation on 08/18/23. Her device shows < 1% afib burden. Overall she feels well. She did have heavy legs and fatigue over the weekend but interrogation today shows she was in SR. She denies chest pain or groin issues.   Today, she  denies symptoms of palpitations, chest pain, shortness of breath, orthopnea, PND, lower extremity edema, dizziness, presyncope, syncope, snoring, daytime somnolence, bleeding, or neurologic sequela. The patient is tolerating medications without difficulties and is otherwise without complaint today.    Atrial Fibrillation  Risk Factors:  she does not have symptoms or diagnosis of sleep apnea. she does not have a history of rheumatic fever. The patient does have a history of early familial atrial fibrillation or other arrhythmias.  Atrial Fibrillation Management history:  Previous antiarrhythmic drugs: amiodarone , sotalol , dofetilide   Previous cardioversions: 02/11/23, 03/21/23 Previous ablations: AV node 04/01/23, 08/18/23 Anticoagulation history: Eliquis   ROS- All systems are reviewed and negative except as per the HPI above.  Past Medical History:  Diagnosis Date   AICD (automatic cardioverter/defibrillator) present    St. Jude   Anemia    CAD in native artery    a. late recognition of presentation of STEMI 08/2015 s/p DES to LAD, mild residual mRCA.   Chronic systolic CHF (congestive heart failure) (HCC)    CKD (chronic kidney disease), stage III (HCC)    Depression    Hypertension    Hypotension    a. preventing med titration for HF.   Hypothyroidism 09/24/2015   Ischemic cardiomyopathy    Myocardial infarction [facility] 08/2015   PAF (paroxysmal atrial fibrillation) (HCC) 09/24/2015   a. dx at time of STEMI 08/2015.   Pre-diabetes    Presence of permanent cardiac pacemaker    patient has ICD   PVC's (premature ventricular contractions)     Current Outpatient Medications  Medication Sig Dispense Refill   acetaminophen  (TYLENOL ) 500 MG tablet Take 1,000 mg by mouth every 6 (six) hours as needed for mild pain.     apixaban  (ELIQUIS ) 5 MG TABS tablet Take 1 tablet (5 mg total) by mouth 2 (two) times daily. Resume Sunday 2/9  buPROPion  (WELLBUTRIN  XL) 300 MG 24 hr tablet Take 300 mg by mouth daily.     Calcium  Carb-Cholecalciferol (CALCIUM  + D3 PO) Take 1,200 mg by mouth every morning. Slow release     Coenzyme Q10 (COQ-10) 200 MG CAPS Take 200 mg by mouth daily.     dofetilide  (TIKOSYN ) 250 MCG capsule TAKE 1 CAPSULE BY MOUTH TWICE DAILY 180 capsule 3   ferrous sulfate  325 (65 FE) MG EC tablet  Take 325 mg by mouth daily.     fexofenadine (ALLEGRA) 180 MG tablet Take 180 mg by mouth daily.     metoprolol  succinate (TOPROL -XL) 25 MG 24 hr tablet TAKE 1 TABLET BY MOUTH EVERY NIGHT AT BEDTIME 30 tablet 5   mometasone (NASONEX) 50 MCG/ACT nasal spray Place 2 sprays into the nose daily.     nitroGLYCERIN  (NITROSTAT ) 0.4 MG SL tablet Place 1 tablet (0.4 mg total) under the tongue every 5 (five) minutes as needed for chest pain. 25 tablet 3   pantoprazole  (PROTONIX ) 40 MG tablet Take 1 tablet (40 mg total) by mouth daily. No refills necessary, post procedure medication. 45 tablet 0   rosuvastatin  (CRESTOR ) 20 MG tablet Take 1 tablet (20 mg total) by mouth daily. 90 tablet 1   sertraline  (ZOLOFT ) 50 MG tablet Take 50 mg by mouth daily.     spironolactone  (ALDACTONE ) 25 MG tablet TAKE 1 TABLET(25 MG) BY MOUTH DAILY 30 tablet 11   SYNTHROID  125 MCG tablet Take 125 mcg by mouth every morning.     torsemide  (DEMADEX ) 20 MG tablet Take 60mg  (3) tablets daily, alternating with 40mg  (2) tablets every other day 72 tablet 5   traZODone  (DESYREL ) 50 MG tablet Take 25 mg by mouth at bedtime.     colchicine  0.6 MG tablet Take 1 tablet (0.6 mg total) by mouth 2 (two) times daily for 5 days. No refills necessary, post procedure medication. 10 tablet 0   [Paused] dexlansoprazole  (DEXILANT ) 60 MG capsule TAKE 1 CAPSULE(60 MG) BY MOUTH DAILY 90 capsule 3   No current facility-administered medications for this encounter.    Physical Exam: BP 100/70   Pulse 75   Ht 5' 9 (1.753 m)   Wt 63 kg   BMI 20.50 kg/m   GEN: Well nourished, well developed in no acute distress CARDIAC: Regular rate and rhythm, no murmurs, rubs, gallops RESPIRATORY:  Clear to auscultation without rales, wheezing or rhonchi  ABDOMEN: Soft, non-tender, non-distended EXTREMITIES:  No edema; No deformity    Wt Readings from Last 3 Encounters:  09/15/23 63 kg  08/18/23 62.6 kg  08/11/23 61.7 kg     EKG today demonstrates  AV  dual paced rhythm  Vent. rate 75 BPM PR interval 202 ms QRS duration 138 ms QT/QTcB 478/533 ms   Echo 11/20/22 demonstrated   1. The anterior, apical, inferoapical and anteroseptal walls are thinned  and akinetic. No LV thrombus by Definity  contrast. Left ventricular  ejection fraction, by estimation, is 25 to 30%. The left ventricle has  severely decreased function. The left ventricle demonstrates regional wall motion abnormalities (see scoring diagram/findings for description). The left ventricular internal cavity size was mildly dilated. Left ventricular diastolic parameters are consistent with Grade I diastolic dysfunction (impaired relaxation).   2. Right ventricular systolic function is mildly reduced. The right  ventricular size is normal. There is normal pulmonary artery systolic  pressure. The estimated right ventricular systolic pressure is 28.8 mmHg.   3. Left atrial size was moderately dilated.  4. The mitral valve is abnormal. Mild mitral valve regurgitation.   5. The aortic valve is tricuspid. Aortic valve regurgitation is not  visualized.   6. The inferior vena cava is normal in size with greater than 50%  respiratory variability, suggesting right atrial pressure of 3 mmHg.   Comparison(s): No significant change from prior study. 05/08/2021: LVEF  25-30%, akinesis inferoseptal/anteroseptal, apical lateral wall and apex.    CHA2DS2-VASc Score = 3  The patient's score is based upon: CHF History: 1 HTN History: 0 Diabetes History: 0 Stroke History: 0 Vascular Disease History: 1 Age Score: 0 Gender Score: 1       ASSESSMENT AND PLAN: Persistent Atrial Fibrillation/atrial flutter (ICD10:  I48.19) The patient's CHA2DS2-VASc score is 3, indicating a 3.2% annual risk of stroke.   Previously failed amiodarone  (side effects) and sotalol . Had breakthrough episodes on dofetilide . 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 S/p afib ablation 08/18/23 Patient appears to be maintaining SR,  device shows < 1% afib burden. Continue dofetilide  250 mcg BID Continue Eliquis  5 mg BID with no missed doses for 3 months post ablation. Continue Toprol  25 mg daily  Secondary Hypercoagulable State (ICD10:  D68.69) The patient is at significant risk for stroke/thromboembolism based upon her CHA2DS2-VASc Score of 3.  Continue Apixaban  (Eliquis ). No bleeding issues.   High Risk Medication Monitoring (ICD 10: Z79.899) QT interval on ECG acceptable for dofetilide  monitoring. Check bmet/mag today.     CAD S/p STEMI 2017 No anginal symptoms  Chronic HFrEF EF 20-25%, ICM S/p ICD, followed by Dr Cindie GDMT per [facility] team Fluid status appears stable today    Follow up with Daphne Barrack as scheduled.       Daril Kicks PA-C Afib Clinic [facility] [address] [place], [place] [address] [phone]

## 2023-09-16 ENCOUNTER — Ambulatory Visit (HOSPITAL_COMMUNITY): Payer: Self-pay | Admitting: Physician Assistant

## 2023-09-16 LAB — MAGNESIUM: Magnesium: 2 mg/dL (ref 1.6–2.3)

## 2023-09-16 LAB — BASIC METABOLIC PANEL WITH GFR
BUN/Creatinine Ratio: 22 (ref 9–23)
BUN: 32 mg/dL — ABNORMAL HIGH (ref 6–24)
CO2: 24 mmol/L (ref 20–29)
Calcium: 9.4 mg/dL (ref 8.7–10.2)
Chloride: 97 mmol/L (ref 96–106)
Creatinine, Ser: 1.44 mg/dL — ABNORMAL HIGH (ref 0.57–1.00)
Glucose: 150 mg/dL — ABNORMAL HIGH (ref 70–99)
Potassium: 3.8 mmol/L (ref 3.5–5.2)
Sodium: 141 mmol/L (ref 134–144)
eGFR: 42 mL/min/1.73 — ABNORMAL LOW (ref >59)

## 2023-09-18 ENCOUNTER — Other Ambulatory Visit (HOSPITAL_COMMUNITY): Payer: Self-pay | Admitting: Physician Assistant

## 2023-09-18 MED ORDER — POTASSIUM CHLORIDE ER 10 MEQ PO TBCR: 10.0000 meq | EXTENDED_RELEASE_TABLET | Freq: Every day | ORAL | 1 refills | Status: DC

## 2023-09-21 NOTE — Progress Notes (Signed)
 Remote ICD transmission.

## 2023-09-24 ENCOUNTER — Encounter (HOSPITAL_COMMUNITY): Payer: Self-pay | Admitting: Hematology

## 2023-09-29 ENCOUNTER — Encounter: Payer: Self-pay | Admitting: Internal Medicine

## 2023-09-30 ENCOUNTER — Other Ambulatory Visit (HOSPITAL_COMMUNITY): Payer: Self-pay | Admitting: Cardiology

## 2023-10-15 ENCOUNTER — Ambulatory Visit (INDEPENDENT_AMBULATORY_CARE_PROVIDER_SITE_OTHER): Admitting: Internal Medicine

## 2023-10-15 ENCOUNTER — Telehealth: Payer: Self-pay | Admitting: *Deleted

## 2023-10-15 VITALS — BP 98/66 | HR 72 | Temp 98.6°F | Ht 69.0 in | Wt 134.6 lb

## 2023-10-15 DIAGNOSIS — Z1211 Encounter for screening for malignant neoplasm of colon: Secondary | ICD-10-CM

## 2023-10-15 DIAGNOSIS — R142 Eructation: Secondary | ICD-10-CM

## 2023-10-15 DIAGNOSIS — R1310 Dysphagia, unspecified: Secondary | ICD-10-CM | POA: Diagnosis not present

## 2023-10-15 DIAGNOSIS — R1319 Other dysphagia: Secondary | ICD-10-CM

## 2023-10-15 DIAGNOSIS — K219 Gastro-esophageal reflux disease without esophagitis: Secondary | ICD-10-CM

## 2023-10-15 NOTE — Progress Notes (Signed)
 Primary Care Physician:  Hyacinth Honey, NP Primary Gastroenterologist:  Dr. Cindie  Chief Complaint  Patient presents with   New Patient (Initial Visit)    Pt referred for GERD    HPI:   Catherine Mays is a 58 y.o. female who presents to the clinic today by referral from her PCP Honey Hyacinth for evaluation.  Patient reports chronic GERD for many years.  Previously on Dexilant  60 mg daily with good control.  After recent cardiac ablation, was switched to pantoprazole  as well as started on potassium.  This flared her symptoms significantly.  Notes epigastric discomfort and pain.  Also associated belching.  She was recently switched back to Dexilant  approximately 1 month ago and her pain has resolved.  However she continues to have progressively worsening esophageal dysphagia.  Feels like food will get stuck in her substernal region.  At times want to stop eating and drink a lot of water  to get the food to eventually pass.  No melena hematochezia.  No chronic NSAID use.  Does have a significant cardiac history with EF of 20-25%.  STEMI 2017.  Also has atrial fibrillation chronically on Eliquis .  Last colonoscopy 2021 unremarkable with 10-year recall.  No family history of colorectal malignancy.  No unintentional weight loss.  Bowels moving well, denies any chronic diarrhea or constipation.   Past Medical History:  Diagnosis Date   AICD (automatic cardioverter/defibrillator) present    [employer]   Anemia    CAD in native artery    a. late recognition of presentation of STEMI 08/2015 s/p DES to LAD, mild residual mRCA.   Chronic systolic CHF (congestive heart failure) (HCC)    CKD (chronic kidney disease), stage III (HCC)    Depression    Hypertension    Hypotension    a. preventing med titration for HF.   Hypothyroidism 09/24/2015   Ischemic cardiomyopathy    Myocardial infarction [facility]) 08/2015   PAF (paroxysmal atrial fibrillation) (HCC) 09/24/2015   a. dx at time of STEMI  08/2015.   Pre-diabetes    Presence of permanent cardiac pacemaker    patient has ICD   PVC's (premature ventricular contractions)     Past Surgical History:  Procedure Laterality Date   A-FLUTTER ABLATION N/A 08/18/2023   Procedure: A-FLUTTER ABLATION;  Surgeon: Cindie Ole DASEN, MD;  Location: [facility] INVASIVE CV LAB;  Service: Cardiovascular;  Laterality: N/A;   ATRIAL FIBRILLATION ABLATION N/A 08/18/2023   Procedure: ATRIAL FIBRILLATION ABLATION;  Surgeon: Cindie Ole DASEN, MD;  Location: [facility] INVASIVE CV LAB;  Service: Cardiovascular;  Laterality: N/A;   AV NODE ABLATION N/A 04/01/2023   Procedure: AV NODE ABLATION;  Surgeon: Waddell Danelle ORN, MD;  Location: [facility] INVASIVE CV LAB;  Service: Cardiovascular;  Laterality: N/A;   BIOPSY  06/16/2019   Procedure: BIOPSY;  Surgeon: Shaaron Lamar HERO, MD;  Location: [facility] ENDO SU[facility];  Service: Endoscopy;;   BIV UPGRADE N/A 03/30/2023   Procedure: BIV VALERI;  Surgeon: Cindie Ole DASEN, MD;  Location: [facility] INVASIVE CV LAB;  Service: Cardiovascular;  Laterality: N/A;   CARDIAC CATHETERIZATION N/A 09/20/2015   Procedure: Left Heart Cath and Coronary Angiography;  Surgeon: Lonni JONETTA Cash, MD;  Location: [facility] INVASIVE CV LAB;  Service: Cardiovascular;  Laterality: N/A;   CARDIAC CATHETERIZATION N/A 09/20/2015   Procedure: Coronary Stent Intervention;  Surgeon: Lonni JONETTA Cash, MD;  Location: [facility] INVASIVE CV LAB;  Service: Cardiovascular;  Laterality: N/A;   CARDIAC CATHETERIZATION N/A 09/21/2015   Procedure: Left  Heart Cath and Coronary Angiography;  Surgeon: Ozell Fell, MD;  Location: [facility] INVASIVE CV LAB;  Service: Cardiovascular;  Laterality: N/A;   CARDIAC CATHETERIZATION N/A 11/26/2015   Procedure: Right Heart Cath;  Surgeon: Ezra GORMAN Shuck, MD;  Location: [facility] INVASIVE CV LAB;  Service: Cardiovascular;  Laterality: N/A;   CARDIOVERSION N/A 03/27/2023   Procedure: CARDIOVERSION;  Surgeon: Shlomo Wilbert SAUNDERS, MD;  Location: [facility] INVASIVE CV LAB;  Service:  Cardiovascular;  Laterality: N/A;   COLONOSCOPY WITH PROPOFOL  N/A 11/23/2017   Dr. Shaaron: Diverticulosis, internal hemorrhoids, next colonoscopy in 10 years   COLONOSCOPY WITH PROPOFOL  N/A 08/10/2019   Procedure: COLONOSCOPY WITH PROPOFOL ;  Surgeon: Shaaron Lamar HERO, MD;  Diverticulosis in sigmoid colon, nonbleeding internal hemorrhoids, otherwise normal exam.     COLONOSCOPY WITH PROPOFOL  N/A 12/02/2020   Procedure: COLONOSCOPY WITH PROPOFOL ;  Surgeon: Rollin Dover, MD;  Location: [facility] ENDOSCOPY;  Service: Endoscopy;  Laterality: N/A;   CORONARY STENT PLACEMENT  09/20/2015   EP IMPLANTABLE DEVICE N/A 01/23/2016   Procedure: ICD Implant;  Surgeon: Will Gladis Norton, MD;  Location: [facility] INVASIVE CV LAB;  Service: Cardiovascular;  Laterality: N/A;   ESOPHAGOGASTRODUODENOSCOPY (EGD) WITH PROPOFOL  N/A 06/16/2019   Procedure: ESOPHAGOGASTRODUODENOSCOPY (EGD) WITH PROPOFOL ;  Surgeon: Shaaron Lamar HERO, MD;  Normal esophagus, small hiatal hernia, normal examined stomach, normal examined duodenum.  Gastric biopsy with slight chronic inflammation, duodenal biopsy with slight intramucosal Brunner gland hyperplasia.   ESOPHAGOGASTRODUODENOSCOPY (EGD) WITH PROPOFOL  N/A 11/30/2020   Procedure: ESOPHAGOGASTRODUODENOSCOPY (EGD) WITH PROPOFOL ;  Surgeon: Stacia Glendia BRAVO, MD;  Location: [facility] ENDOSCOPY;  Service: Gastroenterology;  Laterality: N/A;   GIVENS CAPSULE STUDY N/A 11/23/2019    Surgeon: Shaaron Lamar HERO, MD; essentially unremarkable except for tiny erosion in the proximal small bowel.   GIVENS CAPSULE STUDY  11/30/2020   Procedure: GIVENS CAPSULE STUDY;  Surgeon: Stacia Glendia BRAVO, MD;  Location: [facility] ENDOSCOPY;  Service: Gastroenterology;;   ICD GENERATOR CHANGEOUT N/A 04/14/2022   Procedure: ICD GENERATOR CHANGEOUT;  Surgeon: Cindie Ole DASEN, MD;  Location: [facility] INVASIVE CV LAB;  Service: Cardiovascular;  Laterality: N/A;   LEAD EXTRACTION N/A 04/14/2022   Procedure: LEAD EXTRACTION;  Surgeon: Cindie Ole DASEN, MD;  Location: [facility] INVASIVE CV LAB;  Service: Cardiovascular;  Laterality: N/A;   LEFT HEART CATH AND CORONARY ANGIOGRAPHY N/A 12/18/2017   Procedure: LEFT HEART CATH AND CORONARY ANGIOGRAPHY;  Surgeon: Shuck Ezra GORMAN, MD;  Location: [facility] INVASIVE CV LAB;  Service: Cardiovascular;  Laterality: N/A;   PVC ABLATION N/A 04/02/2017   Procedure: PVC ABLATION;  Surgeon: Norton Soyla Gladis, MD;  Location: [facility] INVASIVE CV LAB;  Service: Cardiovascular;  Laterality: N/A;   TEE WITHOUT CARDIOVERSION N/A 10/08/2016   Procedure: TRANSESOPHAGEAL ECHOCARDIOGRAM (TEE);  Surgeon: Shuck Ezra GORMAN, MD;  Location: [facility] ENDOSCOPY;  Service: Cardiovascular;  Laterality: N/A;   THYROIDECTOMY     TRANSESOPHAGEAL ECHOCARDIOGRAM (CATH LAB) N/A 03/27/2023   Procedure: TRANSESOPHAGEAL ECHOCARDIOGRAM;  Surgeon: Shlomo Wilbert SAUNDERS, MD;  Location: [facility] INVASIVE CV LAB;  Service: Cardiovascular;  Laterality: N/A;   TRANSESOPHAGEAL ECHOCARDIOGRAM (CATH LAB) N/A 08/11/2023   Procedure: TRANSESOPHAGEAL ECHOCARDIOGRAM;  Surgeon: Jeffrie Oneil BROCKS, MD;  Location: [facility] INVASIVE CV LAB;  Service: Cardiovascular;  Laterality: N/A;    Current Outpatient Medications  Medication Sig Dispense Refill   acetaminophen  (TYLENOL ) 500 MG tablet Take 1,000 mg by mouth every 6 (six) hours as needed for mild pain.     apixaban  (ELIQUIS ) 5 MG TABS tablet Take 1 tablet (5 mg total) by mouth  2 (two) times daily. Resume Sunday 2/9     buPROPion  (WELLBUTRIN  XL) 300 MG 24 hr tablet Take 300 mg by mouth daily.     Calcium  Carb-Cholecalciferol (CALCIUM  + D3 PO) Take 1,200 mg by mouth every morning. Slow release     Coenzyme Q10 (COQ-10) 200 MG CAPS Take 200 mg by mouth daily.     [Paused] dexlansoprazole  (DEXILANT ) 60 MG capsule TAKE 1 CAPSULE(60 MG) BY MOUTH DAILY 90 capsule 3   dofetilide  (TIKOSYN ) 250 MCG capsule TAKE 1 CAPSULE BY MOUTH TWICE DAILY 180 capsule 3   ferrous sulfate  325 (65 FE) MG EC tablet Take 325 mg by mouth daily.     fexofenadine (ALLEGRA)  180 MG tablet Take 180 mg by mouth daily.     metoprolol  succinate (TOPROL -XL) 25 MG 24 hr tablet TAKE 1 TABLET BY MOUTH EVERY NIGHT AT BEDTIME 30 tablet 5   mometasone (NASONEX) 50 MCG/ACT nasal spray Place 2 sprays into the nose daily.     nitroGLYCERIN  (NITROSTAT ) 0.4 MG SL tablet DISSOLVE 1 TABLET UNDER THE TONGUE EVERY 5 MINUTES AS NEEDED FOR CHEST PAIN 25 tablet 0   rosuvastatin  (CRESTOR ) 20 MG tablet Take 1 tablet (20 mg total) by mouth daily. 90 tablet 1   sertraline  (ZOLOFT ) 50 MG tablet Take 50 mg by mouth daily.     spironolactone  (ALDACTONE ) 25 MG tablet TAKE 1 TABLET(25 MG) BY MOUTH DAILY 30 tablet 11   SYNTHROID  125 MCG tablet Take 125 mcg by mouth every morning.     torsemide  (DEMADEX ) 20 MG tablet Take 60mg  (3) tablets daily, alternating with 40mg  (2) tablets every other day 72 tablet 5   traZODone  (DESYREL ) 50 MG tablet Take 25 mg by mouth at bedtime.     colchicine  0.6 MG tablet Take 1 tablet (0.6 mg total) by mouth 2 (two) times daily for 5 days. No refills necessary, post procedure medication. (Patient not taking: Reported on 10/15/2023) 10 tablet 0   pantoprazole  (PROTONIX ) 40 MG tablet Take 1 tablet (40 mg total) by mouth daily. No refills necessary, post procedure medication. (Patient not taking: Reported on 10/15/2023) 45 tablet 0   potassium chloride  (KLOR-CON ) 10 MEQ tablet TAKE 1 TABLET(10 MEQ) BY MOUTH DAILY (Patient not taking: Reported on 10/15/2023) 90 tablet 2   No current facility-administered medications for this visit.    Allergies as of 10/15/2023 - Review Complete 10/15/2023  Allergen Reaction Noted   Paxil [paroxetine] Palpitations 11/23/2015   Amiodarone  Nausea And Vomiting 02/03/2023   Chlorhexidine  gluconate Itching 04/14/2022   Morphine  and codeine Nausea And Vomiting 11/23/2015   Percocet [oxycodone -acetaminophen ] Nausea And Vomiting 11/23/2015   Cefdinir Nausea Only 11/23/2015   Zolpidem  Other (See Comments) 11/23/2015    Family History  Problem  Relation Age of Onset   Arrhythmia Mother    Lung cancer Mother        bronchiectasis   Heart failure Father    Heart attack Father    Heart attack Brother    Arrhythmia Brother    Colon cancer Neg Hx    Colon polyps Neg Hx     Social History   Socioeconomic History   Marital status: Married    Spouse name: Not on file   Number of children: Not on file   Years of education: Not on file   Highest education level: Not on file  Occupational History   Not on file  Tobacco Use   Smoking status: Never   Smokeless tobacco: Never  Tobacco comments:    Never smoked 01/21/23  Vaping Use   Vaping status: Never Used  Substance and Sexual Activity   Alcohol use: No   Drug use: No   Sexual activity: Not on file  Other Topics Concern   Not on file  Social History Narrative   Not on file   Social Drivers of Health   Financial Resource Strain: Low Risk  (02/10/2020)   Overall Financial Resource Strain (CARDIA)    Difficulty of Paying Living Expenses: Not hard at all  Food Insecurity: No Food Insecurity (03/27/2023)   Hunger Vital Sign    Worried About Running Out of Food in the Last Year: Never true    Ran Out of Food in the Last Year: Never true  Transportation Needs: No Transportation Needs (03/27/2023)   PRAPARE - Administrator, Civil Service (Medical): No    Lack of Transportation (Non-Medical): No  Physical Activity: Insufficiently Active (02/10/2020)   Exercise Vital Sign    Days of Exercise per Week: 3 days    Minutes of Exercise per Session: 30 min  Stress: No Stress Concern Present (02/10/2020)   [employer] of Occupational Health - Occupational Stress Questionnaire    Feeling of Stress : Not at all  Social Connections: Socially Integrated (03/27/2023)   Social Connection and Isolation Panel    Frequency of Communication with Friends and Family: Three times a week    Frequency of Social Gatherings with Friends and Family: Three times a week     Attends Religious Services: More than 4 times per year    Active Member of Clubs or Organizations: Yes    Attends Banker Meetings: More than 4 times per year    Marital Status: Married  Catering manager Violence: Not At Risk (03/27/2023)   Humiliation, Afraid, Rape, and Kick questionnaire    Fear of Current or Ex-Partner: No    Emotionally Abused: No    Physically Abused: No    Sexually Abused: No    Subjective: Review of Systems  Constitutional:  Negative for chills and fever.  HENT:  Negative for congestion and hearing loss.   Eyes:  Negative for blurred vision and double vision.  Respiratory:  Negative for cough and shortness of breath.   Cardiovascular:  Negative for chest pain and palpitations.  Gastrointestinal:  Positive for heartburn. Negative for abdominal pain, blood in stool, constipation, diarrhea, melena and vomiting.       Dysphagia  Genitourinary:  Negative for dysuria and urgency.  Musculoskeletal:  Negative for joint pain and myalgias.  Skin:  Negative for itching and rash.  Neurological:  Negative for dizziness and headaches.  Psychiatric/Behavioral:  Negative for depression. The patient is not nervous/anxious.        Objective: BP 98/66   Pulse 72   Temp 98.6 F (37 C)   Ht 5' 9 (1.753 m)   Wt 134 lb 9.6 oz (61.1 kg)   BMI 19.88 kg/m  Physical Exam Constitutional:      Appearance: Normal appearance.  HENT:     Head: Normocephalic and atraumatic.  Eyes:     Extraocular Movements: Extraocular movements intact.     Conjunctiva/sclera: Conjunctivae normal.  Cardiovascular:     Rate and Rhythm: Normal rate and regular rhythm.  Pulmonary:     Effort: Pulmonary effort is normal.     Breath sounds: Normal breath sounds.  Abdominal:     General: Bowel sounds are normal.  Palpations: Abdomen is soft.  Musculoskeletal:        General: No swelling. Normal range of motion.     Cervical back: Normal range of motion and neck supple.   Skin:    General: Skin is warm and dry.     Coloration: Skin is not jaundiced.  Neurological:     General: No focal deficit present.     Mental Status: She is alert and oriented to person, place, and time.  Psychiatric:        Mood and Affect: Mood normal.        Behavior: Behavior normal.      Assessment: *Chronic GERD- improved on Dexlansoprazole  60 mg daily *Esophageal dysphagia-chronic, worsening *Belching *Colon cancer screening  Plan: Discussed in depth with patient and husband today.  Would recommend upper endoscopy with possible esophageal dilation to further evaluate her symptoms.  Given her significant cardiac history, we will reach out to her cardiologist for clearance as well as clearance to hold her Eliquis  x 2 days.  Appreciate their help immensely.  Continue on Dexilant  60 mg daily.  Colonoscopy recall 2031 for colon cancer screening purposes.  Thank you Carliss Batty for the kind referral.  10/15/2023 1:58 PM   Disclaimer: This note was dictated with voice recognition software. Similar sounding words can inadvertently be transcribed and may not be corrected upon review.

## 2023-10-15 NOTE — Telephone Encounter (Signed)
  Request for patient to stop medication prior to procedure or is needing cleareance  10/15/23  Catherine Mays June 07, 1965  What type of surgery is being performed? EGD/DILATION  When is surgery scheduled? TBD  What type of clearance is required (medical or pharmacy to hold medication or both? BOTH  Are there any medications that need to be held prior to surgery and how long? ELIQUIS  X 2 DAYS PRIOR  Name of physician performing surgery?  Dr. Cindie Rouse [facility] at [facility] Phone: [phone], option 5 Fax: [phone]  Anesthesia type (none, local, MAC, general)? MAC

## 2023-10-15 NOTE — Patient Instructions (Signed)
 We will reach out to your cardiologist in regards to possible upper endoscopy with esophageal dilation.  You also need to hold your Eliquis  x 2 days prior to procedure.  Once we have heard back we will call and let you know.  In the meantime, would continue on Dexilant  60 mg daily.  It was very nice meeting both of you today.  Dr. Cindie

## 2023-10-16 NOTE — Telephone Encounter (Signed)
   Name:  Catherine Mays  DOB:  August 13, 1965  MRN:  [id number]   Primary Cardiologist: Lonni Cash, MD  Chart reviewed as part of pre-operative protocol coverage. Patient was contacted 10/16/2023 in reference to pre-operative risk assessment for pending surgery as outlined below.  Shahad Mazurek was last seen on 09/15/2023 by Quita Kicks, PA.    Persistent Atrial Fibrillation/atrial flutter (ICD10:  I48.19) The patient's CHA2DS2-VASc score is 3, indicating a 3.2% annual risk of stroke.   Previously failed amiodarone  (side effects) and sotalol . Had breakthrough episodes on dofetilide . 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 S/p afib ablation 08/18/23 Patient appears to be maintaining SR, device shows < 1% afib burden. Continue dofetilide  250 mcg BID Continue Eliquis  5 mg BID with no missed doses for 3 months post ablation.  Due to time of recent ablation patient cannot hold Eliquis  until November 2025. Pre-op  covering staff: - Please schedule appointment and call patient to inform them. If patient already had an upcoming appointment within acceptable timeframe, please add pre-op  clearance to the appointment notes so provider is aware. Sees Daphne Barrack on 11/10/2023 but will still need to stay on Eliquis  until Nov. Please inform GI that a second request will need to be sent for clearance, and pharmacy will weigh in at that time.  - Please contact requesting surgeon's office via preferred method (i.e, phone, fax) to inform them of need for appointment prior to surgery.  Lamarr Satterfield, NP 10/16/2023, 9:18 AM

## 2023-10-19 NOTE — Telephone Encounter (Signed)
 Pt made aware of message regarding clearance. She states she will call back around November.

## 2023-10-19 NOTE — Telephone Encounter (Signed)
 Dr. Cindie is aware. Will readdress in November Called pt, LMOVM to call back to make aware.

## 2023-10-20 NOTE — Telephone Encounter (Signed)
 Patient's ablation was 08/18/23, so 3 months of anticoagulation would end 11/18/23. If patient wishes to proceed and is not having any concerning symptoms at time of office visit with Daphne Barrack, NP, I believe it would be okay to do so, correct?

## 2023-10-20 NOTE — Telephone Encounter (Signed)
 Patient with diagnosis of afib on Eliquis  for anticoagulation.    Procedure:  EGD/DILATION  Date of procedure: TBD   CHA2DS2-VASc Score = 3   This indicates a 3.2% annual risk of stroke. The patient's score is based upon: CHF History: 1 HTN History: 0 Diabetes History: 0 Stroke History: 0 Vascular Disease History: 1 Age Score: 0 Gender Score: 1      CrCl 39 ml.min Platelet count 150  Ablation 08/18/23. Patient may have procedure anytime AFTER 11/18/23 as she needs to complete 90 days of uninterrupted anticoagulation.  Per office protocol, patient can hold Eliquis  for 2 days prior to procedure as long as procedure is scheduled AFTER 11/18/23  **This guidance is not considered finalized until pre-operative APP has relayed final recommendations.**

## 2023-10-21 NOTE — Telephone Encounter (Signed)
   Name: Catherine Mays  DOB: 25-Jan-1966  MRN: [id number]  Primary Cardiologist: Lonni Cash, MD  Chart reviewed as part of pre-operative protocol coverage. The patient has an upcoming visit scheduled with Daphne Barrack, NP on 11/10/2023 at which time clearance can be addressed in case there are any issues that would impact surgical recommendations.   I added preop FYI to appointment note so that provider is aware to address at time of outpatient visit.  Per office protocol the cardiology provider should forward their finalized clearance decision and recommendations regarding antiplatelet therapy to the requesting party below.    Ablation 08/18/23. Patient may have procedure anytime AFTER 11/18/23 as she needs to complete 90 days of uninterrupted anticoagulation.   Per office protocol, patient can hold Eliquis  for 2 days prior to procedure as long as procedure is scheduled AFTER 11/18/23   I will route this message as FYI to requesting party and remove this message from the preop box as separate preop APP input not needed at this time.   Please call with any questions.  Wyn Raddle, Jackee Shove, NP  10/21/2023, 9:03 AM

## 2023-10-23 ENCOUNTER — Other Ambulatory Visit: Payer: Self-pay

## 2023-10-23 ENCOUNTER — Telehealth: Payer: Self-pay

## 2023-10-23 DIAGNOSIS — R1319 Other dysphagia: Secondary | ICD-10-CM

## 2023-10-23 DIAGNOSIS — R142 Eructation: Secondary | ICD-10-CM

## 2023-10-23 DIAGNOSIS — K219 Gastro-esophageal reflux disease without esophagitis: Secondary | ICD-10-CM

## 2023-10-23 MED ORDER — ESOMEPRAZOLE MAGNESIUM 40 MG PO CPDR: 40.0000 mg | DELAYED_RELEASE_CAPSULE | Freq: Two times a day (BID) | ORAL | 0 refills | Status: DC

## 2023-10-23 NOTE — Telephone Encounter (Signed)
 Pt phoned and LM stating her Dexilant  was not working and she needed something else or any suggestions. Pt was seen 10/15/2023 and was switched from pantoprazole  then. Sent message to Dr Cindie secure chat. Waiting for a response

## 2023-10-23 NOTE — Telephone Encounter (Signed)
 FYI:  Phoned and spoke with the pt and was advised that the EGD could not be done until November due to the ablation she had and the blood thinner she is on. Advised pt we have not heard from her Cardiologist yet but when we do we will touch base with her. Advised of Esomeprazole  40 mg will be sent to her pharmacy in [place] on [address]  Rx sent in. Pt advised.

## 2023-10-23 NOTE — Telephone Encounter (Signed)
 Please send in esomeprazole  40 mg bid  CC She needs EGD, not sure if we have heard back or not from her cardiologist yet

## 2023-10-28 ENCOUNTER — Ambulatory Visit (INDEPENDENT_AMBULATORY_CARE_PROVIDER_SITE_OTHER)

## 2023-10-28 DIAGNOSIS — I480 Paroxysmal atrial fibrillation: Secondary | ICD-10-CM

## 2023-10-29 LAB — CUP PACEART REMOTE DEVICE CHECK
Battery Remaining Longevity: 80 mo
Battery Remaining Percentage: 90 %
Battery Voltage: 2.99 V
Brady Statistic AP VP Percent: 74 %
Brady Statistic AP VS Percent: 1.2 %
Brady Statistic AS VP Percent: 24 %
Brady Statistic AS VS Percent: 1 %
Brady Statistic RA Percent Paced: 75 %
Date Time Interrogation Session: 20250903020158
HighPow Impedance: 59 Ohm
Implantable Lead Connection Status: 753985
Implantable Lead Connection Status: 753985
Implantable Lead Connection Status: 753985
Implantable Lead Implant Date: 20240219
Implantable Lead Implant Date: 20250203
Implantable Lead Implant Date: 20250203
Implantable Lead Location: 753858
Implantable Lead Location: 753859
Implantable Lead Location: 753860
Implantable Pulse Generator Implant Date: 20250203
Lead Channel Impedance Value: 380 Ohm
Lead Channel Impedance Value: 390 Ohm
Lead Channel Impedance Value: 690 Ohm
Lead Channel Pacing Threshold Amplitude: 0.75 V
Lead Channel Pacing Threshold Amplitude: 0.875 V
Lead Channel Pacing Threshold Amplitude: 1.5 V
Lead Channel Pacing Threshold Pulse Width: 0.3 ms
Lead Channel Pacing Threshold Pulse Width: 0.5 ms
Lead Channel Pacing Threshold Pulse Width: 0.5 ms
Lead Channel Sensing Intrinsic Amplitude: 12 mV
Lead Channel Sensing Intrinsic Amplitude: 2.5 mV
Lead Channel Setting Pacing Amplitude: 1.875
Lead Channel Setting Pacing Amplitude: 2 V
Lead Channel Setting Pacing Amplitude: 2 V
Lead Channel Setting Pacing Pulse Width: 0.3 ms
Lead Channel Setting Pacing Pulse Width: 0.5 ms
Lead Channel Setting Sensing Sensitivity: 0.5 mV
Pulse Gen Serial Number: 211038341
Zone Setting Status: 755011

## 2023-10-31 ENCOUNTER — Ambulatory Visit: Payer: Self-pay | Admitting: Cardiology

## 2023-11-04 NOTE — Progress Notes (Signed)
Remote ICD Transmission.

## 2023-11-06 ENCOUNTER — Other Ambulatory Visit (HOSPITAL_COMMUNITY): Payer: Self-pay | Admitting: Family Medicine

## 2023-11-06 DIAGNOSIS — R109 Unspecified abdominal pain: Secondary | ICD-10-CM

## 2023-11-09 NOTE — Progress Notes (Unsigned)
 Electrophysiology Office Note:   Date:  11/10/2023  ID:  Catherine Mays, DOB 1965-03-07, MRN [id number]  Primary Cardiologist: Lonni Cash, MD Primary Heart Failure: None Electrophysiologist: OLE ONEIDA HOLTS, MD       History of Present Illness:   Catherine Mays is a 58 y.o. female with h/o  HFrEF s/p CRT-D, PVC's, persistent AF s/p AV nodal ablation, CAD, ICM, MV regurgitation, GERD, hypothyroidism, CKD IIIa, IDA  seen today for cardiac clearance for EGD with Dilatation.    Since last being seen in our clinic the patient reports she has noted new shortness of breath with exertion since the summer. She relays she is having episodes with exertion where she is frightened.  She took the trash to the dump the other day and had an episode where she became so short of breath she sat down in the car and thought this is it, I am going to die at the dump. She has difficulty walking the stairs to get into church (6 steps) due to shortness of breath.  She notes that along with these issues, she has been having difficulty swallowing and is being worked up by GI for EGD and dilatation. No awareness of AF or fast rates.  Denies chest pain with episodes above. Episodes of shortness of breath never occur with rest. She did have a cold two weeks ago but the shortness of breath episodes began in the mid summer before her cold.   She denies chest pain, palpitations, PND, orthopnea, nausea, vomiting, dizziness, syncope, edema, weight gain, or early satiety.  Review of systems complete and found to be negative unless listed in HPI.    EP Information / Studies Reviewed:    EKG is ordered today. Personal review as below.  EKG Interpretation Date/Time:  Tuesday November 10 2023 12:04:29 EDT Ventricular Rate:  73 PR Interval:  172 QRS Duration:  164 QT Interval:  470 QTC Calculation: 517 R Axis:   194  Text Interpretation: AV dual-paced rhythm Confirmed by Aniceto Jarvis ([id number]) on 11/10/2023  1:35:53 PM   ICD Interrogation-  reviewed in detail today,  See PACEART report.  Device History: Abbott BiV ICD implanted 03/30/23 for HFrEF, ICM  History of appropriate therapy: No History of AAD therapy: Tikosyn   RV ICD Lead Malfunction 04/14/22 > lead extracted, new ICD placed   Arrhythmia / AAD / Pertinent EP Studies AF  Sotalol  > previously on, stopped 12/2022  Tikosyn  Loading 1/30-04/02/23  AV Node Ablation 04/01/23  EPS 08/18/23 > PVI ablation, posterior wall ablation. If recurrence of arrhythmia, favor medical therapy alone given degree of scarring noted on electroanatomic voltage mapping within the left atrium     Risk Assessment/Calculations:    CHA2DS2-VASc Score = 3   This indicates a 3.2% annual risk of stroke. The patient's score is based upon: CHF History: 1 HTN History: 0 Diabetes History: 0 Stroke History: 0 Vascular Disease History: 1 Age Score: 0 Gender Score: 1             Physical Exam:   VS:  BP (!) 90/56   Ht 5' 9 (1.753 m)   Wt 132 lb (59.9 kg)   SpO2 96%   BMI 19.49 kg/m    Wt Readings from Last 3 Encounters:  11/10/23 132 lb (59.9 kg)  10/15/23 134 lb 9.6 oz (61.1 kg)  09/15/23 138 lb 12.8 oz (63 kg)     GEN: thin, adult female, appears as though she has lost weight, well developed in no acute  distress NECK: No JVD; No carotid bruits CARDIAC: Regular rate and rhythm, no murmurs, rubs, gallops RESPIRATORY:  Clear to auscultation without rales, wheezing or rhonchi  ABDOMEN: Soft, non-tender, non-distended EXTREMITIES:  No edema; No deformity   ASSESSMENT AND PLAN:    Dyspnea  Weight Loss > from 149 lbs 05/2023 to 132 lbs 10/2023 -down 17 lbs since April 2025  -no evidence of arrhythmia on device to attribute dyspnea  -volume by device suggests euvolemia  -concerned her esophageal stricture may be contributing to compensatory eating (reduced volume) and she has lost weight, muscle mass which in turn has her further deconditioned from a  cardiac standpoint. She is now not confident to perform physical activity without assistance of her husband.  -assess Lexiscan to rule out anginal equivalent given hx  Chronic Systolic Dysfunction s/p Abbott CRT-D  -euvolemic on exam / by device    -100% BiV pacing  -Stable on an appropriate medical regimen -Normal ICD function -See Pace Art report -No changes today  Persistent Atrial Fibrillation s/p AV Node Ablation  Atrial Flutter High Risk Medication Monitoring: Tikosyn   CHA2DS2-VASc 3  -OAC for stroke prophylaxis  -Toprol  25 mg at bedtime  -EKG with AV dual paced 73 bpm, QTc 476 ms corrected for QRS -continue Tikosyn  250 mcg BID   Secondary Hypercoagulable State  -continue Eliquis  5mg  BID, dose reviewed and appropriate by age / wt   Pre-Operative Evaluation  What type of surgery is being performed? EGD/DILATION When is surgery scheduled? TBD What type of clearance is required (medical or pharmacy to hold medication or both? BOTH Are there any medications that need to be held prior to surgery and how long? ELIQUIS  X 2 DAYS PRIOR Name of physician performing surgery?  Dr. Cindie Rouse [facility] at [facility] Phone: [phone], option 5 Fax: [phone] Anesthesia type (none, local, MAC, general)? MAC     [name] perioperative risk of a major cardiac event is 6.6% according to the Revised Cardiac Risk Index (RCRI).  Therefore, she is at moderate to high risk for perioperative complications.   Her functional capacity is good at 5.04 METs according to the Duke Activity Status Index (DASI).  Recommendations: The patient requires a LexiScan stress test to rule out anginal equivalent before EGD.   Antiplatelet and/or Anticoagulation Recommendations:  Eliquis  (Apixaban ) can be held for 2 days prior to surgery.  Please resume post op when felt to be safe.     Disposition:   Follow up with Dr. Cindie / EP APP 4 months for tikosyn  review     Signed, Daphne Barrack, NP-C, AGACNP-BC [facility] - Electrophysiology  11/10/2023, 1:38 PM

## 2023-11-10 ENCOUNTER — Ambulatory Visit: Attending: Pulmonary Disease | Admitting: Pulmonary Disease

## 2023-11-10 ENCOUNTER — Encounter: Payer: Self-pay | Admitting: Pulmonary Disease

## 2023-11-10 ENCOUNTER — Other Ambulatory Visit (HOSPITAL_COMMUNITY): Payer: Self-pay | Admitting: Cardiology

## 2023-11-10 VITALS — BP 90/56 | Ht 69.0 in | Wt 132.0 lb

## 2023-11-10 DIAGNOSIS — I255 Ischemic cardiomyopathy: Secondary | ICD-10-CM | POA: Insufficient documentation

## 2023-11-10 DIAGNOSIS — D6869 Other thrombophilia: Secondary | ICD-10-CM | POA: Diagnosis present

## 2023-11-10 DIAGNOSIS — R0600 Dyspnea, unspecified: Secondary | ICD-10-CM | POA: Diagnosis present

## 2023-11-10 DIAGNOSIS — I4819 Other persistent atrial fibrillation: Secondary | ICD-10-CM | POA: Insufficient documentation

## 2023-11-10 DIAGNOSIS — I5022 Chronic systolic (congestive) heart failure: Secondary | ICD-10-CM | POA: Diagnosis not present

## 2023-11-10 DIAGNOSIS — I480 Paroxysmal atrial fibrillation: Secondary | ICD-10-CM | POA: Diagnosis present

## 2023-11-10 DIAGNOSIS — Z95810 Presence of automatic (implantable) cardiac defibrillator: Secondary | ICD-10-CM | POA: Diagnosis present

## 2023-11-10 DIAGNOSIS — Z79899 Other long term (current) drug therapy: Secondary | ICD-10-CM | POA: Insufficient documentation

## 2023-11-10 LAB — CUP PACEART INCLINIC DEVICE CHECK
Date Time Interrogation Session: 20250916132723
Implantable Lead Connection Status: 753985
Implantable Lead Connection Status: 753985
Implantable Lead Connection Status: 753985
Implantable Lead Implant Date: 20240219
Implantable Lead Implant Date: 20250203
Implantable Lead Implant Date: 20250203
Implantable Lead Location: 753858
Implantable Lead Location: 753859
Implantable Lead Location: 753860
Implantable Pulse Generator Implant Date: 20250203
Pulse Gen Serial Number: 211038341

## 2023-11-10 NOTE — Patient Instructions (Signed)
 Medication Instructions:  Your physician recommends that you continue on your current medications as directed. Please refer to the Current Medication list given to you today.  *If you need a refill on your cardiac medications before your next appointment, please call your pharmacy*  Lab Work: TODAY- BMET, MAGNESIUM  If you have labs (blood work) drawn today and your tests are completely normal, you will receive your results only by: MyChart Message (if you have MyChart) OR A paper copy in the mail If you have any lab test that is abnormal or we need to change your treatment, we will call you to review the results.  Testing/Procedures: Your physician has requested that you have a lexiscan myoview. For further information please visit [email or web address] Please follow instruction sheet, as given.   Follow-Up: At [facility], you and your health needs are our priority.  As part of our continuing mission to provide you with exceptional heart care, our providers are all part of one team.  This team includes your primary Cardiologist (physician) and Advanced Practice Providers or APPs (Physician Assistants and Nurse Practitioners) who all work together to provide you with the care you need, when you need it.  Your next appointment:   4 month(s)  Provider:   Daphne Barrack, NP

## 2023-11-11 LAB — BASIC METABOLIC PANEL WITH GFR
BUN/Creatinine Ratio: 12 (ref 9–23)
BUN: 17 mg/dL (ref 6–24)
CO2: 25 mmol/L (ref 20–29)
Calcium: 8.6 mg/dL — ABNORMAL LOW (ref 8.7–10.2)
Chloride: 97 mmol/L (ref 96–106)
Creatinine, Ser: 1.43 mg/dL — ABNORMAL HIGH (ref 0.57–1.00)
Glucose: 118 mg/dL — ABNORMAL HIGH (ref 70–99)
Potassium: 3.7 mmol/L (ref 3.5–5.2)
Sodium: 141 mmol/L (ref 134–144)
eGFR: 43 mL/min/1.73 — ABNORMAL LOW (ref >59)

## 2023-11-11 LAB — MAGNESIUM: Magnesium: 1.9 mg/dL (ref 1.6–2.3)

## 2023-11-12 ENCOUNTER — Ambulatory Visit: Payer: Self-pay | Admitting: Pulmonary Disease

## 2023-11-12 DIAGNOSIS — E876 Hypokalemia: Secondary | ICD-10-CM

## 2023-11-12 DIAGNOSIS — I5022 Chronic systolic (congestive) heart failure: Secondary | ICD-10-CM

## 2023-11-12 MED ORDER — POTASSIUM CHLORIDE 20 MEQ PO PACK: 20.0000 meq | PACK | ORAL | 11 refills | Status: DC

## 2023-11-17 NOTE — Telephone Encounter (Signed)
 Patient was returning call. Please advise ?

## 2023-11-18 ENCOUNTER — Other Ambulatory Visit (HOSPITAL_COMMUNITY): Payer: Self-pay

## 2023-11-18 NOTE — Telephone Encounter (Signed)
 Pt is returning call to discuss test results. Please advise.

## 2023-11-19 NOTE — Telephone Encounter (Signed)
 Pt requesting a c/b from the nurse

## 2023-11-19 NOTE — Progress Notes (Signed)
 The patient has been notified of the result and verbalized understanding. All questions (if any) were answered.   Ordered and release BMP and Mg+ in 2 weeks

## 2023-11-21 ENCOUNTER — Other Ambulatory Visit: Payer: Self-pay | Admitting: Internal Medicine

## 2023-11-21 DIAGNOSIS — K219 Gastro-esophageal reflux disease without esophagitis: Secondary | ICD-10-CM

## 2023-11-21 DIAGNOSIS — R142 Eructation: Secondary | ICD-10-CM

## 2023-11-21 DIAGNOSIS — R1319 Other dysphagia: Secondary | ICD-10-CM

## 2023-11-21 LAB — LAB REPORT - SCANNED
A1c: 6.7
Albumin, Urine POC: 3
Creatinine, POC: 14.5 mg/dL
EGFR: 51
Microalb Creat Ratio: 21

## 2023-11-24 ENCOUNTER — Encounter (HOSPITAL_COMMUNITY): Payer: Self-pay

## 2023-11-25 NOTE — Telephone Encounter (Signed)
 Added notes to HF visit 10/3

## 2023-11-26 ENCOUNTER — Other Ambulatory Visit (HOSPITAL_COMMUNITY): Payer: Self-pay | Admitting: Family Medicine

## 2023-11-26 DIAGNOSIS — R109 Unspecified abdominal pain: Secondary | ICD-10-CM

## 2023-11-27 ENCOUNTER — Ambulatory Visit (HOSPITAL_BASED_OUTPATIENT_CLINIC_OR_DEPARTMENT_OTHER)
Admission: RE | Admit: 2023-11-27 | Discharge: 2023-11-27 | Disposition: A | Discharge status: Home / Self Care | Source: Ambulatory Visit | Attending: Cardiology | Admitting: Cardiology

## 2023-11-27 ENCOUNTER — Encounter (HOSPITAL_COMMUNITY): Payer: Self-pay | Admitting: Cardiology

## 2023-11-27 ENCOUNTER — Ambulatory Visit (HOSPITAL_COMMUNITY)
Admission: RE | Admit: 2023-11-27 | Discharge: 2023-11-27 | Disposition: A | Discharge status: Home / Self Care | Source: Ambulatory Visit | Attending: Cardiology | Admitting: Cardiology

## 2023-11-27 ENCOUNTER — Other Ambulatory Visit (HOSPITAL_COMMUNITY): Payer: Self-pay | Admitting: Cardiology

## 2023-11-27 VITALS — BP 90/50 | HR 74 | Wt 133.6 lb

## 2023-11-27 DIAGNOSIS — I480 Paroxysmal atrial fibrillation: Secondary | ICD-10-CM | POA: Diagnosis not present

## 2023-11-27 DIAGNOSIS — I081 Rheumatic disorders of both mitral and tricuspid valves: Secondary | ICD-10-CM | POA: Diagnosis not present

## 2023-11-27 DIAGNOSIS — I4892 Unspecified atrial flutter: Secondary | ICD-10-CM | POA: Diagnosis not present

## 2023-11-27 DIAGNOSIS — E785 Hyperlipidemia, unspecified: Secondary | ICD-10-CM | POA: Insufficient documentation

## 2023-11-27 DIAGNOSIS — I2510 Atherosclerotic heart disease of native coronary artery without angina pectoris: Secondary | ICD-10-CM | POA: Insufficient documentation

## 2023-11-27 DIAGNOSIS — E8770 Fluid overload, unspecified: Secondary | ICD-10-CM | POA: Diagnosis not present

## 2023-11-27 DIAGNOSIS — I5022 Chronic systolic (congestive) heart failure: Secondary | ICD-10-CM

## 2023-11-27 DIAGNOSIS — I447 Left bundle-branch block, unspecified: Secondary | ICD-10-CM | POA: Diagnosis not present

## 2023-11-27 DIAGNOSIS — Z79899 Other long term (current) drug therapy: Secondary | ICD-10-CM | POA: Diagnosis not present

## 2023-11-27 DIAGNOSIS — D500 Iron deficiency anemia secondary to blood loss (chronic): Secondary | ICD-10-CM | POA: Diagnosis not present

## 2023-11-27 DIAGNOSIS — Z955 Presence of coronary angioplasty implant and graft: Secondary | ICD-10-CM | POA: Diagnosis not present

## 2023-11-27 DIAGNOSIS — I130 Hypertensive heart and chronic kidney disease with heart failure and stage 1 through stage 4 chronic kidney disease, or unspecified chronic kidney disease: Secondary | ICD-10-CM | POA: Diagnosis not present

## 2023-11-27 DIAGNOSIS — I255 Ischemic cardiomyopathy: Secondary | ICD-10-CM | POA: Diagnosis not present

## 2023-11-27 DIAGNOSIS — I493 Ventricular premature depolarization: Secondary | ICD-10-CM | POA: Diagnosis not present

## 2023-11-27 DIAGNOSIS — Z7901 Long term (current) use of anticoagulants: Secondary | ICD-10-CM | POA: Insufficient documentation

## 2023-11-27 DIAGNOSIS — N183 Chronic kidney disease, stage 3 unspecified: Secondary | ICD-10-CM | POA: Diagnosis not present

## 2023-11-27 DIAGNOSIS — I252 Old myocardial infarction: Secondary | ICD-10-CM | POA: Insufficient documentation

## 2023-11-27 LAB — ECHOCARDIOGRAM COMPLETE
AR max vel: 1.39 cm2
AV Area VTI: 1.36 cm2
AV Area mean vel: 1.36 cm2
AV Mean grad: 2 mmHg
AV Peak grad: 3.3 mmHg
Ao pk vel: 0.91 m/s
Area-P 1/2: 5.79 cm2
S' Lateral: 5.2 cm

## 2023-11-27 LAB — BASIC METABOLIC PANEL WITH GFR
Anion gap: 11 (ref 5–15)
BUN: 29 mg/dL — ABNORMAL HIGH (ref 6–20)
CO2: 28 mmol/L (ref 22–32)
Calcium: 9.1 mg/dL (ref 8.9–10.3)
Chloride: 99 mmol/L (ref 98–111)
Creatinine, Ser: 1.36 mg/dL — ABNORMAL HIGH (ref 0.44–1.00)
GFR, Estimated: 45 mL/min — ABNORMAL LOW (ref >60)
Glucose, Bld: 131 mg/dL — ABNORMAL HIGH (ref 70–99)
Potassium: 4.5 mmol/L (ref 3.5–5.1)
Sodium: 138 mmol/L (ref 135–145)

## 2023-11-27 LAB — CBC
HCT: 42.1 % (ref 36.0–46.0)
Hemoglobin: 13.8 g/dL (ref 12.0–15.0)
MCH: 32.6 pg (ref 26.0–34.0)
MCHC: 32.8 g/dL (ref 30.0–36.0)
MCV: 99.5 fL (ref 80.0–100.0)
Platelets: 164 K/uL (ref 150–400)
RBC: 4.23 MIL/uL (ref 3.87–5.11)
RDW: 15.2 % (ref 11.5–15.5)
WBC: 5.5 K/uL (ref 4.0–10.5)
nRBC: 0 % (ref 0.0–0.2)

## 2023-11-27 LAB — LIPID PANEL
Cholesterol: 119 mg/dL (ref 0–200)
HDL: 47 mg/dL (ref >40)
LDL Cholesterol: 47 mg/dL (ref 0–99)
Total CHOL/HDL Ratio: 2.5 ratio
Triglycerides: 125 mg/dL (ref <150)
VLDL: 25 mg/dL (ref 0–40)

## 2023-11-27 LAB — BRAIN NATRIURETIC PEPTIDE: B Natriuretic Peptide: 1607 pg/mL — ABNORMAL HIGH (ref 0.0–100.0)

## 2023-11-27 MED ORDER — TORSEMIDE 20 MG PO TABS: ORAL_TABLET | ORAL | 3 refills | Status: DC

## 2023-11-27 MED ORDER — DIGOXIN 125 MCG PO TABS: 0.1250 mg | ORAL_TABLET | Freq: Every day | ORAL | 3 refills | Status: AC

## 2023-11-27 MED ORDER — POTASSIUM CHLORIDE 20 MEQ PO PACK: 20.0000 meq | PACK | Freq: Every day | ORAL | 11 refills | Status: DC

## 2023-11-27 NOTE — Patient Instructions (Signed)
 Medication Changes:  START Digoxin  0.125 mg Daily  INCREASE Torsemide  to 40 mg (2 tabs) in AM and 20 mg (1 tab) in PM  INCREASE Potassium to 20 meq (1 packet) Daily  Lab Work:  Labs done today, your results will be available in MyChart, we will contact you for abnormal readings.  Your physician recommends that you return for lab work in: 1 week  Testing/Procedures:  Heart Catheterization on 10/22, see instructions below  Your physician has recommended that you have a cardiopulmonary stress test (CPX). CPX testing is a non-Mays measurement of heart and lung function. It replaces a traditional treadmill stress test. This type of test provides a tremendous amount of information that relates not only to your present condition but also for future outcomes. This test combines measurements of you ventilation, respiratory gas exchange in the lungs, electrocardiogram (EKG), blood pressure and physical response before, during, and following an exercise protocol.  You are scheduled for a Cardiopulmonary Exercise (CPX) Test as [facility] on: Date:  12/09/23  Time:   1:30 PM  Expect to be in the lab for 2 hours. Please plan to arrive 30 minutes prior to your appointment. You may be asked to reschedule your test if you arrive 20 minutes or more after your scheduled appointment time.  Main Campus address: [address] [place], [place] [address] -Entrance C (on [address]): proceed to valet parking or under hospital deck parking using this code _1430__  Check In: [facility] waiting room (1st floor)   General Instructions for the day of the test (Please follow all instructions from your physician): Refrain from ingesting a heavy meal, alcohol, or caffeine or using tobacco products within 2 hours of the test (DO NOT FAST for mare than 8 hours). You may have all other non-alcoholic, non -caffeinated beverage,a light snack (crackers,a piece of fruit, carrot sticks, toast  bagel,etc) up to your appointment. Avoid significant exertion or exercise within 24 hours of your test. Be prepared to exercise and sweat. Your clothing should permit freedom of movement and include walking or running shoes. Women bring loose fitting short sleeved blouse.  This evaluation may be fatiguing and you may wish ti have someone accompany you to the assessment to drive you home afterward. Bring a list of your medications with you, including dosage and frequency you take the medications (  I.e.,once per day, twice per day, etc). Take all medications as prescribed, unless noted below or instructed to do so by your physician.  Please do not take the following medications prior to your CPX:  _________________________________________________  _________________________________________________  Brief description of the test: A brief lung test will be performed. This will involve you taking deep breaths and blowing hard and fast through your mouth. During these , a clip will be on your nose and you will be breathing through a breathing device.   For the exercise portion of the test you will be walking on a treadmill, or riding a stationary bike, to your maximal effor or until symptoms such as chest pain, shortness of breath, leg pain or dizziness limit your exercise. You will be breathing in and out of a breathing device through your mouth (a clip will be on your nose again). Your heart rate, ECG, blood pressure, oxygen saturations, breathing rate and depth, amount of oxygen you consume and amount of carbon dioxide you produce will be measured and monitored throughout the exercise test.  If you need to cancel or reschedule  your appointment please call [phone] If you have further questions please call your physician or Catherine Mays at [phone]  Special Instructions // Education:  Do the following things EVERYDAY: Weigh yourself in the morning before breakfast. Write it down and keep it  in a log. Take your medicines as prescribed Eat low salt foods--Limit salt (sodium) to 2000 mg per day.  Stay as active as you can everyday Limit all fluids for the day to less than 2 liters    HEART CATHETERIZATION  You are scheduled for a Cardiac Catheterization on Wednesday, October 22 with Dr. Ezra Mays.  1. Please arrive at the [facility] (Main Entrance A) at [facility]: [address] [place], [place] [address] at 9:30 AM (This time is 2 hour(s) before your procedure to ensure your preparation).   Free valet parking service is available. You will check in at ADMITTING. The support person will be asked to wait in the waiting room.  It is OK to have someone drop you off and come back when you are ready to be discharged.    Special note: Every effort is made to have your procedure done on time. Please understand that emergencies sometimes delay scheduled procedures.  2. Diet: Nothing to eat after midnight.   3. Hydration: On October 22, you may drink approved liquids (see below) until 2 hours before the procedure with 8 oz of water  as your last intake.   List of approved liquids water , clear juice, clear tea, black coffee, fruit juices, non-citric and without pulp, carbonated beverages, Gatorade, Kool -Aid, plain Jello-O and plain ice popsicles.  4. Labs: DONE TODAY  5. Medication instructions in preparation for your procedure:    DO NOT TAKE ELIQUIS  Tuesday 10/21 or Wednesday 10/22 AM  Tuesday 10/22 AM DO NOT TAKE: Torsemide , Spironolactone , or Eliquis     On the morning of your procedure, take any morning medicines NOT listed above.  You may use sips of water .  6. Plan to go home the same day, you will only stay overnight if medically necessary. 7. Bring a current list of your medications and current insurance cards. 8. You MUST have a responsible person to drive you home. 9. Someone MUST be with you the first 24 hours after you arrive home or your discharge  will be delayed. 10. Please wear clothes that are easy to get on and off and wear slip-on shoes.  Thank you for allowing us  to care for you!   -- Catherine Mays Cardiovascular services   Follow-Up in: 1 month   At [facility], you and your health needs are our priority. We have a designated team specialized in the treatment of Heart Failure. This Care Team includes your primary Heart Failure Specialized Cardiologist (physician), Advanced Practice Providers (APPs- Physician Assistants and Nurse Practitioners), and Pharmacist who all work together to provide you with the care you need, when you need it.   You may see any of the following providers on your designated Care Team at your next follow up:  Dr. Toribio Fuel Dr. Ezra Mays Dr. Ria Commander Dr. Odis Brownie Greig Mosses, NP Caffie Shed, [place] [facility] [place], [place] Beckey Coe, NP [place] Lee, NP Tinnie Redman, PharmD   Please be sure to bring in all your medications bottles to every appointment.   Need to Contact Us :  If you have any questions or concerns before your next appointment please send us  a message through mychart or call  our office at [phone].    TO LEAVE A MESSAGE FOR THE NURSE SELECT OPTION 2, PLEASE LEAVE A MESSAGE INCLUDING: YOUR NAME DATE OF BIRTH CALL BACK NUMBER REASON FOR CALL**this is important as we prioritize the call backs  YOU WILL RECEIVE A CALL BACK THE SAME DAY AS LONG AS YOU CALL BEFORE 4:00 PM

## 2023-11-28 LAB — BASIC METABOLIC PANEL WITH GFR
BUN/Creatinine Ratio: 21 (ref 9–23)
BUN: 29 mg/dL — ABNORMAL HIGH (ref 6–24)
CO2: 24 mmol/L (ref 20–29)
Calcium: 9.2 mg/dL (ref 8.7–10.2)
Chloride: 98 mmol/L (ref 96–106)
Creatinine, Ser: 1.38 mg/dL — ABNORMAL HIGH (ref 0.57–1.00)
Glucose: 126 mg/dL — ABNORMAL HIGH (ref 70–99)
Potassium: 4.2 mmol/L (ref 3.5–5.2)
Sodium: 139 mmol/L (ref 134–144)
eGFR: 45 mL/min/1.73 — ABNORMAL LOW (ref >59)

## 2023-11-28 LAB — MAGNESIUM: Magnesium: 1.9 mg/dL (ref 1.6–2.3)

## 2023-11-29 ENCOUNTER — Ambulatory Visit (HOSPITAL_COMMUNITY): Payer: Self-pay | Admitting: Cardiology

## 2023-11-29 NOTE — Progress Notes (Signed)
 Advanced Heart Failure Clinic Note  PCP: Hyacinth Honey, NP HF Cardiology: Dr. Rolan  Chief complaint: CHF  [name] is a 58 y.o. with history of CAD s/p anterior STEMI with DES to LAD, ischemic cardiomyopathy, mitral regurgitation, and paroxysmal atrial fibrillation.  She was admitted in 7/17 with anterior STEMI.  She had a late presentation to the cath lab.  Echo in 10/17 showed EF about 25% with LAD-territory wall motion abnormalities.  She had peri-MI atrial fibrillation and was started on Xarelto .  She was put on amiodarone .   After discharge, she continued to feel poorly.  She developed nausea, anorexia, and significant fatigue.  She was admitted for evaluation in 10/17 given concern for low output heart failure.  However, stopping amiodarone  essentially resolved her symptoms.  She had RHC showing preserved cardiac output but the PCWP was high due to prominent v-waves, presumably from significant mitral regurgitation.  Of note, she was in atrial fibrillation for a time during this admission.   She had St Jude ICD placed.  She is back at work for the [employer].   TEE in 8/18 to evaluate the mitral valve showed EF 30%, moderate MR (probably functional).    Patient was seen by Dr. Inocencio and noted to have very frequent PVCs on device interrogation.  Holter in 12/18 showed 41% PVCs.  She was started on sotalol  80 mg bid and titrated up to 120 mg bid.  Coreg  was stopped with the addition of sotalol  and losartan  was decreased to 12.5 mg daily due to low BP.  She had PVC ablation in 2/19 but PVCs recurred. Sotalol  was increased to 160 mg bid.   Echo 8/19 showed EF 30% with regional wall motion abnormalities, normal RV size and systolic function, moderate MR.   She was admitted in 10/19 with chest pain.  Cath showed patent LAD stent and no obstructive CAD.  She was found to be anemic with Fe deficiency (hgb down to 8).  FOBT+. She has had IV Fe and blood transfusion.  EGD  and colonoscopy this year were fairly unremarkable.  She had diverticulosis.  Capsule endoscopy negative in 9/21.   Echo in 1/22 showed EF 25-30%, wall motion abnormalities in LAD distribution, mild-moderate MR, normal RV.    Admitted 10/22 with anemia, hemoglobin of 8.6 (baseline around 10-11). She was transfused 1 unit of PRBC on 11/29/2020. GI was consulted and she underwent EGD on 11/30/2020 which showed no source of bleeding.  Capsule endoscopy on 12/01/2020 showed blood in the mid/distal small bowel and proximal colon.  Colonoscopy on 12/02/2020 showed blood in the terminal ileum but no clear source.  It is suspected that the source of bleeding is in the distal small bowel that is not able to be reached via routine colonoscope.  GI asked IR to consider an arteriogram to localize and treat the source of her bleeding. CT done and felt best option was DBE with Dr. Charlean at [place]. Double balloon endoscopy was done in 12/22, no definite cause of GI bleeding was found.   Echo in 3/23 showed EF 25-30%, apical akinesis, mild MR, normal RV, normal IVC. Echo was done today and reviewed, EF 25% with LAD territory akinesis, mild LV dilation, mild MR, RV normal, IVC normal.   Admitted 11/24 with hypotension and rate controlled AF. + orthostatics. GDMT & sotalol  held. Spontaneously converted to NSR. Planned to hold on sotalol  and start on Tikosyn  inpatient the following week. Planned admission 12/24 for Tikosyn   loading. Had waxing/waning PVC burden, which improved with addition of low dose Toprol . She was discharged home, remained off Entresto  with low BP.  Seen in ED 02/11/23 for urgent DCCV, successful restoration of NSR.  Admitted 1/25 with symptomatic Afib with hypotension. Planned TEE/DCCV however she converted to NSR after intubation/coughing.  TEE showed EF 20-25%, normal RV, moderate MR and TR, ? Mobile density on RA lead ? thrombus. Bc negative. Underwent AV node ablation and upgrade to CRT-D. Per EP, plan to  let leads mature and arrange for AF ablation in 6 months.   Seen in ED 05/31/23 with weakness & fatigue. BNP 995 and given IV lasix  and discharged home.   Saw EP 06/10/23 and in AFL. Did not pace out as she could transition to AF. Arranged for DCCV.  In 6/25, she had atrial fibrillation/flutter ablation.  TEE in 6/25 showed EF 20-25%, moderate LV dilation, normal RV, severe MR, severe TR, IVC normal.   Echo was done today and reviewed, EF 20-25%, severe LV dilation, RV normal, PASP 59 mmHg, severe biatrial enlargement, moderate MR, moderate TR, IVC dilated.   She returns for followup of CHF. She has been short of breath with exertion since this summer.  She gets dyspneic walking 25-30 yards.  She is short of breath with stairs and inclines.  OK with ADLs.  No orthopnea/PND.  No chest pain. Lightheaded if she stands too fast, SBP runs around 90. Poor appetite with weight loss.   ECG (personally reviewed): NSR, BiV pacing, paced QTc 530 msec  St Jude device interrogation (personally reviewed):  Stable thoracic impedance by [place], BiV pacing > 99%. 79% A-paced, 0% AF.   Labs (6/24): K 4, creatinine 1.21, BNP 304 Labs (8/24): LDL 55 Labs (12/24): K 4.5, creatinine 1.01 Labs (2/25): K 4.3, creatinine 1.02 Labs (4/25): K 4.2, creatinine 1.27 Labs (9/25): K 3.7, creatinine 1.43  PMH:  1. CAD: Anterior STEMI in 7/17 with late presentation to the cath lab. She had DES to proximal LAD.  No significant disease in other vessels.  - LHC (10/19): Patent LAD stent, no obstructive disease.  2. Chronic systolic CHF: Ischemic cardiomyopathy. St Jude CRT-D device.  - Echo 10/17 with EF 25%, akinesis of the mid-apical anteroseptal and anterior walls, apical dyskinesis, moderate to severe MR with incomplete coaptation.  - RHC (10/17): mean RA 4, PA 59/17 mean 38, mean PCWP 27 with prominent V waves suggesting significant MR, CI 3.06, PVR 2 WU.  - ACEI cough.  - Echo (11/17): EF 30-35%, normal RV size and  systolic function, severe MR with restriction of posterior leaflet.   - Echo (8/19): EF 30% with regional wall motion abnormalities, normal RV size and systolic function, moderate MR.  - Echo (12/20): EF 30%, LAD wall motion abnormalities, normal RV, PASP 35, moderate functional MR.  - Echo (1/22): EF 25-30%, wall motion abnormalities in LAD distribution, mild-moderate MR, normal RV.  - Echo (3/23): EF 25-30%, apical akinesis, mild MR, normal RV, normal IVC.  - Echo (9/24): EF 25% with LAD territory akinesis, mild LV dilation, mild MR, RV normal, IVC normal.  - TEE 1/25: EF 20-25%, Rv normal, mobile density on RA lead/? Thrombus, moderate MR, moderate TR - Echo (10/25): EF 20-25%, severe LV dilation, RV normal, PASP 59 mmHg, severe biatrial enlargement, moderate MR, moderate TR, IVC dilated.  3. Atrial fibrillation: Paroxysmal.  Noted 7/17 admission peri-MI, also noted again 10/17 admission. She did not tolerate amiodarone  due to nausea/anorexia.  - Started on  Tikosyn  (12/24) - DCCV 12/24 to NSR - s/p AVN ablation 2/25 - AF ablation in 6/25.  4. Mitral regurgitation: Moderate to severe on 10/17 echo, incomplete coaptation.  ?Etiology, her MI (anterior) does not typically lead to ischemic MR.  - Echo in 11/17 with severe MR, posterior leaflet restricted.  - TEE (8/18): EF 30%, moderately dilated LV with akinetic septal and anterior walls, moderate functional MR, normal RV size and systolic function.  - Echo (8/19): Moderate MR - Echo (3/23): Mild MR.  - Echo (9/24): Mild MR. - TEE (1/25): moderate MR 5. Hyperlipidemia: Myalgias with atorvastatin .  6. PVCs: Holter 12/18 with 41% PVCs.  - PVC ablation in 2/19 with recurrence of PVCs.  7. GI bleeding:  - EGD (4/21): unremarkable.  - Colonoscopy (7/21): Diverticulosis.  - Capsule endoscopy (9/21): negative.  - Double balloon endoscopy at [facility] in 12/22 did not show cause for bleeding.  8. CKD stage 3  SH: Married, lives in [place], works for  the [employer], no smoking or ETOH.   Family History  Problem Relation Age of Onset   Arrhythmia Mother    Lung cancer Mother        bronchiectasis   Heart failure Father    Heart attack Father    Heart attack Brother    Arrhythmia Brother    Colon cancer Neg Hx    Colon polyps Neg Hx    ROS: All systems reviewed and negative except as per HPI.   Current Outpatient Medications on File Prior to Encounter  Medication Sig Dispense Refill   acetaminophen  (TYLENOL ) 500 MG tablet Take 1,000 mg by mouth every 6 (six) hours as needed for mild pain.     apixaban  (ELIQUIS ) 5 MG TABS tablet Take 1 tablet (5 mg total) by mouth 2 (two) times daily. Resume Sunday 2/9     buPROPion  (WELLBUTRIN  XL) 150 MG 24 hr tablet Take 150 mg by mouth daily.     Calcium  Carb-Cholecalciferol (CALCIUM  + D3 PO) Take 1,200 mg by mouth every morning. Slow release     Coenzyme Q10 (COQ-10) 200 MG CAPS Take 200 mg by mouth daily.     dofetilide  (TIKOSYN ) 250 MCG capsule TAKE 1 CAPSULE BY MOUTH TWICE DAILY 180 capsule 3   esomeprazole  (NEXIUM ) 40 MG capsule TAKE 1 CAPSULE(40 MG) BY MOUTH TWICE DAILY BEFORE A MEAL 60 capsule 3   ferrous sulfate  325 (65 FE) MG EC tablet Take 325 mg by mouth daily.     fexofenadine (ALLEGRA) 180 MG tablet Take 180 mg by mouth daily.     levothyroxine  (SYNTHROID ) 112 MCG tablet Take 112 mcg by mouth daily before breakfast.     metoprolol  succinate (TOPROL -XL) 25 MG 24 hr tablet TAKE 1 TABLET BY MOUTH EVERY NIGHT AT BEDTIME 30 tablet 5   mometasone (NASONEX) 50 MCG/ACT nasal spray Place 2 sprays into the nose daily.     nitroGLYCERIN  (NITROSTAT ) 0.4 MG SL tablet DISSOLVE 1 TABLET UNDER THE TONGUE EVERY 5 MINUTES AS NEEDED FOR CHEST PAIN 25 tablet 0   sertraline  (ZOLOFT ) 50 MG tablet Take 50 mg by mouth daily.     spironolactone  (ALDACTONE ) 25 MG tablet TAKE 1 TABLET(25 MG) BY MOUTH DAILY 30 tablet 11   traZODone  (DESYREL ) 50 MG tablet Take 25 mg by mouth at bedtime.     No current  facility-administered medications on file prior to encounter.   Wt Readings from Last 3 Encounters:  11/27/23 60.6 kg (133 lb 9.6 oz)  11/10/23 59.9  kg (132 lb)  10/15/23 61.1 kg (134 lb 9.6 oz)   BP (!) 90/50   Pulse 74   Wt 60.6 kg (133 lb 9.6 oz)   SpO2 98%   BMI 19.73 kg/m   PHYSICAL EXAM: General: NAD Neck: JVP 8-9 cm, no thyromegaly or thyroid  nodule.  Lungs: Clear to auscultation bilaterally with normal respiratory effort. CV: Nondisplaced PMI.  Heart regular S1/S2, no S3/S4, no murmur.  No peripheral edema.  No carotid bruit.  Normal pedal pulses.  Abdomen: Soft, nontender, no hepatosplenomegaly, no distention.  Skin: Intact without lesions or rashes.  Neurologic: Alert and oriented x 3.  Psych: Normal affect. Extremities: No clubbing or cyanosis.  HEENT: Normal.   Assessment/Plan: 1. Atrial fibrillation/AFL: Unable to tolerate amiodarone  due to GI side effects. She failed sotalol . Now on Tikosyn . Had AV nodal ablation with CRT.  Now s/p AF ablation in 6/25. She is in NSR today, no AF on device interrogation.  - Continue Tikosyn  per EP. Paced QTc ok.  - Continue Toprol -XL 25 mg at bedtime - Continue Eliquis  5 mg bid.  2. CAD: S/p anterior MI in 7/17 with DES to RCA.  Cath in 10/19 with no obstructive CAD.  No chest pain.  - No ASA given need for Eliquis  and h/o GIB  - Continue Crestor . Check lipids today.  3. Chronic systolic CHF: Ischemic cardiomyopathy.  EF 25% on echo in 10/17, 30-35% on echo in 11/17, 30% on TEE in 8/18. She has extensive LAD-territory scar. 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  Echo in 3/23 showed that EF remained 25-30% with LAD-territory scar. Echo 9/24 showed stable EF 25% with LAD territory akinesis, mild LV dilation, mild MR, RV normal, IVC normal. TEE (1/25) with EF 20-25%, normal RV, moderate MR and TR. With wide LBBB, now s/p CRT-D upgrade. Echo today showed EF 20-25%, severe LV dilation, RV normal, PASP 59 mmHg, severe biatrial enlargement, moderate MR,  moderate TR, IVC dilated. BP chronically low, has limited GDMT.  NYHA class III, mildly volume overloaded on exam.  Symptomatically has been doing worse over the last few month.  - Increase torsemide  to 40 qam/20 qpm. BMET/BNP today and BMET in 10 days.  - Add digoxin  0.125 daily.  - Continue spironolactone  25 mg daily.  - Continue Toprol  XL 25 mg at bedtime. - No BP room to start ARB/ARNi. - She did not tolerate dapagliflozin  due to yeast infections.  - I worry that she is nearing the need for advanced therapies.  She would be a transplant candidate.  I am going to arrange for LHC/RHC to assess filling pressures and cardiac output and to reassess CAD with worsening exertional symptoms.  We discussed risks/benefits and she agrees to procedure.  - I will arrange for CPX.  4. Mitral regurgitation: Moderate to severe on 10/17 echo, severe on 11/17 echo.  Underwent TEE in 8/18.  This showed moderate MR that appeared to be functional.  No indication for surgery or Mitraclip. Echo in 3/23 showed mild MR. Echo 9/24 with mild MR. TEE this admission (1/25) with moderate MR. Echo today with moderate MR.  5. PVCs: Very frequent on 12/18 holter (41% beats), she has had a PVC ablation in 2/19. Due to recurrence of PVCs post-ablation, she was placed on sotalol . Unfortunately, had break through AF, with PVCs. Now off sotalol  (and started on Tikosyn  for AF). Asymptomatic.  - Continue Toprol . 6. Hyperlipidemia: Tolerating Crestor . Goal LDL < 55.   - Check lipids today.  7.  GI bleeding: With anemia.  She had episode in 10/22, has not had overt recurrence. She has been considered for Watchman.   - She wants to hold off on Watchman for now, will to consider with a close recurrence of GI bleeding.  8. Thrombus vs vegetation on ICD lead: w/u showed negative blood cx.  9. CKD stage 3: BMET today.   Followup in 4 wks.   I spent 41 minutes reviewing records, interviewing/examining patient, and managing orders.     Ezra Shuck  11/29/2023

## 2023-11-29 NOTE — H&P (View-Only) (Signed)
 Advanced Heart Failure Clinic Note  PCP: Hyacinth Honey, NP HF Cardiology: Dr. Rolan  Chief complaint: CHF  [name] is a 58 y.o. with history of CAD s/p anterior STEMI with DES to LAD, ischemic cardiomyopathy, mitral regurgitation, and paroxysmal atrial fibrillation.  She was admitted in 7/17 with anterior STEMI.  She had a late presentation to the cath lab.  Echo in 10/17 showed EF about 25% with LAD-territory wall motion abnormalities.  She had peri-MI atrial fibrillation and was started on Xarelto .  She was put on amiodarone .   After discharge, she continued to feel poorly.  She developed nausea, anorexia, and significant fatigue.  She was admitted for evaluation in 10/17 given concern for low output heart failure.  However, stopping amiodarone  essentially resolved her symptoms.  She had RHC showing preserved cardiac output but the PCWP was high due to prominent v-waves, presumably from significant mitral regurgitation.  Of note, she was in atrial fibrillation for a time during this admission.   She had St Jude ICD placed.  She is back at work for the [employer].   TEE in 8/18 to evaluate the mitral valve showed EF 30%, moderate MR (probably functional).    Patient was seen by Dr. Inocencio and noted to have very frequent PVCs on device interrogation.  Holter in 12/18 showed 41% PVCs.  She was started on sotalol  80 mg bid and titrated up to 120 mg bid.  Coreg  was stopped with the addition of sotalol  and losartan  was decreased to 12.5 mg daily due to low BP.  She had PVC ablation in 2/19 but PVCs recurred. Sotalol  was increased to 160 mg bid.   Echo 8/19 showed EF 30% with regional wall motion abnormalities, normal RV size and systolic function, moderate MR.   She was admitted in 10/19 with chest pain.  Cath showed patent LAD stent and no obstructive CAD.  She was found to be anemic with Fe deficiency (hgb down to 8).  FOBT+. She has had IV Fe and blood transfusion.  EGD  and colonoscopy this year were fairly unremarkable.  She had diverticulosis.  Capsule endoscopy negative in 9/21.   Echo in 1/22 showed EF 25-30%, wall motion abnormalities in LAD distribution, mild-moderate MR, normal RV.    Admitted 10/22 with anemia, hemoglobin of 8.6 (baseline around 10-11). She was transfused 1 unit of PRBC on 11/29/2020. GI was consulted and she underwent EGD on 11/30/2020 which showed no source of bleeding.  Capsule endoscopy on 12/01/2020 showed blood in the mid/distal small bowel and proximal colon.  Colonoscopy on 12/02/2020 showed blood in the terminal ileum but no clear source.  It is suspected that the source of bleeding is in the distal small bowel that is not able to be reached via routine colonoscope.  GI asked IR to consider an arteriogram to localize and treat the source of her bleeding. CT done and felt best option was DBE with Dr. Charlean at [place]. Double balloon endoscopy was done in 12/22, no definite cause of GI bleeding was found.   Echo in 3/23 showed EF 25-30%, apical akinesis, mild MR, normal RV, normal IVC. Echo was done today and reviewed, EF 25% with LAD territory akinesis, mild LV dilation, mild MR, RV normal, IVC normal.   Admitted 11/24 with hypotension and rate controlled AF. + orthostatics. GDMT & sotalol  held. Spontaneously converted to NSR. Planned to hold on sotalol  and start on Tikosyn  inpatient the following week. Planned admission 12/24 for Tikosyn   loading. Had waxing/waning PVC burden, which improved with addition of low dose Toprol . She was discharged home, remained off Entresto  with low BP.  Seen in ED 02/11/23 for urgent DCCV, successful restoration of NSR.  Admitted 1/25 with symptomatic Afib with hypotension. Planned TEE/DCCV however she converted to NSR after intubation/coughing.  TEE showed EF 20-25%, normal RV, moderate MR and TR, ? Mobile density on RA lead ? thrombus. Bc negative. Underwent AV node ablation and upgrade to CRT-D. Per EP, plan to  let leads mature and arrange for AF ablation in 6 months.   Seen in ED 05/31/23 with weakness & fatigue. BNP 995 and given IV lasix  and discharged home.   Saw EP 06/10/23 and in AFL. Did not pace out as she could transition to AF. Arranged for DCCV.  In 6/25, she had atrial fibrillation/flutter ablation.  TEE in 6/25 showed EF 20-25%, moderate LV dilation, normal RV, severe MR, severe TR, IVC normal.   Echo was done today and reviewed, EF 20-25%, severe LV dilation, RV normal, PASP 59 mmHg, severe biatrial enlargement, moderate MR, moderate TR, IVC dilated.   She returns for followup of CHF. She has been short of breath with exertion since this summer.  She gets dyspneic walking 25-30 yards.  She is short of breath with stairs and inclines.  OK with ADLs.  No orthopnea/PND.  No chest pain. Lightheaded if she stands too fast, SBP runs around 90. Poor appetite with weight loss.   ECG (personally reviewed): NSR, BiV pacing, paced QTc 530 msec  St Jude device interrogation (personally reviewed):  Stable thoracic impedance by [place], BiV pacing > 99%. 79% A-paced, 0% AF.   Labs (6/24): K 4, creatinine 1.21, BNP 304 Labs (8/24): LDL 55 Labs (12/24): K 4.5, creatinine 1.01 Labs (2/25): K 4.3, creatinine 1.02 Labs (4/25): K 4.2, creatinine 1.27 Labs (9/25): K 3.7, creatinine 1.43  PMH:  1. CAD: Anterior STEMI in 7/17 with late presentation to the cath lab. She had DES to proximal LAD.  No significant disease in other vessels.  - LHC (10/19): Patent LAD stent, no obstructive disease.  2. Chronic systolic CHF: Ischemic cardiomyopathy. St Jude CRT-D device.  - Echo 10/17 with EF 25%, akinesis of the mid-apical anteroseptal and anterior walls, apical dyskinesis, moderate to severe MR with incomplete coaptation.  - RHC (10/17): mean RA 4, PA 59/17 mean 38, mean PCWP 27 with prominent V waves suggesting significant MR, CI 3.06, PVR 2 WU.  - ACEI cough.  - Echo (11/17): EF 30-35%, normal RV size and  systolic function, severe MR with restriction of posterior leaflet.   - Echo (8/19): EF 30% with regional wall motion abnormalities, normal RV size and systolic function, moderate MR.  - Echo (12/20): EF 30%, LAD wall motion abnormalities, normal RV, PASP 35, moderate functional MR.  - Echo (1/22): EF 25-30%, wall motion abnormalities in LAD distribution, mild-moderate MR, normal RV.  - Echo (3/23): EF 25-30%, apical akinesis, mild MR, normal RV, normal IVC.  - Echo (9/24): EF 25% with LAD territory akinesis, mild LV dilation, mild MR, RV normal, IVC normal.  - TEE 1/25: EF 20-25%, Rv normal, mobile density on RA lead/? Thrombus, moderate MR, moderate TR - Echo (10/25): EF 20-25%, severe LV dilation, RV normal, PASP 59 mmHg, severe biatrial enlargement, moderate MR, moderate TR, IVC dilated.  3. Atrial fibrillation: Paroxysmal.  Noted 7/17 admission peri-MI, also noted again 10/17 admission. She did not tolerate amiodarone  due to nausea/anorexia.  - Started on  Tikosyn  (12/24) - DCCV 12/24 to NSR - s/p AVN ablation 2/25 - AF ablation in 6/25.  4. Mitral regurgitation: Moderate to severe on 10/17 echo, incomplete coaptation.  ?Etiology, her MI (anterior) does not typically lead to ischemic MR.  - Echo in 11/17 with severe MR, posterior leaflet restricted.  - TEE (8/18): EF 30%, moderately dilated LV with akinetic septal and anterior walls, moderate functional MR, normal RV size and systolic function.  - Echo (8/19): Moderate MR - Echo (3/23): Mild MR.  - Echo (9/24): Mild MR. - TEE (1/25): moderate MR 5. Hyperlipidemia: Myalgias with atorvastatin .  6. PVCs: Holter 12/18 with 41% PVCs.  - PVC ablation in 2/19 with recurrence of PVCs.  7. GI bleeding:  - EGD (4/21): unremarkable.  - Colonoscopy (7/21): Diverticulosis.  - Capsule endoscopy (9/21): negative.  - Double balloon endoscopy at [facility] in 12/22 did not show cause for bleeding.  8. CKD stage 3  SH: Married, lives in [place], works for  the [employer], no smoking or ETOH.   Family History  Problem Relation Age of Onset   Arrhythmia Mother    Lung cancer Mother        bronchiectasis   Heart failure Father    Heart attack Father    Heart attack Brother    Arrhythmia Brother    Colon cancer Neg Hx    Colon polyps Neg Hx    ROS: All systems reviewed and negative except as per HPI.   Current Outpatient Medications on File Prior to Encounter  Medication Sig Dispense Refill   acetaminophen  (TYLENOL ) 500 MG tablet Take 1,000 mg by mouth every 6 (six) hours as needed for mild pain.     apixaban  (ELIQUIS ) 5 MG TABS tablet Take 1 tablet (5 mg total) by mouth 2 (two) times daily. Resume Sunday 2/9     buPROPion  (WELLBUTRIN  XL) 150 MG 24 hr tablet Take 150 mg by mouth daily.     Calcium  Carb-Cholecalciferol (CALCIUM  + D3 PO) Take 1,200 mg by mouth every morning. Slow release     Coenzyme Q10 (COQ-10) 200 MG CAPS Take 200 mg by mouth daily.     dofetilide  (TIKOSYN ) 250 MCG capsule TAKE 1 CAPSULE BY MOUTH TWICE DAILY 180 capsule 3   esomeprazole  (NEXIUM ) 40 MG capsule TAKE 1 CAPSULE(40 MG) BY MOUTH TWICE DAILY BEFORE A MEAL 60 capsule 3   ferrous sulfate  325 (65 FE) MG EC tablet Take 325 mg by mouth daily.     fexofenadine (ALLEGRA) 180 MG tablet Take 180 mg by mouth daily.     levothyroxine  (SYNTHROID ) 112 MCG tablet Take 112 mcg by mouth daily before breakfast.     metoprolol  succinate (TOPROL -XL) 25 MG 24 hr tablet TAKE 1 TABLET BY MOUTH EVERY NIGHT AT BEDTIME 30 tablet 5   mometasone (NASONEX) 50 MCG/ACT nasal spray Place 2 sprays into the nose daily.     nitroGLYCERIN  (NITROSTAT ) 0.4 MG SL tablet DISSOLVE 1 TABLET UNDER THE TONGUE EVERY 5 MINUTES AS NEEDED FOR CHEST PAIN 25 tablet 0   sertraline  (ZOLOFT ) 50 MG tablet Take 50 mg by mouth daily.     spironolactone  (ALDACTONE ) 25 MG tablet TAKE 1 TABLET(25 MG) BY MOUTH DAILY 30 tablet 11   traZODone  (DESYREL ) 50 MG tablet Take 25 mg by mouth at bedtime.     No current  facility-administered medications on file prior to encounter.   Wt Readings from Last 3 Encounters:  11/27/23 60.6 kg (133 lb 9.6 oz)  11/10/23 59.9  kg (132 lb)  10/15/23 61.1 kg (134 lb 9.6 oz)   BP (!) 90/50   Pulse 74   Wt 60.6 kg (133 lb 9.6 oz)   SpO2 98%   BMI 19.73 kg/m   PHYSICAL EXAM: General: NAD Neck: JVP 8-9 cm, no thyromegaly or thyroid  nodule.  Lungs: Clear to auscultation bilaterally with normal respiratory effort. CV: Nondisplaced PMI.  Heart regular S1/S2, no S3/S4, no murmur.  No peripheral edema.  No carotid bruit.  Normal pedal pulses.  Abdomen: Soft, nontender, no hepatosplenomegaly, no distention.  Skin: Intact without lesions or rashes.  Neurologic: Alert and oriented x 3.  Psych: Normal affect. Extremities: No clubbing or cyanosis.  HEENT: Normal.   Assessment/Plan: 1. Atrial fibrillation/AFL: Unable to tolerate amiodarone  due to GI side effects. She failed sotalol . Now on Tikosyn . Had AV nodal ablation with CRT.  Now s/p AF ablation in 6/25. She is in NSR today, no AF on device interrogation.  - Continue Tikosyn  per EP. Paced QTc ok.  - Continue Toprol -XL 25 mg at bedtime - Continue Eliquis  5 mg bid.  2. CAD: S/p anterior MI in 7/17 with DES to RCA.  Cath in 10/19 with no obstructive CAD.  No chest pain.  - No ASA given need for Eliquis  and h/o GIB  - Continue Crestor . Check lipids today.  3. Chronic systolic CHF: Ischemic cardiomyopathy.  EF 25% on echo in 10/17, 30-35% on echo in 11/17, 30% on TEE in 8/18. She has extensive LAD-territory scar. 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  Echo in 3/23 showed that EF remained 25-30% with LAD-territory scar. Echo 9/24 showed stable EF 25% with LAD territory akinesis, mild LV dilation, mild MR, RV normal, IVC normal. TEE (1/25) with EF 20-25%, normal RV, moderate MR and TR. With wide LBBB, now s/p CRT-D upgrade. Echo today showed EF 20-25%, severe LV dilation, RV normal, PASP 59 mmHg, severe biatrial enlargement, moderate MR,  moderate TR, IVC dilated. BP chronically low, has limited GDMT.  NYHA class III, mildly volume overloaded on exam.  Symptomatically has been doing worse over the last few month.  - Increase torsemide  to 40 qam/20 qpm. BMET/BNP today and BMET in 10 days.  - Add digoxin  0.125 daily.  - Continue spironolactone  25 mg daily.  - Continue Toprol  XL 25 mg at bedtime. - No BP room to start ARB/ARNi. - She did not tolerate dapagliflozin  due to yeast infections.  - I worry that she is nearing the need for advanced therapies.  She would be a transplant candidate.  I am going to arrange for LHC/RHC to assess filling pressures and cardiac output and to reassess CAD with worsening exertional symptoms.  We discussed risks/benefits and she agrees to procedure.  - I will arrange for CPX.  4. Mitral regurgitation: Moderate to severe on 10/17 echo, severe on 11/17 echo.  Underwent TEE in 8/18.  This showed moderate MR that appeared to be functional.  No indication for surgery or Mitraclip. Echo in 3/23 showed mild MR. Echo 9/24 with mild MR. TEE this admission (1/25) with moderate MR. Echo today with moderate MR.  5. PVCs: Very frequent on 12/18 holter (41% beats), she has had a PVC ablation in 2/19. Due to recurrence of PVCs post-ablation, she was placed on sotalol . Unfortunately, had break through AF, with PVCs. Now off sotalol  (and started on Tikosyn  for AF). Asymptomatic.  - Continue Toprol . 6. Hyperlipidemia: Tolerating Crestor . Goal LDL < 55.   - Check lipids today.  7.  GI bleeding: With anemia.  She had episode in 10/22, has not had overt recurrence. She has been considered for Watchman.   - She wants to hold off on Watchman for now, will to consider with a close recurrence of GI bleeding.  8. Thrombus vs vegetation on ICD lead: w/u showed negative blood cx.  9. CKD stage 3: BMET today.   Followup in 4 wks.   I spent 41 minutes reviewing records, interviewing/examining patient, and managing orders.     Ezra Shuck  11/29/2023

## 2023-12-03 ENCOUNTER — Other Ambulatory Visit (HOSPITAL_COMMUNITY): Payer: Self-pay | Admitting: *Deleted

## 2023-12-03 DIAGNOSIS — I5022 Chronic systolic (congestive) heart failure: Secondary | ICD-10-CM

## 2023-12-04 ENCOUNTER — Ambulatory Visit (HOSPITAL_COMMUNITY)
Admission: RE | Admit: 2023-12-04 | Discharge: 2023-12-04 | Disposition: A | Discharge status: Home / Self Care | Source: Ambulatory Visit | Attending: Cardiology | Admitting: Cardiology

## 2023-12-04 DIAGNOSIS — I5022 Chronic systolic (congestive) heart failure: Secondary | ICD-10-CM | POA: Diagnosis not present

## 2023-12-08 ENCOUNTER — Ambulatory Visit (HOSPITAL_COMMUNITY)

## 2023-12-08 ENCOUNTER — Encounter (HOSPITAL_COMMUNITY): Payer: Self-pay

## 2023-12-09 ENCOUNTER — Encounter: Payer: Self-pay | Admitting: Internal Medicine

## 2023-12-09 ENCOUNTER — Ambulatory Visit (HOSPITAL_COMMUNITY): Attending: Cardiology

## 2023-12-09 DIAGNOSIS — I5022 Chronic systolic (congestive) heart failure: Secondary | ICD-10-CM

## 2023-12-10 ENCOUNTER — Telehealth (HOSPITAL_COMMUNITY): Payer: Self-pay

## 2023-12-11 DIAGNOSIS — I5022 Chronic systolic (congestive) heart failure: Secondary | ICD-10-CM | POA: Diagnosis not present

## 2023-12-15 ENCOUNTER — Telehealth (HOSPITAL_COMMUNITY): Payer: Self-pay

## 2023-12-15 NOTE — Telephone Encounter (Signed)
 Spoke to patient regarding procedure scheduled for tomorrow. Aware of time and place. Nothing to eat or drink after midnight. She is holding her eliquis  as directed.Aware of holding spironolactone  and torsemide   in am

## 2023-12-16 ENCOUNTER — Encounter (HOSPITAL_COMMUNITY): Admission: RE | Disposition: A | Payer: Self-pay | Source: Home / Self Care | Attending: Cardiology

## 2023-12-16 ENCOUNTER — Ambulatory Visit (HOSPITAL_COMMUNITY)
Admission: RE | Admit: 2023-12-16 | Discharge: 2023-12-16 | Disposition: A | Discharge status: Home / Self Care | Attending: Cardiology | Admitting: Cardiology

## 2023-12-16 ENCOUNTER — Encounter (HOSPITAL_COMMUNITY): Payer: Self-pay

## 2023-12-16 ENCOUNTER — Other Ambulatory Visit: Payer: Self-pay

## 2023-12-16 ENCOUNTER — Other Ambulatory Visit (HOSPITAL_COMMUNITY)

## 2023-12-16 DIAGNOSIS — I255 Ischemic cardiomyopathy: Secondary | ICD-10-CM | POA: Diagnosis not present

## 2023-12-16 DIAGNOSIS — Z7901 Long term (current) use of anticoagulants: Secondary | ICD-10-CM | POA: Insufficient documentation

## 2023-12-16 DIAGNOSIS — I4892 Unspecified atrial flutter: Secondary | ICD-10-CM | POA: Insufficient documentation

## 2023-12-16 DIAGNOSIS — I480 Paroxysmal atrial fibrillation: Secondary | ICD-10-CM | POA: Insufficient documentation

## 2023-12-16 DIAGNOSIS — N183 Chronic kidney disease, stage 3 unspecified: Secondary | ICD-10-CM | POA: Diagnosis not present

## 2023-12-16 DIAGNOSIS — Z955 Presence of coronary angioplasty implant and graft: Secondary | ICD-10-CM | POA: Insufficient documentation

## 2023-12-16 DIAGNOSIS — I340 Nonrheumatic mitral (valve) insufficiency: Secondary | ICD-10-CM | POA: Diagnosis not present

## 2023-12-16 DIAGNOSIS — I5022 Chronic systolic (congestive) heart failure: Secondary | ICD-10-CM | POA: Insufficient documentation

## 2023-12-16 DIAGNOSIS — I2510 Atherosclerotic heart disease of native coronary artery without angina pectoris: Secondary | ICD-10-CM | POA: Diagnosis not present

## 2023-12-16 DIAGNOSIS — I509 Heart failure, unspecified: Secondary | ICD-10-CM | POA: Diagnosis not present

## 2023-12-16 DIAGNOSIS — I252 Old myocardial infarction: Secondary | ICD-10-CM | POA: Insufficient documentation

## 2023-12-16 DIAGNOSIS — I272 Pulmonary hypertension, unspecified: Secondary | ICD-10-CM | POA: Insufficient documentation

## 2023-12-16 DIAGNOSIS — Z95810 Presence of automatic (implantable) cardiac defibrillator: Secondary | ICD-10-CM | POA: Insufficient documentation

## 2023-12-16 DIAGNOSIS — Z79899 Other long term (current) drug therapy: Secondary | ICD-10-CM | POA: Diagnosis not present

## 2023-12-16 DIAGNOSIS — E785 Hyperlipidemia, unspecified: Secondary | ICD-10-CM | POA: Insufficient documentation

## 2023-12-16 HISTORY — PX: RIGHT/LEFT HEART CATH AND CORONARY ANGIOGRAPHY: CATH118266

## 2023-12-16 LAB — POCT I-STAT EG7
Acid-Base Excess: 5 mmol/L — ABNORMAL HIGH (ref 0.0–2.0)
Acid-Base Excess: 5 mmol/L — ABNORMAL HIGH (ref 0.0–2.0)
Bicarbonate: 30.1 mmol/L — ABNORMAL HIGH (ref 20.0–28.0)
Bicarbonate: 30.2 mmol/L — ABNORMAL HIGH (ref 20.0–28.0)
Calcium, Ion: 1.17 mmol/L (ref 1.15–1.40)
Calcium, Ion: 1.17 mmol/L (ref 1.15–1.40)
HCT: 38 % (ref 36.0–46.0)
HCT: 39 % (ref 36.0–46.0)
Hemoglobin: 12.9 g/dL (ref 12.0–15.0)
Hemoglobin: 13.3 g/dL (ref 12.0–15.0)
O2 Saturation: 67 %
O2 Saturation: 70 %
Potassium: 3.9 mmol/L (ref 3.5–5.1)
Potassium: 3.9 mmol/L (ref 3.5–5.1)
Sodium: 138 mmol/L (ref 135–145)
Sodium: 138 mmol/L (ref 135–145)
TCO2: 31 mmol/L (ref 22–32)
TCO2: 32 mmol/L (ref 22–32)
pCO2, Ven: 45 mmHg (ref 44–60)
pCO2, Ven: 45.1 mmHg (ref 44–60)
pH, Ven: 7.433 — ABNORMAL HIGH (ref 7.25–7.43)
pH, Ven: 7.433 — ABNORMAL HIGH (ref 7.25–7.43)
pO2, Ven: 34 mmHg (ref 32–45)
pO2, Ven: 36 mmHg (ref 32–45)

## 2023-12-16 LAB — GLUCOSE, CAPILLARY: Glucose-Capillary: 121 mg/dL — ABNORMAL HIGH (ref 70–99)

## 2023-12-16 SURGERY — RIGHT/LEFT HEART CATH AND CORONARY ANGIOGRAPHY
Anesthesia: LOCAL

## 2023-12-16 MED ORDER — MIDAZOLAM HCL 2 MG/2ML IJ SOLN
INTRAMUSCULAR | Status: AC
  Filled 2023-12-16: qty 2

## 2023-12-16 MED ORDER — HEPARIN (PORCINE) IN NACL 1000-0.9 UT/500ML-% IV SOLN
INTRAVENOUS | Status: DC | PRN
  Administered 2023-12-16: 1000 mL

## 2023-12-16 MED ORDER — SODIUM CHLORIDE 0.9% FLUSH: 3.0000 mL | INTRAVENOUS | Status: DC | PRN

## 2023-12-16 MED ORDER — FENTANYL CITRATE (PF) 100 MCG/2ML IJ SOLN
INTRAMUSCULAR | Status: DC | PRN
  Administered 2023-12-16 (×2): 25 ug via INTRAVENOUS

## 2023-12-16 MED ORDER — IOHEXOL 350 MG/ML SOLN
INTRAVENOUS | Status: DC | PRN
  Administered 2023-12-16: 35 mL

## 2023-12-16 MED ORDER — APIXABAN 5 MG PO TABS: 5.0000 mg | ORAL_TABLET | Freq: Two times a day (BID) | ORAL | 6 refills | Status: AC

## 2023-12-16 MED ORDER — ASPIRIN 81 MG PO CHEW: 81.0000 mg | CHEWABLE_TABLET | ORAL | Status: AC

## 2023-12-16 MED ORDER — LIDOCAINE HCL (PF) 1 % IJ SOLN
INTRAMUSCULAR | Status: AC
  Filled 2023-12-16: qty 30

## 2023-12-16 MED ORDER — SODIUM CHLORIDE 0.9 % IV SOLN: 250.0000 mL | INTRAVENOUS | Status: DC | PRN

## 2023-12-16 MED ORDER — HEPARIN SODIUM (PORCINE) 1000 UNIT/ML IJ SOLN
INTRAMUSCULAR | Status: AC
  Filled 2023-12-16: qty 10

## 2023-12-16 MED ORDER — FREE WATER
250.0000 mL | Freq: Once | Status: AC
  Administered 2023-12-16: 250 mL via ORAL

## 2023-12-16 MED ORDER — LIDOCAINE HCL (PF) 1 % IJ SOLN
INTRAMUSCULAR | Status: DC | PRN
  Administered 2023-12-16 (×2): 2 mL via INTRADERMAL

## 2023-12-16 MED ORDER — ASPIRIN 81 MG PO CHEW
CHEWABLE_TABLET | ORAL | Status: AC
  Administered 2023-12-16: 81 mg via ORAL
  Filled 2023-12-16: qty 1

## 2023-12-16 MED ORDER — SODIUM CHLORIDE 0.9% FLUSH: 3.0000 mL | Freq: Two times a day (BID) | INTRAVENOUS | Status: DC

## 2023-12-16 MED ORDER — FENTANYL CITRATE (PF) 100 MCG/2ML IJ SOLN
INTRAMUSCULAR | Status: AC
  Filled 2023-12-16: qty 2

## 2023-12-16 MED ORDER — VERAPAMIL HCL 2.5 MG/ML IV SOLN
INTRAVENOUS | Status: AC
  Filled 2023-12-16: qty 2

## 2023-12-16 MED ORDER — MIDAZOLAM HCL (PF) 2 MG/2ML IJ SOLN
INTRAMUSCULAR | Status: DC | PRN
  Administered 2023-12-16 (×2): 1 mg via INTRAVENOUS

## 2023-12-16 MED ORDER — HEPARIN (PORCINE) IN NACL 2-0.9 UNITS/ML
INTRAMUSCULAR | Status: DC | PRN
  Administered 2023-12-16: 10 mL via INTRA_ARTERIAL

## 2023-12-16 MED ORDER — HEPARIN SODIUM (PORCINE) 1000 UNIT/ML IJ SOLN
INTRAMUSCULAR | Status: DC | PRN
  Administered 2023-12-16: 3000 [IU] via INTRAVENOUS

## 2023-12-16 SURGICAL SUPPLY — 9 items
CATH 5FR JL3.5 JR4 ANG PIG MP (CATHETERS) IMPLANT
CATH SWAN GANZ 7F STRAIGHT (CATHETERS) IMPLANT
DEVICE RAD TR BAND REGULAR (VASCULAR PRODUCTS) IMPLANT
GLIDESHEATH SLEND SS 6F .021 (SHEATH) IMPLANT
GLIDESHEATH SLENDER 7FR .021G (SHEATH) IMPLANT
GUIDEWIRE INQWIRE 1.5J.035X260 (WIRE) IMPLANT
KIT SYRINGE INJ CVI SPIKEX1 (MISCELLANEOUS) IMPLANT
PACK CARDIAC CATHETERIZATION (CUSTOM PROCEDURE TRAY) ×1 IMPLANT
SET ATX-X65L (MISCELLANEOUS) IMPLANT

## 2023-12-16 NOTE — Interval H&P Note (Signed)
 History and Physical Interval Note:  12/16/2023 12:27 PM  Catherine Mays  has presented today for surgery, with the diagnosis of heart failure.  The various methods of treatment have been discussed with the patient and family. After consideration of risks, benefits and other options for treatment, the patient has consented to  Procedure(s): RIGHT/LEFT HEART CATH AND CORONARY ANGIOGRAPHY (N/A) as a surgical intervention.  The patient's history has been reviewed, patient examined, no change in status, stable for surgery.  I have reviewed the patient's chart and labs.  Questions were answered to the patient's satisfaction.     Adiba Fargnoli [employer]

## 2023-12-16 NOTE — Discharge Instructions (Signed)
 Resume apixaban  tomorrow morning.

## 2023-12-17 ENCOUNTER — Other Ambulatory Visit (HOSPITAL_COMMUNITY): Payer: Self-pay | Admitting: Cardiology

## 2023-12-17 ENCOUNTER — Encounter (HOSPITAL_COMMUNITY): Payer: Self-pay | Admitting: Cardiology

## 2023-12-28 ENCOUNTER — Ambulatory Visit (HOSPITAL_COMMUNITY): Payer: Self-pay | Admitting: Cardiology

## 2023-12-28 ENCOUNTER — Encounter (HOSPITAL_COMMUNITY): Payer: Self-pay | Admitting: Cardiology

## 2023-12-28 ENCOUNTER — Ambulatory Visit (HOSPITAL_COMMUNITY)
Admission: RE | Admit: 2023-12-28 | Discharge: 2023-12-28 | Disposition: A | Discharge status: Home / Self Care | Source: Ambulatory Visit | Attending: Cardiology | Admitting: Cardiology

## 2023-12-28 VITALS — BP 102/60 | HR 77 | Wt 130.4 lb

## 2023-12-28 DIAGNOSIS — I081 Rheumatic disorders of both mitral and tricuspid valves: Secondary | ICD-10-CM | POA: Diagnosis not present

## 2023-12-28 DIAGNOSIS — Z79899 Other long term (current) drug therapy: Secondary | ICD-10-CM | POA: Diagnosis not present

## 2023-12-28 DIAGNOSIS — I480 Paroxysmal atrial fibrillation: Secondary | ICD-10-CM | POA: Diagnosis not present

## 2023-12-28 DIAGNOSIS — I252 Old myocardial infarction: Secondary | ICD-10-CM | POA: Diagnosis not present

## 2023-12-28 DIAGNOSIS — I5022 Chronic systolic (congestive) heart failure: Secondary | ICD-10-CM

## 2023-12-28 DIAGNOSIS — E785 Hyperlipidemia, unspecified: Secondary | ICD-10-CM | POA: Diagnosis not present

## 2023-12-28 DIAGNOSIS — I493 Ventricular premature depolarization: Secondary | ICD-10-CM | POA: Diagnosis not present

## 2023-12-28 DIAGNOSIS — Z7901 Long term (current) use of anticoagulants: Secondary | ICD-10-CM | POA: Diagnosis not present

## 2023-12-28 DIAGNOSIS — Z955 Presence of coronary angioplasty implant and graft: Secondary | ICD-10-CM | POA: Diagnosis not present

## 2023-12-28 DIAGNOSIS — I255 Ischemic cardiomyopathy: Secondary | ICD-10-CM | POA: Insufficient documentation

## 2023-12-28 DIAGNOSIS — I2510 Atherosclerotic heart disease of native coronary artery without angina pectoris: Secondary | ICD-10-CM | POA: Diagnosis not present

## 2023-12-28 DIAGNOSIS — N183 Chronic kidney disease, stage 3 unspecified: Secondary | ICD-10-CM | POA: Insufficient documentation

## 2023-12-28 LAB — BASIC METABOLIC PANEL WITH GFR
Anion gap: 12 (ref 5–15)
BUN: 20 mg/dL (ref 6–20)
CO2: 28 mmol/L (ref 22–32)
Calcium: 8.8 mg/dL — ABNORMAL LOW (ref 8.9–10.3)
Chloride: 98 mmol/L (ref 98–111)
Creatinine, Ser: 1.23 mg/dL — ABNORMAL HIGH (ref 0.44–1.00)
GFR, Estimated: 51 mL/min — ABNORMAL LOW (ref >60)
Glucose, Bld: 146 mg/dL — ABNORMAL HIGH (ref 70–99)
Potassium: 4 mmol/L (ref 3.5–5.1)
Sodium: 138 mmol/L (ref 135–145)

## 2023-12-28 LAB — DIGOXIN LEVEL: Digoxin Level: 1.2 ng/mL (ref 0.8–2.0)

## 2023-12-28 LAB — BRAIN NATRIURETIC PEPTIDE: B Natriuretic Peptide: 335.9 pg/mL — ABNORMAL HIGH (ref 0.0–100.0)

## 2023-12-28 MED ORDER — POTASSIUM CHLORIDE 20 MEQ PO PACK: 20.0000 meq | PACK | Freq: Every day | ORAL | 11 refills | Status: AC

## 2023-12-28 NOTE — Progress Notes (Signed)
 [facility] Note  PCP: Hyacinth Honey, NP HF Cardiology: Dr. Rolan  Chief complaint: CHF  [name] is a 58 y.o. with history of CAD s/p anterior STEMI with DES to LAD, ischemic cardiomyopathy, mitral regurgitation, and paroxysmal atrial fibrillation.  She was admitted in 7/17 with anterior STEMI.  She had a late presentation to the cath lab.  Echo in 10/17 showed EF about 25% with LAD-territory wall motion abnormalities.  She had peri-MI atrial fibrillation and was started on Xarelto .  She was put on amiodarone .   After discharge, she continued to feel poorly.  She developed nausea, anorexia, and significant fatigue.  She was admitted for evaluation in 10/17 given concern for low output heart failure.  However, stopping amiodarone  essentially resolved her symptoms.  She had RHC showing preserved cardiac output but the PCWP was high due to prominent v-waves, presumably from significant mitral regurgitation.  Of note, she was in atrial fibrillation for a time during this admission.   She had St Jude ICD placed.  She is back at work for the [employer].   TEE in 8/18 to evaluate the mitral valve showed EF 30%, moderate MR (probably functional).    Patient was seen by Dr. Inocencio and noted to have very frequent PVCs on device interrogation.  Holter in 12/18 showed 41% PVCs.  She was started on sotalol  80 mg bid and titrated up to 120 mg bid.  Coreg  was stopped with the addition of sotalol  and losartan  was decreased to 12.5 mg daily due to low BP.  She had PVC ablation in 2/19 but PVCs recurred. Sotalol  was increased to 160 mg bid.   Echo 8/19 showed EF 30% with regional wall motion abnormalities, normal RV size and systolic function, moderate MR.   She was admitted in 10/19 with chest pain.  Cath showed patent LAD stent and no obstructive CAD.  She was found to be anemic with Fe deficiency (hgb down to 8).  FOBT+. She has had IV Fe and blood transfusion.  EGD  and colonoscopy this year were fairly unremarkable.  She had diverticulosis.  Capsule endoscopy negative in 9/21.   Echo in 1/22 showed EF 25-30%, wall motion abnormalities in LAD distribution, mild-moderate MR, normal RV.    Admitted 10/22 with anemia, hemoglobin of 8.6 (baseline around 10-11). She was transfused 1 unit of PRBC on 11/29/2020. GI was consulted and she underwent EGD on 11/30/2020 which showed no source of bleeding.  Capsule endoscopy on 12/01/2020 showed blood in the mid/distal small bowel and proximal colon.  Colonoscopy on 12/02/2020 showed blood in the terminal ileum but no clear source.  It is suspected that the source of bleeding is in the distal small bowel that is not able to be reached via routine colonoscope.  GI asked IR to consider an arteriogram to localize and treat the source of her bleeding. CT done and felt best option was DBE with Dr. Charlean at [place]. Double balloon endoscopy was done in 12/22, no definite cause of GI bleeding was found.   Echo in 3/23 showed EF 25-30%, apical akinesis, mild MR, normal RV, normal IVC. Echo was done today and reviewed, EF 25% with LAD territory akinesis, mild LV dilation, mild MR, RV normal, IVC normal.   Admitted 11/24 with hypotension and rate controlled AF. + orthostatics. GDMT & sotalol  held. Spontaneously converted to NSR. Planned to hold on sotalol  and start on Tikosyn  inpatient the following week. Planned admission 12/24 for Tikosyn   loading. Had waxing/waning PVC burden, which improved with addition of low dose Toprol . She was discharged home, remained off Entresto  with low BP.  Seen in ED 02/11/23 for urgent DCCV, successful restoration of NSR.  Admitted 1/25 with symptomatic Afib with hypotension. Planned TEE/DCCV however she converted to NSR after intubation/coughing.  TEE showed EF 20-25%, normal RV, moderate MR and TR, ? Mobile density on RA lead ? thrombus. Bc negative. Underwent AV node ablation and upgrade to CRT-D. Per EP, plan to  let leads mature and arrange for AF ablation in 6 months.   Seen in ED 05/31/23 with weakness & fatigue. BNP 995 and given IV lasix  and discharged home.   Saw EP 06/10/23 and in AFL. Did not pace out as she could transition to AF. Arranged for DCCV.  In 6/25, she had atrial fibrillation/flutter ablation.  TEE in 6/25 showed EF 20-25%, moderate LV dilation, normal RV, severe MR, severe TR, IVC normal.   Echo in 10/25 showed EF 20-25%, severe LV dilation, RV normal, PASP 59 mmHg, severe biatrial enlargement, moderate MR, moderate TR, IVC dilated.  Due to worsening symptoms and volume overloaded, she was started on digoxin  and torsemide  was increased.  RHC/LHC in 10/25 showed nonobstructive CAD and normal filling pressures with preserved cardiac output after med changes.  CPX showed mild-moderate functional limitation due to HF.   She returns for followup of CHF.  She is feeling much better since increasing torsemide  and starting on digoxin .  Only getting fatigued/short of breath with heavy exertion now.  Does fine with her normal activities.  No orthopnea/PND.  No lightheadedness, BP higher than usual today.  She says BP has been running higher since starting digoxin .  No chest pain.    ECG (personally reviewed): NSR, BiV pacing, paced QTc 497 msec  St Jude device interrogation (personally reviewed):  Stable thoracic impedance by Corvue, BiV pacing > 99%. 85% A-paced, no AF/VT.   Labs (6/24): K 4, creatinine 1.21, BNP 304 Labs (8/24): LDL 55 Labs (12/24): K 4.5, creatinine 1.01 Labs (2/25): K 4.3, creatinine 1.02 Labs (4/25): K 4.2, creatinine 1.27 Labs (9/25): K 3.7, creatinine 1.43 Labs (10/25): LDL 47, K 4.5, creatinine 8.63, hgb 13.8  PMH:  1. CAD: Anterior STEMI in 7/17 with late presentation to the cath lab. She had DES to proximal LAD.  No significant disease in other vessels.  - LHC (10/19): Patent LAD stent, no obstructive disease.  - LHC (10/25): Patent LAD stent, no obstructive  disease.  2. Chronic systolic CHF: Ischemic cardiomyopathy. St Jude CRT-D device.  - Echo 10/17 with EF 25%, akinesis of the mid-apical anteroseptal and anterior walls, apical dyskinesis, moderate to severe MR with incomplete coaptation.  - RHC (10/17): mean RA 4, PA 59/17 mean 38, mean PCWP 27 with prominent V waves suggesting significant MR, CI 3.06, PVR 2 WU.  - ACEI cough.  - Echo (11/17): EF 30-35%, normal RV size and systolic function, severe MR with restriction of posterior leaflet.   - Echo (8/19): EF 30% with regional wall motion abnormalities, normal RV size and systolic function, moderate MR.  - Echo (12/20): EF 30%, LAD wall motion abnormalities, normal RV, PASP 35, moderate functional MR.  - Echo (1/22): EF 25-30%, wall motion abnormalities in LAD distribution, mild-moderate MR, normal RV.  - Echo (3/23): EF 25-30%, apical akinesis, mild MR, normal RV, normal IVC.  - Echo (9/24): EF 25% with LAD territory akinesis, mild LV dilation, mild MR, RV normal, IVC normal.  -  TEE 1/25: EF 20-25%, Rv normal, mobile density on RA lead/? Thrombus, moderate MR, moderate TR - Echo (10/25): EF 20-25%, severe LV dilation, RV normal, PASP 59 mmHg, severe biatrial enlargement, moderate MR, moderate TR, IVC dilated.  - RHC (10/25): Mean RA 5, PA 44/20 mean 25, mean PCWP 12, LVEDP 14, CI 2.6 F/2.4 T, PAPi 4.8, PVR 3 WU.  - CPX (10/25): mild-moderate HF limitation; peak VO2 20.8, RER 1.3, VE/VCO2 slope 39 3. Atrial fibrillation: Paroxysmal.  Noted 7/17 admission peri-MI, also noted again 10/17 admission. She did not tolerate amiodarone  due to nausea/anorexia.  - Started on Tikosyn  (12/24) - DCCV 12/24 to NSR - s/p AVN ablation 2/25 - AF ablation in 6/25.  4. Mitral regurgitation: Moderate to severe on 10/17 echo, incomplete coaptation.  ?Etiology, her MI (anterior) does not typically lead to ischemic MR.  - Echo in 11/17 with severe MR, posterior leaflet restricted.  - TEE (8/18): EF 30%, moderately  dilated LV with akinetic septal and anterior walls, moderate functional MR, normal RV size and systolic function.  - Echo (8/19): Moderate MR - Echo (3/23): Mild MR.  - Echo (9/24): Mild MR. - TEE (1/25): moderate MR 5. Hyperlipidemia: Myalgias with atorvastatin .  6. PVCs: Holter 12/18 with 41% PVCs.  - PVC ablation in 2/19 with recurrence of PVCs.  7. GI bleeding:  - EGD (4/21): unremarkable.  - Colonoscopy (7/21): Diverticulosis.  - Capsule endoscopy (9/21): negative.  - Double balloon endoscopy at [facility] in 12/22 did not show cause for bleeding.  8. CKD stage 3  SH: Married, lives in [place], works for the [employer], no smoking or ETOH.   Family History  Problem Relation Age of Onset   Arrhythmia Mother    Lung cancer Mother        bronchiectasis   Heart failure Father    Heart attack Father    Heart attack Brother    Arrhythmia Brother    Colon cancer Neg Hx    Colon polyps Neg Hx    ROS: All systems reviewed and negative except as per HPI.   Current Outpatient Medications on File Prior to Encounter  Medication Sig Dispense Refill   acetaminophen  (TYLENOL ) 500 MG tablet Take 1,000 mg by mouth every 6 (six) hours as needed for mild pain.     apixaban  (ELIQUIS ) 5 MG TABS tablet Take 1 tablet (5 mg total) by mouth 2 (two) times daily. Resume Sunday 2/9 60 tablet 6   buPROPion  (WELLBUTRIN  XL) 150 MG 24 hr tablet Take 150 mg by mouth daily.     Calcium  Carb-Cholecalciferol (CALCIUM  + D3 PO) Take 1,200 mg by mouth every morning. Slow release     Coenzyme Q10 (COQ-10) 200 MG CAPS Take 200 mg by mouth daily.     digoxin  (LANOXIN ) 0.125 MG tablet Take 1 tablet (0.125 mg total) by mouth daily. 90 tablet 3   dofetilide  (TIKOSYN ) 250 MCG capsule TAKE 1 CAPSULE BY MOUTH TWICE DAILY 180 capsule 3   esomeprazole  (NEXIUM ) 40 MG capsule TAKE 1 CAPSULE(40 MG) BY MOUTH TWICE DAILY BEFORE A MEAL 60 capsule 3   ferrous sulfate  325 (65 FE) MG EC tablet Take 325 mg by mouth daily.     fexofenadine  (ALLEGRA) 180 MG tablet Take 180 mg by mouth daily.     levothyroxine  (SYNTHROID ) 125 MCG tablet Take 125 mcg by mouth daily before breakfast.     metoprolol  succinate (TOPROL -XL) 25 MG 24 hr tablet TAKE 1 TABLET BY MOUTH EVERY NIGHT AT  BEDTIME 30 tablet 5   mometasone (NASONEX) 50 MCG/ACT nasal spray Place 2 sprays into the nose daily.     nitroGLYCERIN  (NITROSTAT ) 0.4 MG SL tablet DISSOLVE 1 TABLET UNDER THE TONGUE EVERY 5 MINUTES AS NEEDED FOR CHEST PAIN 25 tablet 0   rosuvastatin  (CRESTOR ) 20 MG tablet TAKE 1 TABLET(20 MG) BY MOUTH DAILY 90 tablet 1   sertraline  (ZOLOFT ) 50 MG tablet Take 50 mg by mouth daily.     spironolactone  (ALDACTONE ) 25 MG tablet TAKE 1 TABLET(25 MG) BY MOUTH DAILY 30 tablet 11   torsemide  (DEMADEX ) 20 MG tablet Take 2 tablets (40 mg total) by mouth every morning AND 1 tablet (20 mg total) every evening. 90 tablet 3   traZODone  (DESYREL ) 50 MG tablet Take 25 mg by mouth at bedtime as needed for sleep.     No current facility-administered medications on file prior to encounter.   Wt Readings from Last 3 Encounters:  12/28/23 59.1 kg (130 lb 6.4 oz)  12/16/23 59 kg (130 lb)  11/27/23 60.6 kg (133 lb 9.6 oz)   BP 102/60   Pulse 77   Wt 59.1 kg (130 lb 6.4 oz)   SpO2 98%   BMI 19.26 kg/m   PHYSICAL EXAM: General: NAD Neck: No JVD, no thyromegaly or thyroid  nodule.  Lungs: Clear to auscultation bilaterally with normal respiratory effort. CV: Lateral PMI.  Heart regular S1/S2, no S3/S4, no murmur.  No peripheral edema.  No carotid bruit.  Normal pedal pulses.  Abdomen: Soft, nontender, no hepatosplenomegaly, no distention.  Skin: Intact without lesions or rashes.  Neurologic: Alert and oriented x 3.  Psych: Normal affect. Extremities: No clubbing or cyanosis.  HEENT: Normal.   Assessment/Plan: 1. Atrial fibrillation/AFL: Unable to tolerate amiodarone  due to GI side effects. She failed sotalol . Now on Tikosyn . Had AV nodal ablation with CRT.  Now s/p AF  ablation in 6/25. She is in NSR today, no AF on device interrogation.  - Continue Tikosyn  per EP. Paced QTc ok.  - Continue Toprol -XL 25 mg at bedtime - Continue Eliquis  5 mg bid.  2. CAD: S/p anterior MI in 7/17 with DES to RCA.  Cath in 10/25 with no obstructive CAD.  No chest pain.  - No ASA given need for Eliquis  and h/o GIB  - Continue Crestor , good lipids in 10/25.  3. Chronic systolic CHF: Ischemic cardiomyopathy.  EF 25% on echo in 10/17, 30-35% on echo in 11/17, 30% on TEE in 8/18. She has extensive LAD-territory scar. She has a [employer] ICD.  Echo in 3/23 showed that EF remained 25-30% with LAD-territory scar. Echo 9/24 showed stable EF 25% with LAD territory akinesis, mild LV dilation, mild MR, RV normal, IVC normal. TEE (1/25) with EF 20-25%, normal RV, moderate MR and TR. With wide LBBB, now s/p CRT-D upgrade. Echo today showed EF 20-25%, severe LV dilation, RV normal, PASP 59 mmHg, severe biatrial enlargement, moderate MR, moderate TR, IVC dilated. RHC in 10/25 after starting digoxin  and increasing torsemide  showed normal filling pressures and preserved cardiac output.  CPX in 10/25 with mild-moderate functional limitation due to HF.  BP chronically low, has limited GDMT. Seems to be better on digoxin . NYHA class II currently but III at last appointment.  Now doing better, not volume overloaded by exam or Corvue.  - Continue torsemide  40 qam/20 qpm. BMET/BNP today.  - Continue digoxin  0.125 daily, check level today.   - Continue spironolactone  25 mg daily.  - Continue Toprol  XL  25 mg at bedtime. - No BP room to start ARB/ARNi. - She did not tolerate dapagliflozin  due to yeast infections.  - I am going to refer her to [facility] to see if she could get a barostimulator device.  4. Mitral regurgitation: Moderate to severe on 10/17 echo, severe on 11/17 echo.  Underwent TEE in 8/18.  This showed moderate MR that appeared to be functional.  No indication for surgery or Mitraclip. Echo in 3/23 showed  mild MR. Echo 9/24 with mild MR. TEE this admission (1/25) with moderate MR. Echo 10/25 with moderate MR.  5. PVCs: Very frequent on 12/18 holter (41% beats), she has had a PVC ablation in 2/19. Due to recurrence of PVCs post-ablation, she was placed on sotalol . Unfortunately, had break through AF, with PVCs. Now off sotalol  (and started on Tikosyn  for AF). Asymptomatic.  - Continue Toprol . 6. Hyperlipidemia: Tolerating Crestor . Goal LDL < 55.  Good lipids 10/25.  7. GI bleeding: With anemia.  She had episode in 10/22, has not had overt recurrence. She has been considered for Watchman.   - She wants to hold off on Watchman for now, will to consider with a close recurrence of GI bleeding.  8. Thrombus vs vegetation on ICD lead: w/u showed negative blood cx.  9. CKD stage 3: BMET today.   Followup in 3 month with APP.    I spent 31 minutes reviewing records, interviewing/examining patient, and managing orders.    Ezra Shuck  12/28/2023

## 2023-12-28 NOTE — Patient Instructions (Signed)
 There has been no changes to your medications.  Labs done today, your results will be available in MyChart, we will contact you for abnormal readings.  You have been referred to Dr. Serene. His office will call you to arrange your appointment.  Your physician recommends that you schedule a follow-up appointment in: 3 months.  If you have any questions or concerns before your next appointment please send us  a message through [facility] or call our office at [phone].    TO LEAVE A MESSAGE FOR THE NURSE SELECT OPTION 2, PLEASE LEAVE A MESSAGE INCLUDING: YOUR NAME DATE OF BIRTH CALL BACK NUMBER REASON FOR CALL**this is important as we prioritize the call backs  YOU WILL RECEIVE A CALL BACK THE SAME DAY AS LONG AS YOU CALL BEFORE 4:00 PM  At the [facility], you and your health needs are our priority. As part of our continuing mission to provide you with exceptional heart care, we have created designated Provider Care Teams. These Care Teams include your primary Cardiologist (physician) and Advanced Practice Providers (APPs- Physician Assistants and Nurse Practitioners) who all work together to provide you with the care you need, when you need it.   You may see any of the following providers on your designated Care Team at your next follow up: Dr Toribio Fuel Dr Ezra Shuck Dr. Morene Brownie Greig Mosses, NP Caffie Shed, [place] [facility], [place] Beckey Coe, NP Jordan Lee, NP Ellouise Class, NP Tinnie Redman, PharmD Jaun Bash, PharmD   Please be sure to bring in all your medications bottles to every appointment.    Thank you for choosing [facility]

## 2023-12-31 ENCOUNTER — Other Ambulatory Visit: Payer: Self-pay | Admitting: *Deleted

## 2023-12-31 DIAGNOSIS — I509 Heart failure, unspecified: Secondary | ICD-10-CM

## 2023-12-31 DIAGNOSIS — Z01818 Encounter for other preprocedural examination: Secondary | ICD-10-CM

## 2024-01-04 ENCOUNTER — Ambulatory Visit (HOSPITAL_COMMUNITY)
Admission: RE | Admit: 2024-01-04 | Discharge: 2024-01-04 | Disposition: A | Discharge status: Home / Self Care | Source: Ambulatory Visit | Attending: Surgery | Admitting: Surgery

## 2024-01-04 ENCOUNTER — Ambulatory Visit: Attending: Surgery | Admitting: Surgery

## 2024-01-04 ENCOUNTER — Encounter: Payer: Self-pay | Admitting: Surgery

## 2024-01-04 VITALS — BP 95/64 | HR 70 | Temp 98.0°F | Ht 69.0 in | Wt 131.0 lb

## 2024-01-04 DIAGNOSIS — I5042 Chronic combined systolic (congestive) and diastolic (congestive) heart failure: Secondary | ICD-10-CM | POA: Diagnosis not present

## 2024-01-04 DIAGNOSIS — Z01818 Encounter for other preprocedural examination: Secondary | ICD-10-CM | POA: Insufficient documentation

## 2024-01-04 DIAGNOSIS — I509 Heart failure, unspecified: Secondary | ICD-10-CM | POA: Insufficient documentation

## 2024-01-04 NOTE — Progress Notes (Signed)
 [facility] of [facility]  Patient name: Catherine Mays MRN: [id number] DOB: 1965-08-14 Sex: female   REQUESTING PROVIDER:    Dr. Rolan   REASON FOR CONSULT:    Barostim evaluation  HISTORY OF PRESENT ILLNESS:   Catherine Mays is a 58 y.o. female, who is referred for evaluation of a Barostim implant.  The patient suffers of coronary artery disease status post STEMI with PCI to the LAD.  She has ischemic cardiomyopathy atrial fibrillation and mitral regurgitation.  Her ejection fraction is around 25%.  She has a Retail Buyer CRT-D-D in place.  Ever since switching to digoxin  for her A-fib, she has been feeling a lot better.  She takes a statin for hypercholesterolemia.  She is on Eliquis  for her A-fib.  She is medically managed for hypertension.  PAST MEDICAL HISTORY    Past Medical History:  Diagnosis Date   AICD (automatic cardioverter/defibrillator) present    St. Jude   Anemia    CAD in native artery    a. late recognition of presentation of STEMI 08/2015 s/p DES to LAD, mild residual mRCA.   Chronic systolic CHF (congestive heart failure) (HCC)    CKD (chronic kidney disease), stage III (HCC)    Depression    Hypertension    Hypotension    a. preventing med titration for HF.   Hypothyroidism 09/24/2015   Ischemic cardiomyopathy    Myocardial infarction [facility]) 08/2015   PAF (paroxysmal atrial fibrillation) (HCC) 09/24/2015   a. dx at time of STEMI 08/2015.   Pre-diabetes    Presence of permanent cardiac pacemaker    patient has ICD   PVC's (premature ventricular contractions)      FAMILY HISTORY   Family History  Problem Relation Age of Onset   Arrhythmia Mother    Lung cancer Mother        bronchiectasis   Heart failure Father    Heart attack Father    Heart attack Brother    Arrhythmia Brother    Colon cancer Neg Hx    Colon polyps Neg Hx     SOCIAL HISTORY:   Social History   Socioeconomic  History   Marital status: Married    Spouse name: Not on file   Number of children: Not on file   Years of education: Not on file   Highest education level: Not on file  Occupational History   Not on file  Tobacco Use   Smoking status: Never   Smokeless tobacco: Never   Tobacco comments:    Never smoked 01/21/23  Vaping Use   Vaping status: Never Used  Substance and Sexual Activity   Alcohol use: No   Drug use: No   Sexual activity: Not on file  Other Topics Concern   Not on file  Social History Narrative   Not on file   Social Drivers of Health   Financial Resource Strain: Low Risk  (02/10/2020)   Overall Financial Resource Strain (CARDIA)    Difficulty of Paying Living Expenses: Not hard at all  Food Insecurity: No Food Insecurity (03/27/2023)   Hunger Vital Sign    Worried About Running Out of Food in the Last Year: Never true    Ran Out of Food in the Last Year: Never true  Transportation Needs: No Transportation Needs (03/27/2023)   PRAPARE - Administrator, Civil Service (Medical): No    Lack of Transportation (Non-Medical): No  Physical Activity: Insufficiently Active (02/10/2020)  Exercise Vital Sign    Days of Exercise per Week: 3 days    Minutes of Exercise per Session: 30 min  Stress: No Stress Concern Present (02/10/2020)   [employer] of Occupational Health - Occupational Stress Questionnaire    Feeling of Stress : Not at all  Social Connections: Socially Integrated (03/27/2023)   Social Connection and Isolation Panel    Frequency of Communication with Friends and Family: Three times a week    Frequency of Social Gatherings with Friends and Family: Three times a week    Attends Religious Services: More than 4 times per year    Active Member of Clubs or Organizations: Yes    Attends Banker Meetings: More than 4 times per year    Marital Status: Married  Catering Manager Violence: Not At Risk (03/27/2023)   Humiliation,  Afraid, Rape, and Kick questionnaire    Fear of Current or Ex-Partner: No    Emotionally Abused: No    Physically Abused: No    Sexually Abused: No    ALLERGIES:    Allergies  Allergen Reactions   Paxil [Paroxetine] Palpitations   Amiodarone  Nausea And Vomiting   Chlorhexidine  Gluconate Itching   Morphine  And Codeine Nausea And Vomiting   Percocet [Oxycodone -Acetaminophen ] Nausea And Vomiting   Cefdinir Nausea Only   Zolpidem  Other (See Comments)    Doesn't work for patient at all    CURRENT MEDICATIONS:    Current Outpatient Medications  Medication Sig Dispense Refill   acetaminophen  (TYLENOL ) 500 MG tablet Take 1,000 mg by mouth every 6 (six) hours as needed for mild pain.     apixaban  (ELIQUIS ) 5 MG TABS tablet Take 1 tablet (5 mg total) by mouth 2 (two) times daily. Resume Sunday 2/9 60 tablet 6   buPROPion  (WELLBUTRIN  XL) 150 MG 24 hr tablet Take 150 mg by mouth daily.     Calcium  Carb-Cholecalciferol (CALCIUM  + D3 PO) Take 1,200 mg by mouth every morning. Slow release     Coenzyme Q10 (COQ-10) 200 MG CAPS Take 200 mg by mouth daily.     digoxin  (LANOXIN ) 0.125 MG tablet Take 1 tablet (0.125 mg total) by mouth daily. 90 tablet 3   dofetilide  (TIKOSYN ) 250 MCG capsule TAKE 1 CAPSULE BY MOUTH TWICE DAILY 180 capsule 3   esomeprazole  (NEXIUM ) 40 MG capsule TAKE 1 CAPSULE(40 MG) BY MOUTH TWICE DAILY BEFORE A MEAL 60 capsule 3   ferrous sulfate  325 (65 FE) MG EC tablet Take 325 mg by mouth daily.     fexofenadine (ALLEGRA) 180 MG tablet Take 180 mg by mouth daily.     levothyroxine  (SYNTHROID ) 125 MCG tablet Take 125 mcg by mouth daily before breakfast.     metoprolol  succinate (TOPROL -XL) 25 MG 24 hr tablet TAKE 1 TABLET BY MOUTH EVERY NIGHT AT BEDTIME 30 tablet 5   mometasone (NASONEX) 50 MCG/ACT nasal spray Place 2 sprays into the nose daily.     nitroGLYCERIN  (NITROSTAT ) 0.4 MG SL tablet DISSOLVE 1 TABLET UNDER THE TONGUE EVERY 5 MINUTES AS NEEDED FOR CHEST PAIN 25 tablet  0   potassium chloride  (KLOR-CON ) 20 MEQ packet Take 20 mEq by mouth daily. 30 packet 11   rosuvastatin  (CRESTOR ) 20 MG tablet TAKE 1 TABLET(20 MG) BY MOUTH DAILY 90 tablet 1   sertraline  (ZOLOFT ) 50 MG tablet Take 50 mg by mouth daily.     spironolactone  (ALDACTONE ) 25 MG tablet TAKE 1 TABLET(25 MG) BY MOUTH DAILY 30 tablet 11   torsemide  (  DEMADEX ) 20 MG tablet Take 2 tablets (40 mg total) by mouth every morning AND 1 tablet (20 mg total) every evening. 90 tablet 3   traZODone  (DESYREL ) 50 MG tablet Take 25 mg by mouth at bedtime as needed for sleep.     No current facility-administered medications for this visit.    REVIEW OF SYSTEMS:   [X]  denotes positive finding, [ ]  denotes negative finding Cardiac  Comments:  Chest pain or chest pressure:    Shortness of breath upon exertion:    Short of breath when lying flat:    Irregular heart rhythm:        Vascular    Pain in calf, thigh, or hip brought on by ambulation:    Pain in feet at night that wakes you up from your sleep:     Blood clot in your veins:    Leg swelling:         Pulmonary    Oxygen at home:    Productive cough:     Wheezing:         Neurologic    Sudden weakness in arms or legs:     Sudden numbness in arms or legs:     Sudden onset of difficulty speaking or slurred speech:    Temporary loss of vision in one eye:     Problems with dizziness:         Gastrointestinal    Blood in stool:      Vomited blood:         Genitourinary    Burning when urinating:     Blood in urine:        Psychiatric    Major depression:         Hematologic    Bleeding problems:    Problems with blood clotting too easily:        Skin    Rashes or ulcers:        Constitutional    Fever or chills:     PHYSICAL EXAM:   Vitals:   01/04/24 1023  BP: 95/64  Pulse: 70  Temp: 98 F (36.7 C)  SpO2: 96%  Weight: 131 lb (59.4 kg)  Height: 5' 9 (1.753 m)    GENERAL: The patient is a well-nourished female, in no  acute distress. The vital signs are documented above. CARDIAC: There is a regular rate and rhythm.  VASCULAR: Palpable pedal pulse PULMONARY: Nonlabored respirations MUSCULOSKELETAL: There are no major deformities or cyanosis. NEUROLOGIC: No focal weakness or paresthesias are detected. SKIN: There are no ulcers or rashes noted. PSYCHIATRIC: The patient has a normal affect.  STUDIES:   I have reviewed the following:  Right Carotid: The extracranial vessels were near-normal with only minimal  wall                thickening or plaque.   Left Carotid: The extracranial vessels were near-normal with only minimal  wall               thickening or plaque.   Vertebrals:  Bilateral vertebral arteries demonstrate antegrade flow.  Subclavians: Normal flow hemodynamics were seen in bilateral subclavian               arteries.  ASSESSMENT and PLAN   NYHA class II/III: The patient continues to have challenges despite goal-directed medical therapy.  She still complains of fatigue and lack of energy.  We have recommended proceeding with a right sided Barostim.  The details of  the procedure discussed with the patient and her husband.  All questions were answered.   Malvina Serene CLORE, MD, FACS [facility] [phone] Pager [phone]

## 2024-01-08 ENCOUNTER — Ambulatory Visit (HOSPITAL_COMMUNITY)

## 2024-01-13 ENCOUNTER — Ambulatory Visit (HOSPITAL_COMMUNITY)
Admission: RE | Admit: 2024-01-13 | Discharge: 2024-01-13 | Disposition: A | Discharge status: Home / Self Care | Source: Ambulatory Visit | Attending: Cardiology | Admitting: Cardiology

## 2024-01-13 ENCOUNTER — Ambulatory Visit (HOSPITAL_COMMUNITY): Payer: Self-pay | Admitting: Cardiology

## 2024-01-13 DIAGNOSIS — I5022 Chronic systolic (congestive) heart failure: Secondary | ICD-10-CM | POA: Diagnosis present

## 2024-01-13 LAB — DIGOXIN LEVEL: Digoxin Level: 0.8 ng/mL (ref 0.8–2.0)

## 2024-01-14 ENCOUNTER — Encounter: Payer: Self-pay | Admitting: Cardiology

## 2024-01-20 ENCOUNTER — Other Ambulatory Visit: Payer: Self-pay

## 2024-01-26 ENCOUNTER — Telehealth: Payer: Self-pay

## 2024-01-26 NOTE — Telephone Encounter (Signed)
 Attempted to reach pt to let her know she has an active lab order that has been entered. I left her a VM to call us  back.

## 2024-01-27 ENCOUNTER — Ambulatory Visit

## 2024-01-27 DIAGNOSIS — I5022 Chronic systolic (congestive) heart failure: Secondary | ICD-10-CM

## 2024-01-28 ENCOUNTER — Other Ambulatory Visit: Payer: Self-pay | Admitting: *Deleted

## 2024-01-28 ENCOUNTER — Telehealth: Payer: Self-pay | Admitting: *Deleted

## 2024-01-28 ENCOUNTER — Ambulatory Visit (INDEPENDENT_AMBULATORY_CARE_PROVIDER_SITE_OTHER): Admitting: Gastroenterology

## 2024-01-28 ENCOUNTER — Encounter: Payer: Self-pay | Admitting: Gastroenterology

## 2024-01-28 VITALS — BP 97/66 | HR 72 | Temp 98.7°F | Ht 69.0 in | Wt 135.2 lb

## 2024-01-28 DIAGNOSIS — I5022 Chronic systolic (congestive) heart failure: Secondary | ICD-10-CM

## 2024-01-28 DIAGNOSIS — R1319 Other dysphagia: Secondary | ICD-10-CM | POA: Insufficient documentation

## 2024-01-28 DIAGNOSIS — R1310 Dysphagia, unspecified: Secondary | ICD-10-CM

## 2024-01-28 DIAGNOSIS — K219 Gastro-esophageal reflux disease without esophagitis: Secondary | ICD-10-CM

## 2024-01-28 LAB — CUP PACEART REMOTE DEVICE CHECK
Battery Remaining Longevity: 79 mo
Battery Remaining Percentage: 86 %
Battery Voltage: 2.98 V
Brady Statistic AP VP Percent: 88 %
Brady Statistic AP VS Percent: 1 %
Brady Statistic AS VP Percent: 11 %
Brady Statistic AS VS Percent: 1 %
Brady Statistic RA Percent Paced: 87 %
Date Time Interrogation Session: 20251203020709
HighPow Impedance: 66 Ohm
Implantable Lead Connection Status: 753985
Implantable Lead Connection Status: 753985
Implantable Lead Connection Status: 753985
Implantable Lead Implant Date: 20240219
Implantable Lead Implant Date: 20250203
Implantable Lead Implant Date: 20250203
Implantable Lead Location: 753858
Implantable Lead Location: 753859
Implantable Lead Location: 753860
Implantable Pulse Generator Implant Date: 20250203
Lead Channel Impedance Value: 400 Ohm
Lead Channel Impedance Value: 430 Ohm
Lead Channel Impedance Value: 740 Ohm
Lead Channel Pacing Threshold Amplitude: 0.75 V
Lead Channel Pacing Threshold Amplitude: 0.875 V
Lead Channel Pacing Threshold Amplitude: 1.5 V
Lead Channel Pacing Threshold Pulse Width: 0.3 ms
Lead Channel Pacing Threshold Pulse Width: 0.5 ms
Lead Channel Pacing Threshold Pulse Width: 0.5 ms
Lead Channel Sensing Intrinsic Amplitude: 12 mV
Lead Channel Sensing Intrinsic Amplitude: 5 mV
Lead Channel Setting Pacing Amplitude: 1.875
Lead Channel Setting Pacing Amplitude: 2 V
Lead Channel Setting Pacing Amplitude: 2 V
Lead Channel Setting Pacing Pulse Width: 0.3 ms
Lead Channel Setting Pacing Pulse Width: 0.5 ms
Lead Channel Setting Sensing Sensitivity: 0.5 mV
Pulse Gen Serial Number: 211038341
Zone Setting Status: 755011

## 2024-01-28 NOTE — H&P (View-Only) (Signed)
 Gastroenterology Office Note     Primary Care Physician:  Hyacinth Honey, NP  Primary Gastroenterologist: Dr Cindie   Chief Complaint   Chief Complaint  Patient presents with   Follow-up    Still having issues with tightness in her throat     History of Present Illness   Catherine Mays is a 58 y.o. female presenting today with a history of CAD, prior STEMI with PCI to LAD, ischemic cardiomyopathy, afib, mitral regurgitation, chronic systolic CHF, AICD, afib s/p ablation, recent cardiac cath Oct 2025, EF 20-25% chronically, mild pulmonary hypertension, on Eliquis  BID, chronic GERD, dysphagia, last seen in Aug 2025 for dyspepsia and dysphagia. Cardiac clearance had been requested but needed to postpone procedure as she had an ablation June 2025 and needed to have uninterrupted anticoagulation until end of Sept 2025. Returns today to arrange procedure.  Previously had been on potassium pill but now taking a packet instead. Feels this has helped with dysphagia symptoms.   Nexium  BID resolved abdominal pain. This has also helped slightly with dysphagia but not completed resolved.   Cold water  will cause constriction feeling of esophagus and feels pills very slowly trying to slide down esophagus. Like an engineer, structural. Still with intermittent solid food dysphagia.   Ablation June 2025.   Oct 2025:  Ost LAD lesion is 40% stenosed.   Prox LAD to Mid LAD lesion is 10% stenosed.   1st Diag lesion is 60% stenosed.   1. Patent proximal LAD stent, nonobstructive CAD.  2. Normal filling pressures.  3. Mild, primarily pulmonary venous hypertension.  4. Preserved cardiac output.     2022 Colonoscopy while inpatient due to GI bleed, blood in ileum, otherwise colon normal.  colonoscopy 2021 unremarkable with 10-year recall. No family history of colorectal malignancy.   2022 EGD in [place] for GI bleed The examined portions of the nasopharynx,                            oropharynx and larynx  were normal.                           - Normal esophagus.                           - 3 cm hiatal hernia.                           - A few gastric polyps.                           - Friable duodenal mucosa, otherwise normal                            examined   Capsule 2022 positive for blood in mid and distal small bowel, unable to see exact source.   December 2022 at [facility]: lower double balloon device-assisted enteroscopy, normal.  Past Medical History:  Diagnosis Date   AICD (automatic cardioverter/defibrillator) present    [employer]   Anemia    CAD in native artery    a. late recognition of presentation of STEMI 08/2015 s/p DES to LAD, mild residual mRCA.   Chronic systolic CHF (congestive heart failure) (HCC)    CKD (chronic kidney disease), stage III (  HCC)    Depression    Hypertension    Hypotension    a. preventing med titration for HF.   Hypothyroidism 09/24/2015   Ischemic cardiomyopathy    Myocardial infarction [facility]) 08/2015   PAF (paroxysmal atrial fibrillation) (HCC) 09/24/2015   a. dx at time of STEMI 08/2015.   Pre-diabetes    Presence of permanent cardiac pacemaker    patient has ICD   PVC's (premature ventricular contractions)     Past Surgical History:  Procedure Laterality Date   A-FLUTTER ABLATION N/A 08/18/2023   Procedure: A-FLUTTER ABLATION;  Surgeon: Cindie Ole DASEN, MD;  Location: [facility] INVASIVE CV LAB;  Service: Cardiovascular;  Laterality: N/A;   ATRIAL FIBRILLATION ABLATION N/A 08/18/2023   Procedure: ATRIAL FIBRILLATION ABLATION;  Surgeon: Cindie Ole DASEN, MD;  Location: [facility] INVASIVE CV LAB;  Service: Cardiovascular;  Laterality: N/A;   AV NODE ABLATION N/A 04/01/2023   Procedure: AV NODE ABLATION;  Surgeon: Waddell Danelle ORN, MD;  Location: [facility] INVASIVE CV LAB;  Service: Cardiovascular;  Laterality: N/A;   BIOPSY  06/16/2019   Procedure: BIOPSY;  Surgeon: Shaaron Lamar HERO, MD;  Location: [facility]O [facility];  Service: Endoscopy;;   BIV UPGRADE N/A 03/30/2023    Procedure: BIV VALERI;  Surgeon: Cindie Ole DASEN, MD;  Location: [facility] INVASIVE CV LAB;  Service: Cardiovascular;  Laterality: N/A;   CARDIAC CATHETERIZATION N/A 09/20/2015   Procedure: Left Heart Cath and Coronary Angiography;  Surgeon: Lonni JONETTA Cash, MD;  Location: [facility] INVASIVE CV LAB;  Service: Cardiovascular;  Laterality: N/A;   CARDIAC CATHETERIZATION N/A 09/20/2015   Procedure: Coronary Stent Intervention;  Surgeon: Lonni JONETTA Cash, MD;  Location: [facility] INVASIVE CV LAB;  Service: Cardiovascular;  Laterality: N/A;   CARDIAC CATHETERIZATION N/A 09/21/2015   Procedure: Left Heart Cath and Coronary Angiography;  Surgeon: Ozell Fell, MD;  Location: [facility] INVASIVE CV LAB;  Service: Cardiovascular;  Laterality: N/A;   CARDIAC CATHETERIZATION N/A 11/26/2015   Procedure: Right Heart Cath;  Surgeon: Ezra GORMAN Shuck, MD;  Location: [facility] INVASIVE CV LAB;  Service: Cardiovascular;  Laterality: N/A;   CARDIOVERSION N/A 03/27/2023   Procedure: CARDIOVERSION;  Surgeon: Shlomo Wilbert SAUNDERS, MD;  Location: [facility] INVASIVE CV LAB;  Service: Cardiovascular;  Laterality: N/A;   COLONOSCOPY WITH PROPOFOL  N/A 11/23/2017   Dr. Shaaron: Diverticulosis, internal hemorrhoids, next colonoscopy in 10 years   COLONOSCOPY WITH PROPOFOL  N/A 08/10/2019   Procedure: COLONOSCOPY WITH PROPOFOL ;  Surgeon: Shaaron Lamar HERO, MD;  Diverticulosis in sigmoid colon, nonbleeding internal hemorrhoids, otherwise normal exam.     COLONOSCOPY WITH PROPOFOL  N/A 12/02/2020   Procedure: COLONOSCOPY WITH PROPOFOL ;  Surgeon: Rollin Dover, MD;  Location: [facility] ENDOSCOPY;  Service: Endoscopy;  Laterality: N/A;   CORONARY STENT PLACEMENT  09/20/2015   EP IMPLANTABLE DEVICE N/A 01/23/2016   Procedure: ICD Implant;  Surgeon: Will Gladis Norton, MD;  Location: [facility] INVASIVE CV LAB;  Service: Cardiovascular;  Laterality: N/A;   ESOPHAGOGASTRODUODENOSCOPY (EGD) WITH PROPOFOL  N/A 06/16/2019   Procedure: ESOPHAGOGASTRODUODENOSCOPY (EGD) WITH PROPOFOL ;   Surgeon: Shaaron Lamar HERO, MD;  Normal esophagus, small hiatal hernia, normal examined stomach, normal examined duodenum.  Gastric biopsy with slight chronic inflammation, duodenal biopsy with slight intramucosal Brunner gland hyperplasia.   ESOPHAGOGASTRODUODENOSCOPY (EGD) WITH PROPOFOL  N/A 11/30/2020   Procedure: ESOPHAGOGASTRODUODENOSCOPY (EGD) WITH PROPOFOL ;  Surgeon: Stacia Glendia BRAVO, MD;  Location: [facility] ENDOSCOPY;  Service: Gastroenterology;  Laterality: N/A;   GIVENS CAPSULE STUDY N/A 11/23/2019    Surgeon: Shaaron Lamar HERO, MD; essentially unremarkable except for tiny  erosion in the proximal small bowel.   GIVENS CAPSULE STUDY  11/30/2020   Procedure: GIVENS CAPSULE STUDY;  Surgeon: Stacia Glendia BRAVO, MD;  Location: [facility] ENDOSCOPY;  Service: Gastroenterology;;   ICD GENERATOR CHANGEOUT N/A 04/14/2022   Procedure: ICD GENERATOR CHANGEOUT;  Surgeon: Cindie Ole DASEN, MD;  Location: [facility] INVASIVE CV LAB;  Service: Cardiovascular;  Laterality: N/A;   LEAD EXTRACTION N/A 04/14/2022   Procedure: LEAD EXTRACTION;  Surgeon: Cindie Ole DASEN, MD;  Location: [facility] INVASIVE CV LAB;  Service: Cardiovascular;  Laterality: N/A;   LEFT HEART CATH AND CORONARY ANGIOGRAPHY N/A 12/18/2017   Procedure: LEFT HEART CATH AND CORONARY ANGIOGRAPHY;  Surgeon: Rolan Ezra RAMAN, MD;  Location: [facility] INVASIVE CV LAB;  Service: Cardiovascular;  Laterality: N/A;   PVC ABLATION N/A 04/02/2017   Procedure: PVC ABLATION;  Surgeon: Inocencio Soyla Lunger, MD;  Location: [facility] INVASIVE CV LAB;  Service: Cardiovascular;  Laterality: N/A;   RIGHT/LEFT HEART CATH AND CORONARY ANGIOGRAPHY N/A 12/16/2023   Procedure: RIGHT/LEFT HEART CATH AND CORONARY ANGIOGRAPHY;  Surgeon: Rolan Ezra RAMAN, MD;  Location: [facility] INVASIVE CV LAB;  Service: Cardiovascular;  Laterality: N/A;   TEE WITHOUT CARDIOVERSION N/A 10/08/2016   Procedure: TRANSESOPHAGEAL ECHOCARDIOGRAM (TEE);  Surgeon: Rolan Ezra RAMAN, MD;  Location: [facility] ENDOSCOPY;  Service: Cardiovascular;   Laterality: N/A;   THYROIDECTOMY     TRANSESOPHAGEAL ECHOCARDIOGRAM (CATH LAB) N/A 03/27/2023   Procedure: TRANSESOPHAGEAL ECHOCARDIOGRAM;  Surgeon: Shlomo Wilbert SAUNDERS, MD;  Location: [facility] INVASIVE CV LAB;  Service: Cardiovascular;  Laterality: N/A;   TRANSESOPHAGEAL ECHOCARDIOGRAM (CATH LAB) N/A 08/11/2023   Procedure: TRANSESOPHAGEAL ECHOCARDIOGRAM;  Surgeon: Jeffrie Oneil BROCKS, MD;  Location: [facility] INVASIVE CV LAB;  Service: Cardiovascular;  Laterality: N/A;    Current Outpatient Medications  Medication Sig Dispense Refill   acetaminophen  (TYLENOL ) 500 MG tablet Take 1,000 mg by mouth every 6 (six) hours as needed for mild pain.     apixaban  (ELIQUIS ) 5 MG TABS tablet Take 1 tablet (5 mg total) by mouth 2 (two) times daily. Resume Sunday 2/9 60 tablet 6   buPROPion  (WELLBUTRIN  XL) 150 MG 24 hr tablet Take 150 mg by mouth daily.     Calcium  Carb-Cholecalciferol (CALCIUM  + D3 PO) Take 1,200 mg by mouth every morning. Slow release     Coenzyme Q10 (COQ-10) 200 MG CAPS Take 200 mg by mouth daily.     digoxin  (LANOXIN ) 0.125 MG tablet Take 1 tablet (0.125 mg total) by mouth daily. 90 tablet 3   dofetilide  (TIKOSYN ) 250 MCG capsule TAKE 1 CAPSULE BY MOUTH TWICE DAILY 180 capsule 3   esomeprazole  (NEXIUM ) 40 MG capsule TAKE 1 CAPSULE(40 MG) BY MOUTH TWICE DAILY BEFORE A MEAL 60 capsule 3   ferrous sulfate  325 (65 FE) MG EC tablet Take 325 mg by mouth daily.     fexofenadine (ALLEGRA) 180 MG tablet Take 180 mg by mouth daily.     levothyroxine  (SYNTHROID ) 125 MCG tablet Take 125 mcg by mouth daily before breakfast.     metoprolol  succinate (TOPROL -XL) 25 MG 24 hr tablet TAKE 1 TABLET BY MOUTH EVERY NIGHT AT BEDTIME 30 tablet 5   mometasone (NASONEX) 50 MCG/ACT nasal spray Place 2 sprays into the nose daily.     nitroGLYCERIN  (NITROSTAT ) 0.4 MG SL tablet DISSOLVE 1 TABLET UNDER THE TONGUE EVERY 5 MINUTES AS NEEDED FOR CHEST PAIN 25 tablet 0   potassium chloride  (KLOR-CON ) 20 MEQ packet Take 20 mEq by mouth  daily. 30 packet 11   rosuvastatin  (CRESTOR ) 20 MG  tablet TAKE 1 TABLET(20 MG) BY MOUTH DAILY 90 tablet 1   sertraline  (ZOLOFT ) 50 MG tablet Take 50 mg by mouth daily.     spironolactone  (ALDACTONE ) 25 MG tablet TAKE 1 TABLET(25 MG) BY MOUTH DAILY 30 tablet 11   torsemide  (DEMADEX ) 20 MG tablet Take 2 tablets (40 mg total) by mouth every morning AND 1 tablet (20 mg total) every evening. 90 tablet 3   traZODone  (DESYREL ) 50 MG tablet Take 25 mg by mouth at bedtime as needed for sleep.     No current facility-administered medications for this visit.    Allergies as of 01/28/2024 - Review Complete 01/28/2024  Allergen Reaction Noted   Paxil [paroxetine] Palpitations 11/23/2015   Amiodarone  Nausea And Vomiting 02/03/2023   Chlorhexidine  gluconate Itching 04/14/2022   Morphine  and codeine Nausea And Vomiting 11/23/2015   Percocet [oxycodone -acetaminophen ] Nausea And Vomiting 11/23/2015   Cefdinir Nausea Only 11/23/2015   Zolpidem  Other (See Comments) 11/23/2015    Family History  Problem Relation Age of Onset   Arrhythmia Mother    Lung cancer Mother        bronchiectasis   Heart failure Father    Heart attack Father    Heart attack Brother    Arrhythmia Brother    Colon cancer Neg Hx    Colon polyps Neg Hx     Social History   Socioeconomic History   Marital status: Married    Spouse name: Not on file   Number of children: Not on file   Years of education: Not on file   Highest education level: Not on file  Occupational History   Not on file  Tobacco Use   Smoking status: Never   Smokeless tobacco: Never   Tobacco comments:    Never smoked 01/21/23  Vaping Use   Vaping status: Never Used  Substance and Sexual Activity   Alcohol use: No   Drug use: No   Sexual activity: Not on file  Other Topics Concern   Not on file  Social History Narrative   Not on file   Social Drivers of Health   Financial Resource Strain: Low Risk  (02/10/2020)   Overall Financial  Resource Strain (CARDIA)    Difficulty of Paying Living Expenses: Not hard at all  Food Insecurity: No Food Insecurity (03/27/2023)   Hunger Vital Sign    Worried About Radiation Protection Practitioner of Food in the Last Year: Never true    Ran Out of Food in the Last Year: Never true  Transportation Needs: No Transportation Needs (03/27/2023)   PRAPARE - Administrator, Civil Service (Medical): No    Lack of Transportation (Non-Medical): No  Physical Activity: Insufficiently Active (02/10/2020)   Exercise Vital Sign    Days of Exercise per Week: 3 days    Minutes of Exercise per Session: 30 min  Stress: No Stress Concern Present (02/10/2020)   [employer] of Occupational Health - Occupational Stress Questionnaire    Feeling of Stress : Not at all  Social Connections: Socially Integrated (03/27/2023)   Social Connection and Isolation Panel    Frequency of Communication with Friends and Family: Three times a week    Frequency of Social Gatherings with Friends and Family: Three times a week    Attends Religious Services: More than 4 times per year    Active Member of Clubs or Organizations: Yes    Attends Banker Meetings: More than 4 times per year    Marital  Status: Married  Catering Manager Violence: Not At Risk (03/27/2023)   Humiliation, Afraid, Rape, and Kick questionnaire    Fear of Current or Ex-Partner: No    Emotionally Abused: No    Physically Abused: No    Sexually Abused: No     Review of Systems   Gen: Denies any fever, chills, fatigue, weight loss, lack of appetite.  CV: Denies chest pain, heart palpitations, peripheral edema, syncope.  Resp: Denies shortness of breath at rest or with exertion. Denies wheezing or cough.  GI: see HPI GU : Denies urinary burning, urinary frequency, urinary hesitancy MS: Denies joint pain, muscle weakness, cramps, or limitation of movement.  Derm: Denies rash, itching, dry skin Psych: Denies depression, anxiety, memory loss,  and confusion Heme: Denies bruising, bleeding, and enlarged lymph nodes.   Physical Exam   BP 97/66   Pulse 72   Temp 98.7 F (37.1 C) (Oral)   Ht 5' 9 (1.753 m)   Wt 135 lb 3.2 oz (61.3 kg)   SpO2 97%   BMI 19.97 kg/m  General:   Alert and oriented. Pleasant and cooperative. Well-nourished and well-developed.  Head:  Normocephalic and atraumatic. Eyes:  Without icterus Abdomen:  +BS, soft, non-tender and non-distended. No HSM noted. No guarding or rebound. No masses appreciated.  Rectal:  Deferred  Msk:  Symmetrical without gross deformities. Normal posture. Extremities:  Without edema. Neurologic:  Alert and  oriented x4;  grossly normal neurologically. Skin:  Intact without significant lesions or rashes. Psych:  Alert and cooperative. Normal mood and affect.   Assessment   Alejandra Hunt is a 58 y.o. female presenting today with a history of CAD, prior STEMI with PCI to LAD, ischemic cardiomyopathy, afib, mitral regurgitation, chronic systolic CHF, AICD, afib s/p ablation, recent cardiac cath Oct 2025, EF 20-25% chronically, mild pulmonary hypertension, on Eliquis  BID, chronic GERD, dysphagia, cardiac ablation June 2025 requiring uninterrupted anticoagulation through Sept 2025, last seen in Aug 2025 for dyspepsia and dysphagia but has had to postpone EGD/dilation due to anticoagulation.   She is now able to hold Eliquis  X 48 hours. Discussed with cardiology.  Suspect pill esophagitis (potassium) could have contributed to dysphagia. This is improved but not resolved. Dyspepsia resolved with Nexium  BID     PLAN    Will obtain cardiac clearance for procedure and holding Eliquis  for 48 hours: received and on file Proceed with upper endoscopy.dilation by Dr. Cindie in near future: the risks, benefits, and alternatives have been discussed with the patient in detail. The patient states understanding and desires to proceed.  Continue Nexium  BID   Therisa MICAEL Stager, PhD,  [facility] Gastroenterology

## 2024-01-28 NOTE — Progress Notes (Unsigned)
 Gastroenterology Office Note     Primary Care Physician:  Hyacinth Honey, NP  Primary Gastroenterologist:   Chief Complaint   Chief Complaint  Patient presents with   Follow-up    Still having issues with tightness in her throat     History of Present Illness   Catherine Mays is a 58 y.o. female presenting today with a history of     Needs procedure as needs to have 90 days of uninterrupted anticoagulation following ablation. Can hold for 2 days prior    Changed potassium from pill to packet with improvement.  Nexium  BID resolved abdominal pain. This has also helped slightly with dysphagia but not completed resolved.   Cold water  will cause constriction feeling of esophagus and feels pills very slowly trying to slide down esophagus. Like an engineer, structural   Ablation June 2025.   Oct 2025:  Ost LAD lesion is 40% stenosed.   Prox LAD to Mid LAD lesion is 10% stenosed.   1st Diag lesion is 60% stenosed.   1. Patent proximal LAD stent, nonobstructive CAD.  2. Normal filling pressures.  3. Mild, primarily pulmonary venous hypertension.  4. Preserved cardiac output.   Last colonoscopy 2021 unremarkable with 10-year recall. No family history of colorectal malignancy.    - The examined portions of the nasopharynx,                            oropharynx and larynx were normal.                           - Normal esophagus.                           - 3 cm hiatal hernia.                           - A few gastric polyps.                           - Friable duodenal mucosa, otherwise normal                            examined  Past Medical History:  Diagnosis Date   AICD (automatic cardioverter/defibrillator) present    [employer]. [employer]   Anemia    CAD in native artery    a. late recognition of presentation of STEMI 08/2015 s/p DES to LAD, mild residual mRCA.   Chronic systolic CHF (congestive heart failure) (HCC)    CKD (chronic kidney disease), stage III (HCC)    Depression     Hypertension    Hypotension    a. preventing med titration for HF.   Hypothyroidism 09/24/2015   Ischemic cardiomyopathy    Myocardial infarction [facility]) 08/2015   PAF (paroxysmal atrial fibrillation) (HCC) 09/24/2015   a. dx at time of STEMI 08/2015.   Pre-diabetes    Presence of permanent cardiac pacemaker    patient has ICD   PVC's (premature ventricular contractions)     Past Surgical History:  Procedure Laterality Date   A-FLUTTER ABLATION N/A 08/18/2023   Procedure: A-FLUTTER ABLATION;  Surgeon: Cindie Ole DASEN, MD;  Location: [facility] INVASIVE CV LAB;  Service: Cardiovascular;  Laterality: N/A;   ATRIAL FIBRILLATION ABLATION N/A 08/18/2023  Procedure: ATRIAL FIBRILLATION ABLATION;  Surgeon: Cindie Ole DASEN, MD;  Location: [facility] INVASIVE CV LAB;  Service: Cardiovascular;  Laterality: N/A;   AV NODE ABLATION N/A 04/01/2023   Procedure: AV NODE ABLATION;  Surgeon: Waddell Danelle ORN, MD;  Location: [facility] INVASIVE CV LAB;  Service: Cardiovascular;  Laterality: N/A;   BIOPSY  06/16/2019   Procedure: BIOPSY;  Surgeon: Shaaron Lamar HERO, MD;  Location: [facility] ENDO SU[facility];  Service: Endoscopy;;   BIV UPGRADE N/A 03/30/2023   Procedure: BIV VALERI;  Surgeon: Cindie Ole DASEN, MD;  Location: [facility] INVASIVE CV LAB;  Service: Cardiovascular;  Laterality: N/A;   CARDIAC CATHETERIZATION N/A 09/20/2015   Procedure: Left Heart Cath and Coronary Angiography;  Surgeon: Lonni JONETTA Cash, MD;  Location: [facility] INVASIVE CV LAB;  Service: Cardiovascular;  Laterality: N/A;   CARDIAC CATHETERIZATION N/A 09/20/2015   Procedure: Coronary Stent Intervention;  Surgeon: Lonni JONETTA Cash, MD;  Location: [facility] INVASIVE CV LAB;  Service: Cardiovascular;  Laterality: N/A;   CARDIAC CATHETERIZATION N/A 09/21/2015   Procedure: Left Heart Cath and Coronary Angiography;  Surgeon: Ozell Fell, MD;  Location: [facility] INVASIVE CV LAB;  Service: Cardiovascular;  Laterality: N/A;   CARDIAC CATHETERIZATION N/A 11/26/2015   Procedure: Right  Heart Cath;  Surgeon: Ezra GORMAN Shuck, MD;  Location: [facility] INVASIVE CV LAB;  Service: Cardiovascular;  Laterality: N/A;   CARDIOVERSION N/A 03/27/2023   Procedure: CARDIOVERSION;  Surgeon: Shlomo Wilbert SAUNDERS, MD;  Location: [facility] INVASIVE CV LAB;  Service: Cardiovascular;  Laterality: N/A;   COLONOSCOPY WITH PROPOFOL  N/A 11/23/2017   Dr. Shaaron: Diverticulosis, internal hemorrhoids, next colonoscopy in 10 years   COLONOSCOPY WITH PROPOFOL  N/A 08/10/2019   Procedure: COLONOSCOPY WITH PROPOFOL ;  Surgeon: Shaaron Lamar HERO, MD;  Diverticulosis in sigmoid colon, nonbleeding internal hemorrhoids, otherwise normal exam.     COLONOSCOPY WITH PROPOFOL  N/A 12/02/2020   Procedure: COLONOSCOPY WITH PROPOFOL ;  Surgeon: Rollin Dover, MD;  Location: [facility] ENDOSCOPY;  Service: Endoscopy;  Laterality: N/A;   CORONARY STENT PLACEMENT  09/20/2015   EP IMPLANTABLE DEVICE N/A 01/23/2016   Procedure: ICD Implant;  Surgeon: Will Gladis Norton, MD;  Location: [facility] INVASIVE CV LAB;  Service: Cardiovascular;  Laterality: N/A;   ESOPHAGOGASTRODUODENOSCOPY (EGD) WITH PROPOFOL  N/A 06/16/2019   Procedure: ESOPHAGOGASTRODUODENOSCOPY (EGD) WITH PROPOFOL ;  Surgeon: Shaaron Lamar HERO, MD;  Normal esophagus, small hiatal hernia, normal examined stomach, normal examined duodenum.  Gastric biopsy with slight chronic inflammation, duodenal biopsy with slight intramucosal Brunner gland hyperplasia.   ESOPHAGOGASTRODUODENOSCOPY (EGD) WITH PROPOFOL  N/A 11/30/2020   Procedure: ESOPHAGOGASTRODUODENOSCOPY (EGD) WITH PROPOFOL ;  Surgeon: Stacia Glendia BRAVO, MD;  Location: [facility] ENDOSCOPY;  Service: Gastroenterology;  Laterality: N/A;   GIVENS CAPSULE STUDY N/A 11/23/2019    Surgeon: Shaaron Lamar HERO, MD; essentially unremarkable except for tiny erosion in the proximal small bowel.   GIVENS CAPSULE STUDY  11/30/2020   Procedure: GIVENS CAPSULE STUDY;  Surgeon: Stacia Glendia BRAVO, MD;  Location: [facility] ENDOSCOPY;  Service: Gastroenterology;;   ICD GENERATOR CHANGEOUT N/A  04/14/2022   Procedure: ICD GENERATOR CHANGEOUT;  Surgeon: Cindie Ole DASEN, MD;  Location: [facility] INVASIVE CV LAB;  Service: Cardiovascular;  Laterality: N/A;   LEAD EXTRACTION N/A 04/14/2022   Procedure: LEAD EXTRACTION;  Surgeon: Cindie Ole DASEN, MD;  Location: [facility] INVASIVE CV LAB;  Service: Cardiovascular;  Laterality: N/A;   LEFT HEART CATH AND CORONARY ANGIOGRAPHY N/A 12/18/2017   Procedure: LEFT HEART CATH AND CORONARY ANGIOGRAPHY;  Surgeon: Shuck Ezra GORMAN, MD;  Location: [facility] INVASIVE CV LAB;  Service: Cardiovascular;  Laterality: N/A;   PVC ABLATION N/A 04/02/2017   Procedure: PVC ABLATION;  Surgeon: Inocencio Soyla Lunger, MD;  Location: [facility] INVASIVE CV LAB;  Service: Cardiovascular;  Laterality: N/A;   RIGHT/LEFT HEART CATH AND CORONARY ANGIOGRAPHY N/A 12/16/2023   Procedure: RIGHT/LEFT HEART CATH AND CORONARY ANGIOGRAPHY;  Surgeon: Rolan Ezra RAMAN, MD;  Location: [facility] INVASIVE CV LAB;  Service: Cardiovascular;  Laterality: N/A;   TEE WITHOUT CARDIOVERSION N/A 10/08/2016   Procedure: TRANSESOPHAGEAL ECHOCARDIOGRAM (TEE);  Surgeon: Rolan Ezra RAMAN, MD;  Location: [facility] ENDOSCOPY;  Service: Cardiovascular;  Laterality: N/A;   THYROIDECTOMY     TRANSESOPHAGEAL ECHOCARDIOGRAM (CATH LAB) N/A 03/27/2023   Procedure: TRANSESOPHAGEAL ECHOCARDIOGRAM;  Surgeon: Shlomo Wilbert SAUNDERS, MD;  Location: [facility] INVASIVE CV LAB;  Service: Cardiovascular;  Laterality: N/A;   TRANSESOPHAGEAL ECHOCARDIOGRAM (CATH LAB) N/A 08/11/2023   Procedure: TRANSESOPHAGEAL ECHOCARDIOGRAM;  Surgeon: Jeffrie Oneil BROCKS, MD;  Location: [facility] INVASIVE CV LAB;  Service: Cardiovascular;  Laterality: N/A;    Current Outpatient Medications  Medication Sig Dispense Refill   acetaminophen  (TYLENOL ) 500 MG tablet Take 1,000 mg by mouth every 6 (six) hours as needed for mild pain.     apixaban  (ELIQUIS ) 5 MG TABS tablet Take 1 tablet (5 mg total) by mouth 2 (two) times daily. Resume Sunday 2/9 60 tablet 6   buPROPion  (WELLBUTRIN  XL) 150 MG 24 hr tablet  Take 150 mg by mouth daily.     Calcium  Carb-Cholecalciferol (CALCIUM  + D3 PO) Take 1,200 mg by mouth every morning. Slow release     Coenzyme Q10 (COQ-10) 200 MG CAPS Take 200 mg by mouth daily.     digoxin  (LANOXIN ) 0.125 MG tablet Take 1 tablet (0.125 mg total) by mouth daily. 90 tablet 3   dofetilide  (TIKOSYN ) 250 MCG capsule TAKE 1 CAPSULE BY MOUTH TWICE DAILY 180 capsule 3   esomeprazole  (NEXIUM ) 40 MG capsule TAKE 1 CAPSULE(40 MG) BY MOUTH TWICE DAILY BEFORE A MEAL 60 capsule 3   ferrous sulfate  325 (65 FE) MG EC tablet Take 325 mg by mouth daily.     fexofenadine (ALLEGRA) 180 MG tablet Take 180 mg by mouth daily.     levothyroxine  (SYNTHROID ) 125 MCG tablet Take 125 mcg by mouth daily before breakfast.     metoprolol  succinate (TOPROL -XL) 25 MG 24 hr tablet TAKE 1 TABLET BY MOUTH EVERY NIGHT AT BEDTIME 30 tablet 5   mometasone (NASONEX) 50 MCG/ACT nasal spray Place 2 sprays into the nose daily.     nitroGLYCERIN  (NITROSTAT ) 0.4 MG SL tablet DISSOLVE 1 TABLET UNDER THE TONGUE EVERY 5 MINUTES AS NEEDED FOR CHEST PAIN 25 tablet 0   potassium chloride  (KLOR-CON ) 20 MEQ packet Take 20 mEq by mouth daily. 30 packet 11   rosuvastatin  (CRESTOR ) 20 MG tablet TAKE 1 TABLET(20 MG) BY MOUTH DAILY 90 tablet 1   sertraline  (ZOLOFT ) 50 MG tablet Take 50 mg by mouth daily.     spironolactone  (ALDACTONE ) 25 MG tablet TAKE 1 TABLET(25 MG) BY MOUTH DAILY 30 tablet 11   torsemide  (DEMADEX ) 20 MG tablet Take 2 tablets (40 mg total) by mouth every morning AND 1 tablet (20 mg total) every evening. 90 tablet 3   traZODone  (DESYREL ) 50 MG tablet Take 25 mg by mouth at bedtime as needed for sleep.     No current facility-administered medications for this visit.    Allergies as of 01/28/2024 - Review Complete 01/28/2024  Allergen Reaction Noted   Paxil [paroxetine] Palpitations 11/23/2015   Amiodarone  Nausea And Vomiting 02/03/2023  Chlorhexidine  gluconate Itching 04/14/2022   Morphine  and codeine Nausea  And Vomiting 11/23/2015   Percocet [oxycodone -acetaminophen ] Nausea And Vomiting 11/23/2015   Cefdinir Nausea Only 11/23/2015   Zolpidem  Other (See Comments) 11/23/2015    Family History  Problem Relation Age of Onset   Arrhythmia Mother    Lung cancer Mother        bronchiectasis   Heart failure Father    Heart attack Father    Heart attack Brother    Arrhythmia Brother    Colon cancer Neg Hx    Colon polyps Neg Hx     Social History   Socioeconomic History   Marital status: Married    Spouse name: Not on file   Number of children: Not on file   Years of education: Not on file   Highest education level: Not on file  Occupational History   Not on file  Tobacco Use   Smoking status: Never   Smokeless tobacco: Never   Tobacco comments:    Never smoked 01/21/23  Vaping Use   Vaping status: Never Used  Substance and Sexual Activity   Alcohol use: No   Drug use: No   Sexual activity: Not on file  Other Topics Concern   Not on file  Social History Narrative   Not on file   Social Drivers of Health   Financial Resource Strain: Low Risk  (02/10/2020)   Overall Financial Resource Strain (CARDIA)    Difficulty of Paying Living Expenses: Not hard at all  Food Insecurity: No Food Insecurity (03/27/2023)   Hunger Vital Sign    Worried About Running Out of Food in the Last Year: Never true    Ran Out of Food in the Last Year: Never true  Transportation Needs: No Transportation Needs (03/27/2023)   PRAPARE - Administrator, Civil Service (Medical): No    Lack of Transportation (Non-Medical): No  Physical Activity: Insufficiently Active (02/10/2020)   Exercise Vital Sign    Days of Exercise per Week: 3 days    Minutes of Exercise per Session: 30 min  Stress: No Stress Concern Present (02/10/2020)   [employer] of Occupational Health - Occupational Stress Questionnaire    Feeling of Stress : Not at all  Social Connections: Socially Integrated (03/27/2023)    Social Connection and Isolation Panel    Frequency of Communication with Friends and Family: Three times a week    Frequency of Social Gatherings with Friends and Family: Three times a week    Attends Religious Services: More than 4 times per year    Active Member of Clubs or Organizations: Yes    Attends Banker Meetings: More than 4 times per year    Marital Status: Married  Catering Manager Violence: Not At Risk (03/27/2023)   Humiliation, Afraid, Rape, and Kick questionnaire    Fear of Current or Ex-Partner: No    Emotionally Abused: No    Physically Abused: No    Sexually Abused: No     Review of Systems   Gen: Denies any fever, chills, fatigue, weight loss, lack of appetite.  CV: Denies chest pain, heart palpitations, peripheral edema, syncope.  Resp: Denies shortness of breath at rest or with exertion. Denies wheezing or cough.  GI: Denies dysphagia or odynophagia. Denies jaundice, hematemesis, fecal incontinence. GU : Denies urinary burning, urinary frequency, urinary hesitancy MS: Denies joint pain, muscle weakness, cramps, or limitation of movement.  Derm: Denies rash, itching, dry skin Psych:  Denies depression, anxiety, memory loss, and confusion Heme: Denies bruising, bleeding, and enlarged lymph nodes.   Physical Exam   BP 97/66   Pulse 72   Temp 98.7 F (37.1 C) (Oral)   Ht 5' 9 (1.753 m)   Wt 135 lb 3.2 oz (61.3 kg)   SpO2 97%   BMI 19.97 kg/m  General:   Alert and oriented. Pleasant and cooperative. Well-nourished and well-developed.  Head:  Normocephalic and atraumatic. Eyes:  Without icterus Abdomen:  +BS, soft, non-tender and non-distended. No HSM noted. No guarding or rebound. No masses appreciated.  Rectal:  Deferred  Msk:  Symmetrical without gross deformities. Normal posture. Extremities:  Without edema. Neurologic:  Alert and  oriented x4;  grossly normal neurologically. Skin:  Intact without significant lesions or  rashes. Psych:  Alert and cooperative. Normal mood and affect.   Assessment   Catherine Mays is a 58 y.o. female presenting today with a history of    Suspect pill esophagitis,  improving but not resolved    PLAN    Will obtain cardiac clearance for procedure and holding Eliquis  for 48 hours Proceed with upper endoscopy.dilation by Dr. Cindie in near future: the risks, benefits, and alternatives have been discussed with the patient in detail. The patient states understanding and desires to proceed.  Continue Nexium  BID   Catherine MICAEL Stager, PhD, [facility] Gastroenterology

## 2024-01-28 NOTE — Patient Instructions (Signed)
 Continue Nexium  twice a day, 30 minutes before eating.   We will get cardiac clearance for the endoscopy/dilation and to also hold Eliquis  for 2 days prior. Once we have clearance, we will arrange the procedure!  We will see you in 6 months regardless!  I enjoyed seeing you again today! I value our relationship and want to provide genuine, compassionate, and quality care. You may receive a survey regarding your visit with me, and I welcome your feedback! Thanks so much for taking the time to complete this. I look forward to seeing you again.      Therisa MICAEL Stager, PhD, ANP-BC [facility] Gastroenterology

## 2024-01-28 NOTE — Telephone Encounter (Signed)
 Dr. Rolan, patient's chart was reviewed for preoperative cardiac evaluation. She was seen by you on 12/28/23 and according to protocol, we request that you comment on cardiac risk for upcoming procedure since office visit was less than 2 months ago.    Please route your response to p cv div preop.  Thank you, Rosaline EMERSON Bane, NP-C 01/28/2024, 11:28 AM

## 2024-01-28 NOTE — Telephone Encounter (Signed)
  Request for patient to stop medication prior to procedure or is needing cleareance  01/28/24  Catherine Mays 1965-06-23  What type of surgery is being performed? Esophagogastroduodenoscopy (EGD) with esophageal dilation  When is surgery scheduled? TBD  What type of clearance is required (medical or pharmacy to hold medication or both? Both  Patient is needing to have cardiac clearance and clearnace to hold Eliquis  x 2 days prior to procedure.  Name of physician performing surgery?  Dr. Cindie Rouse Gastroenterology at [facility] Phone: [phone], option 5 Fax: [phone]  Anesthesia type (none, local, MAC, general)? MAC   ? Yes ? No Patient can hold medication as requested   Signature: ___________________________

## 2024-01-28 NOTE — Telephone Encounter (Signed)
   Primary Cardiologist: Lonni Cash, MD  Chart reviewed as part of pre-operative protocol coverage. Given past medical history and time since last visit, based on ACC/AHA guidelines, Catherine Mays would be at acceptable risk for the planned procedure without further cardiovascular testing.   Patient should contact our office if he is having new symptoms that are concerning from a cardiac perspective to arrange a follow-up appointment.    Per office protocol, she may hold Eliquis  for 2 days prior to procedure and should resume as soon as hemodynamically stable postoperatively.  I will route this recommendation to the requesting party via Epic fax function and remove from pre-op  pool.  Please call with questions.  Rosaline EMERSON Bane, NP-C 01/28/2024, 1:41 PM [address], [address] [place], [place] [address] Office [phone] Fax [phone]

## 2024-01-29 ENCOUNTER — Ambulatory Visit (HOSPITAL_COMMUNITY): Payer: Self-pay | Admitting: Cardiology

## 2024-01-29 LAB — BASIC METABOLIC PANEL WITH GFR
BUN/Creatinine Ratio: 19 (ref 9–23)
BUN: 24 mg/dL (ref 6–24)
CO2: 28 mmol/L (ref 20–29)
Calcium: 8.7 mg/dL (ref 8.7–10.2)
Chloride: 96 mmol/L (ref 96–106)
Creatinine, Ser: 1.24 mg/dL — ABNORMAL HIGH (ref 0.57–1.00)
Glucose: 107 mg/dL — ABNORMAL HIGH (ref 70–99)
Potassium: 4.2 mmol/L (ref 3.5–5.2)
Sodium: 138 mmol/L (ref 134–144)
eGFR: 50 mL/min/1.73 — ABNORMAL LOW (ref >59)

## 2024-01-31 ENCOUNTER — Other Ambulatory Visit: Payer: Self-pay | Admitting: Cardiovascular Disease

## 2024-02-01 ENCOUNTER — Ambulatory Visit: Payer: Self-pay | Admitting: Cardiology

## 2024-02-02 NOTE — Telephone Encounter (Signed)
 Noted. May arrange procedure and hold Eliquis  as planned.

## 2024-02-02 NOTE — Progress Notes (Signed)
 Remote ICD Transmission

## 2024-02-02 NOTE — Telephone Encounter (Signed)
 LMOVM to return call.

## 2024-02-03 ENCOUNTER — Encounter: Payer: Self-pay | Admitting: *Deleted

## 2024-02-03 NOTE — Telephone Encounter (Signed)
 Pt has been scheduled for 02/09/24. Instructions sent via mychart.

## 2024-02-03 NOTE — Telephone Encounter (Signed)
 Availity PA for EGD/ED:   Procedure Code 1: Review/Authorization Not Required   Procedure Code 2: Review/Authorization Not Required

## 2024-02-05 ENCOUNTER — Encounter (HOSPITAL_COMMUNITY)
Admission: RE | Admit: 2024-02-05 | Discharge: 2024-02-05 | Disposition: A | Source: Ambulatory Visit | Attending: Internal Medicine

## 2024-02-05 NOTE — Pre-Procedure Instructions (Signed)
 Attempted pre-op phonecall. Left VM for her to call us  back.

## 2024-02-08 NOTE — Pre-Procedure Instructions (Signed)
 Attempted pre-op phonecall. Left VM for her to call us  back.

## 2024-02-09 ENCOUNTER — Ambulatory Visit (HOSPITAL_COMMUNITY): Admitting: Certified Registered Nurse Anesthetist

## 2024-02-09 ENCOUNTER — Encounter (HOSPITAL_COMMUNITY): Admission: RE | Disposition: A | Payer: Self-pay | Source: Home / Self Care | Attending: Internal Medicine

## 2024-02-09 ENCOUNTER — Encounter (HOSPITAL_COMMUNITY): Payer: Self-pay | Admitting: Internal Medicine

## 2024-02-09 ENCOUNTER — Ambulatory Visit (HOSPITAL_COMMUNITY)
Admission: RE | Admit: 2024-02-09 | Discharge: 2024-02-09 | Disposition: A | Attending: Internal Medicine | Admitting: Internal Medicine

## 2024-02-09 ENCOUNTER — Other Ambulatory Visit: Payer: Self-pay

## 2024-02-09 DIAGNOSIS — Z95810 Presence of automatic (implantable) cardiac defibrillator: Secondary | ICD-10-CM | POA: Insufficient documentation

## 2024-02-09 DIAGNOSIS — N183 Chronic kidney disease, stage 3 unspecified: Secondary | ICD-10-CM | POA: Diagnosis not present

## 2024-02-09 DIAGNOSIS — I252 Old myocardial infarction: Secondary | ICD-10-CM | POA: Insufficient documentation

## 2024-02-09 DIAGNOSIS — I255 Ischemic cardiomyopathy: Secondary | ICD-10-CM | POA: Insufficient documentation

## 2024-02-09 DIAGNOSIS — K317 Polyp of stomach and duodenum: Secondary | ICD-10-CM

## 2024-02-09 DIAGNOSIS — K219 Gastro-esophageal reflux disease without esophagitis: Secondary | ICD-10-CM | POA: Insufficient documentation

## 2024-02-09 DIAGNOSIS — K5730 Diverticulosis of large intestine without perforation or abscess without bleeding: Secondary | ICD-10-CM | POA: Diagnosis not present

## 2024-02-09 DIAGNOSIS — K3189 Other diseases of stomach and duodenum: Secondary | ICD-10-CM | POA: Insufficient documentation

## 2024-02-09 DIAGNOSIS — E039 Hypothyroidism, unspecified: Secondary | ICD-10-CM | POA: Insufficient documentation

## 2024-02-09 DIAGNOSIS — F32A Depression, unspecified: Secondary | ICD-10-CM | POA: Insufficient documentation

## 2024-02-09 DIAGNOSIS — I340 Nonrheumatic mitral (valve) insufficiency: Secondary | ICD-10-CM | POA: Diagnosis not present

## 2024-02-09 DIAGNOSIS — I2510 Atherosclerotic heart disease of native coronary artery without angina pectoris: Secondary | ICD-10-CM | POA: Insufficient documentation

## 2024-02-09 DIAGNOSIS — Z7901 Long term (current) use of anticoagulants: Secondary | ICD-10-CM | POA: Diagnosis not present

## 2024-02-09 DIAGNOSIS — K2970 Gastritis, unspecified, without bleeding: Secondary | ICD-10-CM | POA: Diagnosis not present

## 2024-02-09 DIAGNOSIS — K319 Disease of stomach and duodenum, unspecified: Secondary | ICD-10-CM | POA: Diagnosis not present

## 2024-02-09 DIAGNOSIS — K222 Esophageal obstruction: Secondary | ICD-10-CM | POA: Insufficient documentation

## 2024-02-09 DIAGNOSIS — K449 Diaphragmatic hernia without obstruction or gangrene: Secondary | ICD-10-CM | POA: Insufficient documentation

## 2024-02-09 DIAGNOSIS — I5022 Chronic systolic (congestive) heart failure: Secondary | ICD-10-CM | POA: Insufficient documentation

## 2024-02-09 DIAGNOSIS — R1310 Dysphagia, unspecified: Secondary | ICD-10-CM

## 2024-02-09 DIAGNOSIS — I130 Hypertensive heart and chronic kidney disease with heart failure and stage 1 through stage 4 chronic kidney disease, or unspecified chronic kidney disease: Secondary | ICD-10-CM | POA: Diagnosis not present

## 2024-02-09 DIAGNOSIS — I272 Pulmonary hypertension, unspecified: Secondary | ICD-10-CM | POA: Diagnosis not present

## 2024-02-09 HISTORY — PX: ESOPHAGOGASTRODUODENOSCOPY: SHX5428

## 2024-02-09 HISTORY — PX: ESOPHAGEAL DILATION: SHX303

## 2024-02-09 SURGERY — EGD (ESOPHAGOGASTRODUODENOSCOPY)
Anesthesia: Monitor Anesthesia Care

## 2024-02-09 MED ORDER — EPHEDRINE SULFATE (PRESSORS) 25 MG/5ML IV SOSY
PREFILLED_SYRINGE | INTRAVENOUS | Status: DC | PRN
  Administered 2024-02-09: 08:00:00 5 mg via INTRAVENOUS

## 2024-02-09 MED ORDER — GLYCOPYRROLATE PF 0.2 MG/ML IJ SOSY
PREFILLED_SYRINGE | INTRAMUSCULAR | Status: AC
  Filled 2024-02-09: qty 1

## 2024-02-09 MED ORDER — EPHEDRINE 5 MG/ML INJ
INTRAVENOUS | Status: AC
  Filled 2024-02-09: qty 5

## 2024-02-09 MED ORDER — PROPOFOL 10 MG/ML IV BOLUS
INTRAVENOUS | Status: AC
  Filled 2024-02-09: qty 20

## 2024-02-09 MED ORDER — LACTATED RINGERS IV SOLN: INTRAVENOUS | Status: DC | PRN

## 2024-02-09 MED ORDER — GLYCOPYRROLATE 0.2 MG/ML IJ SOLN
INTRAMUSCULAR | Status: DC | PRN
  Administered 2024-02-09: 08:00:00 .1 mg via INTRAVENOUS

## 2024-02-09 MED ORDER — PROPOFOL 10 MG/ML IV BOLUS
INTRAVENOUS | Status: DC | PRN
  Administered 2024-02-09: 08:00:00 25 mg via INTRAVENOUS
  Administered 2024-02-09: 08:00:00 75 mg via INTRAVENOUS

## 2024-02-09 NOTE — Transfer of Care (Signed)
 Immediate Anesthesia Transfer of Care Note  Patient: Catherine Mays  Procedure(s) Performed: EGD (ESOPHAGOGASTRODUODENOSCOPY) DILATION, ESOPHAGUS  Patient Location: PACU  Anesthesia Type:MAC  Level of Consciousness: awake and drowsy  Airway & Oxygen Therapy: Patient Spontanous Breathing  Post-op Assessment: Report given to RN and Patient moving all extremities X 4  Post vital signs: Reviewed and stable  Last Vitals:  Vitals Value Taken Time  BP 112/69 02/09/24 07:53  Temp 36.6 C 02/09/24 07:53  Pulse 71 02/09/24 07:53  Resp 21 02/09/24 07:53  SpO2 100 % 02/09/24 07:53    Last Pain:  Vitals:   02/09/24 0753  TempSrc: Oral  PainSc: 0-No pain         Complications: No notable events documented.

## 2024-02-09 NOTE — Discharge Instructions (Addendum)
 EGD Discharge instructions Please read the instructions outlined below and refer to this sheet in the next few weeks. These discharge instructions provide you with general information on caring for yourself after you leave the hospital. Your doctor may also give you specific instructions. While your treatment has been planned according to the most current medical practices available, unavoidable complications occasionally occur. If you have any problems or questions after discharge, please call your doctor. ACTIVITY You may resume your regular activity but move at a slower pace for the next 24 hours.  Take frequent rest periods for the next 24 hours.  Walking will help expel (get rid of) the air and reduce the bloated feeling in your abdomen.  No driving for 24 hours (because of the anesthesia (medicine) used during the test).  You may shower.  Do not sign any important legal documents or operate any machinery for 24 hours (because of the anesthesia used during the test).  NUTRITION Drink plenty of fluids.  You may resume your normal diet.  Begin with a light meal and progress to your normal diet.  Avoid alcoholic beverages for 24 hours or as instructed by your caregiver.  MEDICATIONS You may resume your normal medications unless your caregiver tells you otherwise.  WHAT YOU CAN EXPECT TODAY You may experience abdominal discomfort such as a feeling of fullness or gas pains.  FOLLOW-UP Your doctor will discuss the results of your test with you.  SEEK IMMEDIATE MEDICAL ATTENTION IF ANY OF THE FOLLOWING OCCUR: Excessive nausea (feeling sick to your stomach) and/or vomiting.  Severe abdominal pain and distention (swelling).  Trouble swallowing.  Temperature over 101 F (37.8 C).  Rectal bleeding or vomiting of blood.   Your EGD revealed mild amount inflammation in your stomach.  I took biopsies of this to rule out infection with a bacteria called H. pylori.  Await pathology results, my  office will contact you.  You also had a small hiatal hernia. You also had a mild tightening of your esophagus,  I stretched this out today.  Hopefully this helps with feeling of food getting stuck.  Small bowel was normal.  Continue on esomeprazole  twice daily.   Follow up in GI office in 3 months  I hope you have a great rest of your week!  Catherine Mays. Cindie, D.O. Gastroenterology and Hepatology [facility]

## 2024-02-09 NOTE — Op Note (Signed)
 [facility] Patient Name: Catherine Mays Procedure Date: 02/09/2024 7:32 AM MRN: [id number] Date of Birth: 1966/01/27 Attending MD: Carlin POUR. Cindie , [place], [id number] CSN: [id number] Age: 57 Admit Type: Outpatient Procedure:                Upper GI endoscopy Indications:              Dysphagia, Heartburn Providers:                Carlin POUR. Cindie, DO, Devere Lodge, Bascom Blush Referring MD:             Carlin POUR. Cindie, DO Medicines:                See the Anesthesia note for documentation of the                            administered medications Complications:            No immediate complications. Estimated Blood Loss:     Estimated blood loss was minimal. Procedure:                Pre-Anesthesia Assessment:                           - The anesthesia plan was to use monitored                            anesthesia care (MAC).                           After obtaining informed consent, the endoscope was                            passed under direct vision. Throughout the                            procedure, the patient's blood pressure, pulse, and                            oxygen saturations were monitored continuously. The                            [id number]-[id number] ([id number]) Upper was introduced through                            the mouth, and advanced to the second part of                            duodenum. The upper GI endoscopy was accomplished                            without difficulty. The patient tolerated the                            procedure well. Scope In: 7:42:20 AM Scope Out: 7:46:25 AM Total Procedure Duration: 0 hours 4 minutes 5 seconds  Findings:      A small hiatal hernia was present.  No endoscopic abnormality was evident in the esophagus to explain the       patient's complaint of dysphagia (besides small hiatal hernia).       Preparations were made for empiric dilation. A TTS dilator was passed       through the scope. Dilation with an  18-19-20 mm balloon dilator was       performed to 20 mm. Dilation was performed with a mild resistance at 20       mm. Estimated blood loss was none.      Mild inflammation characterized by striped appearance leading to pylorus       was found in the gastric antrum. Biopsies were taken with a cold forceps       for Helicobacter pylori testing.      Multiple small 3 to 8 mm fudnic gland polyps with no bleeding and no       stigmata of recent bleeding were found in the gastric fundus and in the       gastric body.      The duodenal bulb, first portion of the duodenum and second portion of       the duodenum were normal. Impression:               - Small hiatal hernia.                           - Gastritis. Biopsied.                           - Multiple gastric polyps.                           - Normal duodenal bulb, first portion of the                            duodenum and second portion of the duodenum. Moderate Sedation:      Per Anesthesia Care Recommendation:           - Patient has a contact number available for                            emergencies. The signs and symptoms of potential                            delayed complications were discussed with the                            patient. Return to normal activities tomorrow.                            Written discharge instructions were provided to the                            patient.                           - Resume previous diet.                           -  Continue present medications.                           - Await pathology results.                           - Repeat upper endoscopy PRN for retreatment.                           - Return to GI clinic in 3 months.                           - Use a proton pump inhibitor PO BID. Procedure Code(s):        --- Professional ---                           [phone], Esophagogastroduodenoscopy, flexible,                            transoral; with biopsy, single or  multiple Diagnosis Code(s):        --- Professional ---                           K44.9, Diaphragmatic hernia without obstruction or                            gangrene                           K29.70, Gastritis, unspecified, without bleeding                           K31.7, Polyp of stomach and duodenum                           R13.10, Dysphagia, unspecified                           R12, Heartburn CPT copyright 2022 American Medical Association. All rights reserved. The codes documented in this report are preliminary and upon coder review may  be revised to meet current compliance requirements. Carlin POUR. Cindie, DO Carlin POUR. Cindie, DO 02/09/2024 7:50:34 AM This report has been signed electronically. Number of Addenda: 0

## 2024-02-09 NOTE — Anesthesia Preprocedure Evaluation (Signed)
 Anesthesia Evaluation  Patient identified by MRN, date of birth, ID band Patient awake    Reviewed: Allergy & Precautions, H&P , NPO status , Patient's Chart, lab work & pertinent test results, reviewed documented beta blocker date and time   Airway Mallampati: II  TM Distance: >3 FB Neck ROM: full    Dental no notable dental hx.    Pulmonary neg pulmonary ROS   Pulmonary exam normal breath sounds clear to auscultation       Cardiovascular Exercise Tolerance: Good hypertension, + CAD, + Past MI and +CHF  + pacemaker + Cardiac Defibrillator  Rhythm:regular Rate:Normal     Neuro/Psych  PSYCHIATRIC DISORDERS  Depression    negative neurological ROS     GI/Hepatic Neg liver ROS,GERD  ,,  Endo/Other  Hypothyroidism    Renal/GU CRFRenal disease  negative genitourinary   Musculoskeletal   Abdominal   Peds  Hematology  (+) Blood dyscrasia, anemia   Anesthesia Other Findings  1. Left ventricular ejection fraction, by estimation, is 20 to 25%. The  left ventricle has severely decreased function. The left ventricle  demonstrates global hypokinesis. The left ventricular internal cavity size  was severely dilated. Left ventricular  diastolic function could not be evaluated.   2. Right ventricular systolic function is normal. The right ventricular  size is normal. There is moderately elevated pulmonary artery systolic  pressure. The estimated right ventricular systolic pressure is 59.1 mmHg.   3. Left atrial size was severely dilated.   4. Right atrial size was severely dilated.   5. The mitral valve is degenerative. Moderate mitral valve regurgitation.  No evidence of mitral stenosis.   6. Tricuspid valve regurgitation is severe.   7. The aortic valve is normal in structure. Aortic valve regurgitation is  not visualized. Aortic valve sclerosis is present, with no evidence of  aortic valve stenosis.     Reproductive/Obstetrics negative OB ROS                              Anesthesia Physical Anesthesia Plan  ASA: 4  Anesthesia Plan: MAC   Post-op Pain Management:    Induction:   PONV Risk Score and Plan: Propofol  infusion  Airway Management Planned:   Additional Equipment:   Intra-op Plan:   Post-operative Plan:   Informed Consent: I have reviewed the patients History and Physical, chart, labs and discussed the procedure including the risks, benefits and alternatives for the proposed anesthesia with the patient or authorized representative who has indicated his/her understanding and acceptance.     Dental Advisory Given  Plan Discussed with: CRNA  Anesthesia Plan Comments:          Anesthesia Quick Evaluation

## 2024-02-09 NOTE — Interval H&P Note (Signed)
 History and Physical Interval Note:  02/09/2024 7:26 AM  Catherine Mays  has presented today for surgery, with the diagnosis of dysphagia.  The various methods of treatment have been discussed with the patient and family. After consideration of risks, benefits and other options for treatment, the patient has consented to  Procedures with comments: EGD (ESOPHAGOGASTRODUODENOSCOPY) (N/A) - 7:30 am, asa 3 DILATION, ESOPHAGUS (N/A) as a surgical intervention.  The patient's history has been reviewed, patient examined, no change in status, stable for surgery.  I have reviewed the patient's chart and labs.  Questions were answered to the patient's satisfaction.     [name]

## 2024-02-10 LAB — SURGICAL PATHOLOGY

## 2024-02-10 NOTE — Anesthesia Postprocedure Evaluation (Signed)
 Anesthesia Post Note  Patient: Catherine Mays  Procedure(s) Performed: EGD (ESOPHAGOGASTRODUODENOSCOPY) DILATION, ESOPHAGUS  Anesthesia Type: MAC Anesthetic complications: no   No notable events documented.   Last Vitals:  Vitals:   02/09/24 0651 02/09/24 0753  BP: 102/72 112/69  Pulse: 70 71  Resp: 15 (!) 21  Temp: 36.9 C 36.6 C  SpO2: 98% 100%    Last Pain:  Vitals:   02/09/24 0753  TempSrc: Oral  PainSc: 0-No pain                 [name]

## 2024-02-11 ENCOUNTER — Telehealth: Payer: Self-pay | Admitting: Cardiology

## 2024-02-11 NOTE — Telephone Encounter (Signed)
°*  STAT* If patient is at the pharmacy, call can be transferred to refill team.   1. Which medications need to be refilled? (please list name of each medication and dose if known) dofetilide  (TIKOSYN ) 250 MCG capsule    2. Would you like to learn more about the convenience, safety, & potential cost savings by using the [facility]? No    3. Are you open to using the [facility] (Type [facility]. No    4. Which pharmacy/location (including street and city if local pharmacy) is medication to be sent to?[facility] - [place], [place] - [address]    5. Do they need a 30 day or 90 day supply? 90 day   Pt is out of medication.

## 2024-02-12 NOTE — Telephone Encounter (Signed)
 Left pt a detailed message that we have sent a year supply to [facility] and if she is needing to switch pharmacies, to have [employer] and have the prescription transferred over.  If she needed further assistance, to call us  back.

## 2024-02-12 NOTE — Telephone Encounter (Signed)
" °*  STAT* If patient is at the pharmacy, call can be transferred to refill team.   1. Which medications need to be refilled? (please list name of each medication and dose if known)   dofetilide  (TIKOSYN ) 250 MCG capsule   2. Would you like to learn more about the convenience, safety, & potential cost savings by using the [facility]?   3. Are you open to using the [facility] (Type [facility]. ).  4. Which pharmacy/location (including street and city if local pharmacy) is medication to be sent to?  [facility] - [place], [place] - [address]   5. Do they need a 30 day or 90 day supply?   Patient called to report [facility] in [place] is closed and she is unable to transfer her prescription to [facility].  Patient wants prescription for this medication sent to [facility] as she is now completely out of medication. "

## 2024-02-12 NOTE — Telephone Encounter (Signed)
 Spoke with pt, she has been taken care of and really appreciated my call.SABRASABRA

## 2024-03-02 ENCOUNTER — Telehealth (HOSPITAL_COMMUNITY): Payer: Self-pay | Admitting: Cardiology

## 2024-03-02 MED ORDER — TORSEMIDE 20 MG PO TABS: ORAL_TABLET | ORAL | 3 refills | Status: AC

## 2024-03-02 MED ORDER — TORSEMIDE 20 MG PO TABS: ORAL_TABLET | ORAL | 3 refills | Status: DC

## 2024-03-02 NOTE — Telephone Encounter (Signed)
 [facility] called to request refill on torsemide   Meds sent

## 2024-03-18 LAB — LAB REPORT - SCANNED
A1c: 6.2
Albumin, Urine POC: <3
Creatinine, POC: 13.7 mg/dL
EGFR: 46
Microalb Creat Ratio: <22

## 2024-03-25 ENCOUNTER — Other Ambulatory Visit: Payer: Self-pay

## 2024-03-25 DIAGNOSIS — I509 Heart failure, unspecified: Secondary | ICD-10-CM

## 2024-03-28 ENCOUNTER — Telehealth (HOSPITAL_COMMUNITY): Payer: Self-pay

## 2024-03-29 ENCOUNTER — Ambulatory Visit (HOSPITAL_COMMUNITY): Payer: Self-pay | Admitting: Family Medicine

## 2024-03-29 ENCOUNTER — Ambulatory Visit (HOSPITAL_COMMUNITY)
Admission: RE | Admit: 2024-03-29 | Discharge: 2024-03-29 | Disposition: A | Source: Ambulatory Visit | Attending: Family Medicine

## 2024-03-29 ENCOUNTER — Encounter (HOSPITAL_COMMUNITY): Payer: Self-pay

## 2024-03-29 VITALS — BP 102/64 | HR 72 | Wt 136.2 lb

## 2024-03-29 DIAGNOSIS — E785 Hyperlipidemia, unspecified: Secondary | ICD-10-CM | POA: Diagnosis not present

## 2024-03-29 DIAGNOSIS — I252 Old myocardial infarction: Secondary | ICD-10-CM | POA: Insufficient documentation

## 2024-03-29 DIAGNOSIS — I340 Nonrheumatic mitral (valve) insufficiency: Secondary | ICD-10-CM

## 2024-03-29 DIAGNOSIS — I081 Rheumatic disorders of both mitral and tricuspid valves: Secondary | ICD-10-CM | POA: Insufficient documentation

## 2024-03-29 DIAGNOSIS — I8290 Acute embolism and thrombosis of unspecified vein: Secondary | ICD-10-CM | POA: Diagnosis not present

## 2024-03-29 DIAGNOSIS — I480 Paroxysmal atrial fibrillation: Secondary | ICD-10-CM | POA: Insufficient documentation

## 2024-03-29 DIAGNOSIS — I2510 Atherosclerotic heart disease of native coronary artery without angina pectoris: Secondary | ICD-10-CM | POA: Insufficient documentation

## 2024-03-29 DIAGNOSIS — Z8719 Personal history of other diseases of the digestive system: Secondary | ICD-10-CM | POA: Diagnosis not present

## 2024-03-29 DIAGNOSIS — Z7901 Long term (current) use of anticoagulants: Secondary | ICD-10-CM | POA: Insufficient documentation

## 2024-03-29 DIAGNOSIS — I5022 Chronic systolic (congestive) heart failure: Secondary | ICD-10-CM | POA: Diagnosis not present

## 2024-03-29 DIAGNOSIS — R5383 Other fatigue: Secondary | ICD-10-CM | POA: Insufficient documentation

## 2024-03-29 DIAGNOSIS — N183 Chronic kidney disease, stage 3 unspecified: Secondary | ICD-10-CM | POA: Insufficient documentation

## 2024-03-29 DIAGNOSIS — I447 Left bundle-branch block, unspecified: Secondary | ICD-10-CM | POA: Insufficient documentation

## 2024-03-29 DIAGNOSIS — Z79899 Other long term (current) drug therapy: Secondary | ICD-10-CM | POA: Insufficient documentation

## 2024-03-29 DIAGNOSIS — Z95810 Presence of automatic (implantable) cardiac defibrillator: Secondary | ICD-10-CM | POA: Insufficient documentation

## 2024-03-29 DIAGNOSIS — I493 Ventricular premature depolarization: Secondary | ICD-10-CM | POA: Diagnosis not present

## 2024-03-29 DIAGNOSIS — I255 Ischemic cardiomyopathy: Secondary | ICD-10-CM | POA: Insufficient documentation

## 2024-03-29 DIAGNOSIS — Z955 Presence of coronary angioplasty implant and graft: Secondary | ICD-10-CM | POA: Insufficient documentation

## 2024-03-29 DIAGNOSIS — I4819 Other persistent atrial fibrillation: Secondary | ICD-10-CM | POA: Diagnosis not present

## 2024-03-29 DIAGNOSIS — R42 Dizziness and giddiness: Secondary | ICD-10-CM | POA: Insufficient documentation

## 2024-03-29 LAB — BASIC METABOLIC PANEL WITH GFR
Anion gap: 8 (ref 5–15)
BUN: 26 mg/dL — ABNORMAL HIGH (ref 6–20)
CO2: 33 mmol/L — ABNORMAL HIGH (ref 22–32)
Calcium: 9.1 mg/dL (ref 8.9–10.3)
Chloride: 98 mmol/L (ref 98–111)
Creatinine, Ser: 1.26 mg/dL — ABNORMAL HIGH (ref 0.44–1.00)
GFR, Estimated: 49 mL/min — ABNORMAL LOW
Glucose, Bld: 108 mg/dL — ABNORMAL HIGH (ref 70–99)
Potassium: 4.1 mmol/L (ref 3.5–5.1)
Sodium: 138 mmol/L (ref 135–145)

## 2024-03-29 LAB — PRO BRAIN NATRIURETIC PEPTIDE: Pro Brain Natriuretic Peptide: 3242 pg/mL — ABNORMAL HIGH

## 2024-03-29 NOTE — Patient Instructions (Addendum)
 Good to see you today!  No medication changes were made   Labs done today, your results will be available in MyChart, we will contact you for abnormal readings.  Please wear compression hose apply on the the morning and off in the evening  Your physician recommends that you schedule a follow-up appointment as scheduled  If you have any questions or concerns before your next appointment please send us  a message through [place] or call our office at [phone].    TO LEAVE A MESSAGE FOR THE NURSE SELECT OPTION 2, PLEASE LEAVE A MESSAGE INCLUDING: YOUR NAME DATE OF BIRTH CALL BACK NUMBER REASON FOR CALL**this is important as we prioritize the call backs  YOU WILL RECEIVE A CALL BACK THE SAME DAY AS LONG AS YOU CALL BEFORE 4:00 PM At the [facility], you and your health needs are our priority. As part of our continuing mission to provide you with exceptional heart care, we have created designated Provider Care Teams. These Care Teams include your primary Cardiologist (physician) and Advanced Practice Providers (APPs- Physician Assistants and Nurse Practitioners) who all work together to provide you with the care you need, when you need it.   You may see any of the following providers on your designated Care Team at your next follow up: Dr Toribio Fuel Dr Ezra Shuck Dr. Morene Brownie Greig Mosses, NP Caffie Shed, [place] [facility] [place], [place] Beckey Coe, NP Jordan Lee, NP Ellouise Class, NP Tinnie Redman, PharmD Jaun Bash, PharmD   Please be sure to bring in all your medications bottles to every appointment.    Thank you for choosing [facility]

## 2024-04-01 ENCOUNTER — Telehealth (HOSPITAL_COMMUNITY): Payer: Self-pay

## 2024-04-01 DIAGNOSIS — I5022 Chronic systolic (congestive) heart failure: Secondary | ICD-10-CM

## 2024-04-01 MED ORDER — ROSUVASTATIN CALCIUM 40 MG PO TABS: 40.0000 mg | ORAL_TABLET | Freq: Every day | ORAL | 1 refills | Status: AC

## 2024-04-01 NOTE — Telephone Encounter (Signed)
 Spoke to patient regarding lab results. She has been taking crestor  20 mg daily. We are increasing crestor  to 40 mg daily. Follow up lipids and lfts scheduled.

## 2024-04-27 ENCOUNTER — Encounter

## 2024-04-29 ENCOUNTER — Ambulatory Visit: Admitting: Pulmonary Disease

## 2024-05-13 ENCOUNTER — Ambulatory Visit (HOSPITAL_COMMUNITY)

## 2024-07-11 ENCOUNTER — Ambulatory Visit (HOSPITAL_COMMUNITY): Admitting: Cardiology

## 2024-07-27 ENCOUNTER — Encounter

## 2024-07-28 ENCOUNTER — Ambulatory Visit: Admitting: Gastroenterology

## 2024-10-26 ENCOUNTER — Encounter
